# Patient Record
Sex: Female | Born: 1935 | Race: White | Hispanic: No | Marital: Married | Smoking: Never smoker
Health system: Southern US, Community
[De-identification: ages and names within clinical notes are randomized; demographics above are authoritative.]

## PROBLEM LIST (undated history)

## (undated) DIAGNOSIS — I509 Heart failure, unspecified: Secondary | ICD-10-CM

## (undated) DIAGNOSIS — I10 Essential (primary) hypertension: Secondary | ICD-10-CM

## (undated) DIAGNOSIS — L03116 Cellulitis of left lower limb: Secondary | ICD-10-CM

## (undated) DIAGNOSIS — L02416 Cutaneous abscess of left lower limb: Secondary | ICD-10-CM

## (undated) DIAGNOSIS — J449 Chronic obstructive pulmonary disease, unspecified: Secondary | ICD-10-CM

## (undated) DIAGNOSIS — E785 Hyperlipidemia, unspecified: Secondary | ICD-10-CM

## (undated) DIAGNOSIS — H409 Unspecified glaucoma: Secondary | ICD-10-CM

## (undated) HISTORY — PX: BACK SURGERY: SHX140

## (undated) HISTORY — PX: OTHER SURGICAL HISTORY: SHX169

---

## 1997-04-27 HISTORY — PX: SHOULDER ARTHROSCOPY WITH ROTATOR CUFF REPAIR: SHX5685

## 1998-04-27 HISTORY — PX: CHOLECYSTECTOMY: SHX55

## 1999-07-29 ENCOUNTER — Other Ambulatory Visit: Admission: RE | Admit: 1999-07-29 | Discharge: 1999-07-29 | Payer: Self-pay | Admitting: Internal Medicine

## 1999-11-10 ENCOUNTER — Emergency Department (HOSPITAL_COMMUNITY): Admission: EM | Admit: 1999-11-10 | Discharge: 1999-11-10 | Payer: Self-pay | Admitting: Podiatry

## 1999-11-10 ENCOUNTER — Encounter: Payer: Self-pay | Admitting: *Deleted

## 1999-11-20 ENCOUNTER — Encounter: Payer: Self-pay | Admitting: General Surgery

## 1999-11-24 ENCOUNTER — Ambulatory Visit (HOSPITAL_COMMUNITY): Admission: RE | Admit: 1999-11-24 | Discharge: 1999-11-25 | Payer: Self-pay | Admitting: General Surgery

## 1999-11-24 ENCOUNTER — Encounter: Payer: Self-pay | Admitting: General Surgery

## 2000-08-12 ENCOUNTER — Other Ambulatory Visit: Admission: RE | Admit: 2000-08-12 | Discharge: 2000-08-12 | Payer: Self-pay | Admitting: Internal Medicine

## 2001-11-25 ENCOUNTER — Other Ambulatory Visit: Admission: RE | Admit: 2001-11-25 | Discharge: 2001-11-25 | Payer: Self-pay | Admitting: Internal Medicine

## 2004-03-04 ENCOUNTER — Other Ambulatory Visit: Admission: RE | Admit: 2004-03-04 | Discharge: 2004-03-04 | Payer: Self-pay | Admitting: Internal Medicine

## 2004-03-17 ENCOUNTER — Ambulatory Visit (HOSPITAL_COMMUNITY): Admission: RE | Admit: 2004-03-17 | Discharge: 2004-03-17 | Payer: Self-pay | Admitting: Internal Medicine

## 2004-06-23 ENCOUNTER — Encounter: Admission: RE | Admit: 2004-06-23 | Discharge: 2004-06-23 | Payer: Self-pay | Admitting: *Deleted

## 2004-06-27 ENCOUNTER — Ambulatory Visit (HOSPITAL_BASED_OUTPATIENT_CLINIC_OR_DEPARTMENT_OTHER): Admission: RE | Admit: 2004-06-27 | Discharge: 2004-06-27 | Payer: Self-pay | Admitting: *Deleted

## 2004-06-27 ENCOUNTER — Ambulatory Visit (HOSPITAL_COMMUNITY): Admission: RE | Admit: 2004-06-27 | Discharge: 2004-06-27 | Payer: Self-pay | Admitting: *Deleted

## 2004-06-27 ENCOUNTER — Encounter (INDEPENDENT_AMBULATORY_CARE_PROVIDER_SITE_OTHER): Payer: Self-pay | Admitting: Specialist

## 2004-07-14 ENCOUNTER — Encounter: Admission: RE | Admit: 2004-07-14 | Discharge: 2004-10-12 | Payer: Self-pay | Admitting: Surgery

## 2005-04-27 HISTORY — PX: HERNIA REPAIR: SHX51

## 2005-05-22 ENCOUNTER — Encounter: Admission: RE | Admit: 2005-05-22 | Discharge: 2005-05-22 | Payer: Self-pay | Admitting: Internal Medicine

## 2005-06-03 ENCOUNTER — Encounter: Admission: RE | Admit: 2005-06-03 | Discharge: 2005-06-03 | Payer: Self-pay | Admitting: Internal Medicine

## 2007-05-24 ENCOUNTER — Encounter: Admission: RE | Admit: 2007-05-24 | Discharge: 2007-05-24 | Payer: Self-pay | Admitting: Internal Medicine

## 2007-08-15 ENCOUNTER — Ambulatory Visit (HOSPITAL_COMMUNITY): Admission: RE | Admit: 2007-08-15 | Discharge: 2007-08-15 | Payer: Self-pay | Admitting: Gastroenterology

## 2008-04-13 ENCOUNTER — Ambulatory Visit (HOSPITAL_COMMUNITY): Admission: RE | Admit: 2008-04-13 | Discharge: 2008-04-15 | Payer: Self-pay | Admitting: General Surgery

## 2008-12-05 ENCOUNTER — Encounter: Admission: RE | Admit: 2008-12-05 | Discharge: 2008-12-05 | Payer: Self-pay | Admitting: Internal Medicine

## 2010-09-09 NOTE — Op Note (Signed)
[name], [name]               ACCOUNT NO.:  [id number]   MEDICAL RECORD NO.:  [id number]          PATIENT TYPE:  AMB   LOCATION:  ENDO                         FACILITY:  [facility]   PHYSICIAN:  Anselmo Rod, M.D.  DATE OF BIRTH:  10/09/1930   DATE OF PROCEDURE:  08/15/2007  DATE OF DISCHARGE:  08/15/2007                               OPERATIVE REPORT   PROCEDURE PERFORMED:  Colonoscopy with snare polypectomy x 1.   ENDOSCOPIST:  Anselmo Rod, M.D.   INSTRUMENT USED:  Pentax video colonoscope.   INDICATIONS FOR PROCEDURE:  A 77 year old white female undergoing a  screening colonoscopy to rule out colonic polyps, masses, etc. The  patient is awaiting surgery for an umbilical hernia repair and therefore  preoperative colonoscopy is being done.   PREPROCEDURE PREPARATION:  Informed consent was procured from the  patient.  The patient fasted for 8 hours prior to the procedure and  prepped with a bottle of magnesium citrate and a gallon of NuLYTELY the  night prior to procedure.  Risks and benefits of the procedure including  a 10% missed rate of cancer and polyps were discussed with the patient  as well.   PREPROCEDURE PHYSICAL:  The patient had stable vital signs.  NECK:  Supple.  CHEST:  Clear to auscultation.  S1, S2,regular.  ABDOMEN:  Soft, morbidly obese, with a large reducible umbilical hernia  present.  Normal abdominal bowel sounds appreciated, no evidence of  hepatosplenomegaly.   DESCRIPTION OF THE PROCEDURE:  The patient was placed in the left  lateral decubitus position and sedated with 100 mcg of Fentanyl and 10  mg of Versed given intravenously in slow incremental doses.  Once the  patient was adequately sedated and maintained on low-flow oxygen and  continuous cardiac monitoring, the Pentax video colonoscope was advanced  from the rectum to the hepatic flexure with extreme difficulty because  of the patient's body habitus.  The patient's position was changed  from  the left lateral to the supine in the right lateral position with gentle  application of abdominal pressure to reach the hepatic flexure.  Scattered diverticulosis was noted.  A lobulated polyp was snared from  the rectum at the anal verge.  In spite of multiple maneuvers, I could  not reach the cecum.  There was some stool in the right colon as well.  The patient tolerated the procedure well without immediate  complications.   IMPRESSION:  1. Small lobulated polyp snared from the rectum at the anal verge.  2..  Scattered diverticulosis.  1. Procedure aborted at the hepatic flexure.  The right colon not      visualized.   RECOMMENDATIONS:  1. Await pathology results.  2. Avoid all nonsteroidals including aspirin for the next 2 weeks.  3. Repeat colonoscopy within the next 6 months to 1 year after the      patient has had umbilical hernia repair.  4. Outpatient follow-up as need arises in the future.  Brochures on      diverticulosis and a high-fiber diet have been given to the patient  for education.      Anselmo Rod, M.D.  Electronically Signed     JNM/MEDQ  D:  08/18/2007  T:  08/18/2007  Job:  [id number]   cc:   Massie Maroon, MD  Fax: [phone]   Lendon Colonel, MD   Cherylynn Ridges, M.D.  [address], [address]  [place]  [place] [address]

## 2010-09-09 NOTE — Op Note (Signed)
NAMEDYMPHNA, WADLEY               ACCOUNT NO.:  [id number]   MEDICAL RECORD NO.:  [id number]          PATIENT TYPE:  OIB   LOCATION:  5128                         FACILITY:  [facility]   PHYSICIAN:  Cherylynn Ridges, M.D.    DATE OF BIRTH:  10/09/1930   DATE OF PROCEDURE:  04/13/2008  DATE OF DISCHARGE:                               OPERATIVE REPORT   PREOPERATIVE DIAGNOSIS:  Ventral hernia.   POSTOPERATIVE DIAGNOSIS:  5 x 10 cm ventral hernia.   PROCEDURE:  Laparoscopic ventral hernia repair with Proceed 15 x 20 cm  mesh.   SURGEON:  Cherylynn Ridges, MD   ASSISTANT:  Juanetta Gosling, MD   ANESTHESIA:  General endotracheal.   ESTIMATED BLOOD LOSS:  Less than 50 mL.   COMPLICATIONS:  None.   CONDITION:  Stable.   SPECIMENS:  No specimens were sent.   INDICATIONS FOR OPERATION:  The patient is a 77 year old with a postop  ventral hernia who comes in for repair.   FINDINGS:  The patient had a 5 x 10 cm defect with incarcerated omentum  which was reduced at the time of surgery.   OPERATION:  The patient was taken to the operating room and placed on  the table in supine position.  After an adequate general endotracheal  anesthetic was administered, she was prepped and draped in the usual  sterile manner exposing the entire abdomen.   A right upper quadrant transverse incision about 1.5 cm long was made  into the subcutaneous tissue.  Using an OptiVu at the [facility] cannula and  trocar with attached camera light source we were able to penetrate into  the peritoneal cavity safely, then insufflate into the peritoneal cavity  with carbon dioxide gas up to a maximal intra-abdominal pressure of 15  mmHg.  Once we were clearly in the peritoneal cavity, we were running  into some omentum in that area.  We placed a second cannula in the left  lower quadrant where we were able to use a dissector grasper and also in  the left midportion of the abdomen and in order to use a grasper to  dissect out and remove the omentum from the hernia defect.   We used a Art gallery manager where we sort of cauterized into the hernia  sac and then bluntly dissected out the omentum into the peritoneal  cavity with minimal bleeding.   Once we had completely reduced the omentum from the hernia defect, we  measured the size of the defect using a spinal needle at the edge of the  hernia fascial defect.  It measured approximately 5 x 10 cm in size and  we used a 15 x 20 cm piece of Proceed mesh with the cellulose coated  side on the abdominal side and a core side towards the abdominal wall.  We marked the mesh with a marking pen A, B, C and X.  A was at the upper  portion of the wound.  We placed simple stitches of old Novafil in the  mesh at the four main spots and then between each of  those.  We placed a  second stitch for a total of eight stitches throughout the mesh.   Once we had adequate orientation, we passed the mesh into the peritoneal  cavity through the left upper quadrant of the cannula site.  We then  retrieved the sutures using a suture retriever through the marked spots  on eight areas equally spaced on the abdominal wall.  After we had all  sutures pulled up to the abdominal wall, we pulled it up and tied down  the stitch and then cut them into the subcutaneous tissue.  It was a  little bit loose along the inferior portion, however, we were able to  tack this down using a tacker.  A total of about 35-40 packs were placed  in the mesh to circumferentially tack down tensely.   Once we had adequate position of the mesh with good apposition to the  hernia defect, we removed all cannulas, aspirated all gas and irrigated  with small amount of saline.   Once all cannulas were removed, we closed the skin at the left upper  quadrant site using a running subcuticular stitch of 4-0 Vicryl.  Marcaine was injected at all sites.  A total of about just 15 sites were  used.  Most of them  were closed with Dermabond with the exception of the  left upper quadrant 11-mm trocar site.  We then placed Steri-Strips and  Tegaderm to all incisions.  Abdominal binder was placed in the patient  prior to awakening.  All counts were correct.      Cherylynn Ridges, M.D.  Electronically Signed     JOW/MEDQ  D:  04/13/2008  T:  04/13/2008  Job:  [id number]   cc:   Anselmo Rod, M.D.

## 2010-09-12 NOTE — H&P (Signed)
[place]. [facility]  Patient:    [name], [name]                        MRN: [id number] Attending:  Jimmye Norman, M.D.                         History and Physical  ADDENDUM:  Her first symptoms occurred approximately a month ago while in [place], where she lived, and when she went in to the emergency room she was found to have gallstones.  She has been asymptomatic until the day prior to when she came in to my office for evaluation.  She has had no jaundice, no constipation, no diarrhea, no abnormal stools or bowel movements, and no abnormal urine.  PAST MEDICAL HISTORY:  Significant for hypertension and diabetes.  MEDICATIONS:  She takes several medications including Glucovan b.i.d.; Avapro 1 q.d.; and Indapamide 1 q.d. also.  She is also taking Ciprofloxacin and Vicodin at the time she was evaluated in my office on the 17th.  ALLERGIES:  She is intolerant to CODEINE.  She has vomiting.  She has no other known drug allergies.  PHYSICAL EXAMINATION:  VITAL SIGNS:  Her blood pressure was 130/80, pulse was 80, respirations 16. She was afebrile with a temperature of 97.9.  GENERAL:  She was not jaundice.  HEENT:  She was normocephalic and atraumatic.  She is anicteric.  NECK:  Supple.  She has no palpable masses nor bruits.  CHEST:  Clear to auscultation.  CARDIOVASCULAR:  Regular rhythm and rate with no murmurs, no gallops, no lifts, or heaves.  ABDOMEN:  Mildly distended but had good bowel sounds.  Nontender completely throughout the abdomen with no rebound or guarding.  RECTAL:  Deferred.  PELVIC:  Deferred.  LABORATORY DATA:  Will be done preoperatively and from her visit admission [facility], her hematocrit was 40%.  Her lipase was over 2000 at 2216.  It is not known why the patient was not admitted at that time for cholecystectomy.  No other lab studies are noted at this time.  EKG was normal.  IMPRESSION:  Symptomatic  cholelithiasis, possible gallstone pancreatitis in a diabetic female with hypertension.  PLAN:  The plan is to perform a laparoscopic cholecystectomy, possible open procedure based on the patients history of pain in the epigastrium in right upper quadrant.  She will get preoperative antibiotics. DD:  11/24/99 TD:  11/24/99 Job: [id number]

## 2010-09-12 NOTE — Op Note (Signed)
[place]. [facility]  Patient:    [name]                       MRN: [id number] Proc. Date: 11/24/99 Adm. Date:  [id number] Disc. Date: [id number] Attending:  Cherylynn Ridges                           Operative Report  PREOPERATIVE DIAGNOSIS:   Symptomatic cholelithiasis and gallstone pancreatitis.  POSTOPERATIVE DIAGNOSIS:  Symptomatic cholelithiasis and gallstone pancreatitis.  PROCEDURE:  Laparoscopic cholecystectomy with intraoperative cholangiogram.  SURGEON:  Jimmye Norman, M.D.  ASSISTANT:  Zigmund Daniel, M.D.  ANESTHESIA:  General endotracheal anesthesia.  ESTIMATED BLOOD LOSS:  Less than 30 cc.  COMPLICATIONS:  None.  FINDINGS:  Cholangiogram showed some slow flow into the duodenum but it did flow with no obvious stone as a filling defect although there was a lucency in the distal duct which may have represented some inflammation of the distal duct in surgery.  Low flow into the pancreatic duct.  No obvious filling defect.  The patient had pigmentous stones on opening the gallbladder.  SPECIMEN:  Gallbladder.  INDICATIONS:  The patient is a 77 year-old female with a month history of abdominal pain now in the right upper quadrant and previously in the upper mid-back.  She now comes in for elective laparoscopic cholecystectomy.  PROCEDURE IN DETAIL:  The patient was taken to the operating room and placed on the operating room table in the supine position.  After an adequate endotracheal anesthetic was administered she was prepped and draped in the usual sterile fashion exposing the midline of the abdomen.  A supraumbilical curvilinear incision was made using a #11 blade and taken down to the midline fascia.  It was noted that the patient had a small umbilical hernia defect which was repaired along with closure of the supraumbilical site.  The Veress needle was passed through the peritoneum at the umbilical hernia defect  and subsequently confirmed to be in adequate position using the saline test.  Once this was done, carbon dioxide insufflation was instilled into the peritoneal cavity up to a maximum intra-abdominal pressure of 15 mmHg.  Once this was done the patient was placed in more steep reverse Trendelenburg position.  Two right costal margin 5 mm cannulas and a subxiphoid 10 - 11 mm cannula were passed into the peritoneal cavity under direct visualization.  The supraumbilical cannula was passed initially and confirmed to be in position with the laparoscope as attached camera light source.  With all cannulas in place, the patient was placed in more steep reverse Trendelenburg position. Her left side was tilted down.  The dissection was begun.  There were adhesions of the right lateral side of the gallbladder to the liver bed which were torn during retraction into the right upper quadrant which caused a small amount of bleeding from the liver surface in that area but this was controllable with the electrocautery.  We retracted the gallbladder into the right upper quadrant and the infundibulum was grasped with a second grasper and then the peritoneum overlying the triangle of Calot and the hepatoduodenal triangle using dissectors.  We bluntly dissected out the cystic duct and cystic artery in this area and encircled them.  We placed a proximal clip on the cystic duct with the exception of the cholecystoduchotomy for the cholangiogram.  The cystic artery was dissected out with an  endoclip proximally and distally with minimal difficulty.  Through the cholecystoduchotomy, a catheter was passed into the peritoneal cavity after passing through the anterior abdominal wall using a #14 gauge needle.  Using the catheter, the cholangiogram which was described previously was done which showed flow into the duodenum although slow and reflux into the pancreatic duct.  There was no obvious stone as a filling  defect.  Once the cholangiogram was done, we removed the catheter and the device was used to hold it in place.  We distally ligated the cystic duct, transected it, and then subsequently, after transecting the cystic artery with flips in between, dissected the gallbladder out of its bed with minimal difficulty. There was a rupture of the gallbladder during this process with spillage of clear bile but no stones.  Once the gallbladder was completely detached, we obtained adequate hemostasis using electrocautery.  We removed the gallbladder from the supraumbilical fascia with minimal difficulty.  We saw subsequently that there were pigmentous stones measuring 1 to 3 mm in size.  Once the gallbladder was removed, we irrigated the right upper lobe and the gallbladder bed with saline- up to a liter of saline was used.  There was minimal bleeding.  We let out the gas and all ports were removed from the suction catheter apparatus.  Once this was done, we removed our cannulas.  The supraumbilical fascia site was reapproximated using figure-of-eight stitch of 0 Vicryl.  This was passed on a UR-6 needle.  We closed all skin sites using running subcuticular stitch of 4-0 Vicryl.  Then 0.25% marcaine with epinephrine was injected at all incision sites.  Sterile Steri-Strips were applied to the wounds with Betadine ointment and a 2 x 2 dressing.  The needle and sponge count and instrument count were correct at the conclusion of the case. DD:  11/24/99 TD:  11/25/99 Job: [id number]

## 2010-09-12 NOTE — Discharge Summary (Signed)
[place]. [facility]  Patient:    [name]                       MRN: [id number] Adm. Date:  [id number] Disc. Date: [id number] Attending:  Cherylynn Ridges                           Discharge Summary  DISCHARGE DIAGNOSIS:  Symptomatic cholelithiasis with possible past history of gallstone pancreatitis.  DISCHARGE MEDICATIONS:  Vicodin 1-2 tablets q. 4h p.r.n. for pain.  DIET ON DISCHARGE:  ADA diet, as the was taking preoperatively.  She continue to take her preoperative medications, in addition, she is given Vicodin for pain control.  CONDITION ON DISCHARGE:  Stable.  DISCHARGE INSTRUCTIONS:  Activity level is unlimited.  She can ambulate and do other activities as tolerated.  FOLLOW-UP:  She is to follow-up to see me in 2 weeks.  Her wound should be allowed to remain covered for 2 days, and then she can remove the outer dressing and shower and pat her wounds dry.  BRIEF SUMMARY OF HOSPITAL COURSE:  The patient is a 77 year old female admitted for an elective laparoscopic cholecystectomy after having a gallstone attack in [place].  She had an intraoperative cholangiogram which showed some distal ductal slow-filling, no actual defect flow into the duodenum and normal anatomy otherwise.  She was discharged home after a laparoscopic cholecystectomy.  She is eating well, good bowel sounds, no fever and blood pressure well-controlled.  FOLLOW-UP:  She will follow-up to see me in 2 weeks time. DD:  11/25/99 TD:  11/25/99 Job: [id number]

## 2010-09-12 NOTE — Op Note (Signed)
NAMEHENNIE, [name]               ACCOUNT NO.:  [id number]   MEDICAL RECORD NO.:  [id number]          PATIENT TYPE:  AMB   LOCATION:  [facility]                         FACILITY:  [facility]   PHYSICIAN:  Pershing Cox, M.D.DATE OF BIRTH:  18-Sep-1930   DATE OF PROCEDURE:  DATE OF DISCHARGE:  06/27/2004                                 OPERATIVE REPORT   PREOPERATIVE DIAGNOSIS:  Abnormal endometrium on sonogram with normal  endometrial biopsy.   POSTOPERATIVE DIAGNOSIS:  Solitary endometrial polyp.   OPERATION/PROCEDURE:  1.  Examination under anesthesia.  2.  Fractional dilatation and curettage.  3.  Hysteroscopy.   SURGEON:  Pershing Cox, M.D.   ASSISTANT:  None.   ANESTHESIA:  General with LMA and paracervical block.   INDICATIONS:  This patient is 77 years old and was referred by Dr. Lendell Caprice  to my office after she had an abnormal sonogram performed at [facility].  This showed an endometrial thickening, 13 mm in  size. A GYN consult was recommended and she came to my office and  endometrial biopsy was performed which showed normal endometrium and  fragments of an endometrial polyp with simple hyperplasia.  No atypia.  For  this reason, the patient was brought to the operating room today for the  [facility], hysteroscopy.   OPERATIVE FINDINGS:  Examination under anesthesia shows the uterus to be  anteverted.  No adnexal masses palpated.  The endometrial canal sounds to a  depth of 7 cm.  There was a solitary endometrial polyp found on the mid  right wall of the endometrium.  The remainder of the endometrium was  atrophic.   DESCRIPTION OF PROCEDURE:  [name] was brought to the operating room  with an IV in place.  She received 1 g of Ancef in the holding area in  preparation for the surgery.  She had been counseled regarding the risk of  perforation and infection and accepted the risks of the procedure.   The patient was taken to the operating  room where supine on the OR table,  LMA was introduced and used for anesthesia throughout the procedure.  The  patient was then placed into Allen stirrups and positioned at the end of the  bed.  She was prepped with a solution of Betadine.  Bivalve speculum was  inserted into the vagina and the cervix was grasped with a single-tooth  tenaculum.  Marcaine 0.25% was injected into the cervical tissues at the 3,  4, 7, and 8 positions.  Kevorkian curet was used to obtain endocervical  curettings.  The uterus sound then passed to a depth of 7 cm.  Serial Pratt  dilators were used to dilate to size 25 and the diagnostic scope was  introduced.  Using through-and-through sorbitol irrigation, the cavity was  visualized.  A solitary polyp was seen in the midst of otherwise atrophic  endometrium and a decision was made to use a small, sharp curet to try to  curet out the pieces rather than further dilating the cervix and using the  resectoscope with its possible risks.  Small sharp curet was introduced into the cavity and using brisk curettage  of the right wall, then serial curettage of the remaining wall, fragments of  the polyp were retrieved onto a Telfa.  The diagnostic hysteroscope was  introduced. All of the polyp had been removed.  The Meigs curet was then  used to serially curet the remaining walls onto another Telfa. The patient  tolerated the procedure without difficulty and was taken to the recovery  room in good condition.   FINAL DIAGNOSIS:  Endometrial polyp.      MAJ/MEDQ  D:  06/27/2004  T:  06/27/2004  Job:  [id number]   cc:   Janae Bridgeman. Eloise Harman., M.D.  [address]  [place]  [place] [address]  Fax: [phone]

## 2010-10-09 ENCOUNTER — Other Ambulatory Visit (INDEPENDENT_AMBULATORY_CARE_PROVIDER_SITE_OTHER): Payer: Self-pay | Admitting: General Surgery

## 2010-10-31 ENCOUNTER — Other Ambulatory Visit: Payer: Self-pay

## 2010-11-14 ENCOUNTER — Ambulatory Visit
Admission: RE | Admit: 2010-11-14 | Discharge: 2010-11-14 | Disposition: A | Discharge status: Home / Self Care | Payer: Medicare Other | Source: Ambulatory Visit | Attending: General Surgery | Admitting: General Surgery

## 2010-11-14 MED ORDER — IOHEXOL 300 MG/ML  SOLN
125.0000 mL | Freq: Once | INTRAMUSCULAR | Status: AC | PRN
  Administered 2010-11-14: 125 mL via INTRAVENOUS

## 2010-12-05 ENCOUNTER — Emergency Department (HOSPITAL_COMMUNITY): Payer: Medicare Other

## 2010-12-05 ENCOUNTER — Encounter (HOSPITAL_COMMUNITY): Payer: Self-pay | Admitting: Radiology

## 2010-12-05 ENCOUNTER — Observation Stay (HOSPITAL_COMMUNITY)
Admission: EM | Admit: 2010-12-05 | Discharge: 2010-12-06 | Disposition: A | Discharge status: Home / Self Care | Payer: Medicare Other | Attending: Family Medicine | Admitting: Family Medicine

## 2010-12-05 DIAGNOSIS — N183 Chronic kidney disease, stage 3 unspecified: Secondary | ICD-10-CM | POA: Insufficient documentation

## 2010-12-05 DIAGNOSIS — D696 Thrombocytopenia, unspecified: Secondary | ICD-10-CM

## 2010-12-05 DIAGNOSIS — R0789 Other chest pain: Principal | ICD-10-CM | POA: Insufficient documentation

## 2010-12-05 DIAGNOSIS — E119 Type 2 diabetes mellitus without complications: Secondary | ICD-10-CM | POA: Insufficient documentation

## 2010-12-05 DIAGNOSIS — M25519 Pain in unspecified shoulder: Secondary | ICD-10-CM | POA: Insufficient documentation

## 2010-12-05 DIAGNOSIS — M6282 Rhabdomyolysis: Secondary | ICD-10-CM | POA: Insufficient documentation

## 2010-12-05 DIAGNOSIS — I129 Hypertensive chronic kidney disease with stage 1 through stage 4 chronic kidney disease, or unspecified chronic kidney disease: Secondary | ICD-10-CM | POA: Insufficient documentation

## 2010-12-05 DIAGNOSIS — E785 Hyperlipidemia, unspecified: Secondary | ICD-10-CM | POA: Insufficient documentation

## 2010-12-05 HISTORY — DX: Hyperlipidemia, unspecified: E78.5

## 2010-12-05 HISTORY — DX: Essential (primary) hypertension: I10

## 2010-12-05 LAB — GLUCOSE, CAPILLARY

## 2010-12-05 LAB — CK TOTAL AND CKMB (NOT AT ARMC)
Total CK: 81 U/L (ref 7–177)
Total CK: 112 U/L (ref 7–177)

## 2010-12-05 LAB — URINALYSIS, ROUTINE W REFLEX MICROSCOPIC
Bilirubin Urine: NEGATIVE
Hgb urine dipstick: NEGATIVE
Specific Gravity, Urine: 1.03 (ref 1.005–1.030)
Urobilinogen, UA: 0.2 mg/dL (ref 0.0–1.0)
pH: 5.5 (ref 5.0–8.0)

## 2010-12-05 LAB — CBC
MCH: 29.5 pg (ref 26.0–34.0)
Platelets: 46 10*3/uL — ABNORMAL LOW (ref 150–400)
RBC: 4.31 MIL/uL (ref 3.87–5.11)
WBC: 8.1 10*3/uL (ref 4.0–10.5)

## 2010-12-05 LAB — POCT I-STAT, CHEM 8
HCT: 39 % (ref 36.0–46.0)
Hemoglobin: 13.3 g/dL (ref 12.0–15.0)
Potassium: 4.6 mEq/L (ref 3.5–5.1)
Sodium: 133 mEq/L — ABNORMAL LOW (ref 135–145)

## 2010-12-05 LAB — DIFFERENTIAL
Basophils Relative: 0 % (ref 0–1)
Eosinophils Absolute: 0.2 10*3/uL (ref 0.0–0.7)
Neutrophils Relative %: 75 % (ref 43–77)

## 2010-12-05 LAB — TROPONIN I
Troponin I: <0.3 ng/mL (ref <0.30)
Troponin I: <0.3 ng/mL (ref <0.30)

## 2010-12-05 LAB — BASIC METABOLIC PANEL
Chloride: 95 mEq/L — ABNORMAL LOW (ref 96–112)
Creatinine, Ser: 1.08 mg/dL (ref 0.50–1.10)
GFR calc Af Amer: 59 mL/min — ABNORMAL LOW (ref >60)

## 2010-12-05 LAB — POCT I-STAT TROPONIN I: Troponin i, poc: 0 ng/mL (ref 0.00–0.08)

## 2010-12-05 LAB — D-DIMER, QUANTITATIVE: D-Dimer, Quant: 0.63 ug/mL-FEU — ABNORMAL HIGH (ref 0.00–0.48)

## 2010-12-05 LAB — PLATELET COUNT: Platelets: UNDETERMINED 10*3/uL (ref 150–400)

## 2010-12-05 MED ORDER — IOHEXOL 300 MG/ML  SOLN: 85.0000 mL | Freq: Once | INTRAMUSCULAR | Status: AC | PRN

## 2010-12-06 LAB — GLUCOSE, CAPILLARY
Glucose-Capillary: 160 mg/dL — ABNORMAL HIGH (ref 70–99)
Glucose-Capillary: 157 mg/dL — ABNORMAL HIGH (ref 70–99)

## 2010-12-06 LAB — TROPONIN I: Troponin I: <0.3 ng/mL (ref <0.30)

## 2010-12-06 LAB — HEMOGLOBIN A1C: Mean Plasma Glucose: 171 mg/dL — ABNORMAL HIGH (ref <117)

## 2010-12-06 LAB — CBC
HCT: 33.6 % — ABNORMAL LOW (ref 36.0–46.0)
Hemoglobin: 10.8 g/dL — ABNORMAL LOW (ref 12.0–15.0)
MCHC: 32.1 g/dL (ref 30.0–36.0)
RBC: 3.76 MIL/uL — ABNORMAL LOW (ref 3.87–5.11)

## 2010-12-06 LAB — CK TOTAL AND CKMB (NOT AT ARMC)
Relative Index: 2.7 — ABNORMAL HIGH (ref 0.0–2.5)
Total CK: 100 U/L (ref 7–177)

## 2010-12-09 LAB — SAVE SMEAR

## 2010-12-10 NOTE — Consult Note (Signed)
NAMEEYLA, TALLON NO.:  [id number]  MEDICAL RECORD NO.:  [id number]  LOCATION:  4713                         FACILITY:  [facility]  PHYSICIAN:  Exie Parody, M.D.        DATE OF BIRTH:  10/09/1930  DATE OF CONSULTATION:  12/05/2010 DATE OF DISCHARGE:                                CONSULTATION   CHIEF COMPLAINT:  Right shoulder blade pain.  REASON FOR CONSULTATION:  Thrombocytopenia.  HISTORY OF PRESENT ILLNESS:  [name] is a 77 year old Caucasian woman from [place], [place].  She claimed that she has had history of thrombocytopenia for many years.  About 15-20 years ago, she was evaluated with a bone marrow biopsy which was negative.  She was told that her thrombocytopnenia is benign.  She has been in her usual state of health until this morning at 4:00 a.m. when she has been having persistent right shoulder pain.  The pain is described as sharp and pressure and does not get worse with breathing or activity.  Workup included a chest x-ray, which I will review myself, which was negative for any acute finding except for intimated subcentimeter nodule.  She had a CT angio on the same day, was negative for PE.  However, this showed pulmonary nodule in right upper and lower lobe, subcentimeter.  She had a right shoulder imaging that was negative for anything except for mild degenerative changes in the shoulder.  Given the thrombocytopenia presentation, she was referred for my evaluation.  [name] was seen in the room by myself today.  She reported that she still has that discomfort in the right shoulder blade.  She denies any pain anywhere else especially an anterior or substernal chest pain.  She denies anorexia, weight loss, palpable lymph node swelling, drenching night sweats, severe headache, visual changes, nausea, vomiting, diaphoresis, dysphagia, odynophagia, and neck swelling.  She denies any hemoptysis, hematemesis, melena, hematochezia,  hematuria/rectal bleeding, skin rash, lower back pain, bowel and bladder incontinence. The rest of the 14-point review of system was negative.  PAST MEDICAL HISTORY: 1. Diabetes mellitus type 2. 2. Hypertension. 3. Hyperlipidemia. 4. Mild COPD.  PAST SURGICAL HISTORY:  Cholecystectomy, possible appendectomy.  She is not aware whether she had that or not.  She had a right rotator cuff repair 20 years ago and lower diskectomy.  CURRENT OUTPATIENT MEDICATIONS: 1. Victoza 0.6 mg daily subcu. 2. Micardis 80/25 one tablet p.o. daily. 3. Advil p.r.n. 4. Xalatan eyedrops. 5. Lipitor 10 mg p.o. at bedtime. 6. Glipizide 5 mg 1-2 tablets daily. 7. Metformin 1000 mg p.o. daily. 8. Actos 30 mg half a tablet p.o. daily.  ALLERGIES:  No known drug allergy.  SOCIAL HISTORY:  The patient used to work in [employer].  She denies history of alcohol or IV drug use, smoking.  She lives with her husband.  FAMILY HISTORY:  Father died at age 80 from natural cause, mother age 62 from unknown cause.  She has five brothers, all of whom are okay except for one with diabetes.  The patient denies any known family history of thrombocytopenia.  PHYSICAL EXAMINATION:  VITAL SIGNS:  Temperature was 97.8, heart rate 84, respiratory  rate 22, blood pressure 149/74, weight 110 kg.  ECOG performance of 0-1. GENERAL:  Mildly obese woman, in no acute distress. HEENT:  There is no scleral icterus.  There is no oral pharyngeal lesion. NECK:  Supple. LYMPHATICS:  Negative for cervical, supraclavicular, or axillary adenopathy. LUNGS:  Clear bilaterally without wheezing or crackles. CARDIOVASCULAR:  Regular rate and rhythm.  S1 and S2 without murmur, rub, or gallop. ABDOMEN:  Soft, mildly obese, nontender, nondistended with hepatosplenomegaly.  There was no pedal edema. NEUROLOGIC:  Nonfocal. SKIN:  Negative for any petechiae, ecchymosis. MUSCULOSKELETAL:  Right showed a blade mildly tender to palpation, however,  without any skin rash, crepitus, erythema, purulent discharge, open wound.  LABORATORY DATA:  WBC 8.1, hemoglobin 13.3, platelet count of 46,000. Differential was normal.  Creatinine 1.2.     I personally reviewedher blood smear today, which showed numerous  platelet clumps.  There were no giant platelets.  There was no  shistocytosis, spherocytosis,rouleaux formation, target cell, teardrop  cells.  There were no blasts.  ASSESSMENT AND PLAN: 1. Right shoulder blade pain mostly musculoskeletal. Management for  further work up per primary team.  2. Diabetes mellitus type 2.  She is on glipizide, metformin, Actos     per PCP. 3. Hypertension.  She is on Micardis with good pressure control. 4. Hyperlipidemia.  She is on statin. 5. Chronic thrombocytopenia, unclear duration:  I discussed with Ms.     Small that most likely this is a spurious thrombocytopenia from     platelet clumps.  This was a evaluated in the past per her report.     To confirm this, I will send for platelet counts by citrate tube.  I     discussed with her that platelet clumps in vitro has no clinical     significance. 6. A subcentimeter lung nodule:  I recommended that the patient be     followed with her PCP and consider a followup a CT scan of her     chest in 1 year to ensure stability of these subcentimeter nodules.     Exie Parody, M.D.     HTH/MEDQ  D:  12/05/2010  T:  12/06/2010  Job:  [id number]  Electronically Signed by Jethro Bolus MD on 12/10/2010 06:49:27 PM

## 2010-12-24 NOTE — Discharge Summary (Signed)
  NAMEKIMIA, FINAN NO.:  [id number]  MEDICAL RECORD NO.:  [id number]  LOCATION:  4713                         FACILITY:  [facility]  PHYSICIAN:  Mauro Kaufmann, MD         DATE OF BIRTH:  10/09/1930  DATE OF ADMISSION:  12/05/2010 DATE OF DISCHARGE:                              DISCHARGE SUMMARY   ADDENDUM  The patient walked in the hallway and checked her O2 sats which has been good 95%, so at this time the patient is not hypoxic, at time of discharge does not have any dyspnea on exertion and is stable for discharge.  This was at 95% on room air after the exertion.  The patient will be given the prescription for physical therapy as outpatient for the right shoulder pain.     Mauro Kaufmann, MD     GL/MEDQ  D:  12/06/2010  T:  12/06/2010  Job:  [id number]  cc:   Dr. Selena Batten  Electronically Signed by Sibyl Parr LAMA  on 12/24/2010 07:30:41 AM

## 2010-12-24 NOTE — Discharge Summary (Signed)
[name], [name] NO.:  [id number]  MEDICAL RECORD NO.:  [id number]  LOCATION:  4713                         FACILITY:  [facility]  PHYSICIAN:  Mauro Kaufmann, MD         DATE OF BIRTH:  10/09/1930  DATE OF ADMISSION:  12/05/2010 DATE OF DISCHARGE:  12/06/2010                              DISCHARGE SUMMARY   ADMISSION DIAGNOSES: 1. Right shoulder pain likely musculoskeletal. 2. History of right shoulder cuff repair. 3. Thrombocytopenia. 4. Diabetes mellitus. 5. Hypertension. 6. Hyperlipidemia.  DISCHARGE DIAGNOSES: 1. Atypical chest pain, resolved. 2. Right  shoulder pain likely musculoskeletal. 3. Chronic thrombocytopenia. 4. Diabetes mellitus. 5. Hypertension. 6. Chronic kidney disease.  TESTS PERFORMED DURING THE HOSPITAL STAY: 1. Chest x-ray on August 10, which showed no definite evidence of     acute cardiopulmonary disease on the AP exam. 2. Right shoulder x-ray showed mild degenerative changes in the right     shoulder, postsurgical changes in right shoulder. 3. CT angio of the chest was done yesterday which showed no evidence     of pulmonary embolism.  No thoracic aortic dissection.  There was     indeterminate 4 mm pulmonary nodule in the right upper and lower     lobes.  A follow-up recommend CT in 1 year.  CONSULTS OBTAINED DURING HOSPITAL STAY:  Hematology/Oncology consultation.  BRIEF HISTORY AND PHYSICAL:  This is a 77 year old female, who came to the ER with right shoulder pain, which awakened from sleep.  The patient also had increasing D-dimer and a CT of angiogram did not show any evidence of PE.  The patient was also found to have chronic thrombocytopenia.  She was admitted to the hospital for further evaluation to make sure that there was no cardiac cause of the pain.  BRIEF HOSPITAL COURSE: 1. Right shoulder pain.  The right shoulder pain at this time is     because of the musculoskeletal as the cardiac enzymes x3 negative  and also the patient has had shoulder surgery in the past.  She     will continue to take Tylenol at home, and if the pain does not     subside the next few days, she will call the primary care physician     and she will benefit from Orthopedics consultations as an     outpatient. 2. Chronic thrombocytopenia.  The patient was seen by Dr. Gaylyn Rong on     hematologist/oncologist in the hospital, who ordered to recheck the     platelets on the citrate tube and the platelets again showing     clumped, but within adequate number.  The patient does not need to     follow up with hematology at this point, and the patient is stable     at this time. 3. The platelet count is adequate though it is spuriously showing     thrombocytopenia and due to the platelet clumps. 4. Hypertension.  The patient will continue to use her medications. 5. Mild hypoxia, resolved.  The patient had normal O2 sats in the     hospital, which was more 92% on August 11 at 10:38 a.m.  Her O2     sats were recorded as 88% on room air.  We rechecked and the     patient's O2 sats right now is 90%.  The patient has no symptoms at     all.  The only medication she was getting was morphine, she got     yesterday, and also she was on p.r.n. hydrocodone.  I am not going     to give the patient any hydrocodone at this time.  Her CT angio has     been negative.  Cardiac enzymes had been negative, so most likely     the hypoxemia which could be secondary to the patient's right     shoulder pain, limiting her breath intake, causing shallow     breathing and also the effect of morphine.  If the patient     continues to have breathing difficulties, she probably would     benefit from a pulmonary evaluation as outpatient to check for     underlying sleep apnea.  The patient also has a history of mild     COPD.  At this time, I do not think the patient is requiring oxygen     at home.  At this time, I do not think the patient will requiring      any oxygen at home. 6. Pulmonary nodule.  The patient will need a repeat CT scan in 1 year     as per her primary care physician to check for the pulmonary     nodule.  MEDICATION ON DISCHARGE: 1. Tylenol 650 q.4 h p.r.n. as needed. 2. Actos 30 mg half tablet p.o. daily. 3. Glipizide 5 mg one to two tablets by mouth daily. 4. Lipitor 10 mg p.o. daily. 5. Metformin 1000 mg p.o. daily. 6. Micardis HCT one tablet p.o. daily. 7. Victoza 6 mg subcutaneously daily. 8. Xalatan one drop each eye at bedtime. 9. Stop taking Advil.     Mauro Kaufmann, MD     GL/MEDQ  D:  12/06/2010  T:  12/06/2010  Job:  [id number]  cc:   Dr. Selena Batten  Electronically Signed by Sibyl Parr LAMA  on 12/24/2010 07:30:33 AM

## 2011-01-29 LAB — GLUCOSE, CAPILLARY
Glucose-Capillary: 134 mg/dL — ABNORMAL HIGH (ref 70–99)
Glucose-Capillary: 135 mg/dL — ABNORMAL HIGH (ref 70–99)
Glucose-Capillary: 165 mg/dL — ABNORMAL HIGH (ref 70–99)
Glucose-Capillary: 218 mg/dL — ABNORMAL HIGH (ref 70–99)

## 2011-01-29 LAB — COMPREHENSIVE METABOLIC PANEL
ALT: 18 U/L (ref 0–35)
AST: 16 U/L (ref 0–37)
Albumin: 3.8 g/dL (ref 3.5–5.2)
Alkaline Phosphatase: 88 U/L (ref 39–117)
CO2: 33 mEq/L — ABNORMAL HIGH (ref 19–32)
Chloride: 99 mEq/L (ref 96–112)
GFR calc Af Amer: >60 mL/min (ref >60)
GFR calc non Af Amer: 51 mL/min — ABNORMAL LOW (ref >60)
Potassium: 4.1 mEq/L (ref 3.5–5.1)
Sodium: 136 mEq/L (ref 135–145)
Total Bilirubin: 0.7 mg/dL (ref 0.3–1.2)

## 2011-01-29 LAB — DIFFERENTIAL
Band Neutrophils: 0 % (ref 0–10)
Blasts: 0 %
Metamyelocytes Relative: 0 %
Monocytes Absolute: 0.5 10*3/uL (ref 0.1–1.0)
Monocytes Relative: 7 % (ref 3–12)
Myelocytes: 0 %
Promyelocytes Absolute: 0 %
nRBC: 0 /100 WBC

## 2011-01-29 LAB — CBC
MCV: 90.6 fL (ref 78.0–100.0)
RBC: 4.08 MIL/uL (ref 3.87–5.11)
WBC: 6.9 10*3/uL (ref 4.0–10.5)

## 2011-02-03 NOTE — H&P (Signed)
NAMEDONNETTE, MACMULLEN NO.:  [id number]  MEDICAL RECORD NO.:  [id number]  LOCATION:  4713                         FACILITY:  [facility]  PHYSICIAN:  Baltazar Najjar, MD     DATE OF BIRTH:  10/09/1930  DATE OF ADMISSION:  12/05/2010 DATE OF DISCHARGE:                             HISTORY & PHYSICAL   PRIMARY CARE PHYSICIAN:  Massie Maroon, MD  CODE STATUS:  Full code.  CHIEF COMPLAINT:  Right shoulder pain.  HISTORY OF PRESENT ILLNESS:  [name] is a 77 year old Caucasian woman who presented to the ER today with right shoulder pain that appeared early this morning, awakened her from sleep.  As per her, she described it as sharp, aching pain, rated it at the highest of 5/10, intermittent, radiating to her right shoulder blade.  The patient stated that she did not notice any change with movement of her shoulder.  She denies any shortness of breath or chest pain.  Denies any diaphoresis, nausea, or dizziness.  Denies any cough or shortness of breath.  In the ED, the patient was found to have mildly elevated D-dimer.  She had a CT angiogram of the chest done that did not show any evidence of PE.  We are asked to admit the patient for rule out acute coronary syndrome by the ED physician.  PAST MEDICAL HISTORY: 1. Hypertension. 2. Hyperlipidemia. 3. Diabetes. 4. COPD.  PAST SURGICAL HISTORY:  History of right rotator cuff repair about 20 years ago.  SOCIAL HISTORY:  Lives with family, with her husband.  Never smoked or drink.  No history of drug use.  ALLERGIES:  No known drug allergies.  HOME MEDICATIONS: 1. Victoza 0.6 mg daily subcutaneously. 2. Micardis 80/25 mg 1 tablet p.o. daily. 3. Advil 200 mg p.o. daily as needed. 4. Xalatan 0.005% ophthalmic each eye 1 drop nightly. 5. Lipitor 10 mg 1 tablet daily. 6. Glipizide 5 mg 1-2 tablets daily. 7. Metformin 1000 mg 1 tablet daily. 8. Actos 30 mg 0.5 tablet daily.  REVIEW OF SYSTEMS:  CHEST:  She  denies any cough or shortness of breath. CARDIOVASCULAR:  Denies any chest pain or dizziness or palpitation. MUSCULOSKELETAL:  Significant for right shoulder pain as above in the HPI.  ABDOMEN:  Denies any nausea, vomiting, or change in bowel habits. GU:  Denies any burning micturition, increasing urinary frequency, or flank pain.  CONSTITUTIONAL:  Denies any fever or chills.  PHYSICAL EXAMINATION:  VITAL SIGNS:  Blood pressure 109/82, heart rate of 81, respiratory rate 22, and O2 sat 98%. GENERAL:  She is alert and oriented x3. NECK:  Supple.  No JVD. LUNGS:  Clear to auscultation bilaterally. CARDIOVASCULAR:  S1 and S2.  Regular rhythm and rate. ABDOMEN:  Obese, soft, and nontender.  Bowel sounds positive. EXTREMITIES:  Trace edema.  Right shoulder, limited range of motion secondary to prior surgery as per the patient.  She does have minimal tenderness in her arm, better with movement of her shoulders. NEUROLOGICAL:  Nonfocal.  LAB RESULTS:  Sodium 133, potassium 4.7, glucose 159, BUN 22, and creatinine 1.08.  CBC showed WBCs of 8.1, hemoglobin 12.7, hematocrit 37.9, and platelet count 46.  D-dimer 0.63.  Troponin-I 0.00.  RADIOLOGY: 1. CT angiogram of the chest showed no evidence of PE.  Indeterminate     4-mm pulmonary nodule in the right upper and lower lobes.  No     evidence of thoracic aortic dissection.  Postsurgical changes of     the right shoulder.  No acute aggressive osseous abnormality with     special attention paid to the right shoulder and scapula. 2. Right shoulder x-ray showed mild degenerative change of the     shoulder, postsurgical change of the right shoulder. 3. Chest x-ray showed no definite evidence of acute cardiopulmonary     disease. 4. EKG showed sinus rhythm, left anterior fascicular block, early     precordial RS transition.  No significant change from previous EKG     in 2009.  ASSESSMENT/PLAN: 1. Right shoulder pain, most likely  musculoskeletal. 2. History of right shoulder rotator cuff repair. 3. Thrombocytopenia, chronic. 4. Diabetes mellitus. 5. Hypertension. 6. Hyperlipidemia.  PLAN: 1. Given description of the pain, no new EKG changes, and negative     troponin, it is very unlikely this is a cardiac origin.  Most     likely, this pain is musculoskeletal in origin. 2. We will continue with pain control. 3. CT of the chest also commented on the right shoulder/scapular area.     There are no osseous abnormalities identified as above. 4. Given her risk factor, we will cycle the cardiac enzymes to     complete the workup, however, as I mentioned above it is very     unlikely. 5. Continue her home medication for diabetes and hypertension. 6. Check hemoglobin A1c. 7. Continue her statins. 8. DVT and GI prophylaxis. 9. PT/OT consults.          ______________________________ Baltazar Najjar, MD     SA/MEDQ  D:  12/05/2010  T:  12/05/2010  Job:  [id number]  Electronically Signed by Hannah Beat MD on 02/03/2011 11:32:31 AM

## 2011-02-03 NOTE — H&P (Signed)
  [name], [name] NO.:  [id number]  MEDICAL RECORD NO.:  [id number]  LOCATION:  4713                         FACILITY:  [facility]  PHYSICIAN:  Baltazar Najjar, MD     DATE OF BIRTH:  10/09/1930  DATE OF ADMISSION:  12/05/2010 DATE OF DISCHARGE:                             HISTORY & PHYSICAL   ADDENDUM  Thrombocytopenia.  As per the patient, she did have workup done in the past including a bone marrow biopsy.  However, she was unable to give me any more details, as per her that was years ago.  I will request Hematology consult while she is in the hospital versus get her an appointment as an outpatient to follow up on her thrombocytopenia.  Chronic kidney disease stage III, stable at baseline.  Probably secondary to diabetic nephropathy or hypertensive nephrosclerosis. This, can also be followed as an outpatient.  Deep vein thrombosis prophylaxis with sequential compression devices.          ______________________________ Baltazar Najjar, MD     SA/MEDQ  D:  12/05/2010  T:  12/05/2010  Job:  [id number]  cc:   Massie Maroon, MD  Electronically Signed by Hannah Beat MD on 02/03/2011 11:31:54 AM

## 2011-05-13 ENCOUNTER — Other Ambulatory Visit: Payer: Self-pay | Admitting: Internal Medicine

## 2011-05-13 DIAGNOSIS — Z1231 Encounter for screening mammogram for malignant neoplasm of breast: Secondary | ICD-10-CM

## 2011-06-30 ENCOUNTER — Ambulatory Visit: Payer: Medicare Other

## 2011-07-09 ENCOUNTER — Ambulatory Visit
Admission: RE | Admit: 2011-07-09 | Discharge: 2011-07-09 | Disposition: A | Discharge status: Home / Self Care | Payer: Medicare Other | Source: Ambulatory Visit | Attending: Internal Medicine | Admitting: Internal Medicine

## 2011-07-09 DIAGNOSIS — Z1231 Encounter for screening mammogram for malignant neoplasm of breast: Secondary | ICD-10-CM

## 2011-07-13 ENCOUNTER — Other Ambulatory Visit: Payer: Self-pay | Admitting: Internal Medicine

## 2011-07-13 DIAGNOSIS — R928 Other abnormal and inconclusive findings on diagnostic imaging of breast: Secondary | ICD-10-CM

## 2011-07-13 DIAGNOSIS — Z124 Encounter for screening for malignant neoplasm of cervix: Secondary | ICD-10-CM | POA: Diagnosis not present

## 2011-07-13 DIAGNOSIS — N898 Other specified noninflammatory disorders of vagina: Secondary | ICD-10-CM | POA: Diagnosis not present

## 2011-07-13 DIAGNOSIS — Z01419 Encounter for gynecological examination (general) (routine) without abnormal findings: Secondary | ICD-10-CM | POA: Diagnosis not present

## 2011-07-13 DIAGNOSIS — N899 Noninflammatory disorder of vagina, unspecified: Secondary | ICD-10-CM | POA: Diagnosis not present

## 2011-07-17 ENCOUNTER — Other Ambulatory Visit: Payer: Self-pay | Admitting: Internal Medicine

## 2011-07-17 ENCOUNTER — Ambulatory Visit
Admission: RE | Admit: 2011-07-17 | Discharge: 2011-07-17 | Disposition: A | Discharge status: Home / Self Care | Payer: Medicare Other | Source: Ambulatory Visit | Attending: Internal Medicine | Admitting: Internal Medicine

## 2011-07-17 DIAGNOSIS — N6310 Unspecified lump in the right breast, unspecified quadrant: Secondary | ICD-10-CM

## 2011-07-17 DIAGNOSIS — R928 Other abnormal and inconclusive findings on diagnostic imaging of breast: Secondary | ICD-10-CM

## 2011-07-17 DIAGNOSIS — N63 Unspecified lump in unspecified breast: Secondary | ICD-10-CM | POA: Diagnosis not present

## 2011-07-28 ENCOUNTER — Ambulatory Visit
Admission: RE | Admit: 2011-07-28 | Discharge: 2011-07-28 | Disposition: A | Discharge status: Home / Self Care | Payer: Medicare Other | Source: Ambulatory Visit | Attending: Internal Medicine | Admitting: Internal Medicine

## 2011-07-28 ENCOUNTER — Other Ambulatory Visit: Payer: Self-pay | Admitting: Internal Medicine

## 2011-07-28 DIAGNOSIS — N6310 Unspecified lump in the right breast, unspecified quadrant: Secondary | ICD-10-CM

## 2011-07-28 DIAGNOSIS — D249 Benign neoplasm of unspecified breast: Secondary | ICD-10-CM | POA: Diagnosis not present

## 2011-07-28 DIAGNOSIS — N63 Unspecified lump in unspecified breast: Secondary | ICD-10-CM | POA: Diagnosis not present

## 2011-08-27 DIAGNOSIS — Z79899 Other long term (current) drug therapy: Secondary | ICD-10-CM | POA: Diagnosis not present

## 2011-08-27 DIAGNOSIS — D518 Other vitamin B12 deficiency anemias: Secondary | ICD-10-CM | POA: Diagnosis not present

## 2011-08-27 DIAGNOSIS — E119 Type 2 diabetes mellitus without complications: Secondary | ICD-10-CM | POA: Diagnosis not present

## 2011-08-27 DIAGNOSIS — E785 Hyperlipidemia, unspecified: Secondary | ICD-10-CM | POA: Diagnosis not present

## 2011-09-02 DIAGNOSIS — I10 Essential (primary) hypertension: Secondary | ICD-10-CM | POA: Diagnosis not present

## 2011-09-02 DIAGNOSIS — M542 Cervicalgia: Secondary | ICD-10-CM | POA: Diagnosis not present

## 2011-09-02 DIAGNOSIS — E669 Obesity, unspecified: Secondary | ICD-10-CM | POA: Diagnosis not present

## 2011-09-02 DIAGNOSIS — M5030 Other cervical disc degeneration, unspecified cervical region: Secondary | ICD-10-CM | POA: Diagnosis not present

## 2011-09-02 DIAGNOSIS — M47812 Spondylosis without myelopathy or radiculopathy, cervical region: Secondary | ICD-10-CM | POA: Diagnosis not present

## 2011-09-02 DIAGNOSIS — E7800 Pure hypercholesterolemia, unspecified: Secondary | ICD-10-CM | POA: Diagnosis not present

## 2011-09-02 DIAGNOSIS — E119 Type 2 diabetes mellitus without complications: Secondary | ICD-10-CM | POA: Diagnosis not present

## 2011-09-07 DIAGNOSIS — H2510 Age-related nuclear cataract, unspecified eye: Secondary | ICD-10-CM | POA: Diagnosis not present

## 2011-09-07 DIAGNOSIS — H4011X0 Primary open-angle glaucoma, stage unspecified: Secondary | ICD-10-CM | POA: Diagnosis not present

## 2011-09-07 DIAGNOSIS — H409 Unspecified glaucoma: Secondary | ICD-10-CM | POA: Diagnosis not present

## 2011-09-07 DIAGNOSIS — E119 Type 2 diabetes mellitus without complications: Secondary | ICD-10-CM | POA: Diagnosis not present

## 2011-09-18 DIAGNOSIS — L570 Actinic keratosis: Secondary | ICD-10-CM | POA: Diagnosis not present

## 2011-09-18 DIAGNOSIS — D235 Other benign neoplasm of skin of trunk: Secondary | ICD-10-CM | POA: Diagnosis not present

## 2011-12-26 DIAGNOSIS — I10 Essential (primary) hypertension: Secondary | ICD-10-CM | POA: Diagnosis not present

## 2011-12-26 DIAGNOSIS — E860 Dehydration: Secondary | ICD-10-CM | POA: Diagnosis not present

## 2011-12-26 DIAGNOSIS — E118 Type 2 diabetes mellitus with unspecified complications: Secondary | ICD-10-CM | POA: Diagnosis not present

## 2011-12-26 DIAGNOSIS — R42 Dizziness and giddiness: Secondary | ICD-10-CM | POA: Diagnosis not present

## 2011-12-26 DIAGNOSIS — D696 Thrombocytopenia, unspecified: Secondary | ICD-10-CM | POA: Diagnosis not present

## 2011-12-26 DIAGNOSIS — N390 Urinary tract infection, site not specified: Secondary | ICD-10-CM | POA: Diagnosis not present

## 2011-12-26 DIAGNOSIS — E1165 Type 2 diabetes mellitus with hyperglycemia: Secondary | ICD-10-CM | POA: Diagnosis not present

## 2011-12-26 DIAGNOSIS — IMO0001 Reserved for inherently not codable concepts without codable children: Secondary | ICD-10-CM | POA: Diagnosis not present

## 2011-12-30 DIAGNOSIS — E119 Type 2 diabetes mellitus without complications: Secondary | ICD-10-CM | POA: Diagnosis not present

## 2011-12-30 DIAGNOSIS — I10 Essential (primary) hypertension: Secondary | ICD-10-CM | POA: Diagnosis not present

## 2011-12-30 DIAGNOSIS — N390 Urinary tract infection, site not specified: Secondary | ICD-10-CM | POA: Diagnosis not present

## 2011-12-30 DIAGNOSIS — R112 Nausea with vomiting, unspecified: Secondary | ICD-10-CM | POA: Diagnosis not present

## 2012-01-01 DIAGNOSIS — D72829 Elevated white blood cell count, unspecified: Secondary | ICD-10-CM | POA: Diagnosis not present

## 2012-01-01 DIAGNOSIS — R112 Nausea with vomiting, unspecified: Secondary | ICD-10-CM | POA: Diagnosis not present

## 2012-01-01 DIAGNOSIS — E119 Type 2 diabetes mellitus without complications: Secondary | ICD-10-CM | POA: Diagnosis not present

## 2012-01-01 DIAGNOSIS — N390 Urinary tract infection, site not specified: Secondary | ICD-10-CM | POA: Diagnosis not present

## 2012-01-06 DIAGNOSIS — R112 Nausea with vomiting, unspecified: Secondary | ICD-10-CM | POA: Diagnosis not present

## 2012-01-13 DIAGNOSIS — I10 Essential (primary) hypertension: Secondary | ICD-10-CM | POA: Diagnosis not present

## 2012-01-13 DIAGNOSIS — R112 Nausea with vomiting, unspecified: Secondary | ICD-10-CM | POA: Diagnosis not present

## 2012-01-13 DIAGNOSIS — M549 Dorsalgia, unspecified: Secondary | ICD-10-CM | POA: Diagnosis not present

## 2012-01-13 DIAGNOSIS — Z23 Encounter for immunization: Secondary | ICD-10-CM | POA: Diagnosis not present

## 2012-01-13 DIAGNOSIS — R197 Diarrhea, unspecified: Secondary | ICD-10-CM | POA: Diagnosis not present

## 2012-01-18 DIAGNOSIS — M5137 Other intervertebral disc degeneration, lumbosacral region: Secondary | ICD-10-CM | POA: Diagnosis not present

## 2012-01-18 DIAGNOSIS — M47817 Spondylosis without myelopathy or radiculopathy, lumbosacral region: Secondary | ICD-10-CM | POA: Diagnosis not present

## 2012-01-18 DIAGNOSIS — G8929 Other chronic pain: Secondary | ICD-10-CM | POA: Diagnosis not present

## 2012-01-18 DIAGNOSIS — IMO0002 Reserved for concepts with insufficient information to code with codable children: Secondary | ICD-10-CM | POA: Diagnosis not present

## 2012-01-18 DIAGNOSIS — Z79899 Other long term (current) drug therapy: Secondary | ICD-10-CM | POA: Diagnosis not present

## 2012-01-19 ENCOUNTER — Other Ambulatory Visit: Payer: Self-pay | Admitting: Physician Assistant

## 2012-01-19 ENCOUNTER — Ambulatory Visit
Admission: RE | Admit: 2012-01-19 | Discharge: 2012-01-19 | Disposition: A | Discharge status: Home / Self Care | Payer: Medicare Other | Source: Ambulatory Visit | Attending: Physician Assistant | Admitting: Physician Assistant

## 2012-01-19 ENCOUNTER — Other Ambulatory Visit: Payer: Self-pay | Admitting: Pain Medicine

## 2012-01-19 DIAGNOSIS — M545 Low back pain, unspecified: Secondary | ICD-10-CM

## 2012-01-19 DIAGNOSIS — M47817 Spondylosis without myelopathy or radiculopathy, lumbosacral region: Secondary | ICD-10-CM | POA: Diagnosis not present

## 2012-01-19 DIAGNOSIS — M25559 Pain in unspecified hip: Secondary | ICD-10-CM

## 2012-01-29 ENCOUNTER — Ambulatory Visit
Admission: RE | Admit: 2012-01-29 | Discharge: 2012-01-29 | Disposition: A | Discharge status: Home / Self Care | Payer: Medicare Other | Source: Ambulatory Visit | Attending: Pain Medicine | Admitting: Pain Medicine

## 2012-01-29 DIAGNOSIS — M545 Low back pain, unspecified: Secondary | ICD-10-CM

## 2012-01-29 DIAGNOSIS — M5137 Other intervertebral disc degeneration, lumbosacral region: Secondary | ICD-10-CM | POA: Diagnosis not present

## 2012-02-01 DIAGNOSIS — M961 Postlaminectomy syndrome, not elsewhere classified: Secondary | ICD-10-CM | POA: Diagnosis not present

## 2012-02-01 DIAGNOSIS — M5137 Other intervertebral disc degeneration, lumbosacral region: Secondary | ICD-10-CM | POA: Diagnosis not present

## 2012-02-01 DIAGNOSIS — M47817 Spondylosis without myelopathy or radiculopathy, lumbosacral region: Secondary | ICD-10-CM | POA: Diagnosis not present

## 2012-02-01 DIAGNOSIS — G894 Chronic pain syndrome: Secondary | ICD-10-CM | POA: Diagnosis not present

## 2012-02-02 DIAGNOSIS — M47817 Spondylosis without myelopathy or radiculopathy, lumbosacral region: Secondary | ICD-10-CM | POA: Diagnosis not present

## 2012-02-23 DIAGNOSIS — T1490XA Injury, unspecified, initial encounter: Secondary | ICD-10-CM | POA: Diagnosis not present

## 2012-03-01 DIAGNOSIS — G894 Chronic pain syndrome: Secondary | ICD-10-CM | POA: Diagnosis not present

## 2012-03-01 DIAGNOSIS — Z79899 Other long term (current) drug therapy: Secondary | ICD-10-CM | POA: Diagnosis not present

## 2012-03-01 DIAGNOSIS — M5137 Other intervertebral disc degeneration, lumbosacral region: Secondary | ICD-10-CM | POA: Diagnosis not present

## 2012-03-01 DIAGNOSIS — M5380 Other specified dorsopathies, site unspecified: Secondary | ICD-10-CM | POA: Diagnosis not present

## 2012-03-01 DIAGNOSIS — IMO0001 Reserved for inherently not codable concepts without codable children: Secondary | ICD-10-CM | POA: Diagnosis not present

## 2012-03-08 DIAGNOSIS — M5380 Other specified dorsopathies, site unspecified: Secondary | ICD-10-CM | POA: Diagnosis not present

## 2012-03-08 DIAGNOSIS — M47817 Spondylosis without myelopathy or radiculopathy, lumbosacral region: Secondary | ICD-10-CM | POA: Diagnosis not present

## 2012-03-15 DIAGNOSIS — H2510 Age-related nuclear cataract, unspecified eye: Secondary | ICD-10-CM | POA: Diagnosis not present

## 2012-03-15 DIAGNOSIS — H4011X0 Primary open-angle glaucoma, stage unspecified: Secondary | ICD-10-CM | POA: Diagnosis not present

## 2012-03-15 DIAGNOSIS — H409 Unspecified glaucoma: Secondary | ICD-10-CM | POA: Diagnosis not present

## 2012-03-18 DIAGNOSIS — D045 Carcinoma in situ of skin of trunk: Secondary | ICD-10-CM | POA: Diagnosis not present

## 2012-03-18 DIAGNOSIS — L570 Actinic keratosis: Secondary | ICD-10-CM | POA: Diagnosis not present

## 2012-03-18 DIAGNOSIS — D0460 Carcinoma in situ of skin of unspecified upper limb, including shoulder: Secondary | ICD-10-CM | POA: Diagnosis not present

## 2012-03-18 DIAGNOSIS — D235 Other benign neoplasm of skin of trunk: Secondary | ICD-10-CM | POA: Diagnosis not present

## 2012-04-05 DIAGNOSIS — M5380 Other specified dorsopathies, site unspecified: Secondary | ICD-10-CM | POA: Diagnosis not present

## 2012-04-05 DIAGNOSIS — Z79899 Other long term (current) drug therapy: Secondary | ICD-10-CM | POA: Diagnosis not present

## 2012-04-05 DIAGNOSIS — IMO0001 Reserved for inherently not codable concepts without codable children: Secondary | ICD-10-CM | POA: Diagnosis not present

## 2012-04-05 DIAGNOSIS — M47817 Spondylosis without myelopathy or radiculopathy, lumbosacral region: Secondary | ICD-10-CM | POA: Diagnosis not present

## 2012-04-05 DIAGNOSIS — M76899 Other specified enthesopathies of unspecified lower limb, excluding foot: Secondary | ICD-10-CM | POA: Diagnosis not present

## 2012-04-13 DIAGNOSIS — M76899 Other specified enthesopathies of unspecified lower limb, excluding foot: Secondary | ICD-10-CM | POA: Diagnosis not present

## 2012-04-13 DIAGNOSIS — G894 Chronic pain syndrome: Secondary | ICD-10-CM | POA: Diagnosis not present

## 2012-04-13 DIAGNOSIS — M47817 Spondylosis without myelopathy or radiculopathy, lumbosacral region: Secondary | ICD-10-CM | POA: Diagnosis not present

## 2012-04-13 DIAGNOSIS — IMO0001 Reserved for inherently not codable concepts without codable children: Secondary | ICD-10-CM | POA: Diagnosis not present

## 2012-04-22 ENCOUNTER — Other Ambulatory Visit: Payer: Self-pay | Admitting: Pain Medicine

## 2012-04-22 DIAGNOSIS — M25552 Pain in left hip: Secondary | ICD-10-CM

## 2012-04-29 ENCOUNTER — Ambulatory Visit
Admission: RE | Admit: 2012-04-29 | Discharge: 2012-04-29 | Disposition: A | Discharge status: Home / Self Care | Payer: Medicare Other | Source: Ambulatory Visit | Attending: Pain Medicine | Admitting: Pain Medicine

## 2012-04-29 DIAGNOSIS — M25552 Pain in left hip: Secondary | ICD-10-CM

## 2012-04-29 DIAGNOSIS — M169 Osteoarthritis of hip, unspecified: Secondary | ICD-10-CM | POA: Diagnosis not present

## 2012-04-29 DIAGNOSIS — M6580 Other synovitis and tenosynovitis, unspecified site: Secondary | ICD-10-CM | POA: Diagnosis not present

## 2012-05-03 DIAGNOSIS — E119 Type 2 diabetes mellitus without complications: Secondary | ICD-10-CM | POA: Diagnosis not present

## 2012-05-03 DIAGNOSIS — I10 Essential (primary) hypertension: Secondary | ICD-10-CM | POA: Diagnosis not present

## 2012-05-04 DIAGNOSIS — G894 Chronic pain syndrome: Secondary | ICD-10-CM | POA: Diagnosis not present

## 2012-05-04 DIAGNOSIS — IMO0001 Reserved for inherently not codable concepts without codable children: Secondary | ICD-10-CM | POA: Diagnosis not present

## 2012-05-04 DIAGNOSIS — M5380 Other specified dorsopathies, site unspecified: Secondary | ICD-10-CM | POA: Diagnosis not present

## 2012-05-04 DIAGNOSIS — M47817 Spondylosis without myelopathy or radiculopathy, lumbosacral region: Secondary | ICD-10-CM | POA: Diagnosis not present

## 2012-05-10 DIAGNOSIS — E119 Type 2 diabetes mellitus without complications: Secondary | ICD-10-CM | POA: Diagnosis not present

## 2012-05-10 DIAGNOSIS — I10 Essential (primary) hypertension: Secondary | ICD-10-CM | POA: Diagnosis not present

## 2012-05-10 DIAGNOSIS — E785 Hyperlipidemia, unspecified: Secondary | ICD-10-CM | POA: Diagnosis not present

## 2012-05-25 DIAGNOSIS — M76899 Other specified enthesopathies of unspecified lower limb, excluding foot: Secondary | ICD-10-CM | POA: Diagnosis not present

## 2012-06-29 DIAGNOSIS — G894 Chronic pain syndrome: Secondary | ICD-10-CM | POA: Diagnosis not present

## 2012-06-29 DIAGNOSIS — IMO0001 Reserved for inherently not codable concepts without codable children: Secondary | ICD-10-CM | POA: Diagnosis not present

## 2012-06-29 DIAGNOSIS — M47817 Spondylosis without myelopathy or radiculopathy, lumbosacral region: Secondary | ICD-10-CM | POA: Diagnosis not present

## 2012-06-29 DIAGNOSIS — Z79899 Other long term (current) drug therapy: Secondary | ICD-10-CM | POA: Diagnosis not present

## 2012-07-04 ENCOUNTER — Telehealth (INDEPENDENT_AMBULATORY_CARE_PROVIDER_SITE_OTHER): Payer: Self-pay | Admitting: *Deleted

## 2012-07-04 NOTE — Telephone Encounter (Signed)
Patient reports that she has previously been evaluated for abdominal hernias but states that within the last 4 days she is now having increased pain at the sites.  Patient reports she is unable to sit or bend.  Appt was made for patient but instructed patient that if pain gets too severe she may want to be evaluated in the ED.

## 2012-07-19 ENCOUNTER — Encounter (INDEPENDENT_AMBULATORY_CARE_PROVIDER_SITE_OTHER): Payer: Self-pay | Admitting: General Surgery

## 2012-07-19 ENCOUNTER — Ambulatory Visit (INDEPENDENT_AMBULATORY_CARE_PROVIDER_SITE_OTHER): Payer: Medicare Other | Admitting: General Surgery

## 2012-07-19 VITALS — BP 132/80 | HR 76 | Temp 98.2°F | Resp 18 | Ht 64.0 in | Wt 238.0 lb

## 2012-07-19 DIAGNOSIS — D1779 Benign lipomatous neoplasm of other sites: Secondary | ICD-10-CM | POA: Diagnosis not present

## 2012-07-19 DIAGNOSIS — D171 Benign lipomatous neoplasm of skin and subcutaneous tissue of trunk: Secondary | ICD-10-CM | POA: Insufficient documentation

## 2012-07-19 NOTE — Progress Notes (Signed)
This very nice lady comes in complaining of right lower back pain associated with an area where she had previous lipomas. She continues to have lipomas in this area but they are not in any way infected or significantly enlarged.  Prior to coming to see me today the patient had entertained having these removed before but it advised her against it since it in an area where she has significant adipose tissue in precise excision is questionable. Also I am not quite sure how much removing the lipoma will affect her intermittent pain.  2 weeks ago when the patient made this appointment to see me she was having significant discomfort in that area which has resolved. I don't think that she requires surgery at this time. The patient was reassured. I will see her back here on a when necessary basis.  Her laparoscopic ventral hernia repair has held up and she is doing well.

## 2012-08-09 DIAGNOSIS — E785 Hyperlipidemia, unspecified: Secondary | ICD-10-CM | POA: Diagnosis not present

## 2012-08-09 DIAGNOSIS — E119 Type 2 diabetes mellitus without complications: Secondary | ICD-10-CM | POA: Diagnosis not present

## 2012-08-15 DIAGNOSIS — E119 Type 2 diabetes mellitus without complications: Secondary | ICD-10-CM | POA: Diagnosis not present

## 2012-09-07 DIAGNOSIS — IMO0001 Reserved for inherently not codable concepts without codable children: Secondary | ICD-10-CM | POA: Diagnosis not present

## 2012-09-07 DIAGNOSIS — M5137 Other intervertebral disc degeneration, lumbosacral region: Secondary | ICD-10-CM | POA: Diagnosis not present

## 2012-09-07 DIAGNOSIS — G894 Chronic pain syndrome: Secondary | ICD-10-CM | POA: Diagnosis not present

## 2012-09-07 DIAGNOSIS — M169 Osteoarthritis of hip, unspecified: Secondary | ICD-10-CM | POA: Diagnosis not present

## 2012-09-13 DIAGNOSIS — E119 Type 2 diabetes mellitus without complications: Secondary | ICD-10-CM | POA: Diagnosis not present

## 2012-09-13 DIAGNOSIS — H409 Unspecified glaucoma: Secondary | ICD-10-CM | POA: Diagnosis not present

## 2012-09-13 DIAGNOSIS — H2510 Age-related nuclear cataract, unspecified eye: Secondary | ICD-10-CM | POA: Diagnosis not present

## 2012-09-13 DIAGNOSIS — H4011X0 Primary open-angle glaucoma, stage unspecified: Secondary | ICD-10-CM | POA: Diagnosis not present

## 2012-09-26 ENCOUNTER — Emergency Department (HOSPITAL_COMMUNITY)
Admission: EM | Admit: 2012-09-26 | Discharge: 2012-09-26 | Disposition: A | Discharge status: Home / Self Care | Payer: Medicare Other | Attending: Emergency Medicine | Admitting: Emergency Medicine

## 2012-09-26 DIAGNOSIS — E119 Type 2 diabetes mellitus without complications: Secondary | ICD-10-CM | POA: Diagnosis not present

## 2012-09-26 DIAGNOSIS — Y9389 Activity, other specified: Secondary | ICD-10-CM | POA: Insufficient documentation

## 2012-09-26 DIAGNOSIS — Z79899 Other long term (current) drug therapy: Secondary | ICD-10-CM | POA: Diagnosis not present

## 2012-09-26 DIAGNOSIS — E785 Hyperlipidemia, unspecified: Secondary | ICD-10-CM | POA: Insufficient documentation

## 2012-09-26 DIAGNOSIS — Y929 Unspecified place or not applicable: Secondary | ICD-10-CM | POA: Insufficient documentation

## 2012-09-26 DIAGNOSIS — I10 Essential (primary) hypertension: Secondary | ICD-10-CM | POA: Diagnosis not present

## 2012-09-26 DIAGNOSIS — S41009A Unspecified open wound of unspecified shoulder, initial encounter: Secondary | ICD-10-CM | POA: Diagnosis not present

## 2012-09-26 DIAGNOSIS — S81809A Unspecified open wound, unspecified lower leg, initial encounter: Secondary | ICD-10-CM | POA: Diagnosis not present

## 2012-09-26 DIAGNOSIS — S71109A Unspecified open wound, unspecified thigh, initial encounter: Secondary | ICD-10-CM | POA: Insufficient documentation

## 2012-09-26 DIAGNOSIS — S71009A Unspecified open wound, unspecified hip, initial encounter: Secondary | ICD-10-CM | POA: Insufficient documentation

## 2012-09-26 DIAGNOSIS — W268XXA Contact with other sharp object(s), not elsewhere classified, initial encounter: Secondary | ICD-10-CM | POA: Insufficient documentation

## 2012-09-26 DIAGNOSIS — I83893 Varicose veins of bilateral lower extremities with other complications: Secondary | ICD-10-CM | POA: Diagnosis not present

## 2012-09-26 DIAGNOSIS — I83891 Varicose veins of right lower extremities with other complications: Secondary | ICD-10-CM

## 2012-09-26 NOTE — ED Provider Notes (Signed)
History     CSN: [id number]  Arrival date & time 09/26/12  1210   First MD Initiated Contact with Patient 09/26/12 1254      Chief Complaint  Patient presents with  . Bleeding/Bruising     Patient is a 79 y.o. female presenting with skin laceration. The history is provided by the patient.  Laceration Location:  Leg Leg laceration location: right lateral thigh. Depth:  Cutaneous Bleeding: venous   Pain details:    Quality:  Aching Worsened by:  Nothing tried pt reports she tried shaving her legs and she cut a varicose vein and began to bleed from her right thigh No other injuries No weakness reported She was unable to control bleeding and EMS was called She is not on anticoagulants  Past Medical History  Diagnosis Date  . Hypertension   . Diabetes mellitus   . Hyperlipidemia     Past Surgical History  Procedure Laterality Date  . Rupture disk  1970"s  . Cholecystectomy  2000  . Shoulder arthroscopy with rotator cuff repair  1999    rt shoulder  . Hernia repair  2007    Family History  Problem Relation Age of Onset  . Stroke Mother     History  Substance Use Topics  . Smoking status: Never Smoker   . Smokeless tobacco: Not on file  . Alcohol Use: Not on file    OB History   Grav Para Term Preterm Abortions TAB SAB Ect Mult Living                  Review of Systems  Constitutional: Negative for fever.  Skin: Positive for wound.    Allergies  Review of patient's allergies indicates no known allergies.  Home Medications   Current Outpatient Rx  Name  Route  Sig  Dispense  Refill  . atorvastatin (LIPITOR) 10 MG tablet   Oral   Take 10 mg by mouth daily.         Marland Kitchen glipiZIDE (GLUCOTROL) 5 MG tablet   Oral   Take 5 mg by mouth daily.          . hydrochlorothiazide (HYDRODIURIL) 25 MG tablet   Oral   Take 25 mg by mouth daily.         Marland Kitchen latanoprost (XALATAN) 0.005 % ophthalmic solution   Both Eyes   Place 1 drop into both eyes at  bedtime.          . Liraglutide (VICTOZA [place])   Subcutaneous   Inject 1 application into the skin daily.         . metFORMIN (GLUCOPHAGE) 1000 MG tablet   Oral   Take 1,000 mg by mouth daily.          . pioglitazone (ACTOS) 30 MG tablet   Oral   Take 15 mg by mouth daily.         Marland Kitchen telmisartan (MICARDIS) 80 MG tablet   Oral   Take 80 mg by mouth daily.         Marland Kitchen glucose blood test strip   Other   1 each by Other route as needed for other. Use as instructed         . Multiple Vitamins-Minerals (MULTIVITAMIN WITH MINERALS) tablet   Oral   Take 1 tablet by mouth daily.           BP 129/61  Pulse 72  SpO2 100%  Physical Exam CONSTITUTIONAL: Well developed/well nourished HEAD:  Normocephalic/atraumatic EYES: EOMI ENMT: Mucous membranes moist NECK: supple no meningeal signs CV: S1/S2 noted, no murmurs/rubs/gallops noted LUNGS: Lungs are clear to auscultation bilaterally, no apparent distress ABDOMEN: soft, nontender, no rebound or guarding NEURO: Pt is awake/alert, moves all extremitiesx4 EXTREMITIES: pulses normal, full ROM Right distal/lateral thigh - small varicose vein that is actively bleeding SKIN: warm, color normal PSYCH: no abnormalities of mood noted  ED Course  Procedures   LACERATION REPAIR Performed by: Joya Gaskins Consent: Verbal consent obtained. Risks and benefits: risks, benefits and alternatives were discussed Patient identity confirmed: provided demographic data Time out performed prior to procedure Prepped and Draped in normal sterile fashion Wound explored Laceration Location: right thigh Laceration Length: less than 0.5 cm No Foreign Bodies seen or palpated Anesthesia: local infiltration Local anesthetic: lidocaine 1% with epinephrine Anesthetic total: 3 ml  Amount of cleaning: standard with betadine Skin closure: simple Number of sutures or staples: 2  Technique: simple interrupted Patient tolerance: Patient  tolerated the procedure well with no immediate complications.   1. Bleeding from varicose vein, right    2 sutures required to control bleeding Pt otherwise stable and no other injuries Discussed need to keep wound covered until tomorrow and will need sutures out later this week   MDM  Nursing notes including past medical history and social history reviewed and considered in documentation         Joya Gaskins, MD 09/26/12 1455

## 2012-09-26 NOTE — ED Notes (Signed)
Per ems pt was shaving and cut a vericose vein on rt leg. Pt unable to stop bleeding.  Pt alert oriented X4.  Vitals , 180 palpated, pulse 80.  NKDA,

## 2012-09-26 NOTE — ED Notes (Signed)
Pt was shaving her leg and cut a vericose vein on outside of rt leg just above knee.  EMS applied pressure dressing and bleeding is controlled.  Pt alert oriented X4

## 2012-11-11 DIAGNOSIS — E119 Type 2 diabetes mellitus without complications: Secondary | ICD-10-CM | POA: Diagnosis not present

## 2012-11-16 DIAGNOSIS — R0609 Other forms of dyspnea: Secondary | ICD-10-CM | POA: Diagnosis not present

## 2012-11-16 DIAGNOSIS — R0989 Other specified symptoms and signs involving the circulatory and respiratory systems: Secondary | ICD-10-CM | POA: Diagnosis not present

## 2012-11-16 DIAGNOSIS — E7800 Pure hypercholesterolemia, unspecified: Secondary | ICD-10-CM | POA: Diagnosis not present

## 2012-11-16 DIAGNOSIS — E119 Type 2 diabetes mellitus without complications: Secondary | ICD-10-CM | POA: Diagnosis not present

## 2012-11-16 DIAGNOSIS — J449 Chronic obstructive pulmonary disease, unspecified: Secondary | ICD-10-CM | POA: Diagnosis not present

## 2012-11-16 DIAGNOSIS — E669 Obesity, unspecified: Secondary | ICD-10-CM | POA: Diagnosis not present

## 2012-11-24 DIAGNOSIS — R0609 Other forms of dyspnea: Secondary | ICD-10-CM | POA: Diagnosis not present

## 2012-11-29 DIAGNOSIS — N899 Noninflammatory disorder of vagina, unspecified: Secondary | ICD-10-CM | POA: Diagnosis not present

## 2012-11-29 DIAGNOSIS — B373 Candidiasis of vulva and vagina: Secondary | ICD-10-CM | POA: Diagnosis not present

## 2012-12-07 DIAGNOSIS — E785 Hyperlipidemia, unspecified: Secondary | ICD-10-CM | POA: Diagnosis not present

## 2012-12-07 DIAGNOSIS — R079 Chest pain, unspecified: Secondary | ICD-10-CM | POA: Diagnosis not present

## 2012-12-07 DIAGNOSIS — R0602 Shortness of breath: Secondary | ICD-10-CM | POA: Diagnosis not present

## 2012-12-07 DIAGNOSIS — E119 Type 2 diabetes mellitus without complications: Secondary | ICD-10-CM | POA: Diagnosis not present

## 2012-12-12 DIAGNOSIS — M5380 Other specified dorsopathies, site unspecified: Secondary | ICD-10-CM | POA: Diagnosis not present

## 2012-12-12 DIAGNOSIS — Z79899 Other long term (current) drug therapy: Secondary | ICD-10-CM | POA: Diagnosis not present

## 2012-12-12 DIAGNOSIS — G894 Chronic pain syndrome: Secondary | ICD-10-CM | POA: Diagnosis not present

## 2012-12-12 DIAGNOSIS — IMO0001 Reserved for inherently not codable concepts without codable children: Secondary | ICD-10-CM | POA: Diagnosis not present

## 2012-12-13 DIAGNOSIS — R0602 Shortness of breath: Secondary | ICD-10-CM | POA: Diagnosis not present

## 2012-12-19 DIAGNOSIS — E119 Type 2 diabetes mellitus without complications: Secondary | ICD-10-CM | POA: Diagnosis not present

## 2012-12-19 DIAGNOSIS — R0602 Shortness of breath: Secondary | ICD-10-CM | POA: Diagnosis not present

## 2012-12-19 DIAGNOSIS — R079 Chest pain, unspecified: Secondary | ICD-10-CM | POA: Diagnosis not present

## 2012-12-20 DIAGNOSIS — N899 Noninflammatory disorder of vagina, unspecified: Secondary | ICD-10-CM | POA: Diagnosis not present

## 2012-12-20 DIAGNOSIS — K644 Residual hemorrhoidal skin tags: Secondary | ICD-10-CM | POA: Diagnosis not present

## 2012-12-29 DIAGNOSIS — Z23 Encounter for immunization: Secondary | ICD-10-CM | POA: Diagnosis not present

## 2012-12-29 DIAGNOSIS — G4733 Obstructive sleep apnea (adult) (pediatric): Secondary | ICD-10-CM | POA: Diagnosis not present

## 2012-12-29 DIAGNOSIS — R0609 Other forms of dyspnea: Secondary | ICD-10-CM | POA: Diagnosis not present

## 2012-12-29 DIAGNOSIS — E119 Type 2 diabetes mellitus without complications: Secondary | ICD-10-CM | POA: Diagnosis not present

## 2012-12-30 DIAGNOSIS — D485 Neoplasm of uncertain behavior of skin: Secondary | ICD-10-CM | POA: Diagnosis not present

## 2012-12-30 DIAGNOSIS — L819 Disorder of pigmentation, unspecified: Secondary | ICD-10-CM | POA: Diagnosis not present

## 2012-12-30 DIAGNOSIS — Z85828 Personal history of other malignant neoplasm of skin: Secondary | ICD-10-CM | POA: Diagnosis not present

## 2012-12-30 DIAGNOSIS — L570 Actinic keratosis: Secondary | ICD-10-CM | POA: Diagnosis not present

## 2013-01-02 DIAGNOSIS — I10 Essential (primary) hypertension: Secondary | ICD-10-CM | POA: Diagnosis not present

## 2013-01-02 DIAGNOSIS — R079 Chest pain, unspecified: Secondary | ICD-10-CM | POA: Diagnosis not present

## 2013-01-02 DIAGNOSIS — R0602 Shortness of breath: Secondary | ICD-10-CM | POA: Diagnosis not present

## 2013-01-18 DIAGNOSIS — R079 Chest pain, unspecified: Secondary | ICD-10-CM | POA: Diagnosis not present

## 2013-01-18 DIAGNOSIS — G4733 Obstructive sleep apnea (adult) (pediatric): Secondary | ICD-10-CM | POA: Diagnosis not present

## 2013-01-26 DIAGNOSIS — E1149 Type 2 diabetes mellitus with other diabetic neurological complication: Secondary | ICD-10-CM | POA: Diagnosis not present

## 2013-02-15 DIAGNOSIS — E7800 Pure hypercholesterolemia, unspecified: Secondary | ICD-10-CM | POA: Diagnosis not present

## 2013-02-15 DIAGNOSIS — E119 Type 2 diabetes mellitus without complications: Secondary | ICD-10-CM | POA: Diagnosis not present

## 2013-03-08 DIAGNOSIS — M25569 Pain in unspecified knee: Secondary | ICD-10-CM | POA: Diagnosis not present

## 2013-03-08 DIAGNOSIS — M1710 Unilateral primary osteoarthritis, unspecified knee: Secondary | ICD-10-CM | POA: Diagnosis not present

## 2013-03-20 DIAGNOSIS — H4011X0 Primary open-angle glaucoma, stage unspecified: Secondary | ICD-10-CM | POA: Diagnosis not present

## 2013-03-20 DIAGNOSIS — H25019 Cortical age-related cataract, unspecified eye: Secondary | ICD-10-CM | POA: Diagnosis not present

## 2013-03-20 DIAGNOSIS — H2510 Age-related nuclear cataract, unspecified eye: Secondary | ICD-10-CM | POA: Diagnosis not present

## 2013-03-20 DIAGNOSIS — H409 Unspecified glaucoma: Secondary | ICD-10-CM | POA: Diagnosis not present

## 2013-04-03 DIAGNOSIS — H409 Unspecified glaucoma: Secondary | ICD-10-CM | POA: Diagnosis not present

## 2013-04-03 DIAGNOSIS — H4011X0 Primary open-angle glaucoma, stage unspecified: Secondary | ICD-10-CM | POA: Diagnosis not present

## 2013-04-25 DIAGNOSIS — H4011X0 Primary open-angle glaucoma, stage unspecified: Secondary | ICD-10-CM | POA: Diagnosis not present

## 2013-04-25 DIAGNOSIS — H409 Unspecified glaucoma: Secondary | ICD-10-CM | POA: Diagnosis not present

## 2013-05-29 DIAGNOSIS — L821 Other seborrheic keratosis: Secondary | ICD-10-CM | POA: Diagnosis not present

## 2013-05-29 DIAGNOSIS — L538 Other specified erythematous conditions: Secondary | ICD-10-CM | POA: Diagnosis not present

## 2013-05-29 DIAGNOSIS — L570 Actinic keratosis: Secondary | ICD-10-CM | POA: Diagnosis not present

## 2013-07-17 DIAGNOSIS — IMO0001 Reserved for inherently not codable concepts without codable children: Secondary | ICD-10-CM | POA: Diagnosis not present

## 2013-07-17 DIAGNOSIS — G894 Chronic pain syndrome: Secondary | ICD-10-CM | POA: Diagnosis not present

## 2013-07-17 DIAGNOSIS — M961 Postlaminectomy syndrome, not elsewhere classified: Secondary | ICD-10-CM | POA: Diagnosis not present

## 2013-07-17 DIAGNOSIS — M5137 Other intervertebral disc degeneration, lumbosacral region: Secondary | ICD-10-CM | POA: Diagnosis not present

## 2013-08-15 DIAGNOSIS — H4011X0 Primary open-angle glaucoma, stage unspecified: Secondary | ICD-10-CM | POA: Diagnosis not present

## 2013-08-15 DIAGNOSIS — E7800 Pure hypercholesterolemia, unspecified: Secondary | ICD-10-CM | POA: Diagnosis not present

## 2013-08-15 DIAGNOSIS — E119 Type 2 diabetes mellitus without complications: Secondary | ICD-10-CM | POA: Diagnosis not present

## 2013-08-15 DIAGNOSIS — Z79899 Other long term (current) drug therapy: Secondary | ICD-10-CM | POA: Diagnosis not present

## 2013-08-15 DIAGNOSIS — H2510 Age-related nuclear cataract, unspecified eye: Secondary | ICD-10-CM | POA: Diagnosis not present

## 2013-08-15 DIAGNOSIS — H409 Unspecified glaucoma: Secondary | ICD-10-CM | POA: Diagnosis not present

## 2013-08-15 DIAGNOSIS — I10 Essential (primary) hypertension: Secondary | ICD-10-CM | POA: Diagnosis not present

## 2013-08-23 DIAGNOSIS — M76899 Other specified enthesopathies of unspecified lower limb, excluding foot: Secondary | ICD-10-CM | POA: Diagnosis not present

## 2013-09-22 DIAGNOSIS — D0439 Carcinoma in situ of skin of other parts of face: Secondary | ICD-10-CM | POA: Diagnosis not present

## 2013-09-22 DIAGNOSIS — L570 Actinic keratosis: Secondary | ICD-10-CM | POA: Diagnosis not present

## 2013-09-22 DIAGNOSIS — D0430 Carcinoma in situ of skin of unspecified part of face: Secondary | ICD-10-CM | POA: Diagnosis not present

## 2013-09-22 DIAGNOSIS — L820 Inflamed seborrheic keratosis: Secondary | ICD-10-CM | POA: Diagnosis not present

## 2013-10-10 ENCOUNTER — Ambulatory Visit (INDEPENDENT_AMBULATORY_CARE_PROVIDER_SITE_OTHER): Payer: Self-pay | Admitting: *Deleted

## 2013-10-10 DIAGNOSIS — I83893 Varicose veins of bilateral lower extremities with other complications: Secondary | ICD-10-CM

## 2013-10-10 NOTE — Progress Notes (Signed)
[name] c/o of left leg swelling and pain.  Measured and fitted for thigh high 20-30 mm HG compression stockings today and will begin wearing them today.  Ankle 24 cm, calf 43.5 cm, and thigh 71 cm.  Wears large thigh high Medivan compression stocking.  Instructed [name] in care and application of compression hose. Instructed [name] to wear compression hose daily, elevate legs frequently, and take Ibuprofen 600 mg with meals for pain and discomfort.  [name] verbalized understanding of instructions.

## 2013-10-12 ENCOUNTER — Emergency Department (HOSPITAL_COMMUNITY)
Admission: EM | Admit: 2013-10-12 | Discharge: 2013-10-12 | Disposition: A | Discharge status: Home / Self Care | Payer: Medicare Other | Attending: Emergency Medicine | Admitting: Emergency Medicine

## 2013-10-12 ENCOUNTER — Emergency Department (HOSPITAL_COMMUNITY): Payer: Medicare Other

## 2013-10-12 ENCOUNTER — Encounter (HOSPITAL_COMMUNITY): Payer: Self-pay | Admitting: Emergency Medicine

## 2013-10-12 DIAGNOSIS — S0120XA Unspecified open wound of nose, initial encounter: Secondary | ICD-10-CM | POA: Diagnosis not present

## 2013-10-12 DIAGNOSIS — I10 Essential (primary) hypertension: Secondary | ICD-10-CM | POA: Diagnosis not present

## 2013-10-12 DIAGNOSIS — Y9229 Other specified public building as the place of occurrence of the external cause: Secondary | ICD-10-CM | POA: Insufficient documentation

## 2013-10-12 DIAGNOSIS — S1093XA Contusion of unspecified part of neck, initial encounter: Secondary | ICD-10-CM

## 2013-10-12 DIAGNOSIS — Z79899 Other long term (current) drug therapy: Secondary | ICD-10-CM | POA: Diagnosis not present

## 2013-10-12 DIAGNOSIS — S0083XA Contusion of other part of head, initial encounter: Secondary | ICD-10-CM | POA: Insufficient documentation

## 2013-10-12 DIAGNOSIS — Y9389 Activity, other specified: Secondary | ICD-10-CM | POA: Insufficient documentation

## 2013-10-12 DIAGNOSIS — T1490XA Injury, unspecified, initial encounter: Secondary | ICD-10-CM | POA: Diagnosis not present

## 2013-10-12 DIAGNOSIS — S4980XA Other specified injuries of shoulder and upper arm, unspecified arm, initial encounter: Secondary | ICD-10-CM | POA: Diagnosis not present

## 2013-10-12 DIAGNOSIS — E119 Type 2 diabetes mellitus without complications: Secondary | ICD-10-CM | POA: Insufficient documentation

## 2013-10-12 DIAGNOSIS — S8000XA Contusion of unspecified knee, initial encounter: Secondary | ICD-10-CM | POA: Diagnosis not present

## 2013-10-12 DIAGNOSIS — J449 Chronic obstructive pulmonary disease, unspecified: Secondary | ICD-10-CM | POA: Insufficient documentation

## 2013-10-12 DIAGNOSIS — S0003XA Contusion of scalp, initial encounter: Secondary | ICD-10-CM | POA: Insufficient documentation

## 2013-10-12 DIAGNOSIS — S0180XA Unspecified open wound of other part of head, initial encounter: Secondary | ICD-10-CM | POA: Diagnosis not present

## 2013-10-12 DIAGNOSIS — J4489 Other specified chronic obstructive pulmonary disease: Secondary | ICD-10-CM | POA: Insufficient documentation

## 2013-10-12 DIAGNOSIS — S46909A Unspecified injury of unspecified muscle, fascia and tendon at shoulder and upper arm level, unspecified arm, initial encounter: Secondary | ICD-10-CM | POA: Diagnosis not present

## 2013-10-12 DIAGNOSIS — H409 Unspecified glaucoma: Secondary | ICD-10-CM | POA: Insufficient documentation

## 2013-10-12 DIAGNOSIS — E785 Hyperlipidemia, unspecified: Secondary | ICD-10-CM | POA: Diagnosis not present

## 2013-10-12 DIAGNOSIS — W19XXXA Unspecified fall, initial encounter: Secondary | ICD-10-CM

## 2013-10-12 DIAGNOSIS — S0121XA Laceration without foreign body of nose, initial encounter: Secondary | ICD-10-CM

## 2013-10-12 DIAGNOSIS — S0993XA Unspecified injury of face, initial encounter: Secondary | ICD-10-CM | POA: Diagnosis not present

## 2013-10-12 DIAGNOSIS — W1809XA Striking against other object with subsequent fall, initial encounter: Secondary | ICD-10-CM | POA: Insufficient documentation

## 2013-10-12 DIAGNOSIS — S8002XA Contusion of left knee, initial encounter: Secondary | ICD-10-CM

## 2013-10-12 HISTORY — DX: Unspecified glaucoma: H40.9

## 2013-10-12 HISTORY — DX: Chronic obstructive pulmonary disease, unspecified: J44.9

## 2013-10-12 NOTE — Discharge Instructions (Signed)
Contusion A contusion is a deep bruise. Contusions happen when an injury causes bleeding under the skin. Signs of bruising include pain, puffiness (swelling), and discolored skin. The contusion may turn blue, purple, or yellow. HOME CARE   Put ice on the injured area.  Put ice in a plastic bag.  Place a towel between your skin and the bag.  Leave the ice on for 15-20 minutes, 03-04 times a day.  Only take medicine as told by your doctor.  Rest the injured area.  If possible, raise (elevate) the injured area to lessen puffiness. GET HELP RIGHT AWAY IF:   You have more bruising or puffiness.  You have pain that is getting worse.  Your puffiness or pain is not helped by medicine. MAKE SURE YOU:   Understand these instructions.  Will watch your condition.  Will get help right away if you are not doing well or get worse. Document Released: 09/30/2007 Document Revised: 07/06/2011 Document Reviewed: 02/16/2011 [facility] Patient Information 2015 [place], [place]. This information is not intended to replace advice given to you by your health care provider. Make sure you discuss any questions you have with your health care provider.  Facial Laceration A facial laceration is a cut on the face. These injuries can be painful and cause bleeding. Some cuts may need to be closed with stitches (sutures), skin adhesive strips, or wound glue. Cuts usually heal quickly but can leave a scar. It can take 1-2 years for the scar to go away completely. HOME CARE   Only take medicines as told by your doctor.  Follow your doctor's instructions for wound care. For Stitches:  Keep the cut clean and dry.  If you have a bandage (dressing), change it at least once a day. Change the bandage if it gets wet or dirty, or as told by your doctor.  Wash the cut with soap and water 2 times a day. Rinse the cut with water. Pat it dry with a clean towel.  Put a thin layer of medicated cream on the cut as told  by your doctor.  You may shower after the first 24 hours. Do not soak the cut in water until the stitches are removed.  Have your stitches removed as told by your doctor.  Do not wear any makeup until a few days after your stitches are removed. For Skin Adhesive Strips:  Keep the cut clean and dry.  Do not get the strips wet. You may take a bath, but be careful to keep the cut dry.  If the cut gets wet, pat it dry with a clean towel.  The strips will fall off on their own. Do not remove the strips that are still stuck to the cut. For Wound Glue:  You may shower or take baths. Do not soak or scrub the cut. Do not swim. Avoid heavy sweating until the glue falls off on its own. After a shower or bath, pat the cut dry with a clean towel.  Do not put medicine or makeup on your cut until the glue falls off.  If you have a bandage, do not put tape over the glue.  Avoid lots of sunlight or tanning lamps until the glue falls off.  The glue will fall off on its own in 5-10 days. Do not pick at the glue. After Healing: Put sunscreen on the cut for the first year to reduce your scar. GET HELP RIGHT AWAY IF:   Your cut area gets red, painful,  or puffy (swollen).  You see a yellowish-white fluid (pus) coming from the cut.  You have chills or a fever. MAKE SURE YOU:   Understand these instructions.  Will watch your condition.  Will get help right away if you are not doing well or get worse. Document Released: 09/30/2007 Document Revised: 02/01/2013 Document Reviewed: 11/24/2012 [facility] Patient Information 2015 [place], [place]. This information is not intended to replace advice given to you by your health care provider. Make sure you discuss any questions you have with your health care provider.  Fall Prevention and Home Safety Falls cause injuries and can affect all age groups. It is possible to prevent falls.  HOW TO PREVENT FALLS  Wear shoes with rubber soles that do not have an  opening for your toes.  Keep the inside and outside of your house well lit.  Use night lights throughout your home.  Remove clutter from floors.  Clean up floor spills.  Remove throw rugs or fasten them to the floor with carpet tape.  Do not place electrical cords across pathways.  Put grab bars by your tub, shower, and toilet. Do not use towel bars as grab bars.  Put handrails on both sides of the stairway. Fix loose handrails.  Do not climb on stools or stepladders, if possible.  Do not wax your floors.  Repair uneven or unsafe sidewalks, walkways, or stairs.  Keep items you use a lot within reach.  Be aware of pets.  Keep emergency numbers next to the telephone.  Put smoke detectors in your home and near bedrooms. Ask your doctor what other things you can do to prevent falls. Document Released: 02/07/2009 Document Revised: 10/13/2011 Document Reviewed: 07/14/2011 [facility] Patient Information 2015 [place], [place]. This information is not intended to replace advice given to you by your health care provider. Make sure you discuss any questions you have with your health care provider.

## 2013-10-12 NOTE — ED Provider Notes (Signed)
CSN: [id number]     Arrival date & time 10/12/13  1129 History   First MD Initiated Contact with Patient 10/12/13 1135     Chief Complaint  Patient presents with  . Fall      HPI [place] [facility], per report pt tripped over curb & hit face on brick building, pt c/o pain to L cheek & nose area, denies LOC, A&O x 4, follows commands, speaks in complete sentences, denies neck & back pain.  Does have some complain left shoulder pain.  Denies hip pain or back pain.  Past Medical History  Diagnosis Date  . Hypertension   . Diabetes mellitus   . Hyperlipidemia   . COPD (chronic obstructive pulmonary disease)   . Glaucoma    Past Surgical History  Procedure Laterality Date  . Rupture disk  1970"s  . Cholecystectomy  2000  . Shoulder arthroscopy with rotator cuff repair  1999    rt shoulder  . Hernia repair  2007   Family History  Problem Relation Age of Onset  . Stroke Mother    History  Substance Use Topics  . Smoking status: Never Smoker   . Smokeless tobacco: Not on file  . Alcohol Use: Not on file   OB History   Grav Para Term Preterm Abortions TAB SAB Ect Mult Living                 Review of Systems  All other systems reviewed and are negative  Allergies  Review of patient's allergies indicates no known allergies.  Home Medications   Prior to Admission medications   Medication Sig Start Date End Date Taking? Authorizing Provider  atorvastatin (LIPITOR) 10 MG tablet Take 10 mg by mouth daily.   Yes Historical Provider, MD  glipiZIDE (GLUCOTROL) 5 MG tablet Take 5 mg by mouth daily.    Yes Historical Provider, MD  glucose blood test strip 1 each by Other route as needed for other. Use as instructed   Yes Historical Provider, MD  hydrochlorothiazide (HYDRODIURIL) 25 MG tablet Take 25 mg by mouth daily.   Yes Historical Provider, MD  ibuprofen (ADVIL,MOTRIN) 200 MG tablet Take 200 mg by mouth every 6 (six) hours as needed for mild pain.   Yes  Historical Provider, MD  latanoprost (XALATAN) 0.005 % ophthalmic solution Place 1 drop into both eyes at bedtime.    Yes Historical Provider, MD  Liraglutide (VICTOZA ) Inject 1 application into the skin daily.   Yes Historical Provider, MD  metFORMIN (GLUCOPHAGE) 1000 MG tablet Take 1,000 mg by mouth daily.    Yes Historical Provider, MD  Multiple Vitamins-Minerals (MULTIVITAMIN WITH MINERALS) tablet Take 1 tablet by mouth daily.   Yes Historical Provider, MD  pioglitazone (ACTOS) 30 MG tablet Take 15 mg by mouth daily.   Yes Historical Provider, MD  telmisartan (MICARDIS) 80 MG tablet Take 80 mg by mouth daily.   Yes Historical Provider, MD   BP 133/50  Pulse 80  Temp(Src) 98.3 F (36.8 C) (Oral)  Resp 20  SpO2 96% Physical Exam  Nursing note and vitals reviewed. Constitutional: She is oriented to person, place, and time. She appears well-developed and well-nourished. No distress.  HENT:  Head: Normocephalic.    Eyes: Pupils are equal, round, and reactive to light.  Neck: Normal range of motion.  Cardiovascular: Normal rate and intact distal pulses.   Pulmonary/Chest: No respiratory distress.  Abdominal: Normal appearance. She exhibits no distension.  Musculoskeletal: Normal range of motion.  Neurological: She is alert and oriented to person, place, and time. No cranial nerve deficit.  Skin: Skin is warm and dry. No rash noted.  Psychiatric: She has a normal mood and affect. Her behavior is normal.    ED Course  Procedures (including critical care time) Labs Review Labs Reviewed - No data to display   Patient will undergo CT scan because of [place] head CT rules   Imaging Review Dg Nasal Bones  10/12/2013   CLINICAL DATA:  FALL FALL  EXAM: NASAL BONES - 3+ VIEW  COMPARISON:  None.  FINDINGS: There is no evidence of fracture or other bone abnormality.  IMPRESSION: Negative.   Electronically Signed   By: Margaree Mackintosh M.D.   On: 10/12/2013 13:32   Ct Head Wo  Contrast  10/12/2013   CLINICAL DATA:  Head injury post fall  EXAM: CT HEAD WITHOUT CONTRAST  TECHNIQUE: Contiguous axial images were obtained from the base of the skull through the vertex without intravenous contrast.  COMPARISON:  None.  FINDINGS: No skull fracture is noted. There is scalp swelling and subcutaneous stranding in left frontal region. Small subcutaneous hematoma measures 1.9 length by 4 mm thickness. No intracranial hemorrhage, mass effect or midline shift. Mild cerebral atrophy. No acute cortical infarction. No mass lesion is noted on this unenhanced scan. Periventricular white matter decreased attenuation probable due to chronic small vessel ischemic changes.  IMPRESSION: No acute intracranial abnormality. There is scalp swelling and small scalp hematoma in left frontal region. Mild cerebral atrophy.   Electronically Signed   By: Lahoma Crocker M.D.   On: 10/12/2013 13:36   Dg Shoulder Left  10/12/2013   CLINICAL DATA:  [place].  Injured left shoulder.  EXAM: LEFT SHOULDER - 2+ VIEW  COMPARISON:  None.  FINDINGS: There are mild AC joint and glenohumeral joint degenerative changes but no acute fracture. Calcification is noted in the subacromial region which could be in the rotator cuff or bursa. The visualized left lung is clear.  IMPRESSION: No acute bony findings.  No dislocation.  Mild degenerative changes and calcific tendinitis versus bursitis.   Electronically Signed   By: Kalman Jewels M.D.   On: 10/12/2013 13:32      MDM   Final diagnoses:  Fall  Facial contusion  Laceration of nose  Contusion of knee, left        Dot Lanes, MD 10/12/13 1401

## 2013-10-12 NOTE — ED Notes (Addendum)
Patient transported to X-ray 

## 2013-10-12 NOTE — ED Notes (Signed)
Pt in [place] via [facility], per report pt tripped over curb & hit face on brick building, pt c/o pain to L cheek & nose area, denies LOC, A&O x 4, follows commands, speaks in complete sentences, denies neck & back pain

## 2013-11-20 DIAGNOSIS — I10 Essential (primary) hypertension: Secondary | ICD-10-CM | POA: Diagnosis not present

## 2013-11-20 DIAGNOSIS — E119 Type 2 diabetes mellitus without complications: Secondary | ICD-10-CM | POA: Diagnosis not present

## 2013-11-29 DIAGNOSIS — E7800 Pure hypercholesterolemia, unspecified: Secondary | ICD-10-CM | POA: Diagnosis not present

## 2013-11-29 DIAGNOSIS — E119 Type 2 diabetes mellitus without complications: Secondary | ICD-10-CM | POA: Diagnosis not present

## 2013-11-29 DIAGNOSIS — I10 Essential (primary) hypertension: Secondary | ICD-10-CM | POA: Diagnosis not present

## 2013-11-30 DIAGNOSIS — D235 Other benign neoplasm of skin of trunk: Secondary | ICD-10-CM | POA: Diagnosis not present

## 2013-11-30 DIAGNOSIS — L570 Actinic keratosis: Secondary | ICD-10-CM | POA: Diagnosis not present

## 2013-11-30 DIAGNOSIS — D045 Carcinoma in situ of skin of trunk: Secondary | ICD-10-CM | POA: Diagnosis not present

## 2013-11-30 DIAGNOSIS — Z85828 Personal history of other malignant neoplasm of skin: Secondary | ICD-10-CM | POA: Diagnosis not present

## 2013-12-12 DIAGNOSIS — R079 Chest pain, unspecified: Secondary | ICD-10-CM | POA: Diagnosis not present

## 2013-12-12 DIAGNOSIS — E662 Morbid (severe) obesity with alveolar hypoventilation: Secondary | ICD-10-CM | POA: Diagnosis not present

## 2013-12-12 DIAGNOSIS — I10 Essential (primary) hypertension: Secondary | ICD-10-CM | POA: Diagnosis not present

## 2013-12-12 DIAGNOSIS — G4733 Obstructive sleep apnea (adult) (pediatric): Secondary | ICD-10-CM | POA: Diagnosis not present

## 2014-01-23 DIAGNOSIS — E119 Type 2 diabetes mellitus without complications: Secondary | ICD-10-CM | POA: Diagnosis not present

## 2014-01-23 DIAGNOSIS — H4011X0 Primary open-angle glaucoma, stage unspecified: Secondary | ICD-10-CM | POA: Diagnosis not present

## 2014-01-23 DIAGNOSIS — H409 Unspecified glaucoma: Secondary | ICD-10-CM | POA: Diagnosis not present

## 2014-03-02 DIAGNOSIS — E139 Other specified diabetes mellitus without complications: Secondary | ICD-10-CM | POA: Diagnosis not present

## 2014-03-02 DIAGNOSIS — I10 Essential (primary) hypertension: Secondary | ICD-10-CM | POA: Diagnosis not present

## 2014-03-04 IMAGING — CR DG LUMBAR SPINE COMPLETE 4+V
5 series · 5 of 5 positions shown · non-contrast
Comparison: CT 11/14/2010

CLINICAL DATA: Left hip pain.  Low back pain.

LUMBAR SPINE - COMPLETE 4+ VIEW

[view not recorded (1 of 5)]
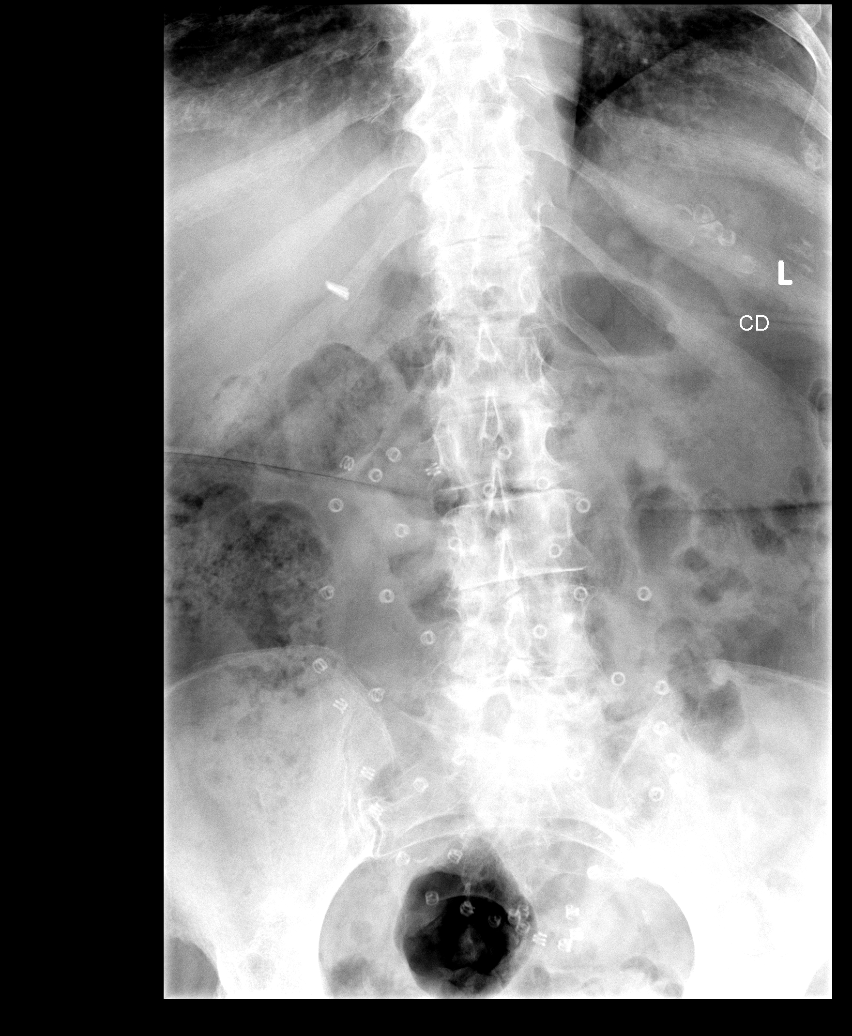

[view not recorded (2 of 5)]
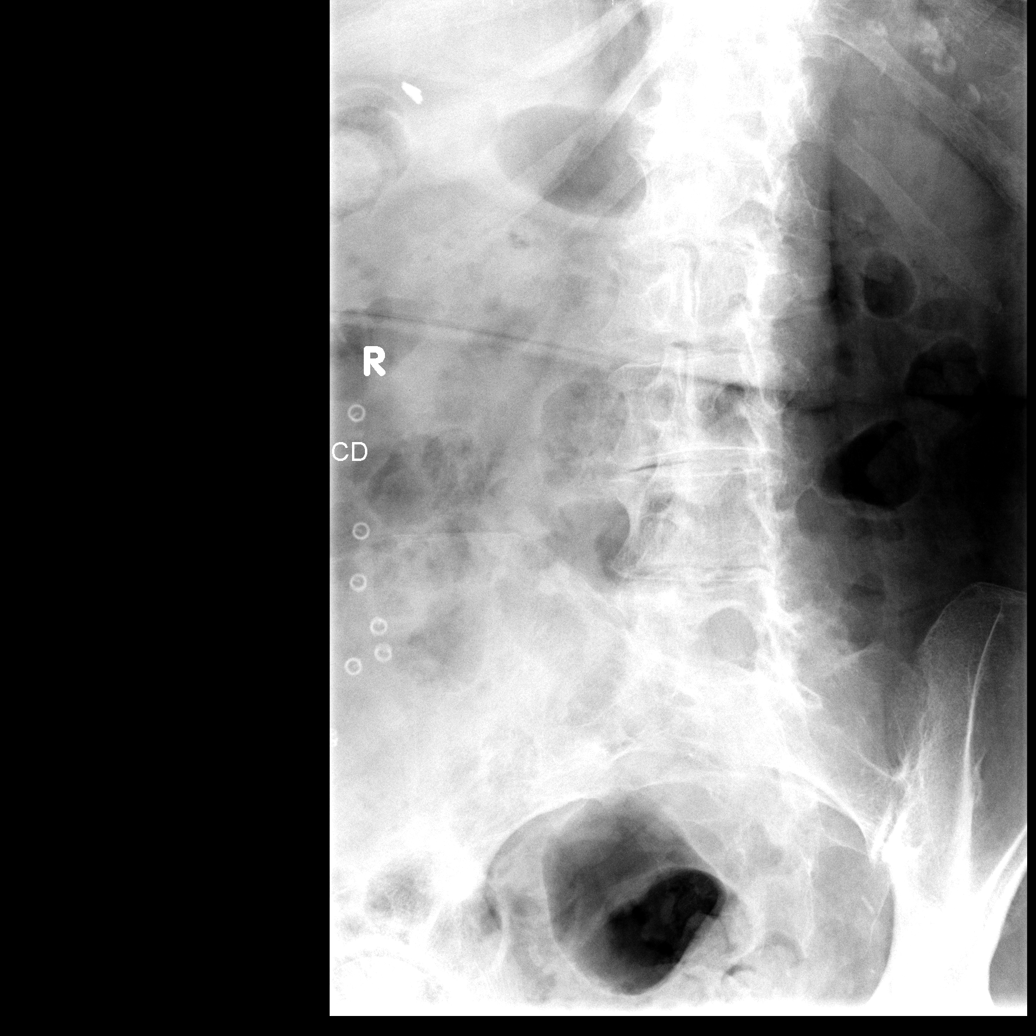

[view not recorded (3 of 5)]
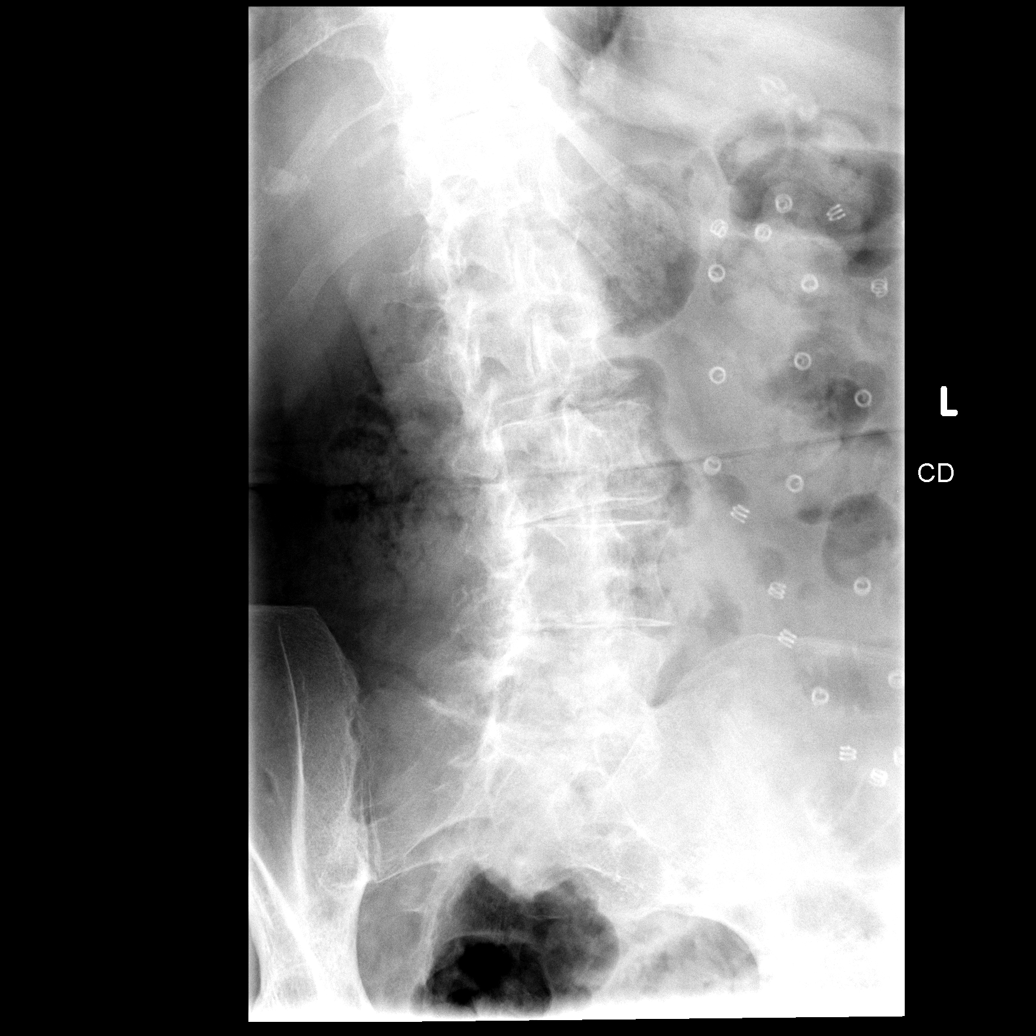

[view not recorded (4 of 5)]
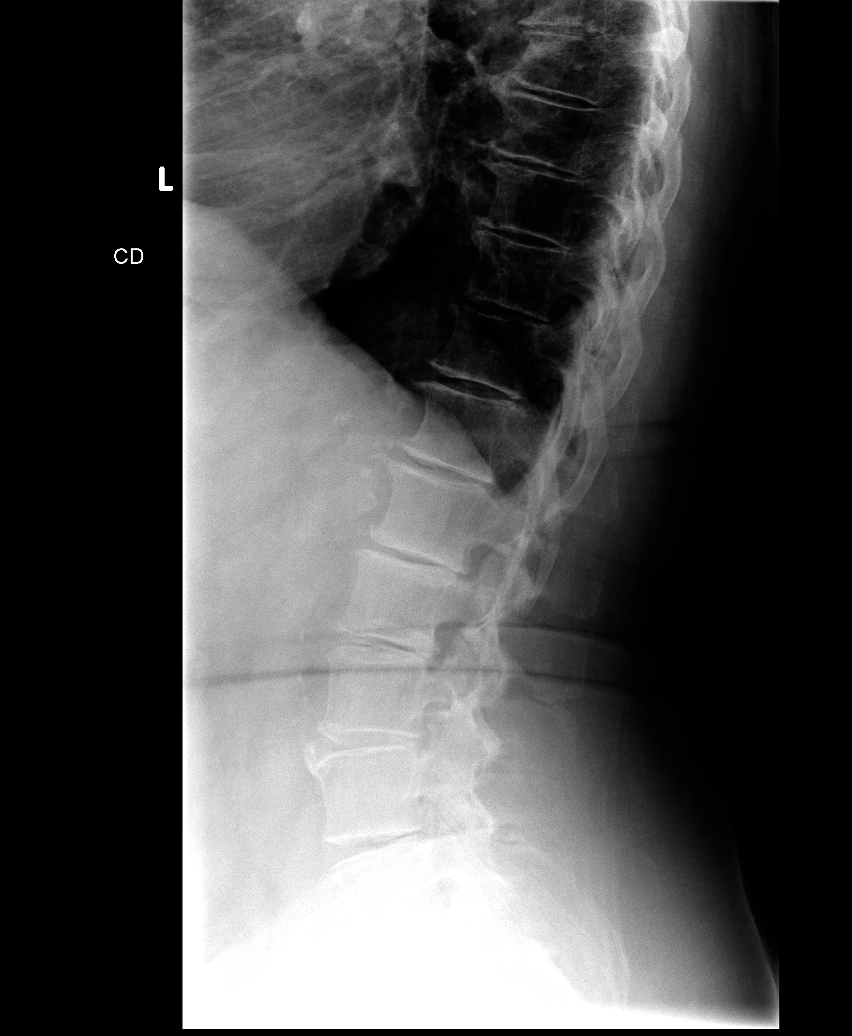

[view not recorded (5 of 5)]
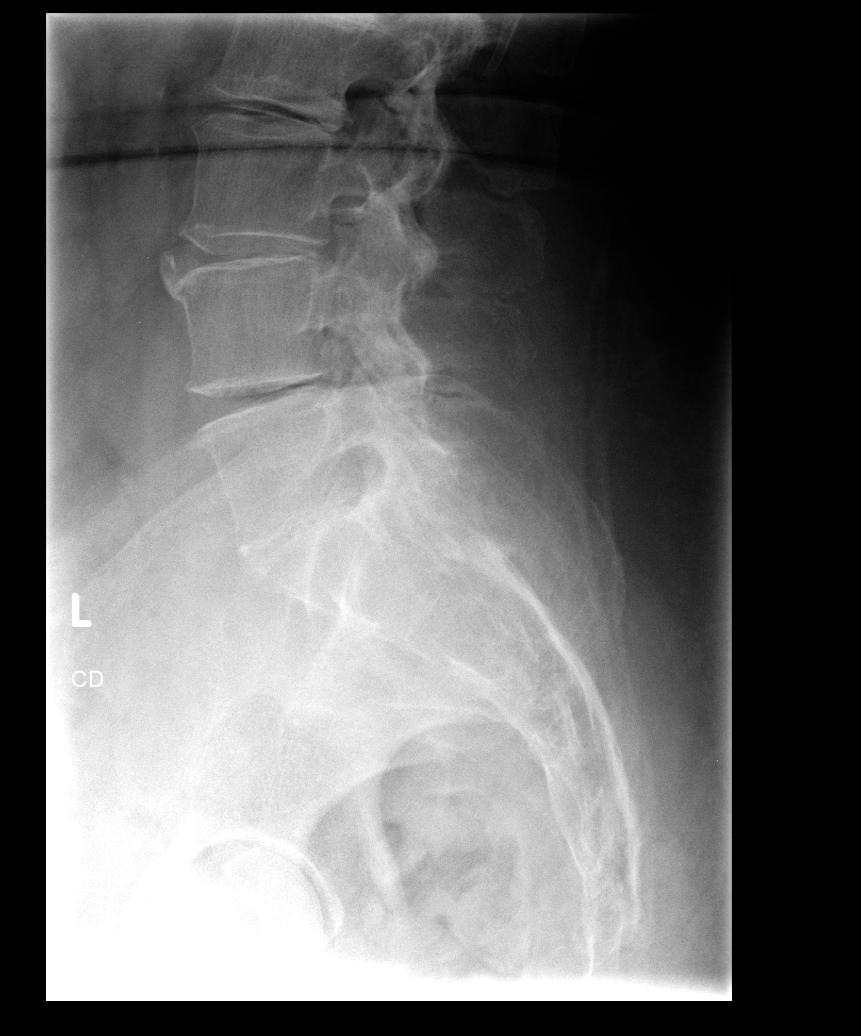

[5 of 5 positions shown; findings below may reference images not displayed]

FINDINGS: Degenerative disc disease changes and facet disease
throughout the lumbar spine and visualized lower thoracic spine.
Disc space narrowing, mild spurring and vacuum disc noted at
multiple levels.  Normal alignment.  No fracture.  SI joints are
symmetric and unremarkable.
IMPRESSION: Diffuse spondylosis as above.  No acute findings.

## 2014-03-16 DIAGNOSIS — X32XXXD Exposure to sunlight, subsequent encounter: Secondary | ICD-10-CM | POA: Diagnosis not present

## 2014-03-16 DIAGNOSIS — Z85828 Personal history of other malignant neoplasm of skin: Secondary | ICD-10-CM | POA: Diagnosis not present

## 2014-03-16 DIAGNOSIS — L570 Actinic keratosis: Secondary | ICD-10-CM | POA: Diagnosis not present

## 2014-03-16 DIAGNOSIS — Z08 Encounter for follow-up examination after completed treatment for malignant neoplasm: Secondary | ICD-10-CM | POA: Diagnosis not present

## 2014-03-28 DIAGNOSIS — E780 Pure hypercholesterolemia: Secondary | ICD-10-CM | POA: Diagnosis not present

## 2014-03-28 DIAGNOSIS — I10 Essential (primary) hypertension: Secondary | ICD-10-CM | POA: Diagnosis not present

## 2014-03-28 DIAGNOSIS — E119 Type 2 diabetes mellitus without complications: Secondary | ICD-10-CM | POA: Diagnosis not present

## 2014-04-02 DIAGNOSIS — X32XXXD Exposure to sunlight, subsequent encounter: Secondary | ICD-10-CM | POA: Diagnosis not present

## 2014-04-02 DIAGNOSIS — L570 Actinic keratosis: Secondary | ICD-10-CM | POA: Diagnosis not present

## 2014-06-18 DIAGNOSIS — M1712 Unilateral primary osteoarthritis, left knee: Secondary | ICD-10-CM | POA: Diagnosis not present

## 2014-07-30 DIAGNOSIS — Z79899 Other long term (current) drug therapy: Secondary | ICD-10-CM | POA: Diagnosis not present

## 2014-07-30 DIAGNOSIS — E785 Hyperlipidemia, unspecified: Secondary | ICD-10-CM | POA: Diagnosis not present

## 2014-07-30 DIAGNOSIS — I10 Essential (primary) hypertension: Secondary | ICD-10-CM | POA: Diagnosis not present

## 2014-07-30 DIAGNOSIS — E118 Type 2 diabetes mellitus with unspecified complications: Secondary | ICD-10-CM | POA: Diagnosis not present

## 2014-08-06 DIAGNOSIS — I10 Essential (primary) hypertension: Secondary | ICD-10-CM | POA: Diagnosis not present

## 2014-08-06 DIAGNOSIS — E119 Type 2 diabetes mellitus without complications: Secondary | ICD-10-CM | POA: Diagnosis not present

## 2014-08-06 DIAGNOSIS — E780 Pure hypercholesterolemia: Secondary | ICD-10-CM | POA: Diagnosis not present

## 2014-08-20 DIAGNOSIS — H4011X1 Primary open-angle glaucoma, mild stage: Secondary | ICD-10-CM | POA: Diagnosis not present

## 2014-08-20 DIAGNOSIS — H2513 Age-related nuclear cataract, bilateral: Secondary | ICD-10-CM | POA: Diagnosis not present

## 2014-08-22 ENCOUNTER — Ambulatory Visit
Admission: RE | Admit: 2014-08-22 | Discharge: 2014-08-22 | Disposition: A | Discharge status: Home / Self Care | Payer: Medicare Other | Source: Ambulatory Visit | Attending: Internal Medicine | Admitting: Internal Medicine

## 2014-08-22 ENCOUNTER — Other Ambulatory Visit: Payer: Self-pay | Admitting: Internal Medicine

## 2014-08-22 DIAGNOSIS — M79605 Pain in left leg: Secondary | ICD-10-CM

## 2014-08-22 DIAGNOSIS — M79662 Pain in left lower leg: Secondary | ICD-10-CM | POA: Diagnosis not present

## 2014-08-29 ENCOUNTER — Other Ambulatory Visit: Payer: Self-pay | Admitting: *Deleted

## 2014-08-29 DIAGNOSIS — I83812 Varicose veins of left lower extremities with pain: Secondary | ICD-10-CM

## 2014-09-06 DIAGNOSIS — M549 Dorsalgia, unspecified: Secondary | ICD-10-CM | POA: Diagnosis not present

## 2014-09-06 DIAGNOSIS — M546 Pain in thoracic spine: Secondary | ICD-10-CM | POA: Diagnosis not present

## 2014-09-17 DIAGNOSIS — I10 Essential (primary) hypertension: Secondary | ICD-10-CM | POA: Diagnosis not present

## 2014-09-17 DIAGNOSIS — E119 Type 2 diabetes mellitus without complications: Secondary | ICD-10-CM | POA: Diagnosis not present

## 2014-09-17 DIAGNOSIS — M47816 Spondylosis without myelopathy or radiculopathy, lumbar region: Secondary | ICD-10-CM | POA: Diagnosis not present

## 2014-09-17 DIAGNOSIS — M549 Dorsalgia, unspecified: Secondary | ICD-10-CM | POA: Diagnosis not present

## 2014-09-17 DIAGNOSIS — E780 Pure hypercholesterolemia: Secondary | ICD-10-CM | POA: Diagnosis not present

## 2014-10-01 ENCOUNTER — Encounter: Payer: Self-pay | Admitting: Vascular Surgery

## 2014-10-01 DIAGNOSIS — Z79899 Other long term (current) drug therapy: Secondary | ICD-10-CM | POA: Diagnosis not present

## 2014-10-01 DIAGNOSIS — M1990 Unspecified osteoarthritis, unspecified site: Secondary | ICD-10-CM | POA: Diagnosis not present

## 2014-10-01 DIAGNOSIS — M549 Dorsalgia, unspecified: Secondary | ICD-10-CM | POA: Diagnosis not present

## 2014-10-01 DIAGNOSIS — G894 Chronic pain syndrome: Secondary | ICD-10-CM | POA: Diagnosis not present

## 2014-10-01 DIAGNOSIS — M4697 Unspecified inflammatory spondylopathy, lumbosacral region: Secondary | ICD-10-CM | POA: Diagnosis not present

## 2014-10-01 DIAGNOSIS — M545 Low back pain: Secondary | ICD-10-CM | POA: Diagnosis not present

## 2014-10-02 ENCOUNTER — Ambulatory Visit (HOSPITAL_COMMUNITY)
Admission: RE | Admit: 2014-10-02 | Discharge: 2014-10-02 | Disposition: A | Discharge status: Home / Self Care | Payer: Medicare Other | Source: Ambulatory Visit | Attending: Vascular Surgery | Admitting: Vascular Surgery

## 2014-10-02 ENCOUNTER — Ambulatory Visit (INDEPENDENT_AMBULATORY_CARE_PROVIDER_SITE_OTHER): Payer: Medicare Other | Admitting: Vascular Surgery

## 2014-10-02 ENCOUNTER — Encounter: Payer: Self-pay | Admitting: Vascular Surgery

## 2014-10-02 VITALS — BP 132/75 | HR 79 | Resp 16 | Ht 64.0 in | Wt 225.0 lb

## 2014-10-02 DIAGNOSIS — I83892 Varicose veins of left lower extremities with other complications: Secondary | ICD-10-CM | POA: Diagnosis not present

## 2014-10-02 DIAGNOSIS — I83812 Varicose veins of left lower extremities with pain: Secondary | ICD-10-CM | POA: Insufficient documentation

## 2014-10-02 NOTE — Progress Notes (Signed)
Subjective:     Patient ID: [name], female   DOB: 10/09/1930, 81 y.o.   MRN: [id number]  HPI this 81 year old female was referred by Dr. [name] for evaluation of painful varicosities left leg. Patient has had bulging varicosities from the knee to the ankle of the left leg for several years. These have aggressively enlarged and have caused aching throbbing and burning discomfort as well as distal edema throughout the day. In April 2016 she developed cellulitis surrounding these bulging varicosities requiring anabiotic treatment by Dr. Maudie Mercury. This eventually resolved. She was prescribed long-leg elastic compression stockings at our office 20-30 millimeter gradient which she has been wearing for the past year without improvement. She has no history of DVT, thrombophlebitis, stasis ulcers, or bleeding. She has no symptoms in the contralateral right leg.  Past Medical History  Diagnosis Date  . Hypertension   . Diabetes mellitus   . Hyperlipidemia   . COPD (chronic obstructive pulmonary disease)   . Glaucoma     History  Substance Use Topics  . Smoking status: Never Smoker   . Smokeless tobacco: Not on file  . Alcohol Use: Not on file    Family History  Problem Relation Age of Onset  . Stroke Mother     No Known Allergies   Current outpatient prescriptions:  .  glipiZIDE (GLUCOTROL) 5 MG tablet, Take 5 mg by mouth daily. , Disp: , Rfl:  .  glucose blood test strip, 1 each by Other route as needed for other. Use as instructed, Disp: , Rfl:  .  hydrochlorothiazide (HYDRODIURIL) 25 MG tablet, Take 25 mg by mouth daily., Disp: , Rfl:  .  latanoprost (XALATAN) 0.005 % ophthalmic solution, Place 1 drop into both eyes at bedtime. , Disp: , Rfl:  .  Liraglutide (VICTOZA [place]), Inject 1 application into the skin daily., Disp: , Rfl:  .  metFORMIN (GLUCOPHAGE) 1000 MG tablet, Take 1,000 mg by mouth daily. , Disp: , Rfl:  .  pioglitazone (ACTOS) 30 MG tablet, Take 15 mg by mouth daily.,  Disp: , Rfl:  .  atorvastatin (LIPITOR) 10 MG tablet, Take 10 mg by mouth daily., Disp: , Rfl:  .  ibuprofen (ADVIL,MOTRIN) 200 MG tablet, Take 200 mg by mouth every 6 (six) hours as needed for mild pain., Disp: , Rfl:  .  Multiple Vitamins-Minerals (MULTIVITAMIN WITH MINERALS) tablet, Take 1 tablet by mouth daily., Disp: , Rfl:  .  telmisartan (MICARDIS) 80 MG tablet, Take 80 mg by mouth daily., Disp: , Rfl:   Filed Vitals:   10/02/14 1335  BP: 132/75  Pulse: 79  Resp: 16  Height: 5\' 4"  (1.626 m)  Weight: 225 lb (102.059 kg)    Body mass index is 38.6 kg/(m^2).         Review of Systems has COPD and requires home oxygen at night, denies chest pain but does have dyspnea on exertion. No history of smoking but received secondary smoke at work for approximately 40 years she states. Type 2 diabetes mellitus well controlled, other systems negative and a complete review of systems     Objective:   Physical Exam BP 132/75 mmHg  Pulse 79  Resp 16  Ht 5\' 4"  (1.626 m)  Wt 225 lb (102.059 kg)  BMI 38.60 kg/m2  Gen.-alert and oriented x3 in no apparent distress-obese  HEENT normal for age Lungs no rhonchi or wheezing Cardiovascular regular rhythm no murmurs carotid pulses 3+ palpable no bruits audible Abdomen soft nontender  no palpable masses Musculoskeletal free of  major deformities Skin clear -no rashes Neurologic normal Lower extremities 3+ femoral and dorsalis pedis pulses palpable bilaterally with no edema on the right 1+ edema on the left Bulging varicosities left leg from knee to medial malleolus with intradermal reticular quite superficial veins in the pretibial region with no evidence of ulceration or bleeding. Mild hyperpigmentation distally but no active ulcer noted. Right leg relatively free of varicosities with minimal distal edema.  Today I ordered a venous duplex exam of the left leg which I reviewed and interpreted. There is no DVT. There is gross reflux in the  left great saphenous vein from the knee to the saphenofemoral junction and the vein is large caliber. It supplies the bulging varicosities in the calf region.       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 Patient has chronic pain not relieved by long leg elastic compression stockings, elevation, or ibuprofen. This is affecting her daily living and she would like this treated to prevent further episodes of cellulitis and pain    Plan:     Patient needs laser ablation left great saphenous vein. She then needs to return in 3 months to see if stab phlebectomy and sclerotherapy of these intradermal reticular veins will be indicated. We'll proceed with precertification since patient has failed conservative management including long-leg elastic compression stockings for the past 12 months as well as elevation and ibuprofen.

## 2014-10-05 ENCOUNTER — Other Ambulatory Visit: Payer: Self-pay | Admitting: Physician Assistant

## 2014-10-05 DIAGNOSIS — M545 Low back pain: Secondary | ICD-10-CM

## 2014-10-15 ENCOUNTER — Other Ambulatory Visit: Payer: Medicare Other | Admitting: Vascular Surgery

## 2014-10-22 ENCOUNTER — Ambulatory Visit: Payer: Medicare Other | Admitting: Vascular Surgery

## 2014-10-22 ENCOUNTER — Encounter (HOSPITAL_COMMUNITY): Payer: Medicare Other

## 2014-10-22 ENCOUNTER — Ambulatory Visit
Admission: RE | Admit: 2014-10-22 | Discharge: 2014-10-22 | Disposition: A | Discharge status: Home / Self Care | Payer: Medicare Other | Source: Ambulatory Visit | Attending: Physician Assistant | Admitting: Physician Assistant

## 2014-10-22 DIAGNOSIS — M545 Low back pain: Secondary | ICD-10-CM

## 2014-10-22 DIAGNOSIS — M5124 Other intervertebral disc displacement, thoracic region: Secondary | ICD-10-CM | POA: Diagnosis not present

## 2014-10-22 DIAGNOSIS — M47814 Spondylosis without myelopathy or radiculopathy, thoracic region: Secondary | ICD-10-CM | POA: Diagnosis not present

## 2014-10-22 DIAGNOSIS — M4184 Other forms of scoliosis, thoracic region: Secondary | ICD-10-CM | POA: Diagnosis not present

## 2014-10-27 ENCOUNTER — Other Ambulatory Visit: Payer: Medicare Other

## 2014-10-31 DIAGNOSIS — M549 Dorsalgia, unspecified: Secondary | ICD-10-CM | POA: Diagnosis not present

## 2014-10-31 DIAGNOSIS — M545 Low back pain: Secondary | ICD-10-CM | POA: Diagnosis not present

## 2014-10-31 DIAGNOSIS — M1990 Unspecified osteoarthritis, unspecified site: Secondary | ICD-10-CM | POA: Diagnosis not present

## 2014-10-31 DIAGNOSIS — Z79899 Other long term (current) drug therapy: Secondary | ICD-10-CM | POA: Diagnosis not present

## 2014-10-31 DIAGNOSIS — G894 Chronic pain syndrome: Secondary | ICD-10-CM | POA: Diagnosis not present

## 2014-10-31 DIAGNOSIS — M4697 Unspecified inflammatory spondylopathy, lumbosacral region: Secondary | ICD-10-CM | POA: Diagnosis not present

## 2014-11-14 DIAGNOSIS — I10 Essential (primary) hypertension: Secondary | ICD-10-CM | POA: Diagnosis not present

## 2014-11-14 DIAGNOSIS — E119 Type 2 diabetes mellitus without complications: Secondary | ICD-10-CM | POA: Diagnosis not present

## 2014-11-21 DIAGNOSIS — N289 Disorder of kidney and ureter, unspecified: Secondary | ICD-10-CM | POA: Diagnosis not present

## 2014-11-21 DIAGNOSIS — Z79899 Other long term (current) drug therapy: Secondary | ICD-10-CM | POA: Diagnosis not present

## 2014-11-21 DIAGNOSIS — E1140 Type 2 diabetes mellitus with diabetic neuropathy, unspecified: Secondary | ICD-10-CM | POA: Diagnosis not present

## 2014-11-21 DIAGNOSIS — D649 Anemia, unspecified: Secondary | ICD-10-CM | POA: Diagnosis not present

## 2014-11-21 DIAGNOSIS — I10 Essential (primary) hypertension: Secondary | ICD-10-CM | POA: Diagnosis not present

## 2014-11-22 ENCOUNTER — Encounter: Payer: Self-pay | Admitting: Vascular Surgery

## 2014-11-22 ENCOUNTER — Telehealth: Payer: Self-pay | Admitting: *Deleted

## 2014-11-22 NOTE — Telephone Encounter (Signed)
Returned call to patient concerning a procedure appt which she thought was August 2.  I explained to her that the procedure will be scheduled at that time.  Patient voiced understanding of the information.

## 2014-11-27 ENCOUNTER — Ambulatory Visit: Payer: Medicare Other | Admitting: Vascular Surgery

## 2014-11-27 ENCOUNTER — Other Ambulatory Visit: Payer: Self-pay | Admitting: *Deleted

## 2014-11-27 DIAGNOSIS — I83812 Varicose veins of left lower extremities with pain: Secondary | ICD-10-CM

## 2014-12-04 DIAGNOSIS — K5730 Diverticulosis of large intestine without perforation or abscess without bleeding: Secondary | ICD-10-CM | POA: Diagnosis not present

## 2014-12-04 DIAGNOSIS — Z1211 Encounter for screening for malignant neoplasm of colon: Secondary | ICD-10-CM | POA: Diagnosis not present

## 2014-12-04 DIAGNOSIS — Z86010 Personal history of colonic polyps: Secondary | ICD-10-CM | POA: Diagnosis not present

## 2014-12-07 ENCOUNTER — Encounter: Payer: Self-pay | Admitting: Vascular Surgery

## 2014-12-10 ENCOUNTER — Ambulatory Visit (INDEPENDENT_AMBULATORY_CARE_PROVIDER_SITE_OTHER): Payer: Medicare Other | Admitting: Vascular Surgery

## 2014-12-10 ENCOUNTER — Encounter: Payer: Self-pay | Admitting: Vascular Surgery

## 2014-12-10 VITALS — BP 168/76 | HR 98 | Temp 97.8°F | Resp 16 | Ht 64.0 in | Wt 225.0 lb

## 2014-12-10 DIAGNOSIS — I83892 Varicose veins of left lower extremities with other complications: Secondary | ICD-10-CM | POA: Diagnosis not present

## 2014-12-10 DIAGNOSIS — I83899 Varicose veins of unspecified lower extremities with other complications: Secondary | ICD-10-CM | POA: Insufficient documentation

## 2014-12-10 NOTE — Progress Notes (Signed)
Filed Vitals:   12/10/14 1247 12/10/14 1248  BP: 170/77 168/76  Pulse: 98   Temp: 97.8 F (36.6 C)   Resp: 16   Height: 5\' 4"  (1.626 m)   Weight: 225 lb (102.059 kg)   SpO2: 100%

## 2014-12-10 NOTE — Progress Notes (Signed)
Laser Ablation Procedure    Date: 12/10/2014   [name] DOB:10/09/1930  Consent signed: Yes    Surgeon:  Dr. Nelda Severe. Kellie Simmering  Procedure: Laser Ablation: left Greater Saphenous Vein  BP 168/76 mmHg  Pulse 98  Temp(Src) 97.8 F (36.6 C)  Resp 16  Ht 5\' 4"  (1.626 m)  Wt 225 lb (102.059 kg)  BMI 38.60 kg/m2  SpO2 100%  Tumescent Anesthesia: 425 cc 0.9% NaCl with 50 cc Lidocaine HCL with 1% Epi and 15 cc 8.4% NaHCO3  Local Anesthesia: 4 cc Lidocaine HCL and NaHCO3 (ratio 2:1)  Pulsed Mode: 15 watts, 510ms delay, 1.0 duration  Total Energy: 2069             Total Pulses:  139              Total Time: 2:18    Patient tolerated procedure well  Notes:   Description of Procedure:  After marking the course of the secondary varicosities, the patient was placed on the operating table in the supine position, and the left leg was prepped and draped in sterile fashion.   Local anesthetic was administered and under ultrasound guidance the saphenous vein was accessed with a micro needle and guide wire; then the mirco puncture sheath was placed.  A guide wire was inserted saphenofemoral junction , followed by a 5 french sheath.  The position of the sheath and then the laser fiber below the junction was confirmed using the ultrasound.  Tumescent anesthesia was administered along the course of the saphenous vein using ultrasound guidance. The patient was placed in Trendelenburg position and protective laser glasses were placed on patient and staff, and the laser was fired at 15 watts continuous mode advancing 1-73mm/second for a total of 2069 joules.     Steri strips were applied to the stab wounds and ABD pads and thigh high compression stockings were applied.  Ace wrap bandages were applied over the phlebectomy sites and at the top of the saphenofemoral junction. Blood loss was less than 15 cc.  The patient ambulated out of the operating room having tolerated the procedure well.

## 2014-12-10 NOTE — Progress Notes (Signed)
Subjective:     Patient ID: [name], female   DOB: 10/09/1930, 81 y.o.   MRN: [id number]  HPI this 81 year old female had laser ablation left great saphenous vein from the proximal calf to near the saphenofemoral junction performed under local tumescent anesthesia for gross reflux with pain and swelling. She tolerated the procedure well. A total of 2069 J of energy was utilized. Review of Systems     Objective:   Physical Exam BP 168/76 mmHg  Pulse 98  Temp(Src) 97.8 F (36.6 C)  Resp 16  Ht 5\' 4"  (1.626 m)  Wt 225 lb (102.059 kg)  BMI 38.60 kg/m2  SpO2 100%       Assessment:     Well tolerated laser ablation left great saphenous vein performed under local tumescent anesthesia for gross reflux with pain and swelling left leg    Plan:     Return in 1 week for venous duplex exam confirmed closure left great saphenous vein She will then return in 3 months to see if stab phlebectomy or sclerotherapy will be indicated

## 2014-12-11 ENCOUNTER — Telehealth: Payer: Self-pay | Admitting: *Deleted

## 2014-12-11 NOTE — Telephone Encounter (Signed)
Pt doing well. Having no discomfort. Following all post-op instructions.

## 2014-12-12 ENCOUNTER — Encounter: Payer: Self-pay | Admitting: Vascular Surgery

## 2014-12-14 ENCOUNTER — Encounter: Payer: Self-pay | Admitting: Vascular Surgery

## 2014-12-17 ENCOUNTER — Ambulatory Visit (INDEPENDENT_AMBULATORY_CARE_PROVIDER_SITE_OTHER): Payer: Medicare Other | Admitting: Vascular Surgery

## 2014-12-17 ENCOUNTER — Ambulatory Visit (HOSPITAL_COMMUNITY)
Admission: RE | Admit: 2014-12-17 | Discharge: 2014-12-17 | Disposition: A | Discharge status: Home / Self Care | Payer: Medicare Other | Source: Ambulatory Visit | Attending: Vascular Surgery | Admitting: Vascular Surgery

## 2014-12-17 ENCOUNTER — Encounter: Payer: Self-pay | Admitting: Vascular Surgery

## 2014-12-17 VITALS — BP 140/80 | HR 87 | Temp 97.8°F | Resp 16 | Ht 64.0 in | Wt 230.0 lb

## 2014-12-17 DIAGNOSIS — I83892 Varicose veins of left lower extremities with other complications: Secondary | ICD-10-CM | POA: Diagnosis not present

## 2014-12-17 DIAGNOSIS — I83812 Varicose veins of left lower extremities with pain: Secondary | ICD-10-CM | POA: Insufficient documentation

## 2014-12-17 NOTE — Progress Notes (Signed)
Subjective:     Patient ID: [name], female   DOB: 10/09/1930, 81 y.o.   MRN: [id number]  HPI this 81 year old female returns 1 week post-laser ablation left great saphenous vein for painful varicosities in the left leg due to gross reflux. She has worn her elastic compression stocking and taken ibuprofen as instructed. She says the discomfort is improving. She has had some difficulty putting the long stockings on but is getting help from her daughter. She has had no distal edema.  Past Medical History  Diagnosis Date  . Hypertension   . Diabetes mellitus   . Hyperlipidemia   . COPD (chronic obstructive pulmonary disease)   . Glaucoma     Social History  Substance Use Topics  . Smoking status: Never Smoker   . Smokeless tobacco: Not on file  . Alcohol Use: Not on file    Family History  Problem Relation Age of Onset  . Stroke Mother     No Known Allergies   Current outpatient prescriptions:  .  atorvastatin (LIPITOR) 10 MG tablet, Take 10 mg by mouth daily., Disp: , Rfl:  .  glipiZIDE (GLUCOTROL) 5 MG tablet, Take 5 mg by mouth daily. , Disp: , Rfl:  .  glucose blood test strip, 1 each by Other route as needed for other. Use as instructed, Disp: , Rfl:  .  hydrochlorothiazide (HYDRODIURIL) 25 MG tablet, Take 25 mg by mouth daily., Disp: , Rfl:  .  ibuprofen (ADVIL,MOTRIN) 200 MG tablet, Take 200 mg by mouth every 6 (six) hours as needed for mild pain., Disp: , Rfl:  .  latanoprost (XALATAN) 0.005 % ophthalmic solution, Place 1 drop into both eyes at bedtime. , Disp: , Rfl:  .  Liraglutide (VICTOZA [place]), Inject 1 application into the skin daily., Disp: , Rfl:  .  metFORMIN (GLUCOPHAGE) 1000 MG tablet, Take 1,000 mg by mouth daily. , Disp: , Rfl:  .  Multiple Vitamins-Minerals (MULTIVITAMIN WITH MINERALS) tablet, Take 1 tablet by mouth daily., Disp: , Rfl:  .  pioglitazone (ACTOS) 30 MG tablet, Take 15 mg by mouth daily., Disp: , Rfl:  .  telmisartan (MICARDIS) 80 MG  tablet, Take 80 mg by mouth daily., Disp: , Rfl:   Filed Vitals:   12/17/14 1032 12/17/14 1033  BP: 149/82 140/80  Pulse: 87   Temp: 97.8 F (36.6 C)   Resp: 16   Height: 5\' 4"  (1.626 m)   Weight: 230 lb (104.327 kg)   SpO2: 100%     Body mass index is 39.46 kg/(m^2).           Review of Systems denies chest pain, dyspnea on exertion, PND, orthopnea, hemoptysis, claudication.     Objective:   Physical Exam BP 140/80 mmHg  Pulse 87  Temp(Src) 97.8 F (36.6 C)  Resp 16  Ht 5\' 4"  (1.626 m)  Wt 230 lb (104.327 kg)  BMI 39.46 kg/m2  SpO2 100%  Gen. well-developed well-nourished female in no apparent distress alert and oriented 3 Lungs no rhonchi or wheezing Cardiovascular regular rhythm no murmurs Left leg with mild discomfort to palpation of great saphenous vein from the knee to the proximal thigh area. Extensive reticular veins below the knee over the great saphenous system down to the medial malleolar area. No ulceration noted. 3+ dorsalis pedis pulse palpable.  Today I ordered a venous duplex exam the left leg which I reviewed and interpreted. There is no DVT. There is total closure of the left great  saphenous vein from near the saphenofemoral junction to the proximal calf     Assessment:     Successful laser ablation left great saphenous vein painful varicosities due to gross reflux    Plan:     Return in 3 months for continued follow-up to see if sclerotherapy of these painful reticular veins will be indicated

## 2014-12-17 NOTE — Progress Notes (Signed)
Filed Vitals:   12/17/14 1032 12/17/14 1033  BP: 149/82 140/80  Pulse: 87   Temp: 97.8 F (36.6 C)   Resp: 16   Height: 5\' 4"  (1.626 m)   Weight: 230 lb (104.327 kg)   SpO2: 100%

## 2015-02-19 DIAGNOSIS — D649 Anemia, unspecified: Secondary | ICD-10-CM | POA: Diagnosis not present

## 2015-02-19 DIAGNOSIS — E119 Type 2 diabetes mellitus without complications: Secondary | ICD-10-CM | POA: Diagnosis not present

## 2015-02-19 DIAGNOSIS — I10 Essential (primary) hypertension: Secondary | ICD-10-CM | POA: Diagnosis not present

## 2015-02-22 DIAGNOSIS — E7800 Pure hypercholesterolemia, unspecified: Secondary | ICD-10-CM | POA: Diagnosis not present

## 2015-02-22 DIAGNOSIS — I10 Essential (primary) hypertension: Secondary | ICD-10-CM | POA: Diagnosis not present

## 2015-02-22 DIAGNOSIS — E119 Type 2 diabetes mellitus without complications: Secondary | ICD-10-CM | POA: Diagnosis not present

## 2015-03-11 DIAGNOSIS — M216X1 Other acquired deformities of right foot: Secondary | ICD-10-CM | POA: Diagnosis not present

## 2015-03-11 DIAGNOSIS — L84 Corns and callosities: Secondary | ICD-10-CM | POA: Diagnosis not present

## 2015-03-11 DIAGNOSIS — E119 Type 2 diabetes mellitus without complications: Secondary | ICD-10-CM | POA: Diagnosis not present

## 2015-03-11 DIAGNOSIS — M722 Plantar fascial fibromatosis: Secondary | ICD-10-CM | POA: Diagnosis not present

## 2015-03-11 DIAGNOSIS — M216X2 Other acquired deformities of left foot: Secondary | ICD-10-CM | POA: Diagnosis not present

## 2015-03-11 DIAGNOSIS — L602 Onychogryphosis: Secondary | ICD-10-CM | POA: Diagnosis not present

## 2015-03-12 DIAGNOSIS — E119 Type 2 diabetes mellitus without complications: Secondary | ICD-10-CM | POA: Diagnosis not present

## 2015-03-12 DIAGNOSIS — H401111 Primary open-angle glaucoma, right eye, mild stage: Secondary | ICD-10-CM | POA: Diagnosis not present

## 2015-03-12 DIAGNOSIS — H401122 Primary open-angle glaucoma, left eye, moderate stage: Secondary | ICD-10-CM | POA: Diagnosis not present

## 2015-03-19 ENCOUNTER — Encounter: Payer: Self-pay | Admitting: Vascular Surgery

## 2015-03-19 ENCOUNTER — Ambulatory Visit: Payer: Medicare Other | Admitting: Vascular Surgery

## 2015-03-25 ENCOUNTER — Encounter: Payer: Self-pay | Admitting: *Deleted

## 2015-03-25 DIAGNOSIS — L602 Onychogryphosis: Secondary | ICD-10-CM | POA: Diagnosis not present

## 2015-03-25 DIAGNOSIS — E1351 Other specified diabetes mellitus with diabetic peripheral angiopathy without gangrene: Secondary | ICD-10-CM | POA: Diagnosis not present

## 2015-03-25 DIAGNOSIS — M722 Plantar fascial fibromatosis: Secondary | ICD-10-CM | POA: Diagnosis not present

## 2015-03-25 DIAGNOSIS — I70293 Other atherosclerosis of native arteries of extremities, bilateral legs: Secondary | ICD-10-CM | POA: Diagnosis not present

## 2015-03-26 ENCOUNTER — Encounter: Payer: Self-pay | Admitting: Vascular Surgery

## 2015-03-26 ENCOUNTER — Ambulatory Visit (INDEPENDENT_AMBULATORY_CARE_PROVIDER_SITE_OTHER): Payer: Medicare Other | Admitting: Vascular Surgery

## 2015-03-26 VITALS — BP 139/58 | HR 87 | Temp 98.0°F | Resp 16 | Ht 64.0 in | Wt 233.0 lb

## 2015-03-26 DIAGNOSIS — I83892 Varicose veins of left lower extremities with other complications: Secondary | ICD-10-CM

## 2015-03-26 NOTE — Progress Notes (Signed)
Subjective:     Patient ID: [name], female   DOB: 10/09/1930, 81 y.o.   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 She underwent laser ablation left great saphenous vein about 3 months ago. She continues to have some burning and stinging discomfort over the small varicosities in the medial calf area. She states the leg in general feels better than it did prior to the procedure and with less swelling. She has no history of DVT thrombophlebitis stasis ulcers or bleeding.  Past Medical History  Diagnosis Date  . Hypertension   . Diabetes mellitus   . Hyperlipidemia   . COPD (chronic obstructive pulmonary disease) ([place])   . Glaucoma     Social History  Substance Use Topics  . Smoking status: Never Smoker   . Smokeless tobacco: Not on file  . Alcohol Use: Not on file    Family History  Problem Relation Age of Onset  . Stroke Mother     No Known Allergies   Current outpatient prescriptions:  .  atorvastatin (LIPITOR) 10 MG tablet, Take 10 mg by mouth daily., Disp: , Rfl:  .  glipiZIDE (GLUCOTROL) 5 MG tablet, Take 5 mg by mouth daily. , Disp: , Rfl:  .  glucose blood test strip, 1 each by Other route as needed for other. Use as instructed, Disp: , Rfl:  .  hydrochlorothiazide (HYDRODIURIL) 25 MG tablet, Take 25 mg by mouth daily., Disp: , Rfl:  .  ibuprofen (ADVIL,MOTRIN) 200 MG tablet, Take 200 mg by mouth every 6 (six) hours as needed for mild pain., Disp: , Rfl:  .  latanoprost (XALATAN) 0.005 % ophthalmic solution, Place 1 drop into both eyes at bedtime. , Disp: , Rfl:  .  Liraglutide (VICTOZA Hanson), Inject 1 application into the skin daily., Disp: , Rfl:  .  metFORMIN (GLUCOPHAGE) 1000 MG tablet, Take 1,000 mg by mouth daily. , Disp: , Rfl:  .  Multiple Vitamins-Minerals (MULTIVITAMIN WITH MINERALS) tablet, Take 1 tablet by mouth daily., Disp: , Rfl:  .  pioglitazone (ACTOS) 30 MG tablet,  Take 15 mg by mouth daily., Disp: , Rfl:  .  telmisartan (MICARDIS) 80 MG tablet, Take 80 mg by mouth daily., Disp: , Rfl:   Filed Vitals:   03/26/15 0933  BP: 139/58  Pulse: 87  Temp: 98 F (36.7 C)  Resp: 16  Height: 5\' 4"  (1.626 m)  Weight: 233 lb (105.688 kg)  SpO2: 99%    Body mass index is 39.97 kg/(m^2).           Review of Systems Denies chest pain, hemoptysis, PND , orthopnea.     Objective:   Physical Exam BP 139/58 mmHg  Pulse 87  Temp(Src) 98 F (36.7 C)  Resp 16  Ht 5\' 4"  (1.626 m)  Wt 233 lb (105.688 kg)  BMI 39.97 kg/m2  SpO2 99%   general well-developed elderly female no apparent distress alert and oriented 3 Lungs no rhonchi or wheezing Cardiovascular regular rhythm no murmurs Left leg with no distal edema noted. Prominent reticular veins beginning at knee extending in the pretibial region which are quite superficial and where the patient experiences burning and stinging discomfort. No ulceration noted. No large bulging varicosities noted.       Assessment:      good result following laser ablation left great saphenous vein with residual painful varicosities     Plan:  patient needs one course of sclerotherapy for these painful residual varicosities and this will complete her treatment regimen

## 2015-03-27 ENCOUNTER — Ambulatory Visit (INDEPENDENT_AMBULATORY_CARE_PROVIDER_SITE_OTHER): Payer: Medicare Other | Admitting: *Deleted

## 2015-03-27 DIAGNOSIS — I83892 Varicose veins of left lower extremities with other complications: Secondary | ICD-10-CM

## 2015-03-27 DIAGNOSIS — I83893 Varicose veins of bilateral lower extremities with other complications: Secondary | ICD-10-CM

## 2015-03-27 NOTE — Progress Notes (Signed)
X=.3% Sotradecol administered with a 27g butterfly.  Patient received a total of 24cc.  Treated all her spider veins. Had some left over so treated the right leg too. Easy access. Tol well. Follow prn.   Photos: No.  Compression stockings applied: Yes.

## 2015-04-01 ENCOUNTER — Encounter: Payer: Self-pay | Admitting: Vascular Surgery

## 2015-04-14 DIAGNOSIS — T25121A Burn of first degree of right foot, initial encounter: Secondary | ICD-10-CM | POA: Diagnosis not present

## 2015-05-08 DIAGNOSIS — M109 Gout, unspecified: Secondary | ICD-10-CM | POA: Diagnosis not present

## 2015-05-08 DIAGNOSIS — E119 Type 2 diabetes mellitus without complications: Secondary | ICD-10-CM | POA: Diagnosis not present

## 2015-05-20 DIAGNOSIS — R0789 Other chest pain: Secondary | ICD-10-CM | POA: Diagnosis not present

## 2015-05-20 DIAGNOSIS — R1010 Upper abdominal pain, unspecified: Secondary | ICD-10-CM | POA: Diagnosis not present

## 2015-05-20 DIAGNOSIS — E662 Morbid (severe) obesity with alveolar hypoventilation: Secondary | ICD-10-CM | POA: Diagnosis not present

## 2015-05-20 DIAGNOSIS — I10 Essential (primary) hypertension: Secondary | ICD-10-CM | POA: Diagnosis not present

## 2015-05-20 DIAGNOSIS — R079 Chest pain, unspecified: Secondary | ICD-10-CM | POA: Diagnosis not present

## 2015-05-22 DIAGNOSIS — R110 Nausea: Secondary | ICD-10-CM | POA: Diagnosis not present

## 2015-05-22 DIAGNOSIS — I10 Essential (primary) hypertension: Secondary | ICD-10-CM | POA: Diagnosis not present

## 2015-05-22 DIAGNOSIS — Z79899 Other long term (current) drug therapy: Secondary | ICD-10-CM | POA: Diagnosis not present

## 2015-05-22 DIAGNOSIS — K219 Gastro-esophageal reflux disease without esophagitis: Secondary | ICD-10-CM | POA: Diagnosis not present

## 2015-05-22 DIAGNOSIS — R531 Weakness: Secondary | ICD-10-CM | POA: Diagnosis not present

## 2015-05-22 DIAGNOSIS — E1165 Type 2 diabetes mellitus with hyperglycemia: Secondary | ICD-10-CM | POA: Diagnosis not present

## 2015-05-22 DIAGNOSIS — R351 Nocturia: Secondary | ICD-10-CM | POA: Diagnosis not present

## 2015-05-22 DIAGNOSIS — M109 Gout, unspecified: Secondary | ICD-10-CM | POA: Diagnosis not present

## 2015-05-22 DIAGNOSIS — R350 Frequency of micturition: Secondary | ICD-10-CM | POA: Diagnosis not present

## 2015-05-31 DIAGNOSIS — R0789 Other chest pain: Secondary | ICD-10-CM | POA: Diagnosis not present

## 2015-06-07 DIAGNOSIS — E662 Morbid (severe) obesity with alveolar hypoventilation: Secondary | ICD-10-CM | POA: Diagnosis not present

## 2015-06-07 DIAGNOSIS — I10 Essential (primary) hypertension: Secondary | ICD-10-CM | POA: Diagnosis not present

## 2015-06-07 DIAGNOSIS — R0789 Other chest pain: Secondary | ICD-10-CM | POA: Diagnosis not present

## 2015-06-07 DIAGNOSIS — R1010 Upper abdominal pain, unspecified: Secondary | ICD-10-CM | POA: Diagnosis not present

## 2015-06-19 DIAGNOSIS — M109 Gout, unspecified: Secondary | ICD-10-CM | POA: Diagnosis not present

## 2015-06-19 DIAGNOSIS — R32 Unspecified urinary incontinence: Secondary | ICD-10-CM | POA: Diagnosis not present

## 2015-06-19 DIAGNOSIS — I10 Essential (primary) hypertension: Secondary | ICD-10-CM | POA: Diagnosis not present

## 2015-06-21 DIAGNOSIS — I10 Essential (primary) hypertension: Secondary | ICD-10-CM | POA: Diagnosis not present

## 2015-06-21 DIAGNOSIS — E119 Type 2 diabetes mellitus without complications: Secondary | ICD-10-CM | POA: Diagnosis not present

## 2015-06-21 DIAGNOSIS — E7800 Pure hypercholesterolemia, unspecified: Secondary | ICD-10-CM | POA: Diagnosis not present

## 2015-06-21 DIAGNOSIS — R32 Unspecified urinary incontinence: Secondary | ICD-10-CM | POA: Diagnosis not present

## 2015-07-08 DIAGNOSIS — L570 Actinic keratosis: Secondary | ICD-10-CM | POA: Diagnosis not present

## 2015-07-08 DIAGNOSIS — D0471 Carcinoma in situ of skin of right lower limb, including hip: Secondary | ICD-10-CM | POA: Diagnosis not present

## 2015-07-08 DIAGNOSIS — L820 Inflamed seborrheic keratosis: Secondary | ICD-10-CM | POA: Diagnosis not present

## 2015-07-08 DIAGNOSIS — C44519 Basal cell carcinoma of skin of other part of trunk: Secondary | ICD-10-CM | POA: Diagnosis not present

## 2015-07-08 DIAGNOSIS — X32XXXD Exposure to sunlight, subsequent encounter: Secondary | ICD-10-CM | POA: Diagnosis not present

## 2015-07-09 DIAGNOSIS — I10 Essential (primary) hypertension: Secondary | ICD-10-CM | POA: Diagnosis not present

## 2015-07-09 DIAGNOSIS — E119 Type 2 diabetes mellitus without complications: Secondary | ICD-10-CM | POA: Diagnosis not present

## 2015-07-09 DIAGNOSIS — E7800 Pure hypercholesterolemia, unspecified: Secondary | ICD-10-CM | POA: Diagnosis not present

## 2015-07-10 DIAGNOSIS — H401122 Primary open-angle glaucoma, left eye, moderate stage: Secondary | ICD-10-CM | POA: Diagnosis not present

## 2015-07-10 DIAGNOSIS — H401111 Primary open-angle glaucoma, right eye, mild stage: Secondary | ICD-10-CM | POA: Diagnosis not present

## 2015-07-17 DIAGNOSIS — H401131 Primary open-angle glaucoma, bilateral, mild stage: Secondary | ICD-10-CM | POA: Diagnosis not present

## 2015-08-19 DIAGNOSIS — R0609 Other forms of dyspnea: Secondary | ICD-10-CM | POA: Diagnosis not present

## 2015-08-19 DIAGNOSIS — E119 Type 2 diabetes mellitus without complications: Secondary | ICD-10-CM | POA: Diagnosis not present

## 2015-08-19 DIAGNOSIS — I10 Essential (primary) hypertension: Secondary | ICD-10-CM | POA: Diagnosis not present

## 2015-08-27 ENCOUNTER — Other Ambulatory Visit (HOSPITAL_COMMUNITY): Payer: Self-pay | Admitting: Respiratory Therapy

## 2015-08-27 DIAGNOSIS — R0600 Dyspnea, unspecified: Secondary | ICD-10-CM

## 2015-08-27 DIAGNOSIS — R0609 Other forms of dyspnea: Principal | ICD-10-CM

## 2015-09-06 DIAGNOSIS — Z08 Encounter for follow-up examination after completed treatment for malignant neoplasm: Secondary | ICD-10-CM | POA: Diagnosis not present

## 2015-09-06 DIAGNOSIS — Z85828 Personal history of other malignant neoplasm of skin: Secondary | ICD-10-CM | POA: Diagnosis not present

## 2015-09-09 ENCOUNTER — Encounter (HOSPITAL_COMMUNITY): Payer: Medicare Other

## 2015-09-16 ENCOUNTER — Ambulatory Visit (HOSPITAL_COMMUNITY)
Admission: RE | Admit: 2015-09-16 | Discharge: 2015-09-16 | Disposition: A | Discharge status: Home / Self Care | Payer: Medicare Other | Source: Ambulatory Visit | Attending: Internal Medicine | Admitting: Internal Medicine

## 2015-09-16 DIAGNOSIS — R0609 Other forms of dyspnea: Secondary | ICD-10-CM | POA: Insufficient documentation

## 2015-09-16 LAB — PULMONARY FUNCTION TEST
DL/VA % pred: 79 %
DL/VA: 3.82 ml/min/mmHg/L
DLCO unc % pred: 37 %
DLCO unc: 9.13 ml/min/mmHg
FEF 25-75 Post: 1.69 L/sec
FEF 25-75 Pre: 1.13 L/sec
FEF2575-%CHANGE-POST: 48 %
FEF2575-%PRED-PRE: 89 %
FEF2575-%Pred-Post: 133 %
FEV1-%Change-Post: 14 %
FEV1-%PRED-POST: 79 %
FEV1-%Pred-Pre: 69 %
FEV1-PRE: 1.28 L
FEV1-Post: 1.46 L
FEV1FVC-%CHANGE-POST: 5 %
FEV1FVC-%Pred-Pre: 104 %
FEV6-%CHANGE-POST: 5 %
FEV6-%PRED-POST: 74 %
FEV6-%PRED-PRE: 70 %
FEV6-POST: 1.75 L
FEV6-Pre: 1.66 L
FEV6FVC-%Change-Post: 0 %
FEV6FVC-%PRED-PRE: 106 %
FEV6FVC-%Pred-Post: 106 %
FVC-%CHANGE-POST: 7 %
FVC-%PRED-PRE: 67 %
FVC-%Pred-Post: 72 %
FVC-POST: 1.81 L
FVC-Pre: 1.67 L
POST FEV6/FVC RATIO: 100 %
PRE FEV1/FVC RATIO: 76 %
PRE FEV6/FVC RATIO: 100 %
Post FEV1/FVC ratio: 81 %
RV % pred: 71 %
RV: 1.75 L
TLC % pred: 68 %
TLC: 3.47 L

## 2015-09-16 MED ORDER — ALBUTEROL SULFATE (2.5 MG/3ML) 0.083% IN NEBU
2.5000 mg | INHALATION_SOLUTION | Freq: Once | RESPIRATORY_TRACT | Status: AC
  Administered 2015-09-16: 2.5 mg via RESPIRATORY_TRACT

## 2015-09-24 DIAGNOSIS — E119 Type 2 diabetes mellitus without complications: Secondary | ICD-10-CM | POA: Diagnosis not present

## 2015-09-24 DIAGNOSIS — R0609 Other forms of dyspnea: Secondary | ICD-10-CM | POA: Diagnosis not present

## 2015-09-24 DIAGNOSIS — E1165 Type 2 diabetes mellitus with hyperglycemia: Secondary | ICD-10-CM | POA: Diagnosis not present

## 2015-09-24 DIAGNOSIS — I10 Essential (primary) hypertension: Secondary | ICD-10-CM | POA: Diagnosis not present

## 2015-10-23 DIAGNOSIS — I10 Essential (primary) hypertension: Secondary | ICD-10-CM | POA: Diagnosis not present

## 2015-10-23 DIAGNOSIS — R0609 Other forms of dyspnea: Secondary | ICD-10-CM | POA: Diagnosis not present

## 2015-10-23 DIAGNOSIS — R0600 Dyspnea, unspecified: Secondary | ICD-10-CM | POA: Diagnosis not present

## 2015-10-23 DIAGNOSIS — E119 Type 2 diabetes mellitus without complications: Secondary | ICD-10-CM | POA: Diagnosis not present

## 2015-10-30 DIAGNOSIS — L84 Corns and callosities: Secondary | ICD-10-CM | POA: Diagnosis not present

## 2015-10-30 DIAGNOSIS — X32XXXD Exposure to sunlight, subsequent encounter: Secondary | ICD-10-CM | POA: Diagnosis not present

## 2015-10-30 DIAGNOSIS — L570 Actinic keratosis: Secondary | ICD-10-CM | POA: Diagnosis not present

## 2015-11-13 DIAGNOSIS — I10 Essential (primary) hypertension: Secondary | ICD-10-CM | POA: Diagnosis not present

## 2015-11-13 DIAGNOSIS — E662 Morbid (severe) obesity with alveolar hypoventilation: Secondary | ICD-10-CM | POA: Diagnosis not present

## 2015-11-13 DIAGNOSIS — R0602 Shortness of breath: Secondary | ICD-10-CM | POA: Diagnosis not present

## 2015-11-13 DIAGNOSIS — E119 Type 2 diabetes mellitus without complications: Secondary | ICD-10-CM | POA: Diagnosis not present

## 2015-11-26 DIAGNOSIS — I10 Essential (primary) hypertension: Secondary | ICD-10-CM | POA: Diagnosis not present

## 2015-11-26 DIAGNOSIS — R0602 Shortness of breath: Secondary | ICD-10-CM | POA: Diagnosis not present

## 2015-11-29 DIAGNOSIS — I272 Other secondary pulmonary hypertension: Secondary | ICD-10-CM | POA: Diagnosis not present

## 2015-11-29 DIAGNOSIS — I10 Essential (primary) hypertension: Secondary | ICD-10-CM | POA: Diagnosis not present

## 2015-11-29 DIAGNOSIS — E662 Morbid (severe) obesity with alveolar hypoventilation: Secondary | ICD-10-CM | POA: Diagnosis not present

## 2015-11-29 DIAGNOSIS — R0602 Shortness of breath: Secondary | ICD-10-CM | POA: Diagnosis not present

## 2015-12-03 DIAGNOSIS — R0602 Shortness of breath: Secondary | ICD-10-CM | POA: Diagnosis not present

## 2015-12-06 DIAGNOSIS — D696 Thrombocytopenia, unspecified: Secondary | ICD-10-CM | POA: Diagnosis not present

## 2015-12-07 DIAGNOSIS — I272 Pulmonary hypertension, unspecified: Secondary | ICD-10-CM | POA: Diagnosis present

## 2015-12-07 NOTE — H&P (Signed)
OFFICE VISIT NOTES COPIED TO EPIC FOR DOCUMENTATION  . History of Present Illness Laverda Page MD; 12/20/2015 5:59 PM) Patient words: FU echo resutls; Last OV 11/13/2015.  The patient is a 82 year old female who presents for a follow-up for Shortness of breath. Patient was evaluated by her PCP on 10/23/2015 and was concerned about continued dyspnea on exertion in spite of PFTs being normal and did not reveal any significant COPD. Hence is referred back to me for evaluation of the same. Patient has had a nuclear stress test to evaluate her dyspnea on 05/24/2015 had revealed no evidence of ischemia with normal LVEF. However due to persistence of symptoms of dyspnea, I performed an echocardiogram and she now presents to discuss findings of the same. No change in symptoms.  She has a history of diabetes mellitus, hyperlipidemia, morbid obesity and known history of sleep apnea, but unable to tolerate CPAP, therefore she was placed on supplemental oxygen therapy at night. She has now been on home oxygen since 12/20/2012.     Problem List/Past Medical Anderson Malta Sergeant; 12/20/2015 11:59 AM) Shortness of breath (R06.02)  Lexiscan Myoview stress test on 05/24/2015: No evidence of ischemia with normal LV systolic function. Controlled type 2 diabetes mellitus without complication, with long-term current use of insulin (E11.9)  Hyperlipidemia, mild (E78.5)  Sleep apnea, obstructive (G47.33)  Unable to tolerate CPAP. Tolerating Nocturnal O2 therapy. Nocturnal oximetry 01/24/2013: Longest continuous time with saturations less than 88% was 22 minutes. There were 253 desaturation events of less than 3 minutes, highest SPO2 was 95% and mean low was 87%. This represents significant improvement with nocturnal oxygen supplementation compared to study done 12/12/2012. Total desaturation events previously was 504 and now reduced to 253. 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  Family History Anderson Malta Sergeant; 12-20-15 11:59 AM) Mother  Deceased. at age 103 from unknown death (had a stroke at age 15) Father  Deceased. at age 21 from unknown death (no heart condition) Siblings  4 siblings (One brother had CABG at age 44)  Social History Anderson Malta Sergeant; 12/20/15 11:59 AM) Current tobacco use  Never smoker. Non Drinker/No Alcohol Use  Marital status  Married. Number of Children  5.  Past Surgical History Anderson Malta Sergeant; 20-Dec-2015 11:59 AM) Cholecystectomy 2000 Hernia Repair 2006 [facility]  Medication History Anderson Malta Sergeant; 2015/12/20 12:03 PM) Aspirin Low Dose (81MG Tablet, 1 (one) Tablet Tablet Tablet T Oral daily, Taken starting 12/07/2012) Active. Actos (30MG Tablet, 1/2 tab Oral daily) Active. MetFORMIN HCl (1000MG Tablet, 1 Oral daily) Active. GlipiZIDE (5MG Tablet, 1 Oral daily) Active. Micardis (80MG Tablet, 1 Oral daily) Active. Hydrochlorothiazide (25MG Tablet, 1 Oral daily) Active. Victoza (18MG/3ML Soln Pen-inj, .006 ml Subcutaneous daily) Active. Xalatan (0.005% Solution, 1 drop each eye Ophthalmic at bedtime) Active. Medications Reconciled (Verbally with patient.)  Diagnostic Studies History Anderson Malta Sergeant; 12/20/15 11:59 AM) Echocardiogram 11/26/2015 1. Left ventricle cavity is normal in size. Mild concentric hypertrophy of the left ventricle. Normal global wall motion. Doppler evidence of grade II (pseudonormal) diastolic dysfunction. Diastolic dysfunction findings suggests elevated LA/LV end diastolic pressure. Calculated EF 62%. 2. Left atrial cavity is mildly dilated at 4.1 cm. 3. Right atrial cavity is moderately dilated. 4. Right ventricle cavity is moderately dilated. Normal right ventricular function. 5. Mild aortic valve leaflet  calcification. No aortic valve regurgitation noted. Mild  aortic valve stenosis. Aortic valve peak pressure gradient of 21 and mean gradient of 11 mmHg, calculated aortic valve area 1.26 cm. 6. Moderate tricuspid regurgitation. Moderate pulmonary hypertension. Pulmonary artery systolic pressure is estimated at 56 mm Hg. 7. Compared to 01/02/2013, right heart findings new. Mild AS new. Labwork  Labs 11/11/2012: HbA1c 7.2%. CMP revealed BUN of 22, serum creatinine 1.1 with eGFR 50.82 mL. Lipid profile 08/09/12 Total cholesterol 102, triglycerides 103, HDL 42, LDL 39. CBC was within normal limits. Sleep Study 2008 Colonoscopy 2012 Nuclear stress test 05/24/2015 1. The resting electrocardiogram demonstrated normal sinus rhythm, normal resting conduction and no resting arrhythmias. Stress EKG is non-diagnostic for ischemia as it a pharmacologic stress using Lexiscan. Stress symptoms included dyspnea. 2. Myocardial perfusion imaging is normal. Overall left ventricular systolic function was normal without regional wall motion abnormalities. The left ventricular ejection fraction was 70%. No significant change from 12/19/12.  Other Problems Anderson Malta Sergeant; 11/29/2015 11:59 AM) Abnormal nuclear stress test (R94.39)  Lexiscan Myoview stress test 12/19/2012: 1. Resting EKG NSR, Poor R wave progression. Inferior infarct, anterior infarct old, pulmonary disease pattern. Stress EKG was non diagnostic for ischemia. No ST-T changes of ischemia noted with pharmacologic stress testing. Stress symptoms included lightheadedness. Stress terminated due to completion of protocol. 2. The perfusion study demonstrated normal isotope uptake both at rest and stress. There was no evidence of ischemia or scar. Dynamic gated images reveal normal wall motion and endocardial thickening. Left ventricular ejection fraction was estimated to be 74%. Echocardiogram findings abnormal, without diagnosis (R93.1) 01/02/2013 echo- 01/02/13 1.Left  ventricular cavity is normal in size. Normal global wall motion. Normal systolic global function. Calculated EF 55%. Visual EF is 60-65%. Doppler evidence of Grade I (impaired) diastolic dysfunction. 2.Mild calcification of the mitral annulus. Trace mitral regurgitation. Mitral valve inflow E > A ratio. 3.Tricuspid valve structurally normal. Trace tricuspid regurgitation.    Review of Systems Laverda Page, MD; 11/29/2015 6:1 PM) General Not Present- Anorexia, Fatigue and Fever. Respiratory Present- Difficulty Breathing on Exertion. Not Present- Cough, Wakes up from Sleep Wheezing or Short of Breath and Wheezing. Cardiovascular Not Present- Chest Pain, Claudications, Edema, Orthopnea, Palpitations and Paroxysmal Nocturnal Dyspnea. Gastrointestinal Not Present- Black, Tarry Stool, Change in Bowel Habits and Nausea. Neurological Not Present- Focal Neurological Symptoms and Syncope. Endocrine Not Present- Cold Intolerance, Excessive Sweating, Heat Intolerance and Thyroid Problems. Hematology Not Present- Anemia, Easy Bruising, Petechiae and Prolonged Bleeding.  Vitals Anderson Malta Sergeant; 11/29/2015 12:05 PM) 11/29/2015 11:59 AM Weight: 230.06 lb Height: 64in Body Surface Area: 2.08 m Body Mass Index: 39.49 kg/m  Pulse: 85 (Regular)  P.OX: 93% (Room air) BP: 130/60 (Sitting, Left Arm, Standard)       Physical Exam Laverda Page, MD; 11/29/2015 6:1 PM) General Mental Status-Alert. General Appearance-Cooperative, Appears stated age, Not in acute distress. Build & Nutrition-Moderately built and Morbidly obese.  Head and Neck Neck -Note: Short neck and difficult to evaluate JVD.  Thyroid Gland Characteristics - no palpable nodules, no palpable enlargement.  Chest and Lung Exam Chest and lung exam reveals -quiet, even and easy respiratory effort with no use of accessory muscles, non-tender and on auscultation, normal breath sounds, no adventitious  sounds.  Cardiovascular Inspection Jugular vein - Right - No Distention. Auscultation Heart Sounds - S1 WNL and S2 WNL. Murmurs & Other Heart Sounds: Murmur - Location - Apex and Sternal Border - Right. Timing - Early systolic. Grade - II/VI.  Abdomen Inspection Contour - Obese and Pannus present. Palpation/Percussion Normal  exam - No hepatosplenomegaly. Tenderness - Epigastrium. Auscultation Normal exam - Bowel sounds normal.  Peripheral Vascular Lower Extremity Inspection - Bilateral - Inspection Normal. Palpation - Edema - Bilateral - No edema. Femoral pulse - Bilateral - Feeble(Pulsus difficult to feel due to patient's bodily habitus.), No Bruits. Popliteal pulse - Bilateral - Feeble(Pulsus difficult to feel due to patient's bodily habitus.). Dorsalis pedis pulse - Bilateral - Normal. Posterior tibial pulse - Bilateral - Normal. Carotid arteries - Bilateral-No Carotid bruit.  Neurologic Neurologic evaluation reveals -alert and oriented x 3 with no impairment of recent or remote memory. Motor-Grossly intact without any focal deficits.  Musculoskeletal Global Assessment Left Lower Extremity - normal range of motion without pain. Right Lower Extremity - normal range of motion without pain.    Assessment & Plan Laverda Page MD; 11/29/2015 6:00 PM) Pulmonary hypertension (I27.2) Story: Echocardiogram 11/26/2015: 1. Left ventricle cavity is normal in size. Mild concentric hypertrophy of the left ventricle. Normal global wall motion. Doppler evidence of grade II (pseudonormal) diastolic dysfunction. Diastolic dysfunction findings suggests elevated LA/LV end diastolic pressure. Calculated EF 62%. 2. Left atrial cavity is mildly dilated at 4.1 cm. 3. Right atrial cavity is moderately dilated. 4. Right ventricle cavity is moderately dilated. Normal right ventricular function. 5. Mild aortic valve leaflet calcification. No aortic valve regurgitation noted. Mild aortic  valve stenosis. Aortic valve peak pressure gradient of 21 and mean gradient of 11 mmHg, calculated aortic valve area 1.26 cm. 6. Moderate tricuspid regurgitation. Moderate pulmonary hypertension. Pulmonary artery systolic pressure is estimated at 56 mm Hg. 7. Compared to 01/02/2013, right heart findings new. Mild AS new. Shortness of breath (R06.02) Story: Lexiscan Myoview stress test on 05/24/2015: No evidence of ischemia with normal LV systolic function.  Benign essential hypertension (I10) Impression: EKG 11/13/2015: Sinus rhythm with first-degree AV block at the rate of 71 bpm, left axis deviation. Poor R-wave progression, cannot exclude anterior infarct old. Pulmonary disease pattern. No significant change from EKG 05/20/2015, EKG 12/12/2013. Morbid or severe obesity with alveolar hypoventilation (E66.2)  Chronic Thrombocytopenia.  Current Plans Mechanism of underlying disease process and action of medications discussed with the patient. I discussed primary/secondary prevention and also dietary counseling was done. I was surprised by the echocardiographic findings of RV dilatation and moderate pulmonary hypertension. Due to worsening symptoms of dyspnea, further evaluation for either primary or secondary causes for pulmonary hypertension it to be evaluated. I discussed proceeding with right heart catheterization including risks and benefits. Patient is willing to proceed.  Obesity hypoventilation may still be contributing to her RV strain and ulnarly hypertension. I'll make further recommendation after the catheterization. No changes in the medications were done today. Weight loss again discussed with the patient. Overall blood pressure is well controlled, diabetes is also well controlled.  CC Dr. [name].  Addendum Note(Jagadeesh Carlynn Herald MD; 12/07/2015 7:46 AM) Labs 12/06/2015: HB 11.4/HCT 34.5. Platelets 61,000. Normal indicis.  12/03/2015: glucose 120, BUN 35, creatinine 1.23, eGFR 41,  potassium 5.1, PLT <12,000, CBC otherwise normal, PT/INR normal  Signed by Laverda Page, MD (11/29/2015 6:01 PM)

## 2015-12-09 ENCOUNTER — Encounter (HOSPITAL_COMMUNITY)
Admission: RE | Disposition: A | Discharge status: Home / Self Care | Payer: Self-pay | Source: Ambulatory Visit | Attending: Cardiology

## 2015-12-09 ENCOUNTER — Encounter (HOSPITAL_COMMUNITY): Payer: Self-pay | Admitting: *Deleted

## 2015-12-09 ENCOUNTER — Ambulatory Visit (HOSPITAL_COMMUNITY)
Admission: RE | Admit: 2015-12-09 | Discharge: 2015-12-09 | Disposition: A | Discharge status: Home / Self Care | Payer: Medicare Other | Source: Ambulatory Visit | Attending: Cardiology | Admitting: Cardiology

## 2015-12-09 DIAGNOSIS — E119 Type 2 diabetes mellitus without complications: Secondary | ICD-10-CM | POA: Diagnosis not present

## 2015-12-09 DIAGNOSIS — E662 Morbid (severe) obesity with alveolar hypoventilation: Secondary | ICD-10-CM | POA: Insufficient documentation

## 2015-12-09 DIAGNOSIS — Z823 Family history of stroke: Secondary | ICD-10-CM | POA: Diagnosis not present

## 2015-12-09 DIAGNOSIS — I10 Essential (primary) hypertension: Secondary | ICD-10-CM | POA: Insufficient documentation

## 2015-12-09 DIAGNOSIS — Z7982 Long term (current) use of aspirin: Secondary | ICD-10-CM | POA: Insufficient documentation

## 2015-12-09 DIAGNOSIS — E785 Hyperlipidemia, unspecified: Secondary | ICD-10-CM | POA: Insufficient documentation

## 2015-12-09 DIAGNOSIS — I272 Other secondary pulmonary hypertension: Secondary | ICD-10-CM | POA: Diagnosis not present

## 2015-12-09 DIAGNOSIS — I082 Rheumatic disorders of both aortic and tricuspid valves: Secondary | ICD-10-CM | POA: Insufficient documentation

## 2015-12-09 DIAGNOSIS — Z7984 Long term (current) use of oral hypoglycemic drugs: Secondary | ICD-10-CM | POA: Insufficient documentation

## 2015-12-09 DIAGNOSIS — Z8249 Family history of ischemic heart disease and other diseases of the circulatory system: Secondary | ICD-10-CM | POA: Diagnosis not present

## 2015-12-09 DIAGNOSIS — Z6839 Body mass index (BMI) 39.0-39.9, adult: Secondary | ICD-10-CM | POA: Diagnosis not present

## 2015-12-09 DIAGNOSIS — Z9981 Dependence on supplemental oxygen: Secondary | ICD-10-CM | POA: Diagnosis not present

## 2015-12-09 DIAGNOSIS — D696 Thrombocytopenia, unspecified: Secondary | ICD-10-CM | POA: Diagnosis not present

## 2015-12-09 DIAGNOSIS — R0609 Other forms of dyspnea: Secondary | ICD-10-CM | POA: Diagnosis present

## 2015-12-09 HISTORY — PX: CARDIAC CATHETERIZATION: SHX172

## 2015-12-09 LAB — POCT I-STAT 3, VENOUS BLOOD GAS (G3P V)
ACID-BASE EXCESS: 1 mmol/L (ref 0.0–2.0)
BICARBONATE: 26.8 meq/L — AB (ref 20.0–24.0)
BICARBONATE: 28.3 meq/L — AB (ref 20.0–24.0)
Bicarbonate: 26.4 mEq/L — ABNORMAL HIGH (ref 20.0–24.0)
O2 SAT: 58 %
O2 Saturation: 57 %
O2 Saturation: 64 %
PH VEN: 7.319 — AB (ref 7.250–7.300)
PH VEN: 7.324 — AB (ref 7.250–7.300)
PO2 VEN: 33 mmHg (ref 31.0–45.0)
TCO2: 28 mmol/L (ref 0–100)
TCO2: 28 mmol/L (ref 0–100)
TCO2: 30 mmol/L (ref 0–100)
pCO2, Ven: 51.3 mmHg — ABNORMAL HIGH (ref 45.0–50.0)
pCO2, Ven: 51.6 mmHg — ABNORMAL HIGH (ref 45.0–50.0)
pCO2, Ven: 55.2 mmHg — ABNORMAL HIGH (ref 45.0–50.0)
pH, Ven: 7.319 — ABNORMAL HIGH (ref 7.250–7.300)
pO2, Ven: 33 mmHg (ref 31.0–45.0)
pO2, Ven: 37 mmHg (ref 31.0–45.0)

## 2015-12-09 LAB — GLUCOSE, CAPILLARY: Glucose-Capillary: 113 mg/dL — ABNORMAL HIGH (ref 65–99)

## 2015-12-09 SURGERY — RIGHT HEART CATH
Anesthesia: LOCAL

## 2015-12-09 MED ORDER — LIDOCAINE HCL (PF) 1 % IJ SOLN
INTRAMUSCULAR | Status: AC
  Filled 2015-12-09: qty 30

## 2015-12-09 MED ORDER — MIDAZOLAM HCL 2 MG/2ML IJ SOLN
INTRAMUSCULAR | Status: DC | PRN
  Administered 2015-12-09: 1 mg via INTRAVENOUS

## 2015-12-09 MED ORDER — SODIUM CHLORIDE 0.9 % IV SOLN
INTRAVENOUS | Status: DC
  Administered 2015-12-09: 08:00:00 via INTRAVENOUS

## 2015-12-09 MED ORDER — FENTANYL CITRATE (PF) 100 MCG/2ML IJ SOLN
INTRAMUSCULAR | Status: DC | PRN
  Administered 2015-12-09: 25 ug via INTRAVENOUS

## 2015-12-09 MED ORDER — FENTANYL CITRATE (PF) 100 MCG/2ML IJ SOLN
INTRAMUSCULAR | Status: AC
  Filled 2015-12-09: qty 2

## 2015-12-09 MED ORDER — NITROGLYCERIN 1 MG/10 ML FOR IR/CATH LAB
INTRA_ARTERIAL | Status: AC
Start: 2015-12-09 — End: 2015-12-09
  Filled 2015-12-09: qty 10

## 2015-12-09 MED ORDER — SODIUM CHLORIDE 0.9% FLUSH: 3.0000 mL | Freq: Two times a day (BID) | INTRAVENOUS | Status: DC

## 2015-12-09 MED ORDER — HEPARIN (PORCINE) IN NACL 2-0.9 UNIT/ML-% IJ SOLN
INTRAMUSCULAR | Status: AC
  Filled 2015-12-09: qty 500

## 2015-12-09 MED ORDER — NITROGLYCERIN 1 MG/10 ML FOR IR/CATH LAB
INTRA_ARTERIAL | Status: DC | PRN
  Administered 2015-12-09: 600 ug via INTRAVENOUS

## 2015-12-09 MED ORDER — LIDOCAINE HCL (PF) 1 % IJ SOLN
INTRAMUSCULAR | Status: DC | PRN
  Administered 2015-12-09: 1 mL

## 2015-12-09 MED ORDER — MICONAZOLE NITRATE 2 % EX CREA: 1.0000 "application " | TOPICAL_CREAM | Freq: Two times a day (BID) | CUTANEOUS | 0 refills | Status: DC

## 2015-12-09 MED ORDER — SODIUM CHLORIDE 0.9 % IV SOLN: 250.0000 mL | INTRAVENOUS | Status: DC | PRN

## 2015-12-09 MED ORDER — SODIUM CHLORIDE 0.9% FLUSH: 3.0000 mL | INTRAVENOUS | Status: DC | PRN

## 2015-12-09 MED ORDER — MIDAZOLAM HCL 2 MG/2ML IJ SOLN
INTRAMUSCULAR | Status: AC
  Filled 2015-12-09: qty 2

## 2015-12-09 MED ORDER — NITROGLYCERIN 1 MG/10 ML FOR IR/CATH LAB
INTRA_ARTERIAL | Status: AC
  Filled 2015-12-09: qty 10

## 2015-12-09 MED ORDER — HEPARIN (PORCINE) IN NACL 2-0.9 UNIT/ML-% IJ SOLN
INTRAMUSCULAR | Status: DC | PRN
  Administered 2015-12-09: 500 mL

## 2015-12-09 SURGICAL SUPPLY — 7 items
CATH BALLN WEDGE 5F 110CM (CATHETERS) ×2 IMPLANT
PROTECTION STATION PRESSURIZED (MISCELLANEOUS) ×2
SHEATH FAST CATH BRACH 5F 5CM (SHEATH) ×2 IMPLANT
STATION PROTECTION PRESSURIZED (MISCELLANEOUS) ×1 IMPLANT
TRANSDUCER W/MONITORING KIT (MISCELLANEOUS) ×2 IMPLANT
TUBING ART PRESS 72  MALE/FEM (TUBING) ×1
TUBING ART PRESS 72 MALE/FEM (TUBING) ×1 IMPLANT

## 2015-12-09 NOTE — Interval H&P Note (Signed)
History and Physical Interval Note:  12/09/2015 7:56 AM  [name]  has presented today for surgery, with the diagnosis of sob, pulmonary hypertension  The various methods of treatment have been discussed with the patient and family. After consideration of risks, benefits and other options for treatment, the patient has consented to  Procedure(s): Right Heart Cath (N/A) as a surgical intervention .  The patient's history has been reviewed, patient examined, no change in status, stable for surgery.  I have reviewed the patient's chart and labs.  Questions were answered to the patient's satisfaction.     [name]

## 2015-12-09 NOTE — Discharge Instructions (Addendum)
You appear to have fungal infection around your chest area. Keep area dry and apply the ointment prescribed twice daily, f/u with your PCP   CALL DR Irven Shelling OFFICE IF ANY PROBLEMS,QUESTIONS, OR CONCERNS; CALL IF ANY REDNESS,DRAINAGE,FEVER,PAIN, OR SWELLING RIGHT ARM IV SITE

## 2015-12-10 MED FILL — Nitroglycerin IV Soln 100 MCG/ML in D5W: INTRA_ARTERIAL | Qty: 10 | Status: AC

## 2015-12-19 DIAGNOSIS — E662 Morbid (severe) obesity with alveolar hypoventilation: Secondary | ICD-10-CM | POA: Diagnosis not present

## 2015-12-19 DIAGNOSIS — I272 Other secondary pulmonary hypertension: Secondary | ICD-10-CM | POA: Diagnosis not present

## 2015-12-19 DIAGNOSIS — R0602 Shortness of breath: Secondary | ICD-10-CM | POA: Diagnosis not present

## 2015-12-27 ENCOUNTER — Other Ambulatory Visit: Payer: Self-pay

## 2016-01-14 DIAGNOSIS — E119 Type 2 diabetes mellitus without complications: Secondary | ICD-10-CM | POA: Diagnosis not present

## 2016-01-20 DIAGNOSIS — E662 Morbid (severe) obesity with alveolar hypoventilation: Secondary | ICD-10-CM | POA: Diagnosis not present

## 2016-01-20 DIAGNOSIS — I272 Other secondary pulmonary hypertension: Secondary | ICD-10-CM | POA: Diagnosis not present

## 2016-01-20 DIAGNOSIS — R0602 Shortness of breath: Secondary | ICD-10-CM | POA: Diagnosis not present

## 2016-01-20 DIAGNOSIS — I5032 Chronic diastolic (congestive) heart failure: Secondary | ICD-10-CM | POA: Diagnosis not present

## 2016-01-29 DIAGNOSIS — E119 Type 2 diabetes mellitus without complications: Secondary | ICD-10-CM | POA: Diagnosis not present

## 2016-01-29 DIAGNOSIS — I10 Essential (primary) hypertension: Secondary | ICD-10-CM | POA: Diagnosis not present

## 2016-01-29 DIAGNOSIS — Z780 Asymptomatic menopausal state: Secondary | ICD-10-CM | POA: Diagnosis not present

## 2016-01-29 DIAGNOSIS — Z0000 Encounter for general adult medical examination without abnormal findings: Secondary | ICD-10-CM | POA: Diagnosis not present

## 2016-01-29 DIAGNOSIS — Z23 Encounter for immunization: Secondary | ICD-10-CM | POA: Diagnosis not present

## 2016-02-19 DIAGNOSIS — L820 Inflamed seborrheic keratosis: Secondary | ICD-10-CM | POA: Diagnosis not present

## 2016-02-19 DIAGNOSIS — X32XXXD Exposure to sunlight, subsequent encounter: Secondary | ICD-10-CM | POA: Diagnosis not present

## 2016-02-19 DIAGNOSIS — L570 Actinic keratosis: Secondary | ICD-10-CM | POA: Diagnosis not present

## 2016-02-19 DIAGNOSIS — C44321 Squamous cell carcinoma of skin of nose: Secondary | ICD-10-CM | POA: Diagnosis not present

## 2016-04-13 DIAGNOSIS — Z85828 Personal history of other malignant neoplasm of skin: Secondary | ICD-10-CM | POA: Diagnosis not present

## 2016-04-13 DIAGNOSIS — L03116 Cellulitis of left lower limb: Secondary | ICD-10-CM | POA: Diagnosis not present

## 2016-04-13 DIAGNOSIS — Z08 Encounter for follow-up examination after completed treatment for malignant neoplasm: Secondary | ICD-10-CM | POA: Diagnosis not present

## 2016-04-15 DIAGNOSIS — L0390 Cellulitis, unspecified: Secondary | ICD-10-CM | POA: Diagnosis not present

## 2016-04-15 DIAGNOSIS — M549 Dorsalgia, unspecified: Secondary | ICD-10-CM | POA: Diagnosis not present

## 2016-04-29 DIAGNOSIS — I5032 Chronic diastolic (congestive) heart failure: Secondary | ICD-10-CM | POA: Diagnosis not present

## 2016-04-29 DIAGNOSIS — R0602 Shortness of breath: Secondary | ICD-10-CM | POA: Diagnosis not present

## 2016-04-29 DIAGNOSIS — E662 Morbid (severe) obesity with alveolar hypoventilation: Secondary | ICD-10-CM | POA: Diagnosis not present

## 2016-04-29 DIAGNOSIS — I10 Essential (primary) hypertension: Secondary | ICD-10-CM | POA: Diagnosis not present

## 2016-05-20 DIAGNOSIS — B372 Candidiasis of skin and nail: Secondary | ICD-10-CM | POA: Diagnosis not present

## 2016-05-20 DIAGNOSIS — N9089 Other specified noninflammatory disorders of vulva and perineum: Secondary | ICD-10-CM | POA: Diagnosis not present

## 2016-05-20 DIAGNOSIS — Z1231 Encounter for screening mammogram for malignant neoplasm of breast: Secondary | ICD-10-CM | POA: Diagnosis not present

## 2016-05-28 DIAGNOSIS — H401131 Primary open-angle glaucoma, bilateral, mild stage: Secondary | ICD-10-CM | POA: Diagnosis not present

## 2016-05-28 DIAGNOSIS — H2513 Age-related nuclear cataract, bilateral: Secondary | ICD-10-CM | POA: Diagnosis not present

## 2016-05-28 DIAGNOSIS — E119 Type 2 diabetes mellitus without complications: Secondary | ICD-10-CM | POA: Diagnosis not present

## 2016-05-29 DIAGNOSIS — E7800 Pure hypercholesterolemia, unspecified: Secondary | ICD-10-CM | POA: Diagnosis not present

## 2016-05-29 DIAGNOSIS — Z780 Asymptomatic menopausal state: Secondary | ICD-10-CM | POA: Diagnosis not present

## 2016-05-29 DIAGNOSIS — I10 Essential (primary) hypertension: Secondary | ICD-10-CM | POA: Diagnosis not present

## 2016-05-29 DIAGNOSIS — E119 Type 2 diabetes mellitus without complications: Secondary | ICD-10-CM | POA: Diagnosis not present

## 2016-06-02 DIAGNOSIS — M5441 Lumbago with sciatica, right side: Secondary | ICD-10-CM | POA: Diagnosis not present

## 2016-06-02 DIAGNOSIS — G8929 Other chronic pain: Secondary | ICD-10-CM | POA: Diagnosis not present

## 2016-06-02 DIAGNOSIS — M5442 Lumbago with sciatica, left side: Secondary | ICD-10-CM | POA: Diagnosis not present

## 2016-06-02 DIAGNOSIS — E119 Type 2 diabetes mellitus without complications: Secondary | ICD-10-CM | POA: Diagnosis not present

## 2016-06-11 ENCOUNTER — Other Ambulatory Visit: Payer: Self-pay | Admitting: Internal Medicine

## 2016-06-11 DIAGNOSIS — M545 Low back pain: Secondary | ICD-10-CM | POA: Diagnosis not present

## 2016-06-11 DIAGNOSIS — M79606 Pain in leg, unspecified: Secondary | ICD-10-CM | POA: Diagnosis not present

## 2016-06-13 ENCOUNTER — Ambulatory Visit
Admission: RE | Admit: 2016-06-13 | Discharge: 2016-06-13 | Disposition: A | Discharge status: Home / Self Care | Payer: Self-pay | Source: Ambulatory Visit | Attending: Internal Medicine | Admitting: Internal Medicine

## 2016-06-13 DIAGNOSIS — M47816 Spondylosis without myelopathy or radiculopathy, lumbar region: Secondary | ICD-10-CM | POA: Diagnosis not present

## 2016-06-13 DIAGNOSIS — M545 Low back pain: Secondary | ICD-10-CM

## 2016-06-13 DIAGNOSIS — M79606 Pain in leg, unspecified: Secondary | ICD-10-CM

## 2016-06-23 ENCOUNTER — Other Ambulatory Visit: Payer: Self-pay

## 2016-06-30 DIAGNOSIS — R292 Abnormal reflex: Secondary | ICD-10-CM | POA: Diagnosis not present

## 2016-06-30 DIAGNOSIS — Z6836 Body mass index (BMI) 36.0-36.9, adult: Secondary | ICD-10-CM | POA: Diagnosis not present

## 2016-06-30 DIAGNOSIS — I10 Essential (primary) hypertension: Secondary | ICD-10-CM | POA: Diagnosis not present

## 2016-06-30 DIAGNOSIS — M545 Low back pain: Secondary | ICD-10-CM | POA: Diagnosis not present

## 2016-06-30 DIAGNOSIS — R200 Anesthesia of skin: Secondary | ICD-10-CM | POA: Diagnosis not present

## 2016-07-10 DIAGNOSIS — E119 Type 2 diabetes mellitus without complications: Secondary | ICD-10-CM | POA: Diagnosis not present

## 2016-07-10 DIAGNOSIS — H5213 Myopia, bilateral: Secondary | ICD-10-CM | POA: Diagnosis not present

## 2016-07-10 DIAGNOSIS — H2513 Age-related nuclear cataract, bilateral: Secondary | ICD-10-CM | POA: Diagnosis not present

## 2016-07-10 DIAGNOSIS — H43813 Vitreous degeneration, bilateral: Secondary | ICD-10-CM | POA: Diagnosis not present

## 2016-07-13 ENCOUNTER — Other Ambulatory Visit: Payer: Self-pay | Admitting: Neurosurgery

## 2016-07-13 DIAGNOSIS — M545 Low back pain: Secondary | ICD-10-CM

## 2016-07-13 DIAGNOSIS — R292 Abnormal reflex: Secondary | ICD-10-CM

## 2016-07-13 DIAGNOSIS — G8929 Other chronic pain: Secondary | ICD-10-CM

## 2016-07-20 ENCOUNTER — Ambulatory Visit
Admission: RE | Admit: 2016-07-20 | Discharge: 2016-07-20 | Disposition: A | Discharge status: Home / Self Care | Payer: Medicare Other | Source: Ambulatory Visit | Attending: Neurosurgery | Admitting: Neurosurgery

## 2016-07-20 ENCOUNTER — Other Ambulatory Visit: Payer: Medicare Other

## 2016-07-20 DIAGNOSIS — M5124 Other intervertebral disc displacement, thoracic region: Secondary | ICD-10-CM | POA: Diagnosis not present

## 2016-07-20 DIAGNOSIS — G8929 Other chronic pain: Secondary | ICD-10-CM

## 2016-07-20 DIAGNOSIS — R292 Abnormal reflex: Secondary | ICD-10-CM

## 2016-07-20 DIAGNOSIS — M545 Low back pain: Secondary | ICD-10-CM

## 2016-07-20 DIAGNOSIS — M5126 Other intervertebral disc displacement, lumbar region: Secondary | ICD-10-CM | POA: Diagnosis not present

## 2016-07-20 MED ORDER — IOPAMIDOL (ISOVUE-M 300) INJECTION 61%
10.0000 mL | Freq: Once | INTRAMUSCULAR | Status: AC | PRN
  Administered 2016-07-20: 10 mL via INTRATHECAL

## 2016-07-20 MED ORDER — OXYCODONE-ACETAMINOPHEN 5-325 MG PO TABS
1.0000 | ORAL_TABLET | Freq: Once | ORAL | Status: AC
  Administered 2016-07-20: 1 via ORAL

## 2016-07-20 MED ORDER — ONDANSETRON HCL 4 MG/2ML IJ SOLN
4.0000 mg | Freq: Once | INTRAMUSCULAR | Status: AC
  Administered 2016-07-20: 4 mg via INTRAMUSCULAR

## 2016-07-20 MED ORDER — ONDANSETRON HCL 4 MG/2ML IJ SOLN: 4.0000 mg | Freq: Four times a day (QID) | INTRAMUSCULAR | Status: DC | PRN

## 2016-07-20 MED ORDER — DIAZEPAM 5 MG PO TABS
5.0000 mg | ORAL_TABLET | Freq: Once | ORAL | Status: AC
  Administered 2016-07-20: 5 mg via ORAL

## 2016-07-20 MED ORDER — MEPERIDINE HCL 100 MG/ML IJ SOLN
50.0000 mg | Freq: Once | INTRAMUSCULAR | Status: AC
  Administered 2016-07-20: 50 mg via INTRAMUSCULAR

## 2016-07-20 NOTE — Discharge Instructions (Signed)

## 2016-07-28 ENCOUNTER — Telehealth: Payer: Self-pay | Admitting: *Deleted

## 2016-07-28 NOTE — Telephone Encounter (Signed)
Pt had called in saying the treated areas (sclero) look good but the areas are painful. Wondering if that is normal. I left her a message saying that is an inflammatory process where the body is trying to dissolve the treated/closed veins. I suggested Ibuprofen, heat and wearing her stockings. Asked her to call me if she has any further concerns.

## 2016-09-02 DIAGNOSIS — M5136 Other intervertebral disc degeneration, lumbar region: Secondary | ICD-10-CM | POA: Diagnosis not present

## 2016-09-02 DIAGNOSIS — M545 Low back pain: Secondary | ICD-10-CM | POA: Diagnosis not present

## 2016-09-10 DIAGNOSIS — M5136 Other intervertebral disc degeneration, lumbar region: Secondary | ICD-10-CM | POA: Diagnosis not present

## 2016-09-10 DIAGNOSIS — M545 Low back pain: Secondary | ICD-10-CM | POA: Diagnosis not present

## 2016-10-12 DIAGNOSIS — M5136 Other intervertebral disc degeneration, lumbar region: Secondary | ICD-10-CM | POA: Diagnosis not present

## 2016-10-20 DIAGNOSIS — R0602 Shortness of breath: Secondary | ICD-10-CM | POA: Diagnosis not present

## 2016-10-20 DIAGNOSIS — R600 Localized edema: Secondary | ICD-10-CM | POA: Diagnosis not present

## 2016-10-24 ENCOUNTER — Inpatient Hospital Stay (HOSPITAL_COMMUNITY)
Admission: EM | Admit: 2016-10-24 | Discharge: 2016-10-27 | DRG: 641 | Disposition: A | Discharge status: Home / Self Care | Payer: Medicare Other | Attending: Cardiovascular Disease | Admitting: Cardiovascular Disease

## 2016-10-24 ENCOUNTER — Encounter (HOSPITAL_COMMUNITY): Payer: Self-pay | Admitting: Emergency Medicine

## 2016-10-24 ENCOUNTER — Emergency Department (HOSPITAL_COMMUNITY): Payer: Medicare Other

## 2016-10-24 DIAGNOSIS — I110 Hypertensive heart disease with heart failure: Secondary | ICD-10-CM | POA: Diagnosis present

## 2016-10-24 DIAGNOSIS — R7989 Other specified abnormal findings of blood chemistry: Secondary | ICD-10-CM | POA: Diagnosis present

## 2016-10-24 DIAGNOSIS — D638 Anemia in other chronic diseases classified elsewhere: Secondary | ICD-10-CM | POA: Diagnosis present

## 2016-10-24 DIAGNOSIS — R55 Syncope and collapse: Secondary | ICD-10-CM | POA: Diagnosis present

## 2016-10-24 DIAGNOSIS — E871 Hypo-osmolality and hyponatremia: Secondary | ICD-10-CM | POA: Diagnosis present

## 2016-10-24 DIAGNOSIS — E119 Type 2 diabetes mellitus without complications: Secondary | ICD-10-CM | POA: Diagnosis not present

## 2016-10-24 DIAGNOSIS — E860 Dehydration: Principal | ICD-10-CM | POA: Diagnosis present

## 2016-10-24 DIAGNOSIS — E785 Hyperlipidemia, unspecified: Secondary | ICD-10-CM | POA: Diagnosis present

## 2016-10-24 DIAGNOSIS — I959 Hypotension, unspecified: Secondary | ICD-10-CM | POA: Diagnosis not present

## 2016-10-24 DIAGNOSIS — I350 Nonrheumatic aortic (valve) stenosis: Secondary | ICD-10-CM | POA: Diagnosis not present

## 2016-10-24 DIAGNOSIS — I509 Heart failure, unspecified: Secondary | ICD-10-CM

## 2016-10-24 DIAGNOSIS — Z6836 Body mass index (BMI) 36.0-36.9, adult: Secondary | ICD-10-CM

## 2016-10-24 DIAGNOSIS — Z823 Family history of stroke: Secondary | ICD-10-CM

## 2016-10-24 DIAGNOSIS — I5022 Chronic systolic (congestive) heart failure: Secondary | ICD-10-CM | POA: Diagnosis present

## 2016-10-24 DIAGNOSIS — R404 Transient alteration of awareness: Secondary | ICD-10-CM | POA: Diagnosis not present

## 2016-10-24 DIAGNOSIS — I5030 Unspecified diastolic (congestive) heart failure: Secondary | ICD-10-CM | POA: Diagnosis not present

## 2016-10-24 DIAGNOSIS — I10 Essential (primary) hypertension: Secondary | ICD-10-CM | POA: Diagnosis not present

## 2016-10-24 DIAGNOSIS — E669 Obesity, unspecified: Secondary | ICD-10-CM | POA: Diagnosis present

## 2016-10-24 DIAGNOSIS — J449 Chronic obstructive pulmonary disease, unspecified: Secondary | ICD-10-CM | POA: Diagnosis present

## 2016-10-24 DIAGNOSIS — R5381 Other malaise: Secondary | ICD-10-CM | POA: Diagnosis not present

## 2016-10-24 DIAGNOSIS — H409 Unspecified glaucoma: Secondary | ICD-10-CM | POA: Diagnosis present

## 2016-10-24 DIAGNOSIS — E6609 Other obesity due to excess calories: Secondary | ICD-10-CM | POA: Diagnosis not present

## 2016-10-24 HISTORY — DX: Heart failure, unspecified: I50.9

## 2016-10-24 LAB — URINALYSIS, ROUTINE W REFLEX MICROSCOPIC
Bilirubin Urine: NEGATIVE
GLUCOSE, UA: NEGATIVE mg/dL
HGB URINE DIPSTICK: NEGATIVE
KETONES UR: NEGATIVE mg/dL
NITRITE: NEGATIVE
PH: 5 (ref 5.0–8.0)
PROTEIN: NEGATIVE mg/dL
Specific Gravity, Urine: 1.016 (ref 1.005–1.030)

## 2016-10-24 LAB — CBC WITH DIFFERENTIAL/PLATELET
BASOS ABS: 0 10*3/uL (ref 0.0–0.1)
BASOS PCT: 0 %
Eosinophils Absolute: 0.2 10*3/uL (ref 0.0–0.7)
Eosinophils Relative: 2 %
HEMATOCRIT: 35.8 % — AB (ref 36.0–46.0)
HEMOGLOBIN: 11.6 g/dL — AB (ref 12.0–15.0)
LYMPHS PCT: 10 %
Lymphs Abs: 1.3 10*3/uL (ref 0.7–4.0)
MCH: 28.5 pg (ref 26.0–34.0)
MCHC: 32.4 g/dL (ref 30.0–36.0)
MCV: 88 fL (ref 78.0–100.0)
MONO ABS: 1.1 10*3/uL — AB (ref 0.1–1.0)
Monocytes Relative: 9 %
NEUTROS ABS: 9.4 10*3/uL — AB (ref 1.7–7.7)
NEUTROS PCT: 78 %
Platelets: 135 10*3/uL — ABNORMAL LOW (ref 150–400)
RBC: 4.07 MIL/uL (ref 3.87–5.11)
RDW: 15 % (ref 11.5–15.5)
WBC: 12 10*3/uL — ABNORMAL HIGH (ref 4.0–10.5)

## 2016-10-24 LAB — COMPREHENSIVE METABOLIC PANEL
ALT: 13 U/L — ABNORMAL LOW (ref 14–54)
AST: 18 U/L (ref 15–41)
Albumin: 3.5 g/dL (ref 3.5–5.0)
Alkaline Phosphatase: 67 U/L (ref 38–126)
Anion gap: 9 (ref 5–15)
BUN: 43 mg/dL — ABNORMAL HIGH (ref 6–20)
CHLORIDE: 93 mmol/L — AB (ref 101–111)
CO2: 29 mmol/L (ref 22–32)
CREATININE: 1.46 mg/dL — AB (ref 0.44–1.00)
Calcium: 9.3 mg/dL (ref 8.9–10.3)
GFR calc non Af Amer: 32 mL/min — ABNORMAL LOW (ref >60)
GFR, EST AFRICAN AMERICAN: 37 mL/min — AB (ref >60)
Glucose, Bld: 139 mg/dL — ABNORMAL HIGH (ref 65–99)
POTASSIUM: 4.8 mmol/L (ref 3.5–5.1)
SODIUM: 131 mmol/L — AB (ref 135–145)
Total Bilirubin: 0.9 mg/dL (ref 0.3–1.2)
Total Protein: 6.2 g/dL — ABNORMAL LOW (ref 6.5–8.1)

## 2016-10-24 LAB — GLUCOSE, CAPILLARY
Glucose-Capillary: 177 mg/dL — ABNORMAL HIGH (ref 65–99)
Glucose-Capillary: 124 mg/dL — ABNORMAL HIGH (ref 65–99)

## 2016-10-24 LAB — BRAIN NATRIURETIC PEPTIDE: B NATRIURETIC PEPTIDE 5: 70.7 pg/mL (ref 0.0–100.0)

## 2016-10-24 LAB — LIPASE, BLOOD: LIPASE: 31 U/L (ref 11–51)

## 2016-10-24 MED ORDER — INSULIN ASPART 100 UNIT/ML ~~LOC~~ SOLN
0.0000 [IU] | Freq: Three times a day (TID) | SUBCUTANEOUS | Status: DC
  Administered 2016-10-24: 3 [IU] via SUBCUTANEOUS
  Administered 2016-10-25 – 2016-10-26 (×3): 2 [IU] via SUBCUTANEOUS

## 2016-10-24 MED ORDER — ADULT MULTIVITAMIN W/MINERALS CH
1.0000 | ORAL_TABLET | Freq: Every day | ORAL | Status: DC
  Administered 2016-10-25 – 2016-10-27 (×3): 1 via ORAL
  Filled 2016-10-24 (×4): qty 1

## 2016-10-24 MED ORDER — ASPIRIN EC 81 MG PO TBEC
81.0000 mg | DELAYED_RELEASE_TABLET | Freq: Every day | ORAL | Status: DC
  Administered 2016-10-25 – 2016-10-27 (×3): 81 mg via ORAL
  Filled 2016-10-24 (×3): qty 1

## 2016-10-24 MED ORDER — LIRAGLUTIDE 18 MG/3ML ~~LOC~~ SOPN: 0.6000 mg | PEN_INJECTOR | Freq: Every day | SUBCUTANEOUS | Status: DC

## 2016-10-24 MED ORDER — LATANOPROST 0.005 % OP SOLN
1.0000 [drp] | Freq: Every day | OPHTHALMIC | Status: DC
  Administered 2016-10-24 – 2016-10-26 (×3): 1 [drp] via OPHTHALMIC
  Filled 2016-10-24: qty 2.5

## 2016-10-24 MED ORDER — HEPARIN SODIUM (PORCINE) 5000 UNIT/ML IJ SOLN
5000.0000 [IU] | Freq: Three times a day (TID) | INTRAMUSCULAR | Status: DC
  Administered 2016-10-24 – 2016-10-27 (×7): 5000 [IU] via SUBCUTANEOUS
  Filled 2016-10-24 (×7): qty 1

## 2016-10-24 MED ORDER — SODIUM CHLORIDE 0.9 % IV BOLUS (SEPSIS)
500.0000 mL | Freq: Once | INTRAVENOUS | Status: AC
  Administered 2016-10-24: 500 mL via INTRAVENOUS

## 2016-10-24 NOTE — H&P (Signed)
Referring Physician:  [name] is an 83 y.o. female.                       Chief Complaint: Weakness, confusion  HPI: 83 year old female was started on lasix for leg edema. She has PMH of CHF, COPD, DM, II, Hyperlipidemia and hypertension. She feels little better with 500 ml fluid resuscitation in ER.  Past Medical History:  Diagnosis Date  . CHF (congestive heart failure) ([place])   . COPD (chronic obstructive pulmonary disease) ([place])   . Diabetes mellitus   . Glaucoma   . Hyperlipidemia   . Hypertension       Past Surgical History:  Procedure Laterality Date  . BACK SURGERY    . CARDIAC CATHETERIZATION N/A 12/09/2015   Procedure: Right Heart Cath;  Surgeon: Adrian Prows, MD;  Location: [facility];  Service: Cardiovascular;  Laterality: N/A;  . CHOLECYSTECTOMY  2000  . HERNIA REPAIR  2007  . rupture disk  1970"[name]  . SHOULDER ARTHROSCOPY WITH ROTATOR CUFF REPAIR  1999   rt shoulder    Family History  Problem Relation Age of Onset  . Stroke Mother    Social History:  reports that she has never smoked. She does not have any smokeless tobacco history on file. She reports that she does not drink alcohol or use drugs.  Allergies: No Known Allergies   (Not in a hospital admission)  Results for orders placed or performed during the hospital encounter of 10/24/16 (from the past 48 hour([name]))  Comprehensive metabolic panel     Status: Abnormal   Collection Time: 10/24/16 12:39 PM  Result Value Ref Range   Sodium 131 (L) 135 - 145 mmol/L   Potassium 4.8 3.5 - 5.1 mmol/L   Chloride 93 (L) 101 - 111 mmol/L   CO2 29 22 - 32 mmol/L   Glucose, Bld 139 (H) 65 - 99 mg/dL   BUN 43 (H) 6 - 20 mg/dL   Creatinine, Ser 1.46 (H) 0.44 - 1.00 mg/dL   Calcium 9.3 8.9 - 10.3 mg/dL   Total Protein 6.2 (L) 6.5 - 8.1 g/dL   Albumin 3.5 3.5 - 5.0 g/dL   AST 18 15 - 41 U/L   ALT 13 (L) 14 - 54 U/L   Alkaline Phosphatase 67 38 - 126 U/L   Total Bilirubin 0.9 0.3 - 1.2 mg/dL   GFR calc  non Af Amer 32 (L) >60 mL/min   GFR calc Af Amer 37 (L) >60 mL/min    Comment: (NOTE) The eGFR has been calculated using the CKD EPI equation. This calculation has not been validated in all clinical situations. eGFR'[name] persistently <60 mL/min signify possible Chronic Kidney Disease.    Anion gap 9 5 - 15  Lipase, blood     Status: None   Collection Time: 10/24/16 12:39 PM  Result Value Ref Range   Lipase 31 11 - 51 U/L  CBC with Differential     Status: Abnormal   Collection Time: 10/24/16 12:39 PM  Result Value Ref Range   WBC 12.0 (H) 4.0 - 10.5 K/uL   RBC 4.07 3.87 - 5.11 MIL/uL   Hemoglobin 11.6 (L) 12.0 - 15.0 g/dL   HCT 35.8 (L) 36.0 - 46.0 %   MCV 88.0 78.0 - 100.0 fL   MCH 28.5 26.0 - 34.0 pg   MCHC 32.4 30.0 - 36.0 g/dL   RDW 15.0 11.5 - 15.5 %  Platelets 135 (L) 150 - 400 K/uL   Neutrophils Relative % 78 %   Neutro Abs 9.4 (H) 1.7 - 7.7 K/uL   Lymphocytes Relative 10 %   Lymphs Abs 1.3 0.7 - 4.0 K/uL   Monocytes Relative 9 %   Monocytes Absolute 1.1 (H) 0.1 - 1.0 K/uL   Eosinophils Relative 2 %   Eosinophils Absolute 0.2 0.0 - 0.7 K/uL   Basophils Relative 0 %   Basophils Absolute 0.0 0.0 - 0.1 K/uL  Brain natriuretic peptide     Status: None   Collection Time: 10/24/16 12:45 PM  Result Value Ref Range   B Natriuretic Peptide 70.7 0.0 - 100.0 pg/mL  Urinalysis, Routine w reflex microscopic     Status: Abnormal   Collection Time: 10/24/16  1:50 PM  Result Value Ref Range   Color, Urine YELLOW YELLOW   APPearance HAZY (A) CLEAR   Specific Gravity, Urine 1.016 1.005 - 1.030   pH 5.0 5.0 - 8.0   Glucose, UA NEGATIVE NEGATIVE mg/dL   Hgb urine dipstick NEGATIVE NEGATIVE   Bilirubin Urine NEGATIVE NEGATIVE   Ketones, ur NEGATIVE NEGATIVE mg/dL   Protein, ur NEGATIVE NEGATIVE mg/dL   Nitrite NEGATIVE NEGATIVE   Leukocytes, UA MODERATE (A) NEGATIVE   RBC / HPF 0-5 0 - 5 RBC/hpf   WBC, UA 6-30 0 - 5 WBC/hpf   Bacteria, UA RARE (A) NONE SEEN   Squamous  Epithelial / LPF 0-5 (A) NONE SEEN   Mucous PRESENT    Hyaline Casts, UA PRESENT    Non Squamous Epithelial 0-5 (A) NONE SEEN   Dg Chest 2 View  Result Date: 10/24/2016 CLINICAL DATA:  Syncope. EXAM: CHEST  2 VIEW COMPARISON:  10/20/2016 FINDINGS: Cardiac silhouette is normal in size. No mediastinal or hilar masses. No evidence of adenopathy. Clear lungs.  No pleural effusion or pneumothorax. Skeletal structures are demineralized. No acute skeletal abnormality. IMPRESSION: No acute cardiopulmonary disease. Electronically Signed   By: Lajean Manes M.D.   On: 10/24/2016 12:29    Review Of Systems Constitutional: No fever, chills, weight loss or gain. Eyes: No vision change, wears glasses. No discharge or pain. Ears: No hearing loss, No tinnitus. Respiratory: No asthma, COPD, pneumonias. No shortness of breath. No hemoptysis. Cardiovascular: No chest pain, palpitation, leg edema. Gastrointestinal: Occasional nausea, vomiting, diarrhea, constipation. No GI bleed. No hepatitis. Genitourinary: No dysuria, hematuria, kidney stone. No incontinance. Neurological: H/O headache, no stroke, seizures.  Psychiatry: No psych facility admission for anxiety, depression, suicide. No detox. Skin: No rash. Musculoskeletal: No joint pain, fibromyalgia. No neck pain, back pain. Lymphadenopathy: No lymphadenopathy. Hematology: No anemia or easy bruising.   Blood pressure (!) 107/44, pulse 72, temperature 97.7 F (36.5 C), temperature source Oral, resp. rate 12, height 5' 4"  (1.626 m), weight 96.2 kg (212 lb), SpO2 97 %. Body mass index is 36.39 kg/m. General appearance: alert, cooperative, appears stated age and no distress Head: Normocephalic, atraumatic. Eyes: Blue eyes, pink conjunctiva, corneas clear. PERRL, EOM'[name] intact. Neck: No adenopathy, no carotid bruit, no JVD, supple, symmetrical, trachea midline and thyroid not enlarged. Resp: Clear to auscultation bilaterally. Cardio: Regular rate and  rhythm, S1, S2 normal, II/VI systolic murmur, no click, rub or gallop GI: Soft, non-tender; bowel sounds normal; no organomegaly. Extremities: No edema, cyanosis or clubbing. Skin: Warm and dry. Thin with few ecchymotic areas over extremities. Neurologic: Alert and oriented X 3, normal strength. Normal coordination.  Assessment/Plan Syncope Dehydration H/O HTN DM, II Obesity Chronic left  heart systolic failure COPD Pre-renal azotemia  Place in observation. IV fluids as tolerated  Teresa Estrella S, MD  10/24/2016, 2:35 PM

## 2016-10-24 NOTE — Progress Notes (Signed)
Report received from ED 

## 2016-10-24 NOTE — Progress Notes (Signed)
Pt has arrived to [place].

## 2016-10-24 NOTE — ED Triage Notes (Addendum)
Pt BIB EMS from home for "dehydration." Pt was sitting on back porch today "for a while" and had onset of diaphoresis, confusion, and fatigue; per EMS pt went inside under [facility] and symptoms resolved prior to their arrival. Pt took 2 Tylenol PTA for generalized pain. Pt A&Ox4; denies CP or SOB; resp e/u. NAD at this time. BP 96/47 on arrival and NSR. Pt orthostatic positive on ED assessment.    Pt recently seen by her PCP and cardiologist last week for syncopal episode; put on lasix. Pt feels that she might not be getting enough to drink in comparison to how much she is urinating.

## 2016-10-24 NOTE — ED Provider Notes (Signed)
Emergency Department Provider Note   I have reviewed the triage vital signs and the nursing notes.   HISTORY  Chief Complaint Dehydration   HPI [name] is a 83 y.o. female with PMH of CHF, COPD, DM, HLD, and HTN presents to the emergency room in for evaluation of near syncope today. She had an episode of pain 2 weeks ago but did not seek medical care that time. She called her cardiologist, Dr. Einar Gip, shortly afterwards with some worsening lower extremity edema. She was started on lasix BID for the last 2 days and was decreased to once daily today. She reports decreased lower extremity edema along with significant increase in urine output. She has been drinking fluids. He denies fevers, chills. She denies any chest pain or heart palpitations. Today she was sitting on the back porch when she suddenly felt very lightheaded as if she might pass out again but did not pass out fully. No dysuria, hesitancy, urgency. No radiation of symptoms. No modifying factors. No other medication changes.    Past Medical History:  Diagnosis Date  . CHF (congestive heart failure) ([place])   . COPD (chronic obstructive pulmonary disease) ([place])   . Diabetes mellitus   . Glaucoma   . Hyperlipidemia   . Hypertension     Patient Active Problem List   Diagnosis Date Noted  . Syncope 10/24/2016  . Pulmonary hypertension ([place]) 12/07/2015  . Varicose veins of leg with complications 89/37/3428  . Varicose veins of left lower extremity with complications 76/81/1572  . Varicose veins of lower extremities with other complications 62/06/5595  . Lipoma of back 07/19/2012    Past Surgical History:  Procedure Laterality Date  . BACK SURGERY    . CARDIAC CATHETERIZATION N/A 12/09/2015   Procedure: Right Heart Cath;  Surgeon: Adrian Prows, MD;  Location: [facility];  Service: Cardiovascular;  Laterality: N/A;  . CHOLECYSTECTOMY  2000  . HERNIA REPAIR  2007  . rupture disk  1970"s  . SHOULDER  ARTHROSCOPY WITH ROTATOR CUFF REPAIR  1999   rt shoulder      Allergies Patient has no known allergies.  Family History  Problem Relation Age of Onset  . Stroke Mother     Social History Social History  Substance Use Topics  . Smoking status: Never Smoker  . Smokeless tobacco: Never Used  . Alcohol use No    Review of Systems  Constitutional: No fever/chills. Intermittent lightheadedness and near-syncope today.  Eyes: No visual changes. ENT: No sore throat. Cardiovascular: Denies chest pain. Respiratory: Denies shortness of breath. Gastrointestinal: No abdominal pain.  No nausea, no vomiting.  No diarrhea.  No constipation. Genitourinary: Negative for dysuria. Musculoskeletal: Negative for back pain. Skin: Negative for rash. Neurological: Negative for headaches, focal weakness or numbness.  10-point ROS otherwise negative.  ____________________________________________   PHYSICAL EXAM:  VITAL SIGNS: ED Triage Vitals  Enc Vitals Group     BP 10/24/16 1120 (!) 107/50     Pulse Rate 10/24/16 1120 89     Resp 10/24/16 1120 16     Temp 10/24/16 1120 97.7 F (36.5 C)     Temp Source 10/24/16 1120 Oral     SpO2 10/24/16 1118 96 %     Weight 10/24/16 1124 212 lb (96.2 kg)     Height 10/24/16 1124 5\' 4"  (1.626 m)   Constitutional: Alert and oriented. Well appearing and in no acute distress. Eyes: Conjunctivae are normal. Head: Atraumatic. Nose: No congestion/rhinnorhea. Mouth/Throat:  Mucous membranes are moist.  Oropharynx non-erythematous. Neck: No stridor. Cardiovascular: Normal rate, regular rhythm. Good peripheral circulation. Grossly normal heart sounds.   Respiratory: Normal respiratory effort.  No retractions. Lungs CTAB. Gastrointestinal: Soft and nontender. No distention.  Musculoskeletal: No lower extremity tenderness nor edema. No gross deformities of extremities. Neurologic:  Normal speech and language. No gross focal neurologic deficits are  appreciated.  Skin:  Skin is warm, dry and intact. No rash noted.   ____________________________________________   LABS (all labs ordered are listed, but only abnormal results are displayed)  Labs Reviewed  COMPREHENSIVE METABOLIC PANEL - Abnormal; Notable for the following:       Result Value   Sodium 131 (*)    Chloride 93 (*)    Glucose, Bld 139 (*)    BUN 43 (*)    Creatinine, Ser 1.46 (*)    Total Protein 6.2 (*)    ALT 13 (*)    GFR calc non Af Amer 32 (*)    GFR calc Af Amer 37 (*)    All other components within normal limits  CBC WITH DIFFERENTIAL/PLATELET - Abnormal; Notable for the following:    WBC 12.0 (*)    Hemoglobin 11.6 (*)    HCT 35.8 (*)    Platelets 135 (*)    Neutro Abs 9.4 (*)    Monocytes Absolute 1.1 (*)    All other components within normal limits  URINALYSIS, ROUTINE W REFLEX MICROSCOPIC - Abnormal; Notable for the following:    APPearance HAZY (*)    Leukocytes, UA MODERATE (*)    Bacteria, UA RARE (*)    Squamous Epithelial / LPF 0-5 (*)    Non Squamous Epithelial 0-5 (*)    All other components within normal limits  GLUCOSE, CAPILLARY - Abnormal; Notable for the following:    Glucose-Capillary 177 (*)    All other components within normal limits  LIPASE, BLOOD  BRAIN NATRIURETIC PEPTIDE  HEMOGLOBIN F7T  BASIC METABOLIC PANEL  CBC   ____________________________________________  EKG   EKG Interpretation  Date/Time:  Saturday October 24 2016 11:26:59 EDT Ventricular Rate:  73 PR Interval:    QRS Duration: 85 QT Interval:  405 QTC Calculation: 447 R Axis:   -50 Text Interpretation:  Sinus or ectopic atrial rhythm Prolonged PR interval Left anterior fascicular block No STEMI.  Confirmed by Nanda Quinton [phone]) on 10/24/2016 1:03:24 PM       ____________________________________________  RADIOLOGY  Dg Chest 2 View  Result Date: 10/24/2016 CLINICAL DATA:  Syncope. EXAM: CHEST  2 VIEW COMPARISON:  10/20/2016 FINDINGS: Cardiac  silhouette is normal in size. No mediastinal or hilar masses. No evidence of adenopathy. Clear lungs.  No pleural effusion or pneumothorax. Skeletal structures are demineralized. No acute skeletal abnormality. IMPRESSION: No acute cardiopulmonary disease. Electronically Signed   By: Lajean Manes M.D.   On: 10/24/2016 12:29    ____________________________________________   PROCEDURES  Procedure(s) performed:   Procedures  CRITICAL CARE Performed by: Margette Fast Total critical care time: 30 minutes Critical care time was exclusive of separately billable procedures and treating other patients. Critical care was necessary to treat or prevent imminent or life-threatening deterioration. Critical care was time spent personally by me on the following activities: development of treatment plan with patient and/or surrogate as well as nursing, discussions with consultants, evaluation of patient's response to treatment, examination of patient, obtaining history from patient or surrogate, ordering and performing treatments and interventions, ordering and review of laboratory studies,  ordering and review of radiographic studies, pulse oximetry and re-evaluation of patient's condition.  Nanda Quinton, MD Emergency Medicine  ____________________________________________   INITIAL IMPRESSION / ASSESSMENT AND PLAN / ED COURSE  Pertinent labs & imaging results that were available during my care of the patient were reviewed by me and considered in my medical decision making (see chart for details).  Patient presents to the emergency department for evaluation of near syncope. She has undergone outpatient diuresis over the past 2 days with lower extremity edema. The edema has resolved. Suspect dehydration secondary to diuresis as the cause of her syncope. Orthostatic vital signs on arrival show SBP of 74 upon standing. Patient was symptomatic during this maneuver. I do not have her most recent echo available  to me but she does have CHF listed as diagnosis. We'll start with 500 mL bolus and obtain chest x-ray, labs, and reassess. Plan to discuss the case with the patient's cardiologist regarding further w/u.   Patient with slightly improved BP with gentle IVF. Labs and imaging reviewed.   Discussed patient's case with Cardiology, Dr. Doylene Canard. Patient and family (if present) updated with plan. Care transferred to Cardiology service.  I reviewed all nursing notes, vitals, pertinent old records, EKGs, labs, imaging (as available).  ____________________________________________  FINAL CLINICAL IMPRESSION(S) / ED DIAGNOSES  Final diagnoses:  Hypotension, unspecified hypotension type  Congestive heart failure, unspecified HF chronicity, unspecified heart failure type ([place])     MEDICATIONS GIVEN DURING THIS VISIT:  Medications  heparin injection 5,000 Units (not administered)  aspirin EC tablet 81 mg (81 mg Oral Not Given 10/24/16 1620)  latanoprost (XALATAN) 0.005 % ophthalmic solution 1 drop (not administered)  liraglutide SOPN 0.6 mg (0.6 mg Subcutaneous Not Given 10/24/16 1619)  multivitamin with minerals tablet 1 tablet (1 tablet Oral Not Given 10/24/16 1620)  insulin aspart (novoLOG) injection 0-15 Units (3 Units Subcutaneous Given 10/24/16 1743)  sodium chloride 0.9 % bolus 500 mL (0 mLs Intravenous Stopped 10/24/16 1351)     NEW OUTPATIENT MEDICATIONS STARTED DURING THIS VISIT:  None   Note:  This document was prepared using Dragon voice recognition software and may include unintentional dictation errors.  Nanda Quinton, MD Emergency Medicine   Briley Sulton, [facility], MD 10/24/16 [phone]

## 2016-10-25 ENCOUNTER — Observation Stay (HOSPITAL_COMMUNITY): Payer: Medicare Other

## 2016-10-25 DIAGNOSIS — Z823 Family history of stroke: Secondary | ICD-10-CM | POA: Diagnosis not present

## 2016-10-25 DIAGNOSIS — E119 Type 2 diabetes mellitus without complications: Secondary | ICD-10-CM | POA: Diagnosis present

## 2016-10-25 DIAGNOSIS — I350 Nonrheumatic aortic (valve) stenosis: Secondary | ICD-10-CM | POA: Diagnosis present

## 2016-10-25 DIAGNOSIS — E669 Obesity, unspecified: Secondary | ICD-10-CM | POA: Diagnosis present

## 2016-10-25 DIAGNOSIS — I10 Essential (primary) hypertension: Secondary | ICD-10-CM | POA: Diagnosis not present

## 2016-10-25 DIAGNOSIS — H409 Unspecified glaucoma: Secondary | ICD-10-CM | POA: Diagnosis present

## 2016-10-25 DIAGNOSIS — E6609 Other obesity due to excess calories: Secondary | ICD-10-CM | POA: Diagnosis not present

## 2016-10-25 DIAGNOSIS — I5022 Chronic systolic (congestive) heart failure: Secondary | ICD-10-CM | POA: Diagnosis present

## 2016-10-25 DIAGNOSIS — Z6836 Body mass index (BMI) 36.0-36.9, adult: Secondary | ICD-10-CM | POA: Diagnosis not present

## 2016-10-25 DIAGNOSIS — R55 Syncope and collapse: Secondary | ICD-10-CM | POA: Diagnosis present

## 2016-10-25 DIAGNOSIS — D638 Anemia in other chronic diseases classified elsewhere: Secondary | ICD-10-CM | POA: Diagnosis present

## 2016-10-25 DIAGNOSIS — I959 Hypotension, unspecified: Secondary | ICD-10-CM | POA: Diagnosis present

## 2016-10-25 DIAGNOSIS — R7989 Other specified abnormal findings of blood chemistry: Secondary | ICD-10-CM | POA: Diagnosis present

## 2016-10-25 DIAGNOSIS — E785 Hyperlipidemia, unspecified: Secondary | ICD-10-CM | POA: Diagnosis present

## 2016-10-25 DIAGNOSIS — E871 Hypo-osmolality and hyponatremia: Secondary | ICD-10-CM | POA: Diagnosis present

## 2016-10-25 DIAGNOSIS — E860 Dehydration: Secondary | ICD-10-CM | POA: Diagnosis present

## 2016-10-25 DIAGNOSIS — J449 Chronic obstructive pulmonary disease, unspecified: Secondary | ICD-10-CM | POA: Diagnosis present

## 2016-10-25 DIAGNOSIS — I110 Hypertensive heart disease with heart failure: Secondary | ICD-10-CM | POA: Diagnosis present

## 2016-10-25 LAB — GLUCOSE, CAPILLARY
GLUCOSE-CAPILLARY: 133 mg/dL — AB (ref 65–99)
GLUCOSE-CAPILLARY: 133 mg/dL — AB (ref 65–99)
Glucose-Capillary: 119 mg/dL — ABNORMAL HIGH (ref 65–99)
Glucose-Capillary: 141 mg/dL — ABNORMAL HIGH (ref 65–99)

## 2016-10-25 LAB — BASIC METABOLIC PANEL
Anion gap: 9 (ref 5–15)
BUN: 41 mg/dL — ABNORMAL HIGH (ref 6–20)
CALCIUM: 9.2 mg/dL (ref 8.9–10.3)
CO2: 29 mmol/L (ref 22–32)
CREATININE: 1.29 mg/dL — AB (ref 0.44–1.00)
Chloride: 93 mmol/L — ABNORMAL LOW (ref 101–111)
GFR, EST AFRICAN AMERICAN: 43 mL/min — AB (ref >60)
GFR, EST NON AFRICAN AMERICAN: 37 mL/min — AB (ref >60)
GLUCOSE: 142 mg/dL — AB (ref 65–99)
Potassium: 4.4 mmol/L (ref 3.5–5.1)
Sodium: 131 mmol/L — ABNORMAL LOW (ref 135–145)

## 2016-10-25 LAB — CBC
HCT: 31.2 % — ABNORMAL LOW (ref 36.0–46.0)
HEMOGLOBIN: 9.8 g/dL — AB (ref 12.0–15.0)
MCH: 27.8 pg (ref 26.0–34.0)
MCHC: 31.4 g/dL (ref 30.0–36.0)
MCV: 88.4 fL (ref 78.0–100.0)
PLATELETS: DECREASED 10*3/uL (ref 150–400)
RBC: 3.53 MIL/uL — ABNORMAL LOW (ref 3.87–5.11)
RDW: 15 % (ref 11.5–15.5)
WBC: 7.5 10*3/uL (ref 4.0–10.5)

## 2016-10-25 LAB — HEMOGLOBIN A1C
HEMOGLOBIN A1C: 6.7 % — AB (ref 4.8–5.6)
MEAN PLASMA GLUCOSE: 146 mg/dL

## 2016-10-25 MED ORDER — SODIUM CHLORIDE 0.9 % IV SOLN
INTRAVENOUS | Status: DC
  Administered 2016-10-25 – 2016-10-26 (×2): via INTRAVENOUS

## 2016-10-25 NOTE — Progress Notes (Signed)
  Echocardiogram 2D Echocardiogram has been performed.  [name] 10/25/2016, 3:54 PM

## 2016-10-25 NOTE — Progress Notes (Addendum)
Ref: [name], MD   Subjective:  Feeling better. No chest pain. Improving renal function with hydration.  Objective:  Vital Signs in the last 24 hours: Temp:  [97.7 F (36.5 C)-98.3 F (36.8 C)] 98.1 F (36.7 C) (07/01 0516) Pulse Rate:  [70-89] 74 (07/01 0516) Cardiac Rhythm: Normal sinus rhythm (07/01 0705) Resp:  [12-20] 18 (07/01 0516) BP: (95-125)/(43-56) 114/48 (07/01 0516) SpO2:  [95 %-99 %] 99 % (07/01 0516) Weight:  [93.2 kg (205 lb 6.4 oz)-96.2 kg (212 lb)] 93.2 kg (205 lb 6.4 oz) (07/01 0300)  Physical Exam: BP Readings from Last 1 Encounters:  10/25/16 (!) 114/48    Wt Readings from Last 1 Encounters:  10/25/16 93.2 kg (205 lb 6.4 oz)    Weight change:  Body mass index is 35.26 kg/m. HEENT: [place]/AT, Eyes-Blue, PERL, EOMI, Conjunctiva-Pink, Sclera-Non-icteric Neck: No JVD, No bruit, Trachea midline. Lungs:  Clear, Bilateral. Cardiac:  Regular rhythm, normal S1 and S2, no S3. II/VI systolic murmur. Abdomen:  Soft, non-tender. BS present. Extremities:  No edema present. No cyanosis. No clubbing. CNS: AxOx3, Cranial nerves grossly intact, moves all 4 extremities.  Skin: Warm and dry.   Intake/Output from previous day: 06/30 0701 - 07/01 0700 In: 960 [P.O.:460; IV Piggyback:500] Out: 751 [Urine:750; Stool:1]    Lab Results: BMET    Component Value Date/Time   NA 131 (L) 10/25/2016 0309   NA 131 (L) 10/24/2016 1239   NA 133 (L) 12/05/2010 0743   K 4.4 10/25/2016 0309   K 4.8 10/24/2016 1239   K 4.6 12/05/2010 0743   CL 93 (L) 10/25/2016 0309   CL 93 (L) 10/24/2016 1239   CL 98 12/05/2010 0743   CO2 29 10/25/2016 0309   CO2 29 10/24/2016 1239   CO2 30 12/05/2010 0730   GLUCOSE 142 (H) 10/25/2016 0309   GLUCOSE 139 (H) 10/24/2016 1239   GLUCOSE 162 (H) 12/05/2010 0743   BUN 41 (H) 10/25/2016 0309   BUN 43 (H) 10/24/2016 1239   BUN 24 (H) 12/05/2010 0743   CREATININE 1.29 (H) 10/25/2016 0309   CREATININE 1.46 (H) 10/24/2016 1239   CREATININE 1.20  (H) 12/05/2010 0743   CALCIUM 9.2 10/25/2016 0309   CALCIUM 9.3 10/24/2016 1239   CALCIUM 9.5 12/05/2010 0730   GFRNONAA 37 (L) 10/25/2016 0309   GFRNONAA 32 (L) 10/24/2016 1239   GFRNONAA 49 (L) 12/05/2010 0730   GFRAA 43 (L) 10/25/2016 0309   GFRAA 37 (L) 10/24/2016 1239   GFRAA 59 (L) 12/05/2010 0730   CBC    Component Value Date/Time   WBC 12.0 (H) 10/24/2016 1239   RBC 4.07 10/24/2016 1239   HGB 11.6 (L) 10/24/2016 1239   HCT 35.8 (L) 10/24/2016 1239   PLT 135 (L) 10/24/2016 1239   MCV 88.0 10/24/2016 1239   MCH 28.5 10/24/2016 1239   MCHC 32.4 10/24/2016 1239   RDW 15.0 10/24/2016 1239   LYMPHSABS 1.3 10/24/2016 1239   MONOABS 1.1 (H) 10/24/2016 1239   EOSABS 0.2 10/24/2016 1239   BASOSABS 0.0 10/24/2016 1239   HEPATIC Function Panel  Recent Labs  10/24/16 1239  PROT 6.2*   HEMOGLOBIN A1C No components found for: HGA1C,  MPG CARDIAC ENZYMES Lab Results  Component Value Date   CKTOTAL 100 12/06/2010   CKMB 2.7 12/06/2010   TROPONINI <0.30 12/06/2010   TROPONINI <0.30 12/05/2010   TROPONINI <0.30 12/05/2010   BNP No results for input(s): PROBNP in the last 8760 hours. TSH No results for input(s):  TSH in the last 8760 hours. CHOLESTEROL No results for input(s): CHOL in the last 8760 hours.  Scheduled Meds: . aspirin EC  81 mg Oral Daily  . heparin  5,000 Units Subcutaneous Q8H  . insulin aspart  0-15 Units Subcutaneous TID WC  . latanoprost  1 drop Both Eyes QHS  . liraglutide  0.6 mg Subcutaneous Daily  . multivitamin with minerals  1 tablet Oral Daily   Continuous Infusions: . sodium chloride     PRN Meds:.  Assessment/Plan: Syncope Dehydration H/O HTN DM, II Obesity Chronic left heart systolic failure COPD Pre-renal azotemia - improving  Continue medical treatment and IV fluids. Echocardiogram for LV function.   LOS: 0 days    [name]  MD  10/25/2016, 7:50 AM    Feeling better.

## 2016-10-26 LAB — GLUCOSE, CAPILLARY
GLUCOSE-CAPILLARY: 118 mg/dL — AB (ref 65–99)
GLUCOSE-CAPILLARY: 116 mg/dL — AB (ref 65–99)
GLUCOSE-CAPILLARY: 150 mg/dL — AB (ref 65–99)
Glucose-Capillary: 138 mg/dL — ABNORMAL HIGH (ref 65–99)

## 2016-10-26 LAB — IRON AND TIBC
Iron: 65 ug/dL (ref 28–170)
Saturation Ratios: 21 % (ref 10.4–31.8)
TIBC: 311 ug/dL (ref 250–450)
UIBC: 246 ug/dL

## 2016-10-26 LAB — ECHOCARDIOGRAM COMPLETE
HEIGHTINCHES: 64 in
WEIGHTICAEL: 3286.4 [oz_av]

## 2016-10-26 LAB — CBC
HEMATOCRIT: 36.2 % (ref 36.0–46.0)
Hemoglobin: 11.2 g/dL — ABNORMAL LOW (ref 12.0–15.0)
MCH: 27.8 pg (ref 26.0–34.0)
MCHC: 30.9 g/dL (ref 30.0–36.0)
MCV: 89.8 fL (ref 78.0–100.0)
RBC: 4.03 MIL/uL (ref 3.87–5.11)
RDW: 15 % (ref 11.5–15.5)
WBC: 8.8 10*3/uL (ref 4.0–10.5)

## 2016-10-26 LAB — LIPID PANEL
CHOL/HDL RATIO: 3.9 ratio
CHOLESTEROL: 131 mg/dL (ref 0–200)
HDL: 34 mg/dL — AB (ref >40)
LDL Cholesterol: 74 mg/dL (ref 0–99)
TRIGLYCERIDES: 117 mg/dL (ref <150)
VLDL: 23 mg/dL (ref 0–40)

## 2016-10-26 LAB — BASIC METABOLIC PANEL
ANION GAP: 9 (ref 5–15)
BUN: 31 mg/dL — ABNORMAL HIGH (ref 6–20)
CHLORIDE: 100 mmol/L — AB (ref 101–111)
CO2: 26 mmol/L (ref 22–32)
Calcium: 9.3 mg/dL (ref 8.9–10.3)
Creatinine, Ser: 1.18 mg/dL — ABNORMAL HIGH (ref 0.44–1.00)
GFR, EST AFRICAN AMERICAN: 48 mL/min — AB (ref >60)
GFR, EST NON AFRICAN AMERICAN: 41 mL/min — AB (ref >60)
Glucose, Bld: 166 mg/dL — ABNORMAL HIGH (ref 65–99)
POTASSIUM: 4.7 mmol/L (ref 3.5–5.1)
SODIUM: 135 mmol/L (ref 135–145)

## 2016-10-26 LAB — FERRITIN: FERRITIN: 72 ng/mL (ref 11–307)

## 2016-10-26 NOTE — Progress Notes (Signed)
Ref: Teresa Gravel, MD   Subjective:  Feeling better. No known history of GI bleed but Hgb down to 9.8 and back up to 11.2. Creatinine down to 1.18. Very good lipid levels except HDL low at 34 mg. Normal Iron level and ferritin Good LV systolic function with mild LVH, diastolic dysfunction and mild aortic stenosis.  Objective:  Vital Signs in the last 24 hours: Temp:  [97.6 F (36.4 C)-98 F (36.7 C)] 97.6 F (36.4 C) (07/02 0426) Pulse Rate:  [80] 80 (07/02 0426) Cardiac Rhythm: Normal sinus rhythm;Heart block (07/02 0810) Resp:  [18-19] 18 (07/02 0426) BP: (115-129)/(41-81) 129/43 (07/02 0426) SpO2:  [93 %-99 %] 99 % (07/02 0426) Weight:  [94.5 kg (208 lb 6.4 oz)] 94.5 kg (208 lb 6.4 oz) (07/02 0230)  Physical Exam: BP Readings from Last 1 Encounters:  10/26/16 (!) 129/43    Wt Readings from Last 1 Encounters:  10/26/16 94.5 kg (208 lb 6.4 oz)    Weight change: -1.633 kg (-3 lb 9.6 oz) Body mass index is 35.77 kg/m. HEENT: [place]/AT, Eyes-Blue, PERL, EOMI, Conjunctiva-Pink, Sclera-Non-icteric Neck: No JVD, No bruit, Trachea midline. Lungs:  Clear, Bilateral. Cardiac:  Regular rhythm, normal S1 and S2, no S3. III/VI systolic murmur. Abdomen:  Soft, non-tender. BS present. Extremities:  No edema present. No cyanosis. No clubbing. CNS: AxOx3, Cranial nerves grossly intact, moves all 4 extremities.  Skin: Warm and dry.   Intake/Output from previous day: 07/01 0701 - 07/02 0700 In: 1577.5 [P.O.:480; I.V.:1097.5] Out: 1250 [Urine:1250]    Lab Results: BMET    Component Value Date/Time   NA 135 10/26/2016 0912   NA 131 (L) 10/25/2016 0309   NA 131 (L) 10/24/2016 1239   K 4.7 10/26/2016 0912   K 4.4 10/25/2016 0309   K 4.8 10/24/2016 1239   CL 100 (L) 10/26/2016 0912   CL 93 (L) 10/25/2016 0309   CL 93 (L) 10/24/2016 1239   CO2 26 10/26/2016 0912   CO2 29 10/25/2016 0309   CO2 29 10/24/2016 1239   GLUCOSE 166 (H) 10/26/2016 0912   GLUCOSE 142 (H) 10/25/2016 0309    GLUCOSE 139 (H) 10/24/2016 1239   BUN 31 (H) 10/26/2016 0912   BUN 41 (H) 10/25/2016 0309   BUN 43 (H) 10/24/2016 1239   CREATININE 1.18 (H) 10/26/2016 0912   CREATININE 1.29 (H) 10/25/2016 0309   CREATININE 1.46 (H) 10/24/2016 1239   CALCIUM 9.3 10/26/2016 0912   CALCIUM 9.2 10/25/2016 0309   CALCIUM 9.3 10/24/2016 1239   GFRNONAA 41 (L) 10/26/2016 0912   GFRNONAA 37 (L) 10/25/2016 0309   GFRNONAA 32 (L) 10/24/2016 1239   GFRAA 48 (L) 10/26/2016 0912   GFRAA 43 (L) 10/25/2016 0309   GFRAA 37 (L) 10/24/2016 1239   CBC    Component Value Date/Time   WBC 8.8 10/26/2016 0912   RBC 4.03 10/26/2016 0912   HGB 11.2 (L) 10/26/2016 0912   HCT 36.2 10/26/2016 0912   PLT  10/26/2016 0912    PLATELET CLUMPING, SUGGEST RECOLLECTION OF SAMPLE IN CITRATE TUBE.   MCV 89.8 10/26/2016 0912   MCH 27.8 10/26/2016 0912   MCHC 30.9 10/26/2016 0912   RDW 15.0 10/26/2016 0912   LYMPHSABS 1.3 10/24/2016 1239   MONOABS 1.1 (H) 10/24/2016 1239   EOSABS 0.2 10/24/2016 1239   BASOSABS 0.0 10/24/2016 1239   HEPATIC Function Panel  Recent Labs  10/24/16 1239  PROT 6.2*   HEMOGLOBIN A1C No components found for: HGA1C,  MPG CARDIAC  ENZYMES Lab Results  Component Value Date   CKTOTAL 100 12/06/2010   CKMB 2.7 12/06/2010   TROPONINI <0.30 12/06/2010   TROPONINI <0.30 12/05/2010   TROPONINI <0.30 12/05/2010   BNP No results for input(s): PROBNP in the last 8760 hours. TSH No results for input(s): TSH in the last 8760 hours. CHOLESTEROL  Recent Labs  10/26/16 0912  CHOL 131    Scheduled Meds: . aspirin EC  81 mg Oral Daily  . heparin  5,000 Units Subcutaneous Q8H  . insulin aspart  0-15 Units Subcutaneous TID WC  . latanoprost  1 drop Both Eyes QHS  . multivitamin with minerals  1 tablet Oral Daily   Continuous Infusions: . sodium chloride 25 mL/hr at 10/26/16 0825   PRN Meds:.  Assessment/Plan: Syncope Dehydration - resolved H/O HTN Diabetes mellitus type  2 Obesity Chronic left heart diastolic failure Chronic obstructive lung disease Prerenal azotemia- improving Anemia of chronic disease rule out iron deficiency and GI blood loss  Decrease IV fluids increase activity. Check serum iron level-normal, and stool for occult blood-pending. Patient aware of her aortic stenosis and medical treatment for now. Patient to use cane or walker all the time. Home soon. F/U Dr. Nadyne Coombes.   LOS: 1 day    [name]  MD  10/26/2016, 12:17 PM

## 2016-10-27 LAB — GLUCOSE, CAPILLARY
GLUCOSE-CAPILLARY: 111 mg/dL — AB (ref 65–99)
Glucose-Capillary: 113 mg/dL — ABNORMAL HIGH (ref 65–99)

## 2016-10-27 MED ORDER — ISOSORBIDE MONONITRATE ER 60 MG PO TB24: 60.0000 mg | ORAL_TABLET | Freq: Every day | ORAL | 1 refills | Status: DC

## 2016-10-27 MED ORDER — ISOSORBIDE MONONITRATE ER 60 MG PO TB24
60.0000 mg | ORAL_TABLET | Freq: Every day | ORAL | Status: DC
  Administered 2016-10-27: 60 mg via ORAL
  Filled 2016-10-27: qty 1

## 2016-10-27 MED ORDER — METFORMIN HCL 1000 MG PO TABS: 500.0000 mg | ORAL_TABLET | Freq: Every day | ORAL | Status: DC

## 2016-10-27 MED ORDER — GLIPIZIDE 2.5 MG HALF TABLET
2.5000 mg | ORAL_TABLET | Freq: Every day | ORAL | Status: DC
  Administered 2016-10-27: 2.5 mg via ORAL
  Filled 2016-10-27: qty 1

## 2016-10-27 MED ORDER — PIOGLITAZONE HCL 15 MG PO TABS
15.0000 mg | ORAL_TABLET | Freq: Every day | ORAL | Status: DC
  Administered 2016-10-27: 15 mg via ORAL
  Filled 2016-10-27: qty 1

## 2016-10-27 MED ORDER — METFORMIN HCL 500 MG PO TABS: 500.0000 mg | ORAL_TABLET | Freq: Two times a day (BID) | ORAL | Status: DC

## 2016-10-27 MED ORDER — IRBESARTAN 75 MG PO TABS
75.0000 mg | ORAL_TABLET | Freq: Every day | ORAL | Status: DC
Start: 2016-10-27 — End: 2016-10-27
  Administered 2016-10-27: 75 mg via ORAL
  Filled 2016-10-27: qty 1

## 2016-10-27 MED ORDER — GLIPIZIDE 5 MG PO TABS: 5.0000 mg | ORAL_TABLET | Freq: Every day | ORAL | Status: DC

## 2016-10-27 MED ORDER — GLIPIZIDE 5 MG PO TABS: 2.5000 mg | ORAL_TABLET | Freq: Every day | ORAL | Status: DC

## 2016-10-27 NOTE — Progress Notes (Signed)
Pt has orders to be discharged. Discharge instructions given and pt has no additional questions at this time. Medication regimen reviewed and pt educated. Pt verbalized understanding and has no additional questions. Telemetry box removed. IV removed and site in good condition. Pt stable and waiting for transportation. 

## 2016-10-27 NOTE — Discharge Summary (Signed)
Physician Discharge Summary  Patient ID: [name] MRN: [id number] DOB/AGE: 01/18/1931 83 y.o.  Admit date: 10/24/2016 Discharge date: 10/27/2016  Admission Diagnoses: Syncope Dehydration History of hypertension Type 2 diabetes mellitus Obesity Chronic left heart systolic failure Chronic obstructive lung disease Prerenal as it anemia Anemia of chronic disease  Discharge Diagnoses:  Principal problem: * Syncope due to dehydration * Active Problems:   Hypertension   Type 2 diabetes mellitus   Obesity   Chronic left heart diastolic failure   Chronic obstructive lung disease   Prerenal azotemia-resolved   Anemia of chronic disease, iron deficiency ruled out   Mild aortic stenosis   Hyponatremia, resolved   Dyslipidemia  Discharged Condition: fair  Hospital Course: 83 year old female who was started on Lasix for leg edema had a past medical history of congestive heart failure chronic obstructive lung disease type 2 diabetes mellitus hyperlipidemia and hypertension she had a syncopal episode with weakness and confusion after 500 mL of fluid  resuscitation in the emergency room she felt better. On echocardiogram her left ventricular systolic function had significant improvement and she had mild left ventricular hypertrophy with mild diastolic dysfunction and mild aortic stenosis. Her renal function improved with IV hydration.  She was able to ambulate well. Her Lasix and hydrochlorothiazide were discontinued to improve cardiac out put. and most of her antihypertensive medications and diabetic medications were decreased by 50%.  She was discharged home in satisfactory condition with follow-up by primary care in 2 weeks and by Dr. Einar Gip in the 2-3 weeks.  Consults: cardiology  Significant Diagnostic Studies: labs: CBC near normal with a slightly low hemoglobin of 11.6 and platelet count of 135,000. Beam 8 showed low sodium of 131 BUN of 43 and creatinine of 1.46 post hydration BUN  was down to 31 and creatinine was 1.18. Serum iron level, TIBC and saturation ratios were normal. Serum ferritin was also normal.  EKG showed a sinus rhythm with left anterior hemiblock.  Chest x-ray was unremarkable.  Echocardiogram: Mild LVH with improved systolic function and mild diastolic dysfunction. Mild AS.  Treatments: cardiac meds: Aspirin, telmisartan and isosorbide mononitrate.  Discharge Exam: Blood pressure (!) 142/49, pulse 74, temperature 98 F (36.7 C), temperature source Oral, resp. rate 18, height 5\' 4"  (1.626 m), weight 94.8 kg (209 lb), SpO2 98 %. General appearance: alert, cooperative and appears stated age.  Head: Normocephalic, atraumatic. Eyes: Blue eyes, pink conjunctiva, corneas clear. PERRL, EOM's intact.  Neck: No adenopathy, no carotid bruit, no JVD, supple, symmetrical, trachea midline and thyroid not enlarged. Resp: Clear to auscultation bilaterally. Cardio: Regular rate and rhythm, S1, S2 normal, II/VI systolic murmur, no click, rub or gallop. GI: Soft, non-tender; bowel sounds normal; no organomegaly. Extremities: No edema, cyanosis or clubbing. Skin: Warm and dry.  Neurologic: Alert and oriented X 3, normal strength and tone. Normal coordination and slow gait.  Disposition: 01-Home or Self Care   Allergies as of 10/27/2016   No Known Allergies     Medication List    STOP taking these medications   bimatoprost 0.03 % ophthalmic solution Commonly known as:  LUMIGAN   furosemide 20 MG tablet Commonly known as:  LASIX   hydrochlorothiazide 25 MG tablet Commonly known as:  HYDRODIURIL   HYDROcodone-acetaminophen 5-325 MG tablet Commonly known as:  NORCO/VICODIN     TAKE these medications   acetaminophen 650 MG CR tablet Commonly known as:  TYLENOL Take 650 mg by mouth every 8 (eight) hours as needed for pain.  ACTOS 30 MG tablet Generic drug:  pioglitazone Take 15 mg by mouth daily.   aspirin EC 81 MG tablet Take 81 mg by mouth  daily.   glipiZIDE 5 MG tablet Commonly known as:  GLUCOTROL Take 0.5 tablets (2.5 mg total) by mouth daily. What changed:  how much to take   glucose blood test strip 1 each by Other route as needed for other. Use as instructed   isosorbide mononitrate 60 MG 24 hr tablet Commonly known as:  IMDUR Take 1 tablet (60 mg total) by mouth daily. What changed:  medication strength  how much to take   latanoprost 0.005 % ophthalmic solution Commonly known as:  XALATAN Place 1 drop into both eyes at bedtime.   metFORMIN 1000 MG tablet Commonly known as:  GLUCOPHAGE Take 0.5 tablets (500 mg total) by mouth daily. What changed:  how much to take   miconazole 2 % cream Commonly known as:  MICATIN Apply 1 application topically 2 (two) times daily.   multivitamin with minerals tablet Take 1 tablet by mouth daily.   mupirocin ointment 2 % Commonly known as:  BACTROBAN Apply 1 application topically 3 (three) times daily as needed.   telmisartan 80 MG tablet Commonly known as:  MICARDIS Take 80 mg by mouth daily.   VICTOZA [place] Inject 0.6 mg into the skin daily.      Follow-up Information    [name], MD. Schedule an appointment as soon as possible for a visit in 2 week(s).   Specialty:  Internal Medicine Contact information: [address] [place] [address] [phone]        Adrian Prows, MD. Schedule an appointment as soon as possible for a visit in 1 month(s).   Specialty:  Cardiology Contact information: [address] [place]  [address] [phone]           Signed: Birdie Riddle 10/27/2016, 9:15 AM

## 2016-10-27 NOTE — Progress Notes (Signed)
Pt is getting d/c and she will take a shower when she gets home

## 2016-11-05 DIAGNOSIS — E1165 Type 2 diabetes mellitus with hyperglycemia: Secondary | ICD-10-CM | POA: Diagnosis not present

## 2016-11-05 DIAGNOSIS — E119 Type 2 diabetes mellitus without complications: Secondary | ICD-10-CM | POA: Diagnosis not present

## 2016-11-05 DIAGNOSIS — I10 Essential (primary) hypertension: Secondary | ICD-10-CM | POA: Diagnosis not present

## 2016-11-09 DIAGNOSIS — M545 Low back pain: Secondary | ICD-10-CM | POA: Diagnosis not present

## 2016-11-09 DIAGNOSIS — M5136 Other intervertebral disc degeneration, lumbar region: Secondary | ICD-10-CM | POA: Diagnosis not present

## 2016-11-09 DIAGNOSIS — I10 Essential (primary) hypertension: Secondary | ICD-10-CM | POA: Diagnosis not present

## 2016-11-09 DIAGNOSIS — Z6836 Body mass index (BMI) 36.0-36.9, adult: Secondary | ICD-10-CM | POA: Diagnosis not present

## 2016-11-13 DIAGNOSIS — R0602 Shortness of breath: Secondary | ICD-10-CM | POA: Diagnosis not present

## 2016-11-13 DIAGNOSIS — I10 Essential (primary) hypertension: Secondary | ICD-10-CM | POA: Diagnosis not present

## 2016-11-13 DIAGNOSIS — I5032 Chronic diastolic (congestive) heart failure: Secondary | ICD-10-CM | POA: Diagnosis not present

## 2016-11-13 DIAGNOSIS — I350 Nonrheumatic aortic (valve) stenosis: Secondary | ICD-10-CM | POA: Diagnosis not present

## 2016-11-25 DIAGNOSIS — H401131 Primary open-angle glaucoma, bilateral, mild stage: Secondary | ICD-10-CM | POA: Diagnosis not present

## 2016-11-25 DIAGNOSIS — H2513 Age-related nuclear cataract, bilateral: Secondary | ICD-10-CM | POA: Diagnosis not present

## 2016-11-30 DIAGNOSIS — E119 Type 2 diabetes mellitus without complications: Secondary | ICD-10-CM | POA: Diagnosis not present

## 2016-11-30 DIAGNOSIS — I10 Essential (primary) hypertension: Secondary | ICD-10-CM | POA: Diagnosis not present

## 2016-12-08 DIAGNOSIS — R0989 Other specified symptoms and signs involving the circulatory and respiratory systems: Secondary | ICD-10-CM | POA: Diagnosis not present

## 2017-02-23 DIAGNOSIS — E119 Type 2 diabetes mellitus without complications: Secondary | ICD-10-CM | POA: Diagnosis not present

## 2017-02-23 DIAGNOSIS — I10 Essential (primary) hypertension: Secondary | ICD-10-CM | POA: Diagnosis not present

## 2017-03-01 DIAGNOSIS — X32XXXD Exposure to sunlight, subsequent encounter: Secondary | ICD-10-CM | POA: Diagnosis not present

## 2017-03-01 DIAGNOSIS — D225 Melanocytic nevi of trunk: Secondary | ICD-10-CM | POA: Diagnosis not present

## 2017-03-01 DIAGNOSIS — Z08 Encounter for follow-up examination after completed treatment for malignant neoplasm: Secondary | ICD-10-CM | POA: Diagnosis not present

## 2017-03-01 DIAGNOSIS — I10 Essential (primary) hypertension: Secondary | ICD-10-CM | POA: Diagnosis not present

## 2017-03-01 DIAGNOSIS — Z85828 Personal history of other malignant neoplasm of skin: Secondary | ICD-10-CM | POA: Diagnosis not present

## 2017-03-01 DIAGNOSIS — E119 Type 2 diabetes mellitus without complications: Secondary | ICD-10-CM | POA: Diagnosis not present

## 2017-03-01 DIAGNOSIS — I509 Heart failure, unspecified: Secondary | ICD-10-CM | POA: Diagnosis not present

## 2017-03-01 DIAGNOSIS — L820 Inflamed seborrheic keratosis: Secondary | ICD-10-CM | POA: Diagnosis not present

## 2017-03-01 DIAGNOSIS — L570 Actinic keratosis: Secondary | ICD-10-CM | POA: Diagnosis not present

## 2017-03-19 ENCOUNTER — Inpatient Hospital Stay (HOSPITAL_COMMUNITY)
Admission: EM | Admit: 2017-03-19 | Discharge: 2017-03-21 | DRG: 312 | Disposition: A | Discharge status: Home / Self Care | Payer: Medicare Other | Attending: Internal Medicine | Admitting: Internal Medicine

## 2017-03-19 ENCOUNTER — Emergency Department (HOSPITAL_COMMUNITY): Payer: Medicare Other

## 2017-03-19 ENCOUNTER — Other Ambulatory Visit: Payer: Self-pay

## 2017-03-19 ENCOUNTER — Encounter (HOSPITAL_COMMUNITY): Payer: Self-pay

## 2017-03-19 DIAGNOSIS — N183 Chronic kidney disease, stage 3 unspecified: Secondary | ICD-10-CM

## 2017-03-19 DIAGNOSIS — Z7984 Long term (current) use of oral hypoglycemic drugs: Secondary | ICD-10-CM

## 2017-03-19 DIAGNOSIS — E119 Type 2 diabetes mellitus without complications: Secondary | ICD-10-CM | POA: Diagnosis not present

## 2017-03-19 DIAGNOSIS — E1122 Type 2 diabetes mellitus with diabetic chronic kidney disease: Secondary | ICD-10-CM | POA: Diagnosis not present

## 2017-03-19 DIAGNOSIS — E785 Hyperlipidemia, unspecified: Secondary | ICD-10-CM | POA: Diagnosis present

## 2017-03-19 DIAGNOSIS — E1159 Type 2 diabetes mellitus with other circulatory complications: Secondary | ICD-10-CM

## 2017-03-19 DIAGNOSIS — R197 Diarrhea, unspecified: Secondary | ICD-10-CM

## 2017-03-19 DIAGNOSIS — I10 Essential (primary) hypertension: Secondary | ICD-10-CM | POA: Diagnosis not present

## 2017-03-19 DIAGNOSIS — I272 Pulmonary hypertension, unspecified: Secondary | ICD-10-CM | POA: Diagnosis present

## 2017-03-19 DIAGNOSIS — Z6835 Body mass index (BMI) 35.0-35.9, adult: Secondary | ICD-10-CM | POA: Diagnosis not present

## 2017-03-19 DIAGNOSIS — E860 Dehydration: Secondary | ICD-10-CM

## 2017-03-19 DIAGNOSIS — R112 Nausea with vomiting, unspecified: Secondary | ICD-10-CM | POA: Diagnosis not present

## 2017-03-19 DIAGNOSIS — Z79899 Other long term (current) drug therapy: Secondary | ICD-10-CM

## 2017-03-19 DIAGNOSIS — R05 Cough: Secondary | ICD-10-CM | POA: Diagnosis not present

## 2017-03-19 DIAGNOSIS — Z7982 Long term (current) use of aspirin: Secondary | ICD-10-CM

## 2017-03-19 DIAGNOSIS — E669 Obesity, unspecified: Secondary | ICD-10-CM | POA: Diagnosis present

## 2017-03-19 DIAGNOSIS — H409 Unspecified glaucoma: Secondary | ICD-10-CM | POA: Diagnosis present

## 2017-03-19 DIAGNOSIS — R55 Syncope and collapse: Secondary | ICD-10-CM | POA: Diagnosis not present

## 2017-03-19 DIAGNOSIS — R531 Weakness: Secondary | ICD-10-CM | POA: Diagnosis not present

## 2017-03-19 DIAGNOSIS — I130 Hypertensive heart and chronic kidney disease with heart failure and stage 1 through stage 4 chronic kidney disease, or unspecified chronic kidney disease: Secondary | ICD-10-CM | POA: Diagnosis present

## 2017-03-19 DIAGNOSIS — I5032 Chronic diastolic (congestive) heart failure: Secondary | ICD-10-CM | POA: Diagnosis present

## 2017-03-19 DIAGNOSIS — E1169 Type 2 diabetes mellitus with other specified complication: Secondary | ICD-10-CM

## 2017-03-19 DIAGNOSIS — R404 Transient alteration of awareness: Secondary | ICD-10-CM | POA: Diagnosis not present

## 2017-03-19 DIAGNOSIS — J449 Chronic obstructive pulmonary disease, unspecified: Secondary | ICD-10-CM | POA: Diagnosis present

## 2017-03-19 DIAGNOSIS — R1110 Vomiting, unspecified: Secondary | ICD-10-CM

## 2017-03-19 LAB — COMPREHENSIVE METABOLIC PANEL
ALBUMIN: 3.7 g/dL (ref 3.5–5.0)
ALK PHOS: 71 U/L (ref 38–126)
ALT: 14 U/L (ref 14–54)
AST: 20 U/L (ref 15–41)
Anion gap: 9 (ref 5–15)
BILIRUBIN TOTAL: 0.9 mg/dL (ref 0.3–1.2)
BUN: 38 mg/dL — AB (ref 6–20)
CALCIUM: 8.7 mg/dL — AB (ref 8.9–10.3)
CO2: 24 mmol/L (ref 22–32)
Chloride: 104 mmol/L (ref 101–111)
Creatinine, Ser: 1.04 mg/dL — ABNORMAL HIGH (ref 0.44–1.00)
GFR calc Af Amer: 56 mL/min — ABNORMAL LOW (ref >60)
GFR calc non Af Amer: 48 mL/min — ABNORMAL LOW (ref >60)
GLUCOSE: 153 mg/dL — AB (ref 65–99)
POTASSIUM: 4.6 mmol/L (ref 3.5–5.1)
SODIUM: 137 mmol/L (ref 135–145)
TOTAL PROTEIN: 6.8 g/dL (ref 6.5–8.1)

## 2017-03-19 LAB — CBC WITH DIFFERENTIAL/PLATELET
BAND NEUTROPHILS: 0 %
BASOS ABS: 0 10*3/uL (ref 0.0–0.1)
BLASTS: 0 %
Basophils Relative: 0 %
EOS ABS: 0.1 10*3/uL (ref 0.0–0.7)
Eosinophils Relative: 1 %
HEMATOCRIT: 37 % (ref 36.0–46.0)
HEMOGLOBIN: 11.8 g/dL — AB (ref 12.0–15.0)
Lymphocytes Relative: 10 %
Lymphs Abs: 0.7 10*3/uL (ref 0.7–4.0)
MCH: 28.9 pg (ref 26.0–34.0)
MCHC: 31.9 g/dL (ref 30.0–36.0)
MCV: 90.7 fL (ref 78.0–100.0)
METAMYELOCYTES PCT: 0 %
MYELOCYTES: 0 %
Monocytes Absolute: 0.4 10*3/uL (ref 0.1–1.0)
Monocytes Relative: 6 %
Neutro Abs: 5.6 10*3/uL (ref 1.7–7.7)
Neutrophils Relative %: 83 %
Other: 0 %
PROMYELOCYTES ABS: 0 %
Platelets: DECREASED 10*3/uL (ref 150–400)
RBC: 4.08 MIL/uL (ref 3.87–5.11)
RDW: 14.1 % (ref 11.5–15.5)
WBC: 6.8 10*3/uL (ref 4.0–10.5)
nRBC: 0 /100 WBC

## 2017-03-19 LAB — I-STAT TROPONIN, ED: Troponin i, poc: 0 ng/mL (ref 0.00–0.08)

## 2017-03-19 LAB — GLUCOSE, CAPILLARY: GLUCOSE-CAPILLARY: 106 mg/dL — AB (ref 65–99)

## 2017-03-19 LAB — CBG MONITORING, ED: Glucose-Capillary: 151 mg/dL — ABNORMAL HIGH (ref 65–99)

## 2017-03-19 LAB — TROPONIN I: Troponin I: <0.03 ng/mL (ref <0.03)

## 2017-03-19 LAB — BRAIN NATRIURETIC PEPTIDE: B Natriuretic Peptide: 33.1 pg/mL (ref 0.0–100.0)

## 2017-03-19 LAB — LIPASE, BLOOD: Lipase: 31 U/L (ref 11–51)

## 2017-03-19 LAB — I-STAT CG4 LACTIC ACID, ED: Lactic Acid, Venous: 1.1 mmol/L (ref 0.5–1.9)

## 2017-03-19 MED ORDER — ENOXAPARIN SODIUM 40 MG/0.4ML ~~LOC~~ SOLN
40.0000 mg | SUBCUTANEOUS | Status: DC
  Administered 2017-03-19 – 2017-03-20 (×2): 40 mg via SUBCUTANEOUS
  Filled 2017-03-19 (×2): qty 0.4

## 2017-03-19 MED ORDER — CHLORHEXIDINE GLUCONATE 0.12 % MT SOLN
15.0000 mL | Freq: Two times a day (BID) | OROMUCOSAL | Status: DC
  Administered 2017-03-19 – 2017-03-21 (×4): 15 mL via OROMUCOSAL
  Filled 2017-03-19 (×4): qty 15

## 2017-03-19 MED ORDER — ISOSORBIDE MONONITRATE ER 30 MG PO TB24
30.0000 mg | ORAL_TABLET | Freq: Every day | ORAL | Status: DC
  Administered 2017-03-20 – 2017-03-21 (×2): 30 mg via ORAL
  Filled 2017-03-19 (×2): qty 1

## 2017-03-19 MED ORDER — SODIUM CHLORIDE 0.9 % IV BOLUS (SEPSIS)
500.0000 mL | Freq: Once | INTRAVENOUS | Status: AC
  Administered 2017-03-19: 500 mL via INTRAVENOUS

## 2017-03-19 MED ORDER — ADULT MULTIVITAMIN W/MINERALS CH
1.0000 | ORAL_TABLET | Freq: Every day | ORAL | Status: DC
  Administered 2017-03-20 – 2017-03-21 (×2): 1 via ORAL
  Filled 2017-03-19 (×2): qty 1

## 2017-03-19 MED ORDER — INSULIN ASPART 100 UNIT/ML ~~LOC~~ SOLN
0.0000 [IU] | Freq: Three times a day (TID) | SUBCUTANEOUS | Status: DC
  Administered 2017-03-20 (×2): 1 [IU] via SUBCUTANEOUS

## 2017-03-19 MED ORDER — SODIUM CHLORIDE 0.9 % IV SOLN
INTRAVENOUS | Status: DC
  Administered 2017-03-19: 17:00:00 via INTRAVENOUS

## 2017-03-19 MED ORDER — SODIUM CHLORIDE 0.9 % IV SOLN
INTRAVENOUS | Status: DC
  Administered 2017-03-19: 21:00:00 via INTRAVENOUS

## 2017-03-19 MED ORDER — ORAL CARE MOUTH RINSE
15.0000 mL | Freq: Two times a day (BID) | OROMUCOSAL | Status: DC
  Administered 2017-03-20: 15 mL via OROMUCOSAL

## 2017-03-19 NOTE — ED Notes (Signed)
Delay in lab work, unable to obtain from IV, fluids are running currently.

## 2017-03-19 NOTE — ED Notes (Signed)
ED TO INPATIENT HANDOFF REPORT  Name/Age/Gender [name] 83 y.o. female  Code Status Code Status History    Date Active Date Inactive Code Status Order ID Comments User Context   10/24/2016 14:30 10/27/2016 16:04 Full Code [id number]  [name], MD ED   07/20/2016 10:27 07/21/2016 03:25 Full Code [id number]  [name], MD [facility]      Home/SNF/Other Home  Chief Complaint hypotension/syncope  Level of Care/Admitting Diagnosis ED Disposition    ED Disposition Condition [facility] Area: [facility] [[id number]]  Level of Care: Telemetry [5]  Admit to tele based on following criteria: Eval of Syncope  Diagnosis: Syncope [267124]  Admitting Physician: [name] [phone]  Attending Physician: [name] [[id number]]  PT Class (Do Not Modify): Observation [104]  PT Acc Code (Do Not Modify): Observation [10022]       Medical History Past Medical History:  Diagnosis Date  . CHF (congestive heart failure) ([place])   . COPD (chronic obstructive pulmonary disease) ([place])   . Diabetes mellitus   . Glaucoma   . Hyperlipidemia   . Hypertension     Allergies No Known Allergies  IV Location/Drains/Wounds Patient Lines/Drains/Airways Status   Active Line/Drains/Airways    Name:   Placement date:   Placement time:   Site:   Days:   Peripheral IV 03/19/17 Left Antecubital   03/19/17    1434    Antecubital   less than 1          Labs/Imaging Results for orders placed or performed during the hospital encounter of 03/19/17 (from the past 48 hour(s))  CBG monitoring, ED     Status: Abnormal   Collection Time: 03/19/17  1:17 PM  Result Value Ref Range   Glucose-Capillary 151 (H) 65 - 99 mg/dL  CBC with Differential/Platelet     Status: Abnormal   Collection Time: 03/19/17  2:55 PM  Result Value Ref Range   WBC 6.8 4.0 - 10.5 K/uL    Comment: WHITE COUNT CONFIRMED ON SMEAR   RBC 4.08 3.87 - 5.11 MIL/uL   Hemoglobin 11.8 (L) 12.0 - 15.0  g/dL   HCT 37.0 36.0 - 46.0 %   MCV 90.7 78.0 - 100.0 fL   MCH 28.9 26.0 - 34.0 pg   MCHC 31.9 30.0 - 36.0 g/dL   RDW 14.1 11.5 - 15.5 %   Platelets  150 - 400 K/uL    PLATELET CLUMPS NOTED ON SMEAR, COUNT APPEARS DECREASED   Neutrophils Relative % 83 %   Lymphocytes Relative 10 %   Monocytes Relative 6 %   Eosinophils Relative 1 %   Basophils Relative 0 %   Band Neutrophils 0 %   Metamyelocytes Relative 0 %   Myelocytes 0 %   Promyelocytes Absolute 0 %   Blasts 0 %   nRBC 0 0 /100 WBC   Other 0 %   Neutro Abs 5.6 1.7 - 7.7 K/uL   Lymphs Abs 0.7 0.7 - 4.0 K/uL   Monocytes Absolute 0.4 0.1 - 1.0 K/uL   Eosinophils Absolute 0.1 0.0 - 0.7 K/uL   Basophils Absolute 0.0 0.0 - 0.1 K/uL   Smear Review MORPHOLOGY UNREMARKABLE   Comprehensive metabolic panel     Status: Abnormal   Collection Time: 03/19/17  2:55 PM  Result Value Ref Range   Sodium 137 135 - 145 mmol/L   Potassium 4.6 3.5 - 5.1 mmol/L   Chloride 104 101 - 111  mmol/L   CO2 24 22 - 32 mmol/L   Glucose, Bld 153 (H) 65 - 99 mg/dL   BUN 38 (H) 6 - 20 mg/dL   Creatinine, Ser 1.04 (H) 0.44 - 1.00 mg/dL   Calcium 8.7 (L) 8.9 - 10.3 mg/dL   Total Protein 6.8 6.5 - 8.1 g/dL   Albumin 3.7 3.5 - 5.0 g/dL   AST 20 15 - 41 U/L   ALT 14 14 - 54 U/L   Alkaline Phosphatase 71 38 - 126 U/L   Total Bilirubin 0.9 0.3 - 1.2 mg/dL   GFR calc non Af Amer 48 (L) >60 mL/min   GFR calc Af Amer 56 (L) >60 mL/min    Comment: (NOTE) The eGFR has been calculated using the CKD EPI equation. This calculation has not been validated in all clinical situations. eGFR's persistently <60 mL/min signify possible Chronic Kidney Disease.    Anion gap 9 5 - 15  Lipase, blood     Status: None   Collection Time: 03/19/17  2:55 PM  Result Value Ref Range   Lipase 31 11 - 51 U/L  Brain natriuretic peptide     Status: None   Collection Time: 03/19/17  2:55 PM  Result Value Ref Range   B Natriuretic Peptide 33.1 0.0 - 100.0 pg/mL  I-stat troponin,  ED     Status: None   Collection Time: 03/19/17  3:01 PM  Result Value Ref Range   Troponin i, poc 0.00 0.00 - 0.08 ng/mL   Comment 3            Comment: Due to the release kinetics of cTnI, a negative result within the first hours of the onset of symptoms does not rule out myocardial infarction with certainty. If myocardial infarction is still suspected, repeat the test at appropriate intervals.   I-Stat CG4 Lactic Acid, ED     Status: None   Collection Time: 03/19/17  3:03 PM  Result Value Ref Range   Lactic Acid, Venous 1.10 0.5 - 1.9 mmol/L   Dg Chest 2 View  Result Date: 03/19/2017 CLINICAL DATA:  Patient with cough for multiple weeks. EXAM: CHEST  2 VIEW COMPARISON:  Chest radiograph 10/24/2016 FINDINGS: Monitoring leads overlie the patient. Stable cardiac and mediastinal contours. Aortic atherosclerosis. No consolidative pulmonary opacities. No pleural effusion or pneumothorax. Thoracic spine degenerative changes. IMPRESSION: No acute cardiopulmonary process. Electronically Signed   By: Lovey Newcomer M.D.   On: 03/19/2017 14:11    Pending Labs [employer] (From admission, onward)   Start     Ordered   Signed and Held  Troponin I (q 6hr x 3)  Now then every 6 hours,   R     Signed and Held   Signed and Held  Basic metabolic panel  Tomorrow morning,   R     Signed and Held      Vitals/Pain Today's Vitals   03/19/17 1317 03/19/17 1324 03/19/17 1556 03/19/17 1716  BP:   118/61 (!) 106/59  Pulse:   77 76  Resp:   16 14  Temp:  (!) 97.2 F (36.2 C)    TempSrc:  Oral    SpO2:   94% 95%  Weight: 209 lb (94.8 kg)     Height: 5' 4"  (1.626 m)       Isolation Precautions No active isolations  Medications Medications  0.9 %  sodium chloride infusion ( Intravenous New Bag/Given 03/19/17 1723)  sodium chloride 0.9 % bolus 500  mL (0 mLs Intravenous Stopped 03/19/17 1556)    Mobility walks with device

## 2017-03-19 NOTE — ED Triage Notes (Addendum)
Pt BIB EMS from home after a syncopal episode today. EMS reports pt experienced syncopal episode yesterday, was seen by EMS, but not transported. N/V/D x2 weeks, has been taking phenergan with minimal relief. Denies fever. Hyperglycemic with EMS, hx of diabetes. Hypotensive with EMS, 500 ml bolus given en route to this facility. A/Ox4.  Pt's family states pt experienced blurry vision before she "passed out."

## 2017-03-19 NOTE — ED Provider Notes (Signed)
[facility] DEPT Provider Note   CSN: [id number] Arrival date & time: 03/19/17  1256     History   Chief Complaint Chief Complaint  Patient presents with  . Loss of Consciousness    HPI [name] is a 83 y.o. female.   Patient is an 83 year old female with a history of CHF, COPD, diabetes, hypertension, hyperlipidemia who presents today after a syncopal event.  Patient states she has not felt well for the last 2 weeks.  She had had congestion and cough and seen her doctor and given a cough medicine but she had not been getting better and then Wednesday evening she started having vomiting and diarrhea.  Yesterday she had vomiting and diarrhea all day and then this morning has had 2-3 episodes of diarrhea without vomiting.  She states she was sitting at the kitchen table and she had been sitting there may be an hour when she suddenly started having blurred vision, sweating and not feeling well.  She describes a discomfort that went through her shoulder blades and then husband states that she lost consciousness.  She had 1-5 minutes of intermittent unconsciousness but had pulse and breathing the whole time.  She was hypotensive when EMS arrived and did receive fluid in route which she states has made her feel better.  She currently denies any shortness of breath or chest discomfort.  She is not having any abdominal pain and no lower extremity swelling.  She checked her weight several days ago and states it had not changed.  She is not currently taking any type of diuretic.  She denies any recent hospitalizations or antibiotics in the last few months.  Husband states that this is a similar event is what happened in June when she had low blood pressure and passing out.    The history is provided by the patient and the spouse.   Loss of Consciousness    This is a recurrent problem. The current episode started less than 1 hour ago. The problem occurs constantly.  The problem has been resolved. She lost consciousness for a period of 1 to 5 minutes. The problem is associated with sitting up. Associated symptoms include back pain, congestion, diaphoresis and weakness. Pertinent negatives include abdominal pain, chest pain, fever and palpitations. She has tried nothing for the symptoms. The treatment provided no relief. Her past medical history is significant for DM. Past medical history comments: copd, chf.    Past Medical History:  Diagnosis Date  . CHF (congestive heart failure) ([place])   . COPD (chronic obstructive pulmonary disease) ([place])   . Diabetes mellitus   . Glaucoma   . Hyperlipidemia   . Hypertension     Patient Active Problem List   Diagnosis Date Noted  . Syncope 10/24/2016  . Pulmonary hypertension ([place]) 12/07/2015  . Varicose veins of leg with complications 40/98/1191  . Varicose veins of left lower extremity with complications 47/82/9562  . Varicose veins of lower extremities with other complications 13/11/6576  . Lipoma of back 07/19/2012    Past Surgical History:  Procedure Laterality Date  . BACK SURGERY    . CARDIAC CATHETERIZATION N/A 12/09/2015   Procedure: Right Heart Cath;  Surgeon: Adrian Prows, MD;  Location: [facility];  Service: Cardiovascular;  Laterality: N/A;  . CHOLECYSTECTOMY  2000  . HERNIA REPAIR  2007  . rupture disk  1970"s  . SHOULDER ARTHROSCOPY WITH ROTATOR CUFF REPAIR  1999   rt shoulder  OB History    No data available       Home Medications    Prior to Admission medications   Medication Sig Start Date End Date Taking? Authorizing Provider  acetaminophen (TYLENOL) 650 MG CR tablet Take 650 mg by mouth every 8 (eight) hours as needed for pain.    [provider]  aspirin EC 81 MG tablet Take 81 mg by mouth daily.    [provider]  glipiZIDE (GLUCOTROL) 5 MG tablet Take 0.5 tablets (2.5 mg total) by mouth daily. 10/27/16   [name], MD  glucose blood test strip 1  each by Other route as needed for other. Use as instructed    [provider]  isosorbide mononitrate (IMDUR) 60 MG 24 hr tablet Take 1 tablet (60 mg total) by mouth daily. 10/27/16   [name], MD  latanoprost (XALATAN) 0.005 % ophthalmic solution Place 1 drop into both eyes at bedtime.     [provider]  Liraglutide (VICTOZA Roundup) Inject 0.6 mg into the skin daily.     [provider]  metFORMIN (GLUCOPHAGE) 1000 MG tablet Take 0.5 tablets (500 mg total) by mouth daily. 10/27/16   [name], MD  miconazole (MICATIN) 2 % cream Apply 1 application topically 2 (two) times daily. 12/09/15   Adrian Prows, MD  Multiple Vitamins-Minerals (MULTIVITAMIN WITH MINERALS) tablet Take 1 tablet by mouth daily.    [provider]  mupirocin ointment (BACTROBAN) 2 % Apply 1 application topically 3 (three) times daily as needed. 10/30/15   [provider]  pioglitazone (ACTOS) 30 MG tablet Take 15 mg by mouth daily.    [provider]  telmisartan (MICARDIS) 80 MG tablet Take 80 mg by mouth daily.    [provider]    Family History Family History  Problem Relation Age of Onset  . Stroke Mother     Social History Social History   Tobacco Use  . Smoking status: Never Smoker  . Smokeless tobacco: Never Used  Substance Use Topics  . Alcohol use: No  . Drug use: No     Allergies   Patient has no known allergies.   Review of Systems Review of Systems  Constitutional: Positive for diaphoresis. Negative for fever.  HENT: Positive for congestion.   Cardiovascular: Positive for syncope. Negative for chest pain and palpitations.  Gastrointestinal: Negative for abdominal pain.  Musculoskeletal: Positive for back pain.  Neurological: Positive for weakness.  All other systems reviewed and are negative.    Physical Exam Updated Vital Signs Temp (!) 97.2 F (36.2 C) (Oral)   Ht 5\' 4"  (1.626 m)   Wt 94.8 kg (209 lb)   SpO2 96%   BMI  35.87 kg/m    Physical Exam  Constitutional: She is oriented to person, place, and time. She appears well-developed and well-nourished. No distress.  HENT:  Head: Normocephalic and atraumatic.  Mouth/Throat: Oropharynx is clear and moist. Mucous membranes are dry.  Eyes: Conjunctivae and EOM are normal. Pupils are equal, round, and reactive to light.  Neck: Normal range of motion. Neck supple.  Cardiovascular: Normal rate, regular rhythm and intact distal pulses.  Murmur heard. Pulmonary/Chest: Effort normal and breath sounds normal. No respiratory distress. She has no wheezes. She has no rales.  Abdominal: Soft. She exhibits no distension. There is no tenderness. There is no rebound and no guarding.  Musculoskeletal: Normal range of motion. She exhibits no edema or tenderness.  Neurological: She is alert and oriented  to person, place, and time.  Skin: Skin is warm and dry. No rash noted. No erythema.  Psychiatric: She has a normal mood and affect. Her behavior is normal.  Nursing note and vitals reviewed.    ED Treatments / Results  Labs (all labs ordered are listed, but only abnormal results are displayed) Labs Reviewed  CBC WITH DIFFERENTIAL/PLATELET - Abnormal; Notable for the following components:      Result Value   Hemoglobin 11.8 (*)    All other components within normal limits  COMPREHENSIVE METABOLIC PANEL - Abnormal; Notable for the following components:   Glucose, Bld 153 (*)    BUN 38 (*)    Creatinine, Ser 1.04 (*)    Calcium 8.7 (*)    GFR calc non Af Amer 48 (*)    GFR calc Af Amer 56 (*)    All other components within normal limits  CBG MONITORING, ED - Abnormal; Notable for the following components:   Glucose-Capillary 151 (*)    All other components within normal limits  LIPASE, BLOOD  BRAIN NATRIURETIC PEPTIDE  CBG MONITORING, ED  I-STAT TROPONIN, ED  I-STAT CG4 LACTIC ACID, ED    EKG  EKG Interpretation  Date/Time:  Friday March 19 2017  13:11:37 EST Ventricular Rate:  78 PR Interval:    QRS Duration: 89 QT Interval:  403 QTC Calculation: 459 R Axis:   -48 Text Interpretation:  Sinus rhythm Left anterior fascicular block RSR' in V1 or V2, probably normal variant Left ventricular hypertrophy Anterior Q waves, possibly due to LVH Artifact Confirmed by Blanchie Dessert [phone]) on 03/19/2017 1:30:48 PM       Radiology Dg Chest 2 View  Result Date: 03/19/2017 CLINICAL DATA:  Patient with cough for multiple weeks. EXAM: CHEST  2 VIEW COMPARISON:  Chest radiograph 10/24/2016 FINDINGS: Monitoring leads overlie the patient. Stable cardiac and mediastinal contours. Aortic atherosclerosis. No consolidative pulmonary opacities. No pleural effusion or pneumothorax. Thoracic spine degenerative changes. IMPRESSION: No acute cardiopulmonary process. Electronically Signed   By: Lovey Newcomer M.D.   On: 03/19/2017 14:11    Procedures Procedures (including critical care time)  Medications Ordered in ED Medications  sodium chloride 0.9 % bolus 500 mL (not administered)     Initial Impression / Assessment and Plan / ED Course  I have reviewed the triage vital signs and the nursing notes.  Pertinent labs & imaging results that were available during my care of the patient were reviewed by me and considered in my medical decision making (see chart for details).    Patient is an 83 year old female with multiple medical problems presenting today after a syncopal event.  Patient has not felt well over the last few weeks initially starting with URI symptoms but for the last 2 days with vomiting and diarrhea.  Concern for dehydration and possible orthostatic hypotension resulting in syncope.  However patient also has a known cardiac history so concerned that syncope may be related to a cardiac cause as she did have pain across her shoulder blades prior to the event.  Also with patient's cough and respiratory symptoms over the last few weeks  concern for developing pneumonia.  Patient's oxygen saturation is 96% on room air.  She has no evidence of fluid overload at this time to suggest CHF and EKG is unchanged without findings concerning for new ST changes.  Patient denies any increased risk factors for C. difficile.  Patient's blood pressure on my exam is 90/50.  He denies any  fever at any time at this time other than having a low blood pressure does not meet any other sepsis criteria.  CBC, CMP, troponin, BNP, chest x-ray, lactate are all pending. Pt given 551mL of NS.  4:28 PM Patient's labs are reassuring.  Initial troponin is 0 and lactic acid is within normal limits.  Chest x-ray without evidence of acute findings.  Patient is still feeling generally weak but denies any dizziness at this time.  However given her syncope today, discomfort between her shoulder blades and vomiting and diarrhea feel that she would benefit from fluids overnight, cycling troponin and ensuring her vomiting and diarrhea are under control before going home.  Final Clinical Impressions(s) / ED Diagnoses   Final diagnoses:  Syncope and collapse  Dehydration  Vomiting and diarrhea    ED Discharge Orders    None      Blanchie Dessert, MD 03/19/17 1629

## 2017-03-19 NOTE — ED Notes (Signed)
Bed: WA06 Expected date:  Expected time:  Means of arrival:  Comments: 83 yo syncope, orthostatic

## 2017-03-19 NOTE — ED Notes (Signed)
rm assigned 17:01 rm 1409, call report @ 17:21, rm approved 1414 @ 17:09

## 2017-03-19 NOTE — H&P (Signed)
History and Physical    [name] OAC:[id number] DOB: 31-Oct-1930 DOA: 03/19/2017  I have briefly reviewed the patient's prior medical records in [place]  PCP: [name], MD  Patient coming from: Home  Chief Complaint: Syncope  HPI: [name] is a 83 y.o. female with medical history significant of obesity, diastolic CHF, COPD, diabetes mellitus, hypertension, hyperlipidemia, who presents to the hospital with a chief complaint of a syncopal episode.  Patient tells me that she has not been feeling herself for the past couple of weeks, but she just felt "bad", and started having a nonproductive cough that is been bothering her.  She called the PCP, who gave her a cough syrup, and after having that she started feeling abdominal discomfort and GI upset.  She tells me that yesterday she has been taking double the dose of her cough syrup, and developed nausea vomiting as well as several episodes of diarrhea.  Her appetite has been poor in the last few days as well.  She was sitting at the kitchen table when started having blurred vision sweating not feeling well, and per family she lost consciousness for a few minutes.  There are no reported seizure-like activities.  She denies any chest pain or shortness of breath prior or afterwards.  She states that she has had some upper back pain between her shoulder blades, however she tends to get that when she sits in a chair for some time.  When she was evaluated by EMS she was found to be hypotensive and she was brought to the emergency room.  She denies any fever or chills, she has no abdominal pain currently, no nausea currently and has not had any diarrhea since this morning.  ED Course: In the emergency room her initial blood pressure is in the 01U systolic, heart rate in the 70s, she is afebrile and satting well on room air.  She received IV fluids and her blood pressure has improved into the 932 systolic.  Her blood work was remarkable for  glucose of 153, creatinine 1.0, BUN 38.  Her BNP was normal at 33.  Lactic acid was 1.0.  Point-of-care troponin was negative.  TRH was asked for admission for syncope  Review of Systems: As per HPI otherwise 10 point review of systems negative.   Past Medical History:  Diagnosis Date  . CHF (congestive heart failure) ([place])   . COPD (chronic obstructive pulmonary disease) ([place])   . Diabetes mellitus   . Glaucoma   . Hyperlipidemia   . Hypertension     Past Surgical History:  Procedure Laterality Date  . BACK SURGERY    . CARDIAC CATHETERIZATION N/A 12/09/2015   Procedure: Right Heart Cath;  Surgeon: Adrian Prows, MD;  Location: [facility];  Service: Cardiovascular;  Laterality: N/A;  . CHOLECYSTECTOMY  2000  . HERNIA REPAIR  2007  . rupture disk  1970"s  . SHOULDER ARTHROSCOPY WITH ROTATOR CUFF REPAIR  1999   rt shoulder     reports that  has never smoked. she has never used smokeless tobacco. She reports that she does not drink alcohol or use drugs.  No Known Allergies  Family History  Problem Relation Age of Onset  . Stroke Mother     Prior to Admission medications   Medication Sig Start Date End Date Taking? Authorizing Provider  acetaminophen (TYLENOL) 650 MG CR tablet Take 650 mg by mouth every 8 (eight) hours as needed for pain.  Yes [provider]  aspirin EC 81 MG tablet Take 81 mg by mouth daily.   Yes [provider]  glipiZIDE (GLUCOTROL) 5 MG tablet Take 0.5 tablets (2.5 mg total) by mouth daily. 10/27/16  Yes [name], MD  isosorbide mononitrate (IMDUR) 60 MG 24 hr tablet Take 1 tablet (60 mg total) by mouth daily. Patient taking differently: Take 30 mg by mouth daily.  10/27/16  Yes [name], MD  latanoprost (XALATAN) 0.005 % ophthalmic solution Place 1 drop into both eyes at bedtime.    Yes [provider]  Liraglutide (VICTOZA [place]) Inject 0.6 mg into the skin daily.    Yes [provider]  metFORMIN  (GLUCOPHAGE) 1000 MG tablet Take 0.5 tablets (500 mg total) by mouth daily. 10/27/16  Yes [name], MD  Multiple Vitamins-Minerals (MULTIVITAMIN WITH MINERALS) tablet Take 1 tablet by mouth daily.   Yes [provider]  pioglitazone (ACTOS) 30 MG tablet Take 15 mg by mouth daily.   Yes [provider]  promethazine-phenylephrine (PROMETHAZINE VC) 6.25-5 MG/5ML SYRP Take 5 mLs by mouth every 8 (eight) hours. 03/16/17  Yes [provider]  telmisartan (MICARDIS) 80 MG tablet Take 40 mg by mouth daily.    Yes [provider]  triamcinolone ointment (KENALOG) 0.1 % Apply 1 application topically 2 (two) times daily. 03/01/17  Yes [provider]  glucose blood test strip 1 each by Other route as needed for other. Use as instructed    [provider]  LUMIGAN 0.01 % SOLN  12/28/16   [provider]  mupirocin ointment (BACTROBAN) 2 %  01/18/17   [provider]    Physical Exam: Vitals:   03/19/17 1304 03/19/17 1317 03/19/17 1324 03/19/17 1556  BP:    118/61  Pulse:    77  Resp:    16  Temp:   (!) 97.2 F (36.2 C)   TempSrc:   Oral   SpO2: 96%   94%  Weight:  94.8 kg (209 lb)    Height:  5\' 4"  (1.626 m)       Constitutional: NAD, calm, comfortable Eyes: lids and conjunctivae normal ENMT: Mucous membranes are dry Neck: normal, supple Respiratory: clear to auscultation bilaterally, no wheezing, no crackles. Normal respiratory effort. No accessory muscle use.  Cardiovascular: Regular rate and rhythm, no murmurs / rubs / gallops. No extremity edema. 2+ pedal pulses.  Abdomen: no tenderness, no masses palpated. Bowel sounds positive.  Musculoskeletal: no clubbing / cyanosis. Normal muscle tone.  Skin: no rashes Neurologic: CN 2-12 grossly intact. Strength 5/5 in all 4.  Psychiatric: Normal judgment and insight. Alert and oriented x 3. Normal mood.   Labs on Admission: I have personally reviewed following labs and  imaging studies  CBC: Recent Labs  Lab 03/19/17 1455  WBC 6.8  NEUTROABS 5.6  HGB 11.8*  HCT 37.0  MCV 90.7  PLT PLATELET CLUMPS NOTED ON SMEAR, COUNT APPEARS DECREASED   Basic Metabolic Panel: Recent Labs  Lab 03/19/17 1455  NA 137  K 4.6  CL 104  CO2 24  GLUCOSE 153*  BUN 38*  CREATININE 1.04*  CALCIUM 8.7*   GFR: Estimated Creatinine Clearance: 44.9 mL/min (A) (by C-G formula based on SCr of 1.04 mg/dL (H)). Liver Function Tests: Recent Labs  Lab 03/19/17 1455  AST 20  ALT 14  ALKPHOS 71  BILITOT 0.9  PROT 6.8  ALBUMIN 3.7   Recent Labs  Lab 03/19/17 1455  LIPASE 31  No results for input(s): AMMONIA in the last 168 hours. Coagulation Profile: No results for input(s): INR, PROTIME in the last 168 hours. Cardiac Enzymes: No results for input(s): CKTOTAL, CKMB, CKMBINDEX, TROPONINI in the last 168 hours. BNP (last 3 results) No results for input(s): PROBNP in the last 8760 hours. HbA1C: No results for input(s): HGBA1C in the last 72 hours. CBG: Recent Labs  Lab 03/19/17 1317  GLUCAP 151*   Lipid Profile: No results for input(s): CHOL, HDL, LDLCALC, TRIG, CHOLHDL, LDLDIRECT in the last 72 hours. Thyroid Function Tests: No results for input(s): TSH, T4TOTAL, FREET4, T3FREE, THYROIDAB in the last 72 hours. Anemia Panel: No results for input(s): VITAMINB12, FOLATE, FERRITIN, TIBC, IRON, RETICCTPCT in the last 72 hours. Urine analysis:    Component Value Date/Time   COLORURINE YELLOW 10/24/2016 1350   APPEARANCEUR HAZY (A) 10/24/2016 1350   LABSPEC 1.016 10/24/2016 1350   PHURINE 5.0 10/24/2016 1350   GLUCOSEU NEGATIVE 10/24/2016 1350   HGBUR NEGATIVE 10/24/2016 1350   BILIRUBINUR NEGATIVE 10/24/2016 1350   KETONESUR NEGATIVE 10/24/2016 1350   PROTEINUR NEGATIVE 10/24/2016 1350   UROBILINOGEN 0.2 12/05/2010 1740   NITRITE NEGATIVE 10/24/2016 1350   LEUKOCYTESUR MODERATE (A) 10/24/2016 1350     Radiological Exams on Admission: Dg Chest 2  View  Result Date: 03/19/2017 CLINICAL DATA:  Patient with cough for multiple weeks. EXAM: CHEST  2 VIEW COMPARISON:  Chest radiograph 10/24/2016 FINDINGS: Monitoring leads overlie the patient. Stable cardiac and mediastinal contours. Aortic atherosclerosis. No consolidative pulmonary opacities. No pleural effusion or pneumothorax. Thoracic spine degenerative changes. IMPRESSION: No acute cardiopulmonary process. Electronically Signed   By: Lovey Newcomer M.D.   On: 03/19/2017 14:11    EKG: Independently reviewed.  Sinus rhythm  Assessment/Plan Active Problems:   Pulmonary hypertension (HCC)   Syncope   Chronic diastolic CHF (congestive heart failure) (HCC)   Diabetes mellitus (HCC)   Obesity, diabetes, and hypertension syndrome (HCC)   CKD (chronic kidney disease) stage 3, GFR 30-59 ml/min (HCC)   Syncope -This has happened in the setting of probable dehydration due to nausea vomiting and diarrhea for couple of days, also potentially with a viral URI syndrome that has been going on for 1-2 weeks. -She was hypotensive on admission, her blood pressure has improved with IV fluids, continue gentle hydration overnight -Monitor on telemetry, given reported back pain we will cycle cardiac enzymes overnight -She was admitted with a syncopal episode about 4 months ago and at that time her blood pressure medications have been reduced in half and she was taken off of Lasix and hydrochlorothiazide.  She underwent a 2D echo at that time which showed an EF of 60-65%, and grade 1 diastolic dysfunction -We will recheck orthostatics in the morning  Chronic diastolic CHF -Looks intravascularly depleted, IV fluids as above  Type 2 diabetes mellitus -Hold metformin, place patient on sliding scale  Chronic kidney disease stage III -Creatinine is baseline is between 1.2 and 1.4, currently appears at baseline  Hypertension -Hold all her home medications at this point   DVT prophylaxis: Loevnox  Code  Status: Full code  Family Communication: daughter in the room Disposition Plan: admit to telemetry, homewhenready Consults called: none     Admission status: Observation   At the point of initial evaluation, it is my clinical opinion that admission for OBSERVATION is reasonable and necessary because the patient's presenting complaints in the context of their chronic conditions represent sufficient risk of deterioration or significant morbidity to constitute reasonable  grounds for close observation in the hospital setting, but that the patient may be medically stable for discharge from the hospital within 24 to 48 hours.   Marzetta Board, MD [facility] Pager [phone]  If 7PM-7AM, please contact night-coverage [email or web address] Password [email or web address]  03/19/2017, 5:00 PM

## 2017-03-20 DIAGNOSIS — I272 Pulmonary hypertension, unspecified: Secondary | ICD-10-CM | POA: Diagnosis not present

## 2017-03-20 DIAGNOSIS — R55 Syncope and collapse: Secondary | ICD-10-CM | POA: Diagnosis not present

## 2017-03-20 DIAGNOSIS — R1110 Vomiting, unspecified: Secondary | ICD-10-CM | POA: Diagnosis not present

## 2017-03-20 DIAGNOSIS — Z7984 Long term (current) use of oral hypoglycemic drugs: Secondary | ICD-10-CM | POA: Diagnosis not present

## 2017-03-20 DIAGNOSIS — E1122 Type 2 diabetes mellitus with diabetic chronic kidney disease: Secondary | ICD-10-CM | POA: Diagnosis not present

## 2017-03-20 DIAGNOSIS — N183 Chronic kidney disease, stage 3 (moderate): Secondary | ICD-10-CM | POA: Diagnosis not present

## 2017-03-20 DIAGNOSIS — Z7982 Long term (current) use of aspirin: Secondary | ICD-10-CM | POA: Diagnosis not present

## 2017-03-20 DIAGNOSIS — E860 Dehydration: Secondary | ICD-10-CM | POA: Diagnosis not present

## 2017-03-20 DIAGNOSIS — J449 Chronic obstructive pulmonary disease, unspecified: Secondary | ICD-10-CM | POA: Diagnosis present

## 2017-03-20 DIAGNOSIS — I5032 Chronic diastolic (congestive) heart failure: Secondary | ICD-10-CM | POA: Diagnosis not present

## 2017-03-20 DIAGNOSIS — E669 Obesity, unspecified: Secondary | ICD-10-CM | POA: Diagnosis not present

## 2017-03-20 DIAGNOSIS — Z79899 Other long term (current) drug therapy: Secondary | ICD-10-CM | POA: Diagnosis not present

## 2017-03-20 DIAGNOSIS — I130 Hypertensive heart and chronic kidney disease with heart failure and stage 1 through stage 4 chronic kidney disease, or unspecified chronic kidney disease: Secondary | ICD-10-CM | POA: Diagnosis present

## 2017-03-20 DIAGNOSIS — I10 Essential (primary) hypertension: Secondary | ICD-10-CM | POA: Diagnosis not present

## 2017-03-20 DIAGNOSIS — Z6835 Body mass index (BMI) 35.0-35.9, adult: Secondary | ICD-10-CM | POA: Diagnosis not present

## 2017-03-20 DIAGNOSIS — R197 Diarrhea, unspecified: Secondary | ICD-10-CM

## 2017-03-20 DIAGNOSIS — E785 Hyperlipidemia, unspecified: Secondary | ICD-10-CM | POA: Diagnosis present

## 2017-03-20 DIAGNOSIS — E119 Type 2 diabetes mellitus without complications: Secondary | ICD-10-CM | POA: Diagnosis not present

## 2017-03-20 DIAGNOSIS — H409 Unspecified glaucoma: Secondary | ICD-10-CM | POA: Diagnosis present

## 2017-03-20 LAB — TROPONIN I
Troponin I: <0.03 ng/mL (ref <0.03)
Troponin I: <0.03 ng/mL (ref <0.03)

## 2017-03-20 LAB — GLUCOSE, CAPILLARY
GLUCOSE-CAPILLARY: 100 mg/dL — AB (ref 65–99)
GLUCOSE-CAPILLARY: 136 mg/dL — AB (ref 65–99)
GLUCOSE-CAPILLARY: 129 mg/dL — AB (ref 65–99)
GLUCOSE-CAPILLARY: 168 mg/dL — AB (ref 65–99)

## 2017-03-20 LAB — BASIC METABOLIC PANEL
Anion gap: 6 (ref 5–15)
BUN: 33 mg/dL — ABNORMAL HIGH (ref 6–20)
CHLORIDE: 106 mmol/L (ref 101–111)
CO2: 27 mmol/L (ref 22–32)
CREATININE: 0.96 mg/dL (ref 0.44–1.00)
Calcium: 8.2 mg/dL — ABNORMAL LOW (ref 8.9–10.3)
GFR calc non Af Amer: 53 mL/min — ABNORMAL LOW (ref >60)
Glucose, Bld: 96 mg/dL (ref 65–99)
POTASSIUM: 3.8 mmol/L (ref 3.5–5.1)
SODIUM: 139 mmol/L (ref 135–145)

## 2017-03-20 MED ORDER — IRBESARTAN 150 MG PO TABS
75.0000 mg | ORAL_TABLET | Freq: Every day | ORAL | Status: DC
  Administered 2017-03-20 – 2017-03-21 (×2): 75 mg via ORAL
  Filled 2017-03-20 (×2): qty 1

## 2017-03-20 MED ORDER — HYDROCODONE-HOMATROPINE 5-1.5 MG/5ML PO SYRP: 5.0000 mL | ORAL_SOLUTION | Freq: Four times a day (QID) | ORAL | Status: DC | PRN

## 2017-03-20 NOTE — Progress Notes (Signed)
PROGRESS NOTE  [name] FAO:[id number] DOB: April 10, 1931 DOA: 03/19/2017 PCP: [name], MD   LOS: 0 days   Brief Narrative / Interim history: [name] is a 83 y.o. female with medical history significant of obesity, diastolic CHF, COPD, diabetes mellitus, hypertension, hyperlipidemia, who presents to the hospital with a chief complaint of a syncopal episode following dehydration in the setting of profuse diarrhea  Assessment & Plan: Active Problems:   Pulmonary hypertension (HCC)   Syncope   Chronic diastolic CHF (congestive heart failure) (HCC)   Diabetes mellitus (HCC)   Obesity, diabetes, and hypertension syndrome (HCC)   CKD (chronic kidney disease) stage 3, GFR 30-59 ml/min (HCC)   Syncope -likely due to dehydration, received IVF overnight  -telemetry unremarkable, troponin negative x 3 -negative orthostatics this morning   Diarrhea -appears to be temporally related to her cough syrup however has been feeling "bad" for 1-2 weeks -she has a history of recurrent severe diarrheal episodes which results in hypotension and syncope with similar presentation 4 months ago and last year. She cannot identify a trigger for her loose stools -initially diarrhea resolved int he ER however today started again, and was severe this morning that she could not make it to the bathroom, so high concern that she will become hypotensive again, continue to monitor -will check GI pathogen panel / C diff  Chronic diastolic CHF -hold further fluids for now  DM2 -SSI  CKD III -Cr stable, improved with fluids  HTN -continue Avapro, Imdur   DVT prophylaxis: Lovenox Code Status: Full code Family Communication: 5+ family members at bedside Disposition Plan: home when ready  Consultants:   None   Procedures:   None   Antimicrobials:  None    Subjective: - no chest pain, shortness of breath, no abdominal pain, nausea or vomiting. Complains of significant diarrhea this  morning   Objective: Vitals:   03/20/17 0609 03/20/17 0958 03/20/17 1000 03/20/17 1001  BP: (!) 151/61 130/70 (!) 153/77 (!) 166/92  Pulse: 75     Resp: 16     Temp: 97.7 F (36.5 C)     TempSrc: Oral     SpO2: 98%     Weight:      Height:        Intake/Output Summary (Last 24 hours) at 03/20/2017 1403 Last data filed at 03/20/2017 6962 Gross per 24 hour  Intake 1932.92 ml  Output -  Net 1932.92 ml   Filed Weights   03/19/17 1317 03/19/17 1803  Weight: 94.8 kg (209 lb) 94.8 kg (209 lb 0 oz)    Examination:  Constitutional: NAD Eyes: lids and conjunctivae normal Respiratory: clear to auscultation bilaterally, no wheezing, no crackles. Normal respiratory effort.  Cardiovascular: Regular rate and rhythm, no murmurs / rubs / gallops. No LE edema. Abdomen: no tenderness. Bowel sounds positive.  Skin: no rashes, lesions, ulcers. No induration Neurologic: non focal   Data Reviewed: I have independently reviewed following labs and imaging studies   CBC: Recent Labs  Lab 03/19/17 1455  WBC 6.8  NEUTROABS 5.6  HGB 11.8*  HCT 37.0  MCV 90.7  PLT PLATELET CLUMPS NOTED ON SMEAR, COUNT APPEARS DECREASED   Basic Metabolic Panel: Recent Labs  Lab 03/19/17 1455 03/20/17 0545  NA 137 139  K 4.6 3.8  CL 104 106  CO2 24 27  GLUCOSE 153* 96  BUN 38* 33*  CREATININE 1.04* 0.96  CALCIUM 8.7* 8.2*   GFR: Estimated Creatinine Clearance: 48.7 mL/min (by  C-G formula based on SCr of 0.96 mg/dL). Liver Function Tests: Recent Labs  Lab 03/19/17 1455  AST 20  ALT 14  ALKPHOS 71  BILITOT 0.9  PROT 6.8  ALBUMIN 3.7   Recent Labs  Lab 03/19/17 1455  LIPASE 31   No results for input(s): AMMONIA in the last 168 hours. Coagulation Profile: No results for input(s): INR, PROTIME in the last 168 hours. Cardiac Enzymes: Recent Labs  Lab 03/19/17 1856 03/19/17 2335 03/20/17 0545  TROPONINI <0.03 <0.03 <0.03   BNP (last 3 results) No results for input(s): PROBNP in  the last 8760 hours. HbA1C: No results for input(s): HGBA1C in the last 72 hours. CBG: Recent Labs  Lab 03/19/17 1317 03/19/17 2118 03/20/17 0735 03/20/17 1220  GLUCAP 151* 106* 100* 136*   Lipid Profile: No results for input(s): CHOL, HDL, LDLCALC, TRIG, CHOLHDL, LDLDIRECT in the last 72 hours. Thyroid Function Tests: No results for input(s): TSH, T4TOTAL, FREET4, T3FREE, THYROIDAB in the last 72 hours. Anemia Panel: No results for input(s): VITAMINB12, FOLATE, FERRITIN, TIBC, IRON, RETICCTPCT in the last 72 hours. Urine analysis:    Component Value Date/Time   COLORURINE YELLOW 10/24/2016 1350   APPEARANCEUR HAZY (A) 10/24/2016 1350   LABSPEC 1.016 10/24/2016 1350   PHURINE 5.0 10/24/2016 1350   GLUCOSEU NEGATIVE 10/24/2016 1350   HGBUR NEGATIVE 10/24/2016 1350   BILIRUBINUR NEGATIVE 10/24/2016 1350   KETONESUR NEGATIVE 10/24/2016 1350   PROTEINUR NEGATIVE 10/24/2016 1350   UROBILINOGEN 0.2 12/05/2010 1740   NITRITE NEGATIVE 10/24/2016 1350   LEUKOCYTESUR MODERATE (A) 10/24/2016 1350   Sepsis Labs: Invalid input(s): PROCALCITONIN, LACTICIDVEN  No results found for this or any previous visit (from the past 240 hour(s)).    Radiology Studies: Dg Chest 2 View  Result Date: 03/19/2017 CLINICAL DATA:  Patient with cough for multiple weeks. EXAM: CHEST  2 VIEW COMPARISON:  Chest radiograph 10/24/2016 FINDINGS: Monitoring leads overlie the patient. Stable cardiac and mediastinal contours. Aortic atherosclerosis. No consolidative pulmonary opacities. No pleural effusion or pneumothorax. Thoracic spine degenerative changes. IMPRESSION: No acute cardiopulmonary process. Electronically Signed   By: Lovey Newcomer M.D.   On: 03/19/2017 14:11     Scheduled Meds: . chlorhexidine  15 mL Mouth Rinse BID  . enoxaparin (LOVENOX) injection  40 mg Subcutaneous Q24H  . insulin aspart  0-9 Units Subcutaneous TID WC  . irbesartan  75 mg Oral Daily  . isosorbide mononitrate  30 mg Oral  Daily  . mouth rinse  15 mL Mouth Rinse q12n4p  . multivitamin with minerals  1 tablet Oral Daily   Continuous Infusions: . sodium chloride Stopped (03/19/17 1825)  . sodium chloride 75 mL/hr at 03/19/17 2118    Marzetta Board, MD, PhD [facility] Pager [phone]  If 7PM-7AM, please contact night-coverage [email or web address] Password [email or web address] 03/20/2017, 2:03 PM

## 2017-03-21 DIAGNOSIS — R55 Syncope and collapse: Principal | ICD-10-CM

## 2017-03-21 LAB — C DIFFICILE QUICK SCREEN W PCR REFLEX
C DIFFICILE (CDIFF) INTERP: NOT DETECTED
C DIFFICILE (CDIFF) TOXIN: NEGATIVE
C Diff antigen: NEGATIVE

## 2017-03-21 LAB — GLUCOSE, CAPILLARY: GLUCOSE-CAPILLARY: 120 mg/dL — AB (ref 65–99)

## 2017-03-21 MED ORDER — HYDROCODONE-HOMATROPINE 5-1.5 MG/5ML PO SYRP: 5.0000 mL | ORAL_SOLUTION | Freq: Four times a day (QID) | ORAL | 0 refills | Status: DC | PRN

## 2017-03-21 NOTE — Care Management Note (Signed)
Case Management Note  Patient Details  Name: [name] MRN: [id number] Date of Birth: 12-24-1930  Subjective/Objective:     Pulm HTN, CHF, DM                Action/Plan: Discharge Planning:  Chart reviewed. No NCM needs identified.   PCP [name] MD  Expected Discharge Date:  03/21/17               Expected Discharge Plan:  Home/Self Care  In-House Referral:  NA  Discharge planning Services  CM Consult  Post Acute Care Choice:  NA Choice offered to:  NA  DME Arranged:  N/A DME Agency:  NA  HH Arranged:  NA HH Agency:  NA  Status of Service:  Completed, signed off  If discussed at [place] of Stay Meetings, dates discussed:    Additional Comments:  Erenest Rasher, RN 03/21/2017, 11:01 AM

## 2017-03-21 NOTE — Discharge Summary (Signed)
Physician Discharge Summary  TAHLOR BERENGUER JJH:[id number] DOB: 09-Apr-1931 DOA: 03/19/2017  PCP: [name], MD  Admit date: 03/19/2017 Discharge date: 03/21/2017  Admitted From: Home Disposition: Home  Recommendations for Outpatient Follow-up:  1. Follow up with PCP in 1-2 weeks  Home Health: none Equipment/Devices: none  Discharge Condition: stable CODE STATUS: Full code Diet recommendation: Diabetic  HPI: [name] is a 83 y.o. female with medical history significant of obesity, diastolic CHF, COPD, diabetes mellitus, hypertension, hyperlipidemia, who presents to the hospital with a chief complaint of a syncopal episode.  Patient tells me that she has not been feeling herself for the past couple of weeks, but she just felt "bad", and started having a nonproductive cough that is been bothering her.  She called the PCP, who gave her a cough syrup, and after having that she started feeling abdominal discomfort and GI upset.  She tells me that yesterday she has been taking double the dose of her cough syrup, and developed nausea vomiting as well as several episodes of diarrhea.  Her appetite has been poor in the last few days as well.  She was sitting at the kitchen table when started having blurred vision sweating not feeling well, and per family she lost consciousness for a few minutes.  There are no reported seizure-like activities.  She denies any chest pain or shortness of breath prior or afterwards.  She states that she has had some upper back pain between her shoulder blades, however she tends to get that when she sits in a chair for some time.  When she was evaluated by EMS she was found to be hypotensive and she was brought to the emergency room.  She denies any fever or chills, she has no abdominal pain currently, no nausea currently and has not had any diarrhea since this morning. ED Course: In the emergency room her initial blood pressure is in the 81E systolic, heart rate in  the 70s, she is afebrile and satting well on room air.  She received IV fluids and her blood pressure has improved into the 563 systolic.  Her blood work was remarkable for glucose of 153, creatinine 1.0, BUN 38.  Her BNP was normal at 33.  Lactic acid was 1.0.  Point-of-care troponin was negative.  TRH was asked for admission for syncope    Hospital Course: Discharge Diagnoses:  Active Problems:   Pulmonary hypertension (HCC)   Syncope   Chronic diastolic CHF (congestive heart failure) (HCC)   Diabetes mellitus (HCC)   Obesity, diabetes, and hypertension syndrome (HCC)   CKD (chronic kidney disease) stage 3, GFR 30-59 ml/min (HCC)   Syncope -patient was admitted to the hospital with a syncopal episodes likely in the setting of dehydration due to GI upset/poor p.o. intake as well as diarrhea.  She was initially hypotensive and she received IV fluids.  Her blood pressure improved.  2D echo done 4 months ago without significant findings, she was monitored on telemetry without acute events, her cardiac enzymes were cycled and have remained negative.  Her blood pressure has been stable, she was not orthostatic, was feeling back to baseline and was discharged home in stable condition. Diarrhea - This is her third episode in the last 2 years where patient gets significant GI upset with diarrhea resulting in dehydration and being hospitalized for syncope.  Could not identify a specific trigger.  This time around may have been temporally related to her cough syrup as she may have  had a side effect to that.  She did have one recurrent diarrheal episode in the hospital, and plans were for C. difficile and GI pathogen panel to be sent however she had not had a repeat BM while hospitalized. Chronic diastolic CHF -tolerated fluid well in the setting of dehydration, she is euvolemic on discharge, resume home medications DM2 -resume home medications CKD III -Cr stable, improved with fluids HTN -continue  Avapro, Imdur, initially hypotensive but blood pressure has been on the high side, she was no longer orthostatic.   Discharge Instructions   Allergies as of 03/21/2017   No Known Allergies     Medication List    TAKE these medications   acetaminophen 650 MG CR tablet Commonly known as:  TYLENOL Take 650 mg by mouth every 8 (eight) hours as needed for pain.   ACTOS 30 MG tablet Generic drug:  pioglitazone Take 15 mg by mouth daily.   aspirin EC 81 MG tablet Take 81 mg by mouth daily.   glipiZIDE 5 MG tablet Commonly known as:  GLUCOTROL Take 0.5 tablets (2.5 mg total) by mouth daily.   glucose blood test strip 1 each by Other route as needed for other. Use as instructed   HYDROcodone-homatropine 5-1.5 MG/5ML syrup Commonly known as:  HYCODAN Take 5 mLs by mouth every 6 (six) hours as needed for cough.   isosorbide mononitrate 60 MG 24 hr tablet Commonly known as:  IMDUR Take 1 tablet (60 mg total) by mouth daily. What changed:  how much to take   latanoprost 0.005 % ophthalmic solution Commonly known as:  XALATAN Place 1 drop into both eyes at bedtime.   LUMIGAN 0.01 % Soln Generic drug:  bimatoprost Place 1 drop into both eyes at bedtime.   metFORMIN 1000 MG tablet Commonly known as:  GLUCOPHAGE Take 0.5 tablets (500 mg total) by mouth daily.   multivitamin with minerals tablet Take 1 tablet by mouth daily.   mupirocin ointment 2 % Commonly known as:  BACTROBAN Apply 1 application topically daily as needed (sores).   promethazine-phenylephrine 6.25-5 MG/5ML Syrp Commonly known as:  PROMETHAZINE VC Take 5 mLs by mouth every 8 (eight) hours.   telmisartan 80 MG tablet Commonly known as:  MICARDIS Take 40 mg by mouth daily.   triamcinolone ointment 0.1 % Commonly known as:  KENALOG Apply 1 application topically 2 (two) times daily.   VICTOZA [place] Inject 0.6 mg into the skin daily.      Follow-up Information    [name], MD. Schedule an  appointment as soon as possible for a visit in 2 week(s).   Specialty:  Internal Medicine Contact information: [address] [place] [address] [phone]           Consultations:  None   Procedures/Studies:  Dg Chest 2 View  Result Date: 03/19/2017 CLINICAL DATA:  Patient with cough for multiple weeks. EXAM: CHEST  2 VIEW COMPARISON:  Chest radiograph 10/24/2016 FINDINGS: Monitoring leads overlie the patient. Stable cardiac and mediastinal contours. Aortic atherosclerosis. No consolidative pulmonary opacities. No pleural effusion or pneumothorax. Thoracic spine degenerative changes. IMPRESSION: No acute cardiopulmonary process. Electronically Signed   By: Lovey Newcomer M.D.   On: 03/19/2017 14:11     Subjective: - no chest pain, shortness of breath, no abdominal pain, nausea or vomiting.   Discharge Exam: Vitals:   03/20/17 2159 03/21/17 0513  BP: (!) 138/58 (!) 143/54  Pulse:  76  Resp:  16  Temp:  98.4 F (36.9 C)  SpO2:  99%    General: Pt is alert, awake, not in acute distress Cardiovascular: RRR, S1/S2 +, no rubs, no gallops Respiratory: CTA bilaterally, no wheezing, no rhonchi Abdominal: Soft, NT, ND, bowel sounds + Extremities: no edema, no cyanosis    The results of significant diagnostics from this hospitalization (including imaging, microbiology, ancillary and laboratory) are listed below for reference.     Microbiology: Recent Results (from the past 240 hour(s))  C difficile quick scan w PCR reflex     Status: None   Collection Time: 03/20/17 10:39 AM  Result Value Ref Range Status   C Diff antigen NEGATIVE NEGATIVE Final   C Diff toxin NEGATIVE NEGATIVE Final   C Diff interpretation No C. difficile detected.  Final     Labs: BNP (last 3 results) Recent Labs    10/24/16 1245 03/19/17 1455  BNP 70.7 56.2   Basic Metabolic Panel: Recent Labs  Lab 03/19/17 1455 03/20/17 0545  NA 137 139  K 4.6 3.8  CL 104 106  CO2  24 27  GLUCOSE 153* 96  BUN 38* 33*  CREATININE 1.04* 0.96  CALCIUM 8.7* 8.2*   Liver Function Tests: Recent Labs  Lab 03/19/17 1455  AST 20  ALT 14  ALKPHOS 71  BILITOT 0.9  PROT 6.8  ALBUMIN 3.7   Recent Labs  Lab 03/19/17 1455  LIPASE 31   No results for input(s): AMMONIA in the last 168 hours. CBC: Recent Labs  Lab 03/19/17 1455  WBC 6.8  NEUTROABS 5.6  HGB 11.8*  HCT 37.0  MCV 90.7  PLT PLATELET CLUMPS NOTED ON SMEAR, COUNT APPEARS DECREASED   Cardiac Enzymes: Recent Labs  Lab 03/19/17 1856 03/19/17 2335 03/20/17 0545  TROPONINI <0.03 <0.03 <0.03   BNP: Invalid input(s): POCBNP CBG: Recent Labs  Lab 03/20/17 0735 03/20/17 1220 03/20/17 1647 03/20/17 2112 03/21/17 0739  GLUCAP 100* 136* 129* 168* 120*   D-Dimer No results for input(s): DDIMER in the last 72 hours. Hgb A1c No results for input(s): HGBA1C in the last 72 hours. Lipid Profile No results for input(s): CHOL, HDL, LDLCALC, TRIG, CHOLHDL, LDLDIRECT in the last 72 hours. Thyroid function studies No results for input(s): TSH, T4TOTAL, T3FREE, THYROIDAB in the last 72 hours.  Invalid input(s): FREET3 Anemia work up No results for input(s): VITAMINB12, FOLATE, FERRITIN, TIBC, IRON, RETICCTPCT in the last 72 hours. Urinalysis    Component Value Date/Time   COLORURINE YELLOW 10/24/2016 1350   APPEARANCEUR HAZY (A) 10/24/2016 1350   LABSPEC 1.016 10/24/2016 1350   PHURINE 5.0 10/24/2016 1350   GLUCOSEU NEGATIVE 10/24/2016 1350   HGBUR NEGATIVE 10/24/2016 1350   BILIRUBINUR NEGATIVE 10/24/2016 1350   KETONESUR NEGATIVE 10/24/2016 1350   PROTEINUR NEGATIVE 10/24/2016 1350   UROBILINOGEN 0.2 12/05/2010 1740   NITRITE NEGATIVE 10/24/2016 1350   LEUKOCYTESUR MODERATE (A) 10/24/2016 1350   Sepsis Labs Invalid input(s): PROCALCITONIN,  WBC,  LACTICIDVEN   Time coordinating discharge: 25 minutes  SIGNED:  Marzetta Board, MD  [facility] 03/21/2017, 1:29 PM Pager  [phone]  If 7PM-7AM, please contact night-coverage [email or web address] Password [email or web address]

## 2017-03-21 NOTE — Progress Notes (Signed)
All d/c instructions given w/ verbal understanding. Awaiting ride

## 2017-03-22 LAB — GASTROINTESTINAL PANEL BY PCR, STOOL (REPLACES STOOL CULTURE)
ASTROVIRUS: NOT DETECTED
Adenovirus F40/41: NOT DETECTED
CAMPYLOBACTER SPECIES: NOT DETECTED
CRYPTOSPORIDIUM: NOT DETECTED
Cyclospora cayetanensis: NOT DETECTED
ENTEROAGGREGATIVE E COLI (EAEC): NOT DETECTED
Entamoeba histolytica: NOT DETECTED
Enteropathogenic E coli (EPEC): NOT DETECTED
Enterotoxigenic E coli (ETEC): NOT DETECTED
GIARDIA LAMBLIA: NOT DETECTED
NOROVIRUS GI/GII: DETECTED — AB
Plesimonas shigelloides: NOT DETECTED
ROTAVIRUS A: NOT DETECTED
Salmonella species: NOT DETECTED
Sapovirus (I, II, IV, and V): NOT DETECTED
Shiga like toxin producing E coli (STEC): NOT DETECTED
Shigella/Enteroinvasive E coli (EIEC): NOT DETECTED
Vibrio cholerae: NOT DETECTED
Vibrio species: NOT DETECTED
YERSINIA ENTEROCOLITICA: NOT DETECTED

## 2017-04-12 DIAGNOSIS — T300 Burn of unspecified body region, unspecified degree: Secondary | ICD-10-CM | POA: Diagnosis not present

## 2017-04-12 DIAGNOSIS — L570 Actinic keratosis: Secondary | ICD-10-CM | POA: Diagnosis not present

## 2017-04-12 DIAGNOSIS — B078 Other viral warts: Secondary | ICD-10-CM | POA: Diagnosis not present

## 2017-04-12 DIAGNOSIS — L820 Inflamed seborrheic keratosis: Secondary | ICD-10-CM | POA: Diagnosis not present

## 2017-04-12 DIAGNOSIS — X32XXXD Exposure to sunlight, subsequent encounter: Secondary | ICD-10-CM | POA: Diagnosis not present

## 2017-05-17 DIAGNOSIS — I10 Essential (primary) hypertension: Secondary | ICD-10-CM | POA: Diagnosis not present

## 2017-05-17 DIAGNOSIS — I350 Nonrheumatic aortic (valve) stenosis: Secondary | ICD-10-CM | POA: Diagnosis not present

## 2017-05-17 DIAGNOSIS — R0602 Shortness of breath: Secondary | ICD-10-CM | POA: Diagnosis not present

## 2017-05-17 DIAGNOSIS — I5032 Chronic diastolic (congestive) heart failure: Secondary | ICD-10-CM | POA: Diagnosis not present

## 2017-05-24 DIAGNOSIS — M5136 Other intervertebral disc degeneration, lumbar region: Secondary | ICD-10-CM | POA: Diagnosis not present

## 2017-06-02 DIAGNOSIS — E119 Type 2 diabetes mellitus without complications: Secondary | ICD-10-CM | POA: Diagnosis not present

## 2017-06-02 DIAGNOSIS — I509 Heart failure, unspecified: Secondary | ICD-10-CM | POA: Diagnosis not present

## 2017-06-02 DIAGNOSIS — E7800 Pure hypercholesterolemia, unspecified: Secondary | ICD-10-CM | POA: Diagnosis not present

## 2017-06-02 DIAGNOSIS — I10 Essential (primary) hypertension: Secondary | ICD-10-CM | POA: Diagnosis not present

## 2017-06-09 DIAGNOSIS — M79605 Pain in left leg: Secondary | ICD-10-CM | POA: Diagnosis not present

## 2017-06-09 DIAGNOSIS — E119 Type 2 diabetes mellitus without complications: Secondary | ICD-10-CM | POA: Diagnosis not present

## 2017-06-09 DIAGNOSIS — I10 Essential (primary) hypertension: Secondary | ICD-10-CM | POA: Diagnosis not present

## 2017-06-14 ENCOUNTER — Other Ambulatory Visit: Payer: Self-pay

## 2017-06-14 ENCOUNTER — Encounter: Payer: Self-pay | Admitting: Vascular Surgery

## 2017-06-14 ENCOUNTER — Ambulatory Visit (INDEPENDENT_AMBULATORY_CARE_PROVIDER_SITE_OTHER): Payer: Medicare Other | Admitting: Vascular Surgery

## 2017-06-14 VITALS — BP 115/58 | HR 92 | Temp 97.3°F | Resp 18 | Ht 64.0 in | Wt 215.0 lb

## 2017-06-14 DIAGNOSIS — I83892 Varicose veins of left lower extremities with other complications: Secondary | ICD-10-CM

## 2017-06-14 NOTE — Progress Notes (Signed)
Subjective:     Patient ID: [name], female   DOB: [birth year], 84 y.o.   MRN: [id number]  HPI This 84 year old female is known to me having undergone laser ablation of the left great saphenous vein followed by sclerotherapy both performed in 2016. She had the sclerotherapy performed in the left pretibial region. This has been asymptomatic for the past few years but recently she developed some tenderness in front of her "shin bone". She has no history of DVT thrombophlebitis stasis ulcers or bleeding. She's had no change in distal edema.  Past Medical History:  Diagnosis Date  . CHF (congestive heart failure) ([place])   . COPD (chronic obstructive pulmonary disease) ([place])   . Diabetes mellitus   . Glaucoma   . Hyperlipidemia   . Hypertension     Social History   Tobacco Use  . Smoking status: Never Smoker  . Smokeless tobacco: Never Used  Substance Use Topics  . Alcohol use: No    Family History  Problem Relation Age of Onset  . Stroke Mother     No Known Allergies   Current Outpatient Medications:  .  acetaminophen (TYLENOL) 650 MG CR tablet, Take 650 mg by mouth every 8 (eight) hours as needed for pain., Disp: , Rfl:  .  amLODipine (NORVASC) 2.5 MG tablet, TK 1 T PO QD FOR BP, Disp: , Rfl: 6 .  aspirin EC 81 MG tablet, Take 81 mg by mouth daily., Disp: , Rfl:  .  B-D UF III MINI PEN NEEDLES 31G X 5 MM MISC, , Disp: , Rfl:  .  glucose blood test strip, 1 each by Other route as needed for other. Use as instructed, Disp: , Rfl:  .  isosorbide mononitrate (IMDUR) 60 MG 24 hr tablet, Take 1 tablet (60 mg total) by mouth daily. (Patient taking differently: Take 30 mg by mouth daily. ), Disp: 30 tablet, Rfl: 1 .  latanoprost (XALATAN) 0.005 % ophthalmic solution, Place 1 drop into both eyes at bedtime. , Disp: , Rfl:  .  Liraglutide (VICTOZA [place]), Inject 0.6 mg into the skin daily. , Disp: , Rfl:  .  LUMIGAN 0.01 % SOLN, Place 1 drop into both eyes at bedtime. , Disp: , Rfl:   .  metFORMIN (GLUCOPHAGE) 1000 MG tablet, Take 0.5 tablets (500 mg total) by mouth daily., Disp: , Rfl:  .  Multiple Vitamins-Minerals (MULTIVITAMIN WITH MINERALS) tablet, Take 1 tablet by mouth daily., Disp: , Rfl:  .  pioglitazone (ACTOS) 30 MG tablet, Take 15 mg by mouth daily., Disp: , Rfl:  .  promethazine-phenylephrine (PROMETHAZINE VC) 6.25-5 MG/5ML SYRP, Take 5 mLs by mouth every 8 (eight) hours., Disp: , Rfl: 1 .  telmisartan (MICARDIS) 80 MG tablet, Take 40 mg by mouth daily. , Disp: , Rfl:  .  triamcinolone ointment (KENALOG) 0.1 %, Apply 1 application topically 2 (two) times daily., Disp: , Rfl: 11 .  glipiZIDE (GLUCOTROL) 5 MG tablet, Take 0.5 tablets (2.5 mg total) by mouth daily. (Patient not taking: Reported on 06/14/2017), Disp: , Rfl:  .  hydrochlorothiazide (HYDRODIURIL) 25 MG tablet, , Disp: , Rfl:  .  HYDROcodone-homatropine (HYCODAN) 5-1.5 MG/5ML syrup, Take 5 mLs by mouth every 6 (six) hours as needed for cough. (Patient not taking: Reported on 06/14/2017), Disp: 180 mL, Rfl: 0 .  mupirocin ointment (BACTROBAN) 2 %, Apply 1 application topically daily as needed (sores). , Disp: , Rfl:   Vitals:   06/14/17 1420  BP: (!) 115/58  Pulse: 92  Resp: 18  Temp: (!) 97.3 F (36.3 C)  TempSrc: Oral  SpO2: 98%  Weight: 215 lb (97.5 kg)  Height: 5\' 4"  (1.626 m)    Body mass index is 36.9 kg/m.         Review of Systems Has history of congestive heart failure with mild dyspnea on exertion. Denies chest pain or claudication.    Objective:   Physical Exam BP (!) 115/58 (BP Location: Left Arm, Patient Position: Sitting, Cuff Size: Large)   Pulse 92   Temp (!) 97.3 F (36.3 C) (Oral)   Resp 18   Ht 5\' 4"  (1.626 m)   Wt 215 lb (97.5 kg)   SpO2 98%   BMI 36.90 kg/m   Gen. elderly spry female no apparent distress alert and oriented 3 Lungs no rhonchi or wheezing Left leg with no bulging varicosities noted. Minimal distal edema. Reticular veins in the malleolar  area medially. 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

## 2017-06-18 DIAGNOSIS — E119 Type 2 diabetes mellitus without complications: Secondary | ICD-10-CM | POA: Diagnosis not present

## 2017-06-18 DIAGNOSIS — H5213 Myopia, bilateral: Secondary | ICD-10-CM | POA: Diagnosis not present

## 2017-06-18 DIAGNOSIS — H43813 Vitreous degeneration, bilateral: Secondary | ICD-10-CM | POA: Diagnosis not present

## 2017-06-18 DIAGNOSIS — H2513 Age-related nuclear cataract, bilateral: Secondary | ICD-10-CM | POA: Diagnosis not present

## 2017-06-22 DIAGNOSIS — Z6837 Body mass index (BMI) 37.0-37.9, adult: Secondary | ICD-10-CM | POA: Diagnosis not present

## 2017-06-22 DIAGNOSIS — M545 Low back pain: Secondary | ICD-10-CM | POA: Diagnosis not present

## 2017-06-22 DIAGNOSIS — I10 Essential (primary) hypertension: Secondary | ICD-10-CM | POA: Diagnosis not present

## 2017-06-22 DIAGNOSIS — M5136 Other intervertebral disc degeneration, lumbar region: Secondary | ICD-10-CM | POA: Diagnosis not present

## 2017-06-28 DIAGNOSIS — I10 Essential (primary) hypertension: Secondary | ICD-10-CM | POA: Diagnosis not present

## 2017-06-28 DIAGNOSIS — Z79899 Other long term (current) drug therapy: Secondary | ICD-10-CM | POA: Diagnosis not present

## 2017-06-28 DIAGNOSIS — E119 Type 2 diabetes mellitus without complications: Secondary | ICD-10-CM | POA: Diagnosis not present

## 2017-07-01 DIAGNOSIS — H25011 Cortical age-related cataract, right eye: Secondary | ICD-10-CM | POA: Diagnosis not present

## 2017-07-01 DIAGNOSIS — H2511 Age-related nuclear cataract, right eye: Secondary | ICD-10-CM | POA: Diagnosis not present

## 2017-07-01 DIAGNOSIS — H25811 Combined forms of age-related cataract, right eye: Secondary | ICD-10-CM | POA: Diagnosis not present

## 2017-07-15 DIAGNOSIS — H25012 Cortical age-related cataract, left eye: Secondary | ICD-10-CM | POA: Diagnosis not present

## 2017-07-15 DIAGNOSIS — H2512 Age-related nuclear cataract, left eye: Secondary | ICD-10-CM | POA: Diagnosis not present

## 2017-07-15 DIAGNOSIS — H59212 Accidental puncture and laceration of left eye and adnexa during an ophthalmic procedure: Secondary | ICD-10-CM | POA: Diagnosis not present

## 2017-07-15 DIAGNOSIS — H25812 Combined forms of age-related cataract, left eye: Secondary | ICD-10-CM | POA: Diagnosis not present

## 2017-08-31 DIAGNOSIS — L570 Actinic keratosis: Secondary | ICD-10-CM | POA: Diagnosis not present

## 2017-08-31 DIAGNOSIS — X32XXXD Exposure to sunlight, subsequent encounter: Secondary | ICD-10-CM | POA: Diagnosis not present

## 2017-08-31 DIAGNOSIS — D0439 Carcinoma in situ of skin of other parts of face: Secondary | ICD-10-CM | POA: Diagnosis not present

## 2017-09-03 DIAGNOSIS — H401134 Primary open-angle glaucoma, bilateral, indeterminate stage: Secondary | ICD-10-CM | POA: Diagnosis not present

## 2017-09-28 DIAGNOSIS — Z5181 Encounter for therapeutic drug level monitoring: Secondary | ICD-10-CM | POA: Diagnosis not present

## 2017-09-28 DIAGNOSIS — Z79899 Other long term (current) drug therapy: Secondary | ICD-10-CM | POA: Diagnosis not present

## 2017-10-01 DIAGNOSIS — M549 Dorsalgia, unspecified: Secondary | ICD-10-CM | POA: Diagnosis not present

## 2017-10-01 DIAGNOSIS — E119 Type 2 diabetes mellitus without complications: Secondary | ICD-10-CM | POA: Diagnosis not present

## 2017-10-01 DIAGNOSIS — I10 Essential (primary) hypertension: Secondary | ICD-10-CM | POA: Diagnosis not present

## 2017-10-05 DIAGNOSIS — H401134 Primary open-angle glaucoma, bilateral, indeterminate stage: Secondary | ICD-10-CM | POA: Diagnosis not present

## 2017-10-06 DIAGNOSIS — D0439 Carcinoma in situ of skin of other parts of face: Secondary | ICD-10-CM | POA: Diagnosis not present

## 2017-11-02 ENCOUNTER — Emergency Department (HOSPITAL_COMMUNITY): Payer: Medicare Other

## 2017-11-02 ENCOUNTER — Inpatient Hospital Stay (HOSPITAL_COMMUNITY)
Admission: EM | Admit: 2017-11-02 | Discharge: 2017-11-04 | DRG: 872 | Disposition: A | Discharge status: Home / Self Care | Payer: Medicare Other | Attending: Internal Medicine | Admitting: Internal Medicine

## 2017-11-02 ENCOUNTER — Encounter (HOSPITAL_COMMUNITY): Payer: Self-pay | Admitting: Emergency Medicine

## 2017-11-02 DIAGNOSIS — I272 Pulmonary hypertension, unspecified: Secondary | ICD-10-CM | POA: Diagnosis present

## 2017-11-02 DIAGNOSIS — I5032 Chronic diastolic (congestive) heart failure: Secondary | ICD-10-CM | POA: Diagnosis not present

## 2017-11-02 DIAGNOSIS — Z7982 Long term (current) use of aspirin: Secondary | ICD-10-CM

## 2017-11-02 DIAGNOSIS — M545 Low back pain: Secondary | ICD-10-CM | POA: Diagnosis present

## 2017-11-02 DIAGNOSIS — E1122 Type 2 diabetes mellitus with diabetic chronic kidney disease: Secondary | ICD-10-CM | POA: Diagnosis not present

## 2017-11-02 DIAGNOSIS — Z9981 Dependence on supplemental oxygen: Secondary | ICD-10-CM

## 2017-11-02 DIAGNOSIS — Z79899 Other long term (current) drug therapy: Secondary | ICD-10-CM

## 2017-11-02 DIAGNOSIS — I130 Hypertensive heart and chronic kidney disease with heart failure and stage 1 through stage 4 chronic kidney disease, or unspecified chronic kidney disease: Secondary | ICD-10-CM | POA: Diagnosis present

## 2017-11-02 DIAGNOSIS — N183 Chronic kidney disease, stage 3 unspecified: Secondary | ICD-10-CM | POA: Diagnosis present

## 2017-11-02 DIAGNOSIS — R000 Tachycardia, unspecified: Secondary | ICD-10-CM | POA: Diagnosis not present

## 2017-11-02 DIAGNOSIS — A419 Sepsis, unspecified organism: Principal | ICD-10-CM | POA: Diagnosis present

## 2017-11-02 DIAGNOSIS — E1129 Type 2 diabetes mellitus with other diabetic kidney complication: Secondary | ICD-10-CM | POA: Diagnosis present

## 2017-11-02 DIAGNOSIS — E785 Hyperlipidemia, unspecified: Secondary | ICD-10-CM | POA: Diagnosis present

## 2017-11-02 DIAGNOSIS — J449 Chronic obstructive pulmonary disease, unspecified: Secondary | ICD-10-CM | POA: Diagnosis present

## 2017-11-02 DIAGNOSIS — J9811 Atelectasis: Secondary | ICD-10-CM | POA: Diagnosis not present

## 2017-11-02 DIAGNOSIS — R0902 Hypoxemia: Secondary | ICD-10-CM | POA: Diagnosis not present

## 2017-11-02 DIAGNOSIS — R52 Pain, unspecified: Secondary | ICD-10-CM | POA: Diagnosis not present

## 2017-11-02 DIAGNOSIS — R109 Unspecified abdominal pain: Secondary | ICD-10-CM | POA: Diagnosis not present

## 2017-11-02 DIAGNOSIS — L03115 Cellulitis of right lower limb: Secondary | ICD-10-CM | POA: Diagnosis not present

## 2017-11-02 DIAGNOSIS — H409 Unspecified glaucoma: Secondary | ICD-10-CM | POA: Diagnosis present

## 2017-11-02 DIAGNOSIS — M5489 Other dorsalgia: Secondary | ICD-10-CM | POA: Diagnosis not present

## 2017-11-02 DIAGNOSIS — Z7984 Long term (current) use of oral hypoglycemic drugs: Secondary | ICD-10-CM

## 2017-11-02 DIAGNOSIS — I10 Essential (primary) hypertension: Secondary | ICD-10-CM | POA: Diagnosis present

## 2017-11-02 DIAGNOSIS — E119 Type 2 diabetes mellitus without complications: Secondary | ICD-10-CM | POA: Diagnosis present

## 2017-11-02 DIAGNOSIS — Z9049 Acquired absence of other specified parts of digestive tract: Secondary | ICD-10-CM

## 2017-11-02 LAB — COMPREHENSIVE METABOLIC PANEL
ALK PHOS: 75 U/L (ref 38–126)
ALT: 12 U/L (ref 0–44)
ANION GAP: 10 (ref 5–15)
AST: 19 U/L (ref 15–41)
Albumin: 3.7 g/dL (ref 3.5–5.0)
BUN: 23 mg/dL (ref 8–23)
CHLORIDE: 101 mmol/L (ref 98–111)
CO2: 26 mmol/L (ref 22–32)
Calcium: 8.9 mg/dL (ref 8.9–10.3)
Creatinine, Ser: 1.15 mg/dL — ABNORMAL HIGH (ref 0.44–1.00)
GFR calc non Af Amer: 42 mL/min — ABNORMAL LOW (ref >60)
GFR, EST AFRICAN AMERICAN: 49 mL/min — AB (ref >60)
Glucose, Bld: 167 mg/dL — ABNORMAL HIGH (ref 70–99)
Potassium: 4.5 mmol/L (ref 3.5–5.1)
SODIUM: 137 mmol/L (ref 135–145)
Total Bilirubin: 0.7 mg/dL (ref 0.3–1.2)
Total Protein: 6 g/dL — ABNORMAL LOW (ref 6.5–8.1)

## 2017-11-02 LAB — I-STAT CG4 LACTIC ACID, ED: LACTIC ACID, VENOUS: 1.44 mmol/L (ref 0.5–1.9)

## 2017-11-02 MED ORDER — PIPERACILLIN-TAZOBACTAM 3.375 G IVPB 30 MIN
3.3750 g | Freq: Once | INTRAVENOUS | Status: AC
  Administered 2017-11-03: 3.375 g via INTRAVENOUS
  Filled 2017-11-02: qty 50

## 2017-11-02 MED ORDER — SODIUM CHLORIDE 0.9 % IV BOLUS
1000.0000 mL | Freq: Once | INTRAVENOUS | Status: AC
  Administered 2017-11-02: 1000 mL via INTRAVENOUS

## 2017-11-02 MED ORDER — VANCOMYCIN HCL IN DEXTROSE 1-5 GM/200ML-% IV SOLN
1000.0000 mg | Freq: Once | INTRAVENOUS | Status: AC
  Administered 2017-11-03: 1000 mg via INTRAVENOUS
  Filled 2017-11-02: qty 200

## 2017-11-02 MED ORDER — ACETAMINOPHEN 325 MG PO TABS
650.0000 mg | ORAL_TABLET | Freq: Once | ORAL | Status: AC
  Administered 2017-11-03: 650 mg via ORAL
  Filled 2017-11-02: qty 2

## 2017-11-02 NOTE — ED Provider Notes (Signed)
Medical screening examination/treatment/procedure(s) were conducted as a shared visit with non-physician practitioner(s) and myself.  I personally evaluated the patient during the encounter.  Clinical Impression:   Final diagnoses:  Sepsis, due to unspecified organism [facility])  Cellulitis of right leg    Patient is a 84 year old female, very pleasant but unfortunately presents with what appears to be sepsis, this is likely from a cellulitis of her leg.  She has symptoms including redness, swelling of her leg, fever, tachycardia and a leukocytosis of 15,000.  Abdomen is soft and nontender, lungs are clear, she does require some oxygen at night and is currently using it.  She has been complaining of back pain, imaging shows n oobvious infiltrate, UA pending,  CC provided, see separate note by APP [place] with abx, for early sepsis - no signs of shock at this point. Fluids Antibiotics.    Noemi Chapel, MD 11/05/17 [phone]

## 2017-11-02 NOTE — ED Provider Notes (Signed)
[facility] EMERGENCY DEPARTMENT Provider Note   CSN: [id number] Arrival date & time: 11/02/17  2157    History   Chief Complaint Chief Complaint  Patient presents with  . Back Pain    HPI DARALYN BERT is a 84 y.o. female.  84 y/o female with hx of HTN, DM, HLD, COPD (on oxygen at night), CHF (EF 60-65% on echo in 2018) presents to the ED for c/o flank pain. Patient reports sudden onset of sharp right flank pain ~6pm which was preceded by bilateral lower extremity pain and body aches. Pain has remained constant. It does not radiate. Patient endorses "shaking", sounding c/w rigors, as well. She took 2 tylenol around the onset of symptoms and was subsequently given 2 aspirin. She notes improvement in pain after fentanyl given by EMS. Patient also given 4mg  zofran for c/o nausea. She has had no sick contacts, chest pain, vomiting, diarrhea, abdominal pain, dysuria, hematuria. Abdominal surgical hx significant for cholecystectomy, hernia repair.     Past Medical History:  Diagnosis Date  . CHF (congestive heart failure) ([place])   . COPD (chronic obstructive pulmonary disease) ([place])   . Diabetes mellitus   . Glaucoma   . Hyperlipidemia   . Hypertension     Patient Active Problem List   Diagnosis Date Noted  . Essential hypertension 11/03/2017  . Sepsis ([place]) 11/03/2017  . Type II diabetes mellitus with renal manifestations ([place]) 11/03/2017  . Chronic diastolic CHF (congestive heart failure) ([place]) 03/19/2017  . Diabetes mellitus ([place]) 03/19/2017  . Obesity, diabetes, and hypertension syndrome ([place]) 03/19/2017  . CKD (chronic kidney disease) stage 3, GFR 30-59 ml/min (HCC) 03/19/2017  . Syncope 10/24/2016  . Pulmonary hypertension ([place]) 12/07/2015  . Varicose veins of leg with complications 28/36/6294  . Varicose veins of left lower extremity with complications 76/54/6503  . Varicose veins of lower extremities with other complications 54/65/6812  . Lipoma of  back 07/19/2012    Past Surgical History:  Procedure Laterality Date  . BACK SURGERY    . CARDIAC CATHETERIZATION N/A 12/09/2015   Procedure: Right Heart Cath;  Surgeon: Adrian Prows, MD;  Location: [facility];  Service: Cardiovascular;  Laterality: N/A;  . CHOLECYSTECTOMY  2000  . HERNIA REPAIR  2007  . rupture disk  1970"s  . SHOULDER ARTHROSCOPY WITH ROTATOR CUFF REPAIR  1999   rt shoulder     OB History   None      Home Medications    Prior to Admission medications   Medication Sig Start Date End Date Taking? Authorizing Provider  acetaminophen (TYLENOL) 650 MG CR tablet Take 650 mg by mouth every 8 (eight) hours as needed for pain.   Yes [provider]  amLODipine (NORVASC) 2.5 MG tablet TK 1 T PO QD FOR BP 06/09/17  Yes [provider]  aspirin EC 81 MG tablet Take 81 mg by mouth daily.   Yes [provider]  brinzolamide (AZOPT) 1 % ophthalmic suspension Place 1 drop into both eyes 2 (two) times daily.   Yes [provider]  isosorbide mononitrate (IMDUR) 60 MG 24 hr tablet Take 1 tablet (60 mg total) by mouth daily. 10/27/16  Yes [name], MD  Liraglutide (VICTOZA [place]) Inject 0.6 mg into the skin daily.    Yes [provider]  LUMIGAN 0.01 % SOLN Place 1 drop into both eyes at bedtime.  12/28/16  Yes [provider]  metFORMIN (GLUCOPHAGE) 1000 MG tablet Take 0.5  tablets (500 mg total) by mouth daily. 10/27/16  Yes [name], MD  mupirocin ointment (BACTROBAN) 2 % Apply 1 application topically daily as needed (sores).  01/18/17  Yes [provider]  pioglitazone (ACTOS) 30 MG tablet Take 15 mg by mouth daily.   Yes [provider]  promethazine-phenylephrine (PROMETHAZINE VC) 6.25-5 MG/5ML SYRP Take 5 mLs by mouth every 8 (eight) hours as needed for congestion.  03/16/17  Yes [provider]  telmisartan (MICARDIS) 80 MG tablet Take 40 mg by mouth daily.    Yes [provider]    triamcinolone ointment (KENALOG) 0.1 % Apply 1 application topically 2 (two) times daily as needed (rash).  03/01/17  Yes [provider]  glipiZIDE (GLUCOTROL) 5 MG tablet Take 0.5 tablets (2.5 mg total) by mouth daily. Patient not taking: Reported on 06/14/2017 10/27/16   [name], MD  HYDROcodone-homatropine [facility]) 5-1.5 MG/5ML syrup Take 5 mLs by mouth every 6 (six) hours as needed for cough. Patient not taking: Reported on 06/14/2017 03/21/17   [name], MD    Family History Family History  Problem Relation Age of Onset  . Stroke Mother     Social History Social History   Tobacco Use  . Smoking status: Never Smoker  . Smokeless tobacco: Never Used  Substance Use Topics  . Alcohol use: No  . Drug use: No     Allergies   Patient has no known allergies.   Review of Systems Review of Systems Ten systems reviewed and are negative for acute change, except as noted in the HPI.    Physical Exam Updated Vital Signs BP (!) 115/49   Pulse (!) 101   Temp (!) 101.6 F (38.7 C) (Rectal)   Resp 16   Ht 5\' 4"  (1.626 m)   Wt 102.1 kg (225 lb)   SpO2 98%   BMI 38.62 kg/m   Physical Exam  Constitutional: She is oriented to person, place, and time. She appears well-developed and well-nourished. No distress.  Obese, pleasant.   HENT:  Head: Normocephalic and atraumatic.  Eyes: Conjunctivae and EOM are normal. No scleral icterus.  Neck: Normal range of motion.  Cardiovascular: Regular rhythm and intact distal pulses. Tachycardia present.  Murmur heard. Pulmonary/Chest: Effort normal. No stridor. No respiratory distress.  Chest expansion symmetric. SpO2 98% on 2L via [place]. Lungs grossly clear. No wheezes or rhonchi.  Abdominal: Soft. She exhibits no distension and no mass. There is no tenderness. There is no guarding.  Soft, obese, largely nontender.  Musculoskeletal: Normal range of motion.  Neurological: She is alert and oriented to person, place, and  time. She exhibits normal muscle tone. Coordination normal.  GCS 15. Speech is goal oriented. Patient moving all   Skin: Skin is warm and dry. No rash noted. She is not diaphoretic. No pallor.     There is erythema to the RLE originating at the ankle and extending proximally. Does not include the knee. No associated pitting edema. Mild heat to touch.  Psychiatric: She has a normal mood and affect. Her behavior is normal.  Nursing note and vitals reviewed.    ED Treatments / Results  Labs (all labs ordered are listed, but only abnormal results are displayed) Labs Reviewed  COMPREHENSIVE METABOLIC PANEL - Abnormal; Notable for the following components:      Result Value   Glucose, Bld 167 (*)    Creatinine, Ser 1.15 (*)    Total Protein 6.0 (*)    GFR  calc non Af Amer 42 (*)    GFR calc Af Amer 49 (*)    All other components within normal limits  CBC WITH DIFFERENTIAL/PLATELET - Abnormal; Notable for the following components:   WBC 15.1 (*)    RBC 3.75 (*)    Hemoglobin 10.8 (*)    HCT 34.9 (*)    Neutro Abs 13.6 (*)    Lymphs Abs 0.5 (*)    All other components within normal limits  URINALYSIS, ROUTINE W REFLEX MICROSCOPIC - Abnormal; Notable for the following components:   Leukocytes, UA SMALL (*)    All other components within normal limits  CULTURE, BLOOD (ROUTINE X 2)  CULTURE, BLOOD (ROUTINE X 2)  URINE CULTURE  I-STAT CG4 LACTIC ACID, ED  I-STAT CG4 LACTIC ACID, ED    EKG EKG Interpretation  Date/Time:  Tuesday November 02 2017 23:01:01 EDT Ventricular Rate:  109 PR Interval:    QRS Duration: 84 QT Interval:  361 QTC Calculation: 487 R Axis:   -61 Text Interpretation:  Sinus or ectopic atrial tachycardia Left anterior fascicular block Abnormal R-wave progression, late transition Borderline prolonged QT interval since last tracing no significant change Confirmed by Noemi Chapel [phone]) on 11/02/2017 11:34:40 PM   Radiology Dg Chest 2 View  Result Date:  11/02/2017 CLINICAL DATA:  Low back pain radiating to RIGHT flank. Shortness of breath. History of CHF and COPD. EXAM: CHEST - 2 VIEW COMPARISON:  Chest radiograph March 19, 2017 FINDINGS: Cardiomediastinal silhouette is unremarkable for this low inspiratory examination with crowded vasculature markings. Calcified aortic arch. Mild interstitial prominence. Bibasilar strandy densities. The lungs are otherwise clear without pleural effusions or focal consolidations. Trachea projects midline and there is no pneumothorax. Included soft tissue planes and osseous structures are non-suspicious. Osteopenia. Suture anchors RIGHT humeral head. Moderate spondylosis. IMPRESSION: Interstitial prominence seen with pulmonary edema or atypical infection. Bibasilar atelectasis/scarring.  No focal consolidation. Aortic Atherosclerosis (ICD10-I70.0). Electronically Signed   By: Elon Alas M.D.   On: 11/02/2017 23:02    Procedures Procedures (including critical care time)  Medications Ordered in ED Medications  vancomycin (VANCOCIN) IVPB 1000 mg/200 mL premix (1,000 mg Intravenous New Bag/Given 11/03/17 0136)  piperacillin-tazobactam (ZOSYN) IVPB 3.375 g (has no administration in time range)  vancomycin (VANCOCIN) 1,250 mg in sodium chloride 0.9 % 250 mL IVPB (has no administration in time range)  sodium chloride 0.9 % bolus 1,000 mL (1,000 mLs Intravenous New Bag/Given 11/03/17 0137)  sodium chloride 0.9 % bolus 1,000 mL (0 mLs Intravenous Stopped 11/03/17 0136)  acetaminophen (TYLENOL) tablet 650 mg (650 mg Oral Given 11/03/17 0004)  piperacillin-tazobactam (ZOSYN) IVPB 3.375 g (0 g Intravenous Stopped 11/03/17 0134)  vancomycin (VANCOCIN) IVPB 1000 mg/200 mL premix (0 mg Intravenous Stopped 11/03/17 0134)    CRITICAL CARE Performed by: Antonietta Breach   Total critical care time: 35 minutes  Critical care time was exclusive of separately billable procedures and treating other patients.  Critical care was  necessary to treat or prevent imminent or life-threatening deterioration.  Critical care was time spent personally by me on the following activities: development of treatment plan with patient and/or surrogate as well as nursing, discussions with consultants, evaluation of patient's response to treatment, examination of patient, obtaining history from patient or surrogate, ordering and performing treatments and interventions, ordering and review of laboratory studies, ordering and review of radiographic studies, pulse oximetry and re-evaluation of patient's condition.   Initial Impression / Assessment and Plan / ED Course  I have  reviewed the triage vital signs and the nursing notes.  Pertinent labs & imaging results that were available during my care of the patient were reviewed by me and considered in my medical decision making (see chart for details).     84 year old female presents for lower extremity pain with myalgias, rigors.  She notes some right-sided flank pain which improved with fentanyl.  There was initial concern for lower lobe pneumonia or pyelonephritis given history, though urinalysis is reassuring and x-ray without clear evidence of focal consolidation.  The patient does have increased right lower extremity erythema.  Some early lymphangitic streaking.  She is neurovascularly intact, though there is concern for cellulitis.  Patient meets sepsis criteria and was started on vancomycin and Zosyn.  Will admit to [facility] for continued management.  Final Clinical Impressions(s) / ED Diagnoses   Final diagnoses:  Sepsis, due to unspecified organism [facility])  Cellulitis of right leg    ED Discharge Orders    None       Antonietta Breach, PA-C 11/03/17 0154    Noemi Chapel, MD 11/05/17 [phone]

## 2017-11-02 NOTE — ED Triage Notes (Signed)
Per EMS pt from home, lower back pain to R flank, also redness and pain to R calf, pt also felt nauseous earlier, pt normally on 2L oxygen to sleep, pt was 88% initially on room air. 50 mcg fentanyl and 4mg  zofran PTA. No vomiting or diarrhea.

## 2017-11-03 ENCOUNTER — Inpatient Hospital Stay (HOSPITAL_COMMUNITY): Payer: Medicare Other

## 2017-11-03 DIAGNOSIS — E1122 Type 2 diabetes mellitus with diabetic chronic kidney disease: Secondary | ICD-10-CM | POA: Diagnosis present

## 2017-11-03 DIAGNOSIS — L03115 Cellulitis of right lower limb: Secondary | ICD-10-CM

## 2017-11-03 DIAGNOSIS — N183 Chronic kidney disease, stage 3 (moderate): Secondary | ICD-10-CM

## 2017-11-03 DIAGNOSIS — I5032 Chronic diastolic (congestive) heart failure: Secondary | ICD-10-CM | POA: Diagnosis not present

## 2017-11-03 DIAGNOSIS — H409 Unspecified glaucoma: Secondary | ICD-10-CM | POA: Diagnosis present

## 2017-11-03 DIAGNOSIS — L0390 Cellulitis, unspecified: Secondary | ICD-10-CM | POA: Diagnosis not present

## 2017-11-03 DIAGNOSIS — A419 Sepsis, unspecified organism: Secondary | ICD-10-CM | POA: Diagnosis not present

## 2017-11-03 DIAGNOSIS — E1129 Type 2 diabetes mellitus with other diabetic kidney complication: Secondary | ICD-10-CM | POA: Diagnosis present

## 2017-11-03 DIAGNOSIS — I10 Essential (primary) hypertension: Secondary | ICD-10-CM | POA: Diagnosis not present

## 2017-11-03 DIAGNOSIS — Z79899 Other long term (current) drug therapy: Secondary | ICD-10-CM | POA: Diagnosis not present

## 2017-11-03 DIAGNOSIS — I272 Pulmonary hypertension, unspecified: Secondary | ICD-10-CM | POA: Diagnosis present

## 2017-11-03 DIAGNOSIS — E119 Type 2 diabetes mellitus without complications: Secondary | ICD-10-CM | POA: Diagnosis present

## 2017-11-03 DIAGNOSIS — R109 Unspecified abdominal pain: Secondary | ICD-10-CM | POA: Diagnosis present

## 2017-11-03 DIAGNOSIS — Z9981 Dependence on supplemental oxygen: Secondary | ICD-10-CM | POA: Diagnosis not present

## 2017-11-03 DIAGNOSIS — E785 Hyperlipidemia, unspecified: Secondary | ICD-10-CM | POA: Diagnosis present

## 2017-11-03 DIAGNOSIS — Z9049 Acquired absence of other specified parts of digestive tract: Secondary | ICD-10-CM | POA: Diagnosis not present

## 2017-11-03 DIAGNOSIS — Z7982 Long term (current) use of aspirin: Secondary | ICD-10-CM | POA: Diagnosis not present

## 2017-11-03 DIAGNOSIS — I130 Hypertensive heart and chronic kidney disease with heart failure and stage 1 through stage 4 chronic kidney disease, or unspecified chronic kidney disease: Secondary | ICD-10-CM | POA: Diagnosis present

## 2017-11-03 DIAGNOSIS — M545 Low back pain: Secondary | ICD-10-CM | POA: Diagnosis present

## 2017-11-03 DIAGNOSIS — Z7984 Long term (current) use of oral hypoglycemic drugs: Secondary | ICD-10-CM | POA: Diagnosis not present

## 2017-11-03 DIAGNOSIS — J449 Chronic obstructive pulmonary disease, unspecified: Secondary | ICD-10-CM | POA: Diagnosis present

## 2017-11-03 LAB — CBC WITH DIFFERENTIAL/PLATELET
ABS IMMATURE GRANULOCYTES: 0.1 10*3/uL (ref 0.0–0.1)
Basophils Absolute: 0.1 10*3/uL (ref 0.0–0.1)
Basophils Relative: 0 %
Eosinophils Absolute: 0.1 10*3/uL (ref 0.0–0.7)
Eosinophils Relative: 0 %
HEMATOCRIT: 34.9 % — AB (ref 36.0–46.0)
Hemoglobin: 10.8 g/dL — ABNORMAL LOW (ref 12.0–15.0)
IMMATURE GRANULOCYTES: 0 %
LYMPHS ABS: 0.5 10*3/uL — AB (ref 0.7–4.0)
Lymphocytes Relative: 4 %
MCH: 28.8 pg (ref 26.0–34.0)
MCHC: 30.9 g/dL (ref 30.0–36.0)
MCV: 93.1 fL (ref 78.0–100.0)
MONOS PCT: 5 %
Monocytes Absolute: 0.8 10*3/uL (ref 0.1–1.0)
NEUTROS ABS: 13.6 10*3/uL — AB (ref 1.7–7.7)
NEUTROS PCT: 91 %
PLATELETS: DECREASED 10*3/uL (ref 150–400)
RBC: 3.75 MIL/uL — ABNORMAL LOW (ref 3.87–5.11)
RDW: 13.6 % (ref 11.5–15.5)
WBC: 15.1 10*3/uL — ABNORMAL HIGH (ref 4.0–10.5)

## 2017-11-03 LAB — URINALYSIS, ROUTINE W REFLEX MICROSCOPIC
BILIRUBIN URINE: NEGATIVE
Bacteria, UA: NONE SEEN
GLUCOSE, UA: NEGATIVE mg/dL
HGB URINE DIPSTICK: NEGATIVE
Ketones, ur: NEGATIVE mg/dL
NITRITE: NEGATIVE
Protein, ur: NEGATIVE mg/dL
SPECIFIC GRAVITY, URINE: 1.018 (ref 1.005–1.030)
pH: 5 (ref 5.0–8.0)

## 2017-11-03 LAB — CBC
HCT: 31 % — ABNORMAL LOW (ref 36.0–46.0)
HEMOGLOBIN: 9.6 g/dL — AB (ref 12.0–15.0)
MCH: 29.4 pg (ref 26.0–34.0)
MCHC: 31 g/dL (ref 30.0–36.0)
MCV: 94.8 fL (ref 78.0–100.0)
Platelets: UNDETERMINED 10*3/uL (ref 150–400)
RBC: 3.27 MIL/uL — ABNORMAL LOW (ref 3.87–5.11)
RDW: 13.6 % (ref 11.5–15.5)
WBC: 17.1 10*3/uL — ABNORMAL HIGH (ref 4.0–10.5)

## 2017-11-03 LAB — SEDIMENTATION RATE: Sed Rate: 26 mm/hr — ABNORMAL HIGH (ref 0–22)

## 2017-11-03 LAB — PROCALCITONIN: Procalcitonin: <0.1 ng/mL

## 2017-11-03 LAB — GLUCOSE, CAPILLARY
GLUCOSE-CAPILLARY: 147 mg/dL — AB (ref 70–99)
Glucose-Capillary: 133 mg/dL — ABNORMAL HIGH (ref 70–99)
Glucose-Capillary: 120 mg/dL — ABNORMAL HIGH (ref 70–99)
Glucose-Capillary: 113 mg/dL — ABNORMAL HIGH (ref 70–99)
Glucose-Capillary: 138 mg/dL — ABNORMAL HIGH (ref 70–99)

## 2017-11-03 LAB — BASIC METABOLIC PANEL
Anion gap: 10 (ref 5–15)
BUN: 22 mg/dL (ref 8–23)
CO2: 25 mmol/L (ref 22–32)
CREATININE: 1.17 mg/dL — AB (ref 0.44–1.00)
Calcium: 8.2 mg/dL — ABNORMAL LOW (ref 8.9–10.3)
Chloride: 101 mmol/L (ref 98–111)
GFR calc Af Amer: 48 mL/min — ABNORMAL LOW (ref >60)
GFR, EST NON AFRICAN AMERICAN: 41 mL/min — AB (ref >60)
GLUCOSE: 163 mg/dL — AB (ref 70–99)
Potassium: 4.4 mmol/L (ref 3.5–5.1)
SODIUM: 136 mmol/L (ref 135–145)

## 2017-11-03 LAB — BRAIN NATRIURETIC PEPTIDE: B Natriuretic Peptide: 258.7 pg/mL — ABNORMAL HIGH (ref 0.0–100.0)

## 2017-11-03 LAB — LIPASE, BLOOD: LIPASE: 32 U/L (ref 11–51)

## 2017-11-03 LAB — C-REACTIVE PROTEIN

## 2017-11-03 MED ORDER — ACETAMINOPHEN 650 MG RE SUPP: 650.0000 mg | Freq: Four times a day (QID) | RECTAL | Status: DC | PRN

## 2017-11-03 MED ORDER — ENOXAPARIN SODIUM 40 MG/0.4ML ~~LOC~~ SOLN
40.0000 mg | SUBCUTANEOUS | Status: DC
  Administered 2017-11-03 – 2017-11-04 (×2): 40 mg via SUBCUTANEOUS
  Filled 2017-11-03 (×2): qty 0.4

## 2017-11-03 MED ORDER — ISOSORBIDE MONONITRATE ER 60 MG PO TB24
60.0000 mg | ORAL_TABLET | Freq: Every day | ORAL | Status: DC
  Administered 2017-11-03 – 2017-11-04 (×2): 60 mg via ORAL
  Filled 2017-11-03 (×2): qty 1

## 2017-11-03 MED ORDER — CEFTRIAXONE SODIUM 2 G IJ SOLR
2.0000 g | INTRAMUSCULAR | Status: DC
  Administered 2017-11-03 – 2017-11-04 (×2): 2 g via INTRAVENOUS
  Filled 2017-11-03 (×2): qty 20

## 2017-11-03 MED ORDER — LEVALBUTEROL HCL 1.25 MG/0.5ML IN NEBU
1.2500 mg | INHALATION_SOLUTION | Freq: Four times a day (QID) | RESPIRATORY_TRACT | Status: DC
  Filled 2017-11-03 (×2): qty 0.5

## 2017-11-03 MED ORDER — OXYCODONE-ACETAMINOPHEN 5-325 MG PO TABS
1.0000 | ORAL_TABLET | ORAL | Status: DC | PRN
  Administered 2017-11-03: 1 via ORAL
  Filled 2017-11-03: qty 1

## 2017-11-03 MED ORDER — HYDRALAZINE HCL 20 MG/ML IJ SOLN: 5.0000 mg | INTRAMUSCULAR | Status: DC | PRN

## 2017-11-03 MED ORDER — ONDANSETRON HCL 4 MG/2ML IJ SOLN: 4.0000 mg | Freq: Four times a day (QID) | INTRAMUSCULAR | Status: DC | PRN

## 2017-11-03 MED ORDER — SODIUM CHLORIDE 0.9 % IV SOLN
INTRAVENOUS | Status: DC
Start: 2017-11-03 — End: 2017-11-04
  Administered 2017-11-03: 07:00:00 via INTRAVENOUS

## 2017-11-03 MED ORDER — TRIAMCINOLONE ACETONIDE 0.1 % EX OINT
1.0000 "application " | TOPICAL_OINTMENT | Freq: Two times a day (BID) | CUTANEOUS | Status: DC | PRN
  Filled 2017-11-03: qty 15

## 2017-11-03 MED ORDER — POLYETHYLENE GLYCOL 3350 17 G PO PACK: 17.0000 g | PACK | Freq: Every day | ORAL | Status: DC | PRN

## 2017-11-03 MED ORDER — PIPERACILLIN-TAZOBACTAM 3.375 G IVPB: 3.3750 g | Freq: Three times a day (TID) | INTRAVENOUS | Status: DC

## 2017-11-03 MED ORDER — PROMETHAZINE-PHENYLEPHRINE 6.25-5 MG/5ML PO SYRP
5.0000 mL | ORAL_SOLUTION | Freq: Three times a day (TID) | ORAL | Status: DC | PRN
Start: 2017-11-03 — End: 2017-11-03

## 2017-11-03 MED ORDER — LEVALBUTEROL HCL 1.25 MG/0.5ML IN NEBU: 1.2500 mg | INHALATION_SOLUTION | Freq: Four times a day (QID) | RESPIRATORY_TRACT | Status: DC | PRN

## 2017-11-03 MED ORDER — AMLODIPINE BESYLATE 5 MG PO TABS
2.5000 mg | ORAL_TABLET | Freq: Every day | ORAL | Status: DC
  Administered 2017-11-03 – 2017-11-04 (×2): 2.5 mg via ORAL
  Filled 2017-11-03 (×2): qty 1

## 2017-11-03 MED ORDER — MUPIROCIN 2 % EX OINT: 1.0000 "application " | TOPICAL_OINTMENT | Freq: Every day | CUTANEOUS | Status: DC | PRN

## 2017-11-03 MED ORDER — ZOLPIDEM TARTRATE 5 MG PO TABS: 5.0000 mg | ORAL_TABLET | Freq: Every evening | ORAL | Status: DC | PRN

## 2017-11-03 MED ORDER — INSULIN ASPART 100 UNIT/ML ~~LOC~~ SOLN
0.0000 [IU] | Freq: Three times a day (TID) | SUBCUTANEOUS | Status: DC
  Administered 2017-11-03: 1 [IU] via SUBCUTANEOUS
  Administered 2017-11-04: 2 [IU] via SUBCUTANEOUS

## 2017-11-03 MED ORDER — ONDANSETRON HCL 4 MG PO TABS: 4.0000 mg | ORAL_TABLET | Freq: Four times a day (QID) | ORAL | Status: DC | PRN

## 2017-11-03 MED ORDER — INSULIN ASPART 100 UNIT/ML ~~LOC~~ SOLN: 0.0000 [IU] | Freq: Every day | SUBCUTANEOUS | Status: DC

## 2017-11-03 MED ORDER — LATANOPROST 0.005 % OP SOLN
1.0000 [drp] | Freq: Every day | OPHTHALMIC | Status: DC
  Administered 2017-11-03: 1 [drp] via OPHTHALMIC
  Filled 2017-11-03: qty 2.5

## 2017-11-03 MED ORDER — VANCOMYCIN HCL IN DEXTROSE 1-5 GM/200ML-% IV SOLN
1000.0000 mg | Freq: Once | INTRAVENOUS | Status: AC
  Administered 2017-11-03: 1000 mg via INTRAVENOUS
  Filled 2017-11-03: qty 200

## 2017-11-03 MED ORDER — LEVALBUTEROL HCL 1.25 MG/0.5ML IN NEBU
1.2500 mg | INHALATION_SOLUTION | Freq: Four times a day (QID) | RESPIRATORY_TRACT | Status: DC
  Administered 2017-11-03: 1.25 mg via RESPIRATORY_TRACT
  Filled 2017-11-03: qty 0.5

## 2017-11-03 MED ORDER — VANCOMYCIN HCL 10 G IV SOLR: 1250.0000 mg | INTRAVENOUS | Status: DC

## 2017-11-03 MED ORDER — DM-GUAIFENESIN ER 30-600 MG PO TB12
1.0000 | ORAL_TABLET | Freq: Two times a day (BID) | ORAL | Status: DC | PRN
Start: 2017-11-03 — End: 2017-11-04
  Filled 2017-11-03: qty 1

## 2017-11-03 MED ORDER — BRINZOLAMIDE 1 % OP SUSP
1.0000 [drp] | Freq: Two times a day (BID) | OPHTHALMIC | Status: DC
  Administered 2017-11-03 – 2017-11-04 (×3): 1 [drp] via OPHTHALMIC
  Filled 2017-11-03: qty 10

## 2017-11-03 MED ORDER — ASPIRIN EC 81 MG PO TBEC
81.0000 mg | DELAYED_RELEASE_TABLET | Freq: Every day | ORAL | Status: DC
  Administered 2017-11-03 – 2017-11-04 (×2): 81 mg via ORAL
  Filled 2017-11-03 (×2): qty 1

## 2017-11-03 MED ORDER — ACETAMINOPHEN 325 MG PO TABS: 650.0000 mg | ORAL_TABLET | Freq: Four times a day (QID) | ORAL | Status: DC | PRN

## 2017-11-03 MED ORDER — SODIUM CHLORIDE 0.9 % IV BOLUS
1000.0000 mL | Freq: Once | INTRAVENOUS | Status: AC
  Administered 2017-11-03: 1000 mL via INTRAVENOUS

## 2017-11-03 NOTE — H&P (Signed)
PROGRESS NOTE    [name]  HYQ:[id number] DOB: 1930-11-06 DOA: 11/02/2017 PCP: [name], MD   Brief Narrative:  84 year old female with past medical history of hypertension hyperlipidemia, diabetes mellitus, COPD 2 L nasal cannula at home CHF came to the hospital with complains of right lower extremity pain and burning.  No other complaints.  In the ER she was febrile as well.  Her WBC was elevated at 15.1, on physical exam she was noted to have right lower extremity erythema concerning for cellulitis therefore started on IV Rocephin.    Assessment & Plan:   Principal Problem:   Cellulitis of right leg Active Problems:   Chronic diastolic CHF (congestive heart failure) (HCC)   CKD (chronic kidney disease) stage 3, GFR 30-59 ml/min (HCC)   Essential hypertension   Sepsis (HCC)   Type II diabetes mellitus with renal manifestations (HCC)   Flank pain   Sepsis secondary to right lower extremity cellulitis, nonpurulent - Patient is currently hemodynamically stable.  Cultures have been drawn - We will continue patient on IV Rocephin, provide supportive care.  Pain control - Normal saline at 75 cc/h.  Monitor urine output - Lower extremity Dopplers done, results pending  Chronic diastolic congestive heart failure -Echocardiogram done last in July 2018 showed ejection fraction 60 to 65% with grade 1 diastolic dysfunction.  Currently appears to be stable and euvolemic.  Continue to closely monitor, resume home medications  Chronic kidney disease stage III - Creatinine appears to be stable at baseline of 1.1.  Avoid nephrotoxic drugs.  Essential hypertension -Continue home medications  Diabetes mellitus type 2 - some home meds on hold.  Accu-Chek and sliding scale   DVT prophylaxis: Lovenox Code Status: Full code Family Communication: Husband at bedside Disposition Plan: Maintain inpatient stay for another 24-48 hours until improvement noted.  Consultants:   None    Procedures:   None   Antimicrobials:   Rocephin    Subjective: No complaints. Feels ok. States RLE pain is better.   Review of Systems Otherwise negative except as per HPI, including: General: Denies fever, chills, night sweats or unintended weight loss. Resp: Denies cough, wheezing, shortness of breath. Cardiac: Denies chest pain, palpitations, orthopnea, paroxysmal nocturnal dyspnea. GI: Denies abdominal pain, nausea, vomiting, diarrhea or constipation GU: Denies dysuria, frequency, hesitancy or incontinence MS: Denies muscle aches, joint pain or swelling Neuro: Denies headache, neurologic deficits (focal weakness, numbness, tingling), abnormal gait Psych: Denies anxiety, depression, SI/HI/AVH Skin: Denies new rashes or lesions ID: Denies sick contacts, exotic exposures, travel  Objective: Vitals:   11/03/17 0244 11/03/17 0305 11/03/17 0837 11/03/17 0848  BP:  (!) 118/46 (!) 104/47   Pulse:      Resp:  20 12   Temp: 97.9 F (36.6 C) 98.6 F (37 C) 97.9 F (36.6 C)   TempSrc: Oral Oral Oral   SpO2:  98% 99% 99%  Weight:  104.9 kg (231 lb 4.2 oz)    Height:  5\' 4"  (1.626 m)      Intake/Output Summary (Last 24 hours) at 11/03/2017 0913 Last data filed at 11/03/2017 0243 Gross per 24 hour  Intake 2000 ml  Output -  Net 2000 ml   Filed Weights   11/02/17 2214 11/03/17 0305  Weight: 102.1 kg (225 lb) 104.9 kg (231 lb 4.2 oz)    Examination:  General exam: Appears calm and comfortable  Respiratory system: Clear to auscultation. Respiratory effort normal. Cardiovascular system: S1 & S2 heard, RRR. No JVD,  murmurs, rubs, gallops or clicks. No pedal edema. Gastrointestinal system: Abdomen is nondistended, soft and nontender. No organomegaly or masses felt. Normal bowel sounds heard. Central nervous system: Alert and oriented. No focal neurological deficits. Extremities: Symmetric 5 x 5 power. Skin: Right LE erythema and warmth noted without any active drainage.   Psychiatry: Judgement and insight appear normal. Mood & affect appropriate.     Data Reviewed:   CBC: Recent Labs  Lab 11/02/17 2304 11/03/17 0441  WBC 15.1* 17.1*  NEUTROABS 13.6*  --   HGB 10.8* 9.6*  HCT 34.9* 31.0*  MCV 93.1 94.8  PLT PLATELET CLUMPS NOTED ON SMEAR, COUNT APPEARS DECREASED PLATELET CLUMPS NOTED ON SMEAR, UNABLE TO ESTIMATE   Basic Metabolic Panel: Recent Labs  Lab 11/02/17 2304 11/03/17 0441  NA 137 136  K 4.5 4.4  CL 101 101  CO2 26 25  GLUCOSE 167* 163*  BUN 23 22  CREATININE 1.15* 1.17*  CALCIUM 8.9 8.2*   GFR: Estimated Creatinine Clearance: 41.5 mL/min (A) (by C-G formula based on SCr of 1.17 mg/dL (H)). Liver Function Tests: Recent Labs  Lab 11/02/17 2304  AST 19  ALT 12  ALKPHOS 75  BILITOT 0.7  PROT 6.0*  ALBUMIN 3.7   Recent Labs  Lab 11/03/17 0441  LIPASE 32   No results for input(s): AMMONIA in the last 168 hours. Coagulation Profile: No results for input(s): INR, PROTIME in the last 168 hours. Cardiac Enzymes: No results for input(s): CKTOTAL, CKMB, CKMBINDEX, TROPONINI in the last 168 hours. BNP (last 3 results) No results for input(s): PROBNP in the last 8760 hours. HbA1C: No results for input(s): HGBA1C in the last 72 hours. CBG: Recent Labs  Lab 11/03/17 0308 11/03/17 0615  GLUCAP 147* 133*   Lipid Profile: No results for input(s): CHOL, HDL, LDLCALC, TRIG, CHOLHDL, LDLDIRECT in the last 72 hours. Thyroid Function Tests: No results for input(s): TSH, T4TOTAL, FREET4, T3FREE, THYROIDAB in the last 72 hours. Anemia Panel: No results for input(s): VITAMINB12, FOLATE, FERRITIN, TIBC, IRON, RETICCTPCT in the last 72 hours. Sepsis Labs: Recent Labs  Lab 11/02/17 2306 11/03/17 0210  PROCALCITON  --  <0.10  LATICACIDVEN 1.44  --     Recent Results (from the past 240 hour(s))  Blood Culture (routine x 2)     Status: None (Preliminary result)   Collection Time: 11/02/17 11:53 PM  Result Value Ref Range  Status   Specimen Description BLOOD RIGHT HAND  Final   Special Requests   Final    BOTTLES DRAWN AEROBIC AND ANAEROBIC Blood Culture adequate volume Performed at [facility], [place] [address] [place], [place] [address]    Culture PENDING  Incomplete   Report Status PENDING  Incomplete         Radiology Studies: Dg Chest 2 View  Result Date: 11/02/2017 CLINICAL DATA:  Low back pain radiating to RIGHT flank. Shortness of breath. History of CHF and COPD. EXAM: CHEST - 2 VIEW COMPARISON:  Chest radiograph March 19, 2017 FINDINGS: Cardiomediastinal silhouette is unremarkable for this low inspiratory examination with crowded vasculature markings. Calcified aortic arch. Mild interstitial prominence. Bibasilar strandy densities. The lungs are otherwise clear without pleural effusions or focal consolidations. Trachea projects midline and there is no pneumothorax. Included soft tissue planes and osseous structures are non-suspicious. Osteopenia. Suture anchors RIGHT humeral head. Moderate spondylosis. IMPRESSION: Interstitial prominence seen with pulmonary edema or atypical infection. Bibasilar atelectasis/scarring.  No focal consolidation. Aortic Atherosclerosis (ICD10-I70.0). Electronically Signed   By:  Elon Alas M.D.   On: 11/02/2017 23:02        Scheduled Meds: . aspirin EC  81 mg Oral Daily  . brinzolamide  1 drop Both Eyes BID  . enoxaparin (LOVENOX) injection  40 mg Subcutaneous Q24H  . insulin aspart  0-5 Units Subcutaneous QHS  . insulin aspart  0-9 Units Subcutaneous TID WC  . isosorbide mononitrate  60 mg Oral Daily  . latanoprost  1 drop Both Eyes QHS  . levalbuterol  1.25 mg Nebulization Q6H   Continuous Infusions: . sodium chloride 75 mL/hr at 11/03/17 0658  . cefTRIAXone (ROCEPHIN)  IV 2 g (11/03/17 0858)     LOS: 0 days    I have spent 35 minutes face to face with the patient and on the ward discussing the patients care, assessment, plan and  disposition with other care givers. >50% of the time was devoted counseling the patient about the risks and benefits of treatment and coordinating care.     Atalia Litzinger Arsenio Loader, MD [facility] Pager [phone]   If 7PM-7AM, please contact night-coverage [email or web address] Password [facility] 11/03/2017, 9:13 AM

## 2017-11-03 NOTE — ED Notes (Signed)
MD Niu at bedside  

## 2017-11-03 NOTE — Progress Notes (Signed)
Outlined reddened area to R leg w surg marker and instructed patient and husband on s/s worsening or growth of area. [place] telemetry and O2. Patient resting in room w family at bedside.

## 2017-11-03 NOTE — Progress Notes (Signed)
Pharmacy Antibiotic Note  [name] is a 84 y.o. female admitted on 11/02/2017 with cellulitis.  Pharmacy has been consulted for vancomycin and zosyn dosing. Initial doses ordered in the ED  Plan: Cont zosyn 3.375 gm IV q8 hours Give additional vanc 1 gm for toal 2gm loading dose then 1250 mg IV q24 hours F/u renal function, cultures and clinical course  Height: 5\' 4"  (162.6 cm) Weight: 225 lb (102.1 kg) IBW/kg (Calculated) : 54.7  Temp (24hrs), Avg:101.1 F (38.4 C), Min:100.5 F (38.1 C), Max:101.6 F (38.7 C)  Recent Labs  Lab 11/02/17 2304 11/02/17 2306  WBC 15.1*  --   CREATININE 1.15*  --   LATICACIDVEN  --  1.44    Estimated Creatinine Clearance: 41.6 mL/min (A) (by C-G formula based on SCr of 1.15 mg/dL (H)).    No Known Allergies  Thank you for allowing pharmacy to be a part of this patient's care.  Excell Seltzer [place] 11/03/2017 12:13 AM

## 2017-11-03 NOTE — Progress Notes (Signed)
*  Preliminary Results* Right lower extremity venous duplex completed. Right lower extremity is negative for deep vein thrombosis. There is no evidence of right Baker's cyst.  11/03/2017 9:38 AM  Maudry Mayhew, BS, RVT, RDCS, RDMS

## 2017-11-03 NOTE — H&P (Signed)
History and Physical    [name] DXA:[id number] DOB: May 07, 1930 DOA: 11/02/2017  Referring MD/NP/PA:   PCP: [name], MD   Patient coming from:  The patient is coming from home.  At baseline, pt is independent for most of ADL.   Chief Complaint: right flank pain, chills and right lower leg pain  HPI: [name] is a 84 y.o. female with medical history significant of hypertension, hyperlipidemia, diabetes mellitus, COPD on 2 L nasal cannula oxygen at home, CHF, CKD-3, who presents with right flank pain, chills and right lower leg pain.  Pt initially complains of right flank pain and also pain in lower back, but her pain has resolved when I saw patient in the ED.  Patient denies nausea, vomiting, diarrhea.  No hematuria.  He denies dysuria or burning on urination, but stating that sometimes she has increased urinary frequency.  Patient states that she has chronic erythema in the right lower leg, but no leg pain normally. Today she states that her right lower leg is more eythematous, painful and more swollen.  He does not have subjective fever, but has chills with Rigor at home. Her body temperature is 101.6 in ED. patient denies chest pain, cough.  She has mild shortness of breath due to COPD, which has not changed.  No unilateral weakness.  ED Course: pt was found to have WBC 15.1, lactic acid 1.  4 4, stable renal function, urinalysis with small amount of leukocyte, 11-20 WBC, but no bacteria.  Temperature 101.6, tachycardia, tachypnea, oxygen saturation 95% on room air.  Chest x-ray showed some interstitial prominence.  Patient is admitted to telemetry bed as inpatient.  Review of Systems:   General: has fevers, chills, no body weight gain, has poor appetite, has fatigue HEENT: no blurry vision, hearing changes or sore throat Respiratory: no dyspnea, coughing, wheezing CV: no chest pain, no palpitations GI: no nausea, vomiting, abdominal pain, diarrhea, constipation GU: no  dysuria, burning on urination, increased urinary frequency, hematuria  Ext: no leg edema Neuro: no unilateral weakness, numbness, or tingling, no vision change or hearing loss Skin:has erythema, swelling, redness and tenderness in right lower leg MSK: had right flank pain and lower back pain Heme: No easy bruising.  Travel history: No recent long distant travel.  Allergy: No Known Allergies  Past Medical History:  Diagnosis Date  . CHF (congestive heart failure) ([place])   . COPD (chronic obstructive pulmonary disease) ([place])   . Diabetes mellitus   . Glaucoma   . Hyperlipidemia   . Hypertension     Past Surgical History:  Procedure Laterality Date  . BACK SURGERY    . CARDIAC CATHETERIZATION N/A 12/09/2015   Procedure: Right Heart Cath;  Surgeon: Adrian Prows, MD;  Location: [facility];  Service: Cardiovascular;  Laterality: N/A;  . CHOLECYSTECTOMY  2000  . HERNIA REPAIR  2007  . rupture disk  1970"s  . SHOULDER ARTHROSCOPY WITH ROTATOR CUFF REPAIR  1999   rt shoulder    Social History:  reports that she has never smoked. She has never used smokeless tobacco. She reports that she does not drink alcohol or use drugs.  Family History:  Family History  Problem Relation Age of Onset  . Stroke Mother      Prior to Admission medications   Medication Sig Start Date End Date Taking? Authorizing Provider  acetaminophen (TYLENOL) 650 MG CR tablet Take 650 mg by mouth every 8 (eight) hours as needed for pain.  [provider]  amLODipine (NORVASC) 2.5 MG tablet TK 1 T PO QD FOR BP 06/09/17   [provider]  aspirin EC 81 MG tablet Take 81 mg by mouth daily.    [provider]  B-D UF III MINI PEN NEEDLES 31G X 5 MM MISC  04/07/17   [provider]  glipiZIDE (GLUCOTROL) 5 MG tablet Take 0.5 tablets (2.5 mg total) by mouth daily. Patient not taking: Reported on 06/14/2017 10/27/16   [name], MD  glucose blood test strip 1 each by Other  route as needed for other. Use as instructed    [provider]  hydrochlorothiazide (HYDRODIURIL) 25 MG tablet  04/07/17   [provider]  HYDROcodone-homatropine (HYCODAN) 5-1.5 MG/5ML syrup Take 5 mLs by mouth every 6 (six) hours as needed for cough. Patient not taking: Reported on 06/14/2017 03/21/17   [name], MD  isosorbide mononitrate (IMDUR) 60 MG 24 hr tablet Take 1 tablet (60 mg total) by mouth daily. Patient taking differently: Take 30 mg by mouth daily.  10/27/16   [name], MD  latanoprost (XALATAN) 0.005 % ophthalmic solution Place 1 drop into both eyes at bedtime.     [provider]  Liraglutide (VICTOZA [place]) Inject 0.6 mg into the skin daily.     [provider]  LUMIGAN 0.01 % SOLN Place 1 drop into both eyes at bedtime.  12/28/16   [provider]  metFORMIN (GLUCOPHAGE) 1000 MG tablet Take 0.5 tablets (500 mg total) by mouth daily. 10/27/16   [name], MD  Multiple Vitamins-Minerals (MULTIVITAMIN WITH MINERALS) tablet Take 1 tablet by mouth daily.    [provider]  mupirocin ointment (BACTROBAN) 2 % Apply 1 application topically daily as needed (sores).  01/18/17   [provider]  pioglitazone (ACTOS) 30 MG tablet Take 15 mg by mouth daily.    [provider]  promethazine-phenylephrine (PROMETHAZINE VC) 6.25-5 MG/5ML SYRP Take 5 mLs by mouth every 8 (eight) hours. 03/16/17   [provider]  telmisartan (MICARDIS) 80 MG tablet Take 40 mg by mouth daily.     [provider]  triamcinolone ointment (KENALOG) 0.1 % Apply 1 application topically 2 (two) times daily. 03/01/17   [provider]    Physical Exam: Vitals:   11/03/17 0200 11/03/17 0215 11/03/17 0244 11/03/17 0305  BP: (!) 108/51 (!) 110/48  (!) 118/46  Pulse: 98 97    Resp: _0 Temp:   97.9 F (36.6 C) 98.6 F (37 C)  TempSrc:   Oral Oral  SpO2: 99% 100%  98%  Weight:    104.9 kg (231 lb 4.2  oz)  Height:    5' 4" (1.626 m)   General: Not in acute distress HEENT:       Eyes: PERRL, EOMI, no scleral icterus.       ENT: No discharge from the ears and nose, no pharynx injection, no tonsillar enlargement.        Neck: No JVD, no bruit, no mass felt. Heme: No neck lymph node enlargement. Cardiac: S1/S2, RRR, has 3/6 systolic murmurs, No gallops or rubs. Respiratory: No rales, wheezing, rhonchi or rubs. GI: Soft, nondistended, nontender, no rebound pain, no organomegaly, BS present. GU: No hematuria Ext: has 1+ pitting leg edema bilaterally. 2+DP/PT pulse bilaterally. Musculoskeletal: No joint deformities, No joint redness or warmth, no limitation of ROM in spin. Skin: has erythema, swelling, redness and tenderness in right lower leg  Neuro: Alert, oriented X3, cranial nerves II-XII grossly intact, moves all extremities normally.  Psych: Patient is not psychotic, no suicidal or hemocidal ideation.  Labs on Admission: I have personally reviewed following labs and imaging studies  CBC: Recent Labs  Lab 11/02/17 2304  WBC 15.1*  NEUTROABS 13.6*  HGB 10.8*  HCT 34.9*  MCV 93.1  PLT PLATELET CLUMPS NOTED ON SMEAR, COUNT APPEARS DECREASED   Basic Metabolic Panel: Recent Labs  Lab 11/02/17 2304  NA 137  K 4.5  CL 101  CO2 26  GLUCOSE 167*  BUN 23  CREATININE 1.15*  CALCIUM 8.9   GFR: Estimated Creatinine Clearance: 42.2 mL/min (A) (by C-G formula based on SCr of 1.15 mg/dL (H)). Liver Function Tests: Recent Labs  Lab 11/02/17 2304  AST 19  ALT 12  ALKPHOS 75  BILITOT 0.7  PROT 6.0*  ALBUMIN 3.7   No results for input(s): LIPASE, AMYLASE in the last 168 hours. No results for input(s): AMMONIA in the last 168 hours. Coagulation Profile: No results for input(s): INR, PROTIME in the last 168 hours. Cardiac Enzymes: No results for input(s): CKTOTAL, CKMB, CKMBINDEX, TROPONINI in the last 168 hours. BNP (last 3 results) No results for input(s): PROBNP in the  last 8760 hours. HbA1C: No results for input(s): HGBA1C in the last 72 hours. CBG: Recent Labs  Lab 11/03/17 0308  GLUCAP 147*   Lipid Profile: No results for input(s): CHOL, HDL, LDLCALC, TRIG, CHOLHDL, LDLDIRECT in the last 72 hours. Thyroid Function Tests: No results for input(s): TSH, T4TOTAL, FREET4, T3FREE, THYROIDAB in the last 72 hours. Anemia Panel: No results for input(s): VITAMINB12, FOLATE, FERRITIN, TIBC, IRON, RETICCTPCT in the last 72 hours. Urine analysis:    Component Value Date/Time   COLORURINE YELLOW 11/03/2017 0004   APPEARANCEUR CLEAR 11/03/2017 0004   LABSPEC 1.018 11/03/2017 0004   PHURINE 5.0 11/03/2017 0004   GLUCOSEU NEGATIVE 11/03/2017 0004   HGBUR NEGATIVE 11/03/2017 0004   BILIRUBINUR NEGATIVE 11/03/2017 0004   KETONESUR NEGATIVE 11/03/2017 0004   PROTEINUR NEGATIVE 11/03/2017 0004   UROBILINOGEN 0.2 12/05/2010 1740   NITRITE NEGATIVE 11/03/2017 0004   LEUKOCYTESUR SMALL (A) 11/03/2017 0004   Sepsis Labs: _0 (procalcitonin:4,lacticidven:4) ) Recent Results (from the past 240 hour(s))  Blood Culture (routine x 2)     Status: None (Preliminary result)   Collection Time: 11/02/17 11:53 PM  Result Value Ref Range Status   Specimen Description BLOOD RIGHT HAND  Final   Special Requests   Final    BOTTLES DRAWN AEROBIC AND ANAEROBIC Blood Culture adequate volume Performed at [facility], [place] [address] [place], [place] [address]    Culture PENDING  Incomplete   Report Status PENDING  Incomplete     Radiological Exams on Admission: Dg Chest 2 View  Result Date: 11/02/2017 CLINICAL DATA:  Low back pain radiating to RIGHT flank. Shortness of breath. History of CHF and COPD. EXAM: CHEST - 2 VIEW COMPARISON:  Chest radiograph March 19, 2017 FINDINGS: Cardiomediastinal silhouette is unremarkable for this low inspiratory examination with crowded vasculature markings. Calcified aortic arch. Mild interstitial prominence. Bibasilar  strandy densities. The lungs are otherwise clear without pleural effusions or focal consolidations. Trachea projects midline and there is no pneumothorax. Included soft tissue planes and osseous structures are non-suspicious. Osteopenia. Suture anchors RIGHT humeral head. Moderate spondylosis. IMPRESSION: Interstitial prominence seen with pulmonary edema or atypical infection. Bibasilar atelectasis/scarring.  No focal consolidation. Aortic Atherosclerosis (ICD10-I70.0). Electronically Signed   By: Elon Alas  M.D.   On: 11/02/2017 23:02     EKG: Independently reviewed.  Sinus rhythm, QTC 487, LAD, poor R wave progression, nonspecific T wave change.   Assessment/Plan Principal Problem:   Cellulitis of right leg Active Problems:   Chronic diastolic CHF (congestive heart failure) (HCC)   CKD (chronic kidney disease) stage 3, GFR 30-59 ml/min (HCC)   Essential hypertension   Sepsis (HCC)   Type II diabetes mellitus with renal manifestations (HCC)   Flank pain   Sepsis due to cellulitis of right leg: Patient meets criteria for sepsis with leukocytosis, fever, tachycardia and tachypnea.  Lactic acid is normal.  Currently hemodynamically stable.  - will admit to tele bed as inpt - Empiric antimicrobial treatment with Rocephin (patient received 1 dose of vancomycin and Zosyn) - PRN Zofran for nausea, Percocet for pain - Blood cultures x 2  - ESR and CRP - will get Procalcitonin and trend lactic acid levels per sepsis protocol. - IVF: NS at 75 cc/h. Will not bolus of IVF due to normal lactic acid and dCHF - LE doppler to r/o DVT  Chronic diastolic CHF (congestive heart failure) ([place]): 2D echo on 10/25/2016 showed EF 60-65% with grade 1 diastolic dysfunction. Chest x-ray showed interstitial prominence, but patient does not have respiratory distress. dCHF seems to be compensated. Pt is not taking loop diuretics.  She is taking HCTZ at home. -check BNP  CKD (chronic kidney disease) stage 3,  GFR 30-59 ml/min ([place]): Baseline creatinine 1.0-1.2.  Her creatinine is 1.15, BUN 23 stable.  -Follow-up with pain BMP  Essential hypertension: -HCTZ, amlodipine and Micardis since pt is at risk of developing hypotension due to sepsis -IV hydralazine.  Type II diabetes mellitus with renal manifestations ([place]): Last A1c 6.7 on 10/24/16, well controled. Patient is taking glipizide, Actos, Victoza and metformin at home -SSI  Flank pain and lower back pain: her pain is resolved currently.  Urinalysis showed small amount of leukocyte, 11-20 of WBC, no bacteria, does not seem to have urinary tract infection.  If patient has recurrent flank pain, may consider CT per renal stone protocol to rule out kidney stone. -Check lipase -Follow-up urine culture   DVT ppx: SQ Lovenox Code Status: Partial code (I discussed with the patient in the presence of her daughter, and explained the meaning of CODE STATUS, patient wants to be partial code, OK for CPR, but no intubation). Family Communication:   Yes, patient's daughter at bed side Disposition Plan:  Anticipate discharge back to previous home environment Consults called:  none Admission status:   Inpatient/tele      Date of Service 11/03/2017    Ivor Costa [facility] Pager [phone]  If 7PM-7AM, please contact night-coverage [email or web address] Password [facility] 11/03/2017, 4:46 AM

## 2017-11-04 LAB — MAGNESIUM: Magnesium: 1.7 mg/dL (ref 1.7–2.4)

## 2017-11-04 LAB — COMPREHENSIVE METABOLIC PANEL
ALBUMIN: 3.1 g/dL — AB (ref 3.5–5.0)
ALT: 12 U/L (ref 0–44)
ANION GAP: 9 (ref 5–15)
AST: 14 U/L — ABNORMAL LOW (ref 15–41)
Alkaline Phosphatase: 58 U/L (ref 38–126)
BUN: 23 mg/dL (ref 8–23)
CALCIUM: 8.3 mg/dL — AB (ref 8.9–10.3)
CHLORIDE: 102 mmol/L (ref 98–111)
CO2: 26 mmol/L (ref 22–32)
Creatinine, Ser: 1.37 mg/dL — ABNORMAL HIGH (ref 0.44–1.00)
GFR calc Af Amer: 40 mL/min — ABNORMAL LOW (ref >60)
GFR calc non Af Amer: 34 mL/min — ABNORMAL LOW (ref >60)
GLUCOSE: 122 mg/dL — AB (ref 70–99)
Potassium: 4.4 mmol/L (ref 3.5–5.1)
SODIUM: 137 mmol/L (ref 135–145)
TOTAL PROTEIN: 5.5 g/dL — AB (ref 6.5–8.1)
Total Bilirubin: 0.6 mg/dL (ref 0.3–1.2)

## 2017-11-04 LAB — CBC
HCT: 32 % — ABNORMAL LOW (ref 36.0–46.0)
Hemoglobin: 9.6 g/dL — ABNORMAL LOW (ref 12.0–15.0)
MCH: 29 pg (ref 26.0–34.0)
MCHC: 30 g/dL (ref 30.0–36.0)
MCV: 96.7 fL (ref 78.0–100.0)
PLATELETS: UNDETERMINED 10*3/uL (ref 150–400)
RBC: 3.31 MIL/uL — ABNORMAL LOW (ref 3.87–5.11)
RDW: 14 % (ref 11.5–15.5)
WBC: 8.6 10*3/uL (ref 4.0–10.5)

## 2017-11-04 LAB — GLUCOSE, CAPILLARY
GLUCOSE-CAPILLARY: 165 mg/dL — AB (ref 70–99)
Glucose-Capillary: 121 mg/dL — ABNORMAL HIGH (ref 70–99)

## 2017-11-04 MED ORDER — CEPHALEXIN 500 MG PO CAPS: 500.0000 mg | ORAL_CAPSULE | Freq: Four times a day (QID) | ORAL | 0 refills | Status: AC

## 2017-11-04 NOTE — Progress Notes (Signed)
[name] to be D/C'd Home per MD order. Discussed with the patient and all questions fully answered.    IV catheter discontinued intact. Site without signs and symptoms of complications. Dressing and pressure applied.  An After Visit Summary was printed and given to the patient.  Patient to be escorted via [place], and D/C home via private auto.  Cyndra Numbers  11/04/2017 12:14 PM

## 2017-11-04 NOTE — Discharge Summary (Signed)
Physician Discharge Summary  [name] LFY:[id number] DOB: 03-Aug-1930 DOA: 11/02/2017  PCP: [name], MD  Admit date: 11/02/2017 Discharge date: 11/04/2017  Admitted From: Home  Disposition:  Home   Recommendations for Outpatient Follow-up:  1. Follow up with PCP in 1-2 weeks 2. Please obtain BMP/CBC in one week your next doctors visit.  3. Keflex for 6 days.   Home Health: none  Equipment/Devices: None  Discharge Condition: Stable CODE STATUS: Full  Diet recommendation: Diabetic   Brief/Interim Summary: 84 year old female with past medical history of hypertension hyperlipidemia, diabetes mellitus, COPD 2 L nasal cannula at home CHF came to the hospital with complains of right lower extremity pain and burning.  No other complaints.  In the ER she was febrile as well.  Her WBC was elevated at 15.1, on physical exam she was noted to have right lower extremity erythema concerning for cellulitis therefore started on IV Rocephin.   Lower extremity Dopplers were negative for deep vein thrombosis.  The following day her erythema was improving and she was able to bear weight without any issues.  No acute drainage was noted.  She was tolerating antibiotics and oral medications well.  Her WBC had trended down.   At this point she is reached maximal benefit from an hospital stay and stable to be discharged with outpatient follow-up recommendations as stated above.     Discharge Diagnoses:  Principal Problem:   Cellulitis of right leg Active Problems:   Chronic diastolic CHF (congestive heart failure) (HCC)   CKD (chronic kidney disease) stage 3, GFR 30-59 ml/min (HCC)   Essential hypertension   Sepsis (HCC)   Type II diabetes mellitus with renal manifestations (HCC)   Flank pain   Sepsis secondary to right lower extremity cellulitis, nonpurulent, improved - Patient is currently hemodynamically stable.  Doing well on IV Rocephin therefore will switch over to oral Keflex upon discharge.   She can use over-the-counter pain medicines.  Advised to continue to hydrate herself.  Lower semi-Dopplers is negative for dvt.  Chronic diastolic congestive heart failure -Echocardiogram done last in July 2018 showed ejection fraction 60 to 65% with grade 1 diastolic dysfunction.  Currently appears to be stable and euvolemic.  Continue to closely monitor, resume home medications  Chronic kidney disease stage III - Creatinine appears to be stable at baseline of 1.1.  Avoid nephrotoxic drugs.  Essential hypertension -Continue home medications  Diabetes mellitus type 2 -  Resume home medications   DVT prophylaxis: Lovenox Code Status: Full code Family Communication: Daughter at bedside  Disposition Plan: Discharge today     Discharge Instructions   Allergies as of 11/04/2017   No Known Allergies     Medication List    TAKE these medications   acetaminophen 650 MG CR tablet Commonly known as:  TYLENOL Take 650 mg by mouth every 8 (eight) hours as needed for pain.   ACTOS 30 MG tablet Generic drug:  pioglitazone Take 15 mg by mouth daily.   amLODipine 2.5 MG tablet Commonly known as:  NORVASC TK 1 T PO QD FOR BP   aspirin EC 81 MG tablet Take 81 mg by mouth daily.   brinzolamide 1 % ophthalmic suspension Commonly known as:  AZOPT Place 1 drop into both eyes 2 (two) times daily.   cephALEXin 500 MG capsule Commonly known as:  KEFLEX Take 1 capsule (500 mg total) by mouth 4 (four) times daily for 6 days.   glipiZIDE 5 MG tablet Commonly known  as:  GLUCOTROL Take 0.5 tablets (2.5 mg total) by mouth daily.   HYDROcodone-homatropine 5-1.5 MG/5ML syrup Commonly known as:  HYCODAN Take 5 mLs by mouth every 6 (six) hours as needed for cough.   isosorbide mononitrate 60 MG 24 hr tablet Commonly known as:  IMDUR Take 1 tablet (60 mg total) by mouth daily.   LUMIGAN 0.01 % Soln Generic drug:  bimatoprost Place 1 drop into both eyes at bedtime.   metFORMIN  1000 MG tablet Commonly known as:  GLUCOPHAGE Take 0.5 tablets (500 mg total) by mouth daily.   mupirocin ointment 2 % Commonly known as:  BACTROBAN Apply 1 application topically daily as needed (sores).   promethazine-phenylephrine 6.25-5 MG/5ML Syrp Commonly known as:  PROMETHAZINE VC Take 5 mLs by mouth every 8 (eight) hours as needed for congestion.   telmisartan 80 MG tablet Commonly known as:  MICARDIS Take 40 mg by mouth daily.   triamcinolone ointment 0.1 % Commonly known as:  KENALOG Apply 1 application topically 2 (two) times daily as needed (rash).   VICTOZA [place] Inject 0.6 mg into the skin daily.      Follow-up Information    [name], MD. Schedule an appointment as soon as possible for a visit in 2 day(s).   Specialty:  Internal Medicine Contact information: [address] [place] [address] [phone]          No Known Allergies  You were cared for by a hospitalist during your hospital stay. If you have any questions about your discharge medications or the care you received while you were in the hospital after you are discharged, you can call the unit and asked to speak with the hospitalist on call if the hospitalist that took care of you is not available. Once you are discharged, your primary care physician will handle any further medical issues. Please note that no refills for any discharge medications will be authorized once you are discharged, as it is imperative that you return to your primary care physician (or establish a relationship with a primary care physician if you do not have one) for your aftercare needs so that they can reassess your need for medications and monitor your lab values.  Consultations:  None   Procedures/Studies: Dg Chest 2 View  Result Date: 11/02/2017 CLINICAL DATA:  Low back pain radiating to RIGHT flank. Shortness of breath. History of CHF and COPD. EXAM: CHEST - 2 VIEW COMPARISON:  Chest radiograph  March 19, 2017 FINDINGS: Cardiomediastinal silhouette is unremarkable for this low inspiratory examination with crowded vasculature markings. Calcified aortic arch. Mild interstitial prominence. Bibasilar strandy densities. The lungs are otherwise clear without pleural effusions or focal consolidations. Trachea projects midline and there is no pneumothorax. Included soft tissue planes and osseous structures are non-suspicious. Osteopenia. Suture anchors RIGHT humeral head. Moderate spondylosis. IMPRESSION: Interstitial prominence seen with pulmonary edema or atypical infection. Bibasilar atelectasis/scarring.  No focal consolidation. Aortic Atherosclerosis (ICD10-I70.0). Electronically Signed   By: Elon Alas M.D.   On: 11/02/2017 23:02      Subjective: No complaints.  She is feeling better.  Ambulating well.  General = no fevers, chills, dizziness, malaise, fatigue HEENT/EYES = negative for pain, redness, loss of vision, double vision, blurred vision, loss of hearing, sore throat, hoarseness, dysphagia Cardiovascular= negative for chest pain, palpitation, murmurs, lower extremity swelling Respiratory/lungs= negative for shortness of breath, cough, hemoptysis, wheezing, mucus production Gastrointestinal= negative for nausea, vomiting,, abdominal pain, melena, hematemesis Genitourinary= negative for Dysuria,  Hematuria, Change in Urinary Frequency MSK = Negative for arthralgia, myalgias, Back Pain, Joint swelling  Neurology= Negative for headache, seizures, numbness, tingling  Psychiatry= Negative for anxiety, depression, suicidal and homocidal ideation Allergy/Immunology= Medication/Food allergy as listed  Skin= Negative for Rash, lesions, ulcers, itching   Discharge Exam: Vitals:   11/04/17 0500 11/04/17 0602  BP:  (!) 111/53  Pulse: 77 74  Resp:    Temp: 98.1 F (36.7 C) 98.1 F (36.7 C)  SpO2:  98%   Vitals:   11/03/17 1156 11/03/17 2100 11/04/17 0500 11/04/17 0602  BP:  (!) 102/44 (!) 131/54  (!) 111/53  Pulse: 72 77 77 74  Resp: 15 16    Temp: 98.4 F (36.9 C) 99.1 F (37.3 C) 98.1 F (36.7 C) 98.1 F (36.7 C)  TempSrc: Oral Oral Oral Oral  SpO2: 100% (!) 88%  98%  Weight:      Height:        General: Pt is alert, awake, not in acute distress Cardiovascular: RRR, S1/S2 +, no rubs, no gallops Respiratory: CTA bilaterally, no wheezing, no rhonchi Abdominal: Soft, NT, ND, bowel sounds + Extremities: no edema, no cyanosis Right lower extremity erythema noted it has greatly improved, less warm today.  Slight regression seen from the marked outline.   The results of significant diagnostics from this hospitalization (including imaging, microbiology, ancillary and laboratory) are listed below for reference.     Microbiology: Recent Results (from the past 240 hour(s))  Blood Culture (routine x 2)     Status: None (Preliminary result)   Collection Time: 11/02/17 11:53 PM  Result Value Ref Range Status   Specimen Description BLOOD RIGHT HAND  Final   Special Requests   Final    BOTTLES DRAWN AEROBIC AND ANAEROBIC Blood Culture adequate volume Performed at [facility], [address] [place], [place] [address]    Culture PENDING  Incomplete   Report Status PENDING  Incomplete  Urine culture     Status: Abnormal (Preliminary result)   Collection Time: 11/03/17 12:10 AM  Result Value Ref Range Status   Specimen Description URINE, CLEAN CATCH  Final   Special Requests NONE  Final   Culture (A)  Final    >=100,000 COLONIES/mL PROTEUS MIRABILIS SUSCEPTIBILITIES TO FOLLOW Performed at [facility], [address], [place], [place] [address]    Report Status PENDING  Incomplete     Labs: BNP (last 3 results) Recent Labs    03/19/17 1455 11/03/17 0720  BNP 33.1 366.4*   Basic Metabolic Panel: Recent Labs  Lab 11/02/17 2304 11/03/17 0441 11/04/17 0459  NA 137 136 137  K 4.5 4.4 4.4  CL 101 101 102  CO2 26 25 26   GLUCOSE  167* 163* 122*  BUN 23 22 23   CREATININE 1.15* 1.17* 1.37*  CALCIUM 8.9 8.2* 8.3*  MG  --   --  1.7   Liver Function Tests: Recent Labs  Lab 11/02/17 2304 11/04/17 0459  AST 19 14*  ALT 12 12  ALKPHOS 75 58  BILITOT 0.7 0.6  PROT 6.0* 5.5*  ALBUMIN 3.7 3.1*   Recent Labs  Lab 11/03/17 0441  LIPASE 32   No results for input(s): AMMONIA in the last 168 hours. CBC: Recent Labs  Lab 11/02/17 2304 11/03/17 0441 11/04/17 0459  WBC 15.1* 17.1* 8.6  NEUTROABS 13.6*  --   --   HGB 10.8* 9.6* 9.6*  HCT 34.9* 31.0* 32.0*  MCV 93.1 94.8 96.7  PLT PLATELET CLUMPS NOTED ON SMEAR, COUNT APPEARS DECREASED PLATELET CLUMPS NOTED ON SMEAR, UNABLE TO ESTIMATE PLATELET CLUMPS NOTED ON SMEAR, UNABLE TO ESTIMATE   Cardiac Enzymes: No results for input(s): CKTOTAL, CKMB, CKMBINDEX, TROPONINI in the last 168 hours. BNP: Invalid input(s): POCBNP CBG: Recent Labs  Lab 11/03/17 0615 11/03/17 1157 11/03/17 1654 11/03/17 2054 11/04/17 0607  GLUCAP 133* 120* 113* 138* 121*   D-Dimer No results for input(s): DDIMER in the last 72 hours. Hgb A1c No results for input(s): HGBA1C in the last 72 hours. Lipid Profile No results for input(s): CHOL, HDL, LDLCALC, TRIG, CHOLHDL, LDLDIRECT in the last 72 hours. Thyroid function studies No results for input(s): TSH, T4TOTAL, T3FREE, THYROIDAB in the last 72 hours.  Invalid input(s): FREET3 Anemia work up No results for input(s): VITAMINB12, FOLATE, FERRITIN, TIBC, IRON, RETICCTPCT in the last 72 hours. Urinalysis    Component Value Date/Time   COLORURINE YELLOW 11/03/2017 0004   APPEARANCEUR CLEAR 11/03/2017 0004   LABSPEC 1.018 11/03/2017 0004   PHURINE 5.0 11/03/2017 0004   GLUCOSEU NEGATIVE 11/03/2017 0004   HGBUR NEGATIVE 11/03/2017 0004   BILIRUBINUR NEGATIVE 11/03/2017 0004   KETONESUR NEGATIVE 11/03/2017 0004   PROTEINUR NEGATIVE 11/03/2017 0004   UROBILINOGEN 0.2 12/05/2010 1740   NITRITE NEGATIVE 11/03/2017 0004    LEUKOCYTESUR SMALL (A) 11/03/2017 0004   Sepsis Labs Invalid input(s): PROCALCITONIN,  WBC,  LACTICIDVEN Microbiology Recent Results (from the past 240 hour(s))  Blood Culture (routine x 2)     Status: None (Preliminary result)   Collection Time: 11/02/17 11:53 PM  Result Value Ref Range Status   Specimen Description BLOOD RIGHT HAND  Final   Special Requests   Final    BOTTLES DRAWN AEROBIC AND ANAEROBIC Blood Culture adequate volume Performed at [facility], [place] [address] [place], [place] [address]    Culture PENDING  Incomplete   Report Status PENDING  Incomplete  Urine culture     Status: Abnormal (Preliminary result)   Collection Time: 11/03/17 12:10 AM  Result Value Ref Range Status   Specimen Description URINE, CLEAN CATCH  Final   Special Requests NONE  Final   Culture (A)  Final    >=100,000 COLONIES/mL PROTEUS MIRABILIS SUSCEPTIBILITIES TO FOLLOW Performed at [facility], [address] [place], [place] [address]    Report Status PENDING  Incomplete     Time coordinating discharge:  I have spent 35 minutes face to face with the patient and on the ward discussing the patients care, assessment, plan and disposition with other care givers. >50% of the time was devoted counseling the patient about the risks and benefits of treatment/Discharge disposition and coordinating care.   SIGNED:   Damita Lack, MD  [facility] 11/04/2017, 9:28 AM Pager   If 7PM-7AM, please contact night-coverage [email or web address] Password [email or web address]

## 2017-11-05 ENCOUNTER — Encounter (HOSPITAL_COMMUNITY): Payer: Self-pay | Admitting: *Deleted

## 2017-11-05 ENCOUNTER — Ambulatory Visit (HOSPITAL_COMMUNITY)
Admission: EM | Admit: 2017-11-05 | Discharge: 2017-11-05 | Disposition: A | Discharge status: Home / Self Care | Payer: Medicare Other | Source: Home / Self Care

## 2017-11-05 ENCOUNTER — Other Ambulatory Visit: Payer: Self-pay

## 2017-11-05 ENCOUNTER — Emergency Department (HOSPITAL_COMMUNITY)
Admission: EM | Admit: 2017-11-05 | Discharge: 2017-11-06 | Disposition: A | Discharge status: Home / Self Care | Payer: Medicare Other | Attending: Emergency Medicine | Admitting: Emergency Medicine

## 2017-11-05 ENCOUNTER — Emergency Department (HOSPITAL_COMMUNITY): Payer: Medicare Other

## 2017-11-05 DIAGNOSIS — E119 Type 2 diabetes mellitus without complications: Secondary | ICD-10-CM | POA: Insufficient documentation

## 2017-11-05 DIAGNOSIS — I10 Essential (primary) hypertension: Secondary | ICD-10-CM

## 2017-11-05 DIAGNOSIS — J441 Chronic obstructive pulmonary disease with (acute) exacerbation: Secondary | ICD-10-CM | POA: Diagnosis not present

## 2017-11-05 DIAGNOSIS — Z7984 Long term (current) use of oral hypoglycemic drugs: Secondary | ICD-10-CM | POA: Insufficient documentation

## 2017-11-05 DIAGNOSIS — Z7982 Long term (current) use of aspirin: Secondary | ICD-10-CM | POA: Insufficient documentation

## 2017-11-05 DIAGNOSIS — I5032 Chronic diastolic (congestive) heart failure: Secondary | ICD-10-CM | POA: Insufficient documentation

## 2017-11-05 DIAGNOSIS — R0602 Shortness of breath: Secondary | ICD-10-CM | POA: Diagnosis not present

## 2017-11-05 DIAGNOSIS — L03115 Cellulitis of right lower limb: Secondary | ICD-10-CM | POA: Insufficient documentation

## 2017-11-05 DIAGNOSIS — I110 Hypertensive heart disease with heart failure: Secondary | ICD-10-CM | POA: Insufficient documentation

## 2017-11-05 LAB — URINE CULTURE: Culture: >=100000 — AB

## 2017-11-05 LAB — CBC WITH DIFFERENTIAL/PLATELET
BASOS PCT: 0 %
Basophils Absolute: 0 10*3/uL (ref 0.0–0.1)
EOS ABS: 0.1 10*3/uL (ref 0.0–0.7)
Eosinophils Relative: 1 %
HCT: 33.5 % — ABNORMAL LOW (ref 36.0–46.0)
HEMOGLOBIN: 10.4 g/dL — AB (ref 12.0–15.0)
LYMPHS ABS: 0.8 10*3/uL (ref 0.7–4.0)
LYMPHS PCT: 8 %
MCH: 29 pg (ref 26.0–34.0)
MCHC: 31 g/dL (ref 30.0–36.0)
MCV: 93.3 fL (ref 78.0–100.0)
MONO ABS: 0.8 10*3/uL (ref 0.1–1.0)
Monocytes Relative: 8 %
NEUTROS PCT: 83 %
Neutro Abs: 7.8 10*3/uL — ABNORMAL HIGH (ref 1.7–7.7)
PLATELETS: UNDETERMINED 10*3/uL (ref 150–400)
RBC: 3.59 MIL/uL — ABNORMAL LOW (ref 3.87–5.11)
RDW: 13.6 % (ref 11.5–15.5)
WBC: 9.5 10*3/uL (ref 4.0–10.5)

## 2017-11-05 LAB — BASIC METABOLIC PANEL
ANION GAP: 11 (ref 5–15)
BUN: 15 mg/dL (ref 8–23)
CALCIUM: 9.3 mg/dL (ref 8.9–10.3)
CHLORIDE: 101 mmol/L (ref 98–111)
CO2: 25 mmol/L (ref 22–32)
Creatinine, Ser: 1.2 mg/dL — ABNORMAL HIGH (ref 0.44–1.00)
GFR calc non Af Amer: 40 mL/min — ABNORMAL LOW (ref >60)
GFR, EST AFRICAN AMERICAN: 46 mL/min — AB (ref >60)
Glucose, Bld: 125 mg/dL — ABNORMAL HIGH (ref 70–99)
Potassium: 4.3 mmol/L (ref 3.5–5.1)
Sodium: 137 mmol/L (ref 135–145)

## 2017-11-05 LAB — BRAIN NATRIURETIC PEPTIDE: B NATRIURETIC PEPTIDE 5: 452.4 pg/mL — AB (ref 0.0–100.0)

## 2017-11-05 LAB — I-STAT TROPONIN, ED: TROPONIN I, POC: 0 ng/mL (ref 0.00–0.08)

## 2017-11-05 LAB — I-STAT CG4 LACTIC ACID, ED: Lactic Acid, Venous: 1.25 mmol/L (ref 0.5–1.9)

## 2017-11-05 MED ORDER — IPRATROPIUM BROMIDE 0.02 % IN SOLN
0.5000 mg | Freq: Once | RESPIRATORY_TRACT | Status: AC
  Administered 2017-11-05: 0.5 mg via RESPIRATORY_TRACT
  Filled 2017-11-05: qty 2.5

## 2017-11-05 MED ORDER — HYDROCODONE-ACETAMINOPHEN 5-325 MG PO TABS
1.0000 | ORAL_TABLET | Freq: Once | ORAL | Status: AC
  Administered 2017-11-05: 1 via ORAL
  Filled 2017-11-05: qty 1

## 2017-11-05 MED ORDER — ALBUTEROL SULFATE (2.5 MG/3ML) 0.083% IN NEBU
5.0000 mg | INHALATION_SOLUTION | Freq: Once | RESPIRATORY_TRACT | Status: AC
  Administered 2017-11-05: 5 mg via RESPIRATORY_TRACT
  Filled 2017-11-05: qty 6

## 2017-11-05 MED ORDER — DEXAMETHASONE SODIUM PHOSPHATE 10 MG/ML IJ SOLN
10.0000 mg | Freq: Once | INTRAMUSCULAR | Status: AC
Start: 2017-11-05 — End: 2017-11-05
  Administered 2017-11-05: 10 mg via INTRAVENOUS
  Filled 2017-11-05: qty 1

## 2017-11-05 NOTE — ED Provider Notes (Signed)
[facility] EMERGENCY DEPARTMENT Provider Note   CSN: [id number] Arrival date & time: 11/05/17  1924     History   Chief Complaint Chief Complaint  Patient presents with  . Shortness of Breath  . body swollen    HPI [name] is a 84 y.o. female.  HPI   Patient is an 10-year female with a history of heart failure preserved ejection fraction, hyperlipidemia, hypertension, type 2 diabetes mellitus, and COPD presenting for shortness of breath and "swelling".  Patient reports she was discharged in the hospital yesterday for cellulitis, where she was given 1 day of IV antibiotics and transition to Keflex.  Patient reports she began Keflex yesterday, and took an additional dose today, and noticed that she had increased swelling in her bilateral feet and hands.  Patient also noted increased shortness of breath today, feeling that she is "wheezing".  Patient reports that her COPD is controlled with 2 L of oxygen at bedtime and while napping, but she is on no inhalers either rescue or controller.  Patient denies previous hospitalizations for COPD exacerbation.  Patient denies any development of rash or pruritus.  No oral swelling or sensation of throat closing. Patient was admitted for 2 days only. Patient had subcutaneous Lovenox as prophylaxis.   Past Medical History:  Diagnosis Date  . CHF (congestive heart failure) ([place])   . COPD (chronic obstructive pulmonary disease) ([place])   . Diabetes mellitus   . Glaucoma   . Hyperlipidemia   . Hypertension     Patient Active Problem List   Diagnosis Date Noted  . Essential hypertension 11/03/2017  . Sepsis ([place]) 11/03/2017  . Type II diabetes mellitus with renal manifestations ([place]) 11/03/2017  . Cellulitis of right leg 11/03/2017  . Flank pain 11/03/2017  . Chronic diastolic CHF (congestive heart failure) ([place]) 03/19/2017  . Diabetes mellitus ([place]) 03/19/2017  . Obesity, diabetes, and hypertension syndrome ([place])  03/19/2017  . CKD (chronic kidney disease) stage 3, GFR 30-59 ml/min (HCC) 03/19/2017  . Syncope 10/24/2016  . Pulmonary hypertension ([place]) 12/07/2015  . Varicose veins of leg with complications 16/10/3708  . Varicose veins of left lower extremity with complications 62/69/4854  . Varicose veins of lower extremities with other complications 62/70/3500  . Lipoma of back 07/19/2012    Past Surgical History:  Procedure Laterality Date  . BACK SURGERY    . CARDIAC CATHETERIZATION N/A 12/09/2015   Procedure: Right Heart Cath;  Surgeon: Adrian Prows, MD;  Location: [facility];  Service: Cardiovascular;  Laterality: N/A;  . CHOLECYSTECTOMY  2000  . HERNIA REPAIR  2007  . rupture disk  1970"s  . SHOULDER ARTHROSCOPY WITH ROTATOR CUFF REPAIR  1999   rt shoulder     OB History   None      Home Medications    Prior to Admission medications   Medication Sig Start Date End Date Taking? Authorizing Provider  acetaminophen (TYLENOL) 650 MG CR tablet Take 650 mg by mouth every 8 (eight) hours as needed for pain.    [provider]  amLODipine (NORVASC) 2.5 MG tablet TK 1 T PO QD FOR BP 06/09/17   [provider]  aspirin EC 81 MG tablet Take 81 mg by mouth daily.    [provider]  brinzolamide (AZOPT) 1 % ophthalmic suspension Place 1 drop into both eyes 2 (two) times daily.    [provider]  cephALEXin (KEFLEX) 500 MG capsule Take 1 capsule (500 mg  total) by mouth 4 (four) times daily for 6 days. 11/04/17 11/10/17  Amin, Ankit Chirag, MD  glipiZIDE (GLUCOTROL) 5 MG tablet Take 0.5 tablets (2.5 mg total) by mouth daily. Patient not taking: Reported on 06/14/2017 10/27/16   [name], MD  HYDROcodone-homatropine [facility]) 5-1.5 MG/5ML syrup Take 5 mLs by mouth every 6 (six) hours as needed for cough. Patient not taking: Reported on 06/14/2017 03/21/17   [name], MD  isosorbide mononitrate (IMDUR) 60 MG 24 hr tablet Take 1 tablet (60 mg total)  by mouth daily. 10/27/16   [name], MD  Liraglutide (VICTOZA [place]) Inject 0.6 mg into the skin daily.     [provider]  LUMIGAN 0.01 % SOLN Place 1 drop into both eyes at bedtime.  12/28/16   [provider]  metFORMIN (GLUCOPHAGE) 1000 MG tablet Take 0.5 tablets (500 mg total) by mouth daily. 10/27/16   [name], MD  mupirocin ointment (BACTROBAN) 2 % Apply 1 application topically daily as needed (sores).  01/18/17   [provider]  pioglitazone (ACTOS) 30 MG tablet Take 15 mg by mouth daily.    [provider]  promethazine-phenylephrine (PROMETHAZINE VC) 6.25-5 MG/5ML SYRP Take 5 mLs by mouth every 8 (eight) hours as needed for congestion.  03/16/17   [provider]  telmisartan (MICARDIS) 80 MG tablet Take 40 mg by mouth daily.     [provider]  triamcinolone ointment (KENALOG) 0.1 % Apply 1 application topically 2 (two) times daily as needed (rash).  03/01/17   [provider]    Family History Family History  Problem Relation Age of Onset  . Stroke Mother     Social History Social History   Tobacco Use  . Smoking status: Never Smoker  . Smokeless tobacco: Never Used  Substance Use Topics  . Alcohol use: No  . Drug use: No     Allergies   Patient has no known allergies.   Review of Systems Review of Systems  HENT: Negative for congestion and rhinorrhea.   Respiratory: Positive for shortness of breath and wheezing. Negative for chest tightness.   Cardiovascular: Positive for leg swelling. Negative for chest pain.  Skin: Positive for color change. Negative for rash and wound.  All other systems reviewed and are negative.    Physical Exam Updated Vital Signs BP (!) 168/65 (BP Location: Right Arm)   Pulse 82   Temp 98.3 F (36.8 C) (Oral)   Resp 18   Ht 5\' 4"  (1.626 m)   Wt 104.8 kg (231 lb)   SpO2 94%   BMI 39.65 kg/m   Physical Exam  Constitutional: She appears well-developed and  well-nourished. No distress.  HENT:  Head: Normocephalic and atraumatic.  Mouth/Throat: Oropharynx is clear and moist.  Eyes: Pupils are equal, round, and reactive to light. Conjunctivae and EOM are normal.  Neck: Normal range of motion. Neck supple.  Cardiovascular: Normal rate, regular rhythm, S1 normal and S2 normal.  Murmur heard. Pulses:      Radial pulses are 2+ on the right side, and 2+ on the left side.       Dorsalis pedis pulses are 2+ on the right side, and 2+ on the left side.  Blowing holosystolic murmur.  Pulmonary/Chest: Effort normal. She has wheezes. She has no rales.  Wheezes present in bilateral upper lung fields.  Abdominal: Soft. She exhibits no distension. There is no tenderness. There is no guarding.  Musculoskeletal: Normal range of motion. She  exhibits edema. She exhibits no deformity.  1+ nonpitting edema bilateral lower extremity's.  Lymphadenopathy:    She has no cervical adenopathy.  Neurological: She is alert.  Cranial nerves grossly intact. Patient moves extremities symmetrically and with good coordination.  Skin: Skin is warm and dry.  Well-circumscribed area of the erythema in right lower extremity.  Not expanding from area marked by skin pen. Posterior thorax has diffuse skin tags and seborrheic keratoses.  Psychiatric: She has a normal mood and affect. Her behavior is normal. Judgment and thought content normal.  Nursing note and vitals reviewed.    ED Treatments / Results  Labs (all labs ordered are listed, but only abnormal results are displayed) Labs Reviewed  BASIC METABOLIC PANEL - Abnormal; Notable for the following components:      Result Value   Glucose, Bld 125 (*)    Creatinine, Ser 1.20 (*)    GFR calc non Af Amer 40 (*)    GFR calc Af Amer 46 (*)    All other components within normal limits  CBC WITH DIFFERENTIAL/PLATELET - Abnormal; Notable for the following components:   RBC 3.59 (*)    Hemoglobin 10.4 (*)    HCT 33.5 (*)      Neutro Abs 7.8 (*)    All other components within normal limits  BRAIN NATRIURETIC PEPTIDE - Abnormal; Notable for the following components:   B Natriuretic Peptide 452.4 (*)    All other components within normal limits  I-STAT TROPONIN, ED  I-STAT CG4 LACTIC ACID, ED  I-STAT CG4 LACTIC ACID, ED    EKG EKG Interpretation  Date/Time:  Friday November 05 2017 19:32:32 EDT Ventricular Rate:  87 PR Interval:  238 QRS Duration: 84 QT Interval:  370 QTC Calculation: 445 R Axis:   -40 Text Interpretation:  Sinus rhythm with 1st degree A-V block Left axis deviation Abnormal ECG Confirmed by Veryl Speak [phone]) on 11/06/2017 12:26:29 AM   Radiology Dg Chest 2 View  Result Date: 11/05/2017 CLINICAL DATA:  Increasing shortness of breath. Recently started antibiotics for right lower leg cellulitis. EXAM: CHEST - 2 VIEW COMPARISON:  11/02/2017 and 03/19/2017 radiographs. FINDINGS: Stable cardiomegaly and aortic atherosclerosis. Interval improved aeration of the lungs. The pulmonary vascularity is normal. There is chronic central airway thickening without focal airspace disease, pleural effusion or pneumothorax. Mild degenerative changes are present throughout the spine. Postsurgical changes are noted at the right shoulder. Telemetry leads overlie the chest. IMPRESSION: Improved aeration of the lungs compared with recent prior study. Stable cardiomegaly, aortic atherosclerosis and chronic central airway thickening. Electronically Signed   By: Richardean Sale M.D.   On: 11/05/2017 20:48    Procedures Procedures (including critical care time)  Medications Ordered in ED Medications  HYDROcodone-acetaminophen (NORCO/VICODIN) 5-325 MG per tablet 1 tablet (has no administration in time range)  albuterol (PROVENTIL) (2.5 MG/3ML) 0.083% nebulizer solution 5 mg (has no administration in time range)  ipratropium (ATROVENT) nebulizer solution 0.5 mg (has no administration in time range)     Initial  Impression / Assessment and Plan / ED Course  I have reviewed the triage vital signs and the nursing notes.  Pertinent labs & imaging results that were available during my care of the patient were reviewed by me and considered in my medical decision making (see chart for details).  Clinical Course as of Nov 06 149  Sat Nov 06, 2017  0009 Wheezes absent. Ambulated personally by me.  Patient reports she feels she is at her  baseline, but is working to breathe clinically, and oxygen saturation 88-90%.  Patient denying shortness of breath on ambulation.   [AM]  [phone] Spoke with Dr. Si Raider of [facility] who states that goal O2 is 88-92% and if patient feels she is at baseline, she can be treated outpatient with close PCP follow up. This was verbal consultation. Patient not evaluated by hospitalist. Appreciate Dr. Marcia Brash involvement in the care of this patient.    [AM]    Clinical Course User Index [AM] Albesa Seen, PA-C   Patient nontoxic-appearing and in no acute respiratory distress on initial examination, the patient does have wheezes.  Different diagnosis includes COPD exacerbation, allergic reaction, heart failure exacerbation, PE.  Doubt allergic reaction to Keflex, as patient had gradually developed symptoms, and had no sudden onset type I hypersensitivity symptoms within 3 hours of initiating Keflex.  Doubt PE, as patient was only hospitalized for 2 days, is no history of DVT/PE, and has symptoms clearly suggestive of COPD exacerbation.  Feel it is reasonable to consider changing to doxycycline to cover both pulmonary and skin sources of infection given that patient has wheezing.  Patient is not on any controller inhaler therapy, and is having desaturations, tachypnea, and tachycardia with ambulation in the emergency department.  Patient administered 2 nebulizer treatments as well as 10 mg of Decadron IV.  Cellulitis appears to be improving.  Creatinine improving. See below for  values.  Hemoglobin is stable.  BNP is slightly elevated from yesterday, but patient does not clinically nor on chest x-ray appears to have significant pulmonary edema suggestive of acute heart failure exacerbation.  Chest x-ray showing improved aeration, but no evidence of infiltrate.Troponin is negative.  EKG unchanged from prior on admission 3 days ago.    Lab Results  Component Value Date   CREATININE 1.20 (H) 11/05/2017   CREATININE 1.37 (H) 11/04/2017   CREATININE 1.17 (H) 11/03/2017   Hospital medicine was consulted for admission, but on their discussion with myself of the patient, they do not feel that hospitalization would be beneficial to patient, and aggressive outpatient therapy as well as close follow-up with PCP would be most indicated.  Patient was encouraged to return to the emergency department immediately if she experienced any clinical decompensation with chest pain, shortness of breath, CP or presyncope.  Prior to discharge, the change of plan was discussed with attending physician, Dr. Veryl Speak. Patient and her husband are in understanding and agree with the plan of care.  This is a shared visit with Dr. Elnora Morrison. Patient was independently evaluated by this attending physician. Attending physician consulted in evaluation and management.  Final Clinical Impressions(s) / ED Diagnoses   Final diagnoses:  COPD exacerbation ([place])  Cellulitis of right lower extremity  Elevated blood pressure reading in office with diagnosis of hypertension    ED Discharge Orders        Ordered    albuterol (PROVENTIL HFA;VENTOLIN HFA) 108 (90 Base) MCG/ACT inhaler  Every 6 hours PRN     11/06/17 0111    doxycycline (VIBRAMYCIN) 100 MG capsule  2 times daily     11/06/17 0111    predniSONE (DELTASONE) 20 MG tablet  Daily with breakfast,   Status:  Discontinued     11/06/17 0111    predniSONE (DELTASONE) 10 MG tablet  Daily with breakfast     11/06/17 0128       Albesa Seen, PA-C 11/06/17 0152    Elnora Morrison, MD 11/09/17  0354  

## 2017-11-05 NOTE — ED Triage Notes (Signed)
The pt was here last pm  She was sob and had cellulitis of his rt lower leg  She has had 2 keflex  Today and she has been more sob since she took these pills.  No acute distress she reports that she is on 02 at night

## 2017-11-05 NOTE — ED Notes (Signed)
Patient transported to X-ray 

## 2017-11-05 NOTE — ED Triage Notes (Signed)
Patient states she was just discharged from the hospital yest after being treated for cellulitis and started her first dose of Keflex last pm , states she was up all night sob and diarrhea. Today took another dose and states she is now swelling more and increased sob. States she called Dr. Maudie Mercury office this am however never got a call back.

## 2017-11-05 NOTE — Consult Note (Signed)
            [facility] CM Primary Care Navigator  11/05/2017  [name] 29-Sep-1930 [id number]   Attempt to see patient at the bedside to identify possible discharge needs butshe wasalreadydischarged home yesterday.  Per chart review, patientpresented with fever, right lower extremity pain and burning. She was treated for sepsis secondary to right lower extremity cellulitis.  Primary care provider's office is listed as providing transition of care (TOC) follow-up.  Patient has discharge instruction to follow-up withprimary care provider in 2 days   For additional questions please contact:  Edwena Felty A. Javarius Tsosie, BSN, RN-BC [facility] PRIMARY CARE Navigator Cell: [phone]

## 2017-11-05 NOTE — ED Provider Notes (Addendum)
MSE was initiated and I personally evaluated the patient and placed orders (if any) at  7:38 PM on November 05, 2017.  The patient appears stable so that the remainder of the MSE may be completed by another provider.  Patient placed in Quick Look pathway, seen and evaluated   Chief Complaint: Shortness of breath  HPI:   Patient presents to ED for evaluation of shortness of breath, diarrhea, generalized body aches, lower extremity edema.  Patient was admitted for right lower extremity cellulitis causing sepsis and discharged yesterday after a 2-day hospital stay.  She was discharged home with Keflex.  She took her first dose yesterday and started having the shortness of breath, diarrhea, nausea.  She then took her second dose this morning with similar symptoms.  She believes the edema in her legs is worsening.  Reports chronic chest and back pain.  Denies any fever, injuries or falls.  Does not believe that she has taken Keflex in the past.  ROS: Shortness of breath  Physical Exam:   Gen: No distress  Neuro: Awake and Alert  Skin: Warm    Focused Exam: Erythema and pitting edema of right lower extremity.  Tender to touch. Lungs CTAB. Normal WOB.   Initiation of care has begun. The patient has been counseled on the process, plan, and necessity for staying for the completion/evaluation, and the remainder of the medical screening examination    Delia Heady, PA-C 11/05/17 1943    Carmin Muskrat, MD 11/05/17 2251

## 2017-11-05 NOTE — Progress Notes (Signed)
Accidentally visited this patient but they were so graciously appreciative of my presence as Chaplain and welcomed prayer.  We prayed together and I went to find the right patient.  Thankful for the medical professionals looking after her and her family.    11/05/17 2215  Clinical Encounter Type  Visited With Patient and family together  Visit Type Initial;Spiritual support;ED  Spiritual Encounters  Spiritual Needs Prayer

## 2017-11-05 NOTE — ED Notes (Signed)
Discussed patient status with Dr. Meda Coffee , Patient taken to ED via [facility] staff for further eval.

## 2017-11-06 DIAGNOSIS — J441 Chronic obstructive pulmonary disease with (acute) exacerbation: Secondary | ICD-10-CM | POA: Diagnosis not present

## 2017-11-06 MED ORDER — PREDNISONE 10 MG PO TABS: 30.0000 mg | ORAL_TABLET | Freq: Every day | ORAL | 0 refills | Status: AC

## 2017-11-06 MED ORDER — AEROCHAMBER PLUS FLO-VU LARGE MISC
1.0000 | Freq: Once | Status: DC
Start: 2017-11-06 — End: 2017-11-06

## 2017-11-06 MED ORDER — IPRATROPIUM-ALBUTEROL 0.5-2.5 (3) MG/3ML IN SOLN
3.0000 mL | Freq: Once | RESPIRATORY_TRACT | Status: AC
  Administered 2017-11-06: 3 mL via RESPIRATORY_TRACT
  Filled 2017-11-06: qty 3

## 2017-11-06 MED ORDER — ALBUTEROL SULFATE HFA 108 (90 BASE) MCG/ACT IN AERS: 2.0000 | INHALATION_SPRAY | Freq: Four times a day (QID) | RESPIRATORY_TRACT | 0 refills | Status: DC | PRN

## 2017-11-06 MED ORDER — ALBUTEROL SULFATE HFA 108 (90 BASE) MCG/ACT IN AERS
2.0000 | INHALATION_SPRAY | Freq: Four times a day (QID) | RESPIRATORY_TRACT | Status: DC
  Administered 2017-11-06: 2 via RESPIRATORY_TRACT
  Filled 2017-11-06: qty 6.7

## 2017-11-06 MED ORDER — PREDNISONE 20 MG PO TABS: 40.0000 mg | ORAL_TABLET | Freq: Every day | ORAL | 0 refills | Status: DC

## 2017-11-06 MED ORDER — DOXYCYCLINE HYCLATE 100 MG PO CAPS: 100.0000 mg | ORAL_CAPSULE | Freq: Two times a day (BID) | ORAL | 0 refills | Status: AC

## 2017-11-06 MED ORDER — DOXYCYCLINE HYCLATE 100 MG PO TABS
100.0000 mg | ORAL_TABLET | Freq: Once | ORAL | Status: AC
  Administered 2017-11-06: 100 mg via ORAL
  Filled 2017-11-06: qty 1

## 2017-11-06 NOTE — Discharge Instructions (Addendum)
Please see the information and instructions below regarding your visit.  Your diagnoses today include:  1. COPD exacerbation ([place])   2. Cellulitis of right lower extremity   3. Elevated blood pressure reading in office with diagnosis of hypertension    Your shortness of breath is likely secondary to a COPD exacerbation.  Exacerbations are treated with steroids, albuterol inhalers, and/or antibiotics.  We will change her antibiotic to doxycycline to cover bacteria in both the lungs and the skin.  He will take it twice a day for 1 week.  Tests performed today include: See side panel of your discharge paperwork for testing performed today. Vital signs are listed at the bottom of these instructions.   Your chest x-ray shows no evidence of pneumonia.  Medications prescribed:    Take any prescribed medications only as prescribed, and any over the counter medications only as directed on the packaging.  You will begin scheduled albuterol inhaler treatments, 2 puffs every 6 hours for the next 5 days.  You are prescribed prednisone, a steroid. This is a medication to help reduce inflammation in the lungs.  Common side effects include upset stomach/nausea. You may take this medicine with food if this occurs. Other side effects include restlessness, difficulty sleeping, and increased sweating. Call your healthcare provider if these do not resolve after finishing the medication.  This medicine may increase your blood sugar so additional careful monitoring is needed of blood sugar if you have diabetes. Call your healthcare provider for any signs/symtpoms of high blood sugar such as confusion, feeling sleepy, more thirst, more hunger, passing urine more often, flushing, fast breathing, or breath that smells like fruit.  Doxycycline is an antibiotic that fights infection in the the skin. This medication can make your skin sensitive to the sun, so please ensure that you wear sunscreen, hats, or other  coverage over your skin while taking this. This medicine CANNOT be taken by women while pregnant, breastfeeding, or trying to become pregnant.  Please speak with a healthcare provider if any of these situations apply to you.   Home care instructions:  Please follow any educational materials contained in this packet.   Follow-up instructions: Please follow-up with your primary care provider first thing Monday morning for further evaluation of your symptoms if they are not completely improved.   Return instructions:  Please return to the Emergency Department if you experience worsening symptoms.  Please return to the emergency department immediately if you develop any chest pain, increasing shortness breath, increasing swelling in your legs, rash, feel that you are going to pass out, or fevers. Please return if you have any other emergent concerns.  Additional Information:   Your vital signs today were: BP (!) 155/68    Pulse 96    Temp 98.3 F (36.8 C) (Oral)    Resp 19    Ht 5\' 4"  (1.626 m)    Wt 104.8 kg (231 lb)    SpO2 95%    BMI 39.65 kg/m  If your blood pressure (BP) was elevated on multiple readings during this visit above 130 for the top number or above 80 for the bottom number, please have this repeated by your primary care provider within one month. --------------  Thank you for allowing [place] to participate in your care today.

## 2017-11-06 NOTE — ED Notes (Signed)
Patient Alert and oriented to baseline. Stable and ambulatory to baseline. Patient verbalized understanding of the discharge instructions.  Patient belongings were taken by the patient.   

## 2017-11-08 DIAGNOSIS — L0390 Cellulitis, unspecified: Secondary | ICD-10-CM | POA: Diagnosis not present

## 2017-11-08 DIAGNOSIS — E119 Type 2 diabetes mellitus without complications: Secondary | ICD-10-CM | POA: Diagnosis not present

## 2017-11-08 DIAGNOSIS — I10 Essential (primary) hypertension: Secondary | ICD-10-CM | POA: Diagnosis not present

## 2017-11-08 DIAGNOSIS — Z09 Encounter for follow-up examination after completed treatment for conditions other than malignant neoplasm: Secondary | ICD-10-CM | POA: Diagnosis not present

## 2017-11-08 LAB — CULTURE, BLOOD (ROUTINE X 2)
CULTURE: NO GROWTH
CULTURE: NO GROWTH
SPECIAL REQUESTS: ADEQUATE

## 2017-11-15 DIAGNOSIS — I10 Essential (primary) hypertension: Secondary | ICD-10-CM | POA: Diagnosis not present

## 2017-11-15 DIAGNOSIS — E1140 Type 2 diabetes mellitus with diabetic neuropathy, unspecified: Secondary | ICD-10-CM | POA: Diagnosis not present

## 2017-11-15 DIAGNOSIS — E119 Type 2 diabetes mellitus without complications: Secondary | ICD-10-CM | POA: Diagnosis not present

## 2017-11-19 DIAGNOSIS — I350 Nonrheumatic aortic (valve) stenosis: Secondary | ICD-10-CM | POA: Diagnosis not present

## 2017-11-19 DIAGNOSIS — I10 Essential (primary) hypertension: Secondary | ICD-10-CM | POA: Diagnosis not present

## 2017-12-08 DIAGNOSIS — M25562 Pain in left knee: Secondary | ICD-10-CM | POA: Diagnosis not present

## 2017-12-08 DIAGNOSIS — M1712 Unilateral primary osteoarthritis, left knee: Secondary | ICD-10-CM | POA: Diagnosis not present

## 2017-12-31 DIAGNOSIS — L570 Actinic keratosis: Secondary | ICD-10-CM | POA: Diagnosis not present

## 2017-12-31 DIAGNOSIS — X32XXXD Exposure to sunlight, subsequent encounter: Secondary | ICD-10-CM | POA: Diagnosis not present

## 2017-12-31 DIAGNOSIS — Z08 Encounter for follow-up examination after completed treatment for malignant neoplasm: Secondary | ICD-10-CM | POA: Diagnosis not present

## 2017-12-31 DIAGNOSIS — L304 Erythema intertrigo: Secondary | ICD-10-CM | POA: Diagnosis not present

## 2017-12-31 DIAGNOSIS — E119 Type 2 diabetes mellitus without complications: Secondary | ICD-10-CM | POA: Diagnosis not present

## 2017-12-31 DIAGNOSIS — Z85828 Personal history of other malignant neoplasm of skin: Secondary | ICD-10-CM | POA: Diagnosis not present

## 2017-12-31 DIAGNOSIS — I10 Essential (primary) hypertension: Secondary | ICD-10-CM | POA: Diagnosis not present

## 2018-01-07 DIAGNOSIS — E7800 Pure hypercholesterolemia, unspecified: Secondary | ICD-10-CM | POA: Diagnosis not present

## 2018-01-07 DIAGNOSIS — I10 Essential (primary) hypertension: Secondary | ICD-10-CM | POA: Diagnosis not present

## 2018-01-07 DIAGNOSIS — E139 Other specified diabetes mellitus without complications: Secondary | ICD-10-CM | POA: Diagnosis not present

## 2018-01-07 DIAGNOSIS — Z23 Encounter for immunization: Secondary | ICD-10-CM | POA: Diagnosis not present

## 2018-02-08 DIAGNOSIS — H401131 Primary open-angle glaucoma, bilateral, mild stage: Secondary | ICD-10-CM | POA: Diagnosis not present

## 2018-05-09 DIAGNOSIS — E119 Type 2 diabetes mellitus without complications: Secondary | ICD-10-CM | POA: Diagnosis not present

## 2018-05-09 DIAGNOSIS — I10 Essential (primary) hypertension: Secondary | ICD-10-CM | POA: Diagnosis not present

## 2018-05-10 DIAGNOSIS — D696 Thrombocytopenia, unspecified: Secondary | ICD-10-CM | POA: Diagnosis not present

## 2018-05-13 DIAGNOSIS — I509 Heart failure, unspecified: Secondary | ICD-10-CM | POA: Diagnosis not present

## 2018-05-13 DIAGNOSIS — E785 Hyperlipidemia, unspecified: Secondary | ICD-10-CM | POA: Diagnosis not present

## 2018-05-13 DIAGNOSIS — D649 Anemia, unspecified: Secondary | ICD-10-CM | POA: Diagnosis not present

## 2018-05-13 DIAGNOSIS — R0609 Other forms of dyspnea: Secondary | ICD-10-CM | POA: Diagnosis not present

## 2018-05-13 DIAGNOSIS — E119 Type 2 diabetes mellitus without complications: Secondary | ICD-10-CM | POA: Diagnosis not present

## 2018-05-13 DIAGNOSIS — E1140 Type 2 diabetes mellitus with diabetic neuropathy, unspecified: Secondary | ICD-10-CM | POA: Diagnosis not present

## 2018-05-13 DIAGNOSIS — Z780 Asymptomatic menopausal state: Secondary | ICD-10-CM | POA: Diagnosis not present

## 2018-05-13 DIAGNOSIS — Z0000 Encounter for general adult medical examination without abnormal findings: Secondary | ICD-10-CM | POA: Diagnosis not present

## 2018-05-13 DIAGNOSIS — I10 Essential (primary) hypertension: Secondary | ICD-10-CM | POA: Diagnosis not present

## 2018-05-13 DIAGNOSIS — I272 Pulmonary hypertension, unspecified: Secondary | ICD-10-CM | POA: Diagnosis not present

## 2018-05-13 DIAGNOSIS — I350 Nonrheumatic aortic (valve) stenosis: Secondary | ICD-10-CM | POA: Diagnosis not present

## 2018-05-13 DIAGNOSIS — I340 Nonrheumatic mitral (valve) insufficiency: Secondary | ICD-10-CM | POA: Diagnosis not present

## 2018-05-30 ENCOUNTER — Telehealth: Payer: Self-pay

## 2018-05-30 MED ORDER — FUROSEMIDE 20 MG PO TABS: 20.0000 mg | ORAL_TABLET | Freq: Two times a day (BID) | ORAL | 0 refills | Status: DC

## 2018-05-30 NOTE — Telephone Encounter (Signed)
Pt called and her BP has been very high 194/87, she has gained weight and has swollen legs. She wanted to come in and see you.

## 2018-05-30 NOTE — Telephone Encounter (Signed)
She can come in to be seen. Call in furosemide 20 mg tablets twice daily for a week and then stop.  Find out if still on Actos, may contribute to leg swelling

## 2018-05-30 NOTE — Telephone Encounter (Signed)
Per below message, the patient is still taking actos. I told her I would send in furosemide 20 BID. She will call us and let us know if that helps, should she stop actos??    She can come in to be seen. Call in furosemide 20 mg tablets twice daily for a week and then stop.  Find out if still on Actos, may contribute to leg swelling

## 2018-06-14 DIAGNOSIS — H401131 Primary open-angle glaucoma, bilateral, mild stage: Secondary | ICD-10-CM | POA: Diagnosis not present

## 2018-08-09 ENCOUNTER — Other Ambulatory Visit: Payer: Self-pay | Admitting: Cardiology

## 2018-08-09 DIAGNOSIS — I350 Nonrheumatic aortic (valve) stenosis: Secondary | ICD-10-CM

## 2018-09-01 DIAGNOSIS — N390 Urinary tract infection, site not specified: Secondary | ICD-10-CM | POA: Diagnosis not present

## 2018-09-01 DIAGNOSIS — I10 Essential (primary) hypertension: Secondary | ICD-10-CM | POA: Diagnosis not present

## 2018-09-07 DIAGNOSIS — E119 Type 2 diabetes mellitus without complications: Secondary | ICD-10-CM | POA: Diagnosis not present

## 2018-09-07 DIAGNOSIS — D649 Anemia, unspecified: Secondary | ICD-10-CM | POA: Diagnosis not present

## 2018-09-07 DIAGNOSIS — Z23 Encounter for immunization: Secondary | ICD-10-CM | POA: Diagnosis not present

## 2018-09-07 DIAGNOSIS — I10 Essential (primary) hypertension: Secondary | ICD-10-CM | POA: Diagnosis not present

## 2018-09-13 DIAGNOSIS — H401131 Primary open-angle glaucoma, bilateral, mild stage: Secondary | ICD-10-CM | POA: Diagnosis not present

## 2018-09-15 DIAGNOSIS — C44629 Squamous cell carcinoma of skin of left upper limb, including shoulder: Secondary | ICD-10-CM | POA: Diagnosis not present

## 2018-09-15 DIAGNOSIS — X32XXXD Exposure to sunlight, subsequent encounter: Secondary | ICD-10-CM | POA: Diagnosis not present

## 2018-09-15 DIAGNOSIS — L570 Actinic keratosis: Secondary | ICD-10-CM | POA: Diagnosis not present

## 2018-09-15 DIAGNOSIS — D0439 Carcinoma in situ of skin of other parts of face: Secondary | ICD-10-CM | POA: Diagnosis not present

## 2018-09-20 DIAGNOSIS — I10 Essential (primary) hypertension: Secondary | ICD-10-CM | POA: Diagnosis not present

## 2018-09-20 DIAGNOSIS — D649 Anemia, unspecified: Secondary | ICD-10-CM | POA: Diagnosis not present

## 2018-09-20 DIAGNOSIS — I272 Pulmonary hypertension, unspecified: Secondary | ICD-10-CM | POA: Diagnosis not present

## 2018-09-20 DIAGNOSIS — I340 Nonrheumatic mitral (valve) insufficiency: Secondary | ICD-10-CM | POA: Diagnosis not present

## 2018-10-03 ENCOUNTER — Other Ambulatory Visit: Payer: Self-pay | Admitting: Cardiology

## 2018-10-05 ENCOUNTER — Other Ambulatory Visit: Payer: Medicare Other

## 2018-10-19 ENCOUNTER — Ambulatory Visit: Payer: Self-pay | Admitting: Cardiology

## 2018-10-21 DIAGNOSIS — C44529 Squamous cell carcinoma of skin of other part of trunk: Secondary | ICD-10-CM | POA: Diagnosis not present

## 2018-10-21 DIAGNOSIS — X32XXXD Exposure to sunlight, subsequent encounter: Secondary | ICD-10-CM | POA: Diagnosis not present

## 2018-10-21 DIAGNOSIS — L570 Actinic keratosis: Secondary | ICD-10-CM | POA: Diagnosis not present

## 2018-10-21 DIAGNOSIS — C44319 Basal cell carcinoma of skin of other parts of face: Secondary | ICD-10-CM | POA: Diagnosis not present

## 2018-11-02 ENCOUNTER — Other Ambulatory Visit: Payer: Self-pay

## 2018-11-02 ENCOUNTER — Ambulatory Visit (INDEPENDENT_AMBULATORY_CARE_PROVIDER_SITE_OTHER): Payer: Medicare Other

## 2018-11-02 DIAGNOSIS — I350 Nonrheumatic aortic (valve) stenosis: Secondary | ICD-10-CM

## 2018-11-11 ENCOUNTER — Encounter: Payer: Self-pay | Admitting: Cardiology

## 2018-11-11 ENCOUNTER — Other Ambulatory Visit: Payer: Self-pay

## 2018-11-11 ENCOUNTER — Ambulatory Visit (INDEPENDENT_AMBULATORY_CARE_PROVIDER_SITE_OTHER): Payer: Medicare Other | Admitting: Cardiology

## 2018-11-11 VITALS — BP 158/75 | HR 74 | Ht 64.0 in | Wt 225.0 lb

## 2018-11-11 DIAGNOSIS — I350 Nonrheumatic aortic (valve) stenosis: Secondary | ICD-10-CM

## 2018-11-11 DIAGNOSIS — I5032 Chronic diastolic (congestive) heart failure: Secondary | ICD-10-CM

## 2018-11-11 DIAGNOSIS — I10 Essential (primary) hypertension: Secondary | ICD-10-CM

## 2018-11-11 DIAGNOSIS — Z6838 Body mass index (BMI) 38.0-38.9, adult: Secondary | ICD-10-CM | POA: Diagnosis not present

## 2018-11-11 NOTE — Progress Notes (Signed)
Virtual Visit via Telephone Note: Patient unable to use video assisted device.  This visit type was conducted due to national recommendations for restrictions regarding the COVID-19 Pandemic (e.g. social distancing).  This format is felt to be most appropriate for this patient at this time.  All issues noted in this document were discussed and addressed.  No physical exam was performed.  The patient has consented to conduct a Telehealth visit and understands insurance will be billed.   I connected with@, on 11/11/18 at  by TELEPHONE and verified that I am speaking with the correct person using two identifiers.   I discussed the limitations of evaluation and management by telemedicine and the availability of in person appointments. The patient expressed understanding and agreed to proceed.   I have discussed with patient regarding the safety during COVID Pandemic and steps and precautions to be taken including social distancing, frequent hand wash and use of detergent soap, gels with the patient. I asked the patient to avoid touching mouth, nose, eyes, ears with the hands. I encouraged regular walking around the neighborhood and exercise and regular diet, as long as social distancing can be maintained.  Primary Physician/Referring:  [name], MD  Patient ID: [name], female    DOB: 01/19/1931, 85 y.o.   MRN: [id number]  Chief Complaint  Patient presents with  . Aortic Stenosis  . Results    echo  . Follow-up    54yr  HPI:    HPI: [name] is a 85y.o. female  aortic stenosis, hypertension, diabetes mellitus, morbid obesity, and sleep apnea. She is here for follow-up of aortic stenosis, hypertension, and shortness of breath. She reports no symptoms of shortness of breath, orthopnea, chest pain, light-headedness, or edema. She was recently hospitalized 2 weeks ago for COPD exacerbation and cellulitis of the right leg. She was given prednisone for wheezing and IV antibiotics for  cellulitis. She feels back to normal and denies difficulties with breathing or leg swelling since her hospital visit. She wears supplemental oxygen at night for sleep apnea as she cannot tolerate CPAP.   [place] with leg edema and slightly worsening dyspnea, she did take furosemide for a week with complete resolution of leg edema and also dyspnea.  She is back to her baseline.  Underwent echocardiogram and this is a telephone encounter/virtual visit.  Denies chest pain or palpitations.  Continues to remain active.  No change in her weight.  Denies leg edema.  Past Medical History:  Diagnosis Date  . [place]   . COPD [place]ronic obstructive pulmonary disease) ([place]   . Diabetes mellitus   . Glaucoma   . Hyperlipidemia   . Hypertension    Past Surgical History:  Procedure Laterality Date  . BACK SURGERY    . CARDIAC CATHETERIZATION N/A 12/09/2015   Procedure: Right Heart Cath;  Surgeon: JAdrian Prows MD;  [facility];  Service: Cardiovascular;  Laterality: N/A;  . CHOLECYSTECTOMY  2000  . HERNIA REPAIR  2007  . rupture disk  1970"s  . SHOULDER ARTHROSCOPY WITH ROTATOR CUFF REPAIR  1999   rt shoulder   Social History   Socioeconomic History  . Marital status: Married    Spouse name: Not on file  . Number of children: 5  . Years of education: Not on file  . Highest education level: Not on file  Occupational History  . Not on file  Social Needs  . Financial resource strain: Not on file  . Food insecurity    Worry: Not on file    Inability: Not on file  . Transportation needs    Medical: Not on file    Non-medical: Not on file  Tobacco Use  . Smoking status: Never Smoker  . Smokeless tobacco: Never Used  Substance and Sexual Activity  . Alcohol use: No  . Drug use: No  . Sexual activity: Not on file  Lifestyle  . Physical activity    Days per week: Not on file    Minutes per session: Not on file  .  Stress: Not on file  Relationships  . Social connections    Talks on phone: Not on file    Gets together: Not on file    Attends religious service: Not on file    Active member of club or organization: Not on file    Attends meetings of clubs or organizations: Not on file    Relationship status: Not on file  . Intimate partner violence    Fear of current or ex partner: Not on file    Emotionally abused: Not on file    Physically abused: Not on file    Forced sexual activity: Not on file  Other Topics Concern  . Not on file  Social History Narrative  . Not on file   ROS  Review of Systems  Constitution: Negative for chills, decreased appetite, malaise/fatigue and weight gain.  Cardiovascular: Positive for dyspnea on exertion. Negative for leg swelling and syncope.  Endocrine: Negative for cold intolerance.  Hematologic/Lymphatic: Does not bruise/bleed easily.  Musculoskeletal: Negative for joint swelling.  Gastrointestinal: Negative for abdominal pain, anorexia, change in bowel habit, hematochezia and melena.  Neurological: Negative for headaches and light-headedness.  Psychiatric/Behavioral: Negative for depression and substance abuse.  All other systems reviewed and are negative.  Objective  Blood pressure (!) 158/75, pulse 74, height 5' 4" (1.626 m), weight 225 lb (102.1 kg). Body mass index is 38.62 kg/m.  Physical exam not performed or limited due to virtual visit. Please see exam details from prior visit is as below.  Physical Exam  Constitutional: She appears well-developed. No distress.  Morbidly obese  HENT:  Head: Atraumatic.  Eyes: Conjunctivae are normal.  Neck: Neck supple. No thyromegaly present.  Short neck and difficult to evaluate JVP  Cardiovascular: Normal rate, regular rhythm, intact distal pulses and normal pulses. Exam reveals no gallop.  Murmur heard. High-pitched early systolic murmur is present with a grade of 2/6 at the upper right sternal border.  Pulses:      Carotid pulses are 2+ on the right side with bruit and 2+ on the left side with bruit.      Dorsalis pedis pulses are 2+ on the right side and 2+ on the left side.       Posterior tibial pulses are 2+ on the right side and 2+ on the left side.  Femoral and popliteal pulse difficult to feel due to patient's body habitus.  No edema.   Pulmonary/Chest: Effort normal and breath sounds normal.  Abdominal: Soft. Bowel sounds are normal.  Obese. Pannus present  Musculoskeletal: Normal range of motion.        General: No edema.  Neurological: She is alert.  Skin: Skin is warm and dry.  Psychiatric: She has a normal mood and affect.   Radiology: No results found.  Laboratory examination:   Labs 11/05/2017: H/H 10.4/33.5.  MCV   93.  Platelets clumped and could not be calculated. Glucose 125.  BUN/creatinine 15/1.2.  EGFR 40.  Sodium 137, potassium 4.3. BNP 452.  CMP Latest Ref Rng & Units 11/05/2017 11/04/2017 11/03/2017  Glucose 70 - 99 mg/dL 125(H) 122(H) 163(H)  BUN 8 - 23 mg/dL 15 23 22  Creatinine 0.44 - 1.00 mg/dL 1.20(H) 1.37(H) 1.17(H)  Sodium 135 - 145 mmol/L 137 137 136  Potassium 3.5 - 5.1 mmol/L 4.3 4.4 4.4  Chloride 98 - 111 mmol/L 101 102 101  CO2 22 - 32 mmol/L 25 26 25  Calcium 8.9 - 10.3 mg/dL 9.3 8.3(L) 8.2(L)  Total Protein 6.5 - 8.1 g/dL - 5.5(L) -  Total Bilirubin 0.3 - 1.2 mg/dL - 0.6 -  Alkaline Phos 38 - 126 U/L - 58 -  AST 15 - 41 U/L - 14(L) -  ALT 0 - 44 U/L - 12 -   CBC Latest Ref Rng & Units 11/05/2017 11/04/2017 11/03/2017  WBC 4.0 - 10.5 K/uL 9.5 8.6 17.1(H)  Hemoglobin 12.0 - 15.0 g/dL 10.4(L) 9.6(L) 9.6(L)  Hematocrit 36.0 - 46.0 % 33.5(L) 32.0(L) 31.0(L)  Platelets 150 - 400 K/uL PLATELET CLUMPS NOTED ON SMEAR, UNABLE TO ESTIMATE PLATELET CLUMPS NOTED ON SMEAR, UNABLE TO ESTIMATE PLATELET CLUMPS NOTED ON SMEAR, UNABLE TO ESTIMATE   Lipid Panel     Component Value Date/Time   CHOL 131 10/26/2016 0912   TRIG 117 10/26/2016 0912   HDL 34 (L)  10/26/2016 0912   CHOLHDL 3.9 10/26/2016 0912   VLDL 23 10/26/2016 0912   LDLCALC 74 10/26/2016 0912   HEMOGLOBIN A1C Lab Results  Component Value Date   HGBA1C 6.7 (H) 10/24/2016   MPG 146 10/24/2016   TSH No results for input(s): TSH in the last 8760 hours. Medications   Current Outpatient Medications  Medication Instructions  . acetaminophen (TYLENOL) 650 mg, Oral, Every 8 hours PRN  . albuterol (PROVENTIL HFA;VENTOLIN HFA) 108 (90 Base) MCG/ACT inhaler 2 puffs, Inhalation, Every 6 hours PRN  . amLODipine (NORVASC) 2.5 MG tablet TAKE 2.5 MG BY MOUTH DAILY  . aspirin EC 81 mg, Oral, Daily  . brinzolamide (AZOPT) 1 % ophthalmic suspension 1 drop, Both Eyes, 2 times daily  . isosorbide mononitrate (IMDUR) 60 MG 24 hr tablet TAKE 1 TABLET DAILY  . Liraglutide (VICTOZA [place]) 0.6 mg, Subcutaneous, Daily  . LUMIGAN 0.01 % SOLN 1 drop, Both Eyes, Daily at bedtime  . metFORMIN (GLUCOPHAGE) 500 mg, Oral, Daily  . pioglitazone (ACTOS) 15 mg, Oral, Daily  . telmisartan (MICARDIS) 80 mg, Oral, Daily    Cardiac Studies:   Nuclear stress test [05/24/2015]:  1. The resting electrocardiogram demonstrated normal sinus rhythm, normal resting conduction and no resting arrhythmias. Stress EKG is non-diagnostic for ischemia as it a pharmacologic stress using Lexiscan. Stress symptoms included dyspnea. 2. Myocardial perfusion imaging is normal. Overall left ventricular systolic function was normal without regional wall motion abnormalities. The left ventricular ejection fraction was 70%. No significant change from 12/19/12.  Carotid Doppler [12/08/2016]: No hemodynamically significant arterial disease in the internal carotid artery bilaterally. There is mixed plaque bilateral bulb. Antegrade right vertebral artery flow. Antegrade left vertebral artery flow.  Coronary Angiogram [12/09/2015]: Right Heart Cath;Mild to moderate pulmonary hypertension. RA 11/10/8, RV 52/5, EDP 11, PA 54/20, mean 34  mmHg. PA saturation 58%. PW 19/25, mean 17 mmHg. Post 600 g nitroglycerin: PA pressure 40/9, mean 23 mmHg. PW 11/11, mean 8 mmHg. CO 5.76, CI 2.77. QP/QS 0.84.  Echocardiogram 11/02/2018: Normal   LV systolic function with EF 61%. Left ventricle cavity is normal in size. Normal left ventricular wall thickness. Normal global wall motion. Doppler evidence of grade I (impaired) diastolic dysfunction, normal LAP.  Trileaflet aortic valve with mild aortic valve leaflet calcification. Mild aortic valve stenosis. Aortic valve mean gradient of 11 mmHg, Vmax of 2.3  m/s. Calculated aortic valve area by continuity equation is 1.3 cm. Mild (Grade I) aortic regurgitation. Mild to moderate mitral regurgitation. Moderate tricuspid regurgitation. Estimated pulmonary artery systolic pressure is 46 mmHg.  Compared to previous study on 10/25/2016, tricuspid regurgitation is increased in severity. Pulmonary hypertension is new.   Assessment     ICD-10-CM   1. Chronic diastolic CHF (congestive heart failure) (HCC)  I50.32   2. Mild aortic stenosis  I35.0   3. Essential hypertension  I10   4. Class 2 severe obesity due to excess calories with serious comorbidity and body mass index (BMI) of 38.0 to 38.9 in adult [facility])  E66.01    Z68.38     EKG 11/19/2017: Sinus rhythm 80 bpm. Left axis deviation. First degree AV block. Poor R-wave progression. Nonspecific T wave changes lead aVL.   Recommendations:   Patient with an episode of acute decompensated heart failure sometime in March 2020 and took Lasix for 1 week with complete resolution and no recurrence.  Her dyspnea is back to baseline and she is presently stable.  Blood pressure is well controlled.  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  Advised her to again weight loss and regular exercise.  Her labs have been stable, no  changes in the medications were done today.  I will see her back in 6 months.  Adrian Prows, MD, [facility] 11/11/2018, 10:36 AM [facility]. [place] Pager: [phone] Office: [phone] If no answer Cell [phone]

## 2018-11-21 DIAGNOSIS — E538 Deficiency of other specified B group vitamins: Secondary | ICD-10-CM | POA: Diagnosis not present

## 2018-11-21 DIAGNOSIS — I10 Essential (primary) hypertension: Secondary | ICD-10-CM | POA: Diagnosis not present

## 2018-11-21 DIAGNOSIS — E119 Type 2 diabetes mellitus without complications: Secondary | ICD-10-CM | POA: Diagnosis not present

## 2018-11-21 DIAGNOSIS — E1140 Type 2 diabetes mellitus with diabetic neuropathy, unspecified: Secondary | ICD-10-CM | POA: Diagnosis not present

## 2018-11-25 ENCOUNTER — Other Ambulatory Visit: Payer: Self-pay

## 2019-01-03 DIAGNOSIS — E538 Deficiency of other specified B group vitamins: Secondary | ICD-10-CM | POA: Diagnosis not present

## 2019-01-03 DIAGNOSIS — I10 Essential (primary) hypertension: Secondary | ICD-10-CM | POA: Diagnosis not present

## 2019-01-03 DIAGNOSIS — E1140 Type 2 diabetes mellitus with diabetic neuropathy, unspecified: Secondary | ICD-10-CM | POA: Diagnosis not present

## 2019-01-09 DIAGNOSIS — D649 Anemia, unspecified: Secondary | ICD-10-CM | POA: Diagnosis not present

## 2019-01-09 DIAGNOSIS — Z7189 Other specified counseling: Secondary | ICD-10-CM | POA: Diagnosis not present

## 2019-01-09 DIAGNOSIS — E119 Type 2 diabetes mellitus without complications: Secondary | ICD-10-CM | POA: Diagnosis not present

## 2019-01-09 DIAGNOSIS — Z23 Encounter for immunization: Secondary | ICD-10-CM | POA: Diagnosis not present

## 2019-01-09 DIAGNOSIS — I10 Essential (primary) hypertension: Secondary | ICD-10-CM | POA: Diagnosis not present

## 2019-02-17 ENCOUNTER — Encounter (HOSPITAL_COMMUNITY): Payer: Self-pay

## 2019-02-17 ENCOUNTER — Other Ambulatory Visit: Payer: Self-pay

## 2019-02-17 ENCOUNTER — Ambulatory Visit (HOSPITAL_COMMUNITY)
Admission: EM | Admit: 2019-02-17 | Discharge: 2019-02-17 | Disposition: A | Discharge status: Home / Self Care | Payer: Medicare Other | Attending: Emergency Medicine | Admitting: Emergency Medicine

## 2019-02-17 DIAGNOSIS — S81812A Laceration without foreign body, left lower leg, initial encounter: Secondary | ICD-10-CM | POA: Diagnosis not present

## 2019-02-17 MED ORDER — LIDOCAINE-EPINEPHRINE-TETRACAINE (LET) SOLUTION
NASAL | Status: AC
  Filled 2019-02-17: qty 3

## 2019-02-17 NOTE — ED Triage Notes (Signed)
Patient presents to Urgent Care with complaints of leg lac on the left side since this morning when some wood fell on her. Patient reports she is not on blood thinners, pt thinks she got her tetanus shot last year. Bleeding appears to be controlled upon arrival.

## 2019-02-17 NOTE — Discharge Instructions (Addendum)
Keep your wound clean and dry.  Wash it gently twice a day with soap and water.      Return here if you see signs of infection, such as increased pain, redness, pus-like drainage, warmth, fever, chills, or other concerning symptoms.

## 2019-02-17 NOTE — ED Provider Notes (Signed)
[place]    CSN: [id number] Arrival date & time: 02/17/19  1659      History   Chief Complaint Chief Complaint  Patient presents with  . Extremity Laceration    HPI [name] is a 85 y.o. female.   Accompanied by her husband, patient presents with a skin tear on her left lower leg, which occurred this morning when she dropped a piece of wood on it.  She attempted treatment at home with pressure and a bandage but states the wound has continued to ooze blood.  She takes a daily ECASA but denies other anticoagulants.  She denies head injury or LOC.  She reports her last tetanus was 1 year ago.  Medical history is significant for diabetes.  The history is provided by the patient.    Past Medical History:  Diagnosis Date  . CHF (congestive heart failure) ([place])   . COPD (chronic obstructive pulmonary disease) ([place])   . Diabetes mellitus   . Glaucoma   . Hyperlipidemia   . Hypertension     Patient Active Problem List   Diagnosis Date Noted  . Essential hypertension 11/03/2017  . Sepsis ([place]) 11/03/2017  . Type II diabetes mellitus with renal manifestations ([place]) 11/03/2017  . Cellulitis of right leg 11/03/2017  . Flank pain 11/03/2017  . Chronic diastolic CHF (congestive heart failure) ([place]) 03/19/2017  . Diabetes mellitus ([place]) 03/19/2017  . Obesity, diabetes, and hypertension syndrome ([place]) 03/19/2017  . CKD (chronic kidney disease) stage 3, GFR 30-59 ml/min 03/19/2017  . Syncope 10/24/2016  . Pulmonary hypertension ([place]) 12/07/2015  . Varicose veins of leg with complications [id number]  . Varicose veins of left lower extremity with complications [id number]  . Varicose veins of lower extremities with other complications [id number]  . Lipoma of back 07/19/2012    Past Surgical History:  Procedure Laterality Date  . BACK SURGERY    . CARDIAC CATHETERIZATION N/A 12/09/2015   Procedure: Right Heart Cath;  Surgeon: Adrian Prows, MD;  Location: [facility];  Service: Cardiovascular;  Laterality: N/A;  . CHOLECYSTECTOMY  2000  . HERNIA REPAIR  2007  . rupture disk  1970"s  . SHOULDER ARTHROSCOPY WITH ROTATOR CUFF REPAIR  1999   rt shoulder    OB History   No obstetric history on file.      Home Medications    Prior to Admission medications   Medication Sig Start Date End Date Taking? Authorizing Provider  acetaminophen (TYLENOL) 650 MG CR tablet Take 650 mg by mouth every 8 (eight) hours as needed for pain.    [provider]  albuterol (PROVENTIL HFA;VENTOLIN HFA) 108 (90 Base) MCG/ACT inhaler Inhale 2 puffs into the lungs every 6 (six) hours as needed for wheezing or shortness of breath. 11/06/17   Langston Masker B, PA-C  amLODipine (NORVASC) 2.5 MG tablet TAKE 2.5 MG BY MOUTH DAILY 06/09/17   [provider]  aspirin EC 81 MG tablet Take 81 mg by mouth daily.    [provider]  brinzolamide (AZOPT) 1 % ophthalmic suspension Place 1 drop into both eyes 2 (two) times daily.    [provider]  isosorbide mononitrate (IMDUR) 60 MG 24 hr tablet TAKE 1 TABLET DAILY 10/03/18   Adrian Prows, MD  Liraglutide (VICTOZA [place]) Inject 0.6 mg into the skin daily.     [provider]  LUMIGAN 0.01 % SOLN Place 1 drop into both eyes at bedtime.  12/28/16   [provider]  metFORMIN (GLUCOPHAGE) 1000 MG tablet Take 0.5 tablets (500 mg total) by mouth daily. 10/27/16   [name], MD  pioglitazone (ACTOS) 30 MG tablet Take 15 mg by mouth daily.    [provider]  telmisartan (MICARDIS) 80 MG tablet Take 80 mg by mouth daily.    [provider]    Family History Family History  Problem Relation Age of Onset  . Stroke Mother     Social History Social History   Tobacco Use  . Smoking status: Never Smoker  . Smokeless tobacco: Never Used  Substance Use Topics  . Alcohol use: No  . Drug use: No     Allergies   Cephalexin   Review of Systems Review of Systems   Constitutional: Negative for chills and fever.  HENT: Negative for ear pain and sore throat.   Eyes: Negative for pain and visual disturbance.  Respiratory: Negative for cough and shortness of breath.   Cardiovascular: Negative for chest pain and palpitations.  Gastrointestinal: Negative for abdominal pain and vomiting.  Genitourinary: Negative for dysuria and hematuria.  Musculoskeletal: Negative for arthralgias and back pain.  Skin: Positive for wound. Negative for color change and rash.  Neurological: Negative for seizures and syncope.  All other systems reviewed and are negative.    Physical Exam Triage Vital Signs ED Triage Vitals  Enc Vitals Group     BP      Pulse      Resp      Temp      Temp src      SpO2      Weight      Height      Head Circumference      Peak Flow      Pain Score      Pain Loc      Pain Edu?      Excl. in [place]?    No data found.  Updated Vital Signs BP (!) 181/73 (BP Location: Right Arm)   Pulse 93   Temp 98.2 F (36.8 C) (Oral)   Resp 18   SpO2 98%   Visual Acuity Right Eye Distance:   Left Eye Distance:   Bilateral Distance:    Right Eye Near:   Left Eye Near:    Bilateral Near:     Physical Exam Vitals signs and nursing note reviewed.  Constitutional:      General: She is not in acute distress.    Appearance: She is well-developed.  HENT:     Head: Normocephalic and atraumatic.  Eyes:     Conjunctiva/sclera: Conjunctivae normal.  Neck:     Musculoskeletal: Neck supple.  Cardiovascular:     Rate and Rhythm: Normal rate and regular rhythm.     Heart sounds: No murmur.  Pulmonary:     Effort: Pulmonary effort is normal. No respiratory distress.     Breath sounds: Normal breath sounds.  Abdominal:     Palpations: Abdomen is soft.     Tenderness: There is no abdominal tenderness. There is no guarding or rebound.  Musculoskeletal:       Legs:  Skin:    General: Skin is warm and dry.     Findings: No erythema.      Comments: Skin tear on left lower leg.  No active bleeding.  No purulent drainage.  No erythema or streaks.  Neurological:     General: No focal deficit present.     Mental Status: She  is alert and oriented to person, place, and time.  Psychiatric:        Mood and Affect: Mood normal.        Behavior: Behavior normal.      UC Treatments / Results  Labs (all labs ordered are listed, but only abnormal results are displayed) Labs Reviewed - No data to display  EKG   Radiology No results found.  Procedures Laceration Repair  Date/Time: 02/17/2019 6:43 PM Performed by: Sharion Balloon, NP Authorized by: Sharion Balloon, NP   Consent:    Consent obtained:  Verbal   Consent given by:  Patient Anesthesia (see MAR for exact dosages):    Anesthesia method:  Topical application Laceration details:    Location:  Leg   Leg location:  L lower leg   Length (cm):  5   Depth (mm):  1 Exploration:    Hemostasis achieved with:  LET   Wound exploration: entire depth of wound probed and visualized   Treatment:    Area cleansed with:  Shur-Clens   Irrigation solution:  Sterile saline   Irrigation method:  Syringe Skin repair:    Repair method:  Tissue adhesive Post-procedure details:    Dressing:  Non-adherent dressing   (including critical care time)  Medications Ordered in UC Medications  lidocaine-EPINEPHrine-tetracaine (LET) solution (has no administration in time range)    Initial Impression / Assessment and Plan / UC Course  I have reviewed the triage vital signs and the nursing notes.  Pertinent labs & imaging results that were available during my care of the patient were reviewed by me and considered in my medical decision making (see chart for details).     Left lower leg laceration/skin tear.  Repaired with Dermabond.  Wound care instructions and signs of infection discussed at length with patient.  Patient is up-to-date on her tetanus.  Instructed patient to return  here if she notes signs of infection.  Patient agrees to plan of care.     Final Clinical Impressions(s) / UC Diagnoses   Final diagnoses:  Laceration of left lower extremity, initial encounter     Discharge Instructions     Keep your wound clean and dry.  Wash it gently twice a day with soap and water.      Return here if you see signs of infection, such as increased pain, redness, pus-like drainage, warmth, fever, chills, or other concerning symptoms.       ED Prescriptions    None     PDMP not reviewed this encounter.   Sharion Balloon, NP 02/17/19 [phone]

## 2019-02-21 ENCOUNTER — Encounter (HOSPITAL_COMMUNITY): Payer: Self-pay

## 2019-02-21 ENCOUNTER — Ambulatory Visit (HOSPITAL_COMMUNITY)
Admission: EM | Admit: 2019-02-21 | Discharge: 2019-02-21 | Disposition: A | Discharge status: Home / Self Care | Payer: Medicare Other | Attending: Emergency Medicine | Admitting: Emergency Medicine

## 2019-02-21 DIAGNOSIS — L03116 Cellulitis of left lower limb: Secondary | ICD-10-CM

## 2019-02-21 MED ORDER — DOXYCYCLINE HYCLATE 100 MG PO CAPS: 100.0000 mg | ORAL_CAPSULE | Freq: Two times a day (BID) | ORAL | 0 refills | Status: DC

## 2019-02-21 NOTE — Discharge Instructions (Signed)
Take the antibiotic doxycycline twice a day for 10 days.    Keep your wound clean and dry.  Wash it gently twice a day with soap and water.  Apply an antibiotic cream twice a day.    Follow-up with your doctor for a wound recheck in 2 days.  If your doctor is not able to see you, return here for a wound recheck in 2 days.  Return here sooner if you have increased pain, redness, pus-like drainage, warmth, fever, chills, or other concerning symptoms.

## 2019-02-21 NOTE — ED Provider Notes (Signed)
[place]    CSN: [id number] Arrival date & time: 02/21/19  1031      History   Chief Complaint Chief Complaint  Patient presents with  . Leg Pain    HPI [name] is a 85 y.o. female.   Patient presents with increased pain and redness in her left lower leg.  She was seen here on 02/17/2019 and treated for a skin laceration/tear which was repaired with Dermabond.  She denies drainage from the wound.  She denies fever or chills.  She reports she has been following wound care instructions given.  The history is provided by the patient.    Past Medical History:  Diagnosis Date  . CHF (congestive heart failure) ([place])   . COPD (chronic obstructive pulmonary disease) ([place])   . Diabetes mellitus   . Glaucoma   . Hyperlipidemia   . Hypertension     Patient Active Problem List   Diagnosis Date Noted  . Essential hypertension 11/03/2017  . Sepsis ([place]) 11/03/2017  . Type II diabetes mellitus with renal manifestations ([place]) 11/03/2017  . Cellulitis of right leg 11/03/2017  . Flank pain 11/03/2017  . Chronic diastolic CHF (congestive heart failure) ([place]) 03/19/2017  . Diabetes mellitus ([place]) 03/19/2017  . Obesity, diabetes, and hypertension syndrome ([place]) 03/19/2017  . CKD (chronic kidney disease) stage 3, GFR 30-59 ml/min 03/19/2017  . Syncope 10/24/2016  . Pulmonary hypertension ([place]) 12/07/2015  . Varicose veins of leg with complications [id number]  . Varicose veins of left lower extremity with complications [id number]  . Varicose veins of lower extremities with other complications [id number]  . Lipoma of back 07/19/2012    Past Surgical History:  Procedure Laterality Date  . BACK SURGERY    . CARDIAC CATHETERIZATION N/A 12/09/2015   Procedure: Right Heart Cath;  Surgeon: Adrian Prows, MD;  Location: [facility];  Service: Cardiovascular;  Laterality: N/A;  . CHOLECYSTECTOMY  2000  . HERNIA REPAIR  2007  . rupture disk  1970"s  . SHOULDER  ARTHROSCOPY WITH ROTATOR CUFF REPAIR  1999   rt shoulder    OB History   No obstetric history on file.      Home Medications    Prior to Admission medications   Medication Sig Start Date End Date Taking? Authorizing Provider  acetaminophen (TYLENOL) 650 MG CR tablet Take 650 mg by mouth every 8 (eight) hours as needed for pain.    [provider]  albuterol (PROVENTIL HFA;VENTOLIN HFA) 108 (90 Base) MCG/ACT inhaler Inhale 2 puffs into the lungs every 6 (six) hours as needed for wheezing or shortness of breath. 11/06/17   Langston Masker B, PA-C  amLODipine (NORVASC) 2.5 MG tablet TAKE 2.5 MG BY MOUTH DAILY 06/09/17   [provider]  aspirin EC 81 MG tablet Take 81 mg by mouth daily.    [provider]  brinzolamide (AZOPT) 1 % ophthalmic suspension Place 1 drop into both eyes 2 (two) times daily.    [provider]  doxycycline (VIBRAMYCIN) 100 MG capsule Take 1 capsule (100 mg total) by mouth 2 (two) times daily. 02/21/19   Sharion Balloon, NP  isosorbide mononitrate (IMDUR) 60 MG 24 hr tablet TAKE 1 TABLET DAILY 10/03/18   Adrian Prows, MD  Liraglutide (VICTOZA [place]) Inject 0.6 mg into the skin daily.     [provider]  LUMIGAN 0.01 % SOLN Place 1 drop into both eyes at bedtime.  12/28/16   [provider]  metFORMIN (GLUCOPHAGE) 1000 MG tablet Take 0.5 tablets (500 mg total) by mouth daily. 10/27/16   [name], MD  pioglitazone (ACTOS) 30 MG tablet Take 15 mg by mouth daily.    [provider]  telmisartan (MICARDIS) 80 MG tablet Take 80 mg by mouth daily.    [provider]    Family History Family History  Problem Relation Age of Onset  . Stroke Mother     Social History Social History   Tobacco Use  . Smoking status: Never Smoker  . Smokeless tobacco: Never Used  Substance Use Topics  . Alcohol use: No  . Drug use: No     Allergies   Cephalexin   Review of Systems Review of Systems   Constitutional: Negative for chills and fever.  HENT: Negative for ear pain and sore throat.   Eyes: Negative for pain and visual disturbance.  Respiratory: Negative for cough and shortness of breath.   Cardiovascular: Negative for chest pain and palpitations.  Gastrointestinal: Negative for abdominal pain and vomiting.  Genitourinary: Negative for dysuria and hematuria.  Musculoskeletal: Negative for arthralgias and back pain.  Skin: Positive for color change and wound.  Neurological: Negative for seizures and syncope.  All other systems reviewed and are negative.    Physical Exam Triage Vital Signs ED Triage Vitals  Enc Vitals Group     BP 02/21/19 1113 (!) 152/63     Pulse Rate 02/21/19 1113 81     Resp 02/21/19 1113 18     Temp 02/21/19 1113 97.7 F (36.5 C)     Temp Source 02/21/19 1113 Oral     SpO2 02/21/19 1113 100 %     Weight 02/21/19 1114 285 lb (129.3 kg)     Height --      Head Circumference --      Peak Flow --      Pain Score 02/21/19 1114 6     Pain Loc --      Pain Edu? --      Excl. in [place]? --    No data found.  Updated Vital Signs BP (!) 152/63 (BP Location: Right Arm)   Pulse 81   Temp 97.7 F (36.5 C) (Oral)   Resp 18   Wt 285 lb (129.3 kg)   SpO2 100%   BMI 48.92 kg/m   Visual Acuity Right Eye Distance:   Left Eye Distance:   Bilateral Distance:    Right Eye Near:   Left Eye Near:    Bilateral Near:     Physical Exam Vitals signs and nursing note reviewed.  Constitutional:      General: She is not in acute distress.    Appearance: She is well-developed.  HENT:     Head: Normocephalic and atraumatic.  Eyes:     Conjunctiva/sclera: Conjunctivae normal.  Neck:     Musculoskeletal: Neck supple.  Cardiovascular:     Rate and Rhythm: Normal rate and regular rhythm.     Heart sounds: No murmur.  Pulmonary:     Effort: Pulmonary effort is normal. No respiratory distress.     Breath sounds: Normal breath sounds.  Abdominal:      Palpations: Abdomen is soft.     Tenderness: There is no abdominal tenderness.  Skin:    General: Skin is warm and dry.     Findings: Erythema present.     Comments: Skin tear/laceration on LLE: no drainage. Surrounding erythema extending to ankle and up to mid-shin.  Neurological:     General: No focal deficit present.     Mental Status: She is alert and oriented to person, place, and time.     Sensory: No sensory deficit.     Motor: No weakness.      UC Treatments / Results  Labs (all labs ordered are listed, but only abnormal results are displayed) Labs Reviewed - No data to display  EKG   Radiology No results found.  Procedures Procedures (including critical care time)  Medications Ordered in UC Medications - No data to display  Initial Impression / Assessment and Plan / UC Course  I have reviewed the triage vital signs and the nursing notes.  Pertinent labs & imaging results that were available during my care of the patient were reviewed by me and considered in my medical decision making (see chart for details).    Cellulitis of left lower extremity.  Instructed patient to start doxycycline today.  Instructed her to apply an antibiotic cream to the wound twice a day when she is doing her dressing changes.  Instructed her to follow-up with her PCP or return here for a wound recheck in 2 days.  Instructed her to return sooner if she has increased pain, redness, purulent drainage, fever, chills, warmth, or other concerns.  Patient agrees to plan of care.     Final Clinical Impressions(s) / UC Diagnoses   Final diagnoses:  Cellulitis of left lower extremity     Discharge Instructions     Take the antibiotic doxycycline twice a day for 10 days.    Keep your wound clean and dry.  Wash it gently twice a day with soap and water.  Apply an antibiotic cream twice a day.    Follow-up with your doctor for a wound recheck in 2 days.  If your doctor is not able to see  you, return here for a wound recheck in 2 days.  Return here sooner if you have increased pain, redness, pus-like drainage, warmth, fever, chills, or other concerning symptoms.         ED Prescriptions    Medication Sig Dispense Auth. Provider   doxycycline (VIBRAMYCIN) 100 MG capsule Take 1 capsule (100 mg total) by mouth 2 (two) times daily. 20 capsule Sharion Balloon, NP     PDMP not reviewed this encounter.   Sharion Balloon, NP 02/21/19 1153

## 2019-02-21 NOTE — ED Triage Notes (Signed)
Pt states she has left leg pain. The pain has gotten worst since last Friday.

## 2019-02-23 DIAGNOSIS — L0390 Cellulitis, unspecified: Secondary | ICD-10-CM | POA: Diagnosis not present

## 2019-02-23 DIAGNOSIS — I10 Essential (primary) hypertension: Secondary | ICD-10-CM | POA: Diagnosis not present

## 2019-02-23 DIAGNOSIS — E119 Type 2 diabetes mellitus without complications: Secondary | ICD-10-CM | POA: Diagnosis not present

## 2019-02-26 ENCOUNTER — Encounter (HOSPITAL_COMMUNITY): Payer: Self-pay | Admitting: Emergency Medicine

## 2019-02-26 ENCOUNTER — Other Ambulatory Visit: Payer: Self-pay

## 2019-02-26 ENCOUNTER — Inpatient Hospital Stay (HOSPITAL_COMMUNITY)
Admission: AD | Admit: 2019-02-26 | Discharge: 2019-02-28 | DRG: 603 | Disposition: A | Discharge status: Home / Self Care | Payer: Medicare Other | Attending: Internal Medicine | Admitting: Internal Medicine

## 2019-02-26 ENCOUNTER — Inpatient Hospital Stay (HOSPITAL_COMMUNITY)
Admission: EM | Admit: 2019-02-26 | Discharge: 2019-02-26 | Disposition: A | Discharge status: Still In Hospital | Payer: Medicare Other | Source: Home / Self Care | Attending: Internal Medicine | Admitting: Internal Medicine

## 2019-02-26 DIAGNOSIS — L0390 Cellulitis, unspecified: Secondary | ICD-10-CM | POA: Diagnosis present

## 2019-02-26 DIAGNOSIS — W208XXA Other cause of strike by thrown, projected or falling object, initial encounter: Secondary | ICD-10-CM | POA: Diagnosis present

## 2019-02-26 DIAGNOSIS — I10 Essential (primary) hypertension: Secondary | ICD-10-CM | POA: Diagnosis not present

## 2019-02-26 DIAGNOSIS — Z79899 Other long term (current) drug therapy: Secondary | ICD-10-CM | POA: Diagnosis not present

## 2019-02-26 DIAGNOSIS — Z7982 Long term (current) use of aspirin: Secondary | ICD-10-CM

## 2019-02-26 DIAGNOSIS — E1121 Type 2 diabetes mellitus with diabetic nephropathy: Secondary | ICD-10-CM | POA: Diagnosis not present

## 2019-02-26 DIAGNOSIS — Z6837 Body mass index (BMI) 37.0-37.9, adult: Secondary | ICD-10-CM | POA: Diagnosis not present

## 2019-02-26 DIAGNOSIS — E119 Type 2 diabetes mellitus without complications: Secondary | ICD-10-CM

## 2019-02-26 DIAGNOSIS — G4733 Obstructive sleep apnea (adult) (pediatric): Secondary | ICD-10-CM | POA: Diagnosis present

## 2019-02-26 DIAGNOSIS — I350 Nonrheumatic aortic (valve) stenosis: Secondary | ICD-10-CM | POA: Diagnosis present

## 2019-02-26 DIAGNOSIS — Z881 Allergy status to other antibiotic agents status: Secondary | ICD-10-CM | POA: Diagnosis not present

## 2019-02-26 DIAGNOSIS — L03116 Cellulitis of left lower limb: Secondary | ICD-10-CM | POA: Diagnosis present

## 2019-02-26 DIAGNOSIS — Z7984 Long term (current) use of oral hypoglycemic drugs: Secondary | ICD-10-CM

## 2019-02-26 DIAGNOSIS — Z823 Family history of stroke: Secondary | ICD-10-CM

## 2019-02-26 DIAGNOSIS — N183 Chronic kidney disease, stage 3 unspecified: Secondary | ICD-10-CM | POA: Diagnosis present

## 2019-02-26 DIAGNOSIS — I130 Hypertensive heart and chronic kidney disease with heart failure and stage 1 through stage 4 chronic kidney disease, or unspecified chronic kidney disease: Secondary | ICD-10-CM | POA: Diagnosis present

## 2019-02-26 DIAGNOSIS — Z885 Allergy status to narcotic agent status: Secondary | ICD-10-CM

## 2019-02-26 DIAGNOSIS — E1122 Type 2 diabetes mellitus with diabetic chronic kidney disease: Secondary | ICD-10-CM | POA: Diagnosis present

## 2019-02-26 DIAGNOSIS — H409 Unspecified glaucoma: Secondary | ICD-10-CM | POA: Diagnosis present

## 2019-02-26 DIAGNOSIS — J449 Chronic obstructive pulmonary disease, unspecified: Secondary | ICD-10-CM | POA: Diagnosis present

## 2019-02-26 DIAGNOSIS — S81812A Laceration without foreign body, left lower leg, initial encounter: Secondary | ICD-10-CM | POA: Diagnosis present

## 2019-02-26 DIAGNOSIS — I5032 Chronic diastolic (congestive) heart failure: Secondary | ICD-10-CM | POA: Diagnosis present

## 2019-02-26 DIAGNOSIS — Z888 Allergy status to other drugs, medicaments and biological substances status: Secondary | ICD-10-CM

## 2019-02-26 DIAGNOSIS — N1831 Chronic kidney disease, stage 3a: Secondary | ICD-10-CM | POA: Diagnosis not present

## 2019-02-26 DIAGNOSIS — E785 Hyperlipidemia, unspecified: Secondary | ICD-10-CM | POA: Diagnosis present

## 2019-02-26 DIAGNOSIS — Z20828 Contact with and (suspected) exposure to other viral communicable diseases: Secondary | ICD-10-CM | POA: Diagnosis present

## 2019-02-26 HISTORY — DX: Cutaneous abscess of left lower limb: L02.416

## 2019-02-26 HISTORY — DX: Cutaneous abscess of left lower limb: L03.116

## 2019-02-26 LAB — CBC WITH DIFFERENTIAL/PLATELET
Abs Immature Granulocytes: 0.02 10*3/uL (ref 0.00–0.07)
Basophils Absolute: 0.1 10*3/uL (ref 0.0–0.1)
Basophils Relative: 1 %
Eosinophils Absolute: 0.2 10*3/uL (ref 0.0–0.5)
Eosinophils Relative: 2 %
HCT: 36.2 % (ref 36.0–46.0)
Hemoglobin: 11.7 g/dL — ABNORMAL LOW (ref 12.0–15.0)
Immature Granulocytes: 0 %
Lymphocytes Relative: 14 %
Lymphs Abs: 1.5 10*3/uL (ref 0.7–4.0)
MCH: 29.8 pg (ref 26.0–34.0)
MCHC: 32.3 g/dL (ref 30.0–36.0)
MCV: 92.3 fL (ref 80.0–100.0)
Monocytes Absolute: 0.7 10*3/uL (ref 0.1–1.0)
Monocytes Relative: 7 %
Neutro Abs: 8.3 10*3/uL — ABNORMAL HIGH (ref 1.7–7.7)
Neutrophils Relative %: 76 %
Platelets: UNDETERMINED 10*3/uL (ref 150–400)
RBC: 3.92 MIL/uL (ref 3.87–5.11)
RDW: 13.6 % (ref 11.5–15.5)
WBC: 10.8 10*3/uL — ABNORMAL HIGH (ref 4.0–10.5)
nRBC: 0 % (ref 0.0–0.2)

## 2019-02-26 LAB — COMPREHENSIVE METABOLIC PANEL
ALT: 11 U/L (ref 0–44)
AST: 17 U/L (ref 15–41)
Albumin: 3.8 g/dL (ref 3.5–5.0)
Alkaline Phosphatase: 80 U/L (ref 38–126)
Anion gap: 10 (ref 5–15)
BUN: 21 mg/dL (ref 8–23)
CO2: 26 mmol/L (ref 22–32)
Calcium: 9.3 mg/dL (ref 8.9–10.3)
Chloride: 102 mmol/L (ref 98–111)
Creatinine, Ser: 1.31 mg/dL — ABNORMAL HIGH (ref 0.44–1.00)
GFR calc Af Amer: 43 mL/min — ABNORMAL LOW (ref >60)
GFR calc non Af Amer: 37 mL/min — ABNORMAL LOW (ref >60)
Glucose, Bld: 136 mg/dL — ABNORMAL HIGH (ref 70–99)
Potassium: 4.2 mmol/L (ref 3.5–5.1)
Sodium: 138 mmol/L (ref 135–145)
Total Bilirubin: 0.7 mg/dL (ref 0.3–1.2)
Total Protein: 6.2 g/dL — ABNORMAL LOW (ref 6.5–8.1)

## 2019-02-26 LAB — SEDIMENTATION RATE: Sed Rate: 38 mm/hr — ABNORMAL HIGH (ref 0–22)

## 2019-02-26 MED ORDER — ISOSORBIDE MONONITRATE ER 60 MG PO TB24
60.0000 mg | ORAL_TABLET | Freq: Every day | ORAL | Status: DC
  Administered 2019-02-27 – 2019-02-28 (×2): 60 mg via ORAL
  Filled 2019-02-26 (×2): qty 1

## 2019-02-26 MED ORDER — ENOXAPARIN SODIUM 40 MG/0.4ML ~~LOC~~ SOLN: 40.0000 mg | SUBCUTANEOUS | Status: DC

## 2019-02-26 MED ORDER — VANCOMYCIN HCL 10 G IV SOLR
1250.0000 mg | INTRAVENOUS | Status: DC
  Filled 2019-02-26: qty 1250

## 2019-02-26 MED ORDER — SODIUM CHLORIDE 0.9% FLUSH: 3.0000 mL | Freq: Two times a day (BID) | INTRAVENOUS | Status: DC

## 2019-02-26 MED ORDER — SODIUM CHLORIDE 0.9 % IV SOLN: 250.0000 mL | INTRAVENOUS | Status: DC | PRN

## 2019-02-26 MED ORDER — IRBESARTAN 300 MG PO TABS
300.0000 mg | ORAL_TABLET | Freq: Every day | ORAL | Status: DC
  Administered 2019-02-27: 300 mg via ORAL
  Filled 2019-02-26: qty 1

## 2019-02-26 MED ORDER — BRIMONIDINE TARTRATE 0.2 % OP SOLN
1.0000 [drp] | Freq: Every day | OPHTHALMIC | Status: DC
  Filled 2019-02-26: qty 5

## 2019-02-26 MED ORDER — METFORMIN HCL 500 MG PO TABS
500.0000 mg | ORAL_TABLET | Freq: Every day | ORAL | Status: DC
  Administered 2019-02-27 – 2019-02-28 (×2): 500 mg via ORAL
  Filled 2019-02-26 (×2): qty 1

## 2019-02-26 MED ORDER — LATANOPROST 0.005 % OP SOLN
1.0000 [drp] | Freq: Every day | OPHTHALMIC | Status: DC
  Administered 2019-02-26 – 2019-02-27 (×2): 1 [drp] via OPHTHALMIC
  Filled 2019-02-26: qty 2.5

## 2019-02-26 MED ORDER — TRAMADOL HCL 50 MG PO TABS
50.0000 mg | ORAL_TABLET | Freq: Four times a day (QID) | ORAL | Status: DC | PRN
  Administered 2019-02-26: 19:00:00 50 mg via ORAL
  Filled 2019-02-26: qty 1

## 2019-02-26 MED ORDER — VANCOMYCIN HCL 10 G IV SOLR
2000.0000 mg | Freq: Once | INTRAVENOUS | Status: AC
  Administered 2019-02-26: 2000 mg via INTRAVENOUS
  Filled 2019-02-26: qty 2000

## 2019-02-26 MED ORDER — ENOXAPARIN SODIUM 40 MG/0.4ML ~~LOC~~ SOLN
40.0000 mg | SUBCUTANEOUS | Status: DC
  Administered 2019-02-26 – 2019-02-27 (×2): 40 mg via SUBCUTANEOUS
  Filled 2019-02-26 (×3): qty 0.4

## 2019-02-26 MED ORDER — ONDANSETRON HCL 4 MG/2ML IJ SOLN: 4.0000 mg | Freq: Four times a day (QID) | INTRAMUSCULAR | Status: DC | PRN

## 2019-02-26 MED ORDER — OXYCODONE HCL 5 MG PO TABS
5.0000 mg | ORAL_TABLET | Freq: Four times a day (QID) | ORAL | Status: DC | PRN
  Filled 2019-02-26: qty 1

## 2019-02-26 MED ORDER — SODIUM CHLORIDE 0.9% FLUSH
3.0000 mL | Freq: Two times a day (BID) | INTRAVENOUS | Status: DC
  Administered 2019-02-26 – 2019-02-28 (×2): 3 mL via INTRAVENOUS

## 2019-02-26 MED ORDER — INSULIN ASPART 100 UNIT/ML ~~LOC~~ SOLN: 0.0000 [IU] | Freq: Three times a day (TID) | SUBCUTANEOUS | Status: DC

## 2019-02-26 MED ORDER — ACETAMINOPHEN 325 MG RE SUPP: 650.0000 mg | Freq: Four times a day (QID) | RECTAL | Status: DC | PRN

## 2019-02-26 MED ORDER — ACETAMINOPHEN 325 MG PO TABS: 650.0000 mg | ORAL_TABLET | Freq: Four times a day (QID) | ORAL | Status: DC | PRN

## 2019-02-26 MED ORDER — SODIUM CHLORIDE 0.9 % IV SOLN
3.0000 g | Freq: Three times a day (TID) | INTRAVENOUS | Status: DC
  Administered 2019-02-27: 3 g via INTRAVENOUS
  Filled 2019-02-26: qty 8
  Filled 2019-02-26: qty 3
  Filled 2019-02-26: qty 8

## 2019-02-26 MED ORDER — ALBUTEROL SULFATE (2.5 MG/3ML) 0.083% IN NEBU: 2.5000 mg | INHALATION_SOLUTION | Freq: Four times a day (QID) | RESPIRATORY_TRACT | Status: DC | PRN

## 2019-02-26 MED ORDER — INSULIN ASPART 100 UNIT/ML ~~LOC~~ SOLN: 0.0000 [IU] | Freq: Every day | SUBCUTANEOUS | Status: DC

## 2019-02-26 MED ORDER — ACETAMINOPHEN 325 MG PO TABS
650.0000 mg | ORAL_TABLET | Freq: Four times a day (QID) | ORAL | Status: DC | PRN
  Administered 2019-02-26 – 2019-02-27 (×2): 650 mg via ORAL
  Filled 2019-02-26 (×2): qty 2

## 2019-02-26 MED ORDER — SODIUM CHLORIDE 0.9% FLUSH: 3.0000 mL | INTRAVENOUS | Status: DC | PRN

## 2019-02-26 MED ORDER — PIOGLITAZONE HCL 30 MG PO TABS
30.0000 mg | ORAL_TABLET | Freq: Every day | ORAL | Status: DC
  Administered 2019-02-27 – 2019-02-28 (×2): 30 mg via ORAL
  Filled 2019-02-26 (×2): qty 1

## 2019-02-26 MED ORDER — ALBUTEROL SULFATE HFA 108 (90 BASE) MCG/ACT IN AERS: 2.0000 | INHALATION_SPRAY | Freq: Four times a day (QID) | RESPIRATORY_TRACT | Status: DC | PRN

## 2019-02-26 MED ORDER — LIRAGLUTIDE 18 MG/3ML ~~LOC~~ SOPN
0.6000 mg | PEN_INJECTOR | Freq: Every day | SUBCUTANEOUS | Status: DC
  Administered 2019-02-27: 14:00:00 0.6 mg via SUBCUTANEOUS
  Filled 2019-02-26: qty 3

## 2019-02-26 MED ORDER — ASPIRIN EC 81 MG PO TBEC
81.0000 mg | DELAYED_RELEASE_TABLET | Freq: Every day | ORAL | Status: DC
  Administered 2019-02-27 – 2019-02-28 (×2): 81 mg via ORAL
  Filled 2019-02-26 (×2): qty 1

## 2019-02-26 MED ORDER — BRINZOLAMIDE 1 % OP SUSP
1.0000 [drp] | Freq: Every day | OPHTHALMIC | Status: DC
  Filled 2019-02-26 (×2): qty 10

## 2019-02-26 MED ORDER — ACETAMINOPHEN 650 MG RE SUPP: 650.0000 mg | Freq: Four times a day (QID) | RECTAL | Status: DC | PRN

## 2019-02-26 MED ORDER — SODIUM CHLORIDE 0.9 % IV SOLN
3.0000 g | Freq: Once | INTRAVENOUS | Status: AC
  Administered 2019-02-26: 3 g via INTRAVENOUS
  Filled 2019-02-26: qty 3

## 2019-02-26 NOTE — Progress Notes (Addendum)
Pharmacy Antibiotic Note  [name] is a 85 y.o. female admitted on 02/26/2019 with cellulitis.  Pharmacy has been consulted for unasyn and vancomycin dosing.  Presenting with redness of R leg despite treatment with doxycyline and augmentin. WBC 10.8, afebrile. Scr 1.31 (CrCl 35 mL/min).   Has allergy listed to cephalosporins- has tolerated zosyn and augmentin in the past.  Plan: Vancomycin 2 g IV once then 1250 mg IV every 48 hours (estAUC 473)  Unasyn 3g IV every 8 hours Monitor renal fx, cx results, clinical pic, and vanc level as indicated  Weight: 219 lb 9.6 oz (99.6 kg)  Temp (24hrs), Avg:98.2 F (36.8 C), Min:98.2 F (36.8 C), Max:98.2 F (36.8 C)  Recent Labs  Lab 02/26/19 2024  WBC 10.8*  CREATININE 1.31*    Estimated Creatinine Clearance: 35.4 mL/min (A) (by C-G formula based on SCr of 1.31 mg/dL (H)).    Allergies  Allergen Reactions  . Cephalexin Anaphylaxis and Swelling  . Invokana [Canagliflozin]     Pt does not recall  . Lipitor [Atorvastatin Calcium] Other (See Comments)    myalgia  . Tramadol Nausea Only    Antimicrobials this admission: Vancomycin 11/1 >>  Unasyn 11/1 >>   Dose adjustments this admission: N/A  Microbiology results: 11/1 BCx: sent 11/1 Wound: sent  11/1 COVID: sent   Thank you for allowing pharmacy to be a part of this patient's care.  Antonietta Jewel, PharmD, BCCCP Clinical Pharmacist  Phone: [phone]  Please check AMION for all [place] phone numbers After 10:00 PM, call [place] [phone] 02/26/2019 9:16 PM

## 2019-02-26 NOTE — H&P (Addendum)
H&P    Patient Demographics:    [name], is a 85 y.o. female  MRN: [id number]  DOB - 1930/05/14  Admit Date - 02/26/2019  Referring MD/NP/PA:  [name]   Outpatient Primary MD for the patient is [name], MD  Patient coming from:  home  Chief complaint- cellulitis   HPI:    [name]  is a 85 y.o. female, w hypertension, hyperlipidemia, Dm2, ckd stage3, pulmonary hypertension, apparently presents with c/o redness of the right leg.  Pt was seen in ED x2, and tx with oral abx, and the redness has persisted.  Pt called pcp to state that oral abx was not working and sent for direct admission.     Review of systems:    In addition to the HPI above,  No Fever-chills, No Headache, No changes with Vision or hearing, No problems swallowing food or Liquids, No Chest pain, Cough or Shortness of Breath, No Abdominal pain, No Nausea or Vomiting, bowel movements are regular, No Blood in stool or Urine, No dysuria,  No new joints pains-aches,  No new weakness, tingling, numbness in any extremity, No recent weight gain or loss, No polyuria, polydypsia or polyphagia, No significant Mental Stressors.  All other systems reviewed and are negative.    Past History of the following :    Past Medical History:  Diagnosis Date  . CHF (congestive heart failure) ([place])   . COPD (chronic obstructive pulmonary disease) ([place])   . Diabetes mellitus   . Glaucoma   . Hyperlipidemia   . Hypertension       Past Surgical History:  Procedure Laterality Date  . BACK SURGERY    . CARDIAC CATHETERIZATION N/A 12/09/2015   Procedure: Right Heart Cath;  Surgeon: Adrian Prows, MD;  Location: [facility];  Service: Cardiovascular;  Laterality: N/A;  . CHOLECYSTECTOMY  2000  . HERNIA REPAIR  2007  . rupture disk  1970"s  . SHOULDER ARTHROSCOPY WITH ROTATOR CUFF REPAIR  1999   rt shoulder      Social  History:      Social History   Tobacco Use  . Smoking status: Never Smoker  . Smokeless tobacco: Never Used  Substance Use Topics  . Alcohol use: No       Family History :     Family History  Problem Relation Age of Onset  . Stroke Mother        Home Medications:   Prior to Admission medications   Medication Sig Start Date End Date Taking? Authorizing Provider  acetaminophen (TYLENOL) 650 MG CR tablet Take 650 mg by mouth every 8 (eight) hours as needed for pain.   Yes [provider]  albuterol (PROVENTIL HFA;VENTOLIN HFA) 108 (90 Base) MCG/ACT inhaler Inhale 2 puffs into the lungs every 6 (six) hours as needed for wheezing or shortness of breath. 11/06/17  Yes Valere Dross, Alyssa B, PA-C  amoxicillin-clavulanate (AUGMENTIN) 875-125 MG tablet Take 12 tablets by mouth every 12 (twelve) hours. 02/23/19  Yes [provider]  aspirin EC 81  MG tablet Take 81 mg by mouth daily.   Yes [provider]  Brinzolamide-Brimonidine (SIMBRINZA) 1-0.2 % SUSP Apply 1 drop to eye daily after supper.   Yes [provider]  doxycycline (VIBRAMYCIN) 100 MG capsule Take 1 capsule (100 mg total) by mouth 2 (two) times daily. 02/21/19  Yes Sharion Balloon, NP  isosorbide mononitrate (IMDUR) 60 MG 24 hr tablet TAKE 1 TABLET DAILY Patient taking differently: Take 60 mg by mouth daily.  10/03/18  Yes Adrian Prows, MD  Liraglutide (VICTOZA [place]) Inject 0.6 mg into the skin daily.    Yes [provider]  LUMIGAN 0.01 % SOLN Place 1 drop into both eyes at bedtime.  12/28/16  Yes [provider]  metFORMIN (GLUCOPHAGE) 1000 MG tablet Take 0.5 tablets (500 mg total) by mouth daily. 10/27/16  Yes [name], MD  pioglitazone (ACTOS) 30 MG tablet Take 30 mg by mouth daily.    Yes [provider]  telmisartan (MICARDIS) 80 MG tablet Take 80 mg by mouth daily.   Yes [provider]     Allergies:     Allergies  Allergen Reactions  . Cephalexin  Anaphylaxis and Swelling  . Invokana [Canagliflozin]     Pt does not recall  . Lipitor [Atorvastatin Calcium] Other (See Comments)    myalgia  . Tramadol Nausea Only     Physical Exam:   Vitals  Blood pressure (!) 164/74, pulse 83, temperature 98.2 F (36.8 C), temperature source Oral, resp. rate 20, weight 99.6 kg, SpO2 97 %.  1.  General: aoxo3  2. Psychiatric: euthymic  3. Neurologic: Nonfocal,  cn2-12 intact, reflexes 2+ symmetric, diffuse with no clonus, motor 5/5 in all 4 ext  4. HEENMT:  Anicteric, pupils 1.26m symmetric, direct, consensual, near intact Neck: no jvd  5. Respiratory : CTAB  6. Cardiovascular : rrr s1, s2,   7. Gastrointestinal:  Abd: soft, nt, nd, +bs  8. Skin:  Ext: no c/c/e,  Redness of the left distal lower ext from ankle to knee,  There is evidence of prior laceration in the mid distal lower ext from trauma secondary to wood board.    9.Musculoskeletal:  Good ROM     Data Review:    CBC No results for input(s): WBC, HGB, HCT, PLT, MCV, MCH, MCHC, RDW, LYMPHSABS, MONOABS, EOSABS, BASOSABS, BANDABS in the last 168 hours.  Invalid input(s): NEUTRABS, BANDSABD ------------------------------------------------------------------------------------------------------------------  No results found for this or any previous visit (from the past 435hour(s)).  Chemistries  No results for input(s): NA, K, CL, CO2, GLUCOSE, BUN, CREATININE, CALCIUM, MG, AST, ALT, ALKPHOS, BILITOT in the last 168 hours.  Invalid input(s): GFRCGP ------------------------------------------------------------------------------------------------------------------  ------------------------------------------------------------------------------------------------------------------ GFR: CrCl cannot be calculated (Patient's most recent lab result is older than the maximum 21 days allowed.). Liver Function Tests: No results for input(s): AST, ALT, ALKPHOS, BILITOT, PROT,  ALBUMIN in the last 168 hours. No results for input(s): LIPASE, AMYLASE in the last 168 hours. No results for input(s): AMMONIA in the last 168 hours. Coagulation Profile: No results for input(s): INR, PROTIME in the last 168 hours. Cardiac Enzymes: No results for input(s): CKTOTAL, CKMB, CKMBINDEX, TROPONINI in the last 168 hours. BNP (last 3 results) No results for input(s): PROBNP in the last 8760 hours. HbA1C: No results for input(s): HGBA1C in the last 72 hours. CBG: No results for input(s): GLUCAP in the last 168 hours. Lipid Profile: No results for input(s): CHOL, HDL, LDLCALC, TRIG, CHOLHDL, LDLDIRECT in the last 72  hours. Thyroid Function Tests: No results for input(s): TSH, T4TOTAL, FREET4, T3FREE, THYROIDAB in the last 72 hours. Anemia Panel: No results for input(s): VITAMINB12, FOLATE, FERRITIN, TIBC, IRON, RETICCTPCT in the last 72 hours.  --------------------------------------------------------------------------------------------------------------- Urine analysis:    Component Value Date/Time   COLORURINE YELLOW 11/03/2017 0004   APPEARANCEUR CLEAR 11/03/2017 0004   LABSPEC 1.018 11/03/2017 0004   PHURINE 5.0 11/03/2017 0004   GLUCOSEU NEGATIVE 11/03/2017 0004   HGBUR NEGATIVE 11/03/2017 0004   BILIRUBINUR NEGATIVE 11/03/2017 0004   KETONESUR NEGATIVE 11/03/2017 0004   PROTEINUR NEGATIVE 11/03/2017 0004   UROBILINOGEN 0.2 12/05/2010 1740   NITRITE NEGATIVE 11/03/2017 0004   LEUKOCYTESUR SMALL (A) 11/03/2017 0004      Imaging Results:    No results found.     Assessment & Plan:    Principal Problem:   Cellulitis Active Problems:   Diabetes mellitus (HCC)   CKD (chronic kidney disease) stage 3, GFR 30-59 ml/min   Essential hypertension  Cellulitis Wound culture Blood culture x2 Check ESR Check cbc Tramadol 59m po q6h prn pain vanco iv pharmacy to dose unasyn iv pharmacy to dose  Dm2 fsbs ac and qhs, ISS  CKD stage 3 Check cmp    Hypertension Cont ARB  Glaucoma Cont eye drops   DVT Prophylaxis-   Lovenox - SCDs   AM Labs Ordered, also please review Full Orders  Family Communication: Admission, patients condition and plan of care including tests being ordered have been discussed with the patient who indicate understanding and agree with the plan and Code Status.  Code Status: FULL CODE per patient, husband with patient  Admission status: Inpatient: Based on patients clinical presentation and evaluation of above clinical data, I have made determination that patient meets Inpatient criteria at this time.  Pt has failed oral abx, pt will be admitted for iv abx.  Pt has high risk of clinical deterioration due to diabetes, and other comorbidities. Pt will require > 2 nites stay.   Time spent in minutes :55  minutes.   Call [phone] reach Dr. KAlexandria Lodgeto 7am  JJani GravelM.D on 02/26/2019 at 7:06 PM

## 2019-02-26 NOTE — ED Triage Notes (Signed)
Skin tear to L lower leg 9 days ago from wood that she was moving.  States she is taking antibiotic and pain has gotten worse.

## 2019-02-26 NOTE — Plan of Care (Signed)
Pt new direct admit with left lower leg cellulitis.

## 2019-02-26 NOTE — Progress Notes (Signed)
Pt new direct admit under Dr. [name], pt alert and oriented with facemask, room air, husband at bedside, with left lower leg cellulitis with dressing, able to ambulate, waiting for new orders.

## 2019-02-27 DIAGNOSIS — N1831 Chronic kidney disease, stage 3a: Secondary | ICD-10-CM

## 2019-02-27 DIAGNOSIS — L03116 Cellulitis of left lower limb: Principal | ICD-10-CM

## 2019-02-27 DIAGNOSIS — E1121 Type 2 diabetes mellitus with diabetic nephropathy: Secondary | ICD-10-CM

## 2019-02-27 DIAGNOSIS — N183 Chronic kidney disease, stage 3 unspecified: Secondary | ICD-10-CM

## 2019-02-27 LAB — BASIC METABOLIC PANEL
Anion gap: 8 (ref 5–15)
BUN: 21 mg/dL (ref 8–23)
CO2: 26 mmol/L (ref 22–32)
Calcium: 8.6 mg/dL — ABNORMAL LOW (ref 8.9–10.3)
Chloride: 103 mmol/L (ref 98–111)
Creatinine, Ser: 1.18 mg/dL — ABNORMAL HIGH (ref 0.44–1.00)
GFR calc Af Amer: 48 mL/min — ABNORMAL LOW (ref >60)
GFR calc non Af Amer: 42 mL/min — ABNORMAL LOW (ref >60)
Glucose, Bld: 109 mg/dL — ABNORMAL HIGH (ref 70–99)
Potassium: 4.9 mmol/L (ref 3.5–5.1)
Sodium: 137 mmol/L (ref 135–145)

## 2019-02-27 LAB — GLUCOSE, CAPILLARY
Glucose-Capillary: 101 mg/dL — ABNORMAL HIGH (ref 70–99)
Glucose-Capillary: 167 mg/dL — ABNORMAL HIGH (ref 70–99)
Glucose-Capillary: 112 mg/dL — ABNORMAL HIGH (ref 70–99)
Glucose-Capillary: 103 mg/dL — ABNORMAL HIGH (ref 70–99)

## 2019-02-27 LAB — SARS CORONAVIRUS 2 (TAT 6-24 HRS): SARS Coronavirus 2: NEGATIVE

## 2019-02-27 MED ORDER — IRBESARTAN 150 MG PO TABS
150.0000 mg | ORAL_TABLET | Freq: Every day | ORAL | Status: DC
  Administered 2019-02-28: 10:00:00 150 mg via ORAL
  Filled 2019-02-27: qty 1

## 2019-02-27 MED ORDER — HYDRALAZINE HCL 20 MG/ML IJ SOLN: 5.0000 mg | Freq: Four times a day (QID) | INTRAMUSCULAR | Status: DC | PRN

## 2019-02-27 MED ORDER — COLLAGENASE 250 UNIT/GM EX OINT
TOPICAL_OINTMENT | Freq: Every day | CUTANEOUS | Status: DC
  Administered 2019-02-27 – 2019-02-28 (×2): via TOPICAL
  Filled 2019-02-27: qty 30

## 2019-02-27 NOTE — Consult Note (Signed)
[facility] Nurse wound consult note Reason for Consult: Pt bumped left leg at home approx a week ago and it appears to have developed a hematoma which has evolved into eschar. Wound type: Left anterior calf with full thickness wound Measurement: 4X5cm, 90% tightly adhered esdhar, 10% red and dry.  No odor or drainage Dressing procedure/placement/frequency: Pt could benefit from follow-up at the outpatient wound care center after discharge for possible sharp debridement after the eschar has softened;  please order if desired.  Topical treatment orders provided for bedside nurses to perform as follows: Apply Santyl to left leg Q day, then cover with moist gauze and foam dressing.  (Change foam dressing Q 3 days or PRN soiling.) Please re-consult if further assistance is needed.  Thank-you,  Julien Girt MSN, [place], [place], [place], [place]

## 2019-02-27 NOTE — Progress Notes (Signed)
Patient ID: [name], female   DOB: 01/22/1931, 85 y.o.   MRN: [id number]                                                                PROGRESS NOTE                                                                                                                                                                                                             Patient Demographics:    [name], is a 85 y.o. female, DOB - 1930/05/10, [id number]  Admit date - 02/26/2019   Admitting Physician [name], MD  Outpatient Primary MD for the patient is [name], MD  LOS - 1  Outpatient Specialists:   No chief complaint on file. CC: Cellulitis     Brief Narrative   85 y.o. female, w hypertension, hyperlipidemia, Dm2, ckd stage3, pulmonary hypertension, apparently presents with c/o redness of the right leg.  Pt was seen in ED x2, and tx with oral abx, and the redness has persisted.  Pt called pcp to state that oral abx was not working and sent for direct admission.    Subjective:    [name] today has been afebrile.  Pt states that she is tolerating abx ok.  Redness of the left distal lower ext still present.    No headache, No chest pain, No abdominal pain - No Nausea, No new weakness tingling or numbness, No Cough - SOB.    Assessment  & Plan :    Principal Problem:   Cellulitis Active Problems:   Diabetes mellitus (HCC)   CKD (chronic kidney disease) stage 3, GFR 30-59 ml/min   Essential hypertension  Cellulitis Wound culture Blood culture x2 Check cbc in am vanco iv pharmacy to dose unasyn iv pharmacy to dose  Dm2 Cont Metformin  Cont Actos Cont Victoza Fsbs ac and qhs, ISS  CKD stage 3 Check cmp in am  Hypertension Cont ARB  Glaucoma Cont eye drops  Code Status :  FULL CODE  Family Communication  :  w patient  Disposition Plan  :  home  Barriers For Discharge :   Consults  :   none  Procedures  : none   DVT Prophylaxis  :  Lovenox -  SCDs  Lab Results  Component Value Date   PLT PLATELET CLUMPS NOTED ON SMEAR, UNABLE TO ESTIMATE 02/26/2019    Antibiotics  :  Vanco, unasyn iv 02/26/2019->   Anti-infectives (From admission, onward)   Start     Dose/Rate Route Frequency Ordered Stop   02/28/19 2200  vancomycin (VANCOCIN) 1,250 mg in sodium chloride 0.9 % 250 mL IVPB     1,250 mg 166.7 mL/hr over 90 Minutes Intravenous Every 48 hours 02/26/19 2120     02/27/19 0600  Ampicillin-Sulbactam (UNASYN) 3 g in sodium chloride 0.9 % 100 mL IVPB     3 g 200 mL/hr over 30 Minutes Intravenous Every 8 hours 02/26/19 2120     02/26/19 2030  vancomycin (VANCOCIN) 2,000 mg in sodium chloride 0.9 % 500 mL IVPB     2,000 mg 250 mL/hr over 120 Minutes Intravenous  Once 02/26/19 2027 02/26/19 2333   02/26/19 2030  Ampicillin-Sulbactam (UNASYN) 3 g in sodium chloride 0.9 % 100 mL IVPB     3 g 200 mL/hr over 30 Minutes Intravenous  Once 02/26/19 2027 02/26/19 2154        Objective:   Vitals:   02/26/19 1739 02/26/19 1743 02/26/19 2122  BP:  (!) 164/74 (!) 139/56  Pulse:  83 92  Resp:  20 18  Temp:  98.2 F (36.8 C) 97.9 F (36.6 C)  TempSrc:  Oral Oral  SpO2:  97% 95%  Weight: 99.6 kg      Wt Readings from Last 3 Encounters:  02/26/19 99.6 kg  02/21/19 129.3 kg  11/11/18 102.1 kg    No intake or output data in the 24 hours ending 02/27/19 0545   Physical Exam  Awake Alert, Oriented X 3, No new F.N deficits, Normal affect [place].AT,PERRAL Supple Neck,No JVD, No cervical lymphadenopathy appriciated.  Symmetrical Chest wall movement, Good air movement bilaterally, CTAB RRR,No Gallops,Rubs or new Murmurs, No Parasternal Heave +ve B.Sounds, Abd Soft, No tenderness, No organomegaly appriciated, No rebound - guarding or rigidity. No Cyanosis, Clubbing or edema,  Redness of the left distal lower ext from ankle to knee,  There is evidence of prior laceration in the mid distal lower ext from trauma secondary to wood board.     Data Review:    CBC Recent Labs  Lab 02/26/19 2024  WBC 10.8*  HGB 11.7*  HCT 36.2  PLT PLATELET CLUMPS NOTED ON SMEAR, UNABLE TO ESTIMATE  MCV 92.3  MCH 29.8  MCHC 32.3  RDW 13.6  LYMPHSABS 1.5  MONOABS 0.7  EOSABS 0.2  BASOSABS 0.1    Chemistries  Recent Labs  Lab 02/26/19 2024  NA 138  K 4.2  CL 102  CO2 26  GLUCOSE 136*  BUN 21  CREATININE 1.31*  CALCIUM 9.3  AST 17  ALT 11  ALKPHOS 80  BILITOT 0.7   ------------------------------------------------------------------------------------------------------------------ No results for input(s): CHOL, HDL, LDLCALC, TRIG, CHOLHDL, LDLDIRECT in the last 72 hours.  Lab Results  Component Value Date   HGBA1C 6.7 (H) 10/24/2016   ------------------------------------------------------------------------------------------------------------------ No results for input(s): TSH, T4TOTAL, T3FREE, THYROIDAB in the last 72 hours.  Invalid input(s): FREET3 ------------------------------------------------------------------------------------------------------------------ No results for input(s): VITAMINB12, FOLATE, FERRITIN, TIBC, IRON, RETICCTPCT in the last 72 hours.  Coagulation profile No results for input(s): INR, PROTIME in the last 168 hours.  No results for input(s): DDIMER in the last 72 hours.  Cardiac Enzymes No results for input(s): CKMB, TROPONINI, MYOGLOBIN in the last 168 hours.  Invalid input(s): CK ------------------------------------------------------------------------------------------------------------------  Component Value Date/Time   BNP 452.4 (H) 11/05/2017 1950    Inpatient Medications  Scheduled Meds: . aspirin EC  81 mg Oral Daily  . brimonidine  1 drop Both Eyes QHS  . brinzolamide  1 drop Both Eyes QHS  . enoxaparin (LOVENOX) injection  40 mg Subcutaneous Q24H  . insulin aspart  0-5 Units Subcutaneous QHS  . insulin aspart  0-9 Units Subcutaneous TID WC  . irbesartan  300 mg Oral  Daily  . isosorbide mononitrate  60 mg Oral Daily  . latanoprost  1 drop Both Eyes QHS  . liraglutide  0.6 mg Subcutaneous Daily  . metFORMIN  500 mg Oral Daily  . pioglitazone  30 mg Oral Daily  . sodium chloride flush  3 mL Intravenous Q12H   Continuous Infusions: . sodium chloride    . ampicillin-sulbactam (UNASYN) IV    . [START ON 02/28/2019] vancomycin     PRN Meds:.sodium chloride, acetaminophen **OR** acetaminophen, albuterol, ondansetron (ZOFRAN) IV, oxyCODONE, sodium chloride flush  Micro Results Recent Results (from the past 240 hour(s))  SARS CORONAVIRUS 2 (TAT 6-24 HRS) Nasopharyngeal Nasopharyngeal Swab     Status: None   Collection Time: 02/26/19  5:41 PM   Specimen: Nasopharyngeal Swab  Result Value Ref Range Status   SARS Coronavirus 2 NEGATIVE NEGATIVE Final    Comment: (NOTE) SARS-CoV-2 target nucleic acids are NOT DETECTED. The SARS-CoV-2 RNA is generally detectable in upper and lower respiratory specimens during the acute phase of infection. Negative results do not preclude SARS-CoV-2 infection, do not rule out co-infections with other pathogens, and should not be used as the sole basis for treatment or other patient management decisions. Negative results must be combined with clinical observations, patient history, and epidemiological information. The expected result is Negative. Fact Sheet for Patients: SugarRoll.be Fact Sheet for Healthcare Providers: https://www.woods-mathews.com/ This test is not yet approved or cleared by the [place] FDA and  has been authorized for detection and/or diagnosis of SARS-CoV-2 by FDA under an Emergency Use Authorization (EUA). This EUA will remain  in effect (meaning this test can be used) for the duration of the COVID-19 declaration under Section 56 4(b)(1) of the Act, 21 U.S.C. section 360bbb-3(b)(1), unless the authorization is terminated or revoked sooner. Performed at  [facility], [place] [address], [place], [place] [address]     Radiology Reports No results found.  Time Spent in minutes  30   [name] M.D on 02/27/2019 at 5:45 AM  Between 7am to 7am - Pager - [phone]

## 2019-02-27 NOTE — Progress Notes (Signed)
PROGRESS NOTE    [name]  [id number] DOB: 06/04/1930 DOA: 02/26/2019 PCP: [name], MD  Brief Narrative:[name]  is a 85 y.o. female  aortic stenosis, Chronic diastolic CHF, hypertension, diabetes mellitus, morbid obesity, and sleep apnea  Was seen in the emergency room on 10/27 after a low pile of log of would fell on her right leg, subsequently suffering an abrasion, skin tear to her left lower leg, she was discharged home on oral antibiotics for  Despite this continued to have worsening pain in her left lower leg around her wound, subsequently sent to the ED for admission   Assessment & Plan:    left lower extremity cellulitis,  Skin tear, open wound - despite oral doxycycline this past week - continue IV vancomycin -  Wound nurse consult - keep left leg elevated   type 2 diabetes mellitus - metformin and Actos continued, CBG are stable - follow-up hemoglobin A1c   stage 3 kidney disease - creatinine is stable, continue ARB   chronic diastolic CHF - clinically appears euvolemic has not required diuretics recently, was hospitalized in March for same, recommend to continue Lasix p.r.n. and strict salt restriction   mild aortic stenosis -  Follow-up with Dr.Ganji   morbid obesity, OSA - continue CPAP q.h.s., BMI is 37, recommended weight loss, lifestyle modification  DVT prophylaxis: Lovenox Code Status:  Full code Family Communication: no family at bedside, Disposition Plan:  Home tomorrow if stable   Procedures:   Antimicrobials:    Subjective: - feels better, left leg pain and swelling is starting to improve  Objective: Vitals:   02/26/19 1739 02/26/19 1743 02/26/19 2122 02/27/19 0622  BP:  (!) 164/74 (!) 139/56 (!) 138/51  Pulse:  83 92 74  Resp:  20 18 18   Temp:  98.2 F (36.8 C) 97.9 F (36.6 C) 97.9 F (36.6 C)  TempSrc:  Oral Oral   SpO2:  97% 95% 99%  Weight: 99.6 kg      No intake or output data in the 24 hours ending  02/27/19 1107 Filed Weights   02/26/19 1739  Weight: 99.6 kg    Examination:  General exam: Appears calm and comfortable  Respiratory system: Clear to auscultation. Respiratory effort normal. Cardiovascular system: S1 & S2 heard, RRR. Gastrointestinal system: Abdomen is nondistended, soft and nontender.Normal bowel sounds heard. Central nervous system: Alert and oriented. No focal neurological deficits. Extremities:  See below, no edema Skin:  Small quarter-sized skin tear with discoloration on her left lower leg, with surrounding erythema mild warmth and tenderness Psychiatry: Judgement and insight appear normal. Mood & affect appropriate.     Data Reviewed:   CBC: Recent Labs  Lab 02/26/19 2024  WBC 10.8*  NEUTROABS 8.3*  HGB 11.7*  HCT 36.2  MCV 92.3  PLT PLATELET CLUMPS NOTED ON SMEAR, UNABLE TO ESTIMATE   Basic Metabolic Panel: Recent Labs  Lab 02/26/19 2024 02/27/19 0716  NA 138 137  K 4.2 4.9  CL 102 103  CO2 26 26  GLUCOSE 136* 109*  BUN 21 21  CREATININE 1.31* 1.18*  CALCIUM 9.3 8.6*   GFR: Estimated Creatinine Clearance: 39.3 mL/min (A) (by C-G formula based on SCr of 1.18 mg/dL (H)). Liver Function Tests: Recent Labs  Lab 02/26/19 2024  AST 17  ALT 11  ALKPHOS 80  BILITOT 0.7  PROT 6.2*  ALBUMIN 3.8   No results for input(s): LIPASE, AMYLASE in the last 168 hours. No results for input(s):  AMMONIA in the last 168 hours. Coagulation Profile: No results for input(s): INR, PROTIME in the last 168 hours. Cardiac Enzymes: No results for input(s): CKTOTAL, CKMB, CKMBINDEX, TROPONINI in the last 168 hours. BNP (last 3 results) No results for input(s): PROBNP in the last 8760 hours. HbA1C: No results for input(s): HGBA1C in the last 72 hours. CBG: Recent Labs  Lab 02/26/19 1905 02/26/19 2119 02/27/19 0757  GLUCAP 101* 167* 112*   Lipid Profile: No results for input(s): CHOL, HDL, LDLCALC, TRIG, CHOLHDL, LDLDIRECT in the last 72 hours.  Thyroid Function Tests: No results for input(s): TSH, T4TOTAL, FREET4, T3FREE, THYROIDAB in the last 72 hours. Anemia Panel: No results for input(s): VITAMINB12, FOLATE, FERRITIN, TIBC, IRON, RETICCTPCT in the last 72 hours. Urine analysis:    Component Value Date/Time   COLORURINE YELLOW 11/03/2017 0004   APPEARANCEUR CLEAR 11/03/2017 0004   LABSPEC 1.018 11/03/2017 0004   PHURINE 5.0 11/03/2017 0004   GLUCOSEU NEGATIVE 11/03/2017 0004   HGBUR NEGATIVE 11/03/2017 0004   BILIRUBINUR NEGATIVE 11/03/2017 0004   KETONESUR NEGATIVE 11/03/2017 0004   PROTEINUR NEGATIVE 11/03/2017 0004   UROBILINOGEN 0.2 12/05/2010 1740   NITRITE NEGATIVE 11/03/2017 0004   LEUKOCYTESUR SMALL (A) 11/03/2017 0004   Sepsis Labs: @LABRCNTIP (procalcitonin:4,lacticidven:4)  ) Recent Results (from the past 240 hour(s))  SARS CORONAVIRUS 2 (TAT 6-24 HRS) Nasopharyngeal Nasopharyngeal Swab     Status: None   Collection Time: 02/26/19  5:41 PM   Specimen: Nasopharyngeal Swab  Result Value Ref Range Status   SARS Coronavirus 2 NEGATIVE NEGATIVE Final    Comment: (NOTE) SARS-CoV-2 target nucleic acids are NOT DETECTED. The SARS-CoV-2 RNA is generally detectable in upper and lower respiratory specimens during the acute phase of infection. Negative results do not preclude SARS-CoV-2 infection, do not rule out co-infections with other pathogens, and should not be used as the sole basis for treatment or other patient management decisions. Negative results must be combined with clinical observations, patient history, and epidemiological information. The expected result is Negative. Fact Sheet for Patients: SugarRoll.be Fact Sheet for Healthcare Providers: https://www.woods-mathews.com/ This test is not yet approved or cleared by the [place] FDA and  has been authorized for detection and/or diagnosis of SARS-CoV-2 by FDA under an Emergency Use Authorization (EUA).  This EUA will remain  in effect (meaning this test can be used) for the duration of the COVID-19 declaration under Section 56 4(b)(1) of the Act, 21 U.S.C. section 360bbb-3(b)(1), unless the authorization is terminated or revoked sooner. Performed at [facility], [place] [address], [place], [place] [address]   Aerobic Culture (superficial specimen)     Status: None (Preliminary result)   Collection Time: 02/26/19  7:00 PM   Specimen: Wound  Result Value Ref Range Status   Specimen Description WOUND LEFT LEG  Final   Special Requests NONE  Final   Gram Stain   Final    NO WBC SEEN NO ORGANISMS SEEN Performed at [facility], [place] [address], [place], [place] [address]    Culture PENDING  Incomplete   Report Status PENDING  Incomplete  Culture, blood (routine x 2)     Status: None (Preliminary result)   Collection Time: 02/26/19  8:24 PM   Specimen: BLOOD  Result Value Ref Range Status   Specimen Description BLOOD LEFT ANTECUBITAL  Final   Special Requests   Final    BOTTLES DRAWN AEROBIC ONLY Blood Culture results may not be optimal due to an inadequate volume  of blood received in culture bottles   Culture   Final    NO GROWTH < 12 HOURS Performed at [facility], [place] [address] [place], [place] [address]    Report Status PENDING  Incomplete  Culture, blood (routine x 2)     Status: None (Preliminary result)   Collection Time: 02/26/19  8:24 PM   Specimen: BLOOD LEFT HAND  Result Value Ref Range Status   Specimen Description BLOOD LEFT HAND  Final   Special Requests   Final    BOTTLES DRAWN AEROBIC ONLY Blood Culture adequate volume   Culture   Final    NO GROWTH < 12 HOURS Performed at [facility], [place] [address] [place], [place] [address]    Report Status PENDING  Incomplete         Radiology Studies: No results found.      Scheduled Meds: . aspirin EC  81 mg Oral Daily  . brimonidine  1 drop Both Eyes QHS  . brinzolamide  1 drop  Both Eyes QHS  . enoxaparin (LOVENOX) injection  40 mg Subcutaneous Q24H  . insulin aspart  0-5 Units Subcutaneous QHS  . insulin aspart  0-9 Units Subcutaneous TID WC  . irbesartan  300 mg Oral Daily  . isosorbide mononitrate  60 mg Oral Daily  . latanoprost  1 drop Both Eyes QHS  . liraglutide  0.6 mg Subcutaneous Daily  . metFORMIN  500 mg Oral Daily  . pioglitazone  30 mg Oral Daily  . sodium chloride flush  3 mL Intravenous Q12H   Continuous Infusions: . sodium chloride    . [START ON 02/28/2019] vancomycin       LOS: 1 day    Time spent: 44min    Domenic Polite, MD [facility]  02/27/2019, 11:07 AM

## 2019-02-28 ENCOUNTER — Encounter (HOSPITAL_COMMUNITY): Payer: Self-pay | Admitting: General Practice

## 2019-02-28 DIAGNOSIS — I10 Essential (primary) hypertension: Secondary | ICD-10-CM

## 2019-02-28 LAB — COMPREHENSIVE METABOLIC PANEL
ALT: 8 U/L (ref 0–44)
AST: 12 U/L — ABNORMAL LOW (ref 15–41)
Albumin: 3.1 g/dL — ABNORMAL LOW (ref 3.5–5.0)
Alkaline Phosphatase: 62 U/L (ref 38–126)
Anion gap: 12 (ref 5–15)
BUN: 23 mg/dL (ref 8–23)
CO2: 25 mmol/L (ref 22–32)
Calcium: 9 mg/dL (ref 8.9–10.3)
Chloride: 99 mmol/L (ref 98–111)
Creatinine, Ser: 1.1 mg/dL — ABNORMAL HIGH (ref 0.44–1.00)
GFR calc Af Amer: 53 mL/min — ABNORMAL LOW (ref >60)
GFR calc non Af Amer: 45 mL/min — ABNORMAL LOW (ref >60)
Glucose, Bld: 110 mg/dL — ABNORMAL HIGH (ref 70–99)
Potassium: 4.7 mmol/L (ref 3.5–5.1)
Sodium: 136 mmol/L (ref 135–145)
Total Bilirubin: 0.5 mg/dL (ref 0.3–1.2)
Total Protein: 5.5 g/dL — ABNORMAL LOW (ref 6.5–8.1)

## 2019-02-28 LAB — CBC
HCT: 33.3 % — ABNORMAL LOW (ref 36.0–46.0)
Hemoglobin: 10.2 g/dL — ABNORMAL LOW (ref 12.0–15.0)
MCH: 29.3 pg (ref 26.0–34.0)
MCHC: 30.6 g/dL (ref 30.0–36.0)
MCV: 95.7 fL (ref 80.0–100.0)
Platelets: UNDETERMINED 10*3/uL (ref 150–400)
RBC: 3.48 MIL/uL — ABNORMAL LOW (ref 3.87–5.11)
RDW: 13.8 % (ref 11.5–15.5)
WBC: 7.5 10*3/uL (ref 4.0–10.5)
nRBC: 0 % (ref 0.0–0.2)

## 2019-02-28 LAB — GLUCOSE, CAPILLARY
Glucose-Capillary: 93 mg/dL (ref 70–99)
Glucose-Capillary: 134 mg/dL — ABNORMAL HIGH (ref 70–99)
Glucose-Capillary: 118 mg/dL — ABNORMAL HIGH (ref 70–99)
Glucose-Capillary: 83 mg/dL (ref 70–99)

## 2019-02-28 MED ORDER — DOXYCYCLINE HYCLATE 100 MG PO CAPS: 100.0000 mg | ORAL_CAPSULE | Freq: Two times a day (BID) | ORAL | 0 refills | Status: AC

## 2019-02-28 MED ORDER — VANCOMYCIN HCL 10 G IV SOLR
1500.0000 mg | INTRAVENOUS | Status: DC
  Filled 2019-02-28: qty 1500

## 2019-02-28 MED ORDER — COLLAGENASE 250 UNIT/GM EX OINT: TOPICAL_OINTMENT | Freq: Every day | CUTANEOUS | 0 refills | Status: DC

## 2019-02-28 MED ORDER — AMOXICILLIN-POT CLAVULANATE 875-125 MG PO TABS: 1.0000 | ORAL_TABLET | Freq: Two times a day (BID) | ORAL | 0 refills | Status: AC

## 2019-02-28 NOTE — Evaluation (Signed)
Physical Therapy Evaluation Patient Details Name: [name] MRN: [id number] DOB: [birth year] Today's Date: 02/28/2019   History of Present Illness  Patient is an 85 year old female admitted with cellulitis of LE. PMH to include: HTN, DM, CKD, COPD, HLD and CHF.  Clinical Impression  Patient received sleeping in bed, easily roused. Very pleasant and agrees to PT assessment. Patient performed bed mobility independently, transfers with [facility] and supervision. She ambulated 40 feet in room with SPC. Reports pain in B LEs but does not demonstrate any significant difficulty with mobility. No LOB. Patient mobilizing independently in room. Patient does not require PT follow up at this time. PT signing off.     Follow Up Recommendations No PT follow up    Equipment Recommendations  None recommended by PT    Recommendations for Other Services       Precautions / Restrictions Precautions Precautions: Fall Precaution Comments: mod fall Restrictions Weight Bearing Restrictions: No      Mobility  Bed Mobility Overal bed mobility: Independent                Transfers Overall transfer level: Modified independent Equipment used: Straight cane                Ambulation/Gait Ambulation/Gait assistance: Supervision Gait Distance (Feet): 40 Feet Assistive device: Straight cane Gait Pattern/deviations: WFL(Within Functional Limits)     General Gait Details: patient ambulating independently in room with SPC. No LOB noted.  Stairs            Wheelchair Mobility    Modified Rankin (Stroke Patients Only)       Balance Overall balance assessment: Modified Independent                                           Pertinent Vitals/Pain Pain Assessment: Faces Faces Pain Scale: Hurts even more Pain Location: B LEs Pain Descriptors / Indicators: Sore;Aching Pain Intervention(s): Limited activity within patient's tolerance;Monitored during session     Home Living Family/patient expects to be discharged to:: Private residence Living Arrangements: Spouse/significant other Available Help at Discharge: Family Type of Home: House Home Access: Level entry     Home Layout: Multi-level;Able to live on main level with bedroom/bathroom Home Equipment: Kasandra Knudsen - single point      Prior Function Level of Independence: Independent with assistive device(s)               Hand Dominance        Extremity/Trunk Assessment   Upper Extremity Assessment Upper Extremity Assessment: Overall WFL for tasks assessed    Lower Extremity Assessment Lower Extremity Assessment: Overall WFL for tasks assessed    Cervical / Trunk Assessment Cervical / Trunk Assessment: Normal  Communication   Communication: No difficulties  Cognition Arousal/Alertness: Awake/alert Behavior During Therapy: WFL for tasks assessed/performed Overall Cognitive Status: Within Functional Limits for tasks assessed                                        General Comments      Exercises     Assessment/Plan    PT Assessment Patent does not need any further PT services  PT Problem List Pain       PT Treatment Interventions      PT Goals (  Current goals can be found in the Care Plan section)  Acute Rehab PT Goals Patient Stated Goal: to go back home, improve leg pain PT Goal Formulation: With patient Time For Goal Achievement: 03/07/19 Potential to Achieve Goals: Good    Frequency     Barriers to discharge        Co-evaluation               AM-PAC PT "6 Clicks" Mobility  Outcome Measure Help needed turning from your back to your side while in a flat bed without using bedrails?: None Help needed moving from lying on your back to sitting on the side of a flat bed without using bedrails?: None Help needed moving to and from a bed to a chair (including a wheelchair)?: None Help needed standing up from a chair using your arms  (e.g., wheelchair or bedside chair)?: None Help needed to walk in hospital room?: None Help needed climbing 3-5 steps with a railing? : None 6 Click Score: 24    End of Session Equipment Utilized During Treatment: Gait belt Activity Tolerance: Patient tolerated treatment well Patient left: in bed;with nursing/sitter in room;with call bell/phone within reach Nurse Communication: Mobility status PT Visit Diagnosis: Pain;Difficulty in walking, not elsewhere classified (R26.2) Pain - Right/Left: (Bilateral) Pain - part of body: Leg    Time: 1255-1305 PT Time Calculation (min) (ACUTE ONLY): 10 min   Charges:   PT Evaluation $PT Eval Moderate Complexity: 1 Mod          Gennavieve Huq, PT, GCS 02/28/19,1:13 PM

## 2019-02-28 NOTE — Discharge Summary (Signed)
Physician Discharge Summary  [name] [id number] DOB: [birth year] DOA: 02/26/2019  PCP: [name], MD  Admit date: 02/26/2019 Discharge date: 02/28/2019  Time spent: 35 minutes  Recommendations for Outpatient Follow-up:  PCP Dr. Maudie Mercury in 1 week [facility] on 11/10 at 1:15 PM   Discharge Diagnoses:  Left leg trauma with wound Mild cellulitis Morbid obesity Chronic diastolic CHF Mild aortic stenosis   Diabetes mellitus ([place])   CKD (chronic kidney disease) stage 3, GFR 30-59 ml/min   Essential hypertension   Discharge Condition: stable  Diet recommendation: Low-sodium, diabetic, heart healthy  Filed Weights   02/26/19 1739  Weight: 99.6 kg    History of present illness:  Teresa Colden Foustis a 85 y.o.femaleaortic stenosis, Chronic diastolic CHF, hypertension, diabetes mellitus, morbid obesity, and sleep apnea  Was seen in the emergency room on 10/27 after a low pile of log of would fell on her right leg, subsequently suffering an abrasion, skin tear to her left lower leg, she was discharged home on oral antibiotics for  Despite this continued to have worsening pain in her left lower leg around her wound, subsequently sent to the ED for admission    Hospital Course:   Left lower extremity wound with mild surrounding cellulitis  -Clinically improving with IV vancomycin and ceftriaxone, no fevers or leukocytosis -She has a full-thickness wound following trauma with log of wood falling on her leg, with a tightly adherent eschar which is causing some discomfort, seen by the wound nurse in consult who recommended outpatient wound center follow-up after discharge for possible sharp debridement after the eschar has softened. -We called the wound center and arranged hospital follow-up for 11/10 -In the meantime it was recommended to apply Santyl, covered with moist gauze and foam dressing, keep this wound clean, wash with soap and water -She will also continue  oral doxycycline and Augmentin for 5 more days, with wound clinic follow-up on 11/10   type 2 diabetes mellitus - metformin and Actos continued, CBG are stable   stage 3 kidney disease - creatinine is stable, continue ARB   chronic diastolic CHF - clinically appears euvolemic has not required diuretics recently, was hospitalized in March for same, recommend to continue Lasix p.r.n. and strict salt restriction   mild aortic stenosis -  Follow-up with Dr.Ganji   morbid obesity, OSA - continue CPAP q.h.s., BMI is 37, recommended weight loss, lifestyle modification   Consultations:  Wound RN  Discharge Exam: Vitals:   02/28/19 0800 02/28/19 0949  BP: (!) 131/51 (!) 141/56  Pulse: 74   Resp: 14   Temp: 98.1 F (36.7 C)   SpO2: 95%     General: AAOx3 Cardiovascular: S1S2/RRR Respiratory: CTAB  Discharge Instructions   Discharge Instructions    Diet - low sodium heart healthy   Complete by: As directed    Increase activity slowly   Complete by: As directed      Allergies as of 02/28/2019      Reactions   Cephalexin Anaphylaxis, Swelling   Invokana [canagliflozin]    Pt does not recall   Lipitor [atorvastatin Calcium] Other (See Comments)   myalgia   Tramadol Nausea Only      Medication List    TAKE these medications   acetaminophen 650 MG CR tablet Commonly known as: TYLENOL Take 650 mg by mouth every 8 (eight) hours as needed for pain.   Actos 30 MG tablet Generic drug: pioglitazone Take 30 mg by mouth daily.  albuterol 108 (90 Base) MCG/ACT inhaler Commonly known as: VENTOLIN HFA Inhale 2 puffs into the lungs every 6 (six) hours as needed for wheezing or shortness of breath.   amoxicillin-clavulanate 875-125 MG tablet Commonly known as: AUGMENTIN Take 1 tablet by mouth every 12 (twelve) hours for 6 days. What changed: how much to take   aspirin EC 81 MG tablet Take 81 mg by mouth daily.   collagenase ointment Commonly known as:  SANTYL Apply topically daily.   doxycycline 100 MG capsule Commonly known as: VIBRAMYCIN Take 1 capsule (100 mg total) by mouth 2 (two) times daily for 5 days.   isosorbide mononitrate 60 MG 24 hr tablet Commonly known as: IMDUR TAKE 1 TABLET DAILY   Lumigan 0.01 % Soln Generic drug: bimatoprost Place 1 drop into both eyes at bedtime.   metFORMIN 1000 MG tablet Commonly known as: GLUCOPHAGE Take 0.5 tablets (500 mg total) by mouth daily.   Simbrinza 1-0.2 % Susp Generic drug: Brinzolamide-Brimonidine Apply 1 drop to eye daily after supper.   telmisartan 80 MG tablet Commonly known as: MICARDIS Take 80 mg by mouth daily.   VICTOZA Mifflin Inject 0.6 mg into the skin daily.      Allergies  Allergen Reactions  . Cephalexin Anaphylaxis and Swelling  . Invokana [Canagliflozin]     Pt does not recall  . Lipitor [Atorvastatin Calcium] Other (See Comments)    myalgia  . Tramadol Nausea Only   Follow-up Information    [name], MD. Schedule an appointment as soon as possible for a visit in 1 week(s).   Specialty: Internal Medicine Contact information: [address] [place] [address] [place]              Follow up on 03/07/2019.   Why: tuesday March 07, 2019 at 1:15pm Contact information: [address] [phone]           The results of significant diagnostics from this hospitalization (including imaging, microbiology, ancillary and laboratory) are listed below for reference.    Significant Diagnostic Studies: No results found.  Microbiology: Recent Results (from the past 240 hour(s))  SARS CORONAVIRUS 2 (TAT 6-24 HRS) Nasopharyngeal Nasopharyngeal Swab     Status: None   Collection Time: 02/26/19  5:41 PM   Specimen: Nasopharyngeal Swab  Result Value Ref Range Status   SARS Coronavirus 2 NEGATIVE NEGATIVE Final    Comment:  (NOTE) SARS-CoV-2 target nucleic acids are NOT DETECTED. The SARS-CoV-2 RNA is generally detectable in upper and lower respiratory specimens during the acute phase of infection. Negative results do not preclude SARS-CoV-2 infection, do not rule out co-infections with other pathogens, and should not be used as the sole basis for treatment or other patient management decisions. Negative results must be combined with clinical observations, patient history, and epidemiological information. The expected result is Negative. Fact Sheet for Patients: SugarRoll.be Fact Sheet for Healthcare Providers: https://www.woods-mathews.com/ This test is not yet approved or cleared by the [place] FDA and  has been authorized for detection and/or diagnosis of SARS-CoV-2 by FDA under an Emergency Use Authorization (EUA). This EUA will remain  in effect (meaning this test can be used) for the duration of the COVID-19 declaration under Section 56 4(b)(1) of the Act, 21 U.S.C. section 360bbb-3(b)(1), unless the authorization is terminated or revoked sooner. Performed at [facility], [place]  [address], [place], [place] [address]   Aerobic Culture (superficial specimen)     Status: None (Preliminary result)   Collection Time: 02/26/19  7:00 PM   Specimen: Wound  Result Value Ref Range Status   Specimen Description WOUND LEFT LEG  Final   Special Requests NONE  Final   Gram Stain NO WBC SEEN NO ORGANISMS SEEN   Final   Culture   Final    NO GROWTH 2 DAYS Performed at [facility], [place] [address], [place], [place] [address]    Report Status PENDING  Incomplete  Culture, blood (routine x 2)     Status: None (Preliminary result)   Collection Time: 02/26/19  8:24 PM   Specimen: BLOOD  Result Value Ref Range Status   Specimen Description BLOOD LEFT ANTECUBITAL  Final   Special Requests   Final    BOTTLES DRAWN AEROBIC ONLY Blood Culture results may  not be optimal due to an inadequate volume of blood received in culture bottles   Culture   Final    NO GROWTH 2 DAYS Performed at [facility], [place] [address], [place], [place] [address]    Report Status PENDING  Incomplete  Culture, blood (routine x 2)     Status: None (Preliminary result)   Collection Time: 02/26/19  8:24 PM   Specimen: BLOOD LEFT HAND  Result Value Ref Range Status   Specimen Description BLOOD LEFT HAND  Final   Special Requests   Final    BOTTLES DRAWN AEROBIC ONLY Blood Culture adequate volume   Culture   Final    NO GROWTH 2 DAYS Performed at [facility], [place] [address] [place], [place] [address]    Report Status PENDING  Incomplete     Labs: Basic Metabolic Panel: Recent Labs  Lab 02/26/19 2024 02/27/19 0716 02/28/19 0418  NA 138 137 136  K 4.2 4.9 4.7  CL 102 103 99  CO2 26 26 25   GLUCOSE 136* 109* 110*  BUN 21 21 23   CREATININE 1.31* 1.18* 1.10*  CALCIUM 9.3 8.6* 9.0   Liver Function Tests: Recent Labs  Lab 02/26/19 2024 02/28/19 0418  AST 17 12*  ALT 11 8  ALKPHOS 80 62  BILITOT 0.7 0.5  PROT 6.2* 5.5*  ALBUMIN 3.8 3.1*   No results for input(s): LIPASE, AMYLASE in the last 168 hours. No results for input(s): AMMONIA in the last 168 hours. CBC: Recent Labs  Lab 02/26/19 2024 02/28/19 0418  WBC 10.8* 7.5  NEUTROABS 8.3*  --   HGB 11.7* 10.2*  HCT 36.2 33.3*  MCV 92.3 95.7  PLT PLATELET CLUMPS NOTED ON SMEAR, UNABLE TO ESTIMATE PLATELET CLUMPS NOTED ON SMEAR, UNABLE TO ESTIMATE   Cardiac Enzymes: No results for input(s): CKTOTAL, CKMB, CKMBINDEX, TROPONINI in the last 168 hours. BNP: BNP (last 3 results) No results for input(s): BNP in the last 8760 hours.  ProBNP (last 3 results) No results for input(s): PROBNP in the last 8760 hours.  CBG: Recent Labs  Lab 02/27/19 1240 02/27/19 1636 02/27/19 2051 02/28/19 0808 02/28/19 1140  GLUCAP 103* 93 134* 118* 83       Signed:  Domenic Polite MD.   [facility] 02/28/2019, 1:09 PM

## 2019-02-28 NOTE — Consult Note (Signed)
   [facility] Inpatient Consult   02/28/2019  [name] [birth year] [id number]    Patient screened for potential [place] Management services needed for 7 day and 30 day readmissions under her Medicare/ [employer] benefits.  Review of patient's medical record reveals that patient is: 85 y.o.femaleaortic stenosis,Chronic diastolic CHF,hypertension, diabetes mellitus, morbid obesity, and sleep apnea. Treated for Left leg/lower extremity wound  (trauma) with mild surrounding cellulitis.   Primary Care Provider is [name], MD with [facility], this office is listed to provide transition of care.  Plan: No current [facility] Care Management needs assessed.   Will follow with General EMMI calls post discharge.   Please place a [facility] Care Management consult as appropriate and for questions, please contact:  Edwena Felty A. Alaney Witter, BSN, RN-BC [facility] Liaison Cell: [phone]

## 2019-02-28 NOTE — Progress Notes (Signed)
Discharged home today.Personal belongings,discharged instructions , prescriptions given to patient . Verbalized understanding of instructions

## 2019-02-28 NOTE — Progress Notes (Signed)
Pharmacy Antibiotic Note  [name] is a 85 y.o. female admitted on 02/26/2019 with cellulitis.  Pharmacy has been consulted for unasyn and vancomycin dosing.  Presenting with redness of R leg despite treatment with doxycyline and augmentin. Has allergy listed to cephalosporins- has tolerated zosyn and augmentin in the past.  SCr improved to 1.10, WBC wnl and AF  Plan: Adjust vancomycin to 1500mg  IV every 48 hours (est AUC 490) Monitor renal function, Cx and clinical progression to narrow Vancomycin levels as needed  Weight: 219 lb 9.6 oz (99.6 kg)  Temp (24hrs), Avg:98.1 F (36.7 C), Min:98 F (36.7 C), Max:98.2 F (36.8 C)  Recent Labs  Lab 02/26/19 2024 02/27/19 0716 02/28/19 0418  WBC 10.8*  --  7.5  CREATININE 1.31* 1.18* 1.10*    Estimated Creatinine Clearance: 42.1 mL/min (A) (by C-G formula based on SCr of 1.1 mg/dL (H)).    Allergies  Allergen Reactions  . Cephalexin Anaphylaxis and Swelling  . Invokana [Canagliflozin]     Pt does not recall  . Lipitor [Atorvastatin Calcium] Other (See Comments)    myalgia  . Tramadol Nausea Only    Antimicrobials this admission: Vancomycin 11/1 >>  Unasyn 11/1 >> 11/2  Dose adjustments this admission: N/A  Microbiology results: 11/1 BCx: ngtd 11/1 Wound: sent  11/1 COVID: neg  Bertis Ruddy, PharmD Clinical Pharmacist Please check AMION for all [place] numbers 02/28/2019 10:04 AM

## 2019-03-01 LAB — AEROBIC CULTURE W GRAM STAIN (SUPERFICIAL SPECIMEN)
Culture: NO GROWTH
Gram Stain: NONE SEEN

## 2019-03-03 LAB — CULTURE, BLOOD (ROUTINE X 2)
Culture: NO GROWTH
Culture: NO GROWTH
Special Requests: ADEQUATE

## 2019-03-07 ENCOUNTER — Other Ambulatory Visit: Payer: Self-pay

## 2019-03-07 ENCOUNTER — Encounter (HOSPITAL_BASED_OUTPATIENT_CLINIC_OR_DEPARTMENT_OTHER): Payer: Medicare Other | Attending: Internal Medicine | Admitting: Internal Medicine

## 2019-03-07 DIAGNOSIS — I87312 Chronic venous hypertension (idiopathic) with ulcer of left lower extremity: Secondary | ICD-10-CM | POA: Diagnosis not present

## 2019-03-07 DIAGNOSIS — L97822 Non-pressure chronic ulcer of other part of left lower leg with fat layer exposed: Secondary | ICD-10-CM | POA: Insufficient documentation

## 2019-03-07 DIAGNOSIS — E11622 Type 2 diabetes mellitus with other skin ulcer: Secondary | ICD-10-CM | POA: Insufficient documentation

## 2019-03-07 DIAGNOSIS — Z85828 Personal history of other malignant neoplasm of skin: Secondary | ICD-10-CM | POA: Diagnosis not present

## 2019-03-07 DIAGNOSIS — S81812A Laceration without foreign body, left lower leg, initial encounter: Secondary | ICD-10-CM | POA: Diagnosis not present

## 2019-03-07 DIAGNOSIS — I110 Hypertensive heart disease with heart failure: Secondary | ICD-10-CM | POA: Insufficient documentation

## 2019-03-07 DIAGNOSIS — I872 Venous insufficiency (chronic) (peripheral): Secondary | ICD-10-CM | POA: Insufficient documentation

## 2019-03-07 DIAGNOSIS — Z7984 Long term (current) use of oral hypoglycemic drugs: Secondary | ICD-10-CM | POA: Diagnosis not present

## 2019-03-07 DIAGNOSIS — I350 Nonrheumatic aortic (valve) stenosis: Secondary | ICD-10-CM | POA: Insufficient documentation

## 2019-03-07 DIAGNOSIS — W228XXA Striking against or struck by other objects, initial encounter: Secondary | ICD-10-CM | POA: Insufficient documentation

## 2019-03-07 DIAGNOSIS — I5032 Chronic diastolic (congestive) heart failure: Secondary | ICD-10-CM | POA: Diagnosis not present

## 2019-03-13 NOTE — Progress Notes (Signed)
[name], [name] ([id number]) Visit Report for 03/07/2019 Abuse/Suicide Risk Screen Details Patient Name: Date of Service: [name], [name] 03/07/2019 1:15 PM Medical Record [id number] Patient Account Number: [id number] Date of Birth/Sex: Treating RN: Jan 29, 1931 (86 y.o. Female) Levan Hurst Primary Care Vivianna Piccini: [name] Other Clinician: Referring Eshaal Duby: Treating Shakima Nisley/Extender:[place], Rosilyn Mings, Casper Harrison in Treatment: 0 Abuse/Suicide Risk Screen Items Answer ABUSE RISK SCREEN: Has anyone close to you tried to hurt or harm you recentlyo No Do you feel uncomfortable with anyone in your familyo No Has anyone forced you do things that you didnt want to doo No Electronic Signature(s) Signed: 03/13/2019 5:59:11 PM By: Levan Hurst RN, BSN Entered By: Levan Hurst on 03/07/2019 14:45:33 -------------------------------------------------------------------------------- Activities of Daily Living Details Patient Name: Date of Service: [name], [name] 03/07/2019 1:15 PM Medical Record [id number] Patient Account Number: [id number] Date of Birth/Sex: Treating RN: [birth year] (85 y.o. Female) Levan Hurst Primary Care Williamson Cavanah: [name] Other Clinician: Referring Bryston Colocho: Treating Ellyana Crigler/Extender:[place], Rosilyn Mings, Casper Harrison in Treatment: 0 Activities of Daily Living Items Answer Activities of Daily Living (Please select one for each item) Drive Automobile Not Able Take Medications Completely Able Use Telephone Completely Able Care for Appearance Completely Able Use Toilet Completely Able Bath / Shower Completely Able Dress Self Completely Able Feed Self Completely Able Walk Completely Able Get In / Out Bed Completely Able Housework Completely Able Prepare Meals Need Assistance Handle Money Completely Able Shop for Self Need Assistance Electronic Signature(s) Signed: 03/13/2019 5:59:11 PM By: Levan Hurst RN, BSN Entered By:  Levan Hurst on 03/07/2019 14:46:28 -------------------------------------------------------------------------------- Education Screening Details Patient Name: Date of Service: [name], [name] 03/07/2019 1:15 PM Medical Record [id number] Patient Account Number: [id number] Date of Birth/Sex: Treating RN: 1931/01/18 (85 y.o. Female) Levan Hurst Primary Care Hyla Coard: [name] Other Clinician: Referring Raedyn Wenke: Treating Jaselyn Nahm/Extender:[place], Rosilyn Mings, Casper Harrison in Treatment: 0 Primary Learner Assessed: Patient Learning Preferences/Education Level/Primary Language Learning Preference: Explanation, Demonstration, Printed Material Highest Education Level: High School Preferred Language: English Cognitive Barrier Language Barrier: No Translator Needed: No Memory Deficit: No Emotional Barrier: No Cultural/Religious Beliefs Affecting Medical Care: No Physical Barrier Impaired Vision: No Impaired Hearing: No Decreased Hand dexterity: No Knowledge/Comprehension Knowledge Level: High Comprehension Level: High Ability to understand written High instructions: Ability to understand verbal High instructions: Motivation Anxiety Level: Calm Cooperation: Cooperative Education Importance: Acknowledges Need Interest in Health Problems: Asks Questions Perception: Coherent Willingness to Engage in Self- High Management Activities: Readiness to Engage in Self- High Management Activities: Electronic Signature(s) Signed: 03/13/2019 5:59:11 PM By: Levan Hurst RN, BSN Entered By: Levan Hurst on 03/07/2019 14:46:49 -------------------------------------------------------------------------------- Fall Risk Assessment Details Patient Name: Date of Service: [name], [name] 03/07/2019 1:15 PM Medical Record [id number] Patient Account Number: [id number] Date of Birth/Sex: Treating RN: 1931/02/16 (85 y.o. Female) Levan Hurst Primary Care Carinne Brandenburger: [name] Other Clinician: Referring Haruto Demaria: Treating Teva Bronkema/Extender:[place], Rosilyn Mings, Casper Harrison in Treatment: 0 Fall Risk Assessment Items Have you had 2 or more falls in the last 12 monthso 0 No Have you had any fall that resulted in injury in the last 12 monthso 0 No FALLS RISK SCREEN History of falling - immediate or within 3 months 0 No Secondary diagnosis (Do you have 2 or more medical diagnoseso) 15 Yes Ambulatory aid None/bed rest/wheelchair/nurse 0 No Crutches/cane/walker 15 Yes Furniture 0 No Intravenous therapy Access/Saline/Heparin Lock 0 No Weak (short steps with or without shuffle, stooped but able to lift head 0 No while walking, may seek support from furniture) Impaired (short  steps with shuffle, may have difficulty arising from chair, 0 No head down, impaired balance) Mental Status Oriented to own ability 0 Yes Overestimates or forgets limitations 0 No Risk Level: Medium Risk Score: 30 Electronic Signature(s) Signed: 03/13/2019 5:59:11 PM By: Levan Hurst RN, BSN Entered By: Levan Hurst on 03/07/2019 14:47:03 -------------------------------------------------------------------------------- Foot Assessment Details Patient Name: Date of Service: [name], [name] 03/07/2019 1:15 PM Medical Record [id number] Patient Account Number: [id number] Date of Birth/Sex: Treating RN: 07-04-1930 (85 y.o. Female) Levan Hurst Primary Care Demarques Pilz: [name] Other Clinician: Referring [place] Sarinana: Treating Zoye Chandra/Extender:[place], Rosilyn Mings, Casper Harrison in Treatment: 0 Foot Assessment Items Site Locations + = Sensation present, - = Sensation absent, C = Callus, U = Ulcer R = Redness, W = Warmth, M = Maceration, PU = Pre-ulcerative lesion F = Fissure, S = Swelling, D = Dryness Assessment Right: Left: Other Deformity: No No Prior Foot Ulcer: No No Prior Amputation: No No Charcot Joint: No No Ambulatory Status: Ambulatory With Help Assistance  Device: Cane Gait: Steady Electronic Signature(s) Signed: 03/13/2019 5:59:11 PM By: Levan Hurst RN, BSN Entered By: Levan Hurst on 03/07/2019 14:48:54 -------------------------------------------------------------------------------- Nutrition Risk Screening Details Patient Name: Date of Service: [name], [name] 03/07/2019 1:15 PM Medical Record [id number] Patient Account Number: [id number] Date of Birth/Sex: Treating RN: [birth year] (85 y.o. Female) Levan Hurst Primary Care Genesis Novosad: [name] Other Clinician: Referring Sherhonda Gaspar: Treating Jezebel Pollet/Extender:[place], Rosilyn Mings, Casper [place] in Treatment: 0 Height (in): 64 Weight (lbs): 220 Body Mass Index (BMI): 37.8 Nutrition Risk Screening Items Score Screening NUTRITION RISK SCREEN: I have an illness or condition that made me change the kind and/or 2 Yes amount of food I eat I eat fewer than two meals per day 0 No I eat few fruits and vegetables, or milk products 0 No I have three or more drinks of beer, liquor or wine almost every day 0 No I have tooth or mouth problems that make it hard for me to eat 0 No I don't always have enough money to buy the food I need 0 No I eat alone most of the time 0 No I take three or more different prescribed or over-the-counter drugs a day 1 Yes 0 No Without wanting to, I have lost or gained 10 pounds in the last six months I am not always physically able to shop, cook and/or feed myself 0 No Nutrition Protocols Good Risk Protocol Provide education on Moderate Risk Protocol 0 nutrition High Risk Proctocol Risk Level: Moderate Risk Score: 3 Electronic Signature(s) Signed: 03/13/2019 5:59:11 PM By: Levan Hurst RN, BSN Entered By: Levan Hurst on 03/07/2019 14:47:25

## 2019-03-14 ENCOUNTER — Encounter (HOSPITAL_BASED_OUTPATIENT_CLINIC_OR_DEPARTMENT_OTHER): Payer: Medicare Other | Admitting: Internal Medicine

## 2019-03-14 ENCOUNTER — Other Ambulatory Visit: Payer: Self-pay

## 2019-03-14 DIAGNOSIS — I5032 Chronic diastolic (congestive) heart failure: Secondary | ICD-10-CM | POA: Diagnosis not present

## 2019-03-14 DIAGNOSIS — I87312 Chronic venous hypertension (idiopathic) with ulcer of left lower extremity: Secondary | ICD-10-CM | POA: Diagnosis not present

## 2019-03-14 DIAGNOSIS — S81812A Laceration without foreign body, left lower leg, initial encounter: Secondary | ICD-10-CM | POA: Diagnosis not present

## 2019-03-14 DIAGNOSIS — L97822 Non-pressure chronic ulcer of other part of left lower leg with fat layer exposed: Secondary | ICD-10-CM | POA: Diagnosis not present

## 2019-03-14 DIAGNOSIS — Z7984 Long term (current) use of oral hypoglycemic drugs: Secondary | ICD-10-CM | POA: Diagnosis not present

## 2019-03-14 DIAGNOSIS — E11622 Type 2 diabetes mellitus with other skin ulcer: Secondary | ICD-10-CM | POA: Diagnosis not present

## 2019-03-14 DIAGNOSIS — I110 Hypertensive heart disease with heart failure: Secondary | ICD-10-CM | POA: Diagnosis not present

## 2019-03-20 ENCOUNTER — Encounter (HOSPITAL_BASED_OUTPATIENT_CLINIC_OR_DEPARTMENT_OTHER): Payer: Medicare Other | Admitting: Internal Medicine

## 2019-03-21 ENCOUNTER — Other Ambulatory Visit: Payer: Self-pay

## 2019-03-21 ENCOUNTER — Encounter (HOSPITAL_BASED_OUTPATIENT_CLINIC_OR_DEPARTMENT_OTHER): Payer: Medicare Other | Admitting: Physician Assistant

## 2019-03-21 DIAGNOSIS — I5032 Chronic diastolic (congestive) heart failure: Secondary | ICD-10-CM | POA: Diagnosis not present

## 2019-03-21 DIAGNOSIS — L97822 Non-pressure chronic ulcer of other part of left lower leg with fat layer exposed: Secondary | ICD-10-CM | POA: Diagnosis not present

## 2019-03-21 DIAGNOSIS — S81812A Laceration without foreign body, left lower leg, initial encounter: Secondary | ICD-10-CM | POA: Diagnosis not present

## 2019-03-21 DIAGNOSIS — E11622 Type 2 diabetes mellitus with other skin ulcer: Secondary | ICD-10-CM | POA: Diagnosis not present

## 2019-03-21 DIAGNOSIS — I110 Hypertensive heart disease with heart failure: Secondary | ICD-10-CM | POA: Diagnosis not present

## 2019-03-21 DIAGNOSIS — Z7984 Long term (current) use of oral hypoglycemic drugs: Secondary | ICD-10-CM | POA: Diagnosis not present

## 2019-03-21 NOTE — Progress Notes (Signed)
AVIAH, BUNTAIN ([id number]) Visit Report for 03/21/2019 Arrival Information Details Patient Name: Date of Service: [name], [name] 03/21/2019 7:00 AM Medical Record [id number] Patient Account Number: [id number] Date of Birth/Sex: Treating RN: March 12, 1931 (85 y.o. Martyn Malay, Vaughan Basta Primary Care Mannie Ohlin: [name] Other Clinician: Referring Lorren Splawn: Treating Derriona Branscom/Extender:Stone III, Jacolyn Reedy, Casper Harrison in Treatment: 2 Visit Information History Since Last Visit Added or deleted any medications: No Patient Arrived: Ambulatory Any new allergies or adverse reactions: No Arrival Time: 07:13 Had a fall or experienced change in No Accompanied By: spouse activities of daily living that may affect Transfer Assistance: None risk of falls: Patient Identification Verified: Yes Signs or symptoms of abuse/neglect since last No Secondary Verification Process Yes visito Completed: Hospitalized since last visit: No Patient Requires Transmission-Based No Implantable device outside of the clinic excluding No Precautions: cellular tissue based products placed in the center Patient Has Alerts: No since last visit: Has Dressing in Place as Prescribed: Yes Has Compression in Place as Prescribed: Yes Pain Present Now: No Electronic Signature(s) Signed: 03/21/2019 1:35:50 PM By: Baruch Gouty RN, BSN Entered By: Baruch Gouty on 03/21/2019 07:15:26 -------------------------------------------------------------------------------- Compression Therapy Details Patient Name: Date of Service: [name] 03/21/2019 7:00 AM Medical Record [id number] Patient Account Number: [id number] Date of Birth/Sex: Treating RN: 14-May-1930 (85 y.o. Elam Dutch Primary Care Satya Bohall: [name] Other Clinician: Referring Karon Cotterill: Treating Melvern Ramone/Extender:Stone III, Jacolyn Reedy, Casper Harrison in Treatment: 2 Compression Therapy Performed for Wound Wound #1 Left,Anterior  Lower Leg Assessment: Performed By: Clinician Baruch Gouty, RN Compression Type: Three Hydrologist) Signed: 03/21/2019 1:35:50 PM By: Baruch Gouty RN, BSN Entered By: Baruch Gouty on 03/21/2019 07:19:58 -------------------------------------------------------------------------------- Encounter Discharge Information Details Patient Name: Date of Service: Small, Teresa 03/21/2019 7:00 AM Medical Record [id number] Patient Account Number: [id number] Date of Birth/Sex: Treating RN: 24-Aug-1930 (85 y.o. Elam Dutch Primary Care Taeya Theall: [name] Other Clinician: Referring Joziah Dollins: Treating Gleb Mcguire/Extender:Stone III, Jacolyn Reedy, Casper Harrison in Treatment: 2 Encounter Discharge Information Items Discharge Condition: Stable Ambulatory Status: Ambulatory Discharge Destination: Home Transportation: Private Auto Accompanied By: self Schedule Follow-up Appointment: Yes Clinical Summary of Care: Patient Declined Electronic Signature(s) Signed: 03/21/2019 1:35:50 PM By: Baruch Gouty RN, BSN Entered By: Baruch Gouty on 03/21/2019 07:21:16 -------------------------------------------------------------------------------- Patient/Caregiver Education Details Patient Name: Date of Service: [name] 11/24/2020andnbsp7:00 AM Medical Record Patient Account Number: [id number] Number: Treating RN: Baruch Gouty Date of Birth/Gender: 08-17-1930 (85 y.o. Other Clinician: F) Treating Worthy Keeler Primary Care Physician: [name] Physician/Extender: Referring Physician: Liborio Nixon in Treatment: 2 Education Assessment Education Provided To: Patient Education Topics Provided Venous: Methods: Explain/Verbal Responses: Reinforcements needed, State content correctly Wound/Skin Impairment: Methods: Explain/Verbal Responses: Reinforcements needed, State content correctly Electronic Signature(s) Signed: 03/21/2019  1:35:50 PM By: Baruch Gouty RN, BSN Entered By: Baruch Gouty on 03/21/2019 07:20:58 -------------------------------------------------------------------------------- Wound Assessment Details Patient Name: Date of Service: Small, Teresa 03/21/2019 7:00 AM Medical Record [id number] Patient Account Number: [id number] Date of Birth/Sex: Treating RN: [birth year] (85 y.o. Elam Dutch Primary Care Saim Almanza: [name] Other Clinician: Referring Altie Savard: Treating Witney Huie/Extender:Stone III, Jacolyn Reedy, Casper Harrison in Treatment: 2 Wound Status Wound Number: 1 Primary Venous Leg Ulcer Etiology: Wound Location: Left Lower Leg - Anterior Wound Open Wounding Event: Trauma Status: Date Acquired: 02/26/2019 Comorbid Glaucoma, Congestive Heart Failure, Weeks Of Treatment: 2 History: Hypertension, Peripheral Venous Disease, Clustered Wound: No Type II Diabetes, Osteoarthritis, Neuropathy Wound Measurements Length: (cm) 1.2 Width: (cm) 0.9 Depth: (cm) 0.1 Area: (cm)  0.848 Volume: (cm) 0.085 Wound Description Classification: Full Thickness Without Exposed Suppo Structures Wound Flat and Intact Margin: Exudate Small Amount: Exudate Serosanguineous Type: Exudate red, brown Color: Wound Bed Granulation Amount: Large (67-100%) Granulation Quality: Red, Pink Necrotic Amount: None Present (0%) or After Cleansing: No Fibrino No Exposed Structure Exposed: No er (Subcutaneous Tissue) Exposed: Yes posed: No posed: No osed: No sed: No % Reduction in Area: 90.8% % Reduction in Volume: 90.8% Epithelialization: Medium (34-66%) Tunneling: No Undermining: No rt Foul Od Slough/ Fascia Fat Lay Tendon Ex Muscle Ex Joint Exp Bone Expo Treatment Notes Wound #1 (Left, Anterior Lower Leg) 2. Periwound Care Moisturizing lotion 3. Primary Dressing Applied Calcium Alginate Ag 4. Secondary Dressing Dry Gauze 6. Support Layer Applied 3 layer compression  Water quality scientist) Signed: 03/21/2019 1:35:50 PM By: Baruch Gouty RN, BSN Entered By: Baruch Gouty on 03/21/2019 07:19:13 -------------------------------------------------------------------------------- Sheldon Details Patient Name: Date of Service: [name] 03/21/2019 7:00 AM Medical Record [id number] Patient Account Number: [id number] Date of Birth/Sex: Treating RN: 1931-04-19 (85 y.o. Elam Dutch Primary Care Saleh Ulbrich: [name] Other Clinician: Referring Batsheva Stevick: Treating Ngozi Alvidrez/Extender:Stone III, Jacolyn Reedy, Casper Harrison in Treatment: 2 Vital Signs Time Taken: 07:15 Temperature (F): 98.4 Height (in): 64 Pulse (bpm): 72 Source: Stated Respiratory Rate (breaths/min): 18 Weight (lbs): 220 Blood Pressure (mmHg): 156/58 Source: Stated Capillary Blood Glucose (mg/dl): 130 Body Mass Index (BMI): 37.8 Reference Range: 80 - 120 mg / dl Notes glucose per pt report yesterday Electronic Signature(s) Signed: 03/21/2019 1:35:50 PM By: Baruch Gouty RN, BSN Entered By: Baruch Gouty on 03/21/2019 07:17:28

## 2019-03-21 NOTE — Progress Notes (Signed)
JOYE, DORADO ([id number]) Visit Report for 03/21/2019 SuperBill Details Patient Name: Date of Service: [name], [name] 03/21/2019 Medical Record [id number] Patient Account Number: [id number] Date of Birth/Sex: Treating RN: 1931/03/09 (85 y.o. Elam Dutch Primary Care Provider: [name] Other Clinician: Referring Provider: Treating Provider/Extender:Stone III, Jacolyn Reedy, Casper Harrison in Treatment: 2 Diagnosis Coding ICD-10 Codes Code Description [id number] Laceration without foreign body, left lower leg, subsequent encounter L97.821 Non-pressure chronic ulcer of other part of left lower leg limited to breakdown of skin I87.312 Chronic venous hypertension (idiopathic) with ulcer of left lower extremity Facility Procedures CPT4 Code Description Modifier Quantity [id number] (Facility Use Only) [phone] - APPLY MULTLAY COMPRS LWR LT LEG 1 Electronic Signature(s) Signed: 03/21/2019 1:35:50 PM By: Baruch Gouty RN, BSN Signed: 03/21/2019 10:29:27 PM By: Worthy Keeler PA-C Entered By: Baruch Gouty on 03/21/2019 07:21:29

## 2019-03-28 ENCOUNTER — Encounter (HOSPITAL_BASED_OUTPATIENT_CLINIC_OR_DEPARTMENT_OTHER): Payer: Medicare Other | Attending: Internal Medicine | Admitting: Internal Medicine

## 2019-03-28 ENCOUNTER — Other Ambulatory Visit: Payer: Self-pay

## 2019-03-28 DIAGNOSIS — E1140 Type 2 diabetes mellitus with diabetic neuropathy, unspecified: Secondary | ICD-10-CM | POA: Insufficient documentation

## 2019-03-28 DIAGNOSIS — I872 Venous insufficiency (chronic) (peripheral): Secondary | ICD-10-CM | POA: Diagnosis not present

## 2019-03-28 DIAGNOSIS — I5032 Chronic diastolic (congestive) heart failure: Secondary | ICD-10-CM | POA: Insufficient documentation

## 2019-03-28 DIAGNOSIS — Z85828 Personal history of other malignant neoplasm of skin: Secondary | ICD-10-CM | POA: Diagnosis not present

## 2019-03-28 DIAGNOSIS — L97821 Non-pressure chronic ulcer of other part of left lower leg limited to breakdown of skin: Secondary | ICD-10-CM | POA: Diagnosis not present

## 2019-03-28 DIAGNOSIS — E11622 Type 2 diabetes mellitus with other skin ulcer: Secondary | ICD-10-CM | POA: Insufficient documentation

## 2019-03-28 DIAGNOSIS — H409 Unspecified glaucoma: Secondary | ICD-10-CM | POA: Diagnosis not present

## 2019-03-28 DIAGNOSIS — M1990 Unspecified osteoarthritis, unspecified site: Secondary | ICD-10-CM | POA: Diagnosis not present

## 2019-03-28 DIAGNOSIS — I110 Hypertensive heart disease with heart failure: Secondary | ICD-10-CM | POA: Insufficient documentation

## 2019-03-28 DIAGNOSIS — E1151 Type 2 diabetes mellitus with diabetic peripheral angiopathy without gangrene: Secondary | ICD-10-CM | POA: Diagnosis present

## 2019-03-28 DIAGNOSIS — I350 Nonrheumatic aortic (valve) stenosis: Secondary | ICD-10-CM | POA: Diagnosis not present

## 2019-03-28 DIAGNOSIS — S81802A Unspecified open wound, left lower leg, initial encounter: Secondary | ICD-10-CM | POA: Diagnosis not present

## 2019-04-04 NOTE — Progress Notes (Signed)
TWINKLE, OPPERMANN ([id number]) Visit Report for 03/07/2019 Allergy List Details Patient Name: Date of Service: [name], [name] 03/07/2019 1:15 PM Medical Record [id number] Patient Account Number: [id number] Date of Birth/Sex: Treating RN: 25-Nov-1930 (85 y.o. Nancy Fetter Primary Care Chrisandra Wiemers: [name] Other Clinician: Referring Arael Piccione: Treating Tessy Pawelski/Extender:Robson, Rosilyn Mings, Casper Harrison in Treatment: 0 Allergies Active Allergies cephalexin Reaction: anaphylaxis Severity: Severe Invokana Reaction: unsure Lipitor Reaction: myalgia Severity: Moderate tramadol Reaction: nausea Severity: Moderate Allergy Notes Electronic Signature(s) Signed: 03/13/2019 5:59:11 PM By: Levan Hurst RN, BSN Entered By: Levan Hurst on 03/07/2019 14:40:09 -------------------------------------------------------------------------------- Arrival Information Details Patient Name: Date of Service: [name], [name] 03/07/2019 1:15 PM Medical Record [id number] Patient Account Number: [id number] Date of Birth/Sex: Treating RN: 11-15-1930 (85 y.o. Orvan Falconer Primary Care Eian Vandervelden: [name] Other Clinician: Referring Shanna Strength: Treating Theodus Ran/Extender:Robson, Rosilyn Mings, Casper Harrison in Treatment: 0 Visit Information Patient Arrived: Kasandra Knudsen Arrival Time: 14:16 Accompanied By: husband Transfer Assistance: None Patient Identification Verified: Yes Secondary Verification Process Completed: Yes Electronic Signature(s) Signed: 04/04/2019 3:00:22 PM By: Sandre Kitty Entered By: Sandre Kitty on 03/07/2019 14:22:00 -------------------------------------------------------------------------------- Clinic Level of Care Assessment Details Patient Name: Date of Service: [name], [name] 03/07/2019 1:15 PM Medical Record [id number] Patient Account Number: [id number] Date of Birth/Sex: Treating RN: 1930/10/11 (85 y.o. Orvan Falconer Primary Care  Sincere Berlanga: [name] Other Clinician: Referring Aleynah Rocchio: Treating Masa Lubin/Extender:Robson, Rosilyn Mings, Casper Harrison in Treatment: 0 Clinic Level of Care Assessment Items TOOL 1 Quantity Score X - Use when EandM and Procedure is performed on INITIAL visit 1 0 ASSESSMENTS - Nursing Assessment / Reassessment X - General Physical Exam (combine w/ comprehensive assessment (listed just below) 1 20 when performed on new pt. evals) X - Comprehensive Assessment (HX, ROS, Risk Assessments, Wounds Hx, etc.) 1 25 ASSESSMENTS - Wound and Skin Assessment / Reassessment []  - Dermatologic / Skin Assessment (not related to wound area) 0 ASSESSMENTS - Ostomy and/or Continence Assessment and Care []  - Incontinence Assessment and Management 0 []  - Ostomy Care Assessment and Management (repouching, etc.) 0 PROCESS - Coordination of Care X - Simple Patient / Family Education for ongoing care 1 15 []  - Complex (extensive) Patient / Family Education for ongoing care 0 X - Staff obtains Programmer, systems, Records, Test Results / Process Orders 1 10 []  - Staff telephones HHA, Nursing Homes / Clarify orders / etc 0 []  - Routine Transfer to another Facility (non-emergent condition) 0 []  - Routine Hospital Admission (non-emergent condition) 0 X - New Admissions / Biomedical engineer / Ordering NPWT, Apligraf, etc. 1 15 []  - Emergency Hospital Admission (emergent condition) 0 PROCESS - Special Needs []  - Pediatric / Minor Patient Management 0 []  - Isolation Patient Management 0 []  - Hearing / Language / Visual special needs 0 []  - Assessment of Community assistance (transportation, D/C planning, etc.) 0 []  - Additional assistance / Altered mentation 0 []  - Support Surface(s) Assessment (bed, cushion, seat, etc.) 0 INTERVENTIONS - Miscellaneous []  - External ear exam 0 []  - Patient Transfer (multiple staff / Civil Service fast streamer / Similar devices) 0 []  - Simple Staple / Suture removal (25 or less) 0 []  - Complex Staple /  Suture removal (26 or more) 0 []  - Hypo/Hyperglycemic Management (do not check if billed separately) 0 X - Ankle / Brachial Index (ABI) - do not check if billed separately 1 15 Has the patient been seen at the hospital within the last three years: Yes Total Score: 100 Level Of Care: New/Established - Level 3  Electronic Signature(s) Signed: 04/04/2019 2:53:59 PM By: Carlene Coria RN Entered By: Carlene Coria on 03/07/2019 15:24:54 -------------------------------------------------------------------------------- Lower Extremity Assessment Details Patient Name: Date of Service: [name], [name] 03/07/2019 1:15 PM Medical Record [id number] Patient Account Number: [id number] Date of Birth/Sex: Treating RN: [birth year] (85 y.o. Nancy Fetter Primary Care Vegas Fritze: [name] Other Clinician: Referring Lyndall Windt: Treating Aliany Fiorenza/Extender:Robson, Rosilyn Mings, Casper Harrison in Treatment: 0 Edema Assessment Assessed: [Left: No] [Right: No] E[Left: dema] [Right: :] Calf Left: Right: Point of Measurement: 30 cm From Medial Instep 39.2 cm cm Ankle Left: Right: Point of Measurement: 9 cm From Medial Instep 23.4 cm cm Vascular Assessment Pulses: Dorsalis Pedis Palpable: [Left:Yes] Blood Pressure: Brachial: [Left:160] Ankle: [Left:Dorsalis Pedis: 170 1.06] Electronic Signature(s) Signed: 03/13/2019 5:59:11 PM By: Levan Hurst RN, BSN Entered By: Levan Hurst on 03/07/2019 14:54:36 -------------------------------------------------------------------------------- Multi Wound Chart Details Patient Name: Date of Service: [name] 03/07/2019 1:15 PM Medical Record [id number] Patient Account Number: [id number] Date of Birth/Sex: Treating RN: February 14, 1931 (85 y.o. Orvan Falconer Primary Care Skarlett Sedlacek: [name] Other Clinician: Referring Doyel Mulkern: Treating Tobyn Osgood/Extender:Robson, Rosilyn Mings, Casper Harrison in Treatment: 0 Vital Signs Height(in): 64 Pulse(bpm):  81 Weight(lbs): 220 Blood Pressure(mmHg): 160/64 Body Mass Index(BMI): 38 Temperature(F): 98.1 Respiratory 18 Rate(breaths/min): Photos: [1:No Photos] [N/A:N/A] Wound Location: [1:Left Lower Leg - Anterior] [N/A:N/A] Wounding Event: [1:Trauma] [N/A:N/A] Primary Etiology: [1:Venous Leg Ulcer] [N/A:N/A] Comorbid History: [1:Glaucoma, Congestive Heart Failure, Hypertension, Peripheral Venous Disease, Type II Diabetes, Osteoarthritis, Neuropathy] [N/A:N/A] Date Acquired: [1:02/26/2019] [N/A:N/A] Weeks of Treatment: [1:0] [N/A:N/A] Wound Status: [1:Open] [N/A:N/A] Measurements L x W x D [1:2.5x4.7x0.1] [N/A:N/A] (cm) Area (cm) : [1:9.228] [N/A:N/A] Volume (cm) : [1:0.923] [N/A:N/A] Classification: [1:Full Thickness Without Exposed Support Structures] [N/A:N/A] Exudate Amount: [1:Medium] [N/A:N/A] Exudate Type: [1:Serosanguineous] [N/A:N/A] Exudate Color: [1:red, brown] [N/A:N/A] Wound Margin: [1:Flat and Intact] [N/A:N/A] Granulation Amount: [1:Medium (34-66%)] [N/A:N/A] Granulation Quality: [1:Pink] [N/A:N/A] Necrotic Amount: [1:Medium (34-66%)] [N/A:N/A] Exposed Structures: [1:Fat Layer (Subcutaneous N/A Tissue) Exposed: Yes Fascia: No Tendon: No Muscle: No Joint: No Bone: No] Epithelialization: [1:None] [N/A:N/A] Debridement: [1:Debridement - Excisional N/A] Pre-procedure [1:15:16] [N/A:N/A] Verification/Time Out Taken: Pain Control: [1:Other] [N/A:N/A] Tissue Debrided: [1:Subcutaneous, Slough] [N/A:N/A] Level: [1:Skin/Subcutaneous Tissue] [N/A:N/A] Debridement Area (sq cm):11.75 [N/A:N/A] Instrument: [1:Curette] [N/A:N/A] Bleeding: [1:Moderate] [N/A:N/A] Hemostasis Achieved: [1:Pressure] [N/A:N/A] Procedural Pain: [1:4] [N/A:N/A] Post Procedural Pain: [1:0] [N/A:N/A] Debridement Treatment Procedure was tolerated [N/A:N/A] Response: [1:well] Post Debridement [1:2.5x4.7x0.1] [N/A:N/A] Measurements L x W x D (cm) Post Debridement [1:0.923] [N/A:N/A] Volume:  (cm) Procedures Performed: Debridement [N/A:N/A] Treatment Notes Electronic Signature(s) Signed: 03/07/2019 5:17:49 PM By: Linton Ham MD Signed: 04/04/2019 2:53:59 PM By: Carlene Coria RN Entered By: Linton Ham on 03/07/2019 15:43:50 -------------------------------------------------------------------------------- Multi-Disciplinary Care Plan Details Patient Name: Date of Service: [name], [name] 03/07/2019 1:15 PM Medical Record [id number] Patient Account Number: [id number] Date of Birth/Sex: Treating RN: 1930-05-03 (85 y.o. Orvan Falconer Primary Care Breckin Zafar: [name] Other Clinician: Referring Riven Mabile: Treating Kimesha Claxton/Extender:Robson, Rosilyn Mings, Casper Harrison in Treatment: 0 Active Inactive Wound/Skin Impairment Nursing Diagnoses: Knowledge deficit related to ulceration/compromised skin integrity Goals: Patient/caregiver will verbalize understanding of skin care regimen Date Initiated: 03/07/2019 Target Resolution Date: 04/07/2019 Goal Status: Active Ulcer/skin breakdown will have a volume reduction of 30% by week 4 Date Initiated: 03/07/2019 Target Resolution Date: 04/07/2019 Goal Status: Active Interventions: Assess patient/caregiver ability to obtain necessary supplies Assess patient/caregiver ability to perform ulcer/skin care regimen upon admission and as needed Assess ulceration(s) every visit Notes: Electronic Signature(s) Signed: 04/04/2019 2:53:59 PM By: Carlene Coria RN Entered By: Carlene Coria on  03/07/2019 15:17:39 -------------------------------------------------------------------------------- Pain Assessment Details Patient Name: Date of Service: [name], [name] 03/07/2019 1:15 PM Medical Record [id number] Patient Account Number: [id number] Date of Birth/Sex: Treating RN: [birth year] (85 y.o. Nancy Fetter Primary Care Kenshin Splawn: [name] Other Clinician: Referring Lajuanda Penick: Treating Tyse Auriemma/Extender:Robson,  Rosilyn Mings, Casper Harrison in Treatment: 0 Active Problems Location of Pain Severity and Description of Pain Patient Has Paino Yes Site Locations Pain Location: Pain in Ulcers With Dressing Change: Yes Duration of the Pain. Constant / Intermittento Intermittent Rate the pain. Current Pain Level: 3 Worst Pain Level: 10 Least Pain Level: 0 Character of Pain Describe the Pain: Throbbing Pain Management and Medication Current Pain Management: Medication: Yes Cold Application: No Rest: No Massage: No Activity: No T.E.N.S.: No Heat Application: No Leg drop or elevation: No Is the Current Pain Management Adequate: Adequate How does your wound impact your activities of daily livingo Sleep: No Bathing: No Appetite: No Relationship With Others: No Bladder Continence: No Emotions: No Bowel Continence: No Work: No Toileting: No Drive: No Dressing: No Hobbies: No Electronic Signature(s) Signed: 03/13/2019 5:59:11 PM By: Levan Hurst RN, BSN Entered By: Levan Hurst on 03/07/2019 14:55:58 -------------------------------------------------------------------------------- Patient/Caregiver Education Details Patient Name: Date of Service: [name] 11/10/2020andnbsp1:15 PM Medical Record [id number] Patient Account Number: [id number] Date of Birth/Gender: Treating RN: March 18, 1931 (85 y.o. Orvan Falconer Primary Care Physician: [name] Other Clinician: Referring Physician: Treating Physician/Extender:Robson, Rosilyn Mings, Casper Harrison in Treatment: 0 Education Assessment Education Provided To: Patient Education Topics Provided Wound/Skin Impairment: Methods: Explain/Verbal Responses: State content correctly Electronic Signature(s) Signed: 04/04/2019 2:53:59 PM By: Carlene Coria RN Entered By: Carlene Coria on 03/07/2019 15:17:50 -------------------------------------------------------------------------------- Wound Assessment Details Patient Name: Date of  Service: [name], [name] 03/07/2019 1:15 PM Medical Record [id number] Patient Account Number: [id number] Date of Birth/Sex: Treating RN: January 28, 1931 (85 y.o. Nancy Fetter Primary Care Jamie-Lee Galdamez: [name] Other Clinician: Referring Tyianna Menefee: Treating Milli Woolridge/Extender:Robson, Rosilyn Mings, Casper Harrison in Treatment: 0 Wound Status Wound Number: 1 Primary Venous Leg Ulcer Etiology: Wound Location: Left Lower Leg - Anterior Wound Open Wounding Event: Trauma Status: Date Acquired: 02/26/2019 Comorbid Glaucoma, Congestive Heart Failure, Weeks Of Treatment: 0 History: Hypertension, Peripheral Venous Disease, Clustered Wound: No Type II Diabetes, Osteoarthritis, Neuropathy Photos Wound Measurements Length: (cm) 2.5 % Reduct Width: (cm) 4.7 % Reduct Depth: (cm) 0.1 Epitheli Area: (cm) 9.228 Tunneli Volume: (cm) 0.923 Undermi Wound Description Classification: Full Thickness Without Exposed Support Foul Od Structures Slough/ Wound Flat and Intact Margin: Exudate Medium Amount: Exudate Exudate Serosanguineous Type: Exudate red, brown Color: Wound Bed Granulation Amount: Medium (34-66%) Granulation Quality: Pink Fascia Expo Necrotic Amount: Medium (34-66%) Fat Layer ( Necrotic Quality: Adherent Slough Tendon Expo Muscle Expo Joint Expos Bone Expose or After Cleansing: No Fibrino No Exposed Structure sed: No Subcutaneous Tissue) Exposed: Yes sed: No sed: No ed: No d: No ion in Area: 0% ion in Volume: 0% alization: None ng: No ning: No Electronic Signature(s) Signed: 03/09/2019 3:27:21 PM By: Mikeal Hawthorne EMT/HBOT Signed: 03/13/2019 5:59:11 PM By: Levan Hurst RN, BSN Entered By: Mikeal Hawthorne on 03/09/2019 08:56:26 -------------------------------------------------------------------------------- Vitals Details Patient Name: Date of Service: [name], [name] 03/07/2019 1:15 PM Medical Record [id number] Patient Account Number:  [id number] Date of Birth/Sex: Treating RN: Dec 15, 1930 (85 y.o. Orvan Falconer Primary Care Ashleynicole Mcclees: [name] Other Clinician: Referring Dimitra Woodstock: Treating Pasqualina Colasurdo/Extender:Robson, Rosilyn Mings, Casper Harrison in Treatment: 0 Vital Signs Time Taken: 14:22 Temperature (F): 98.1 Height (in): 64 Pulse (bpm): 81 Source: Stated Respiratory Rate (breaths/min): 18  Weight (lbs): 220 Blood Pressure (mmHg): 160/64 Source: Stated Reference Range: 80 - 120 mg / dl Body Mass Index (BMI): 37.8 Electronic Signature(s) Signed: 04/04/2019 3:00:22 PM By: Sandre Kitty Entered By: Sandre Kitty on 03/07/2019 14:24:43

## 2019-04-04 NOTE — Progress Notes (Addendum)
ZAYLYN, SELLARDS ([id number]) Visit Report for 03/14/2019 HPI Details Patient Name: Date of Service: JAE, GAMBY 03/14/2019 1:00 PM Medical Record [id number] Patient Account Number: [id number] Date of Birth/Sex: Treating RN: [birth year] (85 y.o. Orvan Falconer Primary Care Provider: [name] Other Clinician: Referring Provider: Treating Provider/Extender:Shaheem Pichon, Rosilyn Mings, Casper Harrison in Treatment: 1 History of Present Illness HPI Description: ADMISSION 03/07/2019 Mrs. Doose is an 85 year old woman who on 02/17/2019 dropped a piece of wood on her leg. She was seen in the ER later that afternoon and the flap was readhered with Dermabond. She was seen again in the urgent care on 10/27 felt to have cellulitis of her left lower extremity and was started on doxycycline. After seeing her primary doctor she was admitted to hospital from 02/26/2019 through 02/28/2019 with mild cellulitis. She was given IV vancomycin and ceftriaxone discharged on doxycycline and Augmentin for 5 more days. Patient is a type II diabetic on oral agents. Past medical history; type 2 diabetes on oral agents, squamous cell skin cancer, hypertension, glaucoma, mild aortic valve stenosis, diastolic heart failure and several DVT rule outs in  link. She had a venous ablation in 2016 ABI in our clinic was 1.06 on the left 11/17; patient is an 85 year old woman who dropped a piece on her leg traumatizing her left leg. Ultimately this became infected and she was hospitalized earlier this month. She has a superficial wound in the mid tibia area.. Required debridement last week we use silver alginate under 3 layer compression Electronic Signature(s) Signed: 03/14/2019 6:07:35 PM By: Linton Ham MD Entered By: Linton Ham on 03/14/2019 14:35:52 -------------------------------------------------------------------------------- Physical Exam Details Patient Name: Date of Service: DESHAWN, MABRAY 03/14/2019 1:00 PM Medical Record [id number] Patient Account Number: [id number] Date of Birth/Sex: Treating RN: October 04, 1930 (85 y.o. Orvan Falconer Primary Care Provider: [name] Other Clinician: Referring Provider: Treating Provider/Extender:Laci Frenkel, Rosilyn Mings, Casper Harrison in Treatment: 1 Constitutional Sitting or standing Blood Pressure is within target range for patient.. Pulse regular and within target range for patient.Marland Kitchen Respirations regular, non-labored and within target range.. Temperature is normal and within the target range for the patient.Marland Kitchen Appears in no distress. Respiratory work of breathing is normal. Cardiovascular Fetal pulses are palpable. We have good edema control. Integumentary (Hair, Skin) No erythema around the wound. Psychiatric appears at normal baseline. Notes Wound exam; the area in question is in the left anterior mid tibia. Washed off with Anasept and gauze but no mechanical debridement is required. Surface granulation looks healthy. Surrounding epithelialization also healthy. Electronic Signature(s) Signed: 03/14/2019 6:07:35 PM By: Linton Ham MD Entered By: Linton Ham on 03/14/2019 14:36:58 -------------------------------------------------------------------------------- Physician Orders Details Patient Name: Date of Service: YESMIN, BISHOFF 03/14/2019 1:00 PM Medical Record [id number] Patient Account Number: [id number] Date of Birth/Sex: Treating RN: [birth year] (85 y.o. Orvan Falconer Primary Care Provider: [name] Other Clinician: Referring Provider: Treating Provider/Extender:Mykaylah Ballman, Rosilyn Mings, Casper Harrison in Treatment: 1 Verbal / Phone Orders: No Diagnosis Coding ICD-10 Coding Code Description S81.812D Laceration without foreign body, left lower leg, subsequent encounter L97.821 Non-pressure chronic ulcer of other part of left lower leg limited to breakdown of skin I87.312 Chronic venous  hypertension (idiopathic) with ulcer of left lower extremity Follow-up Appointments Return Appointment in 2 weeks. Nurse Visit: - next visit Dressing Change Frequency Do not change entire dressing for one week. Skin Barriers/Peri-Wound Care Barrier cream Wound Cleansing May shower and wash wound with soap and water. Primary Wound Dressing Wound #1 Left,Anterior Lower Leg Calcium  Alginate with Silver Secondary Dressing Dry Gauze ABD pad Edema Control 3 Layer Compression System - Left Lower Extremity Electronic Signature(s) Signed: 03/14/2019 6:07:35 PM By: Linton Ham MD Signed: 04/04/2019 2:50:47 PM By: Carlene Coria RN Entered By: Carlene Coria on 03/14/2019 14:30:13 -------------------------------------------------------------------------------- Problem List Details Patient Name: Date of Service: YORDANOS, BROCKIE 03/14/2019 1:00 PM Medical Record [id number] Patient Account Number: [id number] Date of Birth/Sex: Treating RN: 28-Dec-1930 (85 y.o. Orvan Falconer Primary Care Provider: [name] Other Clinician: Referring Provider: Treating Provider/Extender:Rudransh Bellanca, Rosilyn Mings, Casper Harrison in Treatment: 1 Active Problems ICD-10 Evaluated Encounter Code Description Active Date Today Diagnosis S81.812D Laceration without foreign body, left lower leg, 03/07/2019 No Yes subsequent encounter L97.821 Non-pressure chronic ulcer of other part of left lower 03/07/2019 No Yes leg limited to breakdown of skin I87.312 Chronic venous hypertension (idiopathic) with ulcer of 03/07/2019 No Yes left lower extremity Inactive Problems Resolved Problems Electronic Signature(s) Signed: 03/14/2019 6:07:35 PM By: Linton Ham MD Entered By: Linton Ham on 03/14/2019 14:34:28 -------------------------------------------------------------------------------- Progress Note Details Patient Name: Date of Service: [name] 03/14/2019 1:00 PM Medical Record  [id number] Patient Account Number: [id number] Date of Birth/Sex: Treating RN: 1931-01-20 (85 y.o. Orvan Falconer Primary Care Provider: [name] Other Clinician: Referring Provider: Treating Provider/Extender:Raunak Antuna, Rosilyn Mings, Casper Harrison in Treatment: 1 Subjective History of Present Illness (HPI) ADMISSION 03/07/2019 Mrs. Draus is an 85 year old woman who on 02/17/2019 dropped a piece of wood on her leg. She was seen in the ER later that afternoon and the flap was readhered with Dermabond. She was seen again in the urgent care on 10/27 felt to have cellulitis of her left lower extremity and was started on doxycycline. After seeing her primary doctor she was admitted to hospital from 02/26/2019 through 02/28/2019 with mild cellulitis. She was given IV vancomycin and ceftriaxone discharged on doxycycline and Augmentin for 5 more days. Patient is a type II diabetic on oral agents. Past medical history; type 2 diabetes on oral agents, squamous cell skin cancer, hypertension, glaucoma, mild aortic valve stenosis, diastolic heart failure and several DVT rule outs in [place] link. She had a venous ablation in 2016 ABI in our clinic was 1.06 on the left 11/17; patient is an 85 year old woman who dropped a piece on her leg traumatizing her left leg. Ultimately this became infected and she was hospitalized earlier this month. She has a superficial wound in the mid tibia area.. Required debridement last week we use silver alginate under 3 layer compression Objective Constitutional Sitting or standing Blood Pressure is within target range for patient.. Pulse regular and within target range for patient.Marland Kitchen Respirations regular, non-labored and within target range.. Temperature is normal and within the target range for the patient.Marland Kitchen Appears in no distress. Vitals Time Taken: 1:49 PM, Height: 64 in, Source: Stated, Weight: 220 lbs, Source: Stated, BMI: 37.8, Temperature: 98.5 F, Pulse:  89 bpm, Respiratory Rate: 18 breaths/min, Blood Pressure: 132/55 mmHg. Respiratory work of breathing is normal. Cardiovascular Fetal pulses are palpable. We have good edema control. Psychiatric appears at normal baseline. General Notes: Wound exam; the area in question is in the left anterior mid tibia. Washed off with Anasept and gauze but no mechanical debridement is required. Surface granulation looks healthy. Surrounding epithelialization also healthy. Integumentary (Hair, Skin) No erythema around the wound. Wound #1 status is Open. Original cause of wound was Trauma. The wound is located on the Left,Anterior Lower Leg. The wound measures 2.4cm length x 3.7cm width x 0.1cm depth; 6.974cm^2  area and 0.697cm^3 volume. There is Fat Layer (Subcutaneous Tissue) Exposed exposed. There is no tunneling or undermining noted. There is a small amount of serosanguineous drainage noted. The wound margin is flat and intact. There is large (67-100%) red, pink granulation within the wound bed. There is a small (1-33%) amount of necrotic tissue within the wound bed including Adherent Slough. Assessment Active Problems ICD-10 Laceration without foreign body, left lower leg, subsequent encounter Non-pressure chronic ulcer of other part of left lower leg limited to breakdown of skin Chronic venous hypertension (idiopathic) with ulcer of left lower extremity Procedures Wound #1 Pre-procedure diagnosis of Wound #1 is a Venous Leg Ulcer located on the Left,Anterior Lower Leg . There was a Three Layer Compression Therapy Procedure by Carlene Coria, RN. Post procedure Diagnosis Wound #1: Same as Pre-Procedure Plan Follow-up Appointments: Return Appointment in 2 weeks. Nurse Visit: - next visit Dressing Change Frequency: Do not change entire dressing for one week. Skin Barriers/Peri-Wound Care: Barrier cream Wound Cleansing: May shower and wash wound with soap and water. Primary Wound  Dressing: Wound #1 Left,Anterior Lower Leg: Calcium Alginate with Silver Secondary Dressing: Dry Gauze ABD pad Edema Control: 3 Layer Compression System - Left Lower Extremity 1. Wound area on the left mid tibia is improved. 2. No debridement required. 3. Still under 3 layer compression Electronic Signature(s) Signed: 03/14/2019 6:07:35 PM By: Linton Ham MD Entered By: Linton Ham on 03/14/2019 14:37:34 -------------------------------------------------------------------------------- SuperBill Details Patient Name: Date of Service: AADITRI, CHAVEZ 03/14/2019 Medical Record [id number] Patient Account Number: [id number] Date of Birth/Sex: Treating RN: [birth year] (85 y.o. Orvan Falconer Primary Care Provider: [name] Other Clinician: Referring Provider: Treating Provider/Extender:Evalee Gerard, Rosilyn Mings, Casper Harrison in Treatment: 1 Diagnosis Coding ICD-10 Codes Code Description [phone] Laceration without foreign body, left lower leg, subsequent encounter L97.821 Non-pressure chronic ulcer of other part of left lower leg limited to breakdown of skin I87.312 Chronic venous hypertension (idiopathic) with ulcer of left lower extremity Facility Procedures CPT4 Code Description: [id number] (Facility Use Only) [phone] - [facility] LT LEG Modifier: Quantity: 1 Physician Procedures CPT4 Code Description: [id number] [place] - WC PHYS LEVEL 3 - EST PT ICD-10 Diagnosis Description L97.821 Non-pressure chronic ulcer of other part of left lower leg l skin S81.812D Laceration without foreign body, left lower leg, subsequent I87.312 Chronic  venous hypertension (idiopathic) with ulcer of left Modifier: imited to breakdow encounter lower extremity Quantity: 1 n of Electronic Signature(s) Signed: 03/14/2019 6:07:35 PM By: Linton Ham MD Entered By: Linton Ham on 03/14/2019 14:37:58

## 2019-04-04 NOTE — Progress Notes (Signed)
[name], [name] ([id number]) Visit Report for 03/28/2019 Arrival Information Details Patient Name: Date of Service: [name], [name] 03/28/2019 10:00 AM Medical Record [id number] Patient Account Number: [id number] Date of Birth/Sex: Treating RN: [birth year] (85 y.o. Benjamine Sprague, Briant Cedar Primary Care Madi Bonfiglio: [name] Other Clinician: Referring Winferd Wease: Treating Kiko Ripp/Extender:Robson, Rosilyn Mings, Casper Harrison in Treatment: 3 Visit Information History Since Last Visit Cane Added or deleted any medications: No Patient Arrived: 11:13 Any new allergies or adverse reactions: No Arrival Time: alone Had a fall or experienced change in No Accompanied By: None activities of daily living that may affect Transfer Assistance: risk of falls: Patient Identification Verified: Yes Signs or symptoms of abuse/neglect since last No Secondary Verification Process Completed: Yes visito Patient Requires Transmission-Based No Hospitalized since last visit: No Precautions: Implantable device outside of the clinic excluding No Patient Has Alerts: No cellular tissue based products placed in the center since last visit: Has Dressing in Place as Prescribed: Yes Has Compression in Place as Prescribed: Yes Pain Present Now: No Electronic Signature(s) Signed: 04/03/2019 5:51:44 PM By: Levan Hurst RN, BSN Entered By: Levan Hurst on 03/28/2019 11:14:10 -------------------------------------------------------------------------------- Clinic Level of Care Assessment Details Patient Name: Date of Service: [name], [name] 03/28/2019 10:00 AM Medical Record [id number] Patient Account Number: [id number] Date of Birth/Sex: Treating RN: Nov 11, 1930 (85 y.o. Orvan Falconer Primary Care Khushboo Chuck: [name] Other Clinician: Referring Dhanush Jokerst: Treating Renton Berkley/Extender:Robson, Rosilyn Mings, Casper Harrison in Treatment: 3 Clinic Level of Care Assessment Items TOOL 4 Quantity Score X - Use  when only an EandM is performed on FOLLOW-UP visit 1 0 ASSESSMENTS - Nursing Assessment / Reassessment X - Reassessment of Co-morbidities (includes updates in patient status) 1 10 X - Reassessment of Adherence to Treatment Plan 1 5 ASSESSMENTS - Wound and Skin Assessment / Reassessment X - Simple Wound Assessment / Reassessment - one wound 1 5 []  - Complex Wound Assessment / Reassessment - multiple wounds 0 []  - Dermatologic / Skin Assessment (not related to wound area) 0 ASSESSMENTS - Focused Assessment []  - Circumferential Edema Measurements - multi extremities 0 []  - Nutritional Assessment / Counseling / Intervention 0 []  - Lower Extremity Assessment (monofilament, tuning fork, pulses) 0 []  - Peripheral Arterial Disease Assessment (using hand held doppler) 0 ASSESSMENTS - Ostomy and/or Continence Assessment and Care []  - Incontinence Assessment and Management 0 []  - Ostomy Care Assessment and Management (repouching, etc.) 0 PROCESS - Coordination of Care X - Simple Patient / Family Education for ongoing care 1 15 []  - Complex (extensive) Patient / Family Education for ongoing care 0 X - Staff obtains Programmer, systems, Records, Test Results / Process Orders 1 10 []  - Staff telephones HHA, Nursing Homes / Clarify orders / etc 0 []  - Routine Transfer to another Facility (non-emergent condition) 0 []  - Routine Hospital Admission (non-emergent condition) 0 []  - New Admissions / Biomedical engineer / Ordering NPWT, Apligraf, etc. 0 []  - Emergency Hospital Admission (emergent condition) 0 X - Simple Discharge Coordination 1 10 []  - Complex (extensive) Discharge Coordination 0 PROCESS - Special Needs []  - Pediatric / Minor Patient Management 0 []  - Isolation Patient Management 0 []  - Hearing / Language / Visual special needs 0 []  - Assessment of Community assistance (transportation, D/C planning, etc.) 0 []  - Additional assistance / Altered mentation 0 []  - Support Surface(s) Assessment  (bed, cushion, seat, etc.) 0 INTERVENTIONS - Wound Cleansing / Measurement X - Simple Wound Cleansing - one wound 1 5 []  - Complex Wound Cleansing -  multiple wounds 0 X - Wound Imaging (photographs - any number of wounds) 1 5 []  - Wound Tracing (instead of photographs) 0 X - Simple Wound Measurement - one wound 1 5 []  - Complex Wound Measurement - multiple wounds 0 INTERVENTIONS - Wound Dressings []  - Small Wound Dressing one or multiple wounds 0 []  - Medium Wound Dressing one or multiple wounds 0 []  - Large Wound Dressing one or multiple wounds 0 X - Application of Medications - topical 1 5 []  - Application of Medications - injection 0 INTERVENTIONS - Miscellaneous []  - External ear exam 0 []  - Specimen Collection (cultures, biopsies, blood, body fluids, etc.) 0 []  - Specimen(s) / Culture(s) sent or taken to Lab for analysis 0 []  - Patient Transfer (multiple staff / Civil Service fast streamer / Similar devices) 0 []  - Simple Staple / Suture removal (25 or less) 0 []  - Complex Staple / Suture removal (26 or more) 0 []  - Hypo / Hyperglycemic Management (close monitor of Blood Glucose) 0 []  - Ankle / Brachial Index (ABI) - do not check if billed separately 0 X - Vital Signs 1 5 Has the patient been seen at the hospital within the last three years: Yes Total Score: 80 Level Of Care: New/Established - Level 3 Electronic Signature(s) Signed: 04/04/2019 2:56:39 PM By: Carlene Coria RN Entered By: Carlene Coria on 03/28/2019 11:44:22 -------------------------------------------------------------------------------- Encounter Discharge Information Details Patient Name: Date of Service: [name] 03/28/2019 10:00 AM Medical Record [id number] Patient Account Number: [id number] Date of Birth/Sex: Treating RN: 1930-08-06 (85 y.o. Orvan Falconer Primary Care Celie Desrochers: [name] Other Clinician: Referring Monaye Blackie: Treating Florene Brill/Extender:Robson, Rosilyn Mings, Casper Harrison in Treatment:  3 Encounter Discharge Information Items Discharge Condition: Stable Ambulatory Status: Cane Discharge Destination: Home Transportation: Private Auto Accompanied By: husband Schedule Follow-up Appointment: Yes Clinical Summary of Care: Patient Declined Electronic Signature(s) Signed: 04/04/2019 2:56:39 PM By: Carlene Coria RN Entered By: Carlene Coria on 03/28/2019 11:54:05 -------------------------------------------------------------------------------- Lower Extremity Assessment Details Patient Name: Date of Service: [name], [name] 03/28/2019 10:00 AM Medical Record [id number] Patient Account Number: [id number] Date of Birth/Sex: Treating RN: Feb 26, 1931 (85 y.o. Nancy Fetter Primary Care Cherilyn Sautter: [name] Other Clinician: Referring Jaymin Waln: Treating Yania Bogie/Extender:Robson, Rosilyn Mings, Casper Harrison in Treatment: 3 Edema Assessment Assessed: [Left: No] [Right: No] Edema: [Left: Ye] [Right: s] Calf Left: Right: Point of Measurement: 30 cm From Medial Instep 39.5 cm cm Ankle Left: Right: Point of Measurement: 9 cm From Medial Instep 23 cm cm Vascular Assessment Pulses: Dorsalis Pedis Palpable: [Left:Yes] Electronic Signature(s) Signed: 04/03/2019 5:51:44 PM By: Levan Hurst RN, BSN Entered By: Levan Hurst on 03/28/2019 11:18:56 -------------------------------------------------------------------------------- Multi Wound Chart Details Patient Name: Date of Service: [name] 03/28/2019 10:00 AM Medical Record [id number] Patient Account Number: [id number] Date of Birth/Sex: Treating RN: 12-12-1930 (85 y.o. F) Primary Care Oday Ridings: [name] Other Clinician: Referring Freman Lapage: Treating Terence Bart/Extender:Robson, Rosilyn Mings, Casper Harrison in Treatment: 3 Vital Signs Height(in): 64 Capillary Blood 160 Glucose(mg/dl): Weight(lbs): 220 Pulse(bpm): 83 Body Mass Index(BMI): 56 Blood Pressure(mmHg): 142/64 Temperature(F):  97.7 Respiratory 18 Rate(breaths/min): Photos: [1:No Photos] [N/A:N/A] Wound Location: [1:Left Lower Leg - Anterior] [N/A:N/A] Wounding Event: [1:Trauma] [N/A:N/A] Primary Etiology: [1:Venous Leg Ulcer] [N/A:N/A] Comorbid History: [1:Glaucoma, Congestive Heart Failure, Hypertension, Peripheral Venous Disease, Type II Diabetes, Osteoarthritis, Neuropathy] [N/A:N/A] Date Acquired: [1:02/26/2019] [N/A:N/A] Weeks of Treatment: [1:3] [N/A:N/A] Wound Status: [1:Open] [N/A:N/A] Measurements L x W x D 0x0x0 [N/A:N/A] (cm) Area (cm) : [1:0] [N/A:N/A] Volume (cm) : [1:0] [N/A:N/A] % Reduction in Area: [  1:100.00%] [N/A:N/A] % Reduction in Volume: 100.00% [N/A:N/A] Classification: [1:Full Thickness Without Exposed Support Structures] [N/A:N/A] Exudate Amount: [1:None Present] [N/A:N/A] Wound Margin: [1:Flat and Intact] [N/A:N/A] Granulation Amount: [1:None Present (0%)] [N/A:N/A] Necrotic Amount: [1:None Present (0%)] [N/A:N/A] Exposed Structures: [1:Fascia: No Fat Layer (Subcutaneous Tissue) Exposed: No Tendon: No Muscle: No Joint: No Bone: No Large (67-100%)] [N/A:N/A N/A] Treatment Notes Electronic Signature(s) Signed: 03/28/2019 6:45:39 PM By: Linton Ham MD Entered By: Linton Ham on 03/28/2019 11:55:02 -------------------------------------------------------------------------------- Multi-Disciplinary Care Plan Details Patient Name: Date of Service: [name], [name] 03/28/2019 10:00 AM Medical Record [id number] Patient Account Number: [id number] Date of Birth/Sex: Treating RN: 1930-05-13 (85 y.o. Orvan Falconer Primary Care Makenzie Weisner: [name] Other Clinician: Referring Caylyn Tedeschi: Treating Tiras Bianchini/Extender:Robson, Rosilyn Mings, Casper Harrison in Treatment: 3 Active Inactive Electronic Signature(s) Signed: 04/04/2019 2:56:39 PM By: Carlene Coria RN Entered By: Carlene Coria on 03/28/2019  11:43:06 -------------------------------------------------------------------------------- Pain Assessment Details Patient Name: Date of Service: [name], [name] 03/28/2019 10:00 AM Medical Record [id number] Patient Account Number: [id number] Date of Birth/Sex: Treating RN: January 04, 1931 (85 y.o. Nancy Fetter Primary Care Tonna Palazzi: [name] Other Clinician: Referring Disa Riedlinger: Treating Newell Wafer/Extender:Robson, Rosilyn Mings, Casper Harrison in Treatment: 3 Active Problems Location of Pain Severity and Description of Pain Patient Has Paino No Site Locations Pain Management and Medication Current Pain Management: Electronic Signature(s) Signed: 04/03/2019 5:51:44 PM By: Levan Hurst RN, BSN Entered By: Levan Hurst on 03/28/2019 11:14:29 -------------------------------------------------------------------------------- Patient/Caregiver Education Details Patient Name: Date of Service: [name], Teresa B. 12/1/2020andnbsp10:00 AM Medical Record [id number] Patient Account Number: [id number] Date of Birth/Gender: Treating RN: Jan 10, 1931 (85 y.o. Orvan Falconer Primary Care Physician: [name] Other Clinician: Referring Physician: Treating Physician/Extender:Robson, Rosilyn Mings, Casper Harrison in Treatment: 3 Education Assessment Education Provided To: Patient Education Topics Provided Wound/Skin Impairment: Methods: Explain/Verbal Responses: State content correctly Electronic Signature(s) Signed: 04/04/2019 2:56:39 PM By: Carlene Coria RN Entered By: Carlene Coria on 03/28/2019 11:43:21 -------------------------------------------------------------------------------- Wound Assessment Details Patient Name: Date of Service: [name], [name] 03/28/2019 10:00 AM Medical Record [id number] Patient Account Number: [id number] Date of Birth/Sex: Treating RN: Jan 01, 1931 (85 y.o. Nancy Fetter Primary Care Cassy Sprowl: [name] Other Clinician: Referring Gresham Caetano:  Treating Keshona Kartes/Extender:Robson, Rosilyn Mings, Casper Harrison in Treatment: 3 Wound Status Wound Number: 1 Primary Venous Leg Ulcer Etiology: Wound Location: Left Lower Leg - Anterior Wound Not Healed Wounding Event: Trauma Status: Date Acquired: 02/26/2019 Comorbid Glaucoma, Congestive Heart Failure, Weeks Of Treatment: 3 History: Hypertension, Peripheral Venous Disease, Clustered Wound: No Type II Diabetes, Osteoarthritis, Neuropathy Photos Wound Measurements Length: (cm) 0 % Reduct Width: (cm) 0 % Reduct Depth: (cm) 0 Epitheli Area: (cm) 0 Tunneli Volume: (cm) 0 Undermi Wound Description Classification: Full Thickness Without Exposed Support Structures Wound Flat and Intact Margin: Exudate None Present Amount: Wound Bed Granulation Amount: None Present (0%) Necrotic Amount: None Present (0%) Foul Odor After Cleansing: No Slough/Fibrino No Exposed Structure Fascia Exposed: No Fat Layer (Subcutaneous Tissue) Exposed: No Tendon Exposed: No Muscle Exposed: No Joint Exposed: No Bone Exposed: No ion in Area: 100% ion in Volume: 100% alization: Large (67-100%) ng: No ning: No Electronic Signature(s) Signed: 03/29/2019 4:25:17 PM By: Mikeal Hawthorne EMT/HBOT Signed: 04/03/2019 5:51:44 PM By: Levan Hurst RN, BSN Entered By: Mikeal Hawthorne on 03/29/2019 09:24:35 -------------------------------------------------------------------------------- Vitals Details Patient Name: Date of Service: [name]. 03/28/2019 10:00 AM Medical Record [id number] Patient Account Number: [id number] Date of Birth/Sex: Treating RN: [birth year] (85 y.o. Nancy Fetter Primary Care Cyndi Montejano: [name] Other Clinician: Referring Selby Slovacek: Treating Amyiah Gaba/Extender:Robson, Rosilyn Mings,  Casper Harrison in Treatment: 3 Vital Signs Time Taken: 11:14 Temperature (F): 97.7 Height (in): 64 Pulse (bpm): 70 Weight (lbs): 220 Respiratory Rate (breaths/min): 18 Body Mass Index  (BMI): 37.8 Blood Pressure (mmHg): 142/64 Capillary Blood Glucose (mg/dl): 160 Reference Range: 80 - 120 mg / dl Notes glucose per pt report Electronic Signature(s) Signed: 04/03/2019 5:51:44 PM By: Levan Hurst RN, BSN Entered By: Levan Hurst on 03/28/2019 11:17:03

## 2019-04-04 NOTE — Progress Notes (Signed)
[name], [name] ([id number]) Visit Report for 03/14/2019 Arrival Information Details Patient Name: Date of Service: [name], [name] 03/14/2019 1:00 PM Medical Record [id number] Patient Account Number: [id number] Date of Birth/Sex: Treating RN: 1931/02/20 (85 y.o. [name], [name] Primary Care Thierno Hun: [name] Other Clinician: Referring Vail Vuncannon: Treating Jumaane Weatherford/Extender:Robson, Rosilyn Mings, Casper Harrison in Treatment: 1 Visit Information History Since Last Visit Added or deleted any medications: No Patient Arrived: [name] Any new allergies or adverse reactions: No Arrival Time: 13:48 spouse Had a fall or experienced change in No Accompanied By: activities of daily living that may affect Transfer Assistance: None risk of falls: Patient Identification Verified: Yes Signs or symptoms of abuse/neglect since last No Secondary Verification Process Completed: Yes visito Patient Requires Transmission-Based No Hospitalized since last visit: No Precautions: Implantable device outside of the clinic excluding No Patient Has Alerts: No cellular tissue based products placed in the center since last visit: Has Dressing in Place as Prescribed: Yes Has Compression in Place as Prescribed: Yes Pain Present Now: Yes Electronic Signature(s) Signed: 03/15/2019 5:42:21 PM By: Baruch Gouty RN, BSN Entered By: Baruch Gouty on 03/14/2019 13:49:10 -------------------------------------------------------------------------------- Compression Therapy Details Patient Name: Date of Service: [name] 03/14/2019 1:00 PM Medical Record [id number] Patient Account Number: [id number] Date of Birth/Sex: Treating RN: 12-25-1930 (85 y.o. [name] Primary Care Lovelyn Sheeran: [name] Other Clinician: Referring Reed Eifert: Treating Nickolis Diel/Extender:Robson, Rosilyn Mings, Casper Harrison in Treatment: 1 Compression Therapy Performed for Wound Wound #1 Left,Anterior Lower  Leg Assessment: Performed By: Clinician Carlene Coria, RN Compression Type: Three Layer Post Procedure Diagnosis Same as Pre-procedure Electronic Signature(s) Signed: 04/04/2019 2:50:47 PM By: Carlene Coria RN Entered By: Carlene Coria on 03/14/2019 14:29:18 -------------------------------------------------------------------------------- Encounter Discharge Information Details Patient Name: Date of Service: [name], [name] 03/14/2019 1:00 PM Medical Record [id number] Patient Account Number: [id number] Date of Birth/Sex: Treating RN: 13-Dec-1930 (85 y.o. [name] Primary Care Delora Gravatt: [name] Other Clinician: Referring Orpheus Hayhurst: Treating Nidia Grogan/Extender:Robson, Rosilyn Mings, Casper Harrison in Treatment: 1 Encounter Discharge Information Items Discharge Condition: Stable Ambulatory Status: Ambulatory Discharge Destination: Home Transportation: Private Auto Accompanied By: husband Schedule Follow-up Appointment: Yes Clinical Summary of Care: Patient Declined Electronic Signature(s) Signed: 03/20/2019 2:18:43 PM By: Teresa Hurst RN, BSN Entered By: [name] on 03/14/2019 18:16:06 -------------------------------------------------------------------------------- Lower Extremity Assessment Details Patient Name: Date of Service: [name], [name] 03/14/2019 1:00 PM Medical Record [id number] Patient Account Number: [id number] Date of Birth/Sex: Treating RN: 16-Jul-1930 (85 y.o. [name] Primary Care Tyran Huser: [name] Other Clinician: Referring Charvi Gammage: Treating Lamarion Mcevers/Extender:Robson, Rosilyn Mings, Casper Harrison in Treatment: 1 Edema Assessment Assessed: [Left: No] [Right: No] Edema: [Left: Ye] [Right: s] Calf Left: Right: Point of Measurement: 30 cm From Medial Instep 39.3 cm cm Ankle Left: Right: Point of Measurement: 9 cm From Medial Instep 23.3 cm cm Vascular Assessment Pulses: Dorsalis Pedis Palpable: [Left:Yes] Electronic  Signature(s) Signed: 03/15/2019 5:42:21 PM By: Baruch Gouty RN, BSN Entered By: Baruch Gouty on 03/14/2019 13:59:39 -------------------------------------------------------------------------------- Multi Wound Chart Details Patient Name: Date of Service: [name] 03/14/2019 1:00 PM Medical Record [id number] Patient Account Number: [id number] Date of Birth/Sex: Treating RN: 09/13/1930 (85 y.o. [name] Primary Care Hampton Cost: [name] Other Clinician: Referring Tychelle Purkey: Treating Jeneane Pieczynski/Extender:Robson, Rosilyn Mings, Casper Harrison in Treatment: 1 Vital Signs Height(in): 64 Pulse(bpm): 64 Weight(lbs): 220 Blood Pressure(mmHg): 132/55 Body Mass Index(BMI): 38 Temperature(F): 98.5 Respiratory 18 Rate(breaths/min): Photos: [1:No Photos] [N/A:N/A] Wound Location: [1:Left Lower Leg - Anterior] [N/A:N/A] Wounding Event: [1:Trauma] [N/A:N/A] Primary Etiology: [1:Venous Leg Ulcer] [  N/A:N/A] Comorbid History: [1:Glaucoma, Congestive Heart Failure, Hypertension, Peripheral Venous Disease, Type II Diabetes, Osteoarthritis, Neuropathy] [N/A:N/A] Date Acquired: [1:02/26/2019] [N/A:N/A] Weeks of Treatment: [1:1] [N/A:N/A] Wound Status: [1:Open] [N/A:N/A] Measurements L x W x D 2.4x3.7x0.1 [N/A:N/A] (cm) Area (cm) : [1:6.974] [N/A:N/A] Volume (cm) : [1:0.697] [N/A:N/A] % Reduction in Area: [1:24.40%] [N/A:N/A] % Reduction in Volume: [1:24.50%] [N/A:N/A] Classification: [1:Full Thickness Without Exposed Support Structures] [N/A:N/A] Exudate Amount: [1:Small] [N/A:N/A] Exudate Type: [1:Serosanguineous] [N/A:N/A] Exudate Color: [1:red, brown] [N/A:N/A] Wound Margin: [1:Flat and Intact] [N/A:N/A] Granulation Amount: [1:Large (67-100%)] [N/A:N/A] Granulation Quality: [1:Red, Pink] [N/A:N/A] Necrotic Amount: [1:Small (1-33%)] [N/A:N/A] Exposed Structures: [1:Fat Layer (Subcutaneous N/A Tissue) Exposed: Yes Fascia: No Tendon: No Muscle: No Joint: No Bone:  No] Epithelialization: [1:Medium (34-66%) Compression Therapy] [N/A:N/A N/A] Treatment Notes Electronic Signature(s) Signed: 03/14/2019 6:07:35 PM By: Teresa Ham MD Signed: 04/04/2019 2:50:47 PM By: Carlene Coria RN Entered By: [name] on 03/14/2019 14:34:40 -------------------------------------------------------------------------------- Multi-Disciplinary Care Plan Details Patient Name: Date of Service: MILTA, ROVITO 03/14/2019 1:00 PM Medical Record [id number] Patient Account Number: [id number] Date of Birth/Sex: Treating RN: [birth year] (85 y.o. [name] Primary Care Tiffany Talarico: [name] Other Clinician: Referring Serin Thornell: Treating Ramesses Crampton/Extender:Robson, Rosilyn Mings, Casper Harrison in Treatment: 1 Active Inactive Wound/Skin Impairment Nursing Diagnoses: Knowledge deficit related to ulceration/compromised skin integrity Goals: Patient/caregiver will verbalize understanding of skin care regimen Date Initiated: 03/07/2019 Target Resolution Date: 04/07/2019 Goal Status: Active Ulcer/skin breakdown will have a volume reduction of 30% by week 4 Date Initiated: 03/07/2019 Target Resolution Date: 04/07/2019 Goal Status: Active Interventions: Assess patient/caregiver ability to obtain necessary supplies Assess patient/caregiver ability to perform ulcer/skin care regimen upon admission and as needed Assess ulceration(s) every visit Notes: Electronic Signature(s) Signed: 04/04/2019 2:50:47 PM By: Carlene Coria RN Entered By: Carlene Coria on 03/14/2019 13:54:02 -------------------------------------------------------------------------------- Pain Assessment Details Patient Name: Date of Service: ENGLAND, SCHUG 03/14/2019 1:00 PM Medical Record [id number] Patient Account Number: [id number] Date of Birth/Sex: Treating RN: 30-Jan-1931 (85 y.o. [name] Primary Care Chikita Dogan: [name] Other Clinician: Referring Xue Low: Treating  Sibley Rolison/Extender:Robson, Rosilyn Mings, Casper Harrison in Treatment: 1 Active Problems Location of Pain Severity and Description of Pain Patient Has Paino Yes Site Locations Pain Location: Pain in Ulcers With Dressing Change: Yes Duration of the Pain. Constant / Intermittento Intermittent Rate the pain. Current Pain Level: 2 Worst Pain Level: 3 Least Pain Level: 0 Character of Pain Describe the Pain: Aching Pain Management and Medication Current Pain Management: Rest: Yes Is the Current Pain Management Adequate: Adequate How does your wound impact your activities of daily livingo Sleep: No Bathing: No Appetite: No Relationship With Others: No Bladder Continence: No Emotions: No Bowel Continence: No Work: No Toileting: No Drive: No Dressing: No Hobbies: No Electronic Signature(s) Signed: 03/15/2019 5:42:21 PM By: Baruch Gouty RN, BSN Entered By: Baruch Gouty on 03/14/2019 13:50:27 -------------------------------------------------------------------------------- Patient/Caregiver Education Details Patient Name: Date of Service: Mineo, Keishla B. 11/17/2020andnbsp1:00 PM Medical Record [id number] Patient Account Number: [id number] Date of Birth/Gender: Treating RN: [birth year] (85 y.o. [name] Primary Care Physician: [name] Other Clinician: Referring Physician: Treating Physician/Extender:Robson, Rosilyn Mings, Casper Harrison in Treatment: 1 Education Assessment Education Provided To: Patient Education Topics Provided Wound/Skin Impairment: Methods: Explain/Verbal Responses: State content correctly Electronic Signature(s) Signed: 04/04/2019 2:50:47 PM By: Carlene Coria RN Entered By: Carlene Coria on 03/14/2019 13:54:14 -------------------------------------------------------------------------------- Wound Assessment Details Patient Name: Date of Service: SARRAH, MURNAN 03/14/2019 1:00 PM Medical Record [id number] Patient Account Number:  [id number] Date of Birth/Sex: Treating RN: 1930/05/10 (85 y.o.  [name] Primary Care Theodore Rahrig: [name] Other Clinician: Referring Anshu Wehner: Treating Julie Paolini/Extender:Robson, Rosilyn Mings, Casper Harrison in Treatment: 1 Wound Status Wound Number: 1 Primary Venous Leg Ulcer Etiology: Wound Location: Left Lower Leg - Anterior Wound Open Wounding Event: Trauma Status: Date Acquired: 02/26/2019 ComorbidGlaucoma, Congestive Heart Failure, Comorbid Glaucoma, Congestive Heart Failure, Weeks Of Treatment: 1 History: Hypertension, Peripheral Venous Disease, Clustered Wound: No Type II Diabetes, Osteoarthritis, Neuropathy Photos Wound Measurements Length: (cm) 2.4 % Reduct Width: (cm) 3.7 % Reduct Depth: (cm) 0.1 Epitheli Area: (cm) 6.974 Tunneli Volume: (cm) 0.697 Undermi Wound Description Full Thickness Without Exposed Support Classification: Structures Wound Flat and Intact Margin: Exudate Small Amount: Exudate Serosanguineous Type: Exudate red, brown Color: Wound Bed Granulation Amount: Large (67-100%) Granulation Quality: Red, Pink Necrotic Amount: Small (1-33%) Necrotic Quality: Adherent Slough Foul Odor After Cleansing: No Slough/Fibrino No Exposed Structure Fascia Exposed: No Fat Layer (Subcutaneous Tissue) Exposed: Yes Tendon Exposed: No Muscle Exposed: No Joint Exposed: No Bone Exposed: No ion in Area: 24.4% ion in Volume: 24.5% alization: Medium (34-66%) ng: No ning: No Electronic Signature(s) Signed: 03/16/2019 6:27:04 PM By: Baruch Gouty RN, BSN Signed: 03/17/2019 4:45:58 PM By: Mikeal Hawthorne EMT/HBOT Previous Signature: 03/15/2019 5:42:21 PM Version By: Baruch Gouty RN, BSN Entered By: Mikeal Hawthorne on 03/16/2019 11:34:52 -------------------------------------------------------------------------------- Vitals Details Patient Name: Date of Service: ANNANICOLE, ENGESSER 03/14/2019 1:00 PM Medical Record [id number] Patient  Account Number: [id number] Date of Birth/Sex: Treating RN: January 28, 1931 (85 y.o. [name] Primary Care Secundino Ellithorpe: Other Clinician: [name] Referring Jarmon Javid: Treating Garlen Reinig/Extender:Robson, Rosilyn Mings, Casper Harrison in Treatment: 1 Vital Signs Time Taken: 13:49 Temperature (F): 98.5 Height (in): 64 Pulse (bpm): 89 Source: Stated Respiratory Rate (breaths/min): 18 Weight (lbs): 220 Blood Pressure (mmHg): 132/55 Source: Stated Reference Range: 80 - 120 mg / dl Body Mass Index (BMI): 37.8 Electronic Signature(s) Signed: 03/15/2019 5:42:21 PM By: Baruch Gouty RN, BSN Entered By: Baruch Gouty on 03/14/2019 13:49:56

## 2019-04-04 NOTE — Progress Notes (Signed)
AIJA, BOBADILLA ([id number]) Visit Report for 03/07/2019 Chief Complaint Document Details Patient Name: Date of Service: [name], [name] 03/07/2019 1:15 PM Medical Record [id number] Patient Account Number: [id number] Date of Birth/Sex: Treating RN: 04/04/1931 (85 y.o. Orvan Falconer Primary Care Provider: [name] Other Clinician: Referring Provider: Treating Provider/Extender:Robson, Rosilyn Mings, Casper Harrison in Treatment: 0 Information Obtained from: Patient Chief Complaint 03/07/2019; patient was referred here for review of the wound on her left anterior lower extremity which was initially a laceration Electronic Signature(s) Signed: 03/07/2019 5:17:49 PM By: Linton Ham MD Entered By: Linton Ham on 03/07/2019 15:53:15 -------------------------------------------------------------------------------- Debridement Details Patient Name: Date of Service: Teresa, Small 03/07/2019 1:15 PM Medical Record [id number] Patient Account Number: [id number] Date of Birth/Sex: Treating RN: 1930/05/25 (85 y.o. Orvan Falconer Primary Care Provider: [name] Other Clinician: Referring Provider: Treating Provider/Extender:Robson, Rosilyn Mings, Casper Harrison in Treatment: 0 Debridement Performed for Wound #1 Left,Anterior Lower Leg Assessment: Performed By: Physician Ricard Dillon., MD Debridement Type: Debridement Severity of Tissue Pre Fat layer exposed Debridement: Level of Consciousness (Pre- Awake and Alert procedure): Pre-procedure Verification/Time Out Taken: Yes - 15:16 Start Time: 15:16 Pain Control: Other : bezocaine, 20% Total Area Debrided (L x W): 2.5 (cm) x 4.7 (cm) = 11.75 (cm) Tissue and other material Viable, Non-Viable, Slough, Subcutaneous, Skin: Dermis , Skin: Epidermis, Slough Viable, Non-Viable, Slough, Subcutaneous, Skin: Dermis , Skin: Epidermis, Slough debrided: Level: Skin/Subcutaneous Tissue Debridement Description:  Excisional Instrument: Curette Bleeding: Moderate Hemostasis Achieved: Pressure End Time: 15:20 Procedural Pain: 4 Post Procedural Pain: 0 Response to Treatment: Procedure was tolerated well Level of Consciousness Awake and Alert (Post-procedure): Post Debridement Measurements of Total Wound Length: (cm) 2.5 Width: (cm) 4.7 Depth: (cm) 0.1 Volume: (cm) 0.923 Character of Wound/Ulcer Post Improved Debridement: Severity of Tissue Post Debridement: Fat layer exposed Post Procedure Diagnosis Same as Pre-procedure Electronic Signature(s) Signed: 03/07/2019 5:17:49 PM By: Linton Ham MD Signed: 04/04/2019 2:53:59 PM By: Carlene Coria RN Entered By: Linton Ham on 03/07/2019 15:45:44 -------------------------------------------------------------------------------- HPI Details Patient Name: Date of Service: Teresa, Small 03/07/2019 1:15 PM Medical Record [id number] Patient Account Number: [id number] Date of Birth/Sex: Treating RN: 1930-11-29 (85 y.o. Orvan Falconer Primary Care Provider: [name] Other Clinician: Referring Provider: Treating Provider/Extender:Robson, Rosilyn Mings, Casper Harrison in Treatment: 0 History of Present Illness HPI Description: ADMISSION 03/07/2019 Mrs. Vassar is an 85 year old woman who on 02/17/2019 dropped a piece of wood on her leg. She was seen in the ER later that afternoon and the flap was readhered with Dermabond. She was seen again in the urgent care on 10/27 felt to have cellulitis of her left lower extremity and was started on doxycycline. After seeing her primary doctor she was admitted to hospital from 02/26/2019 through 02/28/2019 with mild cellulitis. She was given IV vancomycin and ceftriaxone discharged on doxycycline and Augmentin for 5 more days. Patient is a type II diabetic on oral agents. Past medical history; type 2 diabetes on oral agents, squamous cell skin cancer, hypertension, glaucoma, mild aortic valve  stenosis, diastolic heart failure and several DVT rule outs in [place] link. She had a venous ablation in 2016 ABI in our clinic was 1.06 on the left Electronic Signature(s) Signed: 03/07/2019 5:17:49 PM By: Linton Ham MD Entered By: Linton Ham on 03/07/2019 15:55:44 -------------------------------------------------------------------------------- Physical Exam Details Patient Name: Date of Service: Teresa, Small 03/07/2019 1:15 PM Medical Record [id number] Patient Account Number: [id number] Date of Birth/Sex: Treating RN: [birth year] (85 y.o. F) Epps, Carrie Primary  Care Provider: [name] Other Clinician: Referring Provider: Treating Provider/Extender:Robson, Rosilyn Mings, Casper Harrison in Treatment: 0 Constitutional Patient is hypertensive.. Pulse regular and within target range for patient.Marland Kitchen Respirations regular, non-labored and within target range.. Temperature is normal and within the target range for the patient.Marland Kitchen Appears in no distress. Eyes Conjunctivae clear. No discharge.no icterus. Respiratory work of breathing is normal. Bilateral breath sounds are clear and equal in all lobes with no wheezes, rales or rhonchi.. Cardiovascular Heart rhythm and rate regular, without murmur or gallop. No systolic murmur heard. Pedal pulses strong on the left. Angina chronic venous insufficiency with mild edema. Integumentary (Hair, Skin) Changes of chronic venous insufficiency in her lower extremities. Psychiatric appears at normal baseline. Notes Wound exam; hearing questions on the left anterior mid tibia area. Washed off with Anasept and gauze and then debrided with a #5 curette to a clean looking wound surface. There is no evidence of surrounding infection. The patient clearly has venous hypertension dilated veins in her feet hemosiderin deposition paradoxically worse in the right leg. No evidence of PAD Electronic Signature(s) Signed: 03/07/2019 5:17:49 PM  By: Linton Ham MD Entered By: Linton Ham on 03/07/2019 16:00:38 -------------------------------------------------------------------------------- Physician Orders Details Patient Name: Date of Service: [name], [name] 03/07/2019 1:15 PM Medical Record [id number] Patient Account Number: [id number] Date of Birth/Sex: Treating RN: 1930-12-30 (85 y.o. Orvan Falconer Primary Care Provider: [name] Other Clinician: Referring Provider: Treating Provider/Extender:Robson, Rosilyn Mings, Casper Harrison in Treatment: 0 Verbal / Phone Orders: No Diagnosis Coding Follow-up Appointments Return Appointment in 1 week. Dressing Change Frequency Do not change entire dressing for one week. Skin Barriers/Peri-Wound Care Barrier cream Wound Cleansing May shower and wash wound with soap and water. Primary Wound Dressing Wound #1 Left,Anterior Lower Leg Calcium Alginate with Silver Secondary Dressing Dry Gauze ABD pad Edema Control 3 Layer Compression System - Left Lower Extremity Electronic Signature(s) Signed: 03/07/2019 5:17:49 PM By: Linton Ham MD Signed: 04/04/2019 2:53:59 PM By: Carlene Coria RN Entered By: Carlene Coria on 03/07/2019 15:23:17 -------------------------------------------------------------------------------- Problem List Details Patient Name: Date of Service: GEORJEAN, KICHLINE 03/07/2019 1:15 PM Medical Record [id number] Patient Account Number: [id number] Date of Birth/Sex: Treating RN: 1931/03/28 (85 y.o. Orvan Falconer Primary Care Provider: [name] Other Clinician: Referring Provider: Treating Provider/Extender:Robson, Rosilyn Mings, Casper Harrison in Treatment: 0 Active Problems ICD-10 Evaluated Encounter Code Description Active Date Today Diagnosis S81.812D Laceration without foreign body, left lower leg, 03/07/2019 No Yes subsequent encounter L97.821 Non-pressure chronic ulcer of other part of left lower 03/07/2019 No Yes leg limited  to breakdown of skin I87.312 Chronic venous hypertension (idiopathic) with ulcer of 03/07/2019 No Yes left lower extremity Inactive Problems Resolved Problems Electronic Signature(s) Signed: 03/07/2019 5:17:49 PM By: Linton Ham MD Entered By: Linton Ham on 03/07/2019 15:43:35 -------------------------------------------------------------------------------- Progress Note Details Patient Name: Date of Service: RAILEY, HEDGPETH 03/07/2019 1:15 PM Medical Record [id number] Patient Account Number: [id number] Date of Birth/Sex: Treating RN: [birth year] (85 y.o. Orvan Falconer Primary Care Provider: [name] Other Clinician: Referring Provider: Treating Provider/Extender:Robson, Rosilyn Mings, Casper Harrison in Treatment: 0 Subjective Chief Complaint Information obtained from Patient 03/07/2019; patient was referred here for review of the wound on her left anterior lower extremity which was initially a laceration History of Present Illness (HPI) ADMISSION 03/07/2019 Mrs. Troxell is an 85 year old woman who on 02/17/2019 dropped a piece of wood on her leg. She was seen in the ER later that afternoon and the flap was readhered with Dermabond. She was seen again  in the urgent care on 10/27 felt to have cellulitis of her left lower extremity and was started on doxycycline. After seeing her primary doctor she was admitted to hospital from 02/26/2019 through 02/28/2019 with mild cellulitis. She was given IV vancomycin and ceftriaxone discharged on doxycycline and Augmentin for 5 more days. Patient is a type II diabetic on oral agents. Past medical history; type 2 diabetes on oral agents, squamous cell skin cancer, hypertension, glaucoma, mild aortic valve stenosis, diastolic heart failure and several DVT rule outs in [place] link. She had a venous ablation in 2016 ABI in our clinic was 1.06 on the left Patient History Information obtained from Patient. Allergies cephalexin  (Severity: Severe, Reaction: anaphylaxis), Invokana (Reaction: unsure), Lipitor (Severity: Moderate, Reaction: myalgia), tramadol (Severity: Moderate, Reaction: nausea) Family History Stroke - Mother. Social History Never smoker, Marital Status - Married, Alcohol Use - Never, Drug Use - No History, Caffeine Use - Rarely. Medical History Eyes Patient has history of Glaucoma - both eyes Cardiovascular Patient has history of Congestive Heart Failure, Hypertension, Peripheral Venous Disease Endocrine Patient has history of Type II Diabetes Musculoskeletal Patient has history of Osteoarthritis Neurologic Patient has history of Neuropathy Patient is treated with Insulin, Oral Agents. Blood sugar is not tested. Medical And Surgical History Notes Cardiovascular Varicose veins, left venous ablation in 2016 Genitourinary CKD stage 3 Review of Systems (ROS) Constitutional Symptoms (General Health) Denies complaints or symptoms of Fatigue, Fever, Chills, Marked Weight Change. Ear/Nose/Mouth/Throat Denies complaints or symptoms of Chronic sinus problems or rhinitis. Respiratory Denies complaints or symptoms of Chronic or frequent coughs, Shortness of Breath. Gastrointestinal Denies complaints or symptoms of Frequent diarrhea, Nausea, Vomiting. Integumentary (Skin) Complains or has symptoms of Wounds - wound on left lower leg. Psychiatric Denies complaints or symptoms of Claustrophobia, Suicidal. Objective Constitutional Patient is hypertensive.. Pulse regular and within target range for patient.Marland Kitchen Respirations regular, non-labored and within target range.. Temperature is normal and within the target range for the patient.Marland Kitchen Appears in no distress. Vitals Time Taken: 2:22 PM, Height: 64 in, Source: Stated, Weight: 220 lbs, Source: Stated, BMI: 37.8, Temperature: 98.1 F, Pulse: 81 bpm, Respiratory Rate: 18 breaths/min, Blood Pressure: 160/64 mmHg. Eyes Conjunctivae clear. No discharge.no  icterus. Respiratory work of breathing is normal. Bilateral breath sounds are clear and equal in all lobes with no wheezes, rales or rhonchi.. Cardiovascular Heart rhythm and rate regular, without murmur or gallop. No systolic murmur heard. Pedal pulses strong on the left. Angina chronic venous insufficiency with mild edema. Psychiatric appears at normal baseline. General Notes: Wound exam; hearing questions on the left anterior mid tibia area. Washed off with Anasept and gauze and then debrided with a #5 curette to a clean looking wound surface. There is no evidence of surrounding infection. The patient clearly has venous hypertension dilated veins in her feet hemosiderin deposition paradoxically worse in the right leg. No evidence of PAD Integumentary (Hair, Skin) Changes of chronic venous insufficiency in her lower extremities. Wound #1 status is Open. Original cause of wound was Trauma. The wound is located on the Left,Anterior Lower Leg. The wound measures 2.5cm length x 4.7cm width x 0.1cm depth; 9.228cm^2 area and 0.923cm^3 volume. There is Fat Layer (Subcutaneous Tissue) Exposed exposed. There is no tunneling or undermining noted. There is a medium amount of serosanguineous drainage noted. The wound margin is flat and intact. There is medium (34-66%) pink granulation within the wound bed. There is a medium (34-66%) amount of necrotic tissue within the wound bed including  Adherent Slough. Assessment Active Problems ICD-10 Laceration without foreign body, left lower leg, subsequent encounter Non-pressure chronic ulcer of other part of left lower leg limited to breakdown of skin Chronic venous hypertension (idiopathic) with ulcer of left lower extremity Procedures Wound #1 Pre-procedure diagnosis of Wound #1 is a Venous Leg Ulcer located on the Left,Anterior Lower Leg .Severity of Tissue Pre Debridement is: Fat layer exposed. There was a Excisional Skin/Subcutaneous Tissue  Debridement with a total area of 11.75 sq cm performed by Ricard Dillon., MD. With the following instrument(s): Curette to remove Viable and Non-Viable tissue/material. Material removed includes Subcutaneous Tissue, Slough, Skin: Dermis, and Skin: Epidermis after achieving pain control using Other (bezocaine, 20%). No specimens were taken. A time out was conducted at 15:16, prior to the start of the procedure. A Moderate amount of bleeding was controlled with Pressure. The procedure was tolerated well with a pain level of 4 throughout and a pain level of 0 following the procedure. Post Debridement Measurements: 2.5cm length x 4.7cm width x 0.1cm depth; 0.923cm^3 volume. Character of Wound/Ulcer Post Debridement is improved. Severity of Tissue Post Debridement is: Fat layer exposed. Post procedure Diagnosis Wound #1: Same as Pre-Procedure Plan Follow-up Appointments: Return Appointment in 1 week. Dressing Change Frequency: Do not change entire dressing for one week. Skin Barriers/Peri-Wound Care: Barrier cream Wound Cleansing: May shower and wash wound with soap and water. Primary Wound Dressing: Wound #1 Left,Anterior Lower Leg: Calcium Alginate with Silver Secondary Dressing: Dry Gauze ABD pad Edema Control: 3 Layer Compression System - Left Lower Extremity 1. Silver alginate/ABD under 3 layer compression 2. No evidence of infection. The patient was told to finish her antibiotics. No cultures were done 3. Absolutely no evidence of arterial insufficiency. 4. This was a traumatic wound. I am not planning to order reflux studies unless this wound regresses or stalls Electronic Signature(s) Signed: 03/07/2019 5:17:49 PM By: Linton Ham MD Entered By: Linton Ham on 03/07/2019 16:01:37 -------------------------------------------------------------------------------- HxROS Details Patient Name: Date of Service: SARRINAH, HENN 03/07/2019 1:15 PM Medical Record  [id number] Patient Account Number: [id number] Date of Birth/Sex: Treating RN: 17-Dec-1930 (85 y.o. Nancy Fetter Primary Care Provider: [name] Other Clinician: Referring Provider: Treating Provider/Extender:Robson, Rosilyn Mings, Casper Harrison in Treatment: 0 Information Obtained From Patient Constitutional Symptoms (General Health) Complaints and Symptoms: Negative for: Fatigue; Fever; Chills; Marked Weight Change Ear/Nose/Mouth/Throat Complaints and Symptoms: Negative for: Chronic sinus problems or rhinitis Respiratory Complaints and Symptoms: Negative for: Chronic or frequent coughs; Shortness of Breath Gastrointestinal Complaints and Symptoms: Negative for: Frequent diarrhea; Nausea; Vomiting Integumentary (Skin) Complaints and Symptoms: Positive for: Wounds - wound on left lower leg Psychiatric Complaints and Symptoms: Negative for: Claustrophobia; Suicidal Eyes Medical History: Positive for: Glaucoma - both eyes Hematologic/Lymphatic Cardiovascular Medical History: Positive for: Congestive Heart Failure; Hypertension; Peripheral Venous Disease Past Medical History Notes: Varicose veins, left venous ablation in 2016 Endocrine Medical History: Positive for: Type II Diabetes Time with diabetes: 20 years Treated with: Insulin, Oral agents Blood sugar tested every day: No Genitourinary Medical History: Past Medical History Notes: CKD stage 3 Immunological Musculoskeletal Medical History: Positive for: Osteoarthritis Neurologic Medical History: Positive for: Neuropathy Oncologic HBO Extended History Items Eyes: Glaucoma Immunizations Pneumococcal Vaccine: Received Pneumococcal Vaccination: Yes Implantable Devices None Family and Social History Stroke: Yes - Mother; Never smoker; Marital Status - Married; Alcohol Use: Never; Drug Use: No History; Caffeine Use: Rarely; Financial Concerns: No; Food, Clothing or Shelter Needs: No; Support System  Lacking: No; Transportation Concerns: No  Electronic Signature(s) Signed: 03/07/2019 5:17:49 PM By: Linton Ham MD Signed: 03/13/2019 5:59:11 PM By: Levan Hurst RN, BSN Entered By: Levan Hurst on 03/07/2019 14:45:12 -------------------------------------------------------------------------------- [place] Details Patient Name: Date of Service: HALLEIGH, HARRELSON 03/07/2019 Medical Record [id number] Patient Account Number: [id number] Date of Birth/Sex: Treating RN: 1930-08-14 (85 y.o. Voncille Lo, [place] Primary Care Provider: [name] Other Clinician: Referring Provider: Treating Provider/Extender:Robson, Rosilyn Mings, Casper Harrison in Treatment: 0 Diagnosis Coding ICD-10 Codes Code Description [phone] Laceration without foreign body, left lower leg, subsequent encounter L97.821 Non-pressure chronic ulcer of other part of left lower leg limited to breakdown of skin I87.312 Chronic venous hypertension (idiopathic) with ulcer of left lower extremity Facility Procedures CPT4 Code Description: [id number] VISIT-LEV 3 EST PT [id number] [id number] - DEB SUBQ TISSUE 20 SQ CM/< ICD-10 Diagnosis Description S81.812D Laceration without foreign body, left lower leg, subsequent enc L97.821 Non-pressure chronic ulcer of  other part of left lower leg limi skin Modifier: 25 1 ounter ted to breakdown Quantity: 1 of Physician Procedures CPT4 Code Description: [id number] [id number] - WC PHYS SUBQ TISS 20 SQ CM ICD-10 Diagnosis Description S81.812D Laceration without foreign body, left lower leg, subsequen L97.821 Non-pressure chronic ulcer of other part of left lower leg skin Modifier: t encounter limited to bre Quantity: 1 akdown of Electronic Signature(s) Signed: 03/07/2019 5:17:49 PM By: Linton Ham MD Signed: 04/04/2019 2:53:59 PM By: Carlene Coria RN Entered By: Carlene Coria on 03/07/2019 16:04:40

## 2019-04-04 NOTE — Progress Notes (Signed)
ALLIANAH, OLAGUE ([id number]) Visit Report for 03/28/2019 HPI Details Patient Name: Date of Service: THAIZ, TROST 03/28/2019 10:00 AM Medical Record [id number] Patient Account Number: [id number] Date of Birth/Sex: Treating RN: 04-02-1931 (85 y.o. F) Primary Care Provider: [name] Other Clinician: Referring Provider: Treating Provider/Extender:Robson, Rosilyn Mings, Casper Harrison in Treatment: 3 History of Present Illness HPI Description: ADMISSION 03/07/2019 Mrs. Hedinger is an 85 year old woman who on 02/17/2019 dropped a piece of wood on her leg. She was seen in the ER later that afternoon and the flap was readhered with Dermabond. She was seen again in the urgent care on 10/27 felt to have cellulitis of her left lower extremity and was started on doxycycline. After seeing her primary doctor she was admitted to hospital from 02/26/2019 through 02/28/2019 with mild cellulitis. She was given IV vancomycin and ceftriaxone discharged on doxycycline and Augmentin for 5 more days. Patient is a type II diabetic on oral agents. Past medical history; type 2 diabetes on oral agents, squamous cell skin cancer, hypertension, glaucoma, mild aortic valve stenosis, diastolic heart failure and several DVT rule outs in [place] link. She had a venous ablation in 2016 ABI in our clinic was 1.06 on the left 11/17; patient is an 85 year old woman who dropped a piece on her leg traumatizing her left leg. Ultimately this became infected and she was hospitalized earlier this month. She has a superficial wound in the mid tibia area.. Required debridement last week we use silver alginate under 3 layer compression 12/1. This is an 85 year old woman who traumatized her left leg when she dropped a piece of wood on it. She has chronic venous insufficiency. She also has support stockings but does not wear them. The wound is closed over today. She does not have a chronic wound history in her lower  extremities Electronic Signature(s) Signed: 03/28/2019 6:45:39 PM By: Linton Ham MD Entered By: Linton Ham on 03/28/2019 11:55:54 -------------------------------------------------------------------------------- Physical Exam Details Patient Name: Date of Service: SHERRELLE, PEMBLETON 03/28/2019 10:00 AM Medical Record [id number] Patient Account Number: [id number] Date of Birth/Sex: Treating RN: [birth year] (85 y.o. F) Primary Care Provider: [name] Other Clinician: Referring Provider: Treating Provider/Extender:Robson, Rosilyn Mings, Casper Harrison in Treatment: 3 Constitutional Sitting or standing Blood Pressure is within target range for patient.. Pulse regular and within target range for patient.Marland Kitchen Respirations regular, non-labored and within target range.. Temperature is normal and within the target range for the patient.Marland Kitchen Appears in no distress. Cardiovascular ABI left 1.06 pedal pulses are palpable. Edema in the left leg is controlled under our wrap however the right leg is quite swollen nonpitting.. Notes Wound exam; the area in question is on the left anterior mid tibia. There is no open area here. Everything is epithelialized. Electronic Signature(s) Signed: 03/28/2019 6:45:39 PM By: Linton Ham MD Entered By: Linton Ham on 03/28/2019 11:57:07 -------------------------------------------------------------------------------- Physician Orders Details Patient Name: Date of Service: AILEANA, MATEUS 03/28/2019 10:00 AM Medical Record [id number] Patient Account Number: [id number] Date of Birth/Sex: Treating RN: [birth year] (85 y.o. Orvan Falconer Primary Care Provider: [name] Other Clinician: Referring Provider: Treating Provider/Extender:Robson, Rosilyn Mings, Casper Harrison in Treatment: 3 Verbal / Phone Orders: No Diagnosis Coding ICD-10 Coding Code Description S81.812D Laceration without foreign body, left lower leg, subsequent  encounter L97.821 Non-pressure chronic ulcer of other part of left lower leg limited to breakdown of skin I87.312 Chronic venous hypertension (idiopathic) with ulcer of left lower extremity Discharge From [facility] Services Discharge from [place] Signature(s) Signed: 03/28/2019  6:45:39 PM By: Linton Ham MD Signed: 04/04/2019 2:56:39 PM By: Carlene Coria RN Entered By: Carlene Coria on 03/28/2019 11:42:57 -------------------------------------------------------------------------------- Problem List Details Patient Name: Date of Service: ASEES, COOMER 03/28/2019 10:00 AM Medical Record [id number] Patient Account Number: [id number] Date of Birth/Sex: Treating RN: 03-20-1931 (85 y.o. Orvan Falconer Primary Care Provider: [name] Other Clinician: Referring Provider: Treating Provider/Extender:Robson, Rosilyn Mings, Casper Harrison in Treatment: 3 Active Problems ICD-10 Evaluated Encounter Code Description Active Date Today Diagnosis S81.812D Laceration without foreign body, left lower leg, 03/07/2019 No Yes subsequent encounter L97.821 Non-pressure chronic ulcer of other part of left lower 03/07/2019 No Yes leg limited to breakdown of skin I87.312 Chronic venous hypertension (idiopathic) with ulcer of 03/07/2019 No Yes left lower extremity Inactive Problems Resolved Problems Electronic Signature(s) Signed: 03/28/2019 6:45:39 PM By: Linton Ham MD Entered By: Linton Ham on 03/28/2019 11:54:57 -------------------------------------------------------------------------------- Progress Note Details Patient Name: Date of Service: [name] 03/28/2019 10:00 AM Medical Record [id number] Patient Account Number: [id number] Date of Birth/Sex: Treating RN: 02/19/1931 (85 y.o. F) Primary Care Provider: [name] Other Clinician: Referring Provider: Treating Provider/Extender:Robson, Rosilyn Mings, Casper Harrison in Treatment: 3 Subjective History  of Present Illness (HPI) ADMISSION 03/07/2019 Mrs. Garron is an 85 year old woman who on 02/17/2019 dropped a piece of wood on her leg. She was seen in the ER later that afternoon and the flap was readhered with Dermabond. She was seen again in the urgent care on 10/27 felt to have cellulitis of her left lower extremity and was started on doxycycline. After seeing her primary doctor she was admitted to hospital from 02/26/2019 through 02/28/2019 with mild cellulitis. She was given IV vancomycin and ceftriaxone discharged on doxycycline and Augmentin for 5 more days. Patient is a type II diabetic on oral agents. Past medical history; type 2 diabetes on oral agents, squamous cell skin cancer, hypertension, glaucoma, mild aortic valve stenosis, diastolic heart failure and several DVT rule outs in [place]. She had a venous ablation in 2016 ABI in our clinic was 1.06 on the left 11/17; patient is an 85 year old woman who dropped a piece on her leg traumatizing her left leg. Ultimately this became infected and she was hospitalized earlier this month. She has a superficial wound in the mid tibia area.. Required debridement last week we use silver alginate under 3 layer compression 12/1. This is an 85 year old woman who traumatized her left leg when she dropped a piece of wood on it. She has chronic venous insufficiency. She also has support stockings but does not wear them. The wound is closed over today. She does not have a chronic wound history in her lower extremities Objective Constitutional Sitting or standing Blood Pressure is within target range for patient.. Pulse regular and within target range for patient.Marland Kitchen Respirations regular, non-labored and within target range.. Temperature is normal and within the target range for the patient.Marland Kitchen Appears in no distress. Vitals Time Taken: 11:14 AM, Height: 64 in, Weight: 220 lbs, BMI: 37.8, Temperature: 97.7 F, Pulse: 70 bpm, Respiratory Rate:  18 breaths/min, Blood Pressure: 142/64 mmHg, Capillary Blood Glucose: 160 mg/dl. General Notes: glucose per pt report Cardiovascular ABI left 1.06 pedal pulses are palpable. Edema in the left leg is controlled under our wrap however the right leg is quite swollen nonpitting.. General Notes: Wound exam; the area in question is on the left anterior mid tibia. There is no open area here. Everything is epithelialized. Integumentary (Hair, Skin) Wound #1 status is Open. Original cause of  wound was Trauma. The wound is located on the Left,Anterior Lower Leg. The wound measures 0cm length x 0cm width x 0cm depth; 0cm^2 area and 0cm^3 volume. There is no tunneling or undermining noted. There is a none present amount of drainage noted. The wound margin is flat and intact. There is no granulation within the wound bed. There is no necrotic tissue within the wound bed. Assessment Active Problems ICD-10 Laceration without foreign body, left lower leg, subsequent encounter Non-pressure chronic ulcer of other part of left lower leg limited to breakdown of skin Chronic venous hypertension (idiopathic) with ulcer of left lower extremity Plan Discharge From [facility] Services: Discharge from [facility] 1. The patient to be discharged from the wound care center 2. She has support stockings at home I have asked her to consider wearing these. 3. Although she has significant venous insufficiency probably lymphedema I do not think she is prepared at this time to commit to stockings. If she comes back in short order were definitely going to need to revisit this. Electronic Signature(s) Signed: 03/28/2019 6:45:39 PM By: Linton Ham MD Entered By: Linton Ham on 03/28/2019 11:58:05 -------------------------------------------------------------------------------- SuperBill Details Patient Name: Date of Service: KEYAIRRA, HIRANI 03/28/2019 Medical Record [id number] Patient Account Number:  [id number] Date of Birth/Sex: Treating RN: 10/06/1930 (85 y.o. Orvan Falconer Primary Care Provider: [name] Other Clinician: Referring Provider: Treating Provider/Extender:Robson, Rosilyn Mings, Casper Harrison in Treatment: 3 Diagnosis Coding ICD-10 Codes Code Description [phone] Laceration without foreign body, left lower leg, subsequent encounter L97.821 Non-pressure chronic ulcer of other part of left lower leg limited to breakdown of skin I87.312 Chronic venous hypertension (idiopathic) with ulcer of left lower extremity Facility Procedures CPT4 Code: [id number] Description: 99213 - WOUND CARE VISIT-LEV 3 EST PT Modifier: Quantity: 1 Physician Procedures CPT4 Code Description: [id number] - WC PHYS LEVEL 2 - EST PT ICD-10 Diagnosis Description S81.812D Laceration without foreign body, left lower leg, subsequent L97.821 Non-pressure chronic ulcer of other part of left lower leg li skin I87.312 Chronic  venous hypertension (idiopathic) with ulcer of left l Modifier: encounter mited to breakdown ower extremity Quantity: 1 of Electronic Signature(s) Signed: 03/28/2019 6:45:39 PM By: Linton Ham MD Entered By: Linton Ham on 03/28/2019 11:58:20

## 2019-05-01 ENCOUNTER — Encounter: Payer: Self-pay | Admitting: Cardiology

## 2019-05-01 ENCOUNTER — Ambulatory Visit (INDEPENDENT_AMBULATORY_CARE_PROVIDER_SITE_OTHER): Payer: Medicare Other | Admitting: Cardiology

## 2019-05-01 ENCOUNTER — Other Ambulatory Visit: Payer: Self-pay

## 2019-05-01 VITALS — BP 136/81 | HR 86 | Temp 97.6°F | Resp 16 | Ht 64.0 in | Wt 226.0 lb

## 2019-05-01 DIAGNOSIS — I5032 Chronic diastolic (congestive) heart failure: Secondary | ICD-10-CM | POA: Diagnosis not present

## 2019-05-01 DIAGNOSIS — I340 Nonrheumatic mitral (valve) insufficiency: Secondary | ICD-10-CM

## 2019-05-01 DIAGNOSIS — I350 Nonrheumatic aortic (valve) stenosis: Secondary | ICD-10-CM | POA: Diagnosis not present

## 2019-05-01 DIAGNOSIS — I10 Essential (primary) hypertension: Secondary | ICD-10-CM | POA: Diagnosis not present

## 2019-05-01 NOTE — Progress Notes (Signed)
Primary Physician/Referring:  [name], MD  Patient ID: [name], female    DOB: Jul 06, 1930, 86 y.o.   MRN: [id number]  Chief Complaint  Patient presents with  . Congestive Heart Failure    6 month Follow up   HPI:    HPI: [name]  is a 86 y.o. female  aortic stenosis, hypertension, diabetes mellitus, morbid obesity, and sleep apnea. She is here for follow-up of aortic stenosis, hypertension, and shortness of breath. She reports no symptoms of shortness of breath, orthopnea, chest pain, light-headedness, or edema. She wears supplemental oxygen at night for sleep apnea as she cannot tolerate CPAP.   She now presents for 6 month  Denies chest pain or palpitations.  Continues to remain active.  No change in her weight.  Continues to have chronic leg edema.  She has had occasional episodes of paroxysmal nocturnal dyspnea.  Past Medical History:  Diagnosis Date  . Cellulitis and abscess of left leg   . CHF (congestive heart failure) ([place])   . COPD (chronic obstructive pulmonary disease) ([place])   . Diabetes mellitus   . Glaucoma   . Hyperlipidemia   . Hypertension    Past Surgical History:  Procedure Laterality Date  . BACK SURGERY    . CARDIAC CATHETERIZATION N/A 12/09/2015   Procedure: Right Heart Cath;  Surgeon: Adrian Prows, MD;  Location: [facility];  Service: Cardiovascular;  Laterality: N/A;  . CHOLECYSTECTOMY  2000  . HERNIA REPAIR  2007  . rupture disk  1970"s  . SHOULDER ARTHROSCOPY WITH ROTATOR CUFF REPAIR  1999   rt shoulder   Social History   Socioeconomic History  . Marital status: Married    Spouse name: Not on file  . Number of children: 5  . Years of education: Not on file  . Highest education level: Not on file  Occupational History  . Not on file  Tobacco Use  . Smoking status: Never Smoker  . Smokeless tobacco: Never Used  Substance and Sexual Activity  . Alcohol use: No  . Drug use: No  . Sexual activity: Not on file  Other  Topics Concern  . Not on file  Social History Narrative  . Not on file   Social Determinants of Health   Financial Resource Strain:   . Difficulty of Paying Living Expenses: Not on file  Food Insecurity:   . Worried About Charity fundraiser in the Last Year: Not on file  . Ran Out of Food in the Last Year: Not on file  Transportation Needs:   . Lack of Transportation (Medical): Not on file  . Lack of Transportation (Non-Medical): Not on file  Physical Activity:   . Days of Exercise per Week: Not on file  . Minutes of Exercise per Session: Not on file  Stress:   . Feeling of Stress : Not on file  Social Connections:   . Frequency of Communication with Friends and Family: Not on file  . Frequency of Social Gatherings with Friends and Family: Not on file  . Attends Religious Services: Not on file  . Active Member of Clubs or Organizations: Not on file  . Attends Archivist Meetings: Not on file  . Marital Status: Not on file  Intimate Partner Violence:   . Fear of Current or Ex-Partner: Not on file  . Emotionally Abused: Not on file  . Physically Abused: Not on file  . Sexually Abused: Not on file  ROS  Review of Systems  Constitution: Negative for chills, decreased appetite, malaise/fatigue and weight gain.  Cardiovascular: Positive for dyspnea on exertion and leg swelling. Negative for syncope.  Endocrine: Negative for cold intolerance.  Hematologic/Lymphatic: Does not bruise/bleed easily.  Musculoskeletal: Negative for joint swelling.  Gastrointestinal: Negative for abdominal pain, anorexia, change in bowel habit, hematochezia and melena.  Neurological: Negative for headaches and light-headedness.  Psychiatric/Behavioral: Negative for depression and substance abuse.  All other systems reviewed and are negative.  Objective  Blood pressure 136/81, pulse 86, temperature 97.6 F (36.4 C), temperature source Temporal, resp. rate 16, height 5' 4"  (1.626 m), weight  226 lb (102.5 kg), SpO2 96 %. Body mass index is 38.79 kg/m.   Vitals with BMI 05/01/2019 02/28/2019 02/28/2019  Height 5' 4"  - -  Weight 226 lbs - -  BMI 58.85 - -  Systolic 027 741 287  Diastolic 81 56 51  Pulse 86 - 74   Physical Exam  Constitutional: She appears well-developed. No distress.  Morbidly obese  HENT:  Head: Atraumatic.  Eyes: Conjunctivae are normal.  Neck: No thyromegaly present.  Short neck and difficult to evaluate JVP  Cardiovascular: Normal rate, regular rhythm, intact distal pulses and normal pulses. Exam reveals no gallop.  Murmur heard. High-pitched harsh midsystolic murmur is present with a grade of 2/6 at the upper right sternal border.  Blowing holosystolic murmur of grade 3/6 is also present at the apex. Pulses:      Carotid pulses are 2+ on the right side with bruit and 2+ on the left side with bruit.      Dorsalis pedis pulses are 2+ on the right side and 2+ on the left side.       Posterior tibial pulses are 2+ on the right side and 2+ on the left side.  Femoral and popliteal pulse difficult to feel due to patient's body habitus.  2 plus pitting bilateral below knee let edema.   Pulmonary/Chest: Effort normal and breath sounds normal.  Abdominal: Soft. Bowel sounds are normal.  Obese. Pannus present  Musculoskeletal:        General: No edema. Normal range of motion.     Cervical back: Neck supple.  Neurological: She is alert.  Skin: Skin is warm and dry.  Psychiatric: She has a normal mood and affect.   Radiology: No results found.  Laboratory examination:   Labs 11/05/2017: H/H 10.4/33.5.  MCV 93.  Platelets clumped and could not be calculated. Glucose 125.  BUN/creatinine 15/1.2.  EGFR 40.  Sodium 137, potassium 4.3. BNP 452.  CMP Latest Ref Rng & Units 02/28/2019 02/27/2019 02/26/2019  Glucose 70 - 99 mg/dL 110(H) 109(H) 136(H)  BUN 8 - 23 mg/dL 23 21 21   Creatinine 0.44 - 1.00 mg/dL 1.10(H) 1.18(H) 1.31(H)  Sodium 135 - 145 mmol/L 136 137  138  Potassium 3.5 - 5.1 mmol/L 4.7 4.9 4.2  Chloride 98 - 111 mmol/L 99 103 102  CO2 22 - 32 mmol/L 25 26 26   Calcium 8.9 - 10.3 mg/dL 9.0 8.6(L) 9.3  Total Protein 6.5 - 8.1 g/dL 5.5(L) - 6.2(L)  Total Bilirubin 0.3 - 1.2 mg/dL 0.5 - 0.7  Alkaline Phos 38 - 126 U/L 62 - 80  AST 15 - 41 U/L 12(L) - 17  ALT 0 - 44 U/L 8 - 11   CBC Latest Ref Rng & Units 02/28/2019 02/26/2019 11/05/2017  WBC 4.0 - 10.5 K/uL 7.5 10.8(H) 9.5  Hemoglobin 12.0 - 15.0 g/dL 10.2(L)  11.7(L) 10.4(L)  Hematocrit 36.0 - 46.0 % 33.3(L) 36.2 33.5(L)  Platelets 150 - 400 K/uL PLATELET CLUMPS NOTED ON SMEAR, UNABLE TO ESTIMATE PLATELET CLUMPS NOTED ON SMEAR, UNABLE TO ESTIMATE PLATELET CLUMPS NOTED ON SMEAR, UNABLE TO ESTIMATE   Lipid Panel     Component Value Date/Time   CHOL 131 10/26/2016 0912   TRIG 117 10/26/2016 0912   HDL 34 (L) 10/26/2016 0912   CHOLHDL 3.9 10/26/2016 0912   VLDL 23 10/26/2016 0912   LDLCALC 74 10/26/2016 0912   HEMOGLOBIN A1C Lab Results  Component Value Date   HGBA1C 6.7 (H) 10/24/2016   MPG 146 10/24/2016   TSH No results for input(s): TSH in the last 8760 hours. Medications   Current Outpatient Medications  Medication Instructions  . acetaminophen (TYLENOL) 650 mg, Oral, Every 8 hours PRN  . Actos 30 mg, Oral, Daily  . albuterol (PROVENTIL HFA;VENTOLIN HFA) 108 (90 Base) MCG/ACT inhaler 2 puffs, Inhalation, Every 6 hours PRN  . aspirin EC 81 mg, Oral, Daily  . Brinzolamide-Brimonidine (SIMBRINZA) 1-0.2 % SUSP 1 drop, Ophthalmic, Daily after supper  . collagenase (SANTYL) ointment Topical, Daily  . isosorbide mononitrate (IMDUR) 60 MG 24 hr tablet TAKE 1 TABLET DAILY  . Liraglutide (VICTOZA [place]) 0.6 mg, Subcutaneous, Daily  . LUMIGAN 0.01 % SOLN 1 drop, Both Eyes, Daily at bedtime  . metFORMIN (GLUCOPHAGE) 500 mg, Oral, Daily  . telmisartan (MICARDIS) 80 mg, Oral, Daily    Cardiac Studies:   Nuclear stress test [05/24/2015]:  1. The resting electrocardiogram  demonstrated normal sinus rhythm, normal resting conduction and no resting arrhythmias. Stress EKG is non-diagnostic for ischemia as it a pharmacologic stress using Lexiscan. Stress symptoms included dyspnea. 2. Myocardial perfusion imaging is normal. Overall left ventricular systolic function was normal without regional wall motion abnormalities. The left ventricular ejection fraction was 70%. No significant change from 12/19/12.  Carotid Doppler [12/08/2016]: No hemodynamically significant arterial disease in the internal carotid artery bilaterally. There is mixed plaque bilateral bulb. Antegrade right vertebral artery flow. Antegrade left vertebral artery flow.  Coronary Angiogram [12/09/2015]: Right Heart Cath;Mild to moderate pulmonary hypertension. RA 11/10/8, RV 52/5, EDP 11, PA 54/20, mean 34 mmHg. PA saturation 58%. PW 19/25, mean 17 mmHg. Post 600 g nitroglycerin: PA pressure 40/9, mean 23 mmHg. PW 11/11, mean 8 mmHg. CO 5.76, CI 2.77. QP/QS 0.84.  Echocardiogram 11/02/2018: Normal LV systolic function with EF 61%. Left ventricle cavity is normal in size. Normal left ventricular wall thickness. Normal global wall motion. Doppler evidence of grade I (impaired) diastolic dysfunction, normal LAP.  Trileaflet aortic valve with mild aortic valve leaflet calcification. Mild aortic valve stenosis. Aortic valve mean gradient of 11 mmHg, Vmax of 2.3  m/s. Calculated aortic valve area by continuity equation is 1.3 cm. Mild (Grade I) aortic regurgitation. Mild to moderate mitral regurgitation. Moderate tricuspid regurgitation. Estimated pulmonary artery systolic pressure is 46 mmHg.  Compared to previous study on 10/25/2016, tricuspid regurgitation is increased in severity. Pulmonary hypertension is new.   Assessment     ICD-10-CM   1. Chronic diastolic CHF (congestive heart failure) (HCC)  I50.32 EKG 12-Lead  2. Essential hypertension  I10   3. Mild aortic stenosis  I35.0   4. Moderate  mitral regurgitation  I34.0     EKG 05/01/2019: Sinus rhythm with first-degree block at rate of 89 bpm, left atrial abnormality, left axis deviation, left anterior fascicular block.  Poor R wave progression, cannot exclude anteroseptal infarct old.  LVH with repolarization  abnormality, cannot exclude high lateral ischemia.  Abnormal EKG. No significant change from  EKG 11/19/2017   Recommendations:   [name]  is a 86 y.o. female  aortic stenosis, Moderate mitral regurgitation, hypertension, diabetes mellitus, morbid obesity, and sleep apnea. She is here for follow-up of aortic stenosis, hypertension, and shortness of breath. She reports no symptoms of shortness of breath, orthopnea, chest pain, light-headedness, or edema. She wears supplemental oxygen at night for sleep apnea as she cannot tolerate CPAP.  This is a six-month office visit, she has chronic diastolic heart failure and has had episodes of PND, I have discussed with her regarding dietary changes that she needs to make.  She has had complete resolution of leg edema but was past 2-3 months again has recurrence again probably related to chronic diastolic heart failure.  Advised her that if she continues to have frequent episodes of PND she needs to contact me immediately.  No change in physical exam with regard to aortic stenosis or mitral regurgitation and no active congestive heart failure, no leg ulceration, continue present medical therapy as her blood pressure is also well controlled and I'll see her back in 6 months.     Adrian Prows, MD, [facility] 05/01/2019, 2:58 PM [facility] [place] Pager: [phone] Office: [phone] If no answer Cell [phone]

## 2019-05-09 DIAGNOSIS — N390 Urinary tract infection, site not specified: Secondary | ICD-10-CM | POA: Diagnosis not present

## 2019-05-09 DIAGNOSIS — I10 Essential (primary) hypertension: Secondary | ICD-10-CM | POA: Diagnosis not present

## 2019-05-15 ENCOUNTER — Ambulatory Visit: Payer: TRICARE For Life (TFL) | Admitting: Cardiology

## 2019-05-15 DIAGNOSIS — Z0000 Encounter for general adult medical examination without abnormal findings: Secondary | ICD-10-CM | POA: Diagnosis not present

## 2019-05-15 DIAGNOSIS — I10 Essential (primary) hypertension: Secondary | ICD-10-CM | POA: Diagnosis not present

## 2019-05-15 DIAGNOSIS — E538 Deficiency of other specified B group vitamins: Secondary | ICD-10-CM | POA: Diagnosis not present

## 2019-05-15 DIAGNOSIS — Z23 Encounter for immunization: Secondary | ICD-10-CM | POA: Diagnosis not present

## 2019-05-15 DIAGNOSIS — Z780 Asymptomatic menopausal state: Secondary | ICD-10-CM | POA: Diagnosis not present

## 2019-05-15 DIAGNOSIS — E119 Type 2 diabetes mellitus without complications: Secondary | ICD-10-CM | POA: Diagnosis not present

## 2019-05-23 ENCOUNTER — Ambulatory Visit: Payer: Medicare Other

## 2019-05-24 DIAGNOSIS — Z23 Encounter for immunization: Secondary | ICD-10-CM | POA: Diagnosis not present

## 2019-06-14 DIAGNOSIS — D045 Carcinoma in situ of skin of trunk: Secondary | ICD-10-CM | POA: Diagnosis not present

## 2019-06-14 DIAGNOSIS — L820 Inflamed seborrheic keratosis: Secondary | ICD-10-CM | POA: Diagnosis not present

## 2019-07-13 DIAGNOSIS — N762 Acute vulvitis: Secondary | ICD-10-CM | POA: Diagnosis not present

## 2019-07-13 DIAGNOSIS — N949 Unspecified condition associated with female genital organs and menstrual cycle: Secondary | ICD-10-CM | POA: Diagnosis not present

## 2019-07-13 DIAGNOSIS — R32 Unspecified urinary incontinence: Secondary | ICD-10-CM | POA: Diagnosis not present

## 2019-08-07 DIAGNOSIS — E538 Deficiency of other specified B group vitamins: Secondary | ICD-10-CM | POA: Diagnosis not present

## 2019-08-07 DIAGNOSIS — E119 Type 2 diabetes mellitus without complications: Secondary | ICD-10-CM | POA: Diagnosis not present

## 2019-08-07 DIAGNOSIS — I10 Essential (primary) hypertension: Secondary | ICD-10-CM | POA: Diagnosis not present

## 2019-08-08 DIAGNOSIS — N762 Acute vulvitis: Secondary | ICD-10-CM | POA: Diagnosis not present

## 2019-08-08 DIAGNOSIS — N949 Unspecified condition associated with female genital organs and menstrual cycle: Secondary | ICD-10-CM | POA: Diagnosis not present

## 2019-08-14 DIAGNOSIS — I10 Essential (primary) hypertension: Secondary | ICD-10-CM | POA: Diagnosis not present

## 2019-08-14 DIAGNOSIS — E119 Type 2 diabetes mellitus without complications: Secondary | ICD-10-CM | POA: Diagnosis not present

## 2019-08-14 DIAGNOSIS — E1140 Type 2 diabetes mellitus with diabetic neuropathy, unspecified: Secondary | ICD-10-CM | POA: Diagnosis not present

## 2019-08-14 DIAGNOSIS — E538 Deficiency of other specified B group vitamins: Secondary | ICD-10-CM | POA: Diagnosis not present

## 2019-08-24 DIAGNOSIS — L570 Actinic keratosis: Secondary | ICD-10-CM | POA: Diagnosis not present

## 2019-08-24 DIAGNOSIS — L820 Inflamed seborrheic keratosis: Secondary | ICD-10-CM | POA: Diagnosis not present

## 2019-08-24 DIAGNOSIS — X32XXXD Exposure to sunlight, subsequent encounter: Secondary | ICD-10-CM | POA: Diagnosis not present

## 2019-08-29 ENCOUNTER — Other Ambulatory Visit (HOSPITAL_COMMUNITY): Payer: Self-pay | Admitting: Orthopedic Surgery

## 2019-08-29 DIAGNOSIS — M7989 Other specified soft tissue disorders: Secondary | ICD-10-CM

## 2019-08-29 DIAGNOSIS — M79604 Pain in right leg: Secondary | ICD-10-CM | POA: Diagnosis not present

## 2019-08-29 DIAGNOSIS — M79605 Pain in left leg: Secondary | ICD-10-CM

## 2019-08-29 DIAGNOSIS — M25562 Pain in left knee: Secondary | ICD-10-CM | POA: Diagnosis not present

## 2019-08-29 DIAGNOSIS — M79661 Pain in right lower leg: Secondary | ICD-10-CM | POA: Diagnosis not present

## 2019-08-29 DIAGNOSIS — M79662 Pain in left lower leg: Secondary | ICD-10-CM | POA: Diagnosis not present

## 2019-08-30 ENCOUNTER — Other Ambulatory Visit: Payer: Self-pay

## 2019-08-30 ENCOUNTER — Ambulatory Visit (HOSPITAL_COMMUNITY)
Admission: RE | Admit: 2019-08-30 | Discharge: 2019-08-30 | Disposition: A | Discharge status: Home / Self Care | Payer: Medicare Other | Source: Ambulatory Visit | Attending: Orthopedic Surgery | Admitting: Orthopedic Surgery

## 2019-08-30 DIAGNOSIS — M7989 Other specified soft tissue disorders: Secondary | ICD-10-CM | POA: Diagnosis not present

## 2019-08-30 DIAGNOSIS — M79605 Pain in left leg: Secondary | ICD-10-CM | POA: Diagnosis not present

## 2019-08-30 NOTE — Progress Notes (Signed)
Lower extremity venous has been completed.   Preliminary results in CV Proc.   Abram Sander 08/30/2019 9:02 AM

## 2019-09-05 DIAGNOSIS — M25562 Pain in left knee: Secondary | ICD-10-CM | POA: Diagnosis not present

## 2019-09-21 DIAGNOSIS — L570 Actinic keratosis: Secondary | ICD-10-CM | POA: Diagnosis not present

## 2019-09-21 DIAGNOSIS — C44612 Basal cell carcinoma of skin of right upper limb, including shoulder: Secondary | ICD-10-CM | POA: Diagnosis not present

## 2019-09-21 DIAGNOSIS — X32XXXD Exposure to sunlight, subsequent encounter: Secondary | ICD-10-CM | POA: Diagnosis not present

## 2019-10-12 ENCOUNTER — Encounter: Payer: Self-pay | Admitting: Cardiology

## 2019-10-12 ENCOUNTER — Ambulatory Visit: Payer: Medicare Other | Admitting: Cardiology

## 2019-10-12 ENCOUNTER — Other Ambulatory Visit: Payer: Self-pay

## 2019-10-12 VITALS — BP 137/72 | HR 77 | Resp 12 | Ht 64.0 in | Wt 220.4 lb

## 2019-10-12 DIAGNOSIS — D528 Other folate deficiency anemias: Secondary | ICD-10-CM | POA: Diagnosis not present

## 2019-10-12 DIAGNOSIS — I340 Nonrheumatic mitral (valve) insufficiency: Secondary | ICD-10-CM | POA: Diagnosis not present

## 2019-10-12 DIAGNOSIS — D649 Anemia, unspecified: Secondary | ICD-10-CM

## 2019-10-12 DIAGNOSIS — I10 Essential (primary) hypertension: Secondary | ICD-10-CM | POA: Diagnosis not present

## 2019-10-12 DIAGNOSIS — R0602 Shortness of breath: Secondary | ICD-10-CM

## 2019-10-12 DIAGNOSIS — I5032 Chronic diastolic (congestive) heart failure: Secondary | ICD-10-CM

## 2019-10-12 DIAGNOSIS — R5381 Other malaise: Secondary | ICD-10-CM | POA: Diagnosis not present

## 2019-10-12 DIAGNOSIS — R5383 Other fatigue: Secondary | ICD-10-CM | POA: Diagnosis not present

## 2019-10-12 NOTE — Progress Notes (Signed)
Primary Physician/Referring:  [name], MD  Patient ID: [name], female    DOB: December 18, 1930, 86 y.o.   MRN: [id number]  Chief Complaint  Patient presents with  . Congestive Heart Failure  . Shortness of Breath  . Follow-up    6 month (worsening SOB)   HPI:    HPI: [name]  is a 86 y.o. female  aortic stenosis, hypertension, diabetes mellitus, morbid obesity, and sleep apnea. She is here for follow-up of aortic stenosis, hypertension, and shortness of breath. She reports no symptoms of shortness of breath, orthopnea, chest pain, light-headedness, or edema. She wears supplemental oxygen at night for sleep apnea as she cannot tolerate CPAP.   She now presents for 6 month  Denies chest pain or palpitations.  Continues to remain active.  No change in her weight.  Continues to have chronic fatigue and chronic dyspnea on exertion.  She has had occasional episodes of paroxysmal nocturnal dyspnea.  Past Medical History:  Diagnosis Date  . Cellulitis and abscess of left leg   . CHF (congestive heart failure) ([place])   . COPD (chronic obstructive pulmonary disease) ([place])   . Diabetes mellitus   . Glaucoma   . Hyperlipidemia   . Hypertension    Past Surgical History:  Procedure Laterality Date  . BACK SURGERY    . CARDIAC CATHETERIZATION N/A 12/09/2015   Procedure: Right Heart Cath;  Surgeon: Adrian Prows, MD;  Location: [facility];  Service: Cardiovascular;  Laterality: N/A;  . CHOLECYSTECTOMY  2000  . HERNIA REPAIR  2007  . rupture disk  1970"s  . SHOULDER ARTHROSCOPY WITH ROTATOR CUFF REPAIR  1999   rt shoulder   Social History   Tobacco Use  . Smoking status: Never Smoker  . Smokeless tobacco: Never Used  Substance Use Topics  . Alcohol use: No   Marital Status: Married   ROS  Review of Systems  Constitutional: Positive for malaise/fatigue.  Cardiovascular: Positive for dyspnea on exertion. Negative for chest pain and leg swelling.  Gastrointestinal:  Negative for melena.   Objective  Blood pressure 137/72, pulse 77, resp. rate 12, height 5' 4"  (1.626 m), weight 220 lb 6.4 oz (100 kg), SpO2 95 %. Body mass index is 37.83 kg/m.   Vitals with BMI 10/12/2019 05/01/2019 02/28/2019  Height 5' 4"  5' 4"  -  Weight 220 lbs 6 oz 226 lbs -  BMI 45.03 88.82 -  Systolic 800 349 179  Diastolic 72 81 56  Pulse 77 86 -   Physical Exam Constitutional:      Comments: Morbidly obese  Neck:     Thyroid: No thyromegaly.     Comments: Short neck and difficult to evaluate [facility] Cardiovascular:     Rate and Rhythm: Normal rate and regular rhythm.     Pulses: Normal pulses and intact distal pulses.          Carotid pulses are 2+ on the right side with bruit and 2+ on the left side with bruit.      Dorsalis pedis pulses are 2+ on the right side and 2+ on the left side.       Posterior tibial pulses are 2+ on the right side and 2+ on the left side.     Heart sounds: Murmur heard. High-pitched harsh midsystolic murmur is present with a grade of 2/6 at the upper right sternal border.  Blowing holosystolic murmur of grade 3/6 is also present at the apex.  No  gallop.      Comments: Femoral and popliteal pulse difficult to feel due to patient's body habitus.  2 plus pitting bilateral below knee let edema.  Pulmonary:     Breath sounds: Normal breath sounds.  Abdominal:     General: Bowel sounds are normal.     Palpations: Abdomen is soft.     Comments: Obese. Pannus present    Radiology: No results found.  Laboratory examination:   Labs 11/05/2017: H/H 10.4/33.5.  MCV 93.  Platelets clumped and could not be calculated. Glucose 125.  BUN/creatinine 15/1.2.  EGFR 40.  Sodium 137, potassium 4.3. BNP 452.  CMP Latest Ref Rng & Units 02/28/2019 02/27/2019 02/26/2019  Glucose 70 - 99 mg/dL 110(H) 109(H) 136(H)  BUN 8 - 23 mg/dL 23 21 21   Creatinine 0.44 - 1.00 mg/dL 1.10(H) 1.18(H) 1.31(H)  Sodium 135 - 145 mmol/L 136 137 138  Potassium 3.5 - 5.1 mmol/L 4.7  4.9 4.2  Chloride 98 - 111 mmol/L 99 103 102  CO2 22 - 32 mmol/L 25 26 26   Calcium 8.9 - 10.3 mg/dL 9.0 8.6(L) 9.3  Total Protein 6.5 - 8.1 g/dL 5.5(L) - 6.2(L)  Total Bilirubin 0.3 - 1.2 mg/dL 0.5 - 0.7  Alkaline Phos 38 - 126 U/L 62 - 80  AST 15 - 41 U/L 12(L) - 17  ALT 0 - 44 U/L 8 - 11   CBC Latest Ref Rng & Units 02/28/2019 02/26/2019 11/05/2017  WBC 4.0 - 10.5 K/uL 7.5 10.8(H) 9.5  Hemoglobin 12.0 - 15.0 g/dL 10.2(L) 11.7(L) 10.4(L)  Hematocrit 36 - 46 % 33.3(L) 36.2 33.5(L)  Platelets 150 - 400 K/uL PLATELET CLUMPS NOTED ON SMEAR, UNABLE TO ESTIMATE PLATELET CLUMPS NOTED ON SMEAR, UNABLE TO ESTIMATE PLATELET CLUMPS NOTED ON SMEAR, UNABLE TO ESTIMATE   External Labs:  Cholesterol, total 142.000 M 05/09/2019 HDL 44.000 M 05/09/2019 LDL-C 88.000 M 05/09/2018 Triglycerides 100.000 08/07/2019  A1C 6.200 % 05/09/2018 TSH 3.040 05/09/2019  Hemoglobin 10.200 02/28/2019   Creatinine, Serum 1.170 MG/ 05/09/2019 Potassium 4.700 02/28/2019 ALT (SGPT) 11.000 IU/ 05/09/2019  Medications   Current Outpatient Medications  Medication Instructions  . acetaminophen (TYLENOL) 650 mg, Oral, Every 8 hours PRN  . Actos 30 mg, Oral, Daily  . amLODipine (NORVASC) 2.5 mg, Oral, Daily  . aspirin EC 81 mg, Oral, Daily  . Brinzolamide-Brimonidine (SIMBRINZA) 1-0.2 % SUSP 1 drop, Ophthalmic, Daily after supper  . collagenase (SANTYL) ointment Topical, Daily  . isosorbide mononitrate (IMDUR) 60 MG 24 hr tablet TAKE 1 TABLET DAILY  . Liraglutide (VICTOZA [place]) 0.6 mg, Subcutaneous, Daily  . LUMIGAN 0.01 % SOLN 1 drop, Both Eyes, Daily at bedtime  . metFORMIN (GLUCOPHAGE) 500 mg, Oral, Daily  . telmisartan (MICARDIS) 80 mg, Oral, Daily    Cardiac Studies:   Nuclear stress test [05/24/2015]:  1. The resting electrocardiogram demonstrated normal sinus rhythm, normal resting conduction and no resting arrhythmias. Stress EKG is non-diagnostic for ischemia as it a pharmacologic stress using Lexiscan. Stress  symptoms included dyspnea. 2. Myocardial perfusion imaging is normal. Overall left ventricular systolic function was normal without regional wall motion abnormalities. The left ventricular ejection fraction was 70%. No significant change from 12/19/12.  Carotid Doppler [12/08/2016]: No hemodynamically significant arterial disease in the internal carotid artery bilaterally. There is mixed plaque bilateral bulb. Antegrade right vertebral artery flow. Antegrade left vertebral artery flow.  Coronary Angiogram [12/09/2015]: Right Heart Cath;Mild to moderate pulmonary hypertension. RA 11/10/8, RV 52/5, EDP 11, PA 54/20, mean 34 mmHg. PA  saturation 58%. PW 19/25, mean 17 mmHg. Post 600 g nitroglycerin: PA pressure 40/9, mean 23 mmHg. PW 11/11, mean 8 mmHg. CO 5.76, CI 2.77. QP/QS 0.84.  Echocardiogram 11/02/2018: Normal LV systolic function with EF 61%. Left ventricle cavity is normal in size. Normal left ventricular wall thickness. Normal global wall motion. Doppler evidence of grade I (impaired) diastolic dysfunction, normal LAP.  Trileaflet aortic valve with mild aortic valve leaflet calcification. Mild aortic valve stenosis. Aortic valve mean gradient of 11 mmHg, Vmax of 2.3  m/s. Calculated aortic valve area by continuity equation is 1.3 cm. Mild (Grade I) aortic regurgitation. Mild to moderate mitral regurgitation. Moderate tricuspid regurgitation. Estimated pulmonary artery systolic pressure is 46 mmHg.  Compared to previous study on 10/25/2016, tricuspid regurgitation is increased in severity. Pulmonary hypertension is new.  EKG:   EKG 10/12/2019: Sinus rhythm with first-degree block at rate of 78 bpm, left axis deviation, left anterior fascicular block.  Incomplete right bundle branch block.  Poor R wave progression, cannot exclude anterolateral infarct old.  LVH.    EKG 05/01/2019: Sinus rhythm with first-degree block at rate of 89 bpm, left atrial abnormality, left axis deviation, left  anterior fascicular block.  Poor R wave progression, cannot exclude anteroseptal infarct old.  LVH with repolarization abnormality, cannot exclude high lateral ischemia.   Assessment     ICD-10-CM   1. Chronic diastolic CHF (congestive heart failure) (HCC)  I50.32 PCV ECHOCARDIOGRAM COMPLETE    Vitamin B12    Folate    Iron and TIBC    Ferritin  2. Shortness of breath  R06.02 EKG 12-Lead    PCV ECHOCARDIOGRAM COMPLETE  3. Moderate mitral regurgitation  I34.0 PCV ECHOCARDIOGRAM COMPLETE  4. Essential hypertension  I10   5. Anemia, unspecified type  D64.9 CBC    Vitamin B12    Folate    Iron and TIBC    Ferritin  6. Other folate deficiency anemias   D52.8 Vitamin B12  7. Malaise and fatigue  R53.81    R53.83     Recommendations:   ANDERSYN FRAGOSO  is a 86 y.o. female  aortic stenosis, Moderate mitral regurgitation, hypertension, diabetes mellitus, morbid obesity, and sleep apnea. She is here for follow-up of aortic stenosis, hypertension, and shortness of breath. She reports no symptoms of shortness of breath, orthopnea, chest pain, light-headedness, or edema. She wears supplemental oxygen at night for sleep apnea as she cannot tolerate CPAP.  This is a six-month office visit, I suspect her dyspnea may be related to exacerbation of chronic diastolic heart failure, but also could be related to new onset anemia.  I will obtain an echocardiogram to reevaluate her aortic stenosis and LV diastolic function in the interim we will go ahead and obtain anemia panel and make sure I send a copy to her PCP as well.  Do not feel she has acute decompensated heart failure.  There is no leg edema except for trace ankle edema today, lungs are clear.  I would like to see her back in 6 weeks for follow-up.  Her fatigue may also be related to obesity, generalized deconditioning, advanced age and also sleep apnea and unable to tolerate CPAP.  I will see her back in the office in 6 weeks.  I reviewed her  external labs, lipids are well controlled, renal function is stable, anemia is new.  Adrian Prows, MD, [facility] 10/14/2019, 4:47 PM [facility] [place] Pager: [phone] Office: [phone]

## 2019-10-18 ENCOUNTER — Other Ambulatory Visit: Payer: Self-pay | Admitting: Cardiology

## 2019-10-18 LAB — CBC
Hematocrit: 35.2 % (ref 34.0–46.6)
Hemoglobin: 11.5 g/dL (ref 11.1–15.9)
MCH: 30.3 pg (ref 26.6–33.0)
MCHC: 32.7 g/dL (ref 31.5–35.7)
MCV: 93 fL (ref 79–97)
RBC: 3.8 x10E6/uL (ref 3.77–5.28)
RDW: 12.5 % (ref 11.7–15.4)
WBC: 7.1 10*3/uL (ref 3.4–10.8)

## 2019-10-18 LAB — IRON AND TIBC
Iron Saturation: 18 % (ref 15–55)
Iron: 49 ug/dL (ref 27–139)
Total Iron Binding Capacity: 271 ug/dL (ref 250–450)
UIBC: 222 ug/dL (ref 118–369)

## 2019-10-18 LAB — VITAMIN B12: Vitamin B-12: 364 pg/mL (ref 232–1245)

## 2019-10-18 LAB — FOLATE: Folate: 10.4 ng/mL (ref >3.0)

## 2019-10-18 LAB — FERRITIN: Ferritin: 45 ng/mL (ref 15–150)

## 2019-10-19 ENCOUNTER — Ambulatory Visit: Payer: Medicare Other

## 2019-10-19 ENCOUNTER — Other Ambulatory Visit: Payer: Self-pay

## 2019-10-19 DIAGNOSIS — I5032 Chronic diastolic (congestive) heart failure: Secondary | ICD-10-CM

## 2019-10-19 DIAGNOSIS — R0602 Shortness of breath: Secondary | ICD-10-CM

## 2019-10-19 DIAGNOSIS — I340 Nonrheumatic mitral (valve) insufficiency: Secondary | ICD-10-CM

## 2019-10-20 NOTE — Progress Notes (Signed)
Normal labs, CBC, anemia panel faxed to pcp. D/W her husband

## 2019-10-24 NOTE — Progress Notes (Signed)
Spoke with patient regarding results patient understanding

## 2019-10-27 ENCOUNTER — Ambulatory Visit: Payer: TRICARE For Life (TFL) | Admitting: Cardiology

## 2019-11-06 DIAGNOSIS — E119 Type 2 diabetes mellitus without complications: Secondary | ICD-10-CM | POA: Diagnosis not present

## 2019-11-06 DIAGNOSIS — I10 Essential (primary) hypertension: Secondary | ICD-10-CM | POA: Diagnosis not present

## 2019-11-06 DIAGNOSIS — E1165 Type 2 diabetes mellitus with hyperglycemia: Secondary | ICD-10-CM | POA: Diagnosis not present

## 2019-11-14 DIAGNOSIS — I272 Pulmonary hypertension, unspecified: Secondary | ICD-10-CM | POA: Diagnosis not present

## 2019-11-14 DIAGNOSIS — E1140 Type 2 diabetes mellitus with diabetic neuropathy, unspecified: Secondary | ICD-10-CM | POA: Diagnosis not present

## 2019-11-14 DIAGNOSIS — I350 Nonrheumatic aortic (valve) stenosis: Secondary | ICD-10-CM | POA: Diagnosis not present

## 2019-11-14 DIAGNOSIS — M1990 Unspecified osteoarthritis, unspecified site: Secondary | ICD-10-CM | POA: Diagnosis not present

## 2019-11-14 DIAGNOSIS — E1122 Type 2 diabetes mellitus with diabetic chronic kidney disease: Secondary | ICD-10-CM | POA: Diagnosis not present

## 2019-11-14 DIAGNOSIS — I5030 Unspecified diastolic (congestive) heart failure: Secondary | ICD-10-CM | POA: Diagnosis not present

## 2019-11-23 ENCOUNTER — Encounter: Payer: Self-pay | Admitting: Cardiology

## 2019-11-23 ENCOUNTER — Other Ambulatory Visit: Payer: Self-pay

## 2019-11-23 ENCOUNTER — Ambulatory Visit: Payer: Medicare Other | Admitting: Cardiology

## 2019-11-23 VITALS — BP 144/72 | HR 94 | Resp 17 | Ht 64.0 in | Wt 225.0 lb

## 2019-11-23 DIAGNOSIS — I10 Essential (primary) hypertension: Secondary | ICD-10-CM | POA: Diagnosis not present

## 2019-11-23 DIAGNOSIS — I5032 Chronic diastolic (congestive) heart failure: Secondary | ICD-10-CM | POA: Diagnosis not present

## 2019-11-23 DIAGNOSIS — R5382 Chronic fatigue, unspecified: Secondary | ICD-10-CM

## 2019-11-23 DIAGNOSIS — R0602 Shortness of breath: Secondary | ICD-10-CM | POA: Diagnosis not present

## 2019-11-23 DIAGNOSIS — D513 Other dietary vitamin B12 deficiency anemia: Secondary | ICD-10-CM | POA: Diagnosis not present

## 2019-11-23 DIAGNOSIS — R5381 Other malaise: Secondary | ICD-10-CM

## 2019-11-23 MED ORDER — AMLODIPINE BESYLATE 5 MG PO TABS: 2.5000 mg | ORAL_TABLET | Freq: Every day | ORAL | 3 refills | Status: DC

## 2019-11-23 MED ORDER — VITAMIN B-12 1000 MCG PO TABS: 1000.0000 ug | ORAL_TABLET | Freq: Every day | ORAL | 3 refills | Status: DC

## 2019-11-23 NOTE — Progress Notes (Signed)
Primary Physician/Referring:  [name], MD  Patient ID: [name], female    DOB: [birth year], 86 y.o.   MRN: [id number]  Chief Complaint  Patient presents with  . Shortness of Breath  . Anemia  . Fatigue  . Congestive Heart Failure  . Follow-up    6 week   HPI:    HPI: [name]  is a 86 y.o. female  aortic stenosis, hypertension, diabetes mellitus, morbid obesity, and sleep apnea. She is here for follow-up of aortic stenosis, hypertension, and shortness of breath. She reports no symptoms of shortness of breath, orthopnea, chest pain, light-headedness, or edema. She wears supplemental oxygen at night for sleep apnea as she cannot tolerate CPAP.   She now presents for 6 week visit, on her last office visit due to fatigue, I obtained labs including iron studies for anemia.  She has been using nocturnal oxygen on a daily basis.  She does feel refreshed using this.  Except for chronic dyspnea and fatigue no other specific symptoms today.  Past Medical History:  Diagnosis Date  . Cellulitis and abscess of left leg   . CHF (congestive heart failure) ([place])   . COPD (chronic obstructive pulmonary disease) ([place])   . Diabetes mellitus   . Glaucoma   . Hyperlipidemia   . Hypertension    Past Surgical History:  Procedure Laterality Date  . BACK SURGERY    . CARDIAC CATHETERIZATION N/A 12/09/2015   Procedure: Right Heart Cath;  Surgeon: Adrian Prows, MD;  Location: [place] [facility];  Service: Cardiovascular;  Laterality: N/A;  . CHOLECYSTECTOMY  2000  . HERNIA REPAIR  2007  . rupture disk  1970"s  . SHOULDER ARTHROSCOPY WITH ROTATOR CUFF REPAIR  1999   rt shoulder   Social History   Tobacco Use  . Smoking status: Never Smoker  . Smokeless tobacco: Never Used  Substance Use Topics  . Alcohol use: No   Marital Status: Married   ROS  Review of Systems  Constitutional: Positive for malaise/fatigue.  Cardiovascular: Positive for dyspnea on exertion. Negative for chest  pain and leg swelling.  Gastrointestinal: Negative for melena.   Objective  Blood pressure (!) 144/72, pulse 94, resp. rate 17, height 5\' 4"  (1.626 m), weight (!) 225 lb (102.1 kg), SpO2 99 %. Body mass index is 38.62 kg/m.   Vitals with BMI 11/23/2019 10/12/2019 05/01/2019  Height 5\' 4"  5\' 4"  5\' 4"   Weight 225 lbs 220 lbs 6 oz 226 lbs  BMI 38.6 89.37 34.28  Systolic 768 115 726  Diastolic 72 72 81  Pulse 94 77 86   Physical Exam Constitutional:      Comments: Morbidly obese  Neck:     Thyroid: No thyromegaly.     Comments: Short neck and difficult to evaluate [facility]:     Rate and Rhythm: Normal rate and regular rhythm.     Pulses: Normal pulses and intact distal pulses.          Carotid pulses are 2+ on the right side with bruit and 2+ on the left side with bruit.      Dorsalis pedis pulses are 2+ on the right side and 2+ on the left side.       Posterior tibial pulses are 2+ on the right side and 2+ on the left side.     Heart sounds: Murmur heard. High-pitched harsh midsystolic murmur is present with a grade of 2/6 at the upper right sternal border.  Blowing holosystolic murmur of grade 3/6 is also present at the apex.  No gallop.      Comments: Femoral and popliteal pulse difficult to feel due to patient's body habitus.  2 plus pitting bilateral below knee let edema.  Pulmonary:     Breath sounds: Normal breath sounds.  Abdominal:     General: Bowel sounds are normal.     Palpations: Abdomen is soft.     Comments: Obese. Pannus present    Radiology: No results found.  Laboratory examination:   Labs 10/17/2019:  Ref Range & Units 10/17/2019  Vitamin B-12 232 - 1,245 pg/mL 364         Ref Range & Units 1 mo ago  Folate >3.0 ng/mL 10.4     Ref Range & Units 10/17/2019 3 yr ago  Total Iron Binding Capacity 250 - 450 ug/dL 271  311   UIBC 118 - 369 ug/dL 222  246 R   Iron 27 - 139 ug/dL 49  65 R   Iron Saturation 15 - 55 % 18      Ref Range & Units  10/17/2019 3 yr ago  Ferritin 15.0 - 150.0 ng/mL 45  72 R          CMP Latest Ref Rng & Units 02/28/2019 02/27/2019 02/26/2019  Glucose 70 - 99 mg/dL 110(H) 109(H) 136(H)  BUN 8 - 23 mg/dL 23 21 21   Creatinine 0.44 - 1.00 mg/dL 1.10(H) 1.18(H) 1.31(H)  Sodium 135 - 145 mmol/L 136 137 138  Potassium 3.5 - 5.1 mmol/L 4.7 4.9 4.2  Chloride 98 - 111 mmol/L 99 103 102  CO2 22 - 32 mmol/L 25 26 26   Calcium 8.9 - 10.3 mg/dL 9.0 8.6(L) 9.3  Total Protein 6.5 - 8.1 g/dL 5.5(L) - 6.2(L)  Total Bilirubin 0.3 - 1.2 mg/dL 0.5 - 0.7  Alkaline Phos 38 - 126 U/L 62 - 80  AST 15 - 41 U/L 12(L) - 17  ALT 0 - 44 U/L 8 - 11   CBC Latest Ref Rng & Units 10/17/2019 02/28/2019 02/26/2019  WBC 3.4 - 10.8 x10E3/uL 7.1 7.5 10.8(H)  Hemoglobin 11.1 - 15.9 g/dL 11.5 10.2(L) 11.7(L)  Hematocrit 34.0 - 46.6 % 35.2 33.3(L) 36.2  Platelets x10E3/uL CANCELED PLATELET CLUMPS NOTED ON SMEAR, UNABLE TO ESTIMATE PLATELET CLUMPS NOTED ON SMEAR, UNABLE TO ESTIMATE   External Labs:  Cholesterol, total 142.000 M 05/09/2019 HDL 44.000 M 05/09/2019 LDL-C 88.000 M 05/09/2018 Triglycerides 100.000 08/07/2019  A1C 6.200 % 05/09/2018 TSH 3.040 05/09/2019  Hemoglobin 10.200 02/28/2019   Creatinine, Serum 1.170 MG/ 05/09/2019 Potassium 4.700 02/28/2019 ALT (SGPT) 11.000 IU/ 05/09/2019  Medications   Current Outpatient Medications  Medication Instructions  . acetaminophen (TYLENOL) 650 mg, Oral, Every 8 hours PRN  . amLODipine (NORVASC) 2.5 mg, Oral, Daily  . aspirin EC 81 mg, Oral, Daily  . Brinzolamide-Brimonidine (SIMBRINZA) 1-0.2 % SUSP 1 drop, Ophthalmic, Daily after supper  . collagenase (SANTYL) ointment Topical, Daily  . isosorbide mononitrate (IMDUR) 60 MG 24 hr tablet TAKE 1 TABLET DAILY  . Liraglutide (VICTOZA [place]) 0.6 mg, Subcutaneous, Daily  . metFORMIN (GLUCOPHAGE) 500 mg, Oral, Daily  . telmisartan (MICARDIS) 80 mg, Oral, Daily  . vitamin B-12 (CYANOCOBALAMIN) 1,000 mcg, Oral, Daily    Cardiac Studies:    Nuclear stress test [05/30/2015]:  1. The resting electrocardiogram demonstrated normal sinus rhythm, normal resting conduction and no resting arrhythmias. Stress EKG is non-diagnostic for ischemia as it a pharmacologic stress using Lexiscan. Stress symptoms included  dyspnea. 2. Myocardial perfusion imaging is normal. Overall left ventricular systolic function was normal without regional wall motion abnormalities. The left ventricular ejection fraction was 70%. No significant change from 12/19/12.  Carotid Doppler [12/08/2016]: No hemodynamically significant arterial disease in the internal carotid artery bilaterally. There is mixed plaque bilateral bulb. Antegrade right vertebral artery flow. Antegrade left vertebral artery flow.  Coronary Angiogram [12/09/2015]: Right Heart Cath;Mild to moderate pulmonary hypertension. RA 11/10/8, RV 52/5, EDP 11, PA 54/20, mean 34 mmHg. PA saturation 58%. PW 19/25, mean 17 mmHg. Post 600 g nitroglycerin: PA pressure 40/9, mean 23 mmHg. PW 11/11, mean 8 mmHg. CO 5.76, CI 2.77. QP/QS 0.84.  Echocardiogram 10/19/2019:  Normal LV systolic function with visual EF 55-60%. Left ventricle cavity is normal in size. Normal global wall motion. Doppler evidence of grade II diastolic dysfunction, elevated LAP.  Left atrial cavity is mildly dilated.  Mild (Grade I) aortic regurgitation.  Aortic sclerosis without stenosis.  Mild (Grade I) mitral regurgitation.  Mild tricuspid regurgitation. Mild pulmonary hypertension. RVSP measures 44 mmHg.  Compared to prior study 08/28/2704: diastolic dysfunction is now grade II with increased LAP, MR and TR have improved.   EKG:   EKG 10/12/2019: Sinus rhythm with first-degree block at rate of 78 bpm, left axis deviation, left anterior fascicular block.  Incomplete right bundle branch block.  Poor R wave progression, cannot exclude anterolateral infarct old.  LVH.    EKG 05/01/2019: Sinus rhythm with first-degree block at rate of  89 bpm, left atrial abnormality, left axis deviation, left anterior fascicular block.  Poor R wave progression, cannot exclude anteroseptal infarct old.  LVH with repolarization abnormality, cannot exclude high lateral ischemia.   Assessment     ICD-10-CM   1. Chronic diastolic CHF (congestive heart failure) (HCC)  I50.32 Pulmonary rehab therapeutic exercise  2. Shortness of breath  R06.02 Pulmonary rehab therapeutic exercise  3. Essential hypertension  I10 amLODipine (NORVASC) 5 MG tablet  4. Other dietary vitamin B12 deficiency anemia  D51.3 vitamin B-12 (CYANOCOBALAMIN) 1000 MCG tablet  5. Chronic fatigue and malaise  R53.82 vitamin B-12 (CYANOCOBALAMIN) 1000 MCG tablet   R53.81     Recommendations:   [name]  is a 86 y.o. female  aortic stenosis, Moderate mitral regurgitation, hypertension, diabetes mellitus, morbid obesity, and sleep apnea. She is here for follow-up of aortic stenosis, hypertension, and shortness of breath. She reports no symptoms of shortness of breath, orthopnea, chest pain, light-headedness, or edema. She wears supplemental oxygen at night for sleep apnea as she cannot tolerate CPAP.  I reviewed the results of the echocardiogram, suspect deconditioning to be the etiology for her marked dyspnea along with grade 2 diastolic dysfunction and chronic diastolic heart failure.  I will refer him for pulmonary rehab.  Reviewed her labs, labs are within normal limits.  B12 levels in the lower limit of normal, advised her to start B12 supplement 1000 mcg daily.  Blood pressure is elevated today, I will add amlodipine 5 mg daily, discussed leg edema as complication.  She does have underlying leg edema but this is remained stable.  I will see her back in 6 months for follow-up.   Adrian Prows, MD, [facility] 11/23/2019, 9:25 PM Office: [phone]

## 2019-12-01 ENCOUNTER — Telehealth: Payer: Self-pay

## 2019-12-01 NOTE — Telephone Encounter (Signed)
Spoke with patient husband. Patient was confused about what dose of Amlodipine to take. I explained that in last office note he increase it to 5mg  daily. Husband said that she was sent Amlodipine 2.5mg . I instructed husband that she could take two of the 2.5mg  daily. He verbalized understanding.

## 2019-12-04 DIAGNOSIS — H401131 Primary open-angle glaucoma, bilateral, mild stage: Secondary | ICD-10-CM | POA: Diagnosis not present

## 2019-12-06 ENCOUNTER — Encounter (HOSPITAL_COMMUNITY): Payer: Self-pay | Admitting: Emergency Medicine

## 2019-12-06 ENCOUNTER — Ambulatory Visit (HOSPITAL_COMMUNITY)
Admission: EM | Admit: 2019-12-06 | Discharge: 2019-12-06 | Disposition: A | Discharge status: Home / Self Care | Payer: Medicare Other

## 2019-12-06 ENCOUNTER — Emergency Department (HOSPITAL_COMMUNITY): Payer: Medicare Other

## 2019-12-06 ENCOUNTER — Inpatient Hospital Stay (HOSPITAL_COMMUNITY)
Admission: EM | Admit: 2019-12-06 | Discharge: 2019-12-09 | DRG: 193 | Disposition: A | Discharge status: Home / Self Care | Payer: Medicare Other | Attending: Internal Medicine | Admitting: Internal Medicine

## 2019-12-06 ENCOUNTER — Other Ambulatory Visit: Payer: Self-pay

## 2019-12-06 ENCOUNTER — Encounter (HOSPITAL_COMMUNITY): Payer: Self-pay

## 2019-12-06 DIAGNOSIS — I130 Hypertensive heart and chronic kidney disease with heart failure and stage 1 through stage 4 chronic kidney disease, or unspecified chronic kidney disease: Secondary | ICD-10-CM | POA: Diagnosis present

## 2019-12-06 DIAGNOSIS — Z7984 Long term (current) use of oral hypoglycemic drugs: Secondary | ICD-10-CM

## 2019-12-06 DIAGNOSIS — N183 Chronic kidney disease, stage 3 unspecified: Secondary | ICD-10-CM | POA: Diagnosis present

## 2019-12-06 DIAGNOSIS — I10 Essential (primary) hypertension: Secondary | ICD-10-CM | POA: Diagnosis present

## 2019-12-06 DIAGNOSIS — J9811 Atelectasis: Secondary | ICD-10-CM | POA: Diagnosis not present

## 2019-12-06 DIAGNOSIS — J9621 Acute and chronic respiratory failure with hypoxia: Secondary | ICD-10-CM | POA: Diagnosis present

## 2019-12-06 DIAGNOSIS — R0602 Shortness of breath: Secondary | ICD-10-CM | POA: Diagnosis not present

## 2019-12-06 DIAGNOSIS — Z7982 Long term (current) use of aspirin: Secondary | ICD-10-CM

## 2019-12-06 DIAGNOSIS — J441 Chronic obstructive pulmonary disease with (acute) exacerbation: Secondary | ICD-10-CM | POA: Diagnosis not present

## 2019-12-06 DIAGNOSIS — I272 Pulmonary hypertension, unspecified: Secondary | ICD-10-CM | POA: Diagnosis present

## 2019-12-06 DIAGNOSIS — Z20822 Contact with and (suspected) exposure to covid-19: Secondary | ICD-10-CM | POA: Diagnosis not present

## 2019-12-06 DIAGNOSIS — H409 Unspecified glaucoma: Secondary | ICD-10-CM | POA: Diagnosis present

## 2019-12-06 DIAGNOSIS — I5032 Chronic diastolic (congestive) heart failure: Secondary | ICD-10-CM | POA: Diagnosis not present

## 2019-12-06 DIAGNOSIS — J9601 Acute respiratory failure with hypoxia: Secondary | ICD-10-CM

## 2019-12-06 DIAGNOSIS — R918 Other nonspecific abnormal finding of lung field: Secondary | ICD-10-CM | POA: Diagnosis not present

## 2019-12-06 DIAGNOSIS — J180 Bronchopneumonia, unspecified organism: Secondary | ICD-10-CM | POA: Diagnosis not present

## 2019-12-06 DIAGNOSIS — Z6838 Body mass index (BMI) 38.0-38.9, adult: Secondary | ICD-10-CM

## 2019-12-06 DIAGNOSIS — E669 Obesity, unspecified: Secondary | ICD-10-CM | POA: Diagnosis present

## 2019-12-06 DIAGNOSIS — E119 Type 2 diabetes mellitus without complications: Secondary | ICD-10-CM

## 2019-12-06 DIAGNOSIS — R0902 Hypoxemia: Secondary | ICD-10-CM

## 2019-12-06 DIAGNOSIS — E785 Hyperlipidemia, unspecified: Secondary | ICD-10-CM | POA: Diagnosis present

## 2019-12-06 DIAGNOSIS — I2510 Atherosclerotic heart disease of native coronary artery without angina pectoris: Secondary | ICD-10-CM | POA: Diagnosis not present

## 2019-12-06 DIAGNOSIS — N1831 Chronic kidney disease, stage 3a: Secondary | ICD-10-CM | POA: Diagnosis present

## 2019-12-06 DIAGNOSIS — J449 Chronic obstructive pulmonary disease, unspecified: Secondary | ICD-10-CM

## 2019-12-06 DIAGNOSIS — Z823 Family history of stroke: Secondary | ICD-10-CM

## 2019-12-06 DIAGNOSIS — J189 Pneumonia, unspecified organism: Secondary | ICD-10-CM

## 2019-12-06 DIAGNOSIS — I700 Atherosclerosis of aorta: Secondary | ICD-10-CM | POA: Diagnosis not present

## 2019-12-06 DIAGNOSIS — E871 Hypo-osmolality and hyponatremia: Secondary | ICD-10-CM | POA: Diagnosis present

## 2019-12-06 DIAGNOSIS — Z881 Allergy status to other antibiotic agents status: Secondary | ICD-10-CM

## 2019-12-06 DIAGNOSIS — Z79899 Other long term (current) drug therapy: Secondary | ICD-10-CM

## 2019-12-06 DIAGNOSIS — E1122 Type 2 diabetes mellitus with diabetic chronic kidney disease: Secondary | ICD-10-CM | POA: Diagnosis present

## 2019-12-06 LAB — CBC
HCT: 35.6 % — ABNORMAL LOW (ref 36.0–46.0)
Hemoglobin: 10.8 g/dL — ABNORMAL LOW (ref 12.0–15.0)
MCH: 29.1 pg (ref 26.0–34.0)
MCHC: 30.3 g/dL (ref 30.0–36.0)
MCV: 96 fL (ref 80.0–100.0)
Platelets: ADEQUATE 10*3/uL (ref 150–400)
RBC: 3.71 MIL/uL — ABNORMAL LOW (ref 3.87–5.11)
RDW: 13.3 % (ref 11.5–15.5)
WBC: 6.1 10*3/uL (ref 4.0–10.5)
nRBC: 0 % (ref 0.0–0.2)

## 2019-12-06 LAB — BASIC METABOLIC PANEL
Anion gap: 10 (ref 5–15)
BUN: 19 mg/dL (ref 8–23)
CO2: 28 mmol/L (ref 22–32)
Calcium: 9.1 mg/dL (ref 8.9–10.3)
Chloride: 97 mmol/L — ABNORMAL LOW (ref 98–111)
Creatinine, Ser: 1.02 mg/dL — ABNORMAL HIGH (ref 0.44–1.00)
GFR calc Af Amer: 57 mL/min — ABNORMAL LOW (ref >60)
GFR calc non Af Amer: 49 mL/min — ABNORMAL LOW (ref >60)
Glucose, Bld: 170 mg/dL — ABNORMAL HIGH (ref 70–99)
Potassium: 4.5 mmol/L (ref 3.5–5.1)
Sodium: 135 mmol/L (ref 135–145)

## 2019-12-06 LAB — BRAIN NATRIURETIC PEPTIDE: B Natriuretic Peptide: 126.5 pg/mL — ABNORMAL HIGH (ref 0.0–100.0)

## 2019-12-06 LAB — CBG MONITORING, ED: Glucose-Capillary: 140 mg/dL — ABNORMAL HIGH (ref 70–99)

## 2019-12-06 LAB — TROPONIN I (HIGH SENSITIVITY): Troponin I (High Sensitivity): 7 ng/L (ref <18)

## 2019-12-06 LAB — SARS CORONAVIRUS 2 BY RT PCR (HOSPITAL ORDER, PERFORMED IN ~~LOC~~ HOSPITAL LAB): SARS Coronavirus 2: NEGATIVE

## 2019-12-06 MED ORDER — IOHEXOL 350 MG/ML SOLN
80.0000 mL | Freq: Once | INTRAVENOUS | Status: AC | PRN
  Administered 2019-12-06: 80 mL via INTRAVENOUS

## 2019-12-06 MED ORDER — IPRATROPIUM-ALBUTEROL 0.5-2.5 (3) MG/3ML IN SOLN
3.0000 mL | Freq: Once | RESPIRATORY_TRACT | Status: AC
  Administered 2019-12-06: 3 mL via RESPIRATORY_TRACT
  Filled 2019-12-06: qty 3

## 2019-12-06 MED ORDER — ACETAMINOPHEN 325 MG PO TABS
650.0000 mg | ORAL_TABLET | Freq: Once | ORAL | Status: AC
  Administered 2019-12-06: 650 mg via ORAL
  Filled 2019-12-06: qty 2

## 2019-12-06 NOTE — ED Notes (Signed)
Patient is being discharged from the Urgent Care and sent to the Emergency Department via personal vehicle with daughter . Per provider Jaynee Eagles, patient is in need of higher level of care due to shortness of breath & unstable vitals. Patient is aware and verbalizes understanding of plan of care.   Vitals:   12/06/19 1052  BP: (!) 150/76  Pulse: 90  Resp: 18  Temp: 99.2 F (37.3 C)  SpO2: (!) 83%

## 2019-12-06 NOTE — Discharge Instructions (Signed)
Please report to the emergency room now for management and work up of acute respiratory failure given her shortness of breath, pulse oximetry of 83-85% in the setting of COPD and chronic heart failure.

## 2019-12-06 NOTE — ED Provider Notes (Signed)
[facility] EMERGENCY DEPARTMENT Provider Note   CSN: [id number] Arrival date & time: 12/06/19  1127     History Chief Complaint  Patient presents with   Shortness of Breath    [name] is a 86 y.o. female.  Patient with history of heart failure, COPD who presents to the ED with shortness of breath.  Sent from urgent care for hypoxia.  States that she wears oxygen at night for her heart failure.  Denies smoking history.  States that she does not use any inhalers.  She has had progressive shortness of breath over the last several days.  Making it harder to walk without severe shortness of breath.  She has had a Covid vaccination.  She denies any chest pain.  She feels like she has had sputum.  She has had leg swelling as well.  The history is provided by the patient.  Shortness of Breath Severity:  Moderate Onset quality:  Gradual Timing:  Constant Progression:  Worsening Chronicity:  New Relieved by:  Oxygen Worsened by:  Activity Associated symptoms: cough and sputum production   Associated symptoms: no abdominal pain, no chest pain, no ear pain, no fever, no rash, no sore throat and no vomiting   Risk factors: no hx of PE/DVT        Past Medical History:  Diagnosis Date   Cellulitis and abscess of left leg    CHF (congestive heart failure) (HCC)    COPD (chronic obstructive pulmonary disease) ([place])    Diabetes mellitus    Glaucoma    Hyperlipidemia    Hypertension     Patient Active Problem List   Diagnosis Date Noted   Cellulitis 02/26/2019   Essential hypertension 11/03/2017   Sepsis ([place]) 11/03/2017   Type II diabetes mellitus with renal manifestations ([place]) 11/03/2017   Cellulitis of right leg 11/03/2017   Flank pain 11/03/2017   Chronic diastolic CHF (congestive heart failure) ([place]) 03/19/2017   Diabetes mellitus ([place]) 03/19/2017   Obesity, diabetes, and hypertension syndrome ([place]) 03/19/2017   CKD (chronic  kidney disease) stage 3, GFR 30-59 ml/min 03/19/2017   Syncope 10/24/2016   Pulmonary hypertension ([place]) 12/07/2015   Varicose veins of leg with complications 29/56/2130   Varicose veins of left lower extremity with complications 86/57/8469   Varicose veins of lower extremities with other complications 62/95/2841   Lipoma of back 07/19/2012    Past Surgical History:  Procedure Laterality Date   BACK SURGERY     CARDIAC CATHETERIZATION N/A 12/09/2015   Procedure: Right Heart Cath;  Surgeon: Adrian Prows, MD;  Location: [facility];  Service: Cardiovascular;  Laterality: N/A;   CHOLECYSTECTOMY  2000   HERNIA REPAIR  2007   rupture disk  1970"s   SHOULDER ARTHROSCOPY WITH ROTATOR CUFF REPAIR  1999   rt shoulder     OB History   No obstetric history on file.     Family History  Problem Relation Age of Onset   Stroke Mother     Social History   Tobacco Use   Smoking status: Never Smoker   Smokeless tobacco: Never Used  Vaping Use   Vaping Use: Never used  Substance Use Topics   Alcohol use: No   Drug use: No    Home Medications Prior to Admission medications   Medication Sig Start Date End Date Taking? Authorizing Provider  acetaminophen (TYLENOL) 650 MG CR tablet Take 650 mg by mouth every 8 (eight) hours as needed for  pain.    [provider]  amLODipine (NORVASC) 5 MG tablet Take 0.5 tablets (2.5 mg total) by mouth daily. 11/23/19   Adrian Prows, MD  aspirin EC 81 MG tablet Take 81 mg by mouth daily.    [provider]  Brinzolamide-Brimonidine (SIMBRINZA) 1-0.2 % SUSP Apply 1 drop to eye daily after supper.    [provider]  collagenase (SANTYL) ointment Apply topically daily. 02/28/19   Domenic Polite, MD  isosorbide mononitrate (IMDUR) 60 MG 24 hr tablet TAKE 1 TABLET DAILY 10/18/19   Adrian Prows, MD  Liraglutide (VICTOZA [place]) Inject 0.6 mg into the skin daily.     [provider]  metFORMIN (GLUCOPHAGE) 1000  MG tablet Take 0.5 tablets (500 mg total) by mouth daily. 10/27/16   [name], MD  telmisartan (MICARDIS) 80 MG tablet Take 80 mg by mouth daily.    [provider]  vitamin B-12 (CYANOCOBALAMIN) 1000 MCG tablet Take 1 tablet (1,000 mcg total) by mouth daily. 11/23/19   Adrian Prows, MD    Allergies    Cephalexin, Invokana [canagliflozin], Lipitor [atorvastatin calcium], and Tramadol  Review of Systems   Review of Systems  Constitutional: Negative for chills and fever.  HENT: Negative for ear pain and sore throat.   Eyes: Negative for pain and visual disturbance.  Respiratory: Positive for cough, sputum production and shortness of breath.   Cardiovascular: Positive for leg swelling. Negative for chest pain and palpitations.  Gastrointestinal: Negative for abdominal pain and vomiting.  Genitourinary: Negative for dysuria and hematuria.  Musculoskeletal: Negative for arthralgias and back pain.  Skin: Negative for color change and rash.  Neurological: Negative for seizures and syncope.  All other systems reviewed and are negative.   Physical Exam Updated Vital Signs  ED Triage Vitals  Enc Vitals Group     BP 12/06/19 1140 (!) 123/53     Pulse Rate 12/06/19 1131 97     Resp 12/06/19 1138 (!) 22     Temp 12/06/19 1138 99.6 F (37.6 C)     Temp Source 12/06/19 1138 Oral     SpO2 12/06/19 1131 91 %     Weight 12/06/19 1154 225 lb (102.1 kg)     Height 12/06/19 1154 5\' 4"  (1.626 m)     Head Circumference --      Peak Flow --      Pain Score 12/06/19 1153 7     Pain Loc --      Pain Edu? --      Excl. in [place]? --     Physical Exam Vitals and nursing note reviewed.  Constitutional:      General: She is not in acute distress.    Appearance: She is well-developed. She is not ill-appearing.  HENT:     Head: Normocephalic and atraumatic.  Eyes:     Conjunctiva/sclera: Conjunctivae normal.     Pupils: Pupils are equal, round, and reactive to light.  Cardiovascular:      Rate and Rhythm: Normal rate and regular rhythm.     Heart sounds: No murmur heard.   Pulmonary:     Effort: Pulmonary effort is normal. Tachypnea present. No respiratory distress.     Breath sounds: Decreased breath sounds, wheezing and rhonchi present.  Abdominal:     Palpations: Abdomen is soft.     Tenderness: There is no abdominal tenderness.  Musculoskeletal:     Cervical back: Normal range of motion and neck supple.  Right lower leg: Edema (1+ pitting) present.     Left lower leg: Edema (1+ pitting) present.  Skin:    General: Skin is warm and dry.  Neurological:     Mental Status: She is alert.     ED Results / Procedures / Treatments   Labs (all labs ordered are listed, but only abnormal results are displayed) Labs Reviewed  BASIC METABOLIC PANEL - Abnormal; Notable for the following components:      Result Value   Chloride 97 (*)    Glucose, Bld 170 (*)    Creatinine, Ser 1.02 (*)    GFR calc non Af Amer 49 (*)    GFR calc Af Amer 57 (*)    All other components within normal limits  CBC - Abnormal; Notable for the following components:   RBC 3.71 (*)    Hemoglobin 10.8 (*)    HCT 35.6 (*)    All other components within normal limits  BRAIN NATRIURETIC PEPTIDE - Abnormal; Notable for the following components:   B Natriuretic Peptide 126.5 (*)    All other components within normal limits  CBG MONITORING, ED - Abnormal; Notable for the following components:   Glucose-Capillary 140 (*)    All other components within normal limits  SARS CORONAVIRUS 2 BY RT PCR (HOSPITAL ORDER, [facility])  TROPONIN I (HIGH SENSITIVITY)    EKG EKG Interpretation  Date/Time:  Wednesday December 06 2019 11:43:58 EDT Ventricular Rate:  83 PR Interval:  262 QRS Duration: 74 QT Interval:  376 QTC Calculation: 441 R Axis:   -51 Text Interpretation: Sinus rhythm with 1st degree A-V block Left axis deviation Minimal voltage criteria for LVH, may be  normal variant ( R in aVL ) Abnormal ECG Confirmed by Lennice Sites [phone]) on 12/06/2019 8:54:21 PM   Radiology DG Chest Portable 1 View  Result Date: 12/06/2019 CLINICAL DATA:  Shortness of breath. EXAM: PORTABLE CHEST 1 VIEW COMPARISON:  Prior chest radiograph 11/05/2017 and earlier FINDINGS: Mild cardiomegaly, unchanged. Aortic atherosclerosis. Minimal linear atelectasis and/or scarring within the lateral left lung base. No appreciable airspace consolidation or pulmonary edema. No evidence of pleural effusion or pneumothorax. No acute bony abnormality identified. IMPRESSION: Minimal linear atelectasis and/or scarring within the lateral left lung base. No appreciable airspace consolidation or pulmonary edema. Mild cardiomegaly, unchanged. Aortic Atherosclerosis (ICD10-I70.0). Electronically Signed   By: Kellie Simmering DO   On: 12/06/2019 12:44    Procedures .Critical Care Performed by: Lennice Sites, DO Authorized by: Lennice Sites, DO   Critical care provider statement:    Critical care time (minutes):  40   Critical care was necessary to treat or prevent imminent or life-threatening deterioration of the following conditions:  Respiratory failure   Critical care was time spent personally by me on the following activities:  Blood draw for specimens, development of treatment plan with patient or surrogate, evaluation of patient's response to treatment, examination of patient, discussions with primary provider, obtaining history from patient or surrogate, ordering and performing treatments and interventions, ordering and review of laboratory studies, ordering and review of radiographic studies, pulse oximetry, re-evaluation of patient's condition and review of old charts   I assumed direction of critical care for this patient from another provider in my specialty: no     (including critical care time)  Medications Ordered in ED Medications  acetaminophen (TYLENOL) tablet 650 mg (650 mg Oral  Given 12/06/19 1208)  ipratropium-albuterol (DUONEB) 0.5-2.5 (3) MG/3ML nebulizer solution  3 mL (3 mLs Nebulization Given 12/06/19 2139)    ED Course  I have reviewed the triage vital signs and the nursing notes.  Pertinent labs & imaging results that were available during my care of the patient were reviewed by me and considered in my medical decision making (see chart for details).    MDM Rules/Calculators/A&P                          SHALLON YAKLIN is an 86 year old female with history of COPD, CHF who presents to the ED with shortness of breath.  Patient with normal vitals.  Room air oxygenation in the low 90s but previously at urgent care was in the 80s.  On 2 L of oxygen now.  Has had shortness of breath over the last several days.  Was started on a Z-Pak yesterday on a telemetry visit.  Covid test is already negative.  She is vaccinated against Covid.  She does use oxygen at nighttime for what she says is from heart failure.  She does not use an inhaler but does state that she has COPD.  She has had gradual shortness of breath with sputum production.  Difficulty walking around without severe shortness of breath.  Denies any chest pain.  She appears to have some leg swelling and pitting edema.  She appears have diminished breath sounds, coarse and rhonchorous.  Not much wheezing.  Suspect possibly some heart failure.  Does appear upon chart review that she has more diastolic heart failure and is treated with blood pressure control and not on diuretics.  EKG shows sinus rhythm.  No ischemic changes.  No significant anemia, electrolyte abdomen, kidney injury.  Chest x-ray overall is unremarkable no obvious edema or infectious process.  Clinically I suspect some heart failure versus possibly COPD.  Will give duo nebs and add troponin and BNP to her lab work.  Anticipate likely admission given new hypoxia.  Patient felt better after DuoNeb's.  Overall BNP is only mildly elevated.  Troponin normal.   Overall still suspect likely a component of reactive airway disease.  Less likely heart failure.  Will admit to medicine however will get a CTA of her chest to rule out PE.  Signed out to Dr. Jonelle Sidle who will admit for new hypoxia.  This chart was dictated using voice recognition software.  Despite best efforts to proofread,  errors can occur which can change the documentation meaning.    Final Clinical Impression(s) / ED Diagnoses Final diagnoses:  Acute respiratory failure with hypoxia (HCC)  SOB (shortness of breath)    Rx / DC Orders ED Discharge Orders    None       Lennice Sites, DO 12/06/19 2336

## 2019-12-06 NOTE — ED Notes (Signed)
Changed patient oxygen tank

## 2019-12-06 NOTE — ED Provider Notes (Signed)
[facility]   MRN: [id number] DOB: July 12, 1930  Subjective:   [name] is a 86 y.o. female presenting for acute on chronic shortness of breath.  Patient states that she was sent here by her PCPs office to have a COVID-19 test.  They had prescribed her azithromycin yesterday from a televisit.  There is no active plan to see her right now, the Covid test was pending.  Has a history of chronic diastolic CHF, COPD.  She is supposed to use 3 L of oxygen at bedtime only.  No current facility-administered medications for this encounter.  Current Outpatient Medications:  .  acetaminophen (TYLENOL) 650 MG CR tablet, Take 650 mg by mouth every 8 (eight) hours as needed for pain., Disp: , Rfl:  .  amLODipine (NORVASC) 5 MG tablet, Take 0.5 tablets (2.5 mg total) by mouth daily., Disp: 90 tablet, Rfl: 3 .  aspirin EC 81 MG tablet, Take 81 mg by mouth daily., Disp: , Rfl:  .  Brinzolamide-Brimonidine (SIMBRINZA) 1-0.2 % SUSP, Apply 1 drop to eye daily after supper., Disp: , Rfl:  .  collagenase (SANTYL) ointment, Apply topically daily., Disp: 15 g, Rfl: 0 .  isosorbide mononitrate (IMDUR) 60 MG 24 hr tablet, TAKE 1 TABLET DAILY, Disp: 90 tablet, Rfl: 3 .  Liraglutide (VICTOZA [place]), Inject 0.6 mg into the skin daily. , Disp: , Rfl:  .  metFORMIN (GLUCOPHAGE) 1000 MG tablet, Take 0.5 tablets (500 mg total) by mouth daily., Disp: , Rfl:  .  telmisartan (MICARDIS) 80 MG tablet, Take 80 mg by mouth daily., Disp: , Rfl:  .  vitamin B-12 (CYANOCOBALAMIN) 1000 MCG tablet, Take 1 tablet (1,000 mcg total) by mouth daily., Disp: 90 tablet, Rfl: 3   Allergies  Allergen Reactions  . Cephalexin Anaphylaxis and Swelling  . Invokana [Canagliflozin]     Pt does not recall  . Lipitor [Atorvastatin [place]] Other (See Comments)    myalgia  . Tramadol Nausea Only    Past Medical History:  Diagnosis Date  . Cellulitis and abscess of left leg   . CHF (congestive heart failure) ([place])   . COPD  (chronic obstructive pulmonary disease) ([place])   . Diabetes mellitus   . Glaucoma   . Hyperlipidemia   . Hypertension      Past Surgical History:  Procedure Laterality Date  . BACK SURGERY    . CARDIAC CATHETERIZATION N/A 12/09/2015   Procedure: Right Heart Cath;  Surgeon: Adrian Prows, MD;  Location: [facility];  Service: Cardiovascular;  Laterality: N/A;  . CHOLECYSTECTOMY  2000  . HERNIA REPAIR  2007  . rupture disk  1970"s  . SHOULDER ARTHROSCOPY WITH ROTATOR CUFF REPAIR  1999   rt shoulder    Family History  Problem Relation Age of Onset  . Stroke Mother     Social History   Tobacco Use  . Smoking status: Never Smoker  . Smokeless tobacco: Never Used  Vaping Use  . Vaping Use: Never used  Substance Use Topics  . Alcohol use: No  . Drug use: No    ROS   Objective:   Vitals: BP (!) 150/76 (BP Location: Left Arm)   Pulse 90   Temp 99.2 F (37.3 C) (Oral)   Resp 18   SpO2 (!) 83%   Pulse oximetry fluctuating between 83 and 85% at rest on room air.  Physical Exam Constitutional:      General: She is not in acute distress.    Appearance:  Normal appearance. She is well-developed. She is obese. She is ill-appearing. She is not toxic-appearing or diaphoretic.  HENT:     Head: Normocephalic and atraumatic.     Nose: Nose normal.     Mouth/Throat:     Mouth: Mucous membranes are moist.  Eyes:     Extraocular Movements: Extraocular movements intact.     Pupils: Pupils are equal, round, and reactive to light.  Cardiovascular:     Rate and Rhythm: Normal rate and regular rhythm.     Pulses: Normal pulses.     Heart sounds: Normal heart sounds. No murmur heard.  No friction rub. No gallop.   Pulmonary:     Effort: Pulmonary effort is normal. No accessory muscle usage, prolonged expiration or respiratory distress.     Breath sounds: No stridor. Decreased breath sounds (Throughout) present. No wheezing, rhonchi or rales.  Skin:    General: Skin is warm  and dry.     Findings: No rash.  Neurological:     Mental Status: She is alert and oriented to person, place, and time.     Comments: Wheelchair-bound.  Psychiatric:        Mood and Affect: Mood normal.        Behavior: Behavior normal.        Thought Content: Thought content normal.        Judgment: Judgment normal.     Assessment and Plan :   PDMP not reviewed this encounter.  1. Shortness of breath   2. Hypoxemia   3. Acute respiratory failure with hypoxia (HCC)   4. Chronic obstructive pulmonary disease, unspecified COPD type ([place])   5. Chronic diastolic heart failure ([place])     Patient is in need of a higher level of care than we can provide in the urgent care setting.  She is severely hypoxic ranging from 83 to 85% on room air at rest.  She has a history of respiratory failure, COPD, chronic diastolic heart failure.  COVID-19 testing is pending.  Given her significant risk factors, redirected patient to the emergency room.  She is with her daughter, they contract for safety and will transport her there by personal vehicle.   Jaynee Eagles, PA-C 12/06/19 1113

## 2019-12-06 NOTE — ED Notes (Signed)
Patient transported to CT 

## 2019-12-06 NOTE — ED Triage Notes (Signed)
Pt presents with shortness of breath; pt has COPD and CHF and PCP recommended her getting a covid test.

## 2019-12-06 NOTE — ED Triage Notes (Signed)
Patient seen at [facility] per her Doctor who sent patient to the ED for evaluation for shortness of breath. History of COPD. States wears oxygen at night 3L [place]. During triage pulse oximetry 91% room air placed on 2L [place]. States has chronic lower back pain and has not had her tylenol yet.

## 2019-12-07 DIAGNOSIS — N183 Chronic kidney disease, stage 3 unspecified: Secondary | ICD-10-CM

## 2019-12-07 DIAGNOSIS — J189 Pneumonia, unspecified organism: Secondary | ICD-10-CM | POA: Diagnosis present

## 2019-12-07 DIAGNOSIS — J9621 Acute and chronic respiratory failure with hypoxia: Secondary | ICD-10-CM | POA: Diagnosis present

## 2019-12-07 DIAGNOSIS — J441 Chronic obstructive pulmonary disease with (acute) exacerbation: Secondary | ICD-10-CM | POA: Diagnosis present

## 2019-12-07 DIAGNOSIS — R0602 Shortness of breath: Secondary | ICD-10-CM | POA: Diagnosis not present

## 2019-12-07 DIAGNOSIS — Z6838 Body mass index (BMI) 38.0-38.9, adult: Secondary | ICD-10-CM | POA: Diagnosis not present

## 2019-12-07 DIAGNOSIS — E785 Hyperlipidemia, unspecified: Secondary | ICD-10-CM | POA: Diagnosis present

## 2019-12-07 DIAGNOSIS — E871 Hypo-osmolality and hyponatremia: Secondary | ICD-10-CM | POA: Diagnosis present

## 2019-12-07 DIAGNOSIS — Z7984 Long term (current) use of oral hypoglycemic drugs: Secondary | ICD-10-CM | POA: Diagnosis not present

## 2019-12-07 DIAGNOSIS — H409 Unspecified glaucoma: Secondary | ICD-10-CM | POA: Diagnosis present

## 2019-12-07 DIAGNOSIS — J180 Bronchopneumonia, unspecified organism: Secondary | ICD-10-CM | POA: Diagnosis present

## 2019-12-07 DIAGNOSIS — N1831 Chronic kidney disease, stage 3a: Secondary | ICD-10-CM | POA: Diagnosis present

## 2019-12-07 DIAGNOSIS — I10 Essential (primary) hypertension: Secondary | ICD-10-CM | POA: Diagnosis not present

## 2019-12-07 DIAGNOSIS — Z823 Family history of stroke: Secondary | ICD-10-CM | POA: Diagnosis not present

## 2019-12-07 DIAGNOSIS — Z7982 Long term (current) use of aspirin: Secondary | ICD-10-CM | POA: Diagnosis not present

## 2019-12-07 DIAGNOSIS — Z20822 Contact with and (suspected) exposure to covid-19: Secondary | ICD-10-CM | POA: Diagnosis present

## 2019-12-07 DIAGNOSIS — Z881 Allergy status to other antibiotic agents status: Secondary | ICD-10-CM | POA: Diagnosis not present

## 2019-12-07 DIAGNOSIS — I272 Pulmonary hypertension, unspecified: Secondary | ICD-10-CM | POA: Diagnosis present

## 2019-12-07 DIAGNOSIS — E1121 Type 2 diabetes mellitus with diabetic nephropathy: Secondary | ICD-10-CM | POA: Diagnosis not present

## 2019-12-07 DIAGNOSIS — I5032 Chronic diastolic (congestive) heart failure: Secondary | ICD-10-CM

## 2019-12-07 DIAGNOSIS — Z79899 Other long term (current) drug therapy: Secondary | ICD-10-CM | POA: Diagnosis not present

## 2019-12-07 DIAGNOSIS — I130 Hypertensive heart and chronic kidney disease with heart failure and stage 1 through stage 4 chronic kidney disease, or unspecified chronic kidney disease: Secondary | ICD-10-CM | POA: Diagnosis present

## 2019-12-07 DIAGNOSIS — E1122 Type 2 diabetes mellitus with diabetic chronic kidney disease: Secondary | ICD-10-CM | POA: Diagnosis present

## 2019-12-07 LAB — HEMOGLOBIN A1C
Hgb A1c MFr Bld: 6.7 % — ABNORMAL HIGH (ref 4.8–5.6)
Mean Plasma Glucose: 145.59 mg/dL

## 2019-12-07 LAB — CBG MONITORING, ED: Glucose-Capillary: 131 mg/dL — ABNORMAL HIGH (ref 70–99)

## 2019-12-07 LAB — GLUCOSE, CAPILLARY
Glucose-Capillary: 192 mg/dL — ABNORMAL HIGH (ref 70–99)
Glucose-Capillary: 119 mg/dL — ABNORMAL HIGH (ref 70–99)
Glucose-Capillary: 108 mg/dL — ABNORMAL HIGH (ref 70–99)
Glucose-Capillary: 146 mg/dL — ABNORMAL HIGH (ref 70–99)

## 2019-12-07 LAB — HIV ANTIBODY (ROUTINE TESTING W REFLEX): HIV Screen 4th Generation wRfx: NONREACTIVE

## 2019-12-07 LAB — MRSA PCR SCREENING: MRSA by PCR: NEGATIVE

## 2019-12-07 MED ORDER — IRBESARTAN 150 MG PO TABS
300.0000 mg | ORAL_TABLET | Freq: Every day | ORAL | Status: DC
  Administered 2019-12-07: 300 mg via ORAL
  Filled 2019-12-07: qty 2

## 2019-12-07 MED ORDER — IPRATROPIUM-ALBUTEROL 0.5-2.5 (3) MG/3ML IN SOLN
3.0000 mL | RESPIRATORY_TRACT | Status: DC | PRN
  Administered 2019-12-07: 3 mL via RESPIRATORY_TRACT

## 2019-12-07 MED ORDER — NETARSUDIL-LATANOPROST 0.02-0.005 % OP SOLN
1.0000 [drp] | Freq: Every day | OPHTHALMIC | Status: DC
  Administered 2019-12-07 – 2019-12-08 (×2): 1 [drp] via OPHTHALMIC

## 2019-12-07 MED ORDER — ENOXAPARIN SODIUM 40 MG/0.4ML ~~LOC~~ SOLN
40.0000 mg | Freq: Every day | SUBCUTANEOUS | Status: DC
  Administered 2019-12-07: 40 mg via SUBCUTANEOUS
  Filled 2019-12-07: qty 0.4

## 2019-12-07 MED ORDER — LIRAGLUTIDE 18 MG/3ML ~~LOC~~ SOPN
0.6000 mg | PEN_INJECTOR | Freq: Every day | SUBCUTANEOUS | Status: DC
  Administered 2019-12-08 – 2019-12-09 (×2): 0.6 mg via SUBCUTANEOUS
  Filled 2019-12-07 (×2): qty 3

## 2019-12-07 MED ORDER — GUAIFENESIN-DM 100-10 MG/5ML PO SYRP
5.0000 mL | ORAL_SOLUTION | ORAL | Status: DC | PRN
  Administered 2019-12-07 – 2019-12-08 (×4): 5 mL via ORAL
  Filled 2019-12-07 (×5): qty 5

## 2019-12-07 MED ORDER — AMLODIPINE BESYLATE 2.5 MG PO TABS
2.5000 mg | ORAL_TABLET | Freq: Every day | ORAL | Status: DC
  Administered 2019-12-07 – 2019-12-09 (×3): 2.5 mg via ORAL
  Filled 2019-12-07 (×3): qty 1

## 2019-12-07 MED ORDER — METFORMIN HCL 500 MG PO TABS
500.0000 mg | ORAL_TABLET | Freq: Every day | ORAL | Status: DC
  Administered 2019-12-07: 500 mg via ORAL
  Filled 2019-12-07: qty 1

## 2019-12-07 MED ORDER — ACETAMINOPHEN 325 MG PO TABS
650.0000 mg | ORAL_TABLET | Freq: Three times a day (TID) | ORAL | Status: DC | PRN
  Administered 2019-12-07 – 2019-12-09 (×2): 650 mg via ORAL
  Filled 2019-12-07 (×2): qty 2

## 2019-12-07 MED ORDER — LEVOFLOXACIN IN D5W 750 MG/150ML IV SOLN
750.0000 mg | INTRAVENOUS | Status: DC
  Administered 2019-12-07: 750 mg via INTRAVENOUS
  Filled 2019-12-07: qty 150

## 2019-12-07 MED ORDER — LEVOFLOXACIN IN D5W 750 MG/150ML IV SOLN
750.0000 mg | INTRAVENOUS | Status: DC
  Administered 2019-12-08: 750 mg via INTRAVENOUS
  Filled 2019-12-07: qty 150

## 2019-12-07 MED ORDER — INSULIN ASPART 100 UNIT/ML ~~LOC~~ SOLN: 0.0000 [IU] | Freq: Every day | SUBCUTANEOUS | Status: DC

## 2019-12-07 MED ORDER — ISOSORBIDE MONONITRATE ER 60 MG PO TB24
60.0000 mg | ORAL_TABLET | Freq: Every day | ORAL | Status: DC
  Administered 2019-12-07 – 2019-12-09 (×3): 60 mg via ORAL
  Filled 2019-12-07 (×3): qty 1

## 2019-12-07 MED ORDER — SODIUM CHLORIDE 0.9 % IV SOLN: INTRAVENOUS | Status: DC

## 2019-12-07 MED ORDER — ASPIRIN EC 81 MG PO TBEC
81.0000 mg | DELAYED_RELEASE_TABLET | Freq: Every day | ORAL | Status: DC
  Administered 2019-12-07 – 2019-12-09 (×3): 81 mg via ORAL
  Filled 2019-12-07 (×3): qty 1

## 2019-12-07 MED ORDER — INSULIN ASPART 100 UNIT/ML ~~LOC~~ SOLN
0.0000 [IU] | Freq: Three times a day (TID) | SUBCUTANEOUS | Status: DC
  Administered 2019-12-07 – 2019-12-08 (×2): 4 [IU] via SUBCUTANEOUS
  Administered 2019-12-08 – 2019-12-09 (×2): 3 [IU] via SUBCUTANEOUS

## 2019-12-07 NOTE — Progress Notes (Addendum)
Paged [facility] about questionable 2nd blood cultures to be drawn that was not drawn in ED. Patient has already received 1 set of cultures and IV antibiotics. Dr. Ilene Qua Opyd- ok to cancel this order. Order cancelled.   0033- Patient awakened suddenly c/o being hot. Ice packs and ice water supplied to patient as well as a fan. Patient appears to have a lot of anxiety at this time. With distraction, patient was ok. This nurse noted that cough seems to be same as earlier and that her voice is becoming more hoarse. Patient requests for  Cough drops. New order noted.  2712- Patient 02 sat dropped to 70s. Patient noted to be mouth breathing. Patient awakened and told to take some deep breaths through her nose. 02 sat came back up to 96% on 3L. Patient continues to have congested cough. Continues to mouth breathe while sleeping.

## 2019-12-07 NOTE — ED Notes (Signed)
MS 

## 2019-12-07 NOTE — H&P (Signed)
History and Physical   [name] ZCH:[id number] DOB: [birth year] DOA: 12/06/2019  Referring MD/NP/PA: Dr. Ronnald Nian  PCP: [name], DO   Outpatient Specialists: Dr. Christen Butter  Patient coming from: Home  Chief Complaint: Shortness of breath  HPI: [name] is a 86 y.o. female with medical history significant of chronic diastolic dysfunction, COPD, diabetes, hypertension, hyperlipidemia, glaucoma, status post previous cardiac cath who is on home oxygen at 3 L presenting with progressive shortness of breath and weakness for the last few days.  Patient was seen yesterday in the urgent care center with same problem.  She is having shortness of breath initially more with exertion but even at rest.  Reported low-grade temperature and continued dry cough.  Denied any sputum at this point.  No hemoptysis.  Patient was seen in the ER still requiring about 4 L of oxygen now.  Initial evaluation with chest x-ray showed no evidence of pulmonary edema.  She is having some mild wheezing.  Subsequent CT chest showed developing pneumonia.  Patient being admitted to the hospital for further evaluation and treatment.  She denied any sick contact.  COVID-19's screen so far negative..  ED Course: Temperature 99.6 blood pressure 165/63 pulse 97 respirate 22 oxygen sat 83% on 20 L 92% now on 4 L.  White count 6.1 hemoglobin 10.8 and platelets are clumped.  Chemistry showed BUN 19 creatinine 1.03 and glucose 170.  Chest x-ray showed minimal linear atelectasis or scarring within the left lateral lung base.  Subsequent CT angiogram of the chest showed no PE but developing infiltrates in the lower lung.  Review of Systems: As per HPI otherwise 10 point review of systems negative.    Past Medical History:  Diagnosis Date  . Cellulitis and abscess of left leg   . CHF (congestive heart failure) ([place])   . COPD (chronic obstructive pulmonary disease) ([place])   . Diabetes mellitus   . Glaucoma   .  Hyperlipidemia   . Hypertension     Past Surgical History:  Procedure Laterality Date  . BACK SURGERY    . CARDIAC CATHETERIZATION N/A 12/09/2015   Procedure: Right Heart Cath;  Surgeon: Adrian Prows, MD;  Location: [facility];  Service: Cardiovascular;  Laterality: N/A;  . CHOLECYSTECTOMY  2000  . HERNIA REPAIR  2007  . rupture disk  1970"s  . SHOULDER ARTHROSCOPY WITH ROTATOR CUFF REPAIR  1999   rt shoulder     reports that she has never smoked. She has never used smokeless tobacco. She reports that she does not drink alcohol and does not use drugs.  Allergies  Allergen Reactions  . Cephalexin Anaphylaxis and Swelling  . Invokana [Canagliflozin]     Pt does not recall  . Lipitor [Atorvastatin Calcium] Other (See Comments)    myalgia  . Tramadol Nausea Only    Family History  Problem Relation Age of Onset  . Stroke Mother      Prior to Admission medications   Medication Sig Start Date End Date Taking? Authorizing Provider  acetaminophen (TYLENOL) 650 MG CR tablet Take 650 mg by mouth every 8 (eight) hours as needed for pain.   Yes [provider]  amLODipine (NORVASC) 5 MG tablet Take 0.5 tablets (2.5 mg total) by mouth daily. 11/23/19  Yes Adrian Prows, MD  aspirin EC 81 MG tablet Take 81 mg by mouth daily.   Yes [provider]  Brinzolamide-Brimonidine (SIMBRINZA) 1-0.2 % SUSP Apply 1 drop to eye  in the morning and at bedtime.    Yes [provider]  isosorbide mononitrate (IMDUR) 60 MG 24 hr tablet TAKE 1 TABLET DAILY 10/18/19  Yes Adrian Prows, MD  liraglutide (VICTOZA) 18 MG/3ML SOPN Inject 0.6 mg into the skin daily.    Yes [provider]  metFORMIN (GLUCOPHAGE) 1000 MG tablet Take 0.5 tablets (500 mg total) by mouth daily. 10/27/16  Yes [name], MD  ROCKLATAN 0.02-0.005 % SOLN Place 1 drop into both eyes at bedtime. 11/24/19  Yes [provider]  telmisartan (MICARDIS) 80 MG tablet Take 80 mg by mouth daily.   Yes  [provider]  collagenase (SANTYL) ointment Apply topically daily. Patient not taking: Reported on 12/07/2019 02/28/19   Domenic Polite, MD    Physical Exam: Vitals:   12/06/19 2319 12/06/19 2320 12/07/19 0002 12/07/19 0101  BP:   (!) 165/63   Pulse: 90 86 97 90  Resp:   20 15  Temp:      TempSrc:      SpO2: 98% 99% 99% 92%  Weight:      Height:          Constitutional: Morbidly obese, anxious, no distress Vitals:   12/06/19 2319 12/06/19 2320 12/07/19 0002 12/07/19 0101  BP:   (!) 165/63   Pulse: 90 86 97 90  Resp:   20 15  Temp:      TempSrc:      SpO2: 98% 99% 99% 92%  Weight:      Height:       Eyes: PERRL, lids and conjunctivae normal ENMT: Mucous membranes are moist. Posterior pharynx clear of any exudate or lesions.Normal dentition.  Neck: normal, supple, no masses, no thyromegaly Respiratory: Decreased air entry with coarse breath sound, mild expiratory wheezing, normal respiratory effort. No accessory muscle use.  Cardiovascular: Regular rate and rhythm, no murmurs / rubs / gallops.  1+ extremity edema. 2+ pedal pulses. No carotid bruits.  Abdomen: no tenderness, no masses palpated. No hepatosplenomegaly. Bowel sounds positive.  Musculoskeletal: no clubbing / cyanosis. No joint deformity upper and lower extremities. Good ROM, no contractures. Normal muscle tone.  Skin: no rashes, lesions, ulcers. No induration Neurologic: CN 2-12 grossly intact. Sensation intact, DTR normal. Strength 5/5 in all 4.  Psychiatric: Normal judgment and insight. Alert and oriented x 3. Normal mood.     Labs on Admission: I have personally reviewed following labs and imaging studies  CBC: Recent Labs  Lab 12/06/19 1206  WBC 6.1  HGB 10.8*  HCT 35.6*  MCV 96.0  PLT PLATELETS APPEAR ADEQUATE   Basic Metabolic Panel: Recent Labs  Lab 12/06/19 1206  NA 135  K 4.5  CL 97*  CO2 28  GLUCOSE 170*  BUN 19  CREATININE 1.02*  CALCIUM 9.1   GFR: Estimated  Creatinine Clearance: 45.2 mL/min (A) (by C-G formula based on SCr of 1.02 mg/dL (H)). Liver Function Tests: No results for input(s): AST, ALT, ALKPHOS, BILITOT, PROT, ALBUMIN in the last 168 hours. No results for input(s): LIPASE, AMYLASE in the last 168 hours. No results for input(s): AMMONIA in the last 168 hours. Coagulation Profile: No results for input(s): INR, PROTIME in the last 168 hours. Cardiac Enzymes: No results for input(s): CKTOTAL, CKMB, CKMBINDEX, TROPONINI in the last 168 hours. BNP (last 3 results) No results for input(s): PROBNP in the last 8760 hours. HbA1C: No results for input(s): HGBA1C in the last 72 hours. CBG: Recent Labs  Lab 12/06/19 1622  GLUCAP  140*   Lipid Profile: No results for input(s): CHOL, HDL, LDLCALC, TRIG, CHOLHDL, LDLDIRECT in the last 72 hours. Thyroid Function Tests: No results for input(s): TSH, T4TOTAL, FREET4, T3FREE, THYROIDAB in the last 72 hours. Anemia Panel: No results for input(s): VITAMINB12, FOLATE, FERRITIN, TIBC, IRON, RETICCTPCT in the last 72 hours. Urine analysis:    Component Value Date/Time   COLORURINE YELLOW 11/03/2017 0004   APPEARANCEUR CLEAR 11/03/2017 0004   LABSPEC 1.018 11/03/2017 0004   PHURINE 5.0 11/03/2017 0004   GLUCOSEU NEGATIVE 11/03/2017 0004   HGBUR NEGATIVE 11/03/2017 0004   BILIRUBINUR NEGATIVE 11/03/2017 0004   KETONESUR NEGATIVE 11/03/2017 0004   PROTEINUR NEGATIVE 11/03/2017 0004   UROBILINOGEN 0.2 12/05/2010 1740   NITRITE NEGATIVE 11/03/2017 0004   LEUKOCYTESUR SMALL (A) 11/03/2017 0004   Sepsis Labs: @LABRCNTIP (procalcitonin:4,lacticidven:4) ) Recent Results (from the past 240 hour(s))  SARS Coronavirus 2 by RT PCR (hospital order, performed in [place] hospital lab) Nasopharyngeal Nasopharyngeal Swab     Status: None   Collection Time: 12/06/19 12:01 PM   Specimen: Nasopharyngeal Swab  Result Value Ref Range Status   SARS Coronavirus 2 NEGATIVE NEGATIVE Final    Comment:  (NOTE) SARS-CoV-2 target nucleic acids are NOT DETECTED.  The SARS-CoV-2 RNA is generally detectable in upper and lower respiratory specimens during the acute phase of infection. The lowest concentration of SARS-CoV-2 viral copies this assay can detect is 250 copies / mL. A negative result does not preclude SARS-CoV-2 infection and should not be used as the sole basis for treatment or other patient management decisions.  A negative result may occur with improper specimen collection / handling, submission of specimen other than nasopharyngeal swab, presence of viral mutation(s) within the areas targeted by this assay, and inadequate number of viral copies (<250 copies / mL). A negative result must be combined with clinical observations, patient history, and epidemiological information.  Fact Sheet for Patients:   StrictlyIdeas.no  Fact Sheet for Healthcare Providers: BankingDealers.co.za  This test is not yet approved or  cleared by the [place] FDA and has been authorized for detection and/or diagnosis of SARS-CoV-2 by FDA under an Emergency Use Authorization (EUA).  This EUA will remain in effect (meaning this test can be used) for the duration of the COVID-19 declaration under Section 564(b)(1) of the Act, 21 U.S.C. section 360bbb-3(b)(1), unless the authorization is terminated or revoked sooner.  Performed at [facility], [place] [address], [place], [place] [address]      Radiological Exams on Admission: CT ANGIO CHEST PE W OR WO CONTRAST  Result Date: 12/06/2019 CLINICAL DATA:  86 year old female with chest pain, shortness of breath. On home oxygen. EXAM: CT ANGIOGRAPHY CHEST WITH CONTRAST TECHNIQUE: Multidetector CT imaging of the chest was performed using the standard protocol during bolus administration of intravenous contrast. Multiplanar CT image reconstructions and MIPs were obtained to evaluate the vascular  anatomy. CONTRAST:  71mL OMNIPAQUE IOHEXOL 350 MG/ML SOLN COMPARISON:  Portable chest earlier today.  Chest CT 12/05/2010. FINDINGS: Cardiovascular: Adequate contrast bolus timing in the pulmonary arterial tree. Respiratory motion, especially in the lower lobes. Subsequently, the subsegmental lower lobe branches are not well evaluated. But elsewhere no pulmonary artery filling defect is identified. No cardiomegaly or pericardial effusion. Negative visible aorta aside from atherosclerosis. Evidence of calcified coronary artery atherosclerosis (series 6, image 166). Mediastinum/Nodes: Mild, reactive appearing left hilar lymph nodes which are increased compared to 2012. No mediastinal lymphadenopathy. Lungs/Pleura: Lower lung volumes compared to 2012. Major airways  appear patent. Mild bilateral perihilar atelectasis, with additional nodular and ground-glass peribronchial opacity in the superior segment of the left lower lobe (series 7, image 36). No other acute pulmonary opacity. A small right lung nodule on series 7, image 42 is stable since 2012 and benign. No pleural effusion. Upper Abdomen: Negative visible liver, pancreas, adrenal glands, left kidney and bowel in the upper abdomen. Possible small gastric hiatal hernia. There is also a small hypodense circumscribed round area in the spleen measuring 17 mm on series 5, image 91. This was not included in 2012 but has a benign appearance. Musculoskeletal: No acute osseous abnormality identified. Postoperative changes to the proximal right humerus again noted. Chronic but progressed thoracic spine degeneration since 2012. Review of the MIP images confirms the above findings. IMPRESSION: 1. No pulmonary embolus identified, although the subsegmental lower lobe branches are not well evaluated due to respiratory motion. 2. Lower lung volumes with mild atelectasis, but additional nodular and ground-glass peribronchial opacity in the superior segment of the left lower lobe  suspicious for developing bronchopneumonia. Mild reactive appearing left hilar lymph nodes. No pleural effusion. 3. Small benign appearing hypodense area in the spleen, such as splenic hemangioma. Calcified coronary artery and Aortic Atherosclerosis (ICD10-I70.0). Electronically Signed   By: Genevie Ann M.D.   On: 12/06/2019 23:57   DG Chest Portable 1 View  Result Date: 12/06/2019 CLINICAL DATA:  Shortness of breath. EXAM: PORTABLE CHEST 1 VIEW COMPARISON:  Prior chest radiograph 11/05/2017 and earlier FINDINGS: Mild cardiomegaly, unchanged. Aortic atherosclerosis. Minimal linear atelectasis and/or scarring within the lateral left lung base. No appreciable airspace consolidation or pulmonary edema. No evidence of pleural effusion or pneumothorax. No acute bony abnormality identified. IMPRESSION: Minimal linear atelectasis and/or scarring within the lateral left lung base. No appreciable airspace consolidation or pulmonary edema. Mild cardiomegaly, unchanged. Aortic Atherosclerosis (ICD10-I70.0). Electronically Signed   By: Kellie Simmering DO   On: 12/06/2019 12:44    EKG: Independently reviewed.  Sinus rhythm with a first-degree AV block.  Possible LVH by voltage criteria.  Assessment/Plan Principal Problem:   CAP (community acquired pneumonia) Active Problems:   Pulmonary hypertension (HCC)   Chronic diastolic CHF (congestive heart failure) (HCC)   Diabetes mellitus (HCC)   Obesity, diabetes, and hypertension syndrome (HCC)   CKD (chronic kidney disease) stage 3, GFR 30-59 ml/min   Essential hypertension     #1 community-acquired pneumonia: Most likely cause is early pneumonia.  Patient will be admitted.  Initiate IV Levaquin due to patient's cephalosporin allergies.  Cultures obtained.  Monitor cough and if sputum production will initiate sputum culture.  Continue oxygen.  #2 chronic diastolic heart failure: Patient has chronic pulmonary hypertension with lower extremity edema.  No evidence of  pulmonary congestion at this point.  #3 diabetes: Continue with sliding scale insulin. Non-insulin-dependent  #4 morbid obesity: Continue dietary restriction.  #5 chronic kidney disease stage III: BUN/creatinine at baseline.  #6 chronic respiratory failure: Continue oxygen.  #7 pulmonary hypertension: Stable.  Continue oxygen.  Follow-up with cardiology at DC     DVT prophylaxis: Lovenox Code Status: Full code Family Communication: Husband at bedside Disposition Plan: Home Consults called: None Admission status: Inpatient  Severity of Illness: The appropriate patient status for this patient is INPATIENT. Inpatient status is judged to be reasonable and necessary in order to provide the required intensity of service to ensure the patient's safety. The patient's presenting symptoms, physical exam findings, and initial radiographic and laboratory data in the context of their  chronic comorbidities is felt to place them at high risk for further clinical deterioration. Furthermore, it is not anticipated that the patient will be medically stable for discharge from the hospital within 2 midnights of admission. The following factors support the patient status of inpatient.   " The patient's presenting symptoms include shortness of breath and cough. " The worrisome physical exam findings include coarse breath sounds with mild wheezing. " The initial radiographic and laboratory data are worrisome because of CT angiogram showing developing pneumonia. " The chronic co-morbidities include hypertension and diabetes.   * I certify that at the point of admission it is my clinical judgment that the patient will require inpatient hospital care spanning beyond 2 midnights from the point of admission due to high intensity of service, high risk for further deterioration and high frequency of surveillance required.Barbette Merino MD [facility] Pager [phone]  If 7PM-7AM, please contact  night-coverage [email or web address] Password [email or web address]  12/07/2019, 1:18 AM

## 2019-12-07 NOTE — ED Notes (Signed)
Admitting at bedside 

## 2019-12-07 NOTE — Plan of Care (Signed)
  Problem: Education: Goal: Knowledge of General Education information will improve Description: Including pain rating scale, medication(s)/side effects and non-pharmacologic comfort measures Outcome: Progressing   Problem: Health Behavior/Discharge Planning: Goal: Ability to manage health-related needs will improve Outcome: Progressing   Problem: Clinical Measurements: Goal: Cardiovascular complication will be avoided Outcome: Progressing   Problem: Nutrition: Goal: Adequate nutrition will be maintained Outcome: Progressing   Problem: Coping: Goal: Level of anxiety will decrease Outcome: Progressing   Problem: Pain Managment: Goal: General experience of comfort will improve Outcome: Progressing   

## 2019-12-07 NOTE — Progress Notes (Signed)
PROGRESS NOTE  [name]  DOB: 10-03-1930  PCP: [name], DO VWU:[id number]  DOA: 12/06/2019  LOS: 0 days   Chief Complaint  Patient presents with  . Shortness of Breath   Brief narrative: [name] is a 86 y.o. female with PMH of DM2, HTN, HLD, diastolic CHF, COPD on 3 L at home, impaired mobility who lives at home with her husband. Patient presented to the ED on 12/06/2019 with complaint of progressively worsening shortness of breath and weakness for last few days associated with low-grade temperature and productive cough.  In the ED, patient had temperature of 99.6, blood pressure 165/63, oxygen saturation 83% on 3 L and improved to 92% on 4 L. CT angio of chest showed nodular and ground-glass peribronchial opacity in the superior segment of the left lower lobe suspicious for developing bronchopneumonia. COVID-19 PCR negative. Patient was admitted to hospitalist service for further evaluation management.  Subjective: Patient was seen and examined this morning. Pleasant elderly Caucasian female.  Propped up in bed.  On 4 L oxygen.  Husband at bedside. Patient is congested liquid watery eyes, wet cough.  Currently receiving IV fluid. She feels slowly better than at presentation.  Assessment/Plan: Community-acquired pneumonia Acute exacerbation of COPD Acute on chronic respiratory failure with hypoxia  -presented with worsening shortness of breath, productive cough, low-grade fever -CT chest with developing bronchopneumonia and left lower lobe.  -Currently on IV Levaquin. (QTC 441 ms.) -Add Mucinex, incentive spirometry, flutter valve. -Speech therapy evaluation. -On 3 L oxygen at home.  Currently requiring 4 L at rest.  Wean down as tolerated.  Cardiovascular issues:  Chronic diastolic CHF, essential hypertension, hyperlipidemia -Home meds include amlodipine 2.5 mg daily, Imdur 60 mg daily, telmisartan 80 mg daily and aspirin 81 mg daily. -Continue  all. -Continue to monitor blood pressure.  Stop IV fluid.  Diabetes mellitus type 2 - A1c 6.7 on 8/12 -Home meds include Victoza 0.6 mg daily, Metformin 500 mg daily -continue Victoza.  Keep Metformin on hold.  Obesity - Body mass index is 38.62 kg/m. Patient has been advised to make an attempt to improve diet and exercise patterns to aid in weight loss.  Chronic kidney disease stage III: BUN/creatinine at baseline. Recent Labs    02/26/19 2024 02/27/19 0716 02/28/19 0418 12/06/19 1206  CREATININE 1.31* 1.18* 1.10* 1.02*   Mobility: Uses walker at home.  PT eval Code Status:   Code Status: Full Code  Nutritional status: Body mass index is 38.62 kg/m.     Diet Order            Diet Heart Room service appropriate? Yes; Fluid consistency: Thin  Diet effective now                 DVT prophylaxis: enoxaparin (LOVENOX) injection 40 mg Start: 12/07/19 1000   Antimicrobials:  IV Levaquin Fluid: Stop IV fluid  Consultants: None Family Communication:  Husband at bedside  Status is: Inpatient  Remains inpatient appropriate because:Ongoing diagnostic testing needed not appropriate for outpatient work up and IV treatments appropriate due to intensity of illness or inability to take PO   Dispo: The patient is from: Home              Anticipated d/c is to: Home              Anticipated d/c date is: 2 days              Patient currently is not medically stable  to d/c.       Infusions:  . [START ON 12/08/2019] levofloxacin (LEVAQUIN) IV      Scheduled Meds: . amLODipine  2.5 mg Oral Daily  . aspirin EC  81 mg Oral Daily  . enoxaparin (LOVENOX) injection  40 mg Subcutaneous Daily  . insulin aspart  0-20 Units Subcutaneous TID WC  . insulin aspart  0-5 Units Subcutaneous QHS  . irbesartan  300 mg Oral Daily  . isosorbide mononitrate  60 mg Oral Daily  . liraglutide  0.6 mg Subcutaneous Daily  . Netarsudil-Latanoprost  1 drop Both Eyes QHS     Antimicrobials: Anti-infectives (From admission, onward)   Start     Dose/Rate Route Frequency Ordered Stop   12/08/19 2200  levofloxacin (LEVAQUIN) IVPB 750 mg     Discontinue    Note to Pharmacy: Levaquin 750 mg IV q48h for CrCl < 50 mL/min   750 mg 100 mL/hr over 90 Minutes Intravenous Every 48 hours 12/07/19 0616 12/14/19 2159   12/07/19 0145  levofloxacin (LEVAQUIN) IVPB 750 mg  Status:  Discontinued        750 mg 100 mL/hr over 90 Minutes Intravenous Every 48 hours 12/07/19 0140 12/07/19 0636      PRN meds: acetaminophen   Objective: Vitals:   12/07/19 0817 12/07/19 1040  BP: (!) 151/55 (!) 143/60  Pulse: 81 95  Resp: 19 15  Temp: 98.6 F (37 C) 98.6 F (37 C)  SpO2: 97% 98%    Intake/Output Summary (Last 24 hours) at 12/07/2019 1330 Last data filed at 12/07/2019 0554 Gross per 24 hour  Intake 213.33 ml  Output 650 ml  Net -436.67 ml   Filed Weights   12/06/19 1154  Weight: 102.1 kg   Weight change:  Body mass index is 38.62 kg/m.   Physical Exam: General exam: Appears calm and comfortable.  Not in physical distress Skin: No rashes, lesions or ulcers. HEENT: Atraumatic, normocephalic, supple neck, no obvious bleeding.  Congested look Lungs: Shallow respiratory effort.  Diminished air entry both.  No crackles or wheezing CVS: Regular rate and rhythm, no murmur GI/Abd soft, nontender, nondistended, bowel sound present CNS: Alert, awake, oriented x3 Psychiatry: Mood appropriate Extremities: No pedal edema, no calf tenderness  Data Review: I have personally reviewed the laboratory data and studies available.  Recent Labs  Lab 12/06/19 1206  WBC 6.1  HGB 10.8*  HCT 35.6*  MCV 96.0  PLT PLATELETS APPEAR ADEQUATE   Recent Labs  Lab 12/06/19 1206  NA 135  K 4.5  CL 97*  CO2 28  GLUCOSE 170*  BUN 19  CREATININE 1.02*  CALCIUM 9.1   Lab Results  Component Value Date   HGBA1C 6.7 (H) 12/07/2019       Component Value Date/Time   CHOL  131 10/26/2016 0912   TRIG 117 10/26/2016 0912   HDL 34 (L) 10/26/2016 0912   CHOLHDL 3.9 10/26/2016 0912   VLDL 23 10/26/2016 0912   LDLCALC 74 10/26/2016 0912    Signed, Terrilee Croak, MD [facility] Pager: [phone] (Secure Chat preferred). 12/07/2019

## 2019-12-07 NOTE — ED Notes (Addendum)
Patient placed on purewick for accurate I&o 

## 2019-12-07 NOTE — ED Notes (Signed)
MS Breakfast Ordered 

## 2019-12-07 NOTE — Progress Notes (Signed)
Pharmacy Antibiotic Note  [name] is a 86 y.o. female admitted on 12/06/2019 with pneumonia.  Pharmacy has been consulted for Levaquin dosing. WBC WNL. CrCl ~45.   Plan: Levaquin 750 mg IV q48h Trend WBC, temp, renal function  F/U infectious work-up  Height: 5\' 4"  (162.6 cm) Weight: 102.1 kg (225 lb) IBW/kg (Calculated) : 54.7  Temp (24hrs), Avg:99 F (37.2 C), Min:98.4 F (36.9 C), Max:99.6 F (37.6 C)  Recent Labs  Lab 12/06/19 1206  WBC 6.1  CREATININE 1.02*    Estimated Creatinine Clearance: 45.2 mL/min (A) (by C-G formula based on SCr of 1.02 mg/dL (H)).    Allergies  Allergen Reactions  . Cephalexin Anaphylaxis and Swelling  . Invokana [Canagliflozin]     Pt does not recall  . Lipitor [Atorvastatin Calcium] Other (See Comments)    myalgia  . Tramadol Nausea Only   Narda Bonds, PharmD, BCPS Clinical Pharmacist Phone: [phone]

## 2019-12-08 LAB — GLUCOSE, CAPILLARY
Glucose-Capillary: 129 mg/dL — ABNORMAL HIGH (ref 70–99)
Glucose-Capillary: 149 mg/dL — ABNORMAL HIGH (ref 70–99)
Glucose-Capillary: 168 mg/dL — ABNORMAL HIGH (ref 70–99)
Glucose-Capillary: 147 mg/dL — ABNORMAL HIGH (ref 70–99)

## 2019-12-08 LAB — EXPECTORATED SPUTUM ASSESSMENT W GRAM STAIN, RFLX TO RESP C

## 2019-12-08 LAB — BASIC METABOLIC PANEL
Anion gap: 8 (ref 5–15)
BUN: 19 mg/dL (ref 8–23)
CO2: 25 mmol/L (ref 22–32)
Calcium: 8.7 mg/dL — ABNORMAL LOW (ref 8.9–10.3)
Chloride: 97 mmol/L — ABNORMAL LOW (ref 98–111)
Creatinine, Ser: 1.19 mg/dL — ABNORMAL HIGH (ref 0.44–1.00)
GFR calc Af Amer: 48 mL/min — ABNORMAL LOW (ref >60)
GFR calc non Af Amer: 41 mL/min — ABNORMAL LOW (ref >60)
Glucose, Bld: 116 mg/dL — ABNORMAL HIGH (ref 70–99)
Potassium: 5.4 mmol/L — ABNORMAL HIGH (ref 3.5–5.1)
Sodium: 130 mmol/L — ABNORMAL LOW (ref 135–145)

## 2019-12-08 MED ORDER — MENTHOL 3 MG MT LOZG
1.0000 | LOZENGE | OROMUCOSAL | Status: DC | PRN
  Administered 2019-12-08: 3 mg via ORAL
  Filled 2019-12-08: qty 9

## 2019-12-08 MED ORDER — HEPARIN SODIUM (PORCINE) 5000 UNIT/ML IJ SOLN
5000.0000 [IU] | Freq: Three times a day (TID) | INTRAMUSCULAR | Status: DC
  Administered 2019-12-08 – 2019-12-09 (×3): 5000 [IU] via SUBCUTANEOUS
  Filled 2019-12-08 (×3): qty 1

## 2019-12-08 NOTE — Plan of Care (Signed)
  Problem: Clinical Measurements: Goal: Ability to maintain clinical measurements within normal limits will improve Outcome: Progressing   Problem: Education: Goal: Knowledge of General Education information will improve Description: Including pain rating scale, medication(s)/side effects and non-pharmacologic comfort measures Outcome: Progressing   Problem: Clinical Measurements: Goal: Will remain free from infection Outcome: Progressing   Problem: Clinical Measurements: Goal: Diagnostic test results will improve Outcome: Progressing   Problem: Clinical Measurements: Goal: Respiratory complications will improve Outcome: Progressing   Problem: Safety: Goal: Ability to remain free from injury will improve Outcome: Progressing

## 2019-12-08 NOTE — Progress Notes (Addendum)
PROGRESS NOTE  [name]  DOB: 10/24/1930  PCP: [name], DO GTX:[id number]  DOA: 12/06/2019  LOS: 1 day   Chief Complaint  Patient presents with   Shortness of Breath   Brief narrative: [name] is a 86 y.o. female with PMH of DM2, HTN, HLD, diastolic CHF, COPD on 3 L at home, impaired mobility who lives at home with her husband. Patient presented to the ED on 12/06/2019 with complaint of progressively worsening shortness of breath and weakness for last few days associated with low-grade temperature and productive cough.  In the ED, patient had temperature of 99.6, blood pressure 165/63, oxygen saturation 83% on 3 L and improved to 92% on 4 L. CT angio of chest showed nodular and ground-glass peribronchial opacity in the superior segment of the left lower lobe suspicious for developing bronchopneumonia. COVID-19 PCR negative. Patient was admitted to hospitalist service for further evaluation management.  Subjective: Patient was seen and examined this morning. Propped up in bed.  Not in distress.  On 3 L Oxymizer cannula.   Patient daughter was at bedside.   Eyes red, reportedly due to glaucoma. Last night, patient had shortness of breath which the nurse described as anxiety attack.  She was able to talk her out of it. Labs from this morning with sodium down to 130, potassium up to 5.4, creatinine up to 1.19.  Assessment/Plan: Community-acquired pneumonia Acute exacerbation of COPD Acute on chronic respiratory failure with hypoxia  -presented with worsening shortness of breath, productive cough, low-grade fever -CT chest with developing bronchopneumonia and left lower lobe.  -Currently on IV Levaquin. (QTC 441 ms.) -Clinically improving. -Also continue Mucinex, incentive spirometry, flutter valve. -Speech therapy evaluation ordered to evaluate for aspiration tendency. -On 3 L oxygen at home.  Currently requiring 4 L at rest.  Wean down as  tolerated.  Cardiovascular issues:  Chronic diastolic CHF, essential hypertension, hyperlipidemia -Home meds include amlodipine 2.5 mg daily, Imdur 60 mg daily, telmisartan 80 mg daily and aspirin 81 mg daily. -With rising creatinine and potassium, I held ARB today.  Continue others. -Continue to monitor blood pressure.    Acute hyponatremia -Sodium level down to 130 today.  Continue to monitor. Recent Labs  Lab 12/06/19 1206 12/08/19 0022  NA 135 130*   Acute hyperkalemia -Potassium level elevated to 5.4 today.  Not on potassium supplement.  ARB held today.  Continue to monitor potassium.  Diabetes mellitus type 2 - A1c 6.7 on 8/12 -Home meds include Victoza 0.6 mg daily, Metformin 500 mg daily -continue Victoza.  Keep Metformin on hold.  Obesity - Body mass index is 38.62 kg/m. Patient has been advised to make an attempt to improve diet and exercise patterns to aid in weight loss.  Chronic kidney disease stage 3a: BUN/creatinine at baseline. Recent Labs    02/26/19 2024 02/27/19 0716 02/28/19 0418 12/06/19 1206 12/08/19 0022  CREATININE 1.31* 1.18* 1.10* 1.02* 1.19*   Mobility: Uses walker at home.  Pending PT eval. Code Status:   Code Status: Full Code  Nutritional status: Body mass index is 38.62 kg/m.     Diet Order            Diet Heart Room service appropriate? Yes; Fluid consistency: Thin  Diet effective now                 DVT prophylaxis: heparin injection 5,000 Units Start: 12/08/19 1400   Antimicrobials:  IV Levaquin Fluid: Stop IV fluid  Consultants: None Family  Communication:  Husband at bedside  Status is: Inpatient  Remains inpatient appropriate because:Ongoing diagnostic testing needed not appropriate for outpatient work up and IV treatments appropriate due to intensity of illness or inability to take PO   Dispo: The patient is from: Home              Anticipated d/c is to: Home              Anticipated d/c date is: Likely  tomorrow              Patient currently is not medically stable to d/c.  Infusions:   levofloxacin (LEVAQUIN) IV      Scheduled Meds:  amLODipine  2.5 mg Oral Daily   aspirin EC  81 mg Oral Daily   heparin injection (subcutaneous)  5,000 Units Subcutaneous Q8H   insulin aspart  0-20 Units Subcutaneous TID WC   insulin aspart  0-5 Units Subcutaneous QHS   isosorbide mononitrate  60 mg Oral Daily   liraglutide  0.6 mg Subcutaneous Daily   Netarsudil-Latanoprost  1 drop Both Eyes QHS    Antimicrobials: Anti-infectives (From admission, onward)   Start     Dose/Rate Route Frequency Ordered Stop   12/08/19 2200  levofloxacin (LEVAQUIN) IVPB 750 mg     Discontinue    Note to Pharmacy: Levaquin 750 mg IV q48h for CrCl < 50 mL/min   750 mg 100 mL/hr over 90 Minutes Intravenous Every 48 hours 12/07/19 0616 12/14/19 2159   12/07/19 0145  levofloxacin (LEVAQUIN) IVPB 750 mg  Status:  Discontinued        750 mg 100 mL/hr over 90 Minutes Intravenous Every 48 hours 12/07/19 0140 12/07/19 0636      PRN meds: acetaminophen, guaiFENesin-dextromethorphan, ipratropium-albuterol, menthol-cetylpyridinium   Objective: Vitals:   12/08/19 0956 12/08/19 1120  BP: (!) 145/62 (!) 129/54  Pulse:  74  Resp:  20  Temp:  97.9 F (36.6 C)  SpO2:  96%    Intake/Output Summary (Last 24 hours) at 12/08/2019 1355 Last data filed at 12/08/2019 0345 Gross per 24 hour  Intake 281.67 ml  Output 1425 ml  Net -1143.33 ml   Filed Weights   12/06/19 1154  Weight: 102.1 kg   Weight change:  Body mass index is 38.62 kg/m.   Physical Exam: General exam: Appears calm and comfortable.  Not in physical distress Skin: No rashes, lesions or ulcers. HEENT: Atraumatic, normocephalic, supple neck, no obvious bleeding.  Congested look Lungs: Clear to auscultation bilaterally  CVS: Regular rate and rhythm, no murmur GI/Abd soft, nontender, nondistended, bowel sound present CNS: Alert, awake, oriented  x3 Psychiatry: Mood appropriate Extremities: No pedal edema, no calf tenderness  Data Review: I have personally reviewed the laboratory data and studies available.  Recent Labs  Lab 12/06/19 1206  WBC 6.1  HGB 10.8*  HCT 35.6*  MCV 96.0  PLT PLATELETS APPEAR ADEQUATE   Recent Labs  Lab 12/06/19 1206 12/08/19 0022  NA 135 130*  K 4.5 5.4*  CL 97* 97*  CO2 28 25  GLUCOSE 170* 116*  BUN 19 19  CREATININE 1.02* 1.19*  CALCIUM 9.1 8.7*   Lab Results  Component Value Date   HGBA1C 6.7 (H) 12/07/2019       Component Value Date/Time   CHOL 131 10/26/2016 0912   TRIG 117 10/26/2016 0912   HDL 34 (L) 10/26/2016 0912   CHOLHDL 3.9 10/26/2016 0912   VLDL 23 10/26/2016 0912   LDLCALC 74  10/26/2016 0912    Signed, Terrilee Croak, MD [facility] Pager: [phone] (Secure Chat preferred). 12/08/2019

## 2019-12-08 NOTE — Evaluation (Signed)
Clinical/Bedside Swallow Evaluation Patient Details  Name: SARAH BAEZ MRN: [id number] Date of Birth: 1930-09-04  Today's Date: 12/08/2019 Time: SLP Start Time (ACUTE ONLY): 1600 SLP Stop Time (ACUTE ONLY): 1612 SLP Time Calculation (min) (ACUTE ONLY): 12 min  Past Medical History:  Past Medical History:  Diagnosis Date  . Cellulitis and abscess of left leg   . CHF (congestive heart failure) ([place])   . COPD (chronic obstructive pulmonary disease) ([place])   . Diabetes mellitus   . Glaucoma   . Hyperlipidemia   . Hypertension    Past Surgical History:  Past Surgical History:  Procedure Laterality Date  . BACK SURGERY    . CARDIAC CATHETERIZATION N/A 12/09/2015   Procedure: Right Heart Cath;  Surgeon: Adrian Prows, MD;  Location: [facility];  Service: Cardiovascular;  Laterality: N/A;  . CHOLECYSTECTOMY  2000  . HERNIA REPAIR  2007  . rupture disk  1970"s  . SHOULDER ARTHROSCOPY WITH ROTATOR CUFF REPAIR  1999   rt shoulder   HPI:  85 y.o. female with PMH of DM2, HTN, HLD, diastolic CHF, COPD on 3 L at home, impaired mobility who lives at home with her husband. Patient presented to the ED on 12/06/2019 with complaint of progressively worsening shortness of breath and weakness for last few days associated with low-grade temperature and productive cough. Dx CAP, acute exacerbation COPD, SLP ordered due to concerns for potential aspiration.    Assessment / Plan / Recommendation Clinical Impression  Pt presents with functional oropharyngeal swallow with active mastication, brisk swallow response, no overt s/s of aspiration, and normal swallow/respiratory synchrony.  Pt passed three oz water test, as well as consumed mixed solid/liquid consistencies, with no concerns for dysphagia. Pt and her daughter report no hx of problems.  Recommend continued regular solids, thin liquids.  No SLP f/u is needed.  SLP Visit Diagnosis: Dysphagia, unspecified (R13.10)    Aspiration Risk  No  limitations    Diet Recommendation   regular solids, thin liquids  Medication Administration: Whole meds with liquid    Other  Recommendations Oral Care Recommendations: Oral care BID   Follow up Recommendations None      Frequency and Duration            Prognosis        Swallow Study   General HPI: 86 y.o. female with PMH of DM2, HTN, HLD, diastolic CHF, COPD on 3 L at home, impaired mobility who lives at home with her husband. Patient presented to the ED on 12/06/2019 with complaint of progressively worsening shortness of breath and weakness for last few days associated with low-grade temperature and productive cough. Dx CAP, acute exacerbation COPD, SLP ordered due to concerns for potential aspiration.  Type of Study: Bedside Swallow Evaluation Previous Swallow Assessment: no Diet Prior to this Study: Regular;Thin liquids Temperature Spikes Noted: No Respiratory Status: Room air History of Recent Intubation: No Behavior/Cognition: Alert;Cooperative Oral Cavity Assessment: Within Functional Limits Oral Care Completed by SLP: No Oral Cavity - Dentition: Adequate natural dentition Vision: Functional for self-feeding Self-Feeding Abilities: Able to feed self Patient Positioning: Upright in bed Baseline Vocal Quality: Normal Volitional Cough: Strong Volitional Swallow: Able to elicit    Oral/Motor/Sensory Function Overall Oral Motor/Sensory Function: Within functional limits   Ice Chips Ice chips: Within functional limits   Thin Liquid Thin Liquid: Within functional limits    Nectar Thick Nectar Thick Liquid: Not tested   Honey Thick Honey Thick Liquid: Not tested  Puree Puree: Within functional limits   Solid     Solid: Within functional limits      Juan Quam Laurice 12/08/2019,4:55 PM   Estill Bamberg L. Tivis Ringer, [place] Office number [phone] Pager [phone]

## 2019-12-08 NOTE — Progress Notes (Signed)
Complained of cough, robitussin given. Started with incentive spirometry obtained  500 ml level with good technique. Encouraged to do it at freq. Interval . Daughter at bedside to help.

## 2019-12-08 NOTE — Evaluation (Signed)
Physical Therapy Evaluation Patient Details Name: [name] MRN: [id number] DOB: Jun 02, 1930 Today's Date: 12/08/2019   History of Present Illness  Pt adm with acute on chronic respiratory failure with hypoxi due to PNA and copd exacerbation. PMH - DM, HTN, diastolic chf, copd, obesity.   Clinical Impression  Pt admitted with above diagnosis and presents to PT with functional limitations due to deficits listed below (See PT problem list). Pt needs skilled PT to maximize independence and safety to allow discharge to home with family. Pt very motivated to return to independence.      Follow Up Recommendations Home health PT;Supervision - Intermittent    Equipment Recommendations  None recommended by PT    Recommendations for Other Services       Precautions / Restrictions Precautions Precautions: None      Mobility  Bed Mobility Overal bed mobility: Needs Assistance Bed Mobility: Supine to Sit     Supine to sit: Supervision;HOB elevated     General bed mobility comments: Incr time and effort but no physical assist  Transfers Overall transfer level: Needs assistance Equipment used: 4-wheeled walker Transfers: Sit to/from Stand Sit to Stand: Min guard         General transfer comment: Assist for safety/lines  Ambulation/Gait Ambulation/Gait assistance: Min guard Gait Distance (Feet): 175 Feet Assistive device: 4-wheeled walker Gait Pattern/deviations: Step-through pattern;Decreased stride length Gait velocity: decr Gait velocity interpretation: 1.31 - 2.62 ft/sec, indicative of limited community ambulator General Gait Details: Assist for Armed forces logistics/support/administrative officer    Modified Rankin (Stroke Patients Only)       Balance Overall balance assessment: Needs assistance Sitting-balance support: No upper extremity supported;Feet supported Sitting balance-Leahy Scale: Normal     Standing balance support: No upper  extremity supported Standing balance-Leahy Scale: Fair                               Pertinent Vitals/Pain Pain Assessment: No/denies pain    Home Living Family/patient expects to be discharged to:: Private residence Living Arrangements: Spouse/significant other Available Help at Discharge: Family Type of Home: House Home Access: Level entry     Home Layout: Multi-level;Able to live on main level with bedroom/bathroom Home Equipment: Walker - 4 wheels;Cane - single point Additional Comments: Uses O2 at night and during naps    Prior Function Level of Independence: Independent with assistive device(s)         Comments: Uses cane or rollator. Denies falls in last 6 months     Hand Dominance        Extremity/Trunk Assessment   Upper Extremity Assessment Upper Extremity Assessment: Generalized weakness    Lower Extremity Assessment Lower Extremity Assessment: Generalized weakness       Communication   Communication: HOH  Cognition Arousal/Alertness: Awake/alert Behavior During Therapy: WFL for tasks assessed/performed Overall Cognitive Status: Within Functional Limits for tasks assessed                                        General Comments General comments (skin integrity, edema, etc.): Pt on 3L O2 on arrival with SpO2 98%. Removed O2 and SpO2 to 89%. Replaced O2 with SpO2 96% after amb    Exercises     Assessment/Plan    PT  Assessment Patient needs continued PT services  PT Problem List Decreased strength;Decreased activity tolerance;Decreased balance;Decreased mobility;Obesity       PT Treatment Interventions DME instruction;Gait training;Functional mobility training;Therapeutic activities;Therapeutic exercise;Balance training;Patient/family education    PT Goals (Current goals can be found in the Care Plan section)  Acute Rehab PT Goals Patient Stated Goal: return home PT Goal Formulation: With patient Time For Goal  Achievement: 12/15/19 Potential to Achieve Goals: Good    Frequency Min 3X/week   Barriers to discharge        Co-evaluation               AM-PAC PT "6 Clicks" Mobility  Outcome Measure Help needed turning from your back to your side while in a flat bed without using bedrails?: None Help needed moving from lying on your back to sitting on the side of a flat bed without using bedrails?: A Little Help needed moving to and from a bed to a chair (including a wheelchair)?: A Little Help needed standing up from a chair using your arms (e.g., wheelchair or bedside chair)?: A Little Help needed to walk in hospital room?: A Little Help needed climbing 3-5 steps with a railing? : A Little 6 Click Score: 19    End of Session Equipment Utilized During Treatment: Gait belt;Oxygen Activity Tolerance: Patient tolerated treatment well Patient left: in chair;with call bell/phone within reach;with family/visitor present Nurse Communication: Mobility status PT Visit Diagnosis: Unsteadiness on feet (R26.81);Muscle weakness (generalized) (M62.81)    Time: 0370-9643 PT Time Calculation (min) (ACUTE ONLY): 33 min   Charges:   PT Evaluation $PT Eval Moderate Complexity: 1 Mod PT Treatments $Gait Training: 8-22 mins        [place] Pager [phone] Office [place] 12/08/2019, 5:26 PM

## 2019-12-09 LAB — GLUCOSE, CAPILLARY
Glucose-Capillary: 110 mg/dL — ABNORMAL HIGH (ref 70–99)
Glucose-Capillary: 147 mg/dL — ABNORMAL HIGH (ref 70–99)

## 2019-12-09 LAB — CBC WITH DIFFERENTIAL/PLATELET
Abs Immature Granulocytes: 0.03 10*3/uL (ref 0.00–0.07)
Basophils Absolute: 0 10*3/uL (ref 0.0–0.1)
Basophils Relative: 0 %
Eosinophils Absolute: 0.1 10*3/uL (ref 0.0–0.5)
Eosinophils Relative: 2 %
HCT: 35 % — ABNORMAL LOW (ref 36.0–46.0)
Hemoglobin: 10.9 g/dL — ABNORMAL LOW (ref 12.0–15.0)
Immature Granulocytes: 0 %
Lymphocytes Relative: 15 %
Lymphs Abs: 1.1 10*3/uL (ref 0.7–4.0)
MCH: 29.4 pg (ref 26.0–34.0)
MCHC: 31.1 g/dL (ref 30.0–36.0)
MCV: 94.3 fL (ref 80.0–100.0)
Monocytes Absolute: 0.8 10*3/uL (ref 0.1–1.0)
Monocytes Relative: 12 %
Neutro Abs: 5.1 10*3/uL (ref 1.7–7.7)
Neutrophils Relative %: 71 %
Platelets: UNDETERMINED 10*3/uL (ref 150–400)
RBC: 3.71 MIL/uL — ABNORMAL LOW (ref 3.87–5.11)
RDW: 13.1 % (ref 11.5–15.5)
WBC: 7.2 10*3/uL (ref 4.0–10.5)
nRBC: 0 % (ref 0.0–0.2)

## 2019-12-09 LAB — BASIC METABOLIC PANEL
Anion gap: 8 (ref 5–15)
BUN: 22 mg/dL (ref 8–23)
CO2: 27 mmol/L (ref 22–32)
Calcium: 8.5 mg/dL — ABNORMAL LOW (ref 8.9–10.3)
Chloride: 96 mmol/L — ABNORMAL LOW (ref 98–111)
Creatinine, Ser: 1.12 mg/dL — ABNORMAL HIGH (ref 0.44–1.00)
GFR calc Af Amer: 51 mL/min — ABNORMAL LOW (ref >60)
GFR calc non Af Amer: 44 mL/min — ABNORMAL LOW (ref >60)
Glucose, Bld: 138 mg/dL — ABNORMAL HIGH (ref 70–99)
Potassium: 5.2 mmol/L — ABNORMAL HIGH (ref 3.5–5.1)
Sodium: 131 mmol/L — ABNORMAL LOW (ref 135–145)

## 2019-12-09 MED ORDER — IPRATROPIUM-ALBUTEROL 0.5-2.5 (3) MG/3ML IN SOLN: 3.0000 mL | RESPIRATORY_TRACT | 0 refills | Status: DC | PRN

## 2019-12-09 MED ORDER — GUAIFENESIN-DM 100-10 MG/5ML PO SYRP: 5.0000 mL | ORAL_SOLUTION | ORAL | 0 refills | Status: DC | PRN

## 2019-12-09 MED ORDER — SACCHAROMYCES BOULARDII 250 MG PO CAPS: 250.0000 mg | ORAL_CAPSULE | Freq: Two times a day (BID) | ORAL | 0 refills | Status: AC

## 2019-12-09 MED ORDER — LEVOFLOXACIN 250 MG PO TABS
250.0000 mg | ORAL_TABLET | Freq: Every day | ORAL | 0 refills | Status: AC
Start: 2019-12-09 — End: 2019-12-14

## 2019-12-09 NOTE — Progress Notes (Signed)
Removed PIV access x 1 and received discharge instructions. Patient understood it well. Home PT was set up by case manager and delivered neb machine. Patient took her all belongings. HS [employer]

## 2019-12-09 NOTE — Progress Notes (Signed)
Physical Therapy Treatment Patient Details Name: [name] MRN: [id number] DOB: January 23, 1931 Today's Date: 12/09/2019    History of Present Illness Pt adm with acute on chronic respiratory failure with hypoxi due to PNA and copd exacerbation. PMH - DM, HTN, diastolic chf, copd, obesity.     PT Comments    Pt noted to have some increased work of breathing and desaturation to high 80s when ambulating on RA, however she reports this is her baseline. Pt is able to converse during ambulation and walk household distances at this time. Pt reports feeling close to her baseline. Pt will continue to benefit from acute PT services to further improve activity tolerance. PT continues to recommend HHPT services at this time.   Follow Up Recommendations  Home health PT;Supervision - Intermittent     Equipment Recommendations  None recommended by PT    Recommendations for Other Services       Precautions / Restrictions Precautions Precautions: None Restrictions Weight Bearing Restrictions: No    Mobility  Bed Mobility               General bed mobility comments: pt received sitting in arm chair and left in same position  Transfers Overall transfer level: Needs assistance Equipment used: 4-wheeled walker Transfers: Sit to/from Stand Sit to Stand: Min guard            Ambulation/Gait Ambulation/Gait assistance: Supervision Gait Distance (Feet): 130 Feet Assistive device: 4-wheeled walker Gait Pattern/deviations: Step-through pattern;Trunk flexed Gait velocity: decr Gait velocity interpretation: 1.31 - 2.62 ft/sec, indicative of limited community ambulator General Gait Details: pt with slowed step through gait with slight increase in trunk flexion, otherwise no significant balance or gait deviations   Stairs             Wheelchair Mobility    Modified Rankin (Stroke Patients Only)       Balance Overall balance assessment: Needs assistance Sitting-balance  support: No upper extremity supported;Feet supported Sitting balance-Leahy Scale: Good     Standing balance support: No upper extremity supported Standing balance-Leahy Scale: Fair                              Cognition Arousal/Alertness: Awake/alert Behavior During Therapy: WFL for tasks assessed/performed Overall Cognitive Status: Within Functional Limits for tasks assessed                                        Exercises      General Comments General comments (skin integrity, edema, etc.): pt on RA, monitor notes desat to 82% with ambulation while gripping Rollator however PT feels this may be inaccurate reading. Pt does demonstrate some increased work of breathing but is still able to converse with PT. Upon completing walk without grip of walker pt initially saturating from 86-88%, increasing back to mid 90s shortly after sitting      Pertinent Vitals/Pain Pain Assessment: No/denies pain    Home Living                      Prior Function            PT Goals (current goals can now be found in the care plan section) Acute Rehab PT Goals Patient Stated Goal: return home Progress towards PT goals: Progressing toward goals    Frequency  Min 3X/week      PT Plan Current plan remains appropriate    Co-evaluation              AM-PAC PT "6 Clicks" Mobility   Outcome Measure  Help needed turning from your back to your side while in a flat bed without using bedrails?: None Help needed moving from lying on your back to sitting on the side of a flat bed without using bedrails?: A Little Help needed moving to and from a bed to a chair (including a wheelchair)?: A Little Help needed standing up from a chair using your arms (e.g., wheelchair or bedside chair)?: A Little Help needed to walk in hospital room?: A Little Help needed climbing 3-5 steps with a railing? : A Little 6 Click Score: 19    End of Session Equipment  Utilized During Treatment: Oxygen Activity Tolerance: Patient tolerated treatment well Patient left: in chair;with call bell/phone within reach Nurse Communication: Mobility status PT Visit Diagnosis: Unsteadiness on feet (R26.81);Muscle weakness (generalized) (M62.81)     Time: 4709-2957 PT Time Calculation (min) (ACUTE ONLY): 15 min  Charges:  $Gait Training: 8-22 mins                     Zenaida Niece, PT, DPT Acute Rehabilitation Pager: [phone]    Zenaida Niece 12/09/2019, 10:05 AM

## 2019-12-09 NOTE — Discharge Summary (Signed)
Physician Discharge Summary  [name] UUV:[id number] DOB: 1930/08/14 DOA: 12/06/2019  PCP: [name], DO  Admit date: 12/06/2019 Discharge date: 12/09/2019  Admitted From: Home Discharge disposition: Home with PT, DME   Code Status: Full Code  Diet Recommendation: Cardiac/diabetic diet  Discharge Diagnosis:   Principal Problem:   CAP (community acquired pneumonia) Active Problems:   Pulmonary hypertension ([place])   Chronic diastolic CHF (congestive heart failure) (HCC)   Diabetes mellitus ([place])   Obesity, diabetes, and hypertension syndrome (HCC)   CKD (chronic kidney disease) stage 3, GFR 30-59 ml/min   Essential hypertension   History of Present Illness / Brief narrative:  [name] is a 86 y.o. female with PMH of DM2, HTN, HLD, diastolic CHF, COPD on 3 L at home, impaired mobility who lives at home with her husband. Patient presented to the ED on 12/06/2019 with complaint of progressively worsening shortness of breath and weakness for last few days associated with low-grade temperature and productive cough.  In the ED, patient had temperature of 99.6, blood pressure 165/63, oxygen saturation 83% on 3 L and improved to 92% on 4 L. CT angio of chest showed nodular and ground-glass peribronchial opacity in the superior segment of the left lower lobe suspicious for developing bronchopneumonia. COVID-19 PCR negative. Patient was admitted to hospitalist service for further evaluation management.  Hospital Course:  Community-acquired pneumonia Acute exacerbation of COPD Acute on chronic respiratory failure with hypoxia  -presented with worsening shortness of breath, productive cough, low-grade fever -CT chest with developing bronchopneumonia and left lower lobe.  -Currently on IV Levaquin. (QTC 441 ms.) -Clinically improving.  Okay to discharge home today on 5 more days of oral Levaquin 250 mg daily (renally adjusted). -Also continue Mucinex, incentive spirometry  at home. -Patient states he has a nebulizer machine at home but it is not working.  I will order for nebulizer at discharge. -Continue 2 L oxygen by nasal cannula at baseline. -Evaluated by speech therapy.  Minimal risk of aspiration.  Cardiovascular issues:  Chronic diastolic CHF, essential hypertension, hyperlipidemia -Home meds include amlodipine 2.5 mg daily, Imdur 60 mg daily, telmisartan 80 mg daily and aspirin 81 mg daily. -Continue amlodipine and Imdur at home.  I would keep ARB on hold because of high potassium level till patient sees a primary care provider.  Acute hyponatremia -Sodium level down to 131 today. Slowly improving. Recent Labs  Lab 12/06/19 1206 12/08/19 0022 12/09/19 0052  NA 135 130* 131*   Acute hyperkalemia -Potassium level elevated to 5.4 yesterday. Down to 5.2 today.  -Not on potassium supplement.  ARB  has been kept on hold.  Continue to monitor potassium.  Diabetes mellitus type 2 - A1c 6.7 on 8/12 -Home meds include Victoza 0.6 mg daily, Metformin 500 mg daily -Resume the same post discharge.  Obesity - Body mass index is 38.62 kg/m. Patient has been advised to make an attempt to improve diet and exercise patterns to aid in weight loss.  Chronic kidney disease stage 3a: BUN/creatinine at baseline. Recent Labs    02/26/19 2024 02/27/19 0716 02/28/19 0418 12/06/19 1206 12/08/19 0022 12/09/19 0052  CREATININE 1.31* 1.18* 1.10* 1.02* 1.19* 1.12*    Mobility: Uses walker at home.   PT eval obtained.  Home health PT recommended Code Status:  Code Status: Full Code   Wound care:    Subjective:  Seen and examined this morning.  Pleasant elderly Caucasian female.  Sitting up in chair.  Not in distress.  Feels better.  Discharge Exam:   Vitals:   12/08/19 2326 12/09/19 0416 12/09/19 0742 12/09/19 1100  BP: (!) 138/59 133/63 (!) 141/64 139/73  Pulse: 80 80 94 (!) 102  Resp: (!) 21 19 19  (!) 22  Temp: 98.4 F (36.9 C) 98.4 F (36.9  C) 98.7 F (37.1 C) 99.2 F (37.3 C)  TempSrc: Oral Oral Oral Oral  SpO2: 98% 99% 96% 95%  Weight:      Height:        Body mass index is 38.62 kg/m.  General exam: Appears calm and comfortable.  Not in physical distress Skin: No rashes, lesions or ulcers. HEENT: Atraumatic, normocephalic, supple neck, no obvious bleeding Lungs: Clear to auscultation bilaterally CVS: Regular rate and rhythm, no murmur GI/Abd soft, nontender, nondistended, bowel sound present CNS: Alert, awake, oriented x3 Psychiatry: Mood appropriate Extremities: No pedal edema, no calf tenderness  Follow ups:   Discharge Instructions    Diet - low sodium heart healthy   Complete by: As directed    Diet Carb Modified   Complete by: As directed    For home use only DME Nebulizer machine   Complete by: As directed    Patient Small a nebulizer to treat with the following condition: COPD (chronic obstructive pulmonary disease) ([place])   Length of Need: Lifetime   Increase activity slowly   Complete by: As directed       Follow-up Information    [name], DO Follow up.   Specialty: Family Medicine Contact information: [address] [place] [address] [phone]               Recommendations for Outpatient Follow-Up:   1. Follow-up with PCP as an outpatient  Discharge Instructions:  Follow with Primary MD [name], DO in 7 days   Get CBC/BMP checked in next visit within 1 week by PCP or SNF MD ( we routinely change or add medications that can affect your baseline labs and fluid status, therefore we recommend that you get the mentioned basic workup next visit with your PCP, your PCP may decide not to get them or add new tests based on their clinical decision)  On your next visit with your PCP, please Get Medicines reviewed and adjusted.  Please request your PCP  to go over all Hospital Tests and Procedure/Radiological results at the follow up, please get all  Hospital records sent to your Prim MD by signing hospital release before you go home.  Activity: As tolerated with Full fall precautions use walker/cane & assistance as needed  For Heart failure patients - Check your Weight same time everyday, if you gain over 2 pounds, or you develop in leg swelling, experience more shortness of breath or chest pain, call your Primary MD immediately. Follow Cardiac Low Salt Diet and 1.5 lit/day fluid restriction.  If you have smoked or chewed Tobacco in the last 2 yrs please stop smoking, stop any regular Alcohol  and or any Recreational drug use.  If you experience worsening of your admission symptoms, develop shortness of breath, life threatening emergency, suicidal or homicidal thoughts you must seek medical attention immediately by calling 911 or calling your MD immediately  if symptoms less severe.  You Must read complete instructions/literature along with all the possible adverse reactions/side effects for all the Medicines you take and that have been prescribed to you. Take any new Medicines after you have completely understood and accpet all the possible adverse reactions/side effects.  Do not drive, operate heavy machinery, perform activities at heights, swimming or participation in water activities or provide baby sitting services if your were admitted for syncope or siezures until you have seen by Primary MD or a Neurologist and advised to do so again.  Do not drive when taking Pain medications.  Do not take more than prescribed Pain, Sleep and Anxiety Medications  Wear Seat belts while driving.   Please note You were cared for by a hospitalist during your hospital stay. If you have any questions about your discharge medications or the care you received while you were in the hospital after you are discharged, you can call the unit and asked to speak with the hospitalist on call if the hospitalist that took care of you is not available. Once you are  discharged, your primary care physician will handle any further medical issues. Please note that NO REFILLS for any discharge medications will be authorized once you are discharged, as it is imperative that you return to your primary care physician (or establish a relationship with a primary care physician if you do not have one) for your aftercare Small so that they can reassess your need for medications and monitor your lab values.    Allergies as of 12/09/2019      Reactions   Cephalexin Anaphylaxis, Swelling   Invokana [canagliflozin]    Pt does not recall   Lipitor [atorvastatin Calcium] Other (See Comments)   myalgia   Tramadol Nausea Only      Medication List    STOP taking these medications   telmisartan 80 MG tablet Commonly known as: MICARDIS     TAKE these medications   acetaminophen 650 MG CR tablet Commonly known as: TYLENOL Take 650 mg by mouth every 8 (eight) hours as needed for pain.   amLODipine 5 MG tablet Commonly known as: NORVASC Take 0.5 tablets (2.5 mg total) by mouth daily.   aspirin EC 81 MG tablet Take 81 mg by mouth daily.   collagenase ointment Commonly known as: SANTYL Apply topically daily.   guaiFENesin-dextromethorphan 100-10 MG/5ML syrup Commonly known as: ROBITUSSIN DM Take 5 mLs by mouth every 4 (four) hours as needed for cough.   ipratropium-albuterol 0.5-2.5 (3) MG/3ML Soln Commonly known as: DUONEB Take 3 mLs by nebulization every 4 (four) hours as needed.   isosorbide mononitrate 60 MG 24 hr tablet Commonly known as: IMDUR TAKE 1 TABLET DAILY   levofloxacin 250 MG tablet Commonly known as: Levaquin Take 1 tablet (250 mg total) by mouth daily for 5 days.   metFORMIN 1000 MG tablet Commonly known as: GLUCOPHAGE Take 0.5 tablets (500 mg total) by mouth daily.   Rocklatan 0.02-0.005 % Soln Generic drug: Netarsudil-Latanoprost Place 1 drop into both eyes at bedtime.   saccharomyces boulardii 250 MG capsule Commonly known  as: FLORASTOR Take 1 capsule (250 mg total) by mouth 2 (two) times daily for 7 days.   Simbrinza 1-0.2 % Susp Generic drug: Brinzolamide-Brimonidine Apply 1 drop to eye in the morning and at bedtime.   Victoza 18 MG/3ML Sopn Generic drug: liraglutide Inject 0.6 mg into the skin daily.            Durable Medical Equipment  (From admission, onward)         Start     Ordered   12/09/19 0000  For home use only DME Nebulizer machine       Question Answer Comment  Patient Small a nebulizer to treat with the  following condition COPD (chronic obstructive pulmonary disease) ([place])   Length of Need Lifetime      12/09/19 1120          Time coordinating discharge: 35 minutes  The results of significant diagnostics from this hospitalization (including imaging, microbiology, ancillary and laboratory) are listed below for reference.    Procedures and Diagnostic Studies:   CT ANGIO CHEST PE W OR WO CONTRAST  Result Date: 12/06/2019 CLINICAL DATA:  86 year old female with chest pain, shortness of breath. On home oxygen. EXAM: CT ANGIOGRAPHY CHEST WITH CONTRAST TECHNIQUE: Multidetector CT imaging of the chest was performed using the standard protocol during bolus administration of intravenous contrast. Multiplanar CT image reconstructions and MIPs were obtained to evaluate the vascular anatomy. CONTRAST:  55mL OMNIPAQUE IOHEXOL 350 MG/ML SOLN COMPARISON:  Portable chest earlier today.  Chest CT 12/05/2010. FINDINGS: Cardiovascular: Adequate contrast bolus timing in the pulmonary arterial tree. Respiratory motion, especially in the lower lobes. Subsequently, the subsegmental lower lobe branches are not well evaluated. But elsewhere no pulmonary artery filling defect is identified. No cardiomegaly or pericardial effusion. Negative visible aorta aside from atherosclerosis. Evidence of calcified coronary artery atherosclerosis (series 6, image 166). Mediastinum/Nodes: Mild, reactive appearing left  hilar lymph nodes which are increased compared to 2012. No mediastinal lymphadenopathy. Lungs/Pleura: Lower lung volumes compared to 2012. Major airways appear patent. Mild bilateral perihilar atelectasis, with additional nodular and ground-glass peribronchial opacity in the superior segment of the left lower lobe (series 7, image 36). No other acute pulmonary opacity. A small right lung nodule on series 7, image 42 is stable since 2012 and benign. No pleural effusion. Upper Abdomen: Negative visible liver, pancreas, adrenal glands, left kidney and bowel in the upper abdomen. Possible small gastric hiatal hernia. There is also a small hypodense circumscribed round area in the spleen measuring 17 mm on series 5, image 91. This was not included in 2012 but has a benign appearance. Musculoskeletal: No acute osseous abnormality identified. Postoperative changes to the proximal right humerus again noted. Chronic but progressed thoracic spine degeneration since 2012. Review of the MIP images confirms the above findings. IMPRESSION: 1. No pulmonary embolus identified, although the subsegmental lower lobe branches are not well evaluated due to respiratory motion. 2. Lower lung volumes with mild atelectasis, but additional nodular and ground-glass peribronchial opacity in the superior segment of the left lower lobe suspicious for developing bronchopneumonia. Mild reactive appearing left hilar lymph nodes. No pleural effusion. 3. Small benign appearing hypodense area in the spleen, such as splenic hemangioma. Calcified coronary artery and Aortic Atherosclerosis (ICD10-I70.0). Electronically Signed   By: Genevie Ann M.D.   On: 12/06/2019 23:57   DG Chest Portable 1 View  Result Date: 12/06/2019 CLINICAL DATA:  Shortness of breath. EXAM: PORTABLE CHEST 1 VIEW COMPARISON:  Prior chest radiograph 11/05/2017 and earlier FINDINGS: Mild cardiomegaly, unchanged. Aortic atherosclerosis. Minimal linear atelectasis and/or scarring  within the lateral left lung base. No appreciable airspace consolidation or pulmonary edema. No evidence of pleural effusion or pneumothorax. No acute bony abnormality identified. IMPRESSION: Minimal linear atelectasis and/or scarring within the lateral left lung base. No appreciable airspace consolidation or pulmonary edema. Mild cardiomegaly, unchanged. Aortic Atherosclerosis (ICD10-I70.0). Electronically Signed   By: Kellie Simmering DO   On: 12/06/2019 12:44     Labs:   Basic Metabolic Panel: Recent Labs  Lab 12/06/19 1206 12/06/19 1206 12/08/19 0022 12/09/19 0052  NA 135  --  130* 131*  K 4.5   < > 5.4*  5.2*  CL 97*  --  97* 96*  CO2 28  --  25 27  GLUCOSE 170*  --  116* 138*  BUN 19  --  19 22  CREATININE 1.02*  --  1.19* 1.12*  CALCIUM 9.1  --  8.7* 8.5*   < > = values in this interval not displayed.   GFR Estimated Creatinine Clearance: 41.2 mL/min (A) (by C-G formula based on SCr of 1.12 mg/dL (H)). Liver Function Tests: No results for input(s): AST, ALT, ALKPHOS, BILITOT, PROT, ALBUMIN in the last 168 hours. No results for input(s): LIPASE, AMYLASE in the last 168 hours. No results for input(s): AMMONIA in the last 168 hours. Coagulation profile No results for input(s): INR, PROTIME in the last 168 hours.  CBC: Recent Labs  Lab 12/06/19 1206 12/09/19 0442  WBC 6.1 7.2  NEUTROABS  --  5.1  HGB 10.8* 10.9*  HCT 35.6* 35.0*  MCV 96.0 94.3  PLT PLATELETS APPEAR ADEQUATE PLATELET CLUMPS NOTED ON SMEAR, UNABLE TO ESTIMATE   Cardiac Enzymes: No results for input(s): CKTOTAL, CKMB, CKMBINDEX, TROPONINI in the last 168 hours. BNP: Invalid input(s): POCBNP CBG: Recent Labs  Lab 12/08/19 1116 12/08/19 1643 12/08/19 2127 12/09/19 0637 12/09/19 1104  GLUCAP 149* 168* 147* 110* 147*   D-Dimer No results for input(s): DDIMER in the last 72 hours. Hgb A1c Recent Labs    12/07/19 0316  HGBA1C 6.7*   Lipid Profile No results for input(s): CHOL, HDL, LDLCALC,  TRIG, CHOLHDL, LDLDIRECT in the last 72 hours. Thyroid function studies No results for input(s): TSH, T4TOTAL, T3FREE, THYROIDAB in the last 72 hours.  Invalid input(s): FREET3 Anemia work up No results for input(s): VITAMINB12, FOLATE, FERRITIN, TIBC, IRON, RETICCTPCT in the last 72 hours. Microbiology Recent Results (from the past 240 hour(s))  SARS Coronavirus 2 by RT PCR (hospital order, performed in [facility] hospital lab) Nasopharyngeal Nasopharyngeal Swab     Status: None   Collection Time: 12/06/19 12:01 PM   Specimen: Nasopharyngeal Swab  Result Value Ref Range Status   SARS Coronavirus 2 NEGATIVE NEGATIVE Final    Comment: (NOTE) SARS-CoV-2 target nucleic acids are NOT DETECTED.  The SARS-CoV-2 RNA is generally detectable in upper and lower respiratory specimens during the acute phase of infection. The lowest concentration of SARS-CoV-2 viral copies this assay can detect is 250 copies / mL. A negative result does not preclude SARS-CoV-2 infection and should not be used as the sole basis for treatment or other patient management decisions.  A negative result may occur with improper specimen collection / handling, submission of specimen other than nasopharyngeal swab, presence of viral mutation(s) within the areas targeted by this assay, and inadequate number of viral copies (<250 copies / mL). A negative result must be combined with clinical observations, patient history, and epidemiological information.  Fact Sheet for Patients:   StrictlyIdeas.no  Fact Sheet for Healthcare Providers: BankingDealers.co.za  This test is not yet approved or  cleared by the [place] FDA and has been authorized for detection and/or diagnosis of SARS-CoV-2 by FDA under an Emergency Use Authorization (EUA).  This EUA will remain in effect (meaning this test can be used) for the duration of the COVID-19 declaration under Section 564(b)(1)  of the Act, 21 U.S.C. section 360bbb-3(b)(1), unless the authorization is terminated or revoked sooner.  Performed at [facility], [place] [address], [place], [place] [address]   MRSA PCR Screening     Status: None   Collection  Time: 12/07/19  6:15 AM   Specimen: Nasal Mucosa; Nasopharyngeal  Result Value Ref Range Status   MRSA by PCR NEGATIVE NEGATIVE Final    Comment:        The GeneXpert MRSA Assay (FDA approved for NASAL specimens only), is one component of a comprehensive MRSA colonization surveillance program. It is not intended to diagnose MRSA infection nor to guide or monitor treatment for MRSA infections. Performed at [facility], [place] [address] [place], [place] [address]   Culture, blood (routine x 2) Call MD if unable to obtain prior to antibiotics being given     Status: None (Preliminary result)   Collection Time: 12/07/19  6:36 AM   Specimen: BLOOD RIGHT HAND  Result Value Ref Range Status   Specimen Description BLOOD RIGHT HAND  Final   Special Requests   Final    BOTTLES DRAWN AEROBIC AND ANAEROBIC Blood Culture adequate volume   Culture   Final    NO GROWTH 1 DAY Performed at [facility], [place] [address] [place], [place] [address]    Report Status PENDING  Incomplete  Culture, sputum-assessment     Status: None   Collection Time: 12/07/19  9:42 PM   Specimen: Expectorated Sputum  Result Value Ref Range Status   Specimen Description EXPECTORATED SPUTUM  Final   Special Requests NONE  Final   Sputum evaluation   Final    THIS SPECIMEN IS ACCEPTABLE FOR SPUTUM CULTURE Performed at [facility], [place] [address] [place], [place] [address]    Report Status 12/08/2019 FINAL  Final  Culture, respiratory     Status: None (Preliminary result)   Collection Time: 12/07/19  9:42 PM  Result Value Ref Range Status   Specimen Description EXPECTORATED SPUTUM  Final   Special Requests NONE Reflexed from [id number]  Final   Gram Stain   Final      ABUNDANT WBC PRESENT, PREDOMINANTLY PMN ABUNDANT GRAM POSITIVE COCCI RARE GRAM POSITIVE RODS    Culture   Final    CULTURE REINCUBATED FOR BETTER GROWTH Performed at [facility], [place] [address], [place], [place] [address]    Report Status PENDING  Incomplete     Signed: Marlowe Aschoff [place]  [facility] 12/09/2019, 11:24 AM

## 2019-12-09 NOTE — TOC Transition Note (Addendum)
Transition of Care [facility]) - CM/SW Discharge Note   Patient Details  Name: [name] MRN: [id number] Date of Birth: 08/09/1930  [facility] CM/SW Contact:  Carles Collet, RN Phone Number: 12/09/2019, 12:29 PM   Clinical Narrative:    From home w spouse. Spoke w patient, she would like [place] PT services.  [facility] accepting referral  Nebulizer to be delivered to the room.      Final next level of care: [place] Barriers to Discharge: No Barriers Identified   Patient Goals and CMS Choice Patient states their goals for this hospitalization and ongoing recovery are:: to go home CMS Medicare.gov Compare Post Acute Care list provided to:: Patient Choice offered to / list presented to : Patient  Discharge Placement                       Discharge Plan and Services                          HH Arranged: PT [facility] Agency: [place] ([facility]) Date [place]: 12/09/19 Time [place]: 1228 Representative spoke with at [place]: [place] ([place]) Interventions     Readmission Risk Interventions No flowsheet data found.

## 2019-12-10 DIAGNOSIS — J189 Pneumonia, unspecified organism: Secondary | ICD-10-CM | POA: Diagnosis not present

## 2019-12-10 DIAGNOSIS — E785 Hyperlipidemia, unspecified: Secondary | ICD-10-CM | POA: Diagnosis not present

## 2019-12-10 DIAGNOSIS — I272 Pulmonary hypertension, unspecified: Secondary | ICD-10-CM | POA: Diagnosis not present

## 2019-12-10 DIAGNOSIS — Z9181 History of falling: Secondary | ICD-10-CM | POA: Diagnosis not present

## 2019-12-10 DIAGNOSIS — Z9049 Acquired absence of other specified parts of digestive tract: Secondary | ICD-10-CM | POA: Diagnosis not present

## 2019-12-10 DIAGNOSIS — E1122 Type 2 diabetes mellitus with diabetic chronic kidney disease: Secondary | ICD-10-CM | POA: Diagnosis not present

## 2019-12-10 DIAGNOSIS — E875 Hyperkalemia: Secondary | ICD-10-CM | POA: Diagnosis not present

## 2019-12-10 DIAGNOSIS — I130 Hypertensive heart and chronic kidney disease with heart failure and stage 1 through stage 4 chronic kidney disease, or unspecified chronic kidney disease: Secondary | ICD-10-CM | POA: Diagnosis not present

## 2019-12-10 DIAGNOSIS — H409 Unspecified glaucoma: Secondary | ICD-10-CM | POA: Diagnosis not present

## 2019-12-10 DIAGNOSIS — Z6838 Body mass index (BMI) 38.0-38.9, adult: Secondary | ICD-10-CM | POA: Diagnosis not present

## 2019-12-10 DIAGNOSIS — Z9981 Dependence on supplemental oxygen: Secondary | ICD-10-CM | POA: Diagnosis not present

## 2019-12-10 DIAGNOSIS — Z7984 Long term (current) use of oral hypoglycemic drugs: Secondary | ICD-10-CM | POA: Diagnosis not present

## 2019-12-10 DIAGNOSIS — I10 Essential (primary) hypertension: Secondary | ICD-10-CM | POA: Diagnosis not present

## 2019-12-10 DIAGNOSIS — Z7982 Long term (current) use of aspirin: Secondary | ICD-10-CM | POA: Diagnosis not present

## 2019-12-10 DIAGNOSIS — J9621 Acute and chronic respiratory failure with hypoxia: Secondary | ICD-10-CM | POA: Diagnosis not present

## 2019-12-10 DIAGNOSIS — N1831 Chronic kidney disease, stage 3a: Secondary | ICD-10-CM | POA: Diagnosis not present

## 2019-12-10 DIAGNOSIS — J441 Chronic obstructive pulmonary disease with (acute) exacerbation: Secondary | ICD-10-CM | POA: Diagnosis not present

## 2019-12-10 DIAGNOSIS — I5032 Chronic diastolic (congestive) heart failure: Secondary | ICD-10-CM | POA: Diagnosis not present

## 2019-12-10 DIAGNOSIS — J440 Chronic obstructive pulmonary disease with acute lower respiratory infection: Secondary | ICD-10-CM | POA: Diagnosis not present

## 2019-12-10 DIAGNOSIS — Z7901 Long term (current) use of anticoagulants: Secondary | ICD-10-CM | POA: Diagnosis not present

## 2019-12-10 LAB — CULTURE, RESPIRATORY W GRAM STAIN: Culture: NORMAL

## 2019-12-12 ENCOUNTER — Other Ambulatory Visit: Payer: Self-pay

## 2019-12-12 DIAGNOSIS — I10 Essential (primary) hypertension: Secondary | ICD-10-CM

## 2019-12-12 LAB — CULTURE, BLOOD (ROUTINE X 2)
Culture: NO GROWTH
Special Requests: ADEQUATE

## 2019-12-12 MED ORDER — AMLODIPINE BESYLATE 5 MG PO TABS: 5.0000 mg | ORAL_TABLET | Freq: Every day | ORAL | 1 refills | Status: DC

## 2019-12-12 NOTE — Telephone Encounter (Signed)
Sent in a new script for Amlodipine 5mg  with 90 day supply. No further questions from patient at this time.

## 2019-12-14 DIAGNOSIS — E1122 Type 2 diabetes mellitus with diabetic chronic kidney disease: Secondary | ICD-10-CM | POA: Diagnosis not present

## 2019-12-14 DIAGNOSIS — G4733 Obstructive sleep apnea (adult) (pediatric): Secondary | ICD-10-CM | POA: Diagnosis not present

## 2019-12-14 DIAGNOSIS — I509 Heart failure, unspecified: Secondary | ICD-10-CM | POA: Diagnosis not present

## 2019-12-14 DIAGNOSIS — F419 Anxiety disorder, unspecified: Secondary | ICD-10-CM | POA: Diagnosis not present

## 2019-12-14 DIAGNOSIS — Z09 Encounter for follow-up examination after completed treatment for conditions other than malignant neoplasm: Secondary | ICD-10-CM | POA: Diagnosis not present

## 2019-12-14 DIAGNOSIS — I10 Essential (primary) hypertension: Secondary | ICD-10-CM | POA: Diagnosis not present

## 2019-12-14 DIAGNOSIS — J189 Pneumonia, unspecified organism: Secondary | ICD-10-CM | POA: Diagnosis not present

## 2019-12-18 DIAGNOSIS — J440 Chronic obstructive pulmonary disease with acute lower respiratory infection: Secondary | ICD-10-CM | POA: Diagnosis not present

## 2019-12-18 DIAGNOSIS — J189 Pneumonia, unspecified organism: Secondary | ICD-10-CM | POA: Diagnosis not present

## 2019-12-18 DIAGNOSIS — J9621 Acute and chronic respiratory failure with hypoxia: Secondary | ICD-10-CM | POA: Diagnosis not present

## 2019-12-18 DIAGNOSIS — J441 Chronic obstructive pulmonary disease with (acute) exacerbation: Secondary | ICD-10-CM | POA: Diagnosis not present

## 2019-12-19 DIAGNOSIS — J440 Chronic obstructive pulmonary disease with acute lower respiratory infection: Secondary | ICD-10-CM | POA: Diagnosis not present

## 2019-12-19 DIAGNOSIS — J9621 Acute and chronic respiratory failure with hypoxia: Secondary | ICD-10-CM | POA: Diagnosis not present

## 2019-12-19 DIAGNOSIS — J441 Chronic obstructive pulmonary disease with (acute) exacerbation: Secondary | ICD-10-CM | POA: Diagnosis not present

## 2019-12-19 DIAGNOSIS — I130 Hypertensive heart and chronic kidney disease with heart failure and stage 1 through stage 4 chronic kidney disease, or unspecified chronic kidney disease: Secondary | ICD-10-CM | POA: Diagnosis not present

## 2019-12-19 DIAGNOSIS — J189 Pneumonia, unspecified organism: Secondary | ICD-10-CM | POA: Diagnosis not present

## 2019-12-19 DIAGNOSIS — I5032 Chronic diastolic (congestive) heart failure: Secondary | ICD-10-CM | POA: Diagnosis not present

## 2019-12-21 DIAGNOSIS — J441 Chronic obstructive pulmonary disease with (acute) exacerbation: Secondary | ICD-10-CM | POA: Diagnosis not present

## 2019-12-21 DIAGNOSIS — J440 Chronic obstructive pulmonary disease with acute lower respiratory infection: Secondary | ICD-10-CM | POA: Diagnosis not present

## 2019-12-21 DIAGNOSIS — J189 Pneumonia, unspecified organism: Secondary | ICD-10-CM | POA: Diagnosis not present

## 2019-12-21 DIAGNOSIS — I5032 Chronic diastolic (congestive) heart failure: Secondary | ICD-10-CM | POA: Diagnosis not present

## 2019-12-21 DIAGNOSIS — I130 Hypertensive heart and chronic kidney disease with heart failure and stage 1 through stage 4 chronic kidney disease, or unspecified chronic kidney disease: Secondary | ICD-10-CM | POA: Diagnosis not present

## 2019-12-21 DIAGNOSIS — J9621 Acute and chronic respiratory failure with hypoxia: Secondary | ICD-10-CM | POA: Diagnosis not present

## 2019-12-25 DIAGNOSIS — I5032 Chronic diastolic (congestive) heart failure: Secondary | ICD-10-CM | POA: Diagnosis not present

## 2019-12-25 DIAGNOSIS — J189 Pneumonia, unspecified organism: Secondary | ICD-10-CM | POA: Diagnosis not present

## 2019-12-25 DIAGNOSIS — J440 Chronic obstructive pulmonary disease with acute lower respiratory infection: Secondary | ICD-10-CM | POA: Diagnosis not present

## 2019-12-25 DIAGNOSIS — J9621 Acute and chronic respiratory failure with hypoxia: Secondary | ICD-10-CM | POA: Diagnosis not present

## 2019-12-25 DIAGNOSIS — I130 Hypertensive heart and chronic kidney disease with heart failure and stage 1 through stage 4 chronic kidney disease, or unspecified chronic kidney disease: Secondary | ICD-10-CM | POA: Diagnosis not present

## 2019-12-25 DIAGNOSIS — J441 Chronic obstructive pulmonary disease with (acute) exacerbation: Secondary | ICD-10-CM | POA: Diagnosis not present

## 2020-01-05 DIAGNOSIS — J440 Chronic obstructive pulmonary disease with acute lower respiratory infection: Secondary | ICD-10-CM | POA: Diagnosis not present

## 2020-01-05 DIAGNOSIS — J189 Pneumonia, unspecified organism: Secondary | ICD-10-CM | POA: Diagnosis not present

## 2020-01-05 DIAGNOSIS — I130 Hypertensive heart and chronic kidney disease with heart failure and stage 1 through stage 4 chronic kidney disease, or unspecified chronic kidney disease: Secondary | ICD-10-CM | POA: Diagnosis not present

## 2020-01-05 DIAGNOSIS — J9621 Acute and chronic respiratory failure with hypoxia: Secondary | ICD-10-CM | POA: Diagnosis not present

## 2020-01-05 DIAGNOSIS — J441 Chronic obstructive pulmonary disease with (acute) exacerbation: Secondary | ICD-10-CM | POA: Diagnosis not present

## 2020-01-05 DIAGNOSIS — I5032 Chronic diastolic (congestive) heart failure: Secondary | ICD-10-CM | POA: Diagnosis not present

## 2020-01-09 DIAGNOSIS — Z9981 Dependence on supplemental oxygen: Secondary | ICD-10-CM | POA: Diagnosis not present

## 2020-01-09 DIAGNOSIS — E1122 Type 2 diabetes mellitus with diabetic chronic kidney disease: Secondary | ICD-10-CM | POA: Diagnosis not present

## 2020-01-09 DIAGNOSIS — H409 Unspecified glaucoma: Secondary | ICD-10-CM | POA: Diagnosis not present

## 2020-01-09 DIAGNOSIS — Z6838 Body mass index (BMI) 38.0-38.9, adult: Secondary | ICD-10-CM | POA: Diagnosis not present

## 2020-01-09 DIAGNOSIS — N1831 Chronic kidney disease, stage 3a: Secondary | ICD-10-CM | POA: Diagnosis not present

## 2020-01-09 DIAGNOSIS — Z7982 Long term (current) use of aspirin: Secondary | ICD-10-CM | POA: Diagnosis not present

## 2020-01-09 DIAGNOSIS — J440 Chronic obstructive pulmonary disease with acute lower respiratory infection: Secondary | ICD-10-CM | POA: Diagnosis not present

## 2020-01-09 DIAGNOSIS — I5032 Chronic diastolic (congestive) heart failure: Secondary | ICD-10-CM | POA: Diagnosis not present

## 2020-01-09 DIAGNOSIS — J189 Pneumonia, unspecified organism: Secondary | ICD-10-CM | POA: Diagnosis not present

## 2020-01-09 DIAGNOSIS — Z9049 Acquired absence of other specified parts of digestive tract: Secondary | ICD-10-CM | POA: Diagnosis not present

## 2020-01-09 DIAGNOSIS — J9621 Acute and chronic respiratory failure with hypoxia: Secondary | ICD-10-CM | POA: Diagnosis not present

## 2020-01-09 DIAGNOSIS — I130 Hypertensive heart and chronic kidney disease with heart failure and stage 1 through stage 4 chronic kidney disease, or unspecified chronic kidney disease: Secondary | ICD-10-CM | POA: Diagnosis not present

## 2020-01-09 DIAGNOSIS — J441 Chronic obstructive pulmonary disease with (acute) exacerbation: Secondary | ICD-10-CM | POA: Diagnosis not present

## 2020-01-09 DIAGNOSIS — Z7984 Long term (current) use of oral hypoglycemic drugs: Secondary | ICD-10-CM | POA: Diagnosis not present

## 2020-01-09 DIAGNOSIS — Z7901 Long term (current) use of anticoagulants: Secondary | ICD-10-CM | POA: Diagnosis not present

## 2020-01-09 DIAGNOSIS — E785 Hyperlipidemia, unspecified: Secondary | ICD-10-CM | POA: Diagnosis not present

## 2020-01-09 DIAGNOSIS — I272 Pulmonary hypertension, unspecified: Secondary | ICD-10-CM | POA: Diagnosis not present

## 2020-01-09 DIAGNOSIS — E875 Hyperkalemia: Secondary | ICD-10-CM | POA: Diagnosis not present

## 2020-01-09 DIAGNOSIS — Z9181 History of falling: Secondary | ICD-10-CM | POA: Diagnosis not present

## 2020-01-17 DIAGNOSIS — J441 Chronic obstructive pulmonary disease with (acute) exacerbation: Secondary | ICD-10-CM | POA: Diagnosis not present

## 2020-01-17 DIAGNOSIS — J189 Pneumonia, unspecified organism: Secondary | ICD-10-CM | POA: Diagnosis not present

## 2020-01-17 DIAGNOSIS — I5032 Chronic diastolic (congestive) heart failure: Secondary | ICD-10-CM | POA: Diagnosis not present

## 2020-01-17 DIAGNOSIS — J9621 Acute and chronic respiratory failure with hypoxia: Secondary | ICD-10-CM | POA: Diagnosis not present

## 2020-01-17 DIAGNOSIS — J440 Chronic obstructive pulmonary disease with acute lower respiratory infection: Secondary | ICD-10-CM | POA: Diagnosis not present

## 2020-01-17 DIAGNOSIS — I130 Hypertensive heart and chronic kidney disease with heart failure and stage 1 through stage 4 chronic kidney disease, or unspecified chronic kidney disease: Secondary | ICD-10-CM | POA: Diagnosis not present

## 2020-01-18 ENCOUNTER — Ambulatory Visit: Payer: Medicare Other | Attending: Internal Medicine

## 2020-01-18 DIAGNOSIS — G4761 Periodic limb movement disorder: Secondary | ICD-10-CM | POA: Insufficient documentation

## 2020-01-18 DIAGNOSIS — J984 Other disorders of lung: Secondary | ICD-10-CM | POA: Insufficient documentation

## 2020-01-18 DIAGNOSIS — I509 Heart failure, unspecified: Secondary | ICD-10-CM | POA: Insufficient documentation

## 2020-01-18 DIAGNOSIS — G4736 Sleep related hypoventilation in conditions classified elsewhere: Secondary | ICD-10-CM | POA: Insufficient documentation

## 2020-01-18 DIAGNOSIS — G4733 Obstructive sleep apnea (adult) (pediatric): Secondary | ICD-10-CM | POA: Insufficient documentation

## 2020-02-07 DIAGNOSIS — I350 Nonrheumatic aortic (valve) stenosis: Secondary | ICD-10-CM | POA: Diagnosis not present

## 2020-02-07 DIAGNOSIS — E1140 Type 2 diabetes mellitus with diabetic neuropathy, unspecified: Secondary | ICD-10-CM | POA: Diagnosis not present

## 2020-02-07 DIAGNOSIS — I5030 Unspecified diastolic (congestive) heart failure: Secondary | ICD-10-CM | POA: Diagnosis not present

## 2020-03-01 DIAGNOSIS — H401131 Primary open-angle glaucoma, bilateral, mild stage: Secondary | ICD-10-CM | POA: Diagnosis not present

## 2020-03-01 DIAGNOSIS — H0100A Unspecified blepharitis right eye, upper and lower eyelids: Secondary | ICD-10-CM | POA: Diagnosis not present

## 2020-03-01 DIAGNOSIS — H0100B Unspecified blepharitis left eye, upper and lower eyelids: Secondary | ICD-10-CM | POA: Diagnosis not present

## 2020-03-01 DIAGNOSIS — E103291 Type 1 diabetes mellitus with mild nonproliferative diabetic retinopathy without macular edema, right eye: Secondary | ICD-10-CM | POA: Diagnosis not present

## 2020-03-07 DIAGNOSIS — I350 Nonrheumatic aortic (valve) stenosis: Secondary | ICD-10-CM | POA: Diagnosis not present

## 2020-03-07 DIAGNOSIS — E1140 Type 2 diabetes mellitus with diabetic neuropathy, unspecified: Secondary | ICD-10-CM | POA: Diagnosis not present

## 2020-03-07 DIAGNOSIS — J449 Chronic obstructive pulmonary disease, unspecified: Secondary | ICD-10-CM | POA: Diagnosis not present

## 2020-03-07 DIAGNOSIS — E785 Hyperlipidemia, unspecified: Secondary | ICD-10-CM | POA: Diagnosis not present

## 2020-03-07 DIAGNOSIS — E1122 Type 2 diabetes mellitus with diabetic chronic kidney disease: Secondary | ICD-10-CM | POA: Diagnosis not present

## 2020-03-07 DIAGNOSIS — I340 Nonrheumatic mitral (valve) insufficiency: Secondary | ICD-10-CM | POA: Diagnosis not present

## 2020-03-07 DIAGNOSIS — I10 Essential (primary) hypertension: Secondary | ICD-10-CM | POA: Diagnosis not present

## 2020-03-07 DIAGNOSIS — I509 Heart failure, unspecified: Secondary | ICD-10-CM | POA: Diagnosis not present

## 2020-03-07 DIAGNOSIS — I272 Pulmonary hypertension, unspecified: Secondary | ICD-10-CM | POA: Diagnosis not present

## 2020-03-07 DIAGNOSIS — G4733 Obstructive sleep apnea (adult) (pediatric): Secondary | ICD-10-CM | POA: Diagnosis not present

## 2020-03-29 DIAGNOSIS — X32XXXD Exposure to sunlight, subsequent encounter: Secondary | ICD-10-CM | POA: Diagnosis not present

## 2020-03-29 DIAGNOSIS — B0089 Other herpesviral infection: Secondary | ICD-10-CM | POA: Diagnosis not present

## 2020-03-29 DIAGNOSIS — L570 Actinic keratosis: Secondary | ICD-10-CM | POA: Diagnosis not present

## 2020-03-29 DIAGNOSIS — D225 Melanocytic nevi of trunk: Secondary | ICD-10-CM | POA: Diagnosis not present

## 2020-03-29 DIAGNOSIS — D0439 Carcinoma in situ of skin of other parts of face: Secondary | ICD-10-CM | POA: Diagnosis not present

## 2020-04-15 DIAGNOSIS — M7502 Adhesive capsulitis of left shoulder: Secondary | ICD-10-CM | POA: Diagnosis not present

## 2020-05-15 ENCOUNTER — Ambulatory Visit: Payer: TRICARE For Life (TFL) | Admitting: Cardiology

## 2020-05-22 ENCOUNTER — Other Ambulatory Visit: Payer: Self-pay

## 2020-05-22 ENCOUNTER — Ambulatory Visit: Payer: Medicare Other | Admitting: Cardiology

## 2020-05-22 ENCOUNTER — Encounter: Payer: Self-pay | Admitting: Cardiology

## 2020-05-22 VITALS — BP 147/74 | HR 96 | Temp 98.0°F | Resp 16 | Ht 64.0 in | Wt 206.0 lb

## 2020-05-22 DIAGNOSIS — I340 Nonrheumatic mitral (valve) insufficiency: Secondary | ICD-10-CM | POA: Diagnosis not present

## 2020-05-22 DIAGNOSIS — I10 Essential (primary) hypertension: Secondary | ICD-10-CM | POA: Diagnosis not present

## 2020-05-22 DIAGNOSIS — I5032 Chronic diastolic (congestive) heart failure: Secondary | ICD-10-CM | POA: Diagnosis not present

## 2020-05-22 DIAGNOSIS — G4734 Idiopathic sleep related nonobstructive alveolar hypoventilation: Secondary | ICD-10-CM | POA: Diagnosis not present

## 2020-05-22 MED ORDER — AMLODIPINE BESYLATE 10 MG PO TABS: 10.0000 mg | ORAL_TABLET | Freq: Every day | ORAL | 3 refills | Status: DC

## 2020-05-22 MED ORDER — HYDROCHLOROTHIAZIDE 25 MG PO TABS: 25.0000 mg | ORAL_TABLET | Freq: Every day | ORAL | 2 refills | Status: DC

## 2020-05-22 NOTE — Progress Notes (Signed)
Primary Physician/Referring:  [name], DO  Patient ID: [name], female    DOB: 01-30-1931, 87 y.o.   MRN: [id number]  Chief Complaint  Patient presents with  . Chronic diastolic CHF   . Follow-up  . Hypertension  . Shortness of Breath   HPI:    HPI: [name]  is a 87 y.o. female  aortic stenosis, hypertension, diabetes mellitus, morbid obesity, and sleep apnea. She is here for follow-up of aortic stenosis, hypertension, and shortness of breath. She reports no symptoms of shortness of breath, orthopnea, chest pain, light-headedness, or edema. She wears supplemental oxygen at night for sleep apnea as she cannot tolerate CPAP with marked improvement in daytime somnolence and also fatigue.   This is a 9-month office visit for hypertension and chronic diastolic heart failure, states that she continues to have "spells" in which she is woken up from sleep feeling extremely short of breath.  She also has chronic dyspnea on exertion.  Denies associated chest pain.  No leg edema.  No dizziness or syncope.  Past Medical History:  Diagnosis Date  . Cellulitis and abscess of left leg   . CHF (congestive heart failure) ([place])   . COPD (chronic obstructive pulmonary disease) ([place])   . Diabetes mellitus   . Glaucoma   . Hyperlipidemia   . Hypertension    Past Surgical History:  Procedure Laterality Date  . BACK SURGERY    . CARDIAC CATHETERIZATION N/A 12/09/2015   Procedure: Right Heart Cath;  Surgeon: Adrian Prows, MD;  Location: [facility];  Service: Cardiovascular;  Laterality: N/A;  . CHOLECYSTECTOMY  2000  . HERNIA REPAIR  2007  . rupture disk  1970"s  . SHOULDER ARTHROSCOPY WITH ROTATOR CUFF REPAIR  1999   rt shoulder   Social History   Tobacco Use  . Smoking status: Never Smoker  . Smokeless tobacco: Never Used  Substance Use Topics  . Alcohol use: No   Marital Status: Married   ROS  Review of Systems  Constitutional: Positive for malaise/fatigue.   Cardiovascular: Positive for dyspnea on exertion and orthopnea. Negative for chest pain and leg swelling.  Gastrointestinal: Negative for melena.   Objective  Blood pressure (!) 147/74, pulse 96, temperature 98 F (36.7 C), temperature source Temporal, resp. rate 16, height 5\' 4"  (1.626 m), weight 206 lb (93.4 kg), SpO2 94 %. Body mass index is 35.36 kg/m.   Vitals with BMI 05/22/2020 12/09/2019 12/09/2019  Height 5\' 4"  - -  Weight 206 lbs - -  BMI 99991111 - -  Systolic [id number]  Diastolic 74 73 64  Pulse 96 102 94   Physical Exam Constitutional:      Comments: Morbidly obese  Neck:     Thyroid: No thyromegaly.     Comments: Short neck and difficult to evaluate JVP Cardiovascular:     Rate and Rhythm: Normal rate and regular rhythm.     Pulses: Normal pulses and intact distal pulses.          Carotid pulses are 2+ on the right side with bruit and 2+ on the left side with bruit.      Dorsalis pedis pulses are 2+ on the right side and 2+ on the left side.       Posterior tibial pulses are 2+ on the right side and 2+ on the left side.     Heart sounds: Murmur heard.  High-pitched harsh midsystolic murmur is present with a grade  of 2/6 at the upper right sternal border.  Blowing holosystolic murmur of grade 3/6 is also present at the apex. No gallop.      Comments: Femoral and popliteal pulse difficult to feel due to patient's body habitus.  No pedal edema. No JVD.  Pulmonary:     Breath sounds: Normal breath sounds.  Abdominal:     General: Bowel sounds are normal.     Palpations: Abdomen is soft.     Comments: Obese. Pannus present    Radiology: No results found.  Laboratory examination:   Labs 10/17/2019:  Ref Range & Units 10/17/2019  Vitamin B-12 232 - 1,245 pg/mL 364         Ref Range & Units 1 mo ago  Folate >3.0 ng/mL 10.4     Ref Range & Units 10/17/2019 3 yr ago  Total Iron Binding Capacity 250 - 450 ug/dL 271  311   UIBC 118 - 369 ug/dL 222  246 R    Iron 27 - 139 ug/dL 49  65 R   Iron Saturation 15 - 55 % 18      Ref Range & Units 10/17/2019 3 yr ago  Ferritin 15.0 - 150.0 ng/mL 45  72 R          CMP Latest Ref Rng & Units 12/09/2019 12/08/2019 12/06/2019  Glucose 70 - 99 mg/dL 138(H) 116(H) 170(H)  BUN 8 - 23 mg/dL 22 19 19   Creatinine 0.44 - 1.00 mg/dL 1.12(H) 1.19(H) 1.02(H)  Sodium 135 - 145 mmol/L 131(L) 130(L) 135  Potassium 3.5 - 5.1 mmol/L 5.2(H) 5.4(H) 4.5  Chloride 98 - 111 mmol/L 96(L) 97(L) 97(L)  CO2 22 - 32 mmol/L 27 25 28   Calcium 8.9 - 10.3 mg/dL 8.5(L) 8.7(L) 9.1  Total Protein 6.5 - 8.1 g/dL - - -  Total Bilirubin 0.3 - 1.2 mg/dL - - -  Alkaline Phos 38 - 126 U/L - - -  AST 15 - 41 U/L - - -  ALT 0 - 44 U/L - - -   CBC Latest Ref Rng & Units 12/09/2019 12/06/2019 10/17/2019  WBC 4.0 - 10.5 K/uL 7.2 6.1 7.1  Hemoglobin 12.0 - 15.0 g/dL 10.9(L) 10.8(L) 11.5  Hematocrit 36.0 - 46.0 % 35.0(L) 35.6(L) 35.2  Platelets 150 - 400 K/uL PLATELET CLUMPS NOTED ON SMEAR, UNABLE TO ESTIMATE PLATELETS APPEAR ADEQUATE CANCELED   External Labs:  Cholesterol, total 133.000 m 02/07/2020 HDL 37.000 mg 02/07/2020 LDL 76.000 mg 02/07/2020 Triglycerides 109.000 m 02/07/2020  A1c 6.700 % 12/07/2019   Cholesterol, total 142.000 M 05/09/2019 HDL 44.000 M 05/09/2019 LDL-C 88.000 M 05/09/2018 Triglycerides 100.000 08/07/2019  A1C 6.200 % 05/09/2018 TSH 3.040 05/09/2019  Hemoglobin 10.200 02/28/2019   Creatinine, Serum 1.170 MG/ 05/09/2019 Potassium 4.700 02/28/2019 ALT (SGPT) 11.000 IU/ 05/09/2019  Medications   Current Outpatient Medications on File Prior to Visit  Medication Sig Dispense Refill  . acetaminophen (TYLENOL) 650 MG CR tablet Take 650 mg by mouth every 8 (eight) hours as needed for pain.    Marland Kitchen aspirin EC 81 MG tablet Take 81 mg by mouth daily.    . Brinzolamide-Brimonidine (SIMBRINZA) 1-0.2 % SUSP Apply 1 drop to eye in the morning and at bedtime.     Marland Kitchen guaiFENesin-dextromethorphan (ROBITUSSIN DM) 100-10 MG/5ML  syrup Take 5 mLs by mouth every 4 (four) hours as needed for cough. 118 mL 0  . ipratropium-albuterol (DUONEB) 0.5-2.5 (3) MG/3ML SOLN Take 3 mLs by nebulization every 4 (four) hours as needed. 360 mL  0  . isosorbide mononitrate (IMDUR) 60 MG 24 hr tablet TAKE 1 TABLET DAILY 90 tablet 3  . liraglutide (VICTOZA) 18 MG/3ML SOPN Inject 0.6 mg into the skin daily.     . metFORMIN (GLUCOPHAGE) 1000 MG tablet Take 0.5 tablets (500 mg total) by mouth daily.    Marland Kitchen ROCKLATAN 0.02-0.005 % SOLN Place 1 drop into both eyes at bedtime.     No current facility-administered medications on file prior to visit.     Cardiac Studies:   Nuclear stress test [05/24/2015]:  1. The resting electrocardiogram demonstrated normal sinus rhythm, normal resting conduction and no resting arrhythmias. Stress EKG is non-diagnostic for ischemia as it a pharmacologic stress using Lexiscan. Stress symptoms included dyspnea. 2. Myocardial perfusion imaging is normal. Overall left ventricular systolic function was normal without regional wall motion abnormalities. The left ventricular ejection fraction was 70%. No significant change from 12/19/12.  Carotid Doppler [12/08/2016]: No hemodynamically significant arterial disease in the internal carotid artery bilaterally. There is mixed plaque bilateral bulb. Antegrade right vertebral artery flow. Antegrade left vertebral artery flow.  Right Heart Cath [12/09/2015]: Mild to moderate pulmonary hypertension. RA 11/10/8, RV 52/5, EDP 11, PA 54/20, mean 34 mmHg. PA saturation 58%. PW 19/25, mean 17 mmHg. Post 600 g nitroglycerin: PA pressure 40/9, mean 23 mmHg. PW 11/11, mean 8 mmHg. CO 5.76, CI 2.77. QP/QS 0.84.  Echocardiogram 10/19/2019:  Normal LV systolic function with visual EF 55-60%. Left ventricle cavity is normal in size. Normal global wall motion. Doppler evidence of grade II diastolic dysfunction, elevated LAP.  Left atrial cavity is mildly dilated.  Mild (Grade I) aortic  regurgitation.  Aortic sclerosis without stenosis.  Mild (Grade I) mitral regurgitation.  Mild tricuspid regurgitation. Mild pulmonary hypertension. RVSP measures 44 mmHg.  Compared to prior study 6/0/1093: diastolic dysfunction is now grade II with increased LAP, MR and TR have improved.   EKG:    EKG 05/22/2020: Sinus rhythm with first-degree block at rate of 82 bpm, left radial enlargement, left axis deviation, left anterior fascicular block.  LVH.  Poor R wave progression, cannot exclude anterolateral infarct old.  No evidence of ischemia, normal QT interval.   No significant change from EKG 10/12/2019.  Assessment     ICD-10-CM   1. Chronic diastolic CHF (congestive heart failure) (HCC)  I50.32 EKG 12-Lead    hydrochlorothiazide (HYDRODIURIL) 25 MG tablet  2. Essential hypertension  I10 amLODipine (NORVASC) 10 MG tablet  3. Moderate mitral regurgitation  I34.0   4. Nocturnal hypoxemia  G47.34     Recommendations:   RAVONDA BRECHEEN  is a 87 y.o. female  aortic stenosis, Moderate mitral regurgitation, hypertension, diabetes mellitus, morbid obesity, and sleep apnea. She is here for follow-up of aortic stenosis, hypertension, and shortness of breath. She reports symptoms of shortness of breath, orthopnea. No chest pain.  She wears supplemental oxygen at night for sleep apnea as she cannot tolerate CPAP.   Her symptoms of recurrent heart failure in the form of orthopnea and PND could be related to underlying coronary artery disease in view of her risk factors but could also be recurrence of diastolic heart failure acute decompensation.  Advised her to watch her diet and to record any change in her diet that occurs during the episodes of PND and orthopnea, she has been having about 2 episodes every month.  In the interim I will increase amlodipine to 10 mg as the blood pressure is still slightly elevated and add hydrochlorothiazide 25 mg  daily as well.  She has an appointment to see Dr. [name] with labs to be performed in a week to 10 days.  This will be the perfect time to see whether she is tolerating amlodipine and also addition of hydrochlorothiazide.  I would like to see her back in 6 weeks for follow-up.  Given her advanced age, negative nuclear stress test in 2017, would prefer to not repeat stress test and or consider cardiac catheterization unless I am unable to control her symptoms with medication changes.  She feels well wearing oxygen at night with increased energy during the daytime.  We will continue the same.  She has lost about 15 pounds in weight by dieting over the past 6 months and I have congratulated her and encouraged her to continue to do so.   Adrian Prows, MD, [facility] 05/22/2020, 4:50 PM Office: [phone]

## 2020-05-24 ENCOUNTER — Telehealth: Payer: Self-pay

## 2020-05-24 NOTE — Telephone Encounter (Signed)
Pt called because you doubled her bp medication. She said she doesn't feel normal, she feels like her head feels funny. She said her bp is 116/61 and her hr is 73. She said she is not sure if she feels funny because she has to adjust to the medication or not. Please advise.

## 2020-05-26 DIAGNOSIS — R55 Syncope and collapse: Secondary | ICD-10-CM | POA: Diagnosis not present

## 2020-05-26 DIAGNOSIS — R4020 Unspecified coma: Secondary | ICD-10-CM | POA: Diagnosis not present

## 2020-05-26 NOTE — Telephone Encounter (Signed)
She can reduce the Amlodipine back to 5 mg (can cut 10 mg in half) and continue Hydrochlorothiazide.

## 2020-05-27 NOTE — Telephone Encounter (Signed)
Also cut HCTZ to 1/2 tablet daily

## 2020-05-27 NOTE — Telephone Encounter (Signed)
Called and spoke with pt regarding her medication. Pt voiced understanding and will be switching her amlodipine back to 5mg .

## 2020-05-28 NOTE — Telephone Encounter (Signed)
Called and informed pt to cut HCTZ in 1/2. Pt voiced understanding.

## 2020-05-30 DIAGNOSIS — E1122 Type 2 diabetes mellitus with diabetic chronic kidney disease: Secondary | ICD-10-CM | POA: Diagnosis not present

## 2020-05-30 DIAGNOSIS — I10 Essential (primary) hypertension: Secondary | ICD-10-CM | POA: Diagnosis not present

## 2020-05-30 DIAGNOSIS — E785 Hyperlipidemia, unspecified: Secondary | ICD-10-CM | POA: Diagnosis not present

## 2020-05-31 DIAGNOSIS — R42 Dizziness and giddiness: Secondary | ICD-10-CM | POA: Diagnosis not present

## 2020-05-31 DIAGNOSIS — R4189 Other symptoms and signs involving cognitive functions and awareness: Secondary | ICD-10-CM | POA: Diagnosis not present

## 2020-06-03 ENCOUNTER — Other Ambulatory Visit: Payer: Self-pay | Admitting: Family Medicine

## 2020-06-03 DIAGNOSIS — R42 Dizziness and giddiness: Secondary | ICD-10-CM

## 2020-06-03 DIAGNOSIS — R4189 Other symptoms and signs involving cognitive functions and awareness: Secondary | ICD-10-CM

## 2020-06-17 ENCOUNTER — Telehealth: Payer: Self-pay

## 2020-06-17 NOTE — Telephone Encounter (Signed)
Patient called and asked if she can go back to Amlodipine 10mg , because her legs and feet are starting to swell again. Please advise.

## 2020-06-17 NOTE — Telephone Encounter (Signed)
It is HCTZ that she cut in half because of dizziness. She should go back to full HCTZ (Hydrochlorthiazide). If low BP, to stop Amlodipine

## 2020-06-19 NOTE — Telephone Encounter (Signed)
Patient is aware 

## 2020-06-23 ENCOUNTER — Other Ambulatory Visit: Payer: Medicare Other

## 2020-07-04 ENCOUNTER — Other Ambulatory Visit: Payer: Self-pay

## 2020-07-04 ENCOUNTER — Ambulatory Visit: Payer: TRICARE For Life (TFL) | Admitting: Cardiology

## 2020-07-04 ENCOUNTER — Encounter: Payer: Self-pay | Admitting: Cardiology

## 2020-07-04 ENCOUNTER — Ambulatory Visit: Payer: Medicare Other | Admitting: Cardiology

## 2020-07-04 VITALS — BP 140/75 | HR 69 | Temp 97.8°F | Resp 16 | Ht 64.0 in | Wt 205.2 lb

## 2020-07-04 DIAGNOSIS — I5032 Chronic diastolic (congestive) heart failure: Secondary | ICD-10-CM | POA: Diagnosis not present

## 2020-07-04 DIAGNOSIS — G4734 Idiopathic sleep related nonobstructive alveolar hypoventilation: Secondary | ICD-10-CM

## 2020-07-04 DIAGNOSIS — R0602 Shortness of breath: Secondary | ICD-10-CM | POA: Diagnosis not present

## 2020-07-04 DIAGNOSIS — I10 Essential (primary) hypertension: Secondary | ICD-10-CM | POA: Diagnosis not present

## 2020-07-04 NOTE — Progress Notes (Signed)
Primary Physician/Referring:  Teresa Morning, DO  Patient ID: [name], female    DOB: 1930-07-28, 87 y.o.   MRN: [id number]  Chief Complaint  Patient presents with  . Chronic diastolic CHF   . Follow-up  . Hypertension   HPI:    HPI: [name]  is a 87 y.o. female  aortic stenosis, hypertension, diabetes mellitus, morbid obesity, and sleep apnea. She wears supplemental oxygen at night for sleep apnea as she cannot tolerate CPAP with marked improvement in daytime somnolence and also fatigue.   This is a 14-month office visit for hypertension and chronic diastolic heart failure, states that since making blood pressure medication change and adding diuretic, her blood pressure has been very well controlled, no leg edema, dyspnea has improved and no PND or orthopnea.  Accompanied by her daughter today.  She is trying to lose weight and has lost about 10 pounds in weight since last office visit 3 months ago.  Past Medical History:  Diagnosis Date  . Cellulitis and abscess of left leg   . CHF (congestive heart failure) ([place])   . COPD (chronic obstructive pulmonary disease) ([place])   . Diabetes mellitus   . Glaucoma   . Hyperlipidemia   . Hypertension    Past Surgical History:  Procedure Laterality Date  . BACK SURGERY    . CARDIAC CATHETERIZATION N/A 12/09/2015   Procedure: Right Heart Cath;  Surgeon: Adrian Prows, MD;  Location: [place];  Service: Cardiovascular;  Laterality: N/A;  . CHOLECYSTECTOMY  2000  . HERNIA REPAIR  2007  . rupture disk  1970"s  . SHOULDER ARTHROSCOPY WITH ROTATOR CUFF REPAIR  1999   rt shoulder   Social History   Tobacco Use  . Smoking status: Never Smoker  . Smokeless tobacco: Never Used  Substance Use Topics  . Alcohol use: No   Marital Status: Married   ROS  Review of Systems  Constitutional: Positive for malaise/fatigue.  Cardiovascular: Positive for dyspnea on exertion. Negative for chest pain, leg swelling and orthopnea.   Gastrointestinal: Negative for melena.   Objective  Blood pressure 140/75, pulse 69, temperature 97.8 F (36.6 C), temperature source Temporal, resp. rate 16, height 5\' 4"  (1.626 m), weight 205 lb 3.2 oz (93.1 kg), SpO2 95 %. Body mass index is 35.22 kg/m.   Vitals with BMI 07/04/2020 05/22/2020 12/09/2019  Height 5\' 4"  5\' 4"  -  Weight 205 lbs 3 oz 206 lbs -  BMI 83.15 17.61 -  Systolic 607 371 062  Diastolic 75 74 73  Pulse 69 96 102   Physical Exam Constitutional:      Comments: Morbidly obese  Neck:     Thyroid: No thyromegaly.     Comments: Short neck and difficult to evaluate JVP Cardiovascular:     Rate and Rhythm: Normal rate and regular rhythm.     Pulses: Normal pulses and intact distal pulses.          Carotid pulses are 2+ on the right side with bruit and 2+ on the left side with bruit.      Dorsalis pedis pulses are 2+ on the right side and 2+ on the left side.       Posterior tibial pulses are 2+ on the right side and 2+ on the left side.     Heart sounds: Murmur heard.  High-pitched harsh midsystolic murmur is present with a grade of 2/6 at the upper right sternal border.  Blowing holosystolic murmur  of grade 3/6 is also present at the apex. No gallop.      Comments: Femoral and popliteal pulse difficult to feel due to patient's body habitus.  No pedal edema. No JVD.  Pulmonary:     Breath sounds: Normal breath sounds.  Abdominal:     General: Bowel sounds are normal.     Palpations: Abdomen is soft.     Comments: Obese. Pannus present    Radiology: No results found.  Laboratory examination:   CMP Latest Ref Rng & Units 12/09/2019 12/08/2019 12/06/2019  Glucose 70 - 99 mg/dL 138(H) 116(H) 170(H)  BUN 8 - 23 mg/dL 22 19 19   Creatinine 0.44 - 1.00 mg/dL 1.12(H) 1.19(H) 1.02(H)  Sodium 135 - 145 mmol/L 131(L) 130(L) 135  Potassium 3.5 - 5.1 mmol/L 5.2(H) 5.4(H) 4.5  Chloride 98 - 111 mmol/L 96(L) 97(L) 97(L)  CO2 22 - 32 mmol/L 27 25 28   Calcium 8.9 - 10.3  mg/dL 8.5(L) 8.7(L) 9.1  Total Protein 6.5 - 8.1 g/dL - - -  Total Bilirubin 0.3 - 1.2 mg/dL - - -  Alkaline Phos 38 - 126 U/L - - -  AST 15 - 41 U/L - - -  ALT 0 - 44 U/L - - -   CBC Latest Ref Rng & Units 12/09/2019 12/06/2019 10/17/2019  WBC 4.0 - 10.5 K/uL 7.2 6.1 7.1  Hemoglobin 12.0 - 15.0 g/dL 10.9(L) 10.8(L) 11.5  Hematocrit 36.0 - 46.0 % 35.0(L) 35.6(L) 35.2  Platelets 150 - 400 K/uL PLATELET CLUMPS NOTED ON SMEAR, UNABLE TO ESTIMATE PLATELETS APPEAR ADEQUATE CANCELED   External Labs:  Cholesterol, total 127.000 m 05/30/2020 HDL 36.000 mg 05/30/2020 LDL 69.000 mg 05/30/2020 Triglycerides 119.000 m 05/30/2020  Cholesterol, total 133.000 m 02/07/2020 HDL 37.000 mg 02/07/2020 LDL 76.000 mg 02/07/2020 Triglycerides 109.000 m 02/07/2020  A1c 6.700 % 12/07/2019   Cholesterol, total 142.000 M 05/09/2019 HDL 44.000 M 05/09/2019 LDL-C 88.000 M 05/09/2018 Triglycerides 100.000 08/07/2019  A1C 6.200 % 05/09/2018 TSH 3.040 05/09/2019  Hemoglobin 10.200 02/28/2019   Creatinine, Serum 1.170 MG/ 05/09/2019 Potassium 4.700 02/28/2019 ALT (SGPT) 11.000 IU/ 05/09/2019  Medications and allergies   Allergies  Allergen Reactions  . Cephalexin Anaphylaxis and Swelling  . Micardis [Telmisartan] Other (See Comments)    Hyperkalemia during hospitalization with pulmonary edema  . Invokana [Canagliflozin]     Pt does not recall  . Lipitor [Atorvastatin Calcium] Other (See Comments)    myalgia  . Tramadol Nausea Only     Current Outpatient Medications on File Prior to Visit  Medication Sig Dispense Refill  . acetaminophen (TYLENOL) 650 MG CR tablet Take 650 mg by mouth every 8 (eight) hours as needed for pain.    Marland Kitchen amLODipine (NORVASC) 10 MG tablet Take 1 tablet (10 mg total) by mouth daily. 90 tablet 3  . aspirin EC 81 MG tablet Take 81 mg by mouth daily.    . Brinzolamide-Brimonidine (SIMBRINZA) 1-0.2 % SUSP Apply 1 drop to eye in the Small and at bedtime.     Marland Kitchen guaiFENesin-dextromethorphan  (ROBITUSSIN DM) 100-10 MG/5ML syrup Take 5 mLs by mouth every 4 (four) hours as needed for cough. 118 mL 0  . hydrochlorothiazide (HYDRODIURIL) 25 MG tablet Take 1 tablet (25 mg total) by mouth daily. 30 tablet 2  . ipratropium-albuterol (DUONEB) 0.5-2.5 (3) MG/3ML SOLN Take 3 mLs by nebulization every 4 (four) hours as needed. 360 mL 0  . isosorbide mononitrate (IMDUR) 60 MG 24 hr tablet TAKE 1 TABLET DAILY 90 tablet 3  .  liraglutide (VICTOZA) 18 MG/3ML SOPN Inject 0.6 mg into the skin daily.     . metFORMIN (GLUCOPHAGE) 1000 MG tablet Take 0.5 tablets (500 mg total) by mouth daily.    Marland Kitchen ROCKLATAN 0.02-0.005 % SOLN Place 1 drop into both eyes at bedtime.     No current facility-administered medications on file prior to visit.    Cardiac Studies:   Nuclear stress test [05/24/2015]:  1. The resting electrocardiogram demonstrated normal sinus rhythm, normal resting conduction and no resting arrhythmias. Stress EKG is non-diagnostic for ischemia as it a pharmacologic stress using Lexiscan. Stress symptoms included dyspnea. 2. Myocardial perfusion imaging is normal. Overall left ventricular systolic function was normal without regional wall motion abnormalities. The left ventricular ejection fraction was 70%. No significant change from 12/19/12.  Carotid Doppler [12/08/2016]: No hemodynamically significant arterial disease in the internal carotid artery bilaterally. There is mixed plaque bilateral bulb. Antegrade right vertebral artery flow. Antegrade left vertebral artery flow.  Right Heart Cath [12/09/2015]: Mild to moderate pulmonary hypertension. RA 11/10/8, RV 52/5, EDP 11, PA 54/20, mean 34 mmHg. PA saturation 58%. PW 19/25, mean 17 mmHg. Post 600 g nitroglycerin: PA pressure 40/9, mean 23 mmHg. PW 11/11, mean 8 mmHg. CO 5.76, CI 2.77. QP/QS 0.84.  Echocardiogram 10/19/2019:  Normal LV systolic function with visual EF 55-60%. Left ventricle cavity is normal in size. Normal global wall  motion. Doppler evidence of grade II diastolic dysfunction, elevated LAP.  Left atrial cavity is mildly dilated.  Mild (Grade I) aortic regurgitation.  Aortic sclerosis without stenosis.  Mild (Grade I) mitral regurgitation.  Mild tricuspid regurgitation. Mild pulmonary hypertension. RVSP measures 44 mmHg.  Compared to prior study 10/04/6787: diastolic dysfunction is now grade II with increased LAP, MR and TR have improved.   EKG:    EKG 05/22/2020: Sinus rhythm with first-degree block at rate of 82 bpm, left radial enlargement, left axis deviation, left anterior fascicular block.  LVH.  Poor R wave progression, cannot exclude anterolateral infarct old.  No evidence of ischemia, normal QT interval.   No significant change from EKG 10/12/2019.  Assessment     ICD-10-CM   1. Chronic diastolic CHF (congestive heart failure) (HCC)  I50.32   2. Shortness of breath  R06.02   3. Essential hypertension  I10   4. Nocturnal hypoxemia  G47.34   No orders of the defined types were placed in this encounter. There are no discontinued medications. No orders of the defined types were placed in this encounter.     Recommendations:   TOYA PALACIOS  is a 87 y.o. aortic stenosis, hypertension, diabetes mellitus, morbid obesity, and sleep apnea. She wears supplemental oxygen at night for sleep apnea as she cannot tolerate CPAP with marked improvement in daytime somnolence and also fatigue.   This is a 69-month office visit for hypertension and chronic diastolic heart failure, states that since making blood pressure medication change and adding diuretic, her blood pressure has been very well controlled.  Has been complete resolution of leg edema, and overall patient is presently feeling well.  After insistence, she has now lost weight and plans to continue to lose more weight.  Today she has lost about 10 pounds since last office visit 3 months ago.  Overall she has lost about 25 pounds over the past 6 months.   I have congratulated her.  Her daughter is present at the bedside and all questions answered.  I will see her back in 6 months.   With  regard to ARB, patient previously was on telmisartan 80 mg, was discontinued in August 2021 hospitalization with acute pulmonary edema due to hyperkalemia and hyponatremia.  We could consider starting her on a small dose of telmisartan at 20 mg daily in view of diabetic state, will leave this to her PCP.   Adrian Prows, MD, [facility] 07/06/2020, 3:40 PM Office: [phone]

## 2020-07-15 DIAGNOSIS — L570 Actinic keratosis: Secondary | ICD-10-CM | POA: Diagnosis not present

## 2020-07-15 DIAGNOSIS — Z85828 Personal history of other malignant neoplasm of skin: Secondary | ICD-10-CM | POA: Diagnosis not present

## 2020-07-15 DIAGNOSIS — L820 Inflamed seborrheic keratosis: Secondary | ICD-10-CM | POA: Diagnosis not present

## 2020-07-15 DIAGNOSIS — Z08 Encounter for follow-up examination after completed treatment for malignant neoplasm: Secondary | ICD-10-CM | POA: Diagnosis not present

## 2020-07-15 DIAGNOSIS — X32XXXD Exposure to sunlight, subsequent encounter: Secondary | ICD-10-CM | POA: Diagnosis not present

## 2020-08-06 ENCOUNTER — Other Ambulatory Visit: Payer: Self-pay | Admitting: Cardiology

## 2020-08-06 DIAGNOSIS — I5032 Chronic diastolic (congestive) heart failure: Secondary | ICD-10-CM

## 2020-08-28 DIAGNOSIS — H401131 Primary open-angle glaucoma, bilateral, mild stage: Secondary | ICD-10-CM | POA: Diagnosis not present

## 2020-09-19 ENCOUNTER — Other Ambulatory Visit: Payer: Self-pay | Admitting: Cardiology

## 2020-10-17 DIAGNOSIS — C44311 Basal cell carcinoma of skin of nose: Secondary | ICD-10-CM | POA: Diagnosis not present

## 2020-10-17 DIAGNOSIS — L820 Inflamed seborrheic keratosis: Secondary | ICD-10-CM | POA: Diagnosis not present

## 2020-10-17 DIAGNOSIS — C44329 Squamous cell carcinoma of skin of other parts of face: Secondary | ICD-10-CM | POA: Diagnosis not present

## 2020-10-17 DIAGNOSIS — L570 Actinic keratosis: Secondary | ICD-10-CM | POA: Diagnosis not present

## 2020-10-17 DIAGNOSIS — X32XXXD Exposure to sunlight, subsequent encounter: Secondary | ICD-10-CM | POA: Diagnosis not present

## 2020-10-30 ENCOUNTER — Other Ambulatory Visit: Payer: Self-pay | Admitting: Cardiology

## 2020-10-30 DIAGNOSIS — I5032 Chronic diastolic (congestive) heart failure: Secondary | ICD-10-CM

## 2020-11-28 ENCOUNTER — Ambulatory Visit (HOSPITAL_COMMUNITY)
Admission: EM | Admit: 2020-11-28 | Discharge: 2020-11-28 | Disposition: A | Discharge status: Home / Self Care | Payer: Medicare Other | Attending: Emergency Medicine | Admitting: Emergency Medicine

## 2020-11-28 ENCOUNTER — Other Ambulatory Visit: Payer: Self-pay

## 2020-11-28 ENCOUNTER — Ambulatory Visit (INDEPENDENT_AMBULATORY_CARE_PROVIDER_SITE_OTHER): Payer: Medicare Other

## 2020-11-28 ENCOUNTER — Encounter (HOSPITAL_COMMUNITY): Payer: Self-pay

## 2020-11-28 DIAGNOSIS — J441 Chronic obstructive pulmonary disease with (acute) exacerbation: Secondary | ICD-10-CM | POA: Diagnosis not present

## 2020-11-28 DIAGNOSIS — R0602 Shortness of breath: Secondary | ICD-10-CM

## 2020-11-28 DIAGNOSIS — J449 Chronic obstructive pulmonary disease, unspecified: Secondary | ICD-10-CM | POA: Diagnosis not present

## 2020-11-28 MED ORDER — PREDNISONE 20 MG PO TABS
40.0000 mg | ORAL_TABLET | Freq: Every day | ORAL | 0 refills | Status: DC
Start: 2020-11-28 — End: 2022-05-07

## 2020-11-28 NOTE — Discharge Instructions (Addendum)
Your chest x-ray today did not show signs of infection or fluid buildup however it did show some streaking that could be consistent with COPD, therefore we will move forward treating the as a COPD flare up  Take 2 pills every morning with food for the next 5 days, monitor blood sugars closely while taking medication, blood sugars are returned to normal once medication course is complete  Continue to use DuoNeb treatments as needed every 4-6 hours for shortness of breath, continue to use oxygen as prescribed, may place oxygen on during periods of shortness of breath as well  Shortness of breath persist or worsen, you begin to cough up sputum your legs began to swell, you begin to have fevers or chills or other signs of infection please follow-up with your primary care doctor soon as possible, if unable to get appointment with primary care doctor you may return to urgent care for evaluation

## 2020-11-28 NOTE — ED Triage Notes (Signed)
Pt presents with c/o SOB.   States she has had her breathing treatment and has not felt better.   Pt daughter states she has COPD and congestive heart failure.

## 2020-11-28 NOTE — ED Provider Notes (Signed)
[facility]    CSN: [id number] Arrival date & time: 11/28/20  0801      History   Chief Complaint Chief Complaint  Patient presents with   Shortness of Breath    HPI [name] is a 87 y.o. female.   Patient presents with shortness of breath at rest beginning last night and worsening throughout the night.  used two DuoNeb treatments this morning approximately around 530 with no improvement seen.  Patient was in the garden yesterday afternoon pulling up corn, actually had no symptoms afterwards.  Denies chest pain, chest tightness, wheezing, fevers, chills, cough, tachycardia, URI symptoms, heartburn or indigestion, abdominal pain, nausea, vomiting, diarrhea.  History of diabetes type 2, controlled, hypertension, hyperlipidemia, heart failure, COPD.  Taking all medications as prescribed, has taken medications for this morning.  Chronically on 2 L of O2 at night.  Past Medical History:  Diagnosis Date   Cellulitis and abscess of left leg    CHF (congestive heart failure) (HCC)    COPD (chronic obstructive pulmonary disease) ([place])    Diabetes mellitus    Glaucoma    Hyperlipidemia    Hypertension     Patient Active Problem List   Diagnosis Date Noted   CAP (community acquired pneumonia) 12/07/2019   Cellulitis 02/26/2019   Essential hypertension 11/03/2017   Sepsis ([place]) 11/03/2017   Type II diabetes mellitus with renal manifestations ([place]) 11/03/2017   Cellulitis of right leg 11/03/2017   Flank pain 11/03/2017   Chronic diastolic CHF (congestive heart failure) ([place]) 03/19/2017   Diabetes mellitus ([place]) 03/19/2017   Obesity, diabetes, and hypertension syndrome ([place]) 03/19/2017   CKD (chronic kidney disease) stage 3, GFR 30-59 ml/min (HCC) 03/19/2017   Syncope 10/24/2016   Pulmonary hypertension ([place]) 12/07/2015   Varicose veins of leg with complications [id number]   Varicose veins of left lower extremity with complications [id number]   Varicose veins of  lower extremities with other complications [id number]   Lipoma of back 07/19/2012    Past Surgical History:  Procedure Laterality Date   BACK SURGERY     CARDIAC CATHETERIZATION N/A 12/09/2015   Procedure: Right Heart Cath;  Surgeon: Adrian Prows, MD;  Location: [place] [facility];  Service: Cardiovascular;  Laterality: N/A;   CHOLECYSTECTOMY  2000   HERNIA REPAIR  2007   rupture disk  1970"s   SHOULDER ARTHROSCOPY WITH ROTATOR CUFF REPAIR  1999   rt shoulder    OB History   No obstetric history on file.      Home Medications    Prior to Admission medications   Medication Sig Start Date End Date Taking? Authorizing Provider  acetaminophen (TYLENOL) 650 MG CR tablet Take 650 mg by mouth every 8 (eight) hours as needed for pain.    [provider]  amLODipine (NORVASC) 10 MG tablet Take 1 tablet (10 mg total) by mouth daily. 05/22/20   Adrian Prows, MD  aspirin EC 81 MG tablet Take 81 mg by mouth daily.    [provider]  Brinzolamide-Brimonidine (SIMBRINZA) 1-0.2 % SUSP Apply 1 drop to eye in the morning and at bedtime.     [provider]  guaiFENesin-dextromethorphan (ROBITUSSIN DM) 100-10 MG/5ML syrup Take 5 mLs by mouth every 4 (four) hours as needed for cough. 12/09/19   Dahal, Marlowe Aschoff, MD  hydrochlorothiazide (HYDRODIURIL) 25 MG tablet TAKE 1 TABLET(25 MG) BY MOUTH DAILY 10/30/20   Adrian Prows, MD  ipratropium-albuterol (DUONEB) 0.5-2.5 (3) MG/3ML SOLN Take 3 mLs by  nebulization every 4 (four) hours as needed. 12/09/19 01/08/20  Terrilee Croak, MD  isosorbide mononitrate (IMDUR) 60 MG 24 hr tablet TAKE 1 TABLET DAILY 09/19/20   Adrian Prows, MD  liraglutide (VICTOZA) 18 MG/3ML SOPN Inject 0.6 mg into the skin daily.     [provider]  metFORMIN (GLUCOPHAGE) 1000 MG tablet Take 0.5 tablets (500 mg total) by mouth daily. 10/27/16   [name], MD  ROCKLATAN 0.02-0.005 % SOLN Place 1 drop into both eyes at bedtime. 11/24/19   [provider]     Family History Family History  Problem Relation Age of Onset   Stroke Mother     Social History Social History   Tobacco Use   Smoking status: Never   Smokeless tobacco: Never  Vaping Use   Vaping Use: Never used  Substance Use Topics   Alcohol use: No   Drug use: No     Allergies   Cephalexin, Micardis [telmisartan], Invokana [canagliflozin], Lipitor [atorvastatin calcium], and Tramadol   Review of Systems Review of Systems  Constitutional: Negative.   HENT: Negative.    Respiratory:  Positive for shortness of breath. Negative for apnea, cough, choking, chest tightness, wheezing and stridor.   Cardiovascular: Negative.   Gastrointestinal: Negative.   Musculoskeletal: Negative.   Skin: Negative.   Neurological: Negative.     Physical Exam Triage Vital Signs ED Triage Vitals  Enc Vitals Group     BP 11/28/20 0823 (!) 116/54     Pulse Rate 11/28/20 0820 81     Resp 11/28/20 0820 16     Temp 11/28/20 0820 97.8 F (36.6 C)     Temp Source 11/28/20 0820 Oral     SpO2 11/28/20 0820 95 %     Weight --      Height --      Head Circumference --      Peak Flow --      Pain Score 11/28/20 0819 0     Pain Loc --      Pain Edu? --      Excl. in [place]? --    No data found.  Updated Vital Signs BP (!) 116/54   Pulse 81   Temp 97.8 F (36.6 C) (Oral)   Resp 16   SpO2 95%   Visual Acuity Right Eye Distance:   Left Eye Distance:   Bilateral Distance:    Right Eye Near:   Left Eye Near:    Bilateral Near:     Physical Exam Constitutional:      Appearance: She is well-developed. She is obese.  HENT:     Head: Normocephalic.  Eyes:     Pupils: Pupils are equal, round, and reactive to light.  Cardiovascular:     Rate and Rhythm: Normal rate and regular rhythm.     Pulses: Normal pulses.  Pulmonary:     Effort: Pulmonary effort is normal.     Breath sounds: Examination of the right-upper field reveals wheezing. Examination of the left-upper field  reveals wheezing. Examination of the left-middle field reveals wheezing. Wheezing present.  Abdominal:     General: Bowel sounds are normal.     Palpations: Abdomen is soft.  Musculoskeletal:     Cervical back: Normal range of motion.  Skin:    General: Skin is warm and dry.  Neurological:     General: No focal deficit present.     Mental Status: She is alert and oriented to person, place, and time.  Psychiatric:        Mood and Affect: Mood normal.        Behavior: Behavior normal.     UC Treatments / Results  Labs (all labs ordered are listed, but only abnormal results are displayed) Labs Reviewed - No data to display  EKG   Radiology No results found.  Procedures Procedures (including critical care time)  Medications Ordered in UC Medications - No data to display  Initial Impression / Assessment and Plan / UC Course  I have reviewed the triage vital signs and the nursing notes.  Pertinent labs & imaging results that were available during my care of the patient were reviewed by me and considered in my medical decision making (see chart for details).  COPD exacerbation 1.  EKG-normal sinus 2.  Chest x-ray showing atelectasis, change from 2019, no fluid or pneumonia noted 3.  Prednisone 40 mg daily for 5 days discussed effects on diabetes, patient checks blood sugars at home daily 4.  Advised to continue use of DuoNeb treatments as needed advised use of supplemental oxygen as needed during periods of shortness of breath 5.  Has PCP appointment later this month, will follow-up at that time, advised to attempt to get sooner appointment if his symptoms are persistent or worsening, verbalized understanding  Final Clinical Impressions(s) / UC Diagnoses   Final diagnoses:  None   Discharge Instructions   None    ED Prescriptions   None    PDMP not reviewed this encounter.   Hans Eden, [place] 11/28/20 [phone]

## 2020-12-12 DIAGNOSIS — Z08 Encounter for follow-up examination after completed treatment for malignant neoplasm: Secondary | ICD-10-CM | POA: Diagnosis not present

## 2020-12-12 DIAGNOSIS — L570 Actinic keratosis: Secondary | ICD-10-CM | POA: Diagnosis not present

## 2020-12-12 DIAGNOSIS — X32XXXD Exposure to sunlight, subsequent encounter: Secondary | ICD-10-CM | POA: Diagnosis not present

## 2020-12-12 DIAGNOSIS — Z85828 Personal history of other malignant neoplasm of skin: Secondary | ICD-10-CM | POA: Diagnosis not present

## 2020-12-18 ENCOUNTER — Ambulatory Visit (INDEPENDENT_AMBULATORY_CARE_PROVIDER_SITE_OTHER): Payer: Medicare Other | Admitting: Podiatry

## 2020-12-18 ENCOUNTER — Other Ambulatory Visit: Payer: Self-pay

## 2020-12-18 DIAGNOSIS — Z79899 Other long term (current) drug therapy: Secondary | ICD-10-CM | POA: Insufficient documentation

## 2020-12-18 DIAGNOSIS — I340 Nonrheumatic mitral (valve) insufficiency: Secondary | ICD-10-CM | POA: Insufficient documentation

## 2020-12-18 DIAGNOSIS — G4733 Obstructive sleep apnea (adult) (pediatric): Secondary | ICD-10-CM | POA: Insufficient documentation

## 2020-12-18 DIAGNOSIS — M79675 Pain in left toe(s): Secondary | ICD-10-CM | POA: Diagnosis not present

## 2020-12-18 DIAGNOSIS — J449 Chronic obstructive pulmonary disease, unspecified: Secondary | ICD-10-CM | POA: Insufficient documentation

## 2020-12-18 DIAGNOSIS — E1140 Type 2 diabetes mellitus with diabetic neuropathy, unspecified: Secondary | ICD-10-CM | POA: Insufficient documentation

## 2020-12-18 DIAGNOSIS — E1121 Type 2 diabetes mellitus with diabetic nephropathy: Secondary | ICD-10-CM | POA: Insufficient documentation

## 2020-12-18 DIAGNOSIS — M79674 Pain in right toe(s): Secondary | ICD-10-CM

## 2020-12-18 DIAGNOSIS — M1990 Unspecified osteoarthritis, unspecified site: Secondary | ICD-10-CM | POA: Insufficient documentation

## 2020-12-18 DIAGNOSIS — B351 Tinea unguium: Secondary | ICD-10-CM | POA: Diagnosis not present

## 2020-12-18 DIAGNOSIS — L600 Ingrowing nail: Secondary | ICD-10-CM | POA: Diagnosis not present

## 2020-12-18 DIAGNOSIS — F419 Anxiety disorder, unspecified: Secondary | ICD-10-CM | POA: Insufficient documentation

## 2020-12-18 DIAGNOSIS — E785 Hyperlipidemia, unspecified: Secondary | ICD-10-CM | POA: Insufficient documentation

## 2020-12-18 DIAGNOSIS — I359 Nonrheumatic aortic valve disorder, unspecified: Secondary | ICD-10-CM | POA: Insufficient documentation

## 2020-12-18 DIAGNOSIS — E538 Deficiency of other specified B group vitamins: Secondary | ICD-10-CM | POA: Insufficient documentation

## 2020-12-18 DIAGNOSIS — I509 Heart failure, unspecified: Secondary | ICD-10-CM | POA: Insufficient documentation

## 2020-12-18 NOTE — Progress Notes (Signed)
   Subjective: 87 y.o. female presenting today as a new patient with her husband for evaluation of painful dystrophic nails to the bilateral great toes.  Patient states that they are very painful and sensitive.  She states that her left great toenail is sticking straight up.  She cannot wear shoes.  She actually cuts out the toe area of the shoes so they do not rub against her toenails they are that sensitive.  Past Medical History:  Diagnosis Date   Cellulitis and abscess of left leg    CHF (congestive heart failure) (HCC)    COPD (chronic obstructive pulmonary disease) (HCC)    Diabetes mellitus    Glaucoma    Hyperlipidemia    Hypertension     Objective:  General: Well developed, nourished, in no acute distress, alert and oriented x3   Dermatology:  Hyperkeratotic, discolored, thickened, onychodystrophy of the bilateral great toenails.  Associated tenderness to palpation.  The toenails are loosely adhered and very sensitive.  Skin is warm, dry and supple bilateral lower extremities. Negative for open lesions or macerations.  Vascular: Dorsalis Pedis artery and Posterior Tibial artery pedal pulses palpable. No lower extremity edema noted.   Neruologic: Grossly intact via light touch bilateral.  Musculoskeletal: Muscular strength within normal limits in all groups bilateral. Normal range of motion noted to all pedal and ankle joints.   Assessment:  #1 Dystrophic nail of the bilateral great toes #2 Pain in toe    Plan of Care:  1. Patient evaluated.  2. Discussed treatment alternatives and plan of care. Explained nail avulsion procedure and post procedure course to patient. 3. Patient opted for total temporary nail avulsion of the bilateral great toenails.  4. Prior to procedure, local anesthesia infiltration utilized using 3 ml of a 50:50 mixture of 2% plain lidocaine and 0.5% plain marcaine in a normal hallux block fashion and a betadine prep performed.  5. Light dressing  applied. 6.  Silvadene cream provided for the patient to apply daily.  Post care instructions provided.   7.  Return to clinic 2 weeks.   Edrick Kins, DPM [facility]  Dr. Edrick Kins, DPM    [address], [place] [address]                Office [phone]  Fax [phone]

## 2020-12-23 DIAGNOSIS — I10 Essential (primary) hypertension: Secondary | ICD-10-CM | POA: Diagnosis not present

## 2020-12-23 DIAGNOSIS — E119 Type 2 diabetes mellitus without complications: Secondary | ICD-10-CM | POA: Diagnosis not present

## 2020-12-23 DIAGNOSIS — E785 Hyperlipidemia, unspecified: Secondary | ICD-10-CM | POA: Diagnosis not present

## 2020-12-31 ENCOUNTER — Telehealth: Payer: Self-pay | Admitting: Podiatry

## 2020-12-31 NOTE — Telephone Encounter (Signed)
Patient called and stated her toenails are doing great on her big toes and she wanted to know if she should follow up.

## 2020-12-31 NOTE — Telephone Encounter (Signed)
No she can cancel her appt if she is doing well. - Dr. Amalia Hailey

## 2021-01-06 ENCOUNTER — Encounter: Payer: Self-pay | Admitting: Cardiology

## 2021-01-06 ENCOUNTER — Other Ambulatory Visit: Payer: Self-pay

## 2021-01-06 ENCOUNTER — Ambulatory Visit: Payer: Medicare Other | Admitting: Cardiology

## 2021-01-06 VITALS — BP 134/69 | HR 99 | Temp 98.4°F | Resp 17 | Ht 64.0 in | Wt 200.6 lb

## 2021-01-06 DIAGNOSIS — I10 Essential (primary) hypertension: Secondary | ICD-10-CM | POA: Diagnosis not present

## 2021-01-06 DIAGNOSIS — I5032 Chronic diastolic (congestive) heart failure: Secondary | ICD-10-CM | POA: Diagnosis not present

## 2021-01-06 DIAGNOSIS — D649 Anemia, unspecified: Secondary | ICD-10-CM

## 2021-01-06 DIAGNOSIS — G4734 Idiopathic sleep related nonobstructive alveolar hypoventilation: Secondary | ICD-10-CM | POA: Diagnosis not present

## 2021-01-06 NOTE — Progress Notes (Signed)
Primary Physician/Referring:  [name], DO  Patient ID: [name], female    DOB: 1930/11/04, 88 y.o.   MRN: [id number]  Chief Complaint  Patient presents with   Follow-up    6 months   Hypertension   Shortness of Breath   chronic diastolic heart failure   HPI:    HPI: [name]  is a 87 y.o. female  aortic stenosis, hypertension, diabetes mellitus, morbid obesity, and sleep apnea. She wears supplemental oxygen at night for sleep apnea as she cannot tolerate CPAP with marked improvement in daytime somnolence and also fatigue.   This is a 23-monthoffice visit for hypertension and chronic diastolic heart failure, states that since making blood pressure medication change and adding diuretic, her blood pressure has been very well controlled, no leg edema, dyspnea has improved and no PND or orthopnea.  Accompanied by her daughter today.  She is trying to lose weight and has lost about 10 pounds in weight since last office visit 3 months ago.  Past Medical History:  Diagnosis Date   Cellulitis and abscess of left leg    CHF (congestive heart failure) (HCC)    COPD (chronic obstructive pulmonary disease) ([place]    Diabetes mellitus    Glaucoma    Hyperlipidemia    Hypertension    Past Surgical History:  Procedure Laterality Date   BACK SURGERY     CARDIAC CATHETERIZATION N/A 12/09/2015   Procedure: Right Heart Cath;  Surgeon: JAdrian Prows MD;  [facility];  Service: Cardiovascular;  Laterality: N/A;   CHOLECYSTECTOMY  2000   HERNIA REPAIR  2007   rupture disk  1970"s   SHOULDER ARTHROSCOPY WITH ROTATOR CUFF REPAIR  1999   rt shoulder   Social History   Tobacco Use   Smoking status: Never   Smokeless tobacco: Never  Substance Use Topics   Alcohol use: No   Marital Status: Married   ROS  Review of Systems  Constitutional: Positive for malaise/fatigue.  Cardiovascular:  Positive for dyspnea on exertion. Negative for chest pain, leg swelling and  orthopnea.  Gastrointestinal:  Negative for melena.  Objective  Blood pressure 134/69, pulse 99, temperature 98.4 F (36.9 C), temperature source Temporal, resp. rate 17, height '5\' 4"'$  (1.626 m), weight 200 lb 9.6 oz (91 kg), SpO2 96 %. Body mass index is 34.43 kg/m.   Vitals with BMI 01/06/2021 01/06/2021 11/28/2020  Height - '5\' 4"'$  -  Weight - 200 lbs 10 oz -  BMI - 399991111-  Systolic [id number] Diastolic 69 66 54  Pulse 99 91 81   Physical Exam Constitutional:      Comments: Morbidly obese  Neck:     Thyroid: No thyromegaly.     Comments: Short neck and difficult to evaluate JVP Cardiovascular:     Rate and Rhythm: Normal rate and regular rhythm.     Pulses: Normal pulses and intact distal pulses.          Carotid pulses are 2+ on the right side with bruit and 2+ on the left side with bruit.      Dorsalis pedis pulses are 2+ on the right side and 2+ on the left side.       Posterior tibial pulses are 2+ on the right side and 2+ on the left side.     Heart sounds: Murmur heard.  High-pitched harsh midsystolic murmur is present with a grade of 2/6 at the upper  right sternal border.  Blowing holosystolic murmur of grade 3/6 is also present at the apex.    No gallop.     Comments: Femoral and popliteal pulse difficult to feel due to patient's body habitus.  No pedal edema. No JVD.  Pulmonary:     Breath sounds: Normal breath sounds.  Abdominal:     General: Bowel sounds are normal.     Palpations: Abdomen is soft.     Comments: Obese. Pannus present   Radiology: No results found.  Laboratory examination:   CMP Latest Ref Rng & Units 12/09/2019 12/08/2019 12/06/2019  Glucose 70 - 99 mg/dL 138(H) 116(H) 170(H)  BUN 8 - 23 mg/dL '22 19 19  '$ Creatinine 0.44 - 1.00 mg/dL 1.12(H) 1.19(H) 1.02(H)  Sodium 135 - 145 mmol/L 131(L) 130(L) 135  Potassium 3.5 - 5.1 mmol/L 5.2(H) 5.4(H) 4.5  Chloride 98 - 111 mmol/L 96(L) 97(L) 97(L)  CO2 22 - 32 mmol/L '27 25 28  '$ Calcium 8.9 - 10.3 mg/dL  8.5(L) 8.7(L) 9.1  Total Protein 6.5 - 8.1 g/dL - - -  Total Bilirubin 0.3 - 1.2 mg/dL - - -  Alkaline Phos 38 - 126 U/L - - -  AST 15 - 41 U/L - - -  ALT 0 - 44 U/L - - -   CBC Latest Ref Rng & Units 12/09/2019 12/06/2019 10/17/2019  WBC 4.0 - 10.5 K/uL 7.2 6.1 7.1  Hemoglobin 12.0 - 15.0 g/dL 10.9(L) 10.8(L) 11.5  Hematocrit 36.0 - 46.0 % 35.0(L) 35.6(L) 35.2  Platelets 150 - 400 K/uL PLATELET CLUMPS NOTED ON SMEAR, UNABLE TO ESTIMATE PLATELETS APPEAR ADEQUATE CANCELED   External Labs:  Cholesterol, total 138.000 m 12/23/2020 HDL 37.000 mg 12/23/2020 LDL 69.000 mg 05/30/2020 Triglycerides 139.000 m 12/23/2020  A1C 6.700 % 12/07/2019 TSH 3.040 05/09/2019  Hemoglobin 10.900 g/d 12/09/2019 Platelets PAG X10E 10/17/2019  Creatinine, Serum 1.120 mg/ 12/09/2019 Potassium 5.200 mm 12/09/2019 ALT (SGPT) 11.000 IU/ 05/09/2019  Medications and allergies   Allergies  Allergen Reactions   Cephalexin Anaphylaxis and Swelling   Micardis [Telmisartan] Other (See Comments)    Hyperkalemia during hospitalization with pulmonary edema   Atorvastatin     Other reaction(s): myalgia   Buspirone     Other reaction(s): "couldn't breath"   Doxycycline Hyclate     Other reaction(s): nausea   Invokana [Canagliflozin]     Pt does not recall   Lipitor [Atorvastatin Calcium] Other (See Comments)    myalgia   Tramadol Nausea Only     Current Outpatient Medications on File Prior to Visit  Medication Sig Dispense Refill   acetaminophen (TYLENOL) 650 MG CR tablet Take 650 mg by mouth every 8 (eight) hours as needed for pain.     amLODipine (NORVASC) 10 MG tablet Take 1 tablet (10 mg total) by mouth daily. 90 tablet 3   aspirin EC 81 MG tablet Take 81 mg by mouth daily.     B-D UF III MINI PEN NEEDLES 31G X 5 MM MISC SMARTSIG:1 Each SUB-Q Daily     Brinzolamide-Brimonidine (SIMBRINZA) 1-0.2 % SUSP Apply 1 drop to eye in the morning and at bedtime.      COVID-19 mRNA vaccine, Moderna, 100 MCG/0.5ML injection       guaiFENesin-dextromethorphan (ROBITUSSIN DM) 100-10 MG/5ML syrup Take 5 mLs by mouth every 4 (four) hours as needed for cough. 118 mL 0   hydrochlorothiazide (HYDRODIURIL) 25 MG tablet TAKE 1 TABLET(25 MG) BY MOUTH DAILY 90 tablet 1   HYDROcodone bit-homatropine (HYCODAN) 5-1.5  MG/5ML syrup      ipratropium-albuterol (DUONEB) 0.5-2.5 (3) MG/3ML SOLN Take 3 mLs by nebulization every 4 (four) hours as needed. 360 mL 0   isosorbide mononitrate (IMDUR) 60 MG 24 hr tablet TAKE 1 TABLET DAILY 90 tablet 3   liraglutide (VICTOZA) 18 MG/3ML SOPN Inject 0.6 mg into the skin daily.      metFORMIN (GLUCOPHAGE) 1000 MG tablet Take 0.5 tablets (500 mg total) by mouth daily.     predniSONE (DELTASONE) 20 MG tablet Take 2 tablets (40 mg total) by mouth daily. 10 tablet 0   ROCKLATAN 0.02-0.005 % SOLN Place 1 drop into both eyes at bedtime.     telmisartan (MICARDIS) 80 MG tablet      triamcinolone ointment (KENALOG) 0.1 %      No current facility-administered medications on file prior to visit.    Cardiac Studies:   Nuclear stress test  [2015/06/22]:  1. The resting electrocardiogram demonstrated normal sinus rhythm, normal resting conduction and no resting arrhythmias. Stress EKG is non-diagnostic for ischemia as it a pharmacologic stress using Lexiscan. Stress symptoms included dyspnea. 2. Myocardial perfusion imaging is normal. Overall left ventricular systolic function was normal without regional wall motion abnormalities. The left ventricular ejection fraction was 70%. No significant change from 12/19/12.  Carotid Doppler  [12/08/2016]: No hemodynamically significant arterial disease in the internal carotid artery bilaterally. There is mixed plaque bilateral bulb. Antegrade right vertebral artery flow. Antegrade left vertebral artery flow.  Right Heart Cath [12/09/2015]: Mild to moderate pulmonary hypertension. RA 11/10/8, RV 52/5, EDP 11, PA 54/20, mean 34 mmHg. PA saturation 58%. PW 19/25, mean 17  mmHg. Post 600 g nitroglycerin: PA pressure 40/9, mean 23 mmHg. PW 11/11, mean 8 mmHg. CO 5.76, CI 2.77. QP/QS 0.84.  Echocardiogram 10/19/2019:  Normal LV systolic function with visual EF 55-60%. Left ventricle cavity is normal in size. Normal global wall motion. Doppler evidence of grade II diastolic dysfunction, elevated LAP.  Left atrial cavity is mildly dilated.  Mild (Grade I) aortic regurgitation.  Aortic sclerosis without stenosis.  Mild (Grade I) mitral regurgitation.  Mild tricuspid regurgitation. Mild pulmonary hypertension. RVSP measures 44 mmHg.  Compared to prior study 123456: diastolic dysfunction is now grade II with increased LAP, MR and TR have improved.   EKG:   EKG sinus rhythm with first-degree block at the rate of 91 bpm, left regular enlargement, left axis deviation, left anterior fascicular block.  LVH by voltage in aVL.  Poor R wave progression, cannot exclude anteroseptal infarct old.  Single nonconducted PAC.  Occasional PACs conducted.  No significant change from 05/22/2020.   Assessment     ICD-10-CM   1. Essential hypertension  I10 EKG 12-Lead    2. Chronic diastolic CHF (congestive heart failure) (HCC)  I50.32     3. Nocturnal hypoxemia  G47.34     4. Anemia, unspecified type  D64.9     No orders of the defined types were placed in this encounter. There are no discontinued medications. Orders Placed This Encounter  Procedures   EKG 12-Lead   Recommendations:   [name]  is a 87 y.o. aortic stenosis, hypertension, diabetes mellitus, morbid obesity, and sleep apnea. She wears supplemental oxygen at night for sleep apnea as she cannot tolerate CPAP with marked improvement in daytime somnolence and also fatigue.   This is a 91-monthoffice visit for hypertension and chronic diastolic heart failure, she is presently doing well and has not had any worsening dyspnea except  for chronic dyspnea.  She has not had any further leg edema as well.  There  is no clinical evidence of heart failure today.  She has lost about 25 pounds in weight and has maintained weight loss but has had difficulty with further weight loss.  I encouraged her to continue to try.  She maintains sinus rhythm with first-degree block with occasional PACs.  No changes in the medications were done today.  I will see her back in 6 months or sooner if problems.  Encouraged her to continue to use oxygen as her symptoms of heart failure and also fatigue has improved significantly since being on nocturnal home oxygen.    She continues to be mildly anemic and this is chronic.  No obvious GI bleed.   Adrian Prows, MD, [facility] 01/06/2021, 12:05 PM Office: [phone]

## 2021-01-17 DIAGNOSIS — Z0000 Encounter for general adult medical examination without abnormal findings: Secondary | ICD-10-CM | POA: Diagnosis not present

## 2021-01-17 DIAGNOSIS — E1140 Type 2 diabetes mellitus with diabetic neuropathy, unspecified: Secondary | ICD-10-CM | POA: Diagnosis not present

## 2021-01-17 DIAGNOSIS — I5030 Unspecified diastolic (congestive) heart failure: Secondary | ICD-10-CM | POA: Diagnosis not present

## 2021-01-17 DIAGNOSIS — Z9981 Dependence on supplemental oxygen: Secondary | ICD-10-CM | POA: Diagnosis not present

## 2021-01-17 DIAGNOSIS — F419 Anxiety disorder, unspecified: Secondary | ICD-10-CM | POA: Diagnosis not present

## 2021-01-17 DIAGNOSIS — J449 Chronic obstructive pulmonary disease, unspecified: Secondary | ICD-10-CM | POA: Diagnosis not present

## 2021-01-17 DIAGNOSIS — N1831 Chronic kidney disease, stage 3a: Secondary | ICD-10-CM | POA: Diagnosis not present

## 2021-01-17 DIAGNOSIS — E1122 Type 2 diabetes mellitus with diabetic chronic kidney disease: Secondary | ICD-10-CM | POA: Diagnosis not present

## 2021-01-17 DIAGNOSIS — G4733 Obstructive sleep apnea (adult) (pediatric): Secondary | ICD-10-CM | POA: Diagnosis not present

## 2021-01-17 DIAGNOSIS — Z23 Encounter for immunization: Secondary | ICD-10-CM | POA: Diagnosis not present

## 2021-04-01 DIAGNOSIS — M25562 Pain in left knee: Secondary | ICD-10-CM | POA: Diagnosis not present

## 2021-04-29 DIAGNOSIS — L570 Actinic keratosis: Secondary | ICD-10-CM | POA: Diagnosis not present

## 2021-04-29 DIAGNOSIS — X32XXXD Exposure to sunlight, subsequent encounter: Secondary | ICD-10-CM | POA: Diagnosis not present

## 2021-04-29 DIAGNOSIS — C44311 Basal cell carcinoma of skin of nose: Secondary | ICD-10-CM | POA: Diagnosis not present

## 2021-05-06 ENCOUNTER — Other Ambulatory Visit: Payer: Self-pay | Admitting: Cardiology

## 2021-05-06 DIAGNOSIS — I10 Essential (primary) hypertension: Secondary | ICD-10-CM

## 2021-05-09 ENCOUNTER — Other Ambulatory Visit: Payer: Self-pay | Admitting: Cardiology

## 2021-05-09 DIAGNOSIS — I5032 Chronic diastolic (congestive) heart failure: Secondary | ICD-10-CM

## 2021-06-16 ENCOUNTER — Encounter (HOSPITAL_COMMUNITY): Payer: Self-pay

## 2021-06-16 ENCOUNTER — Emergency Department (HOSPITAL_COMMUNITY)
Admission: EM | Admit: 2021-06-16 | Discharge: 2021-06-17 | Disposition: A | Discharge status: Home / Self Care | Payer: Medicare Other | Attending: Emergency Medicine | Admitting: Emergency Medicine

## 2021-06-16 ENCOUNTER — Emergency Department (HOSPITAL_COMMUNITY): Payer: Medicare Other

## 2021-06-16 ENCOUNTER — Other Ambulatory Visit: Payer: Self-pay

## 2021-06-16 DIAGNOSIS — J449 Chronic obstructive pulmonary disease, unspecified: Secondary | ICD-10-CM | POA: Diagnosis not present

## 2021-06-16 DIAGNOSIS — R079 Chest pain, unspecified: Secondary | ICD-10-CM | POA: Diagnosis not present

## 2021-06-16 DIAGNOSIS — R0602 Shortness of breath: Secondary | ICD-10-CM | POA: Insufficient documentation

## 2021-06-16 DIAGNOSIS — I110 Hypertensive heart disease with heart failure: Secondary | ICD-10-CM | POA: Diagnosis not present

## 2021-06-16 DIAGNOSIS — E119 Type 2 diabetes mellitus without complications: Secondary | ICD-10-CM | POA: Diagnosis not present

## 2021-06-16 DIAGNOSIS — I509 Heart failure, unspecified: Secondary | ICD-10-CM | POA: Diagnosis not present

## 2021-06-16 DIAGNOSIS — I10 Essential (primary) hypertension: Secondary | ICD-10-CM | POA: Diagnosis not present

## 2021-06-16 DIAGNOSIS — R0789 Other chest pain: Secondary | ICD-10-CM | POA: Diagnosis not present

## 2021-06-16 LAB — BASIC METABOLIC PANEL
Anion gap: 9 (ref 5–15)
BUN: 36 mg/dL — ABNORMAL HIGH (ref 8–23)
CO2: 25 mmol/L (ref 22–32)
Calcium: 8.9 mg/dL (ref 8.9–10.3)
Chloride: 101 mmol/L (ref 98–111)
Creatinine, Ser: 1.32 mg/dL — ABNORMAL HIGH (ref 0.44–1.00)
GFR, Estimated: 39 mL/min — ABNORMAL LOW (ref >60)
Glucose, Bld: 246 mg/dL — ABNORMAL HIGH (ref 70–99)
Potassium: 4.6 mmol/L (ref 3.5–5.1)
Sodium: 135 mmol/L (ref 135–145)

## 2021-06-16 LAB — CBC
HCT: 37.8 % (ref 36.0–46.0)
Hemoglobin: 11.7 g/dL — ABNORMAL LOW (ref 12.0–15.0)
MCH: 29.2 pg (ref 26.0–34.0)
MCHC: 31 g/dL (ref 30.0–36.0)
MCV: 94.3 fL (ref 80.0–100.0)
Platelets: UNDETERMINED 10*3/uL (ref 150–400)
RBC: 4.01 MIL/uL (ref 3.87–5.11)
RDW: 13.4 % (ref 11.5–15.5)
WBC: 11.8 10*3/uL — ABNORMAL HIGH (ref 4.0–10.5)
nRBC: 0 % (ref 0.0–0.2)

## 2021-06-16 LAB — TROPONIN I (HIGH SENSITIVITY): Troponin I (High Sensitivity): 9 ng/L (ref <18)

## 2021-06-16 NOTE — ED Provider Triage Note (Signed)
Emergency Medicine Provider Triage Evaluation Note  HALIMA FOGAL , a 88 y.o. female  was evaluated in triage.  Pt complains of sternal chest pain and chest tightness starting at 6:45pm this evening. Hx of CHF and COPD. Was feeling fine up until today.  Review of Systems  Positive: Chest pain, SOB Negative: Fever, chills, cough, abd pain, N/V  Physical Exam  BP 135/70    Pulse 100    Temp 98.2 F (36.8 C)    Resp (!) 24    SpO2 95%  Gen:   Awake, no distress   Resp:  Normal effort  MSK:   Moves extremities without difficulty  Other:    Medical Decision Making  Medically screening exam initiated at 8:29 PM.  Appropriate orders placed.  ZAYLIA RIOLO was informed that the remainder of the evaluation will be completed by another provider, this initial triage assessment does not replace that evaluation, and the importance of remaining in the ED until their evaluation is complete.     Kateri Plummer, Hershal Coria 06/16/21 2029

## 2021-06-16 NOTE — ED Triage Notes (Signed)
Arrives [facility] with sternal cp and chest tightness beginning at [place]. Hx chf, and copd.

## 2021-06-17 LAB — TROPONIN I (HIGH SENSITIVITY): Troponin I (High Sensitivity): 10 ng/L (ref <18)

## 2021-06-17 NOTE — ED Provider Notes (Signed)
[facility] Emergency Department Provider Note MRN:  [id number]  Arrival date & time: 06/17/21     Chief Complaint   Chest Pain   History of Present Illness   [name] is a 88 y.o. year-old female with a history of CHF, COPD, diabetes presenting to the ED with chief complaint of chest pain.  Gradual onset chest pain yesterday afternoon, mild, became more severe later in the day and when she tried to lay down and go to bed.  Associated with some shortness of breath.  No dizziness or diaphoresis, no nausea vomiting.  No abdominal pain no recent fever or cough.  Feels a lot better at this time after taking some Tylenol.  Review of Systems  A thorough review of systems was obtained and all systems are negative except as noted in the HPI and PMH.   Patient's Health History    Past Medical History:  Diagnosis Date   Cellulitis and abscess of left leg    CHF (congestive heart failure) (HCC)    COPD (chronic obstructive pulmonary disease) ([place])    Diabetes mellitus    Glaucoma    Hyperlipidemia    Hypertension     Past Surgical History:  Procedure Laterality Date   BACK SURGERY     CARDIAC CATHETERIZATION N/A 12/09/2015   Procedure: Right Heart Cath;  Surgeon: Adrian Prows, MD;  Location: [place] [facility];  Service: Cardiovascular;  Laterality: N/A;   CHOLECYSTECTOMY  2000   HERNIA REPAIR  2007   rupture disk  1970"s   SHOULDER ARTHROSCOPY WITH ROTATOR CUFF REPAIR  1999   rt shoulder    Family History  Problem Relation Age of Onset   Stroke Mother     Social History   Socioeconomic History   Marital status: Married    Spouse name: Not on file   Number of children: 5   Years of education: Not on file   Highest education level: Not on file  Occupational History   Not on file  Tobacco Use   Smoking status: Never   Smokeless tobacco: Never  Vaping Use   Vaping Use: Never used  Substance and Sexual Activity   Alcohol use: No   Drug use:  No   Sexual activity: Not on file  Other Topics Concern   Not on file  Social History Narrative   Not on file   Social Determinants of Health   Financial Resource Strain: Not on file  Food Insecurity: Not on file  Transportation Needs: Not on file  Physical Activity: Not on file  Stress: Not on file  Social Connections: Not on file  Intimate Partner Violence: Not on file     Physical Exam   Vitals:   06/16/21 2346 06/17/21 0238  BP: (!) 134/59 132/63  Pulse: 81 73  Resp: 18 19  Temp: 98.2 F (36.8 C)   SpO2: 93% 96%    CONSTITUTIONAL: Well-appearing, NAD NEURO/PSYCH:  Alert and oriented x 3, no focal deficits EYES:  eyes equal and reactive ENT/NECK:  no LAD, no JVD CARDIO: Regular rate, well-perfused, normal S1 and S2 PULM:  CTAB no wheezing or rhonchi GI/GU:  non-distended, non-tender MSK/SPINE:  No gross deformities, no edema SKIN:  no rash, atraumatic   *Additional and/or pertinent findings included in MDM below  Diagnostic and Interventional Summary    EKG Interpretation  Date/Time:  Monday June 16 2021 20:30:12 EST Ventricular Rate:  99 PR Interval:  258 QRS Duration: 84  QT Interval:  340 QTC Calculation: 436 R Axis:   -53 Text Interpretation: Sinus rhythm with 1st degree A-V block Left axis deviation Minimal voltage criteria for LVH, may be normal variant ( R in aVL ) Abnormal ECG When compared with ECG of 28-Nov-2020 08:32, PREVIOUS ECG IS PRESENT Confirmed by Gerlene Fee [id number]) on 06/17/2021 3:32:22 AM       Labs Reviewed  BASIC METABOLIC PANEL - Abnormal; Notable for the following components:      Result Value   Glucose, Bld 246 (*)    BUN 36 (*)    Creatinine, Ser 1.32 (*)    GFR, Estimated 39 (*)    All other components within normal limits  CBC - Abnormal; Notable for the following components:   WBC 11.8 (*)    Hemoglobin 11.7 (*)    All other components within normal limits  TROPONIN I (HIGH SENSITIVITY)  TROPONIN I (HIGH  SENSITIVITY)    DG Chest 2 View  Final Result      Medications - No data to display   Procedures  /  Critical Care Procedures  ED Course and Medical Decision Making  Initial Impression and Ddx Chest pain, shortness of breath worse when laying flat.  Question GERD, orthopnea related to underlying CHF, ACS also considered.  Past medical/surgical history that increases complexity of ED encounter: CHF  Interpretation of Diagnostics I personally reviewed the EKG and Chest Xray and my interpretation is as follows: Sinus rhythm, no ischemic findings, chest x-ray without obvious edema or lobar pneumonia    Labs are reassuring with no significant blood count or electrolyte disturbance, troponin is negative x2.  Patient Reassessment and Ultimate Disposition/Management Given patient's reassuring work-up, well-appearing nature at this time, pain largely resolved after Tylenol there seems to be no indication for further testing or admission.  Appropriate for discharge with return precautions, close follow-up.  Patient management required discussion with the following services or consulting groups:  None  Complexity of Problems Addressed Acute illness or injury that poses threat of life of bodily function  Additional Data Reviewed and Analyzed Further history obtained from: Further history from spouse/family member  Additional Factors Impacting ED Encounter Risk Consideration of hospitalization  Barth Kirks. [name], [place] [email or web address]  Final Clinical Impressions(s) / ED Diagnoses     ICD-10-CM   1. Chest pain, unspecified type  R07.9       ED Discharge Orders     None        Discharge Instructions Discussed with and Provided to Patient:    Discharge Instructions      You were evaluated in the Emergency Department and after careful evaluation, we did not find any emergent condition requiring admission or further  testing in the hospital.  Your exam/testing today is overall reassuring.  No signs of heart attack or any other emergencies.  Recommend close follow-up with your primary care doctor and/or cardiologist.  Please return to the Emergency Department if you experience any worsening of your condition.   Thank you for allowing [place] to be a part of your care.      Maudie Flakes, MD 06/17/21 [phone]

## 2021-06-17 NOTE — Discharge Instructions (Signed)
You were evaluated in the Emergency Department and after careful evaluation, we did not find any emergent condition requiring admission or further testing in the hospital.  Your exam/testing today is overall reassuring.  No signs of heart attack or any other emergencies.  Recommend close follow-up with your primary care doctor and/or cardiologist.  Please return to the Emergency Department if you experience any worsening of your condition.   Thank you for allowing [place] to be a part of your care.

## 2021-06-18 ENCOUNTER — Other Ambulatory Visit: Payer: Self-pay

## 2021-06-18 ENCOUNTER — Encounter: Payer: Self-pay | Admitting: Cardiology

## 2021-06-18 DIAGNOSIS — I5032 Chronic diastolic (congestive) heart failure: Secondary | ICD-10-CM

## 2021-06-18 MED ORDER — HYDROCHLOROTHIAZIDE 25 MG PO TABS: 25.0000 mg | ORAL_TABLET | Freq: Every day | ORAL | 1 refills | Status: DC

## 2021-07-07 ENCOUNTER — Ambulatory Visit: Payer: Medicare Other | Admitting: Cardiology

## 2021-07-07 ENCOUNTER — Other Ambulatory Visit: Payer: Self-pay

## 2021-07-07 ENCOUNTER — Encounter: Payer: Self-pay | Admitting: Cardiology

## 2021-07-07 VITALS — BP 114/69 | Temp 97.7°F | Resp 16 | Ht 64.0 in | Wt 206.0 lb

## 2021-07-07 DIAGNOSIS — G4734 Idiopathic sleep related nonobstructive alveolar hypoventilation: Secondary | ICD-10-CM | POA: Diagnosis not present

## 2021-07-07 DIAGNOSIS — I10 Essential (primary) hypertension: Secondary | ICD-10-CM | POA: Diagnosis not present

## 2021-07-07 DIAGNOSIS — I5032 Chronic diastolic (congestive) heart failure: Secondary | ICD-10-CM

## 2021-07-07 NOTE — Progress Notes (Signed)
Primary Physician/Referring:  Teresa Morning, DO  Patient ID: [name], female    DOB: June 17, 1930, 88 y.o.   MRN: [id number]  Chief Complaint  Patient presents with   Congestive Heart Failure   Hypertension   Follow-up    6 month   HPI:    HPI: [name]  is a 88 y.o. female  aortic stenosis, hypertension, diabetes mellitus, morbid obesity, and sleep apnea. She wears supplemental oxygen at night for sleep apnea as she cannot tolerate CPAP with marked improvement in daytime somnolence and also fatigue.   She was seen in the emergency room for chest pain and dyspnea on 06/16/2021, ruled out for myocardial infarction and discharged home.  She has not had any further episodes of chest pain, she is feeling the best she has in quite a while, dyspnea has remained stable.  No leg edema, no PND or orthopnea.   Past Medical History:  Diagnosis Date   Cellulitis and abscess of left leg    CHF (congestive heart failure) (HCC)    COPD (chronic obstructive pulmonary disease) ([place])    Diabetes mellitus    Glaucoma    Hyperlipidemia    Hypertension    Past Surgical History:  Procedure Laterality Date   BACK SURGERY     CARDIAC CATHETERIZATION N/A 12/09/2015   Procedure: Right Heart Cath;  Surgeon: Adrian Prows, MD;  Location: [place] [facility];  Service: Cardiovascular;  Laterality: N/A;   CHOLECYSTECTOMY  2000   HERNIA REPAIR  2007   rupture disk  1970"s   SHOULDER ARTHROSCOPY WITH ROTATOR CUFF REPAIR  1999   rt shoulder   Social History   Tobacco Use   Smoking status: Never   Smokeless tobacco: Never  Substance Use Topics   Alcohol use: No   Marital Status: Married   ROS  Review of Systems  Cardiovascular:  Positive for dyspnea on exertion (stable and improved). Negative for chest pain, leg swelling and orthopnea.  Gastrointestinal:  Negative for melena.  Objective  Blood pressure 114/69, temperature 97.7 F (36.5 C), resp. rate 16, height _0  (1.626 m), weight 206  lb (93.4 kg). Body mass index is 35.36 kg/m.   Vitals with BMI 07/07/2021 06/17/2021 06/17/2021  Height _1  - -  Weight 206 lbs - -  BMI 33.61 - -  Systolic 224 497 530  Diastolic 69 70 63  Pulse - 76 73   Physical Exam Constitutional:      Comments: Morbidly obese  Neck:     Thyroid: No thyromegaly.     Vascular: No JVD.     Comments: Short neck and difficult to evaluate JVP Cardiovascular:     Rate and Rhythm: Normal rate and regular rhythm.     Pulses: Normal pulses and intact distal pulses.          Carotid pulses are 2+ on the right side with bruit and 2+ on the left side with bruit.      Dorsalis pedis pulses are 2+ on the right side and 2+ on the left side.       Posterior tibial pulses are 2+ on the right side and 2+ on the left side.     Heart sounds: Murmur heard.  High-pitched harsh midsystolic murmur is present with a grade of 2/6 at the upper right sternal border.  Blowing holosystolic murmur of grade 3/6 is also present at the apex.    No gallop.  Pulmonary:  Breath sounds: Normal breath sounds.  Abdominal:     General: Bowel sounds are normal.     Palpations: Abdomen is soft.     Comments: Obese. Pannus present  Musculoskeletal:     Right lower leg: No edema.     Left lower leg: No edema.   Radiology: No results found.  Laboratory examination:   CMP Latest Ref Rng & Units 06/16/2021 12/09/2019 12/08/2019  Glucose 70 - 99 mg/dL 246(H) 138(H) 116(H)  BUN 8 - 23 mg/dL 36(H) 22 19  Creatinine 0.44 - 1.00 mg/dL 1.32(H) 1.12(H) 1.19(H)  Sodium 135 - 145 mmol/L 135 131(L) 130(L)  Potassium 3.5 - 5.1 mmol/L 4.6 5.2(H) 5.4(H)  Chloride 98 - 111 mmol/L 101 96(L) 97(L)  CO2 22 - 32 mmol/L _0 Calcium 8.9 - 10.3 mg/dL 8.9 8.5(L) 8.7(L)  Total Protein 6.5 - 8.1 g/dL - - -  Total Bilirubin 0.3 - 1.2 mg/dL - - -  Alkaline Phos 38 - 126 U/L - - -  AST 15 - 41 U/L - - -  ALT 0 - 44 U/L - - -   CBC Latest Ref Rng & Units 06/16/2021 12/09/2019 12/06/2019  WBC  4.0 - 10.5 K/uL 11.8(H) 7.2 6.1  Hemoglobin 12.0 - 15.0 g/dL 11.7(L) 10.9(L) 10.8(L)  Hematocrit 36.0 - 46.0 % 37.8 35.0(L) 35.6(L)  Platelets 150 - 400 K/uL PLATELET CLUMPS NOTED ON SMEAR, UNABLE TO ESTIMATE PLATELET CLUMPS NOTED ON SMEAR, UNABLE TO ESTIMATE PLATELETS APPEAR ADEQUATE   External Labs: Labs 12/23/2020:  A1c 7.7%.  TSH normal at 4.44.  Hb 11.5/HCT 36.0, platelets clumped.  BUN 26, creatinine 1.27, EGFR 44 mL, potassium 4.9.  Total cholesterol 138, triglycerides 139, HDL 37, LDL 73.  Medications and allergies   Allergies  Allergen Reactions   Cephalexin Anaphylaxis, Swelling and Other (See Comments)   Micardis [Telmisartan] Other (See Comments)    Hyperkalemia during hospitalization with pulmonary edema   Atorvastatin Other (See Comments)    Other reaction(s): myalgia   Buspirone Other (See Comments)    Other reaction(s): "couldn't breath"   Canagliflozin Other (See Comments)    Pt does not recall   Doxycycline Hyclate Other (See Comments)    Other reaction(s): nausea   Lipitor [Atorvastatin Calcium] Other (See Comments)    myalgia   Tramadol Nausea Only   Tramadol Hcl Other (See Comments)     Current Outpatient Medications:    acetaminophen (TYLENOL) 650 MG CR tablet, Take 650 mg by mouth every 8 (eight) hours as needed for pain., Disp: , Rfl:    amLODipine (NORVASC) 10 MG tablet, TAKE 1 TABLET(10 MG) BY MOUTH DAILY, Disp: 90 tablet, Rfl: 3   Brinzolamide-Brimonidine (SIMBRINZA) 1-0.2 % SUSP, Apply 1 drop to eye in the Small and at bedtime. , Disp: , Rfl:    COVID-19 mRNA vaccine, Moderna, 100 MCG/0.5ML injection, , Disp: , Rfl:    guaiFENesin-dextromethorphan (ROBITUSSIN DM) 100-10 MG/5ML syrup, Take 5 mLs by mouth every 4 (four) hours as needed for cough., Disp: 118 mL, Rfl: 0   hydrochlorothiazide (HYDRODIURIL) 25 MG tablet, Take 1 tablet (25 mg total) by mouth daily., Disp: 90 tablet, Rfl: 1   ipratropium-albuterol (DUONEB) 0.5-2.5 (3) MG/3ML SOLN,  Take 3 mLs by nebulization every 4 (four) hours as needed., Disp: 360 mL, Rfl: 0   isosorbide mononitrate (IMDUR) 60 MG 24 hr tablet, TAKE 1 TABLET DAILY, Disp: 90 tablet, Rfl: 3   liraglutide (VICTOZA) 18 MG/3ML SOPN, Inject 0.6 mg into the skin daily. ,  Disp: , Rfl:    metFORMIN (GLUCOPHAGE) 1000 MG tablet, Take 0.5 tablets (500 mg total) by mouth daily., Disp: , Rfl:    predniSONE (DELTASONE) 20 MG tablet, Take 2 tablets (40 mg total) by mouth daily., Disp: 10 tablet, Rfl: 0   ROCKLATAN 0.02-0.005 % SOLN, Place 1 drop into both eyes at bedtime., Disp: , Rfl:    telmisartan (MICARDIS) 80 MG tablet, , Disp: , Rfl:    triamcinolone ointment (KENALOG) 0.1 %, , Disp: , Rfl:     Cardiac Studies:   Nuclear stress test  [06/08/2015]:  1. The resting electrocardiogram demonstrated normal sinus rhythm, normal resting conduction and no resting arrhythmias. Stress EKG is non-diagnostic for ischemia as it a pharmacologic stress using Lexiscan. Stress symptoms included dyspnea. 2. Myocardial perfusion imaging is normal. Overall left ventricular systolic function was normal without regional wall motion abnormalities. The left ventricular ejection fraction was 70%. No significant change from 12/19/12.  Carotid Doppler  [12/08/2016]: No hemodynamically significant arterial disease in the internal carotid artery bilaterally. There is mixed plaque bilateral bulb. Antegrade right vertebral artery flow. Antegrade left vertebral artery flow.  Right Heart Cath [12/09/2015]: Mild to moderate pulmonary hypertension. RA 11/10/8, RV 52/5, EDP 11, PA 54/20, mean 34 mmHg. PA saturation 58%. PW 19/25, mean 17 mmHg. Post 600 g nitroglycerin: PA pressure 40/9, mean 23 mmHg. PW 11/11, mean 8 mmHg. CO 5.76, CI 2.77. QP/QS 0.84.  Echocardiogram 10/19/2019:  Normal LV systolic function with visual EF 55-60%. Left ventricle cavity is normal in size. Normal global wall motion. Doppler evidence of grade II diastolic dysfunction,  elevated LAP.  Left atrial cavity is mildly dilated.  Mild (Grade I) aortic regurgitation.  Aortic sclerosis without stenosis.  Mild (Grade I) mitral regurgitation.  Mild tricuspid regurgitation. Mild pulmonary hypertension. RVSP measures 44 mmHg.  Compared to prior study 0/12/6281: diastolic dysfunction is now grade II with increased LAP, MR and TR have improved.   EKG:   EKG 07/07/2021: Sinus rhythm with first-degree block at rate of 83 bpm, left atrial enlargement, left axis deviation, left anterior fascicular block.  Poor R wave progression, cannot exclude anterolateral infarct old.  LVH.  No significant change from prior EKG.   Assessment     ICD-10-CM   1. Chronic diastolic CHF (congestive heart failure) (HCC)  I50.32 EKG 12-Lead    2. Essential hypertension  I10     3. Nocturnal hypoxemia  G47.34     No orders of the defined types were placed in this encounter.  Medications Discontinued During This Encounter  Medication Reason   B-D UF III MINI PEN NEEDLES 31G X 5 MM MISC    aspirin EC 81 MG tablet    HYDROcodone bit-homatropine (HYCODAN) 5-1.5 MG/5ML syrup    Orders Placed This Encounter  Procedures   EKG 12-Lead   Recommendations:   MIRELY PANGLE  is a 88 y.o. aortic stenosis, hypertension, diabetes mellitus, morbid obesity, and sleep apnea. She wears supplemental oxygen at night for sleep apnea as she cannot tolerate CPAP with marked improvement in daytime somnolence and also fatigue.   This is a 32-monthoffice visit for hypertension and chronic diastolic heart failure, she is presently doing well and has not had any worsening dyspnea except for chronic dyspnea.  She has not had any further leg edema as well.  There is no clinical evidence of heart failure today.  I also reviewed her hospital records from recent emergency room visit, cardiac troponins were negative, no significant  heart failure on x-ray as well.  Overall she is doing well, blood pressure is well  controlled, CBC stable with mild anemia that is chronic.  No bleeding diathesis.  I will see her back in a year.  I have encouraged her to continue to lose weight.   Adrian Prows, MD, [facility] 07/07/2021, 2:19 PM Office: [phone]

## 2021-07-17 DIAGNOSIS — E1122 Type 2 diabetes mellitus with diabetic chronic kidney disease: Secondary | ICD-10-CM | POA: Diagnosis not present

## 2021-07-17 DIAGNOSIS — I5030 Unspecified diastolic (congestive) heart failure: Secondary | ICD-10-CM | POA: Diagnosis not present

## 2021-07-17 DIAGNOSIS — E7800 Pure hypercholesterolemia, unspecified: Secondary | ICD-10-CM | POA: Diagnosis not present

## 2021-07-17 DIAGNOSIS — N1831 Chronic kidney disease, stage 3a: Secondary | ICD-10-CM | POA: Diagnosis not present

## 2021-07-23 DIAGNOSIS — R32 Unspecified urinary incontinence: Secondary | ICD-10-CM | POA: Diagnosis not present

## 2021-07-23 DIAGNOSIS — I272 Pulmonary hypertension, unspecified: Secondary | ICD-10-CM | POA: Diagnosis not present

## 2021-07-23 DIAGNOSIS — E1140 Type 2 diabetes mellitus with diabetic neuropathy, unspecified: Secondary | ICD-10-CM | POA: Diagnosis not present

## 2021-07-23 DIAGNOSIS — E1122 Type 2 diabetes mellitus with diabetic chronic kidney disease: Secondary | ICD-10-CM | POA: Diagnosis not present

## 2021-07-23 DIAGNOSIS — E7800 Pure hypercholesterolemia, unspecified: Secondary | ICD-10-CM | POA: Diagnosis not present

## 2021-07-23 DIAGNOSIS — I5030 Unspecified diastolic (congestive) heart failure: Secondary | ICD-10-CM | POA: Diagnosis not present

## 2021-07-23 DIAGNOSIS — G4733 Obstructive sleep apnea (adult) (pediatric): Secondary | ICD-10-CM | POA: Diagnosis not present

## 2021-07-23 DIAGNOSIS — Z9981 Dependence on supplemental oxygen: Secondary | ICD-10-CM | POA: Diagnosis not present

## 2021-07-23 DIAGNOSIS — J449 Chronic obstructive pulmonary disease, unspecified: Secondary | ICD-10-CM | POA: Diagnosis not present

## 2021-07-23 DIAGNOSIS — N1831 Chronic kidney disease, stage 3a: Secondary | ICD-10-CM | POA: Diagnosis not present

## 2021-08-20 DIAGNOSIS — E113292 Type 2 diabetes mellitus with mild nonproliferative diabetic retinopathy without macular edema, left eye: Secondary | ICD-10-CM | POA: Diagnosis not present

## 2021-08-20 DIAGNOSIS — H5213 Myopia, bilateral: Secondary | ICD-10-CM | POA: Diagnosis not present

## 2021-08-20 DIAGNOSIS — H26491 Other secondary cataract, right eye: Secondary | ICD-10-CM | POA: Diagnosis not present

## 2021-08-20 DIAGNOSIS — H401131 Primary open-angle glaucoma, bilateral, mild stage: Secondary | ICD-10-CM | POA: Diagnosis not present

## 2021-09-15 ENCOUNTER — Other Ambulatory Visit: Payer: Self-pay | Admitting: Cardiology

## 2021-10-21 ENCOUNTER — Other Ambulatory Visit: Payer: Self-pay

## 2021-10-21 DIAGNOSIS — I5032 Chronic diastolic (congestive) heart failure: Secondary | ICD-10-CM

## 2021-10-21 DIAGNOSIS — I10 Essential (primary) hypertension: Secondary | ICD-10-CM

## 2021-10-21 MED ORDER — AMLODIPINE BESYLATE 10 MG PO TABS: ORAL_TABLET | ORAL | 3 refills | Status: DC

## 2021-10-21 MED ORDER — HYDROCHLOROTHIAZIDE 25 MG PO TABS: 25.0000 mg | ORAL_TABLET | Freq: Every day | ORAL | 1 refills | Status: DC

## 2021-11-13 DIAGNOSIS — E1122 Type 2 diabetes mellitus with diabetic chronic kidney disease: Secondary | ICD-10-CM | POA: Diagnosis not present

## 2021-11-13 DIAGNOSIS — N1831 Chronic kidney disease, stage 3a: Secondary | ICD-10-CM | POA: Diagnosis not present

## 2021-11-13 DIAGNOSIS — I5030 Unspecified diastolic (congestive) heart failure: Secondary | ICD-10-CM | POA: Diagnosis not present

## 2021-11-20 DIAGNOSIS — E785 Hyperlipidemia, unspecified: Secondary | ICD-10-CM | POA: Diagnosis not present

## 2021-11-20 DIAGNOSIS — E1140 Type 2 diabetes mellitus with diabetic neuropathy, unspecified: Secondary | ICD-10-CM | POA: Diagnosis not present

## 2021-11-20 DIAGNOSIS — I129 Hypertensive chronic kidney disease with stage 1 through stage 4 chronic kidney disease, or unspecified chronic kidney disease: Secondary | ICD-10-CM | POA: Diagnosis not present

## 2021-11-20 DIAGNOSIS — I5030 Unspecified diastolic (congestive) heart failure: Secondary | ICD-10-CM | POA: Diagnosis not present

## 2021-11-20 DIAGNOSIS — E538 Deficiency of other specified B group vitamins: Secondary | ICD-10-CM | POA: Diagnosis not present

## 2021-11-20 DIAGNOSIS — Z9981 Dependence on supplemental oxygen: Secondary | ICD-10-CM | POA: Diagnosis not present

## 2021-11-20 DIAGNOSIS — E1122 Type 2 diabetes mellitus with diabetic chronic kidney disease: Secondary | ICD-10-CM | POA: Diagnosis not present

## 2021-11-20 DIAGNOSIS — I272 Pulmonary hypertension, unspecified: Secondary | ICD-10-CM | POA: Diagnosis not present

## 2021-11-20 DIAGNOSIS — I350 Nonrheumatic aortic (valve) stenosis: Secondary | ICD-10-CM | POA: Diagnosis not present

## 2021-11-20 DIAGNOSIS — N1831 Chronic kidney disease, stage 3a: Secondary | ICD-10-CM | POA: Diagnosis not present

## 2021-11-20 DIAGNOSIS — J449 Chronic obstructive pulmonary disease, unspecified: Secondary | ICD-10-CM | POA: Diagnosis not present

## 2021-12-02 DIAGNOSIS — L820 Inflamed seborrheic keratosis: Secondary | ICD-10-CM | POA: Diagnosis not present

## 2021-12-02 DIAGNOSIS — D045 Carcinoma in situ of skin of trunk: Secondary | ICD-10-CM | POA: Diagnosis not present

## 2021-12-22 DIAGNOSIS — H401121 Primary open-angle glaucoma, left eye, mild stage: Secondary | ICD-10-CM | POA: Diagnosis not present

## 2022-01-23 DIAGNOSIS — Z23 Encounter for immunization: Secondary | ICD-10-CM | POA: Diagnosis not present

## 2022-02-10 DIAGNOSIS — M25562 Pain in left knee: Secondary | ICD-10-CM | POA: Diagnosis not present

## 2022-02-13 DIAGNOSIS — L820 Inflamed seborrheic keratosis: Secondary | ICD-10-CM | POA: Diagnosis not present

## 2022-02-13 DIAGNOSIS — B078 Other viral warts: Secondary | ICD-10-CM | POA: Diagnosis not present

## 2022-02-13 DIAGNOSIS — L570 Actinic keratosis: Secondary | ICD-10-CM | POA: Diagnosis not present

## 2022-02-13 DIAGNOSIS — X32XXXD Exposure to sunlight, subsequent encounter: Secondary | ICD-10-CM | POA: Diagnosis not present

## 2022-02-13 DIAGNOSIS — D045 Carcinoma in situ of skin of trunk: Secondary | ICD-10-CM | POA: Diagnosis not present

## 2022-05-03 ENCOUNTER — Inpatient Hospital Stay (HOSPITAL_COMMUNITY): Payer: Medicare Other

## 2022-05-03 ENCOUNTER — Emergency Department (HOSPITAL_COMMUNITY): Payer: Medicare Other

## 2022-05-03 ENCOUNTER — Inpatient Hospital Stay (HOSPITAL_COMMUNITY)
Admission: EM | Admit: 2022-05-03 | Discharge: 2022-05-07 | DRG: 178 | Disposition: A | Discharge status: Home Health | Payer: Medicare Other | Attending: Internal Medicine | Admitting: Internal Medicine

## 2022-05-03 ENCOUNTER — Other Ambulatory Visit: Payer: Self-pay

## 2022-05-03 ENCOUNTER — Encounter (HOSPITAL_COMMUNITY): Payer: Self-pay

## 2022-05-03 DIAGNOSIS — R7989 Other specified abnormal findings of blood chemistry: Secondary | ICD-10-CM | POA: Diagnosis not present

## 2022-05-03 DIAGNOSIS — I130 Hypertensive heart and chronic kidney disease with heart failure and stage 1 through stage 4 chronic kidney disease, or unspecified chronic kidney disease: Secondary | ICD-10-CM | POA: Diagnosis present

## 2022-05-03 DIAGNOSIS — J441 Chronic obstructive pulmonary disease with (acute) exacerbation: Secondary | ICD-10-CM | POA: Diagnosis not present

## 2022-05-03 DIAGNOSIS — D696 Thrombocytopenia, unspecified: Secondary | ICD-10-CM | POA: Diagnosis present

## 2022-05-03 DIAGNOSIS — E8809 Other disorders of plasma-protein metabolism, not elsewhere classified: Secondary | ICD-10-CM | POA: Diagnosis not present

## 2022-05-03 DIAGNOSIS — Z7984 Long term (current) use of oral hypoglycemic drugs: Secondary | ICD-10-CM | POA: Diagnosis not present

## 2022-05-03 DIAGNOSIS — J9611 Chronic respiratory failure with hypoxia: Secondary | ICD-10-CM | POA: Diagnosis present

## 2022-05-03 DIAGNOSIS — E669 Obesity, unspecified: Secondary | ICD-10-CM | POA: Diagnosis present

## 2022-05-03 DIAGNOSIS — U071 COVID-19: Secondary | ICD-10-CM | POA: Diagnosis not present

## 2022-05-03 DIAGNOSIS — R7401 Elevation of levels of liver transaminase levels: Secondary | ICD-10-CM | POA: Diagnosis present

## 2022-05-03 DIAGNOSIS — E785 Hyperlipidemia, unspecified: Secondary | ICD-10-CM | POA: Diagnosis present

## 2022-05-03 DIAGNOSIS — N1832 Chronic kidney disease, stage 3b: Secondary | ICD-10-CM | POA: Diagnosis not present

## 2022-05-03 DIAGNOSIS — E1122 Type 2 diabetes mellitus with diabetic chronic kidney disease: Secondary | ICD-10-CM | POA: Diagnosis not present

## 2022-05-03 DIAGNOSIS — Z881 Allergy status to other antibiotic agents status: Secondary | ICD-10-CM

## 2022-05-03 DIAGNOSIS — I82442 Acute embolism and thrombosis of left tibial vein: Secondary | ICD-10-CM | POA: Diagnosis present

## 2022-05-03 DIAGNOSIS — N183 Chronic kidney disease, stage 3 unspecified: Secondary | ICD-10-CM | POA: Diagnosis not present

## 2022-05-03 DIAGNOSIS — Z9049 Acquired absence of other specified parts of digestive tract: Secondary | ICD-10-CM

## 2022-05-03 DIAGNOSIS — H409 Unspecified glaucoma: Secondary | ICD-10-CM | POA: Diagnosis present

## 2022-05-03 DIAGNOSIS — I82403 Acute embolism and thrombosis of unspecified deep veins of lower extremity, bilateral: Secondary | ICD-10-CM | POA: Diagnosis present

## 2022-05-03 DIAGNOSIS — J449 Chronic obstructive pulmonary disease, unspecified: Secondary | ICD-10-CM | POA: Diagnosis present

## 2022-05-03 DIAGNOSIS — D631 Anemia in chronic kidney disease: Secondary | ICD-10-CM | POA: Diagnosis not present

## 2022-05-03 DIAGNOSIS — H9190 Unspecified hearing loss, unspecified ear: Secondary | ICD-10-CM | POA: Diagnosis present

## 2022-05-03 DIAGNOSIS — I82431 Acute embolism and thrombosis of right popliteal vein: Secondary | ICD-10-CM | POA: Diagnosis not present

## 2022-05-03 DIAGNOSIS — I5032 Chronic diastolic (congestive) heart failure: Secondary | ICD-10-CM | POA: Diagnosis present

## 2022-05-03 DIAGNOSIS — I10 Essential (primary) hypertension: Secondary | ICD-10-CM | POA: Diagnosis not present

## 2022-05-03 DIAGNOSIS — Z888 Allergy status to other drugs, medicaments and biological substances status: Secondary | ICD-10-CM

## 2022-05-03 DIAGNOSIS — N179 Acute kidney failure, unspecified: Secondary | ICD-10-CM | POA: Diagnosis not present

## 2022-05-03 DIAGNOSIS — Z7952 Long term (current) use of systemic steroids: Secondary | ICD-10-CM

## 2022-05-03 DIAGNOSIS — E871 Hypo-osmolality and hyponatremia: Secondary | ICD-10-CM | POA: Diagnosis present

## 2022-05-03 DIAGNOSIS — Z713 Dietary counseling and surveillance: Secondary | ICD-10-CM

## 2022-05-03 DIAGNOSIS — Z79899 Other long term (current) drug therapy: Secondary | ICD-10-CM

## 2022-05-03 DIAGNOSIS — Z7985 Long-term (current) use of injectable non-insulin antidiabetic drugs: Secondary | ICD-10-CM | POA: Diagnosis not present

## 2022-05-03 DIAGNOSIS — R059 Cough, unspecified: Secondary | ICD-10-CM | POA: Diagnosis not present

## 2022-05-03 DIAGNOSIS — I700 Atherosclerosis of aorta: Secondary | ICD-10-CM | POA: Diagnosis not present

## 2022-05-03 DIAGNOSIS — Z6835 Body mass index (BMI) 35.0-35.9, adult: Secondary | ICD-10-CM

## 2022-05-03 DIAGNOSIS — Z9981 Dependence on supplemental oxygen: Secondary | ICD-10-CM | POA: Diagnosis not present

## 2022-05-03 DIAGNOSIS — N1831 Chronic kidney disease, stage 3a: Secondary | ICD-10-CM | POA: Diagnosis not present

## 2022-05-03 DIAGNOSIS — E1165 Type 2 diabetes mellitus with hyperglycemia: Secondary | ICD-10-CM | POA: Diagnosis present

## 2022-05-03 DIAGNOSIS — E119 Type 2 diabetes mellitus without complications: Secondary | ICD-10-CM

## 2022-05-03 DIAGNOSIS — T380X5A Adverse effect of glucocorticoids and synthetic analogues, initial encounter: Secondary | ICD-10-CM | POA: Diagnosis present

## 2022-05-03 DIAGNOSIS — N281 Cyst of kidney, acquired: Secondary | ICD-10-CM | POA: Diagnosis not present

## 2022-05-03 DIAGNOSIS — R0602 Shortness of breath: Secondary | ICD-10-CM | POA: Diagnosis not present

## 2022-05-03 LAB — RENAL FUNCTION PANEL
Albumin: 1.9 g/dL — ABNORMAL LOW (ref 3.5–5.0)
Anion gap: 7 (ref 5–15)
BUN: 68 mg/dL — ABNORMAL HIGH (ref 8–23)
CO2: 17 mmol/L — ABNORMAL LOW (ref 22–32)
Calcium: 6.1 mg/dL — CL (ref 8.9–10.3)
Chloride: 107 mmol/L (ref 98–111)
Creatinine, Ser: 1.23 mg/dL — ABNORMAL HIGH (ref 0.44–1.00)
GFR, Estimated: 42 mL/min — ABNORMAL LOW (ref >60)
Glucose, Bld: 235 mg/dL — ABNORMAL HIGH (ref 70–99)
Phosphorus: 2.7 mg/dL (ref 2.5–4.6)
Potassium: 3.7 mmol/L (ref 3.5–5.1)
Sodium: 131 mmol/L — ABNORMAL LOW (ref 135–145)

## 2022-05-03 LAB — COMPREHENSIVE METABOLIC PANEL
ALT: 60 U/L — ABNORMAL HIGH (ref 0–44)
AST: 43 U/L — ABNORMAL HIGH (ref 15–41)
Albumin: 2.9 g/dL — ABNORMAL LOW (ref 3.5–5.0)
Alkaline Phosphatase: 127 U/L — ABNORMAL HIGH (ref 38–126)
Anion gap: 11 (ref 5–15)
BUN: 97 mg/dL — ABNORMAL HIGH (ref 8–23)
CO2: 25 mmol/L (ref 22–32)
Calcium: 8.8 mg/dL — ABNORMAL LOW (ref 8.9–10.3)
Chloride: 89 mmol/L — ABNORMAL LOW (ref 98–111)
Creatinine, Ser: 2.36 mg/dL — ABNORMAL HIGH (ref 0.44–1.00)
GFR, Estimated: 19 mL/min — ABNORMAL LOW (ref >60)
Glucose, Bld: 253 mg/dL — ABNORMAL HIGH (ref 70–99)
Potassium: 4.8 mmol/L (ref 3.5–5.1)
Sodium: 125 mmol/L — ABNORMAL LOW (ref 135–145)
Total Bilirubin: 0.8 mg/dL (ref 0.3–1.2)
Total Protein: 6.5 g/dL (ref 6.5–8.1)

## 2022-05-03 LAB — CBC WITH DIFFERENTIAL/PLATELET
Abs Immature Granulocytes: 0.06 10*3/uL (ref 0.00–0.07)
Basophils Absolute: 0 10*3/uL (ref 0.0–0.1)
Basophils Relative: 0 %
Eosinophils Absolute: 0.1 10*3/uL (ref 0.0–0.5)
Eosinophils Relative: 0 %
HCT: 32.4 % — ABNORMAL LOW (ref 36.0–46.0)
Hemoglobin: 10.7 g/dL — ABNORMAL LOW (ref 12.0–15.0)
Immature Granulocytes: 1 %
Lymphocytes Relative: 10 %
Lymphs Abs: 1.2 10*3/uL (ref 0.7–4.0)
MCH: 29.1 pg (ref 26.0–34.0)
MCHC: 33 g/dL (ref 30.0–36.0)
MCV: 88 fL (ref 80.0–100.0)
Monocytes Absolute: 0.9 10*3/uL (ref 0.1–1.0)
Monocytes Relative: 7 %
Neutro Abs: 9.6 10*3/uL — ABNORMAL HIGH (ref 1.7–7.7)
Neutrophils Relative %: 82 %
Platelets: DECREASED 10*3/uL (ref 150–400)
RBC: 3.68 MIL/uL — ABNORMAL LOW (ref 3.87–5.11)
RDW: 12.8 % (ref 11.5–15.5)
WBC: 11.8 10*3/uL — ABNORMAL HIGH (ref 4.0–10.5)
nRBC: 0.3 % — ABNORMAL HIGH (ref 0.0–0.2)

## 2022-05-03 LAB — TROPONIN I (HIGH SENSITIVITY)
Troponin I (High Sensitivity): 18 ng/L — ABNORMAL HIGH (ref <18)
Troponin I (High Sensitivity): 15 ng/L (ref <18)

## 2022-05-03 LAB — RESP PANEL BY RT-PCR (RSV, FLU A&B, COVID)  RVPGX2
Influenza A by PCR: NEGATIVE
Influenza B by PCR: NEGATIVE
Resp Syncytial Virus by PCR: NEGATIVE
SARS Coronavirus 2 by RT PCR: POSITIVE — AB

## 2022-05-03 LAB — BLOOD GAS, VENOUS
Acid-Base Excess: 1 mmol/L (ref 0.0–2.0)
Bicarbonate: 27.9 mmol/L (ref 20.0–28.0)
O2 Saturation: 70.9 %
Patient temperature: 36.1
pCO2, Ven: 51 mmHg (ref 44–60)
pH, Ven: 7.34 (ref 7.25–7.43)
pO2, Ven: 39 mmHg (ref 32–45)

## 2022-05-03 LAB — BRAIN NATRIURETIC PEPTIDE: B Natriuretic Peptide: 127.4 pg/mL — ABNORMAL HIGH (ref 0.0–100.0)

## 2022-05-03 LAB — GLUCOSE, CAPILLARY: Glucose-Capillary: 381 mg/dL — ABNORMAL HIGH (ref 70–99)

## 2022-05-03 LAB — CBG MONITORING, ED: Glucose-Capillary: 305 mg/dL — ABNORMAL HIGH (ref 70–99)

## 2022-05-03 MED ORDER — VITAMIN C 500 MG PO TABS
500.0000 mg | ORAL_TABLET | Freq: Every day | ORAL | Status: DC
  Administered 2022-05-03 – 2022-05-07 (×5): 500 mg via ORAL
  Filled 2022-05-03 (×5): qty 1

## 2022-05-03 MED ORDER — INSULIN ASPART 100 UNIT/ML IJ SOLN
0.0000 [IU] | Freq: Every day | INTRAMUSCULAR | Status: DC
  Administered 2022-05-03: 5 [IU] via SUBCUTANEOUS
  Administered 2022-05-04: 2 [IU] via SUBCUTANEOUS
  Administered 2022-05-05 – 2022-05-06 (×2): 5 [IU] via SUBCUTANEOUS
  Filled 2022-05-03: qty 0.05

## 2022-05-03 MED ORDER — METHYLPREDNISOLONE SODIUM SUCC 125 MG IJ SOLR
0.5000 mg/kg | Freq: Two times a day (BID) | INTRAMUSCULAR | Status: AC
  Administered 2022-05-04 – 2022-05-06 (×5): 46.25 mg via INTRAVENOUS
  Filled 2022-05-03 (×5): qty 2

## 2022-05-03 MED ORDER — IPRATROPIUM-ALBUTEROL 20-100 MCG/ACT IN AERS
1.0000 | INHALATION_SPRAY | Freq: Four times a day (QID) | RESPIRATORY_TRACT | Status: DC
  Administered 2022-05-04: 1 via RESPIRATORY_TRACT
  Filled 2022-05-03 (×2): qty 4

## 2022-05-03 MED ORDER — METHYLPREDNISOLONE SODIUM SUCC 125 MG IJ SOLR
0.5000 mg/kg | Freq: Two times a day (BID) | INTRAMUSCULAR | Status: DC
  Administered 2022-05-03: 46.25 mg via INTRAVENOUS
  Filled 2022-05-03: qty 2

## 2022-05-03 MED ORDER — ACETAMINOPHEN 325 MG PO TABS: 650.0000 mg | ORAL_TABLET | Freq: Four times a day (QID) | ORAL | Status: DC | PRN

## 2022-05-03 MED ORDER — PREDNISONE 50 MG PO TABS
50.0000 mg | ORAL_TABLET | Freq: Every day | ORAL | Status: DC
  Administered 2022-05-07: 50 mg via ORAL
  Filled 2022-05-03: qty 1

## 2022-05-03 MED ORDER — SODIUM CHLORIDE 0.9 % IV BOLUS
500.0000 mL | Freq: Once | INTRAVENOUS | Status: AC
  Administered 2022-05-03: 500 mL via INTRAVENOUS

## 2022-05-03 MED ORDER — DEXAMETHASONE SODIUM PHOSPHATE 10 MG/ML IJ SOLN
10.0000 mg | Freq: Once | INTRAMUSCULAR | Status: AC
  Administered 2022-05-03: 10 mg via INTRAVENOUS
  Filled 2022-05-03: qty 1

## 2022-05-03 MED ORDER — IPRATROPIUM-ALBUTEROL 0.5-2.5 (3) MG/3ML IN SOLN
3.0000 mL | Freq: Once | RESPIRATORY_TRACT | Status: AC
  Administered 2022-05-03: 3 mL via RESPIRATORY_TRACT
  Filled 2022-05-03: qty 3

## 2022-05-03 MED ORDER — HYDROCOD POLI-CHLORPHE POLI ER 10-8 MG/5ML PO SUER: 5.0000 mL | Freq: Two times a day (BID) | ORAL | Status: DC | PRN

## 2022-05-03 MED ORDER — PREDNISONE 50 MG PO TABS: 50.0000 mg | ORAL_TABLET | Freq: Every day | ORAL | Status: DC

## 2022-05-03 MED ORDER — METHYLPREDNISOLONE SODIUM SUCC 125 MG IJ SOLR: 0.5000 mg/kg | Freq: Two times a day (BID) | INTRAMUSCULAR | Status: DC

## 2022-05-03 MED ORDER — GUAIFENESIN-DM 100-10 MG/5ML PO SYRP
10.0000 mL | ORAL_SOLUTION | ORAL | Status: DC | PRN
  Administered 2022-05-03 – 2022-05-05 (×5): 10 mL via ORAL
  Filled 2022-05-03 (×5): qty 10

## 2022-05-03 MED ORDER — SODIUM CHLORIDE 0.9 % IV SOLN: INTRAVENOUS | Status: DC

## 2022-05-03 MED ORDER — ZINC SULFATE 220 (50 ZN) MG PO CAPS
220.0000 mg | ORAL_CAPSULE | Freq: Every day | ORAL | Status: DC
  Administered 2022-05-03 – 2022-05-07 (×5): 220 mg via ORAL
  Filled 2022-05-03 (×5): qty 1

## 2022-05-03 MED ORDER — ACETAMINOPHEN 650 MG RE SUPP: 650.0000 mg | Freq: Four times a day (QID) | RECTAL | Status: DC | PRN

## 2022-05-03 MED ORDER — INSULIN ASPART 100 UNIT/ML IJ SOLN
0.0000 [IU] | Freq: Three times a day (TID) | INTRAMUSCULAR | Status: DC
  Administered 2022-05-03: 11 [IU] via SUBCUTANEOUS
  Administered 2022-05-04: 15 [IU] via SUBCUTANEOUS
  Administered 2022-05-04 (×2): 11 [IU] via SUBCUTANEOUS
  Administered 2022-05-05 (×2): 8 [IU] via SUBCUTANEOUS
  Administered 2022-05-05 – 2022-05-06 (×4): 15 [IU] via SUBCUTANEOUS
  Administered 2022-05-07: 11 [IU] via SUBCUTANEOUS
  Administered 2022-05-07: 15 [IU] via SUBCUTANEOUS
  Filled 2022-05-03: qty 0.15

## 2022-05-03 NOTE — ED Provider Notes (Signed)
[facility]-EMERGENCY DEPT Provider Note   CSN: [id number] Arrival date & time: 05/03/22  0857     History  Chief Complaint  Patient presents with   Shortness of Breath    [name] is a 89 y.o. female.  With past medical history of congestive heart failure, COPD, diabetes, hyperlipidemia, hypertension, CKD stage III who presents to the emergency department with shortness of breath.   States last night she began to feel short of breath and have chest pain.  States that she was diagnosed with COVID about 1 week ago.  She was having symptoms on Tuesday of cough and viral symptoms but this had started to improve.  She describes having the central chest pressure, "like something is just sitting there and will move."  She states it is nonradiating.  She denies having palpitations or nausea or lightheadedness.  She does endorse ongoing cough that is productive.  She sleeps reclined but denies having worsening PND orthopnea.  Denies having worsening lower extremity edema.  She denies having fevers at home.  Uses 4 L nasal cannula at baseline for her COPD which has not increased.   Shortness of Breath Associated symptoms: chest pain and cough        Home Medications Prior to Admission medications   Medication Sig Start Date End Date Taking? Authorizing Provider  acetaminophen (TYLENOL) 650 MG CR tablet Take 650 mg by mouth every 8 (eight) hours as needed for pain.    [provider]  amLODipine (NORVASC) 10 MG tablet TAKE 1 TABLET(10 MG) BY MOUTH DAILY 10/21/21   Yates Decamp, MD  Brinzolamide-Brimonidine [facility]) 1-0.2 % SUSP Apply 1 drop to eye in the morning and at bedtime.     [provider]  COVID-19 mRNA vaccine, Moderna, 100 MCG/0.5ML injection  09/15/20   [provider]  guaiFENesin-dextromethorphan (ROBITUSSIN DM) 100-10 MG/5ML syrup Take 5 mLs by mouth every 4 (four) hours as needed for cough. 12/09/19   Lorin Glass, MD   hydrochlorothiazide (HYDRODIURIL) 25 MG tablet Take 1 tablet (25 mg total) by mouth daily. 10/21/21   Yates Decamp, MD  ipratropium-albuterol (DUONEB) 0.5-2.5 (3) MG/3ML SOLN Take 3 mLs by nebulization every 4 (four) hours as needed. 12/09/19 07/07/21  Lorin Glass, MD  isosorbide mononitrate (IMDUR) 60 MG 24 hr tablet TAKE 1 TABLET DAILY 09/15/21   Yates Decamp, MD  liraglutide (VICTOZA) 18 MG/3ML SOPN Inject 0.6 mg into the skin daily.     [provider]  metFORMIN (GLUCOPHAGE) 1000 MG tablet Take 0.5 tablets (500 mg total) by mouth daily. 10/27/16   Orpah Cobb, MD  predniSONE (DELTASONE) 20 MG tablet Take 2 tablets (40 mg total) by mouth daily. 11/28/20   White, Adrienne R, NP  ROCKLATAN 0.02-0.005 % SOLN Place 1 drop into both eyes at bedtime. 11/24/19   [provider]  telmisartan (MICARDIS) 80 MG tablet  07/29/20   [provider]  triamcinolone ointment (KENALOG) 0.1 %  08/16/20   [provider]      Allergies    Cephalexin, Micardis [telmisartan], Atorvastatin, Buspirone, Canagliflozin, Doxycycline hyclate, Lipitor [atorvastatin calcium], Tramadol, and Tramadol hcl    Review of Systems   Review of Systems  Constitutional:  Positive for fatigue.  Respiratory:  Positive for cough and shortness of breath.   Cardiovascular:  Positive for chest pain. Negative for palpitations and leg swelling.  All other systems reviewed and are negative.   Physical Exam Updated Vital Signs BP (!) 103/55  Pulse 85   Temp (!) 97.5 F (36.4 C) (Oral)   Resp 16   Ht 5\' 4"  (1.626 m)   Wt 93 kg   SpO2 94%   BMI 35.19 kg/m  Physical Exam Vitals and nursing note reviewed.  Constitutional:      General: She is not in acute distress.    Appearance: Normal appearance. She is obese. She is ill-appearing. She is not toxic-appearing.  HENT:     Head: Normocephalic.     Mouth/Throat:     Pharynx: Oropharynx is clear.  Eyes:     General: No scleral icterus.     Extraocular Movements: Extraocular movements intact.     Pupils: Pupils are equal, round, and reactive to light.  Neck:     Vascular: No JVD.  Cardiovascular:     Rate and Rhythm: Normal rate and regular rhythm.     Pulses: Normal pulses.     Heart sounds: No murmur heard. Pulmonary:     Effort: Tachypnea present. No accessory muscle usage or respiratory distress.     Breath sounds: Examination of the right-lower field reveals rales. Examination of the left-lower field reveals rales. Decreased breath sounds and rales present. No wheezing.  Chest:     Chest wall: No tenderness.  Abdominal:     General: Bowel sounds are normal.     Palpations: Abdomen is soft.  Musculoskeletal:     Cervical back: Normal range of motion.     Right lower leg: No edema.     Left lower leg: No edema.  Skin:    General: Skin is warm and dry.     Capillary Refill: Capillary refill takes less than 2 seconds.  Neurological:     General: No focal deficit present.     Mental Status: She is alert and oriented to person, place, and time.  Psychiatric:        Mood and Affect: Mood normal.        Behavior: Behavior normal.     ED Results / Procedures / Treatments   Labs (all labs ordered are listed, but only abnormal results are displayed) Labs Reviewed  RESP PANEL BY RT-PCR (RSV, FLU A&B, COVID)  RVPGX2 - Abnormal; Notable for the following components:      Result Value   SARS Coronavirus 2 by RT PCR POSITIVE (*)    All other components within normal limits  COMPREHENSIVE METABOLIC PANEL - Abnormal; Notable for the following components:   Sodium 125 (*)    Chloride 89 (*)    Glucose, Bld 253 (*)    BUN 97 (*)    Creatinine, Ser 2.36 (*)    Calcium 8.8 (*)    Albumin 2.9 (*)    AST 43 (*)    ALT 60 (*)    Alkaline Phosphatase 127 (*)    GFR, Estimated 19 (*)    All other components within normal limits  BRAIN NATRIURETIC PEPTIDE - Abnormal; Notable for the following components:   B Natriuretic  Peptide 127.4 (*)    All other components within normal limits  CBC WITH DIFFERENTIAL/PLATELET - Abnormal; Notable for the following components:   WBC 11.8 (*)    RBC 3.68 (*)    Hemoglobin 10.7 (*)    HCT 32.4 (*)    nRBC 0.3 (*)    Neutro Abs 9.6 (*)    All other components within normal limits  TROPONIN I (HIGH SENSITIVITY) - Abnormal; Notable for the following components:  Troponin I (High Sensitivity) 18 (*)    All other components within normal limits  BLOOD GAS, VENOUS  CBC WITH DIFFERENTIAL/PLATELET  TROPONIN I (HIGH SENSITIVITY)    EKG EKG Interpretation  Date/Time:  Sunday May 03 2022 09:13:34 EST Ventricular Rate:  87 PR Interval:  253 QRS Duration: 87 QT Interval:  363 QTC Calculation: 437 R Axis:   -47 Text Interpretation: Sinus rhythm Atrial premature complex Prolonged PR interval Left anterior fascicular block Left ventricular hypertrophy Anterior Q waves, possibly due to LVH ST elevation, consider inferior injury No significant change since last tracing Confirmed by Alvira Monday ([id number]) on 05/03/2022 10:38:21 AM  Radiology DG Chest Port 1 View  Result Date: 05/03/2022 CLINICAL DATA:  Shortness of breath with productive cough for 1 week. Recently diagnosed with COVID. EXAM: PORTABLE CHEST 1 VIEW COMPARISON:  Radiographs 06/16/2021 and 12/08/2020.  CT 12/06/2019. FINDINGS: 0953 hours. The heart size and mediastinal contours are stable with aortic atherosclerosis. There is stable mild atelectasis or scarring at the left lung base. No new airspace disease, edema, significant pleural effusion or pneumothorax. The bones appear unchanged with mild thoracic spondylosis and postsurgical changes at the right shoulder. No acute osseous findings are demonstrated. Telemetry leads overlie the chest. IMPRESSION: No evidence of acute cardiopulmonary process. Stable mild left basilar atelectasis or scarring. Electronically Signed   By: Carey Bullocks M.D.   On: 05/03/2022 10:37     Procedures Procedures    Medications Ordered in ED Medications  dexamethasone (DECADRON) injection 10 mg (has no administration in time range)  ipratropium-albuterol (DUONEB) 0.5-2.5 (3) MG/3ML nebulizer solution 3 mL (3 mLs Nebulization Given 05/03/22 1017)  sodium chloride 0.9 % bolus 500 mL (500 mLs Intravenous New Bag/Given 05/03/22 1105)    ED Course/ Medical Decision Making/ A&P Clinical Course as of 05/03/22 1230  Sun May 03, 2022  1156 New AKI, hyponatremia. Elevated LFTs. She does have bilateral crackles but BNP only 127. Suspicious she may be volume down with decreased PO intake. Will given her 500 IVF and reassess. Initial trop 18, delta pending. Will need admission.  [LA]  1204 Consulted with Dr. Ronaldo Miyamoto, hospitalist who agrees to admit patient.  [LA]    Clinical Course User Index [LA] Cristopher Peru, PA-C                           Medical Decision Making Amount and/or Complexity of Data Reviewed Labs: ordered. Radiology: ordered.  Risk Prescription drug management. Decision regarding hospitalization.  Initial Impression and Ddx 89 year old female who presents to the emergency department with chest pain and shortness of breath. Recently diagnosed with COVID-19 on Tuesday. She is on her home 4LNC. She appears objectively tachypneic. Crackles in the bases. Will obtain ACS work up along with BNP, venous gas Patient PMH that increases complexity of ED encounter:  COPD, CHF, diabetes, HTN  Interpretation of Diagnostics I independent reviewed and interpreted the labs as followed: troponin 18, delta pending; BNP 120s, COVID+, CBC with only mild leukocytosis 11.8, venous gas without retention Na 125 - new. AKI with creatinine of 2.36. transaminitis which is new   - I independently visualized the following imaging with scope of interpretation limited to determining acute life threatening conditions related to emergency care: CXR, which revealed stable findings   Patient  Reassessment and Ultimate Disposition/Management 89 year old female who presents in the setting of COVID-19 with worsening shortness of breath and chest pain this morning.  EKG without acute  ischemia or infarction. Initial troponin is 18. Pending delta.  CXR without pulmonary edema, PTX, pneumonia, effusion. She does have crackles in the bilateral bases which may be chronic from COPD or related to her CHF. She has stable lower extremities and is not hypoxic. Do not feel she is overtly fluid overloaded. BNP is only 127.  Symptoms inconsistent with tamponade, dissection. Considered but doubt PE at this time. Labs with new hyponatremia, AKI and transaminitis. She has had decreased PO intake x1 week which may be reflected dehydration. I gave her IVF and can reassess on admission. The transaminitis would lend more to an overload picture, which again, do not feel she is that overloaded at this time. Will need trending.   Mixed picture. Acute COVID-19 this week with likely now COPD exacerbation. She was given duoneb and steroids here. New AKI with hyponatremia likely dehydration related and receiving IVF. She does have an initial trop of 18 and pending delta for ACS work up.  I discussed this case with my attending physician who cosigned this note including patient's presenting symptoms, physical exam, and planned diagnostics and interventions. Attending physician stated agreement with plan or made changes to plan which were implemented.   Attending physician assessed patient at bedside.  Patient is agreeable to admission at this time.  Consulted and spoke with Dr. Ronaldo Miyamoto who agrees to admit the patient for ongoing work up.  Patient management required discussion with the following services or consulting groups:  Hospitalist Service  Complexity of Problems Addressed Acute complicated illness or Injury  Additional Data Reviewed and Analyzed Further history obtained from: Further history from  spouse/family member, Past medical history and medications listed in the EMR, Recent PCP notes, Care Everywhere, and Prior labs/imaging results  Patient Encounter Risk Assessment SDOH impact on management and Consideration of hospitalization  Final Clinical Impression(s) / ED Diagnoses Final diagnoses:  Acute kidney injury (HCC)  COPD exacerbation [facility])    Rx / DC Orders ED Discharge Orders     None         Cristopher Peru, PA-C 05/03/22 1230    Alvira Monday, MD 05/04/22 1436

## 2022-05-03 NOTE — ED Notes (Signed)
Called 6E to check on bed, floor doesn't take covid + patients. Floor has let AC know

## 2022-05-03 NOTE — ED Triage Notes (Signed)
BIBA from home with sob x 1 week with productive cough Pt diagnosed with covid 1 week ago 4lpm [place] baseline Hx copd and CHF.

## 2022-05-03 NOTE — H&P (Signed)
History and Physical    Patient: [name] DOB: Sep 01, 1930 DOA: 05/03/2022 DOS: the patient was seen and examined on 05/03/2022 PCP: [name], DO  Patient coming from: Home  Chief Complaint:  Chief Complaint  Patient presents with   Shortness of Breath   HPI: [name] is a 89 y.o. female with medical history significant of CHF, COPD, DM2, CKD 3b, glaucoma, HTN, HLD.  Presenting with cough and shortness of breath. Her symptoms started about a week ago. She noticed that she was having cough. She tried OTC meds, but they didn't help. 5 days ago she tested at home for COVID and was positive. She reports that her symptoms continued to progress. She has central chest pressure and congestion. She had productive cough. She didn't have any palpitations, fevers, orthopnea. She states her dyspnea really worsened this morning and that is why she came to the ED for assistance. She denies any other aggravating or alleviating factors.   Review of Systems: As mentioned in the history of present illness. All other systems reviewed and are negative. Past Medical History:  Diagnosis Date   Cellulitis and abscess of left leg    CHF (congestive heart failure) (HCC)    COPD (chronic obstructive pulmonary disease) ([place] )    Diabetes mellitus    Glaucoma    Hyperlipidemia    Hypertension    Past Surgical History:  Procedure Laterality Date   BACK SURGERY     CARDIAC CATHETERIZATION N/A 12/09/2015   Procedure: Right Heart Cath;  Surgeon: Adrian Prows, MD;  Location: [place];  Service: Cardiovascular;  Laterality: N/A;   CHOLECYSTECTOMY  2000   HERNIA REPAIR  2007   rupture disk  1970"s   SHOULDER ARTHROSCOPY WITH ROTATOR CUFF REPAIR  1999   rt shoulder   Social History:  reports that she has never smoked. She has never used smokeless tobacco. She reports that she does not drink alcohol and does not use drugs.  Allergies  Allergen Reactions   Cephalexin Anaphylaxis,  Swelling and Other (See Comments)   Micardis [Telmisartan] Other (See Comments)    Hyperkalemia during hospitalization with pulmonary edema   Atorvastatin Other (See Comments)    Other reaction(s): myalgia   Buspirone Other (See Comments)    Other reaction(s): "couldn't breath"   Canagliflozin Other (See Comments)    Pt does not recall   Doxycycline Hyclate Other (See Comments)    Other reaction(s): nausea   Lipitor [Atorvastatin Calcium] Other (See Comments)    myalgia   Tramadol Nausea Only   Tramadol Hcl Other (See Comments)    Family History  Problem Relation Age of Onset   Stroke Mother     Prior to Admission medications   Medication Sig Start Date End Date Taking? Authorizing Provider  acetaminophen (TYLENOL) 650 MG CR tablet Take 650 mg by mouth every 8 (eight) hours as needed for pain.    [provider]  amLODipine (NORVASC) 10 MG tablet TAKE 1 TABLET(10 MG) BY MOUTH DAILY 10/21/21   Adrian Prows, MD  Brinzolamide-Brimonidine [facility]) 1-0.2 % SUSP Apply 1 drop to eye in the morning and at bedtime.     [provider]  COVID-19 mRNA vaccine, Moderna, 100 MCG/0.5ML injection  09/15/20   [provider]  guaiFENesin-dextromethorphan (ROBITUSSIN DM) 100-10 MG/5ML syrup Take 5 mLs by mouth every 4 (four) hours as needed for cough. 12/09/19   Terrilee Croak, MD  hydrochlorothiazide (HYDRODIURIL) 25 MG tablet Take 1 tablet (  25 mg total) by mouth daily. 10/21/21   Adrian Prows, MD  ipratropium-albuterol (DUONEB) 0.5-2.5 (3) MG/3ML SOLN Take 3 mLs by nebulization every 4 (four) hours as needed. 12/09/19 07/07/21  Terrilee Croak, MD  isosorbide mononitrate (IMDUR) 60 MG 24 hr tablet TAKE 1 TABLET DAILY 09/15/21   Adrian Prows, MD  liraglutide (VICTOZA) 18 MG/3ML SOPN Inject 0.6 mg into the skin daily.     [provider]  metFORMIN (GLUCOPHAGE) 1000 MG tablet Take 0.5 tablets (500 mg total) by mouth daily. 10/27/16   [name], MD  predniSONE (DELTASONE) 20  MG tablet Take 2 tablets (40 mg total) by mouth daily. 11/28/20   White, Adrienne R, NP  ROCKLATAN 0.02-0.005 % SOLN Place 1 drop into both eyes at bedtime. 11/24/19   [provider]  telmisartan (MICARDIS) 80 MG tablet  07/29/20   [provider]  triamcinolone ointment (KENALOG) 0.1 %  08/16/20   [provider]    Physical Exam: Vitals:   05/03/22 1100 05/03/22 1115 05/03/22 1145 05/03/22 1200  BP: (!) 171/97 (!) 104/54 (!) 109/56 (!) 103/55  Pulse: 79 80 83 85  Resp: '14 14 15 16  '$ Temp:      TempSrc:      SpO2: 95% 95% 92% 94%  Weight:      Height:       General: 89 y.o. female resting in bed in NAD Eyes: PERRL, normal sclera ENMT: Nares patent w/o discharge, orophaynx clear, dentition normal, ears w/o discharge/lesions/ulcers Neck: Supple, trachea midline Cardiovascular: RRR, +S1, S2, no m/g/r, equal pulses throughout Respiratory: soft expiratory wheeze, decreased at bases, no r/r, normal WOB GI: BS+, NDNT, no masses noted, no organomegaly noted MSK: No e/c/c Neuro: A&O x 3, no focal deficits Psyc: Appropriate interaction and affect, calm/cooperative  Data Reviewed:  Results for orders placed or performed during the hospital encounter of 05/03/22 (from the past 24 hour(s))  Comprehensive metabolic panel     Status: Abnormal   Collection Time: 05/03/22 10:00 AM  Result Value Ref Range   Sodium 125 (L) 135 - 145 mmol/L   Potassium 4.8 3.5 - 5.1 mmol/L   Chloride 89 (L) 98 - 111 mmol/L   CO2 25 22 - 32 mmol/L   Glucose, Bld 253 (H) 70 - 99 mg/dL   BUN 97 (H) 8 - 23 mg/dL   Creatinine, Ser 2.36 (H) 0.44 - 1.00 mg/dL   Calcium 8.8 (L) 8.9 - 10.3 mg/dL   Total Protein 6.5 6.5 - 8.1 g/dL   Albumin 2.9 (L) 3.5 - 5.0 g/dL   AST 43 (H) 15 - 41 U/L   ALT 60 (H) 0 - 44 U/L   Alkaline Phosphatase 127 (H) 38 - 126 U/L   Total Bilirubin 0.8 0.3 - 1.2 mg/dL   GFR, Estimated 19 (L) >60 mL/min   Anion gap 11 5 - 15  Troponin I (High Sensitivity)     Status:  Abnormal   Collection Time: 05/03/22 10:00 AM  Result Value Ref Range   Troponin I (High Sensitivity) 18 (H) <18 ng/L  Brain natriuretic peptide     Status: Abnormal   Collection Time: 05/03/22 10:00 AM  Result Value Ref Range   B Natriuretic Peptide 127.4 (H) 0.0 - 100.0 pg/mL  Resp panel by RT-PCR (RSV, Flu A&B, Covid) Anterior Nasal Swab     Status: Abnormal   Collection Time: 05/03/22 10:09 AM   Specimen: Anterior Nasal Swab  Result Value Ref Range   SARS  Coronavirus 2 by RT PCR POSITIVE (A) NEGATIVE   Influenza A by PCR NEGATIVE NEGATIVE   Influenza B by PCR NEGATIVE NEGATIVE   Resp Syncytial Virus by PCR NEGATIVE NEGATIVE  Blood gas, venous     Status: None   Collection Time: 05/03/22 10:15 AM  Result Value Ref Range   pH, Ven 7.34 7.25 - 7.43   pCO2, Ven 51 44 - 60 mmHg   pO2, Ven 39 32 - 45 mmHg   Bicarbonate 27.9 20.0 - 28.0 mmol/L   Acid-Base Excess 1.0 0.0 - 2.0 mmol/L   O2 Saturation 70.9 %   Patient temperature 36.1   CBC with Differential/Platelet     Status: Abnormal   Collection Time: 05/03/22 10:44 AM  Result Value Ref Range   WBC 11.8 (H) 4.0 - 10.5 K/uL   RBC 3.68 (L) 3.87 - 5.11 MIL/uL   Hemoglobin 10.7 (L) 12.0 - 15.0 g/dL   HCT 32.4 (L) 36.0 - 46.0 %   MCV 88.0 80.0 - 100.0 fL   MCH 29.1 26.0 - 34.0 pg   MCHC 33.0 30.0 - 36.0 g/dL   RDW 12.8 11.5 - 15.5 %   Platelets  150 - 400 K/uL    PLATELET CLUMPS NOTED ON SMEAR, COUNT APPEARS DECREASED   nRBC 0.3 (H) 0.0 - 0.2 %   Neutrophils Relative % 82 %   Neutro Abs 9.6 (H) 1.7 - 7.7 K/uL   Lymphocytes Relative 10 %   Lymphs Abs 1.2 0.7 - 4.0 K/uL   Monocytes Relative 7 %   Monocytes Absolute 0.9 0.1 - 1.0 K/uL   Eosinophils Relative 0 %   Eosinophils Absolute 0.1 0.0 - 0.5 K/uL   Basophils Relative 0 %   Basophils Absolute 0.0 0.0 - 0.1 K/uL   Immature Granulocytes 1 %   Abs Immature Granulocytes 0.06 0.00 - 0.07 K/uL   CXR: No evidence of acute cardiopulmonary process. Stable mild left basilar  atelectasis or scarring  EKG: sinus, no st elevation  Assessment and Plan: AKI on CKD 3b     - admit to inpt, tele     - check renal US; UA     - watch nephrotoxins     - fluids  Hyponatremia     - check UA, UNa+, Uosm     - fluids     - renal function panel q6h; limit Na+ rise to 8-10pts/day  COVID 19 COPD Chronic hypoxic respiratory failure on 4L     - symptoms started about a week ago     - continue isolation for next 3 days     - supportive care w/ inhalers, steroids, guaifenesin     - trend inflammatory markers  DM2     - A1c, glucose checks, DM diet, SSI  HTN Chronic diastolic HF     - hold ARB/diuretics d/t AK  Elevated LFTs     - check RUQ [place] Ab     - check hepatitis panel  Advance Care Planning:   Code Status: FULL  Consults: None  Family Communication: w/ husband at bedside  Severity of Illness: The appropriate patient status for this patient is INPATIENT. Inpatient status is judged to be reasonable and necessary in order to provide the required intensity of service to ensure the patient's safety. The patient's presenting symptoms, physical exam findings, and initial radiographic and laboratory data in the context of their chronic comorbidities is felt to place them at high risk for further clinical deterioration. Furthermore,  it is not anticipated that the patient will be medically stable for discharge from the hospital within 2 midnights of admission.   * I certify that at the point of admission it is my clinical judgment that the patient will require inpatient hospital care spanning beyond 2 midnights from the point of admission due to high intensity of service, high risk for further deterioration and high frequency of surveillance required.*  Author: [name], DO 05/03/2022 12:10 PM  For on call review [email or web address].

## 2022-05-04 ENCOUNTER — Inpatient Hospital Stay (HOSPITAL_COMMUNITY): Payer: Medicare Other

## 2022-05-04 DIAGNOSIS — R7989 Other specified abnormal findings of blood chemistry: Secondary | ICD-10-CM | POA: Diagnosis not present

## 2022-05-04 DIAGNOSIS — I5032 Chronic diastolic (congestive) heart failure: Secondary | ICD-10-CM | POA: Diagnosis not present

## 2022-05-04 DIAGNOSIS — J441 Chronic obstructive pulmonary disease with (acute) exacerbation: Secondary | ICD-10-CM | POA: Diagnosis not present

## 2022-05-04 DIAGNOSIS — I10 Essential (primary) hypertension: Secondary | ICD-10-CM

## 2022-05-04 DIAGNOSIS — J9611 Chronic respiratory failure with hypoxia: Secondary | ICD-10-CM

## 2022-05-04 DIAGNOSIS — U071 COVID-19: Secondary | ICD-10-CM

## 2022-05-04 DIAGNOSIS — N183 Chronic kidney disease, stage 3 unspecified: Secondary | ICD-10-CM

## 2022-05-04 DIAGNOSIS — E871 Hypo-osmolality and hyponatremia: Secondary | ICD-10-CM

## 2022-05-04 DIAGNOSIS — E1122 Type 2 diabetes mellitus with diabetic chronic kidney disease: Secondary | ICD-10-CM

## 2022-05-04 DIAGNOSIS — N179 Acute kidney failure, unspecified: Secondary | ICD-10-CM | POA: Diagnosis not present

## 2022-05-04 DIAGNOSIS — N1831 Chronic kidney disease, stage 3a: Secondary | ICD-10-CM

## 2022-05-04 LAB — RESPIRATORY PANEL BY PCR

## 2022-05-04 LAB — CBC WITH DIFFERENTIAL/PLATELET
Abs Immature Granulocytes: 0.07 10*3/uL (ref 0.00–0.07)
Basophils Absolute: 0 10*3/uL (ref 0.0–0.1)
Basophils Relative: 0 %
Eosinophils Absolute: 0 10*3/uL (ref 0.0–0.5)
Eosinophils Relative: 0 %
HCT: 34.6 % — ABNORMAL LOW (ref 36.0–46.0)
Hemoglobin: 11.5 g/dL — ABNORMAL LOW (ref 12.0–15.0)
Immature Granulocytes: 1 %
Lymphocytes Relative: 7 %
Lymphs Abs: 0.4 10*3/uL — ABNORMAL LOW (ref 0.7–4.0)
MCH: 29.1 pg (ref 26.0–34.0)
MCHC: 33.2 g/dL (ref 30.0–36.0)
MCV: 87.6 fL (ref 80.0–100.0)
Monocytes Absolute: 0.1 10*3/uL (ref 0.1–1.0)
Monocytes Relative: 1 %
Neutro Abs: 5 10*3/uL (ref 1.7–7.7)
Neutrophils Relative %: 91 %
Platelets: 138 10*3/uL — ABNORMAL LOW (ref 150–400)
RBC: 3.95 MIL/uL (ref 3.87–5.11)
RDW: 12.6 % (ref 11.5–15.5)
WBC: 5.5 10*3/uL (ref 4.0–10.5)
nRBC: 0 % (ref 0.0–0.2)

## 2022-05-04 LAB — COMPREHENSIVE METABOLIC PANEL
ALT: 48 U/L — ABNORMAL HIGH (ref 0–44)
AST: 26 U/L (ref 15–41)
Albumin: 3.1 g/dL — ABNORMAL LOW (ref 3.5–5.0)
Alkaline Phosphatase: 121 U/L (ref 38–126)
Anion gap: 9 (ref 5–15)
BUN: 74 mg/dL — ABNORMAL HIGH (ref 8–23)
CO2: 25 mmol/L (ref 22–32)
Calcium: 8.9 mg/dL (ref 8.9–10.3)
Chloride: 93 mmol/L — ABNORMAL LOW (ref 98–111)
Creatinine, Ser: 1.36 mg/dL — ABNORMAL HIGH (ref 0.44–1.00)
GFR, Estimated: 37 mL/min — ABNORMAL LOW (ref >60)
Glucose, Bld: 415 mg/dL — ABNORMAL HIGH (ref 70–99)
Potassium: 4.8 mmol/L (ref 3.5–5.1)
Sodium: 127 mmol/L — ABNORMAL LOW (ref 135–145)
Total Bilirubin: 0.8 mg/dL (ref 0.3–1.2)
Total Protein: 6.7 g/dL (ref 6.5–8.1)

## 2022-05-04 LAB — RENAL FUNCTION PANEL
Albumin: 2.6 g/dL — ABNORMAL LOW (ref 3.5–5.0)
Albumin: 2.8 g/dL — ABNORMAL LOW (ref 3.5–5.0)
Anion gap: 9 (ref 5–15)
Anion gap: 9 (ref 5–15)
BUN: 76 mg/dL — ABNORMAL HIGH (ref 8–23)
BUN: 86 mg/dL — ABNORMAL HIGH (ref 8–23)
CO2: 24 mmol/L (ref 22–32)
CO2: 26 mmol/L (ref 22–32)
Calcium: 8.8 mg/dL — ABNORMAL LOW (ref 8.9–10.3)
Calcium: 9.3 mg/dL (ref 8.9–10.3)
Chloride: 93 mmol/L — ABNORMAL LOW (ref 98–111)
Chloride: 93 mmol/L — ABNORMAL LOW (ref 98–111)
Creatinine, Ser: 1.47 mg/dL — ABNORMAL HIGH (ref 0.44–1.00)
Creatinine, Ser: 1.75 mg/dL — ABNORMAL HIGH (ref 0.44–1.00)
GFR, Estimated: 28 mL/min — ABNORMAL LOW (ref >60)
GFR, Estimated: 34 mL/min — ABNORMAL LOW (ref >60)
Glucose, Bld: 377 mg/dL — ABNORMAL HIGH (ref 70–99)
Glucose, Bld: 388 mg/dL — ABNORMAL HIGH (ref 70–99)
Phosphorus: 3.6 mg/dL (ref 2.5–4.6)
Phosphorus: 4.2 mg/dL (ref 2.5–4.6)
Potassium: 5.2 mmol/L — ABNORMAL HIGH (ref 3.5–5.1)
Potassium: 5.6 mmol/L — ABNORMAL HIGH (ref 3.5–5.1)
Sodium: 126 mmol/L — ABNORMAL LOW (ref 135–145)
Sodium: 128 mmol/L — ABNORMAL LOW (ref 135–145)

## 2022-05-04 LAB — GLUCOSE, RANDOM: Glucose, Bld: 405 mg/dL — ABNORMAL HIGH (ref 70–99)

## 2022-05-04 LAB — PROCALCITONIN: Procalcitonin: 0.49 ng/mL

## 2022-05-04 LAB — GLUCOSE, CAPILLARY
Glucose-Capillary: 342 mg/dL — ABNORMAL HIGH (ref 70–99)
Glucose-Capillary: 443 mg/dL — ABNORMAL HIGH (ref 70–99)
Glucose-Capillary: 317 mg/dL — ABNORMAL HIGH (ref 70–99)
Glucose-Capillary: 206 mg/dL — ABNORMAL HIGH (ref 70–99)

## 2022-05-04 LAB — PHOSPHORUS: Phosphorus: 4 mg/dL (ref 2.5–4.6)

## 2022-05-04 LAB — HEMOGLOBIN A1C
Hgb A1c MFr Bld: 10.3 % — ABNORMAL HIGH (ref 4.8–5.6)
Mean Plasma Glucose: 249 mg/dL

## 2022-05-04 LAB — C-REACTIVE PROTEIN: CRP: 7.5 mg/dL — ABNORMAL HIGH (ref <1.0)

## 2022-05-04 LAB — D-DIMER, QUANTITATIVE: D-Dimer, Quant: 3.49 ug/mL-FEU — ABNORMAL HIGH (ref 0.00–0.50)

## 2022-05-04 LAB — MAGNESIUM: Magnesium: 1.9 mg/dL (ref 1.7–2.4)

## 2022-05-04 MED ORDER — INSULIN GLARGINE-YFGN 100 UNIT/ML ~~LOC~~ SOLN
15.0000 [IU] | Freq: Every day | SUBCUTANEOUS | Status: DC
  Filled 2022-05-04: qty 0.15

## 2022-05-04 MED ORDER — ENOXAPARIN SODIUM 100 MG/ML IJ SOSY
90.0000 mg | PREFILLED_SYRINGE | Freq: Two times a day (BID) | INTRAMUSCULAR | Status: AC
  Administered 2022-05-04 – 2022-05-05 (×2): 90 mg via SUBCUTANEOUS
  Filled 2022-05-04 (×2): qty 1

## 2022-05-04 MED ORDER — HYDROCOD POLI-CHLORPHE POLI ER 10-8 MG/5ML PO SUER: 5.0000 mL | Freq: Two times a day (BID) | ORAL | Status: DC | PRN

## 2022-05-04 MED ORDER — INSULIN GLARGINE-YFGN 100 UNIT/ML ~~LOC~~ SOLN
10.0000 [IU] | Freq: Every day | SUBCUTANEOUS | Status: DC
  Administered 2022-05-04: 10 [IU] via SUBCUTANEOUS
  Filled 2022-05-04: qty 0.1

## 2022-05-04 MED ORDER — ZINC SULFATE 220 (50 ZN) MG PO CAPS: 220.0000 mg | ORAL_CAPSULE | Freq: Every day | ORAL | Status: DC

## 2022-05-04 MED ORDER — SODIUM CHLORIDE 0.9 % IV SOLN
100.0000 mg | INTRAVENOUS | Status: AC
  Administered 2022-05-04 (×2): 100 mg via INTRAVENOUS
  Filled 2022-05-04 (×2): qty 20

## 2022-05-04 MED ORDER — IPRATROPIUM-ALBUTEROL 0.5-2.5 (3) MG/3ML IN SOLN: 3.0000 mL | Freq: Four times a day (QID) | RESPIRATORY_TRACT | Status: DC

## 2022-05-04 MED ORDER — FLUTICASONE PROPIONATE 50 MCG/ACT NA SUSP
2.0000 | Freq: Every day | NASAL | Status: DC
  Administered 2022-05-04 – 2022-05-07 (×4): 2 via NASAL
  Filled 2022-05-04: qty 16

## 2022-05-04 MED ORDER — BUDESONIDE 0.5 MG/2ML IN SUSP
0.5000 mg | Freq: Two times a day (BID) | RESPIRATORY_TRACT | Status: DC
  Administered 2022-05-04 – 2022-05-07 (×6): 0.5 mg via RESPIRATORY_TRACT
  Filled 2022-05-04 (×7): qty 2

## 2022-05-04 MED ORDER — PANTOPRAZOLE SODIUM 40 MG PO TBEC
40.0000 mg | DELAYED_RELEASE_TABLET | Freq: Every day | ORAL | Status: DC
  Administered 2022-05-04 – 2022-05-07 (×4): 40 mg via ORAL
  Filled 2022-05-04 (×4): qty 1

## 2022-05-04 MED ORDER — SODIUM CHLORIDE 0.9 % IV SOLN: 100.0000 mg | Freq: Every day | INTRAVENOUS | Status: DC

## 2022-05-04 MED ORDER — SODIUM CHLORIDE 0.9 % IV SOLN
100.0000 mg | Freq: Every day | INTRAVENOUS | Status: AC
  Administered 2022-05-05 – 2022-05-06 (×2): 100 mg via INTRAVENOUS
  Filled 2022-05-04 (×2): qty 20

## 2022-05-04 MED ORDER — INSULIN GLARGINE-YFGN 100 UNIT/ML ~~LOC~~ SOLN
5.0000 [IU] | Freq: Once | SUBCUTANEOUS | Status: AC
  Administered 2022-05-04: 5 [IU] via SUBCUTANEOUS
  Filled 2022-05-04: qty 0.05

## 2022-05-04 MED ORDER — NETARSUDIL-LATANOPROST 0.02-0.005 % OP SOLN: 1.0000 [drp] | Freq: Every day | OPHTHALMIC | Status: DC

## 2022-05-04 MED ORDER — SODIUM CHLORIDE 0.9 % IV SOLN
500.0000 mg | INTRAVENOUS | Status: DC
  Administered 2022-05-04 – 2022-05-06 (×3): 500 mg via INTRAVENOUS
  Filled 2022-05-04 (×4): qty 5

## 2022-05-04 MED ORDER — LORATADINE 10 MG PO TABS
10.0000 mg | ORAL_TABLET | Freq: Every day | ORAL | Status: DC
  Administered 2022-05-04 – 2022-05-07 (×4): 10 mg via ORAL
  Filled 2022-05-04 (×4): qty 1

## 2022-05-04 MED ORDER — IPRATROPIUM-ALBUTEROL 20-100 MCG/ACT IN AERS
1.0000 | INHALATION_SPRAY | Freq: Four times a day (QID) | RESPIRATORY_TRACT | Status: DC
  Administered 2022-05-04 – 2022-05-05 (×4): 1 via RESPIRATORY_TRACT
  Filled 2022-05-04: qty 4

## 2022-05-04 MED ORDER — VITAMIN C 500 MG PO TABS: 500.0000 mg | ORAL_TABLET | Freq: Every day | ORAL | Status: DC

## 2022-05-04 MED ORDER — APIXABAN 2.5 MG PO TABS: 2.5000 mg | ORAL_TABLET | Freq: Two times a day (BID) | ORAL | Status: DC

## 2022-05-04 MED ORDER — BRINZOLAMIDE 1 % OP SUSP
1.0000 [drp] | Freq: Three times a day (TID) | OPHTHALMIC | Status: DC
  Administered 2022-05-04 – 2022-05-07 (×9): 1 [drp] via OPHTHALMIC
  Filled 2022-05-04: qty 10

## 2022-05-04 MED ORDER — SODIUM CHLORIDE 0.9 % IV SOLN
200.0000 mg | Freq: Once | INTRAVENOUS | Status: DC
  Filled 2022-05-04: qty 40

## 2022-05-04 MED ORDER — GUAIFENESIN-DM 100-10 MG/5ML PO SYRP: 10.0000 mL | ORAL_SOLUTION | ORAL | Status: DC | PRN

## 2022-05-04 MED ORDER — APIXABAN 5 MG PO TABS: 5.0000 mg | ORAL_TABLET | Freq: Two times a day (BID) | ORAL | Status: DC

## 2022-05-04 MED ORDER — APIXABAN 5 MG PO TABS
10.0000 mg | ORAL_TABLET | Freq: Two times a day (BID) | ORAL | Status: DC
  Administered 2022-05-05 – 2022-05-07 (×4): 10 mg via ORAL
  Filled 2022-05-04 (×4): qty 2

## 2022-05-04 MED ORDER — BRIMONIDINE TARTRATE 0.2 % OP SOLN
1.0000 [drp] | Freq: Three times a day (TID) | OPHTHALMIC | Status: DC
  Administered 2022-05-04 – 2022-05-07 (×9): 1 [drp] via OPHTHALMIC
  Filled 2022-05-04: qty 5

## 2022-05-04 NOTE — Progress Notes (Addendum)
ANTICOAGULATION CONSULT NOTE -   Pharmacy Consult for apixaban Indication: DVT   Allergies  Allergen Reactions   Cephalexin Anaphylaxis, Swelling and Other (See Comments)   Micardis [Telmisartan] Other (See Comments)    Hyperkalemia during hospitalization with pulmonary edema   Atorvastatin Other (See Comments)    Other reaction(s): myalgia   Buspirone Other (See Comments)    Other reaction(s): "couldn't breath"   Canagliflozin Other (See Comments)    Pt does not recall   Doxycycline Hyclate Other (See Comments)    Other reaction(s): nausea   Lipitor [Atorvastatin Calcium] Other (See Comments)    myalgia   Tramadol Nausea Only   Tramadol Hcl Other (See Comments)    Patient Measurements: Height: '5\' 4"'$  (162.6 cm) Weight: 93 kg (205 lb 0.4 oz) IBW/kg (Calculated) : 54.7 Heparin Dosing Weight:   Vital Signs: Temp: 98 F (36.7 C) (01/08 1340) Temp Source: Oral (01/08 1340) BP: 127/58 (01/08 1340) Pulse Rate: 73 (01/08 1340)  Labs: Recent Labs    05/03/22 1000 05/03/22 1044 05/03/22 1219 05/03/22 1702 05/03/22 2359 05/04/22 0453 05/04/22 1013  HGB  --  10.7*  --   --   --  11.5*  --   HCT  --  32.4*  --   --   --  34.6*  --   PLT  --  PLATELET CLUMPS NOTED ON SMEAR, COUNT APPEARS DECREASED  --   --   --  138*  --   CREATININE 2.36*  --   --    < > 1.75* 1.47* 1.36*  TROPONINIHS 18*  --  15  --   --   --   --    < > = values in this interval not displayed.    Estimated Creatinine Clearance: 31 mL/min (A) (by C-G formula based on SCr of 1.36 mg/dL (H)).   Medical History: Past Medical History:  Diagnosis Date   Cellulitis and abscess of left leg    CHF (congestive heart failure) (HCC)    COPD (chronic obstructive pulmonary disease) (HCC)    Diabetes mellitus    Glaucoma    Hyperlipidemia    Hypertension     Medications:  Scheduled:   apixaban  2.5 mg Oral BID   vitamin C  500 mg Oral Daily   budesonide (PULMICORT) nebulizer solution  0.5 mg  Nebulization BID   fluticasone  2 spray Each Nare Daily   insulin aspart  0-15 Units Subcutaneous TID WC   insulin aspart  0-5 Units Subcutaneous QHS   [START ON 05/05/2022] insulin glargine-yfgn  15 Units Subcutaneous Daily   insulin glargine-yfgn  5 Units Subcutaneous Once   Ipratropium-Albuterol  1 puff Inhalation Q6H   loratadine  10 mg Oral Daily   methylPREDNISolone (SOLU-MEDROL) injection  0.5 mg/kg Intravenous Q12H   Followed by   Derrill Memo ON 05/07/2022] predniSONE  50 mg Oral Daily   pantoprazole  40 mg Oral Daily   zinc sulfate  220 mg Oral Daily    Assessment: Pharmacy is consulted to dose apixaban in 89 yo female diagnosed with COVID-19. Lower extremity dopplers are showing DVT. No anticoagulation on med rec listed.   Per discussion with MD, would like 24 hours of enoxaparin prior to switching to treatment dose apixaban.   Today, 05/04/22 SCr 1.36 mg/dl, CrCl 31 ml/min  Hgb  11.5, plt 138    Goal of Therapy:  Monitor platelets by anticoagulation protocol: Yes   Plan:  Enoxaparin 90 mg Sq q12h x 2  doses, then apixaban 10 mg PO BID x 7 days then apixaban 5 mg PO BID  Monitor Scr, CBC Monitor for signs and symptoms of bleeding   Royetta Asal, PharmD, BCPS 05/04/2022 2:58 PM

## 2022-05-04 NOTE — Progress Notes (Addendum)
PROGRESS NOTE    [name]  WUJ:[id number] DOB: Apr 04, 1931 DOA: 05/03/2022 PCP: [name], DO    Chief Complaint  Patient presents with   Shortness of Breath    Brief Narrative:  Patient is a pleasant 89 year old female with history of chronic respiratory failure secondary to COPD on chronic home O2 4 L nasal cannula, type 2 diabetes, CKD stage IIIb, glaucoma, hypertension, hyperlipidemia presented with a 1 week history of cough shortness of breath.  Patient noted to have tested herself 5 days prior to admission for COVID which was positive.  Due to worsening symptoms, central chest pressure and congestion patient presented to the ED.  Patient admitted for COVID-19 infection as well as an acute COPD exacerbation.   Assessment & Plan:   Principal Problem:   COVID-19 virus infection Active Problems:   COPD exacerbation (HCC)   AKI (acute kidney injury) ([place])   Chronic diastolic CHF (congestive heart failure) (HCC)   Diabetes mellitus (HCC)   CKD (chronic kidney disease) stage 3, GFR 30-59 ml/min (HCC)   Essential hypertension   Chronic obstructive pulmonary disease, unspecified (HCC)   Hyponatremia   Elevated LFTs   Chronic hypoxic respiratory failure ([place])  #1 COVID-19 infection/acute COPD exacerbation/chronic respiratory failure -Patient presented with a 1 week history of cough, shortness of breath noted to be COVID-positive 5 days prior to admission on home test.  Due to worsening symptoms, wheezing, central chest pressure, congestion, productive cough patient presented to the ED. -COVID-19 PCR positive.  Influenza A and B PCR negative.  Respiratory syncytial virus PCR negative. -Patient noted to have wheezing on presentation. -Patient with some clinical improvement. -D-dimer noted at 3.49, CRP of 7.5. -Follow inflammatory markers of CRP, D-dimer daily. -Check a procalcitonin. -Check lower extremity Dopplers due to elevated D-dimer. -Patient is 89 year old female,  chronic respiratory failure on 4 L nasal cannula high risk for decompensation and as such we will place on IV remdesivir x 3 days, prophylactic dose Eliquis, azithromycin, Pulmicort, Flonase, Claritin, PPI. -Place on Combivent.  -Continue zinc, vitamin C, IV Solu-Medrol. -Supportive care.  2.  Diabetes mellitus type 2, poorly controlled -Hemoglobin A1c 10.3. -CBG 342 this morning. -Elevated CBG in part secondary to steroids. -Hold home regimen metformin, Victoza. -Place on Semglee 15 units daily, SSI. -Consult diabetic coordinator for diabetes education.  3.  Hyponatremia -Likely secondary to hypovolemic hyponatremia in the setting of HCTZ. -HCTZ on hold likely will not resume on admission. -IV fluids.  4.  AKI on CKD stage IIIb -Likely secondary to prerenal azotemia in the setting of HCTZ. -Check a UA with cultures and sensitivities check a urine sodium check a urine creatinine. -Abdominal ultrasound negative for hydronephrosis. -Creatinine currently at 1.36 today from 1.75. -Urine output 2.350 L over the past 24 hours. -IV fluids.  5.  Hypertension -Blood pressure currently stable. -Continue to hold antihypertensive medications at this time.  6.  Chronic diastolic CHF -Stable.  7.  Transaminitis -LFTs trending down. -Check an acute hepatitis panel. -Abdominal ultrasound with no acute abnormality, status postcholecystectomy, mild intrahepatic and extrahepatic biliary ductal dilatation similar to prior study, left renal cyst, splenic cyst which has increased in size compared to prior exam. -Outpatient follow-up.  8.  Bilateral lower extremity DVT -Patient noted to have elevated D-dimer.  Lower extremity Dopplers obtained positive for bilateral DVT per preliminary report. -Full dose lovenox x 24 hours and then transition to treatment dose Eliquis    DVT prophylaxis: Full dose Lovenox Code Status: Full Family Communication:  Updated patient.  No family at  bedside. Disposition: Likely home when clinically improved.  Status is: Inpatient Remains inpatient appropriate because: Severity of illness   Consultants:  None  Procedures:  Lower extremity Dopplers pending Chest x-ray 05/03/2022  Antimicrobials:  IV azithromycin 05/04/2022>>>> IV remdesivir 05/04/2022>>>>   Subjective: Patient sitting up on the side of the bed.  Stated she accidentally wet herself.  Denies any chest pain.  States shortness of breath is improving.  States cough is improving.  Asking when she can go home.  Objective: Vitals:   05/04/22 0719 05/04/22 0929 05/04/22 1340 05/04/22 1452  BP:   (!) 127/58   Pulse:   73   Resp: 16  20   Temp:   98 F (36.7 C)   TempSrc:   Oral   SpO2:  95% 97% 93%  Weight:      Height:        Intake/Output Summary (Last 24 hours) at 05/04/2022 1459 Last data filed at 05/04/2022 0629 Gross per 24 hour  Intake 1867.03 ml  Output 2350 ml  Net -482.97 ml   Filed Weights   05/03/22 0918  Weight: 93 kg    Examination:  General exam: Appears calm and comfortable  Respiratory system: Minimal expiratory wheezing.  Fair air movement.  Speaking in full sentences.  On 4 L nasal cannula.  Cardiovascular system: S1 & S2 heard, RRR. No JVD, murmurs, rubs, gallops or clicks. No pedal edema. Gastrointestinal system: Abdomen is nondistended, soft and nontender. No organomegaly or masses felt. Normal bowel sounds heard. Central nervous system: Alert and oriented. No focal neurological deficits. Extremities: Symmetric 5 x 5 power. Skin: No rashes, lesions or ulcers Psychiatry: Judgement and insight appear normal. Mood & affect appropriate.     Data Reviewed: I have personally reviewed following labs and imaging studies  CBC: Recent Labs  Lab 05/03/22 1044 05/04/22 0453  WBC 11.8* 5.5  NEUTROABS 9.6* 5.0  HGB 10.7* 11.5*  HCT 32.4* 34.6*  MCV 88.0 87.6  PLT PLATELET CLUMPS NOTED ON SMEAR, COUNT APPEARS DECREASED 138*    Basic  Metabolic Panel: Recent Labs  Lab 05/03/22 1000 05/03/22 1702 05/03/22 2359 05/04/22 0453 05/04/22 1013 05/04/22 1231  NA 125* 131* 126* 128* 127*  --   K 4.8 3.7 5.2* 5.6* 4.8  --   CL 89* 107 93* 93* 93*  --   CO2 25 17* '24 26 25  '$ --   GLUCOSE 253* 235* 377* 388* 415* 405*  BUN 97* 68* 86* 76* 74*  --   CREATININE 2.36* 1.23* 1.75* 1.47* 1.36*  --   CALCIUM 8.8* 6.1* 8.8* 9.3 8.9  --   MG  --   --   --   --  1.9  --   PHOS  --  2.7 3.6 4.2 4.0  --     GFR: Estimated Creatinine Clearance: 31 mL/min (A) (by C-G formula based on SCr of 1.36 mg/dL (H)).  Liver Function Tests: Recent Labs  Lab 05/03/22 1000 05/03/22 1702 05/03/22 2359 05/04/22 0453 05/04/22 1013  AST 43*  --   --   --  26  ALT 60*  --   --   --  48*  ALKPHOS 127*  --   --   --  121  BILITOT 0.8  --   --   --  0.8  PROT 6.5  --   --   --  6.7  ALBUMIN 2.9* 1.9* 2.6* 2.8* 3.1*  CBG: Recent Labs  Lab 05/03/22 1649 05/03/22 2050 05/04/22 0745 05/04/22 1151  GLUCAP 305* 381* 342* 443*     Recent Results (from the past 240 hour(s))  Resp panel by RT-PCR (RSV, Flu A&B, Covid) Anterior Nasal Swab     Status: Abnormal   Collection Time: 05/03/22 10:09 AM   Specimen: Anterior Nasal Swab  Result Value Ref Range Status   SARS Coronavirus 2 by RT PCR POSITIVE (A) NEGATIVE Final    Comment: (NOTE) SARS-CoV-2 target nucleic acids are DETECTED.  The SARS-CoV-2 RNA is generally detectable in upper respiratory specimens during the acute phase of infection. Positive results are indicative of the presence of the identified virus, but do not rule out bacterial infection or co-infection with other pathogens not detected by the test. Clinical correlation with patient history and other diagnostic information is necessary to determine patient infection status. The expected result is Negative.  Fact Sheet for Patients: EntrepreneurPulse.com.au  Fact Sheet for Healthcare  Providers: IncredibleEmployment.be  This test is not yet approved or cleared by the [place] FDA and  has been authorized for detection and/or diagnosis of SARS-CoV-2 by FDA under an Emergency Use Authorization (EUA).  This EUA will remain in effect (meaning this test can be used) for the duration of  the COVID-19 declaration under Section 564(b)(1) of the A ct, 21 U.S.C. section 360bbb-3(b)(1), unless the authorization is terminated or revoked sooner.     Influenza A by PCR NEGATIVE NEGATIVE Final   Influenza B by PCR NEGATIVE NEGATIVE Final    Comment: (NOTE) The Xpert Xpress SARS-CoV-2/FLU/RSV plus assay is intended as an aid in the diagnosis of influenza from Nasopharyngeal swab specimens and should not be used as a sole basis for treatment. Nasal washings and aspirates are unacceptable for Xpert Xpress SARS-CoV-2/FLU/RSV testing.  Fact Sheet for Patients: EntrepreneurPulse.com.au  Fact Sheet for Healthcare Providers: IncredibleEmployment.be  This test is not yet approved or cleared by the [place] FDA and has been authorized for detection and/or diagnosis of SARS-CoV-2 by FDA under an Emergency Use Authorization (EUA). This EUA will remain in effect (meaning this test can be used) for the duration of the COVID-19 declaration under Section 564(b)(1) of the Act, 21 U.S.C. section 360bbb-3(b)(1), unless the authorization is terminated or revoked.     Resp Syncytial Virus by PCR NEGATIVE NEGATIVE Final    Comment: (NOTE) Fact Sheet for Patients: EntrepreneurPulse.com.au  Fact Sheet for Healthcare Providers: IncredibleEmployment.be  This test is not yet approved or cleared by the [place] FDA and has been authorized for detection and/or diagnosis of SARS-CoV-2 by FDA under an Emergency Use Authorization (EUA). This EUA will remain in effect (meaning this test can be  used) for the duration of the COVID-19 declaration under Section 564(b)(1) of the Act, 21 U.S.C. section 360bbb-3(b)(1), unless the authorization is terminated or revoked.  Performed at [facility], [place] [address] [place], [place] [address]          Radiology Studies: US Abdomen Complete  Result Date: 05/03/2022 CLINICAL DATA:  Acute kidney injury, elevated LFTs. EXAM: ABDOMEN ULTRASOUND COMPLETE COMPARISON:  CT abdomen and pelvis 11/14/2010. CT angiogram chest 12/06/2019. FINDINGS: Gallbladder: Gallbladder is surgically absent. Common bile duct: Diameter: 10 mm, similar to the prior study. There is mild intrahepatic biliary ductal dilatation. Liver: No focal lesion identified. Within normal limits in parenchymal echogenicity. Portal vein is patent on color Doppler imaging with normal direction of blood flow towards the liver. IVC: No abnormality visualized.  Pancreas: Limited evaluation secondary to overlying bowel gas and body habitus. Spleen: Normal in size. There is a 3.4 x 2.8 x 2.4 cm cyst in the spleen which has increased in size compared to prior exam. Right Kidney: Length: 9.3 cm. Echogenicity within normal limits. No mass or hydronephrosis visualized. Left Kidney: Length: 9.7 cm. Echogenicity within normal limits. No hydronephrosis. There are 2 cysts identified in the left kidney. One measures 2.0 x 1.2 x 2.2 cm. The other measures 2.1 x 2.3 x 2.6 cm. Abdominal aorta: No aneurysm visualized. Visualized portion measures up to 2.4 cm. Distal abdominal aorta and bifurcation not visualized secondary to overlying bowel gas. Other findings: None. IMPRESSION: 1. No acute abnormality. 2. Status post cholecystectomy. 3. Mild intrahepatic and extrahepatic biliary ductal dilatation, similar to the prior study. 4. Left renal cysts. 5. Splenic cyst which has increased in size compared to prior exam. Electronically Signed   By: Ronney Asters M.D.   On: 05/03/2022 15:27   DG Chest  Port 1 View  Result Date: 05/03/2022 CLINICAL DATA:  Shortness of breath with productive cough for 1 week. Recently diagnosed with COVID. EXAM: PORTABLE CHEST 1 VIEW COMPARISON:  Radiographs 06/16/2021 and 12/08/2020.  CT 12/06/2019. FINDINGS: 0953 hours. The heart size and mediastinal contours are stable with aortic atherosclerosis. There is stable mild atelectasis or scarring at the left lung base. No new airspace disease, edema, significant pleural effusion or pneumothorax. The bones appear unchanged with mild thoracic spondylosis and postsurgical changes at the right shoulder. No acute osseous findings are demonstrated. Telemetry leads overlie the chest. IMPRESSION: No evidence of acute cardiopulmonary process. Stable mild left basilar atelectasis or scarring. Electronically Signed   By: Richardean Sale M.D.   On: 05/03/2022 10:37        Scheduled Meds:  apixaban  2.5 mg Oral BID   vitamin C  500 mg Oral Daily   budesonide (PULMICORT) nebulizer solution  0.5 mg Nebulization BID   fluticasone  2 spray Each Nare Daily   insulin aspart  0-15 Units Subcutaneous TID WC   insulin aspart  0-5 Units Subcutaneous QHS   [START ON 05/05/2022] insulin glargine-yfgn  15 Units Subcutaneous Daily   insulin glargine-yfgn  5 Units Subcutaneous Once   Ipratropium-Albuterol  1 puff Inhalation Q6H   loratadine  10 mg Oral Daily   methylPREDNISolone (SOLU-MEDROL) injection  0.5 mg/kg Intravenous Q12H   Followed by   Derrill Memo ON 05/07/2022] predniSONE  50 mg Oral Daily   pantoprazole  40 mg Oral Daily   zinc sulfate  220 mg Oral Daily   Continuous Infusions:  sodium chloride 100 mL/hr at 05/04/22 1411   azithromycin Stopped (05/04/22 1410)   [START ON 05/05/2022] remdesivir 100 mg in sodium chloride 0.9 % 100 mL IVPB       LOS: 1 day    Time spent: 40 minutes    Irine Seal, MD [facility]   To contact the attending provider between 7A-7P or the covering provider during after hours 7P-7A,  please log into the web site [email or web address] and access using universal [place] password for that web site. If you do not have the password, please call the hospital operator.  05/04/2022, 2:59 PM

## 2022-05-04 NOTE — Progress Notes (Signed)
Bilateral lower extremity venous duplex has been completed. Preliminary results can be found in CV Proc through chart review.  Results were given to Dr. Grandville Silos.  05/04/22 3:42 PM [name] RVT

## 2022-05-04 NOTE — Discharge Instructions (Addendum)
Information on my medicine - ELIQUIS (apixaban)     Why was Eliquis prescribed for you? Eliquis was prescribed to treat blood clots that may have been found in the veins of your legs (deep vein thrombosis) or in your lungs (pulmonary embolism) and to reduce the risk of them occurring again.  What do You need to know about Eliquis ? The starting dose is 10 mg (two 5 mg tablets) taken TWICE daily for the FIRST SEVEN (7) DAYS, then on (enter date)  05/12/22  the dose is reduced to ONE 5 mg tablet taken TWICE daily.  Eliquis may be taken with or without food.   Try to take the dose about the same time in the morning and in the evening. If you have difficulty swallowing the tablet whole please discuss with your pharmacist how to take the medication safely.  Take Eliquis exactly as prescribed and DO NOT stop taking Eliquis without talking to the doctor who prescribed the medication.  Stopping may increase your risk of developing a new blood clot.  Refill your prescription before you run out.  After discharge, you should have regular check-up appointments with your healthcare provider that is prescribing your Eliquis.    What do you do if you miss a dose? If a dose of ELIQUIS is not taken at the scheduled time, take it as soon as possible on the same day and twice-daily administration should be resumed. The dose should not be doubled to make up for a missed dose.  Important Safety Information A possible side effect of Eliquis is bleeding. You should call your healthcare provider right away if you experience any of the following: Bleeding from an injury or your nose that does not stop. Unusual colored urine (red or dark brown) or unusual colored stools (red or black). Unusual bruising for unknown reasons. A serious fall or if you hit your head (even if there is no bleeding).  Some medicines may interact with Eliquis and might increase your risk of bleeding or clotting while on Eliquis.  To help avoid this, consult your healthcare provider or pharmacist prior to using any new prescription or non-prescription medications, including herbals, vitamins, non-steroidal anti-inflammatory drugs (NSAIDs) and supplements.  This website has more information on Eliquis (apixaban): http://www.eliquis.com/eliquis/home

## 2022-05-04 NOTE — Progress Notes (Signed)
Mobility Specialist - Progress Note  Pre-mobility: 96% SpO2 During mobility: 93% SpO2 Post-mobility: 93% SPO2   05/04/22 1222  Oxygen Therapy  O2 Device Nasal Cannula  O2 Flow Rate (L/min) 4 L/min  Patient Activity (if Appropriate) Ambulating  Mobility  Activity Ambulated with assistance in room  Level of Assistance Standby assist, set-up cues, supervision of patient - no hands on  Assistive Device Front wheel walker  Distance Ambulated (ft) 30 ft  Range of Motion/Exercises Active  Activity Response Tolerated well  Mobility Referral Yes  $Mobility charge 1 Mobility   Pt was found in bed and agreeable to ambulate. Had x1 seated rest break at ~ 15 ft and at EOS returned to recliner chair with necessities in reach. Chair alarm on.   Ferd Hibbs Mobility Specialist

## 2022-05-04 NOTE — Inpatient Diabetes Management (Signed)
Inpatient Diabetes Program Recommendations  AACE/ADA: New Consensus Statement on Inpatient Glycemic Control (2015)  Target Ranges:  Prepandial:   less than 140 mg/dL      Peak postprandial:   less than 180 mg/dL (1-2 hours)      Critically ill patients:  140 - 180 mg/dL   Lab Results  Component Value Date   GLUCAP 443 (H) 05/04/2022   HGBA1C 10.3 (H) 05/03/2022    Review of Glycemic Control  Diabetes history: DM2 Outpatient Diabetes medications: Victoza 0.6 QD, metformin 500 mg QD Current orders for Inpatient glycemic control: Semglee 15 QD, Novolog 0-15 TID with meals and 0-5 HS On Solumedrol 46.25 mg Q12H  HgbA1C - 10.3%  Inpatient Diabetes Program Recommendations:    Noted Diabetes Coordinator Consult.  Will speak with pt about her diabetes when appropriate.  Hyperglycemia likely d/t steroids. May need meal coverage insulin as appetite improves.  Follow glucose trends and make recs on insulin titration.  Continue to follow.  Thank you. Lorenda Peck, RD, LDN, [place] Inpatient Diabetes Coordinator [phone]

## 2022-05-05 ENCOUNTER — Other Ambulatory Visit (HOSPITAL_COMMUNITY): Payer: Self-pay

## 2022-05-05 DIAGNOSIS — I5032 Chronic diastolic (congestive) heart failure: Secondary | ICD-10-CM | POA: Diagnosis not present

## 2022-05-05 DIAGNOSIS — N179 Acute kidney failure, unspecified: Secondary | ICD-10-CM | POA: Diagnosis not present

## 2022-05-05 DIAGNOSIS — R7989 Other specified abnormal findings of blood chemistry: Secondary | ICD-10-CM | POA: Diagnosis not present

## 2022-05-05 DIAGNOSIS — J441 Chronic obstructive pulmonary disease with (acute) exacerbation: Secondary | ICD-10-CM | POA: Diagnosis not present

## 2022-05-05 LAB — COMPREHENSIVE METABOLIC PANEL
ALT: 37 U/L (ref 0–44)
AST: 17 U/L (ref 15–41)
Albumin: 2.5 g/dL — ABNORMAL LOW (ref 3.5–5.0)
Alkaline Phosphatase: 94 U/L (ref 38–126)
Anion gap: 11 (ref 5–15)
BUN: 63 mg/dL — ABNORMAL HIGH (ref 8–23)
CO2: 23 mmol/L (ref 22–32)
Calcium: 8.9 mg/dL (ref 8.9–10.3)
Chloride: 102 mmol/L (ref 98–111)
Creatinine, Ser: 1.1 mg/dL — ABNORMAL HIGH (ref 0.44–1.00)
GFR, Estimated: 48 mL/min — ABNORMAL LOW (ref >60)
Glucose, Bld: 238 mg/dL — ABNORMAL HIGH (ref 70–99)
Potassium: 5.1 mmol/L (ref 3.5–5.1)
Sodium: 136 mmol/L (ref 135–145)
Total Bilirubin: 0.4 mg/dL (ref 0.3–1.2)
Total Protein: 5.6 g/dL — ABNORMAL LOW (ref 6.5–8.1)

## 2022-05-05 LAB — CBC WITH DIFFERENTIAL/PLATELET
Abs Immature Granulocytes: 0.24 10*3/uL — ABNORMAL HIGH (ref 0.00–0.07)
Basophils Absolute: 0 10*3/uL (ref 0.0–0.1)
Basophils Relative: 0 %
Eosinophils Absolute: 0 10*3/uL (ref 0.0–0.5)
Eosinophils Relative: 0 %
HCT: 34.5 % — ABNORMAL LOW (ref 36.0–46.0)
Hemoglobin: 11.3 g/dL — ABNORMAL LOW (ref 12.0–15.0)
Immature Granulocytes: 3 %
Lymphocytes Relative: 7 %
Lymphs Abs: 0.7 10*3/uL (ref 0.7–4.0)
MCH: 29 pg (ref 26.0–34.0)
MCHC: 32.8 g/dL (ref 30.0–36.0)
MCV: 88.5 fL (ref 80.0–100.0)
Monocytes Absolute: 0.3 10*3/uL (ref 0.1–1.0)
Monocytes Relative: 3 %
Neutro Abs: 8.3 10*3/uL — ABNORMAL HIGH (ref 1.7–7.7)
Neutrophils Relative %: 87 %
Platelets: DECREASED 10*3/uL (ref 150–400)
RBC: 3.9 MIL/uL (ref 3.87–5.11)
RDW: 12.9 % (ref 11.5–15.5)
WBC: 9.5 10*3/uL (ref 4.0–10.5)
nRBC: 0 % (ref 0.0–0.2)

## 2022-05-05 LAB — MAGNESIUM: Magnesium: 1.5 mg/dL — ABNORMAL LOW (ref 1.7–2.4)

## 2022-05-05 LAB — D-DIMER, QUANTITATIVE: D-Dimer, Quant: 3.32 ug/mL-FEU — ABNORMAL HIGH (ref 0.00–0.50)

## 2022-05-05 LAB — GLUCOSE, CAPILLARY
Glucose-Capillary: 274 mg/dL — ABNORMAL HIGH (ref 70–99)
Glucose-Capillary: 359 mg/dL — ABNORMAL HIGH (ref 70–99)
Glucose-Capillary: 300 mg/dL — ABNORMAL HIGH (ref 70–99)
Glucose-Capillary: 356 mg/dL — ABNORMAL HIGH (ref 70–99)

## 2022-05-05 LAB — PHOSPHORUS: Phosphorus: 2.7 mg/dL (ref 2.5–4.6)

## 2022-05-05 LAB — C-REACTIVE PROTEIN: CRP: 4.7 mg/dL — ABNORMAL HIGH (ref <1.0)

## 2022-05-05 LAB — PROCALCITONIN: Procalcitonin: 0.24 ng/mL

## 2022-05-05 MED ORDER — IPRATROPIUM-ALBUTEROL 20-100 MCG/ACT IN AERS
1.0000 | INHALATION_SPRAY | Freq: Two times a day (BID) | RESPIRATORY_TRACT | Status: DC
  Administered 2022-05-05 – 2022-05-07 (×3): 1 via RESPIRATORY_TRACT

## 2022-05-05 MED ORDER — INSULIN GLARGINE-YFGN 100 UNIT/ML ~~LOC~~ SOLN
18.0000 [IU] | Freq: Every day | SUBCUTANEOUS | Status: DC
  Administered 2022-05-05 – 2022-05-06 (×2): 18 [IU] via SUBCUTANEOUS
  Filled 2022-05-05 (×2): qty 0.18

## 2022-05-05 MED ORDER — MAGNESIUM SULFATE 2 GM/50ML IV SOLN
2.0000 g | Freq: Once | INTRAVENOUS | Status: AC
  Administered 2022-05-05: 2 g via INTRAVENOUS
  Filled 2022-05-05: qty 50

## 2022-05-05 MED ORDER — INSULIN ASPART 100 UNIT/ML IJ SOLN
3.0000 [IU] | Freq: Three times a day (TID) | INTRAMUSCULAR | Status: DC
  Administered 2022-05-05 – 2022-05-07 (×6): 3 [IU] via SUBCUTANEOUS

## 2022-05-05 NOTE — Progress Notes (Signed)
PT given Flutter- has strong,dry, NPC at this time.

## 2022-05-05 NOTE — Progress Notes (Signed)
PROGRESS NOTE    [name]  IRS:[id number] DOB: 04-26-1931 DOA: 05/03/2022 PCP: [name], DO    Chief Complaint  Patient presents with   Shortness of Breath    Brief Narrative:  Patient is a pleasant 89 year old female with history of chronic respiratory failure secondary to COPD on chronic home O2 4 L nasal cannula, type 2 diabetes, CKD stage IIIb, glaucoma, hypertension, hyperlipidemia presented with a 1 week history of cough shortness of breath.  Patient noted to have tested herself 5 days prior to admission for COVID which was positive.  Due to worsening symptoms, central chest pressure and congestion patient presented to the ED.  Patient admitted for COVID-19 infection as well as an acute COPD exacerbation.   Assessment & Plan:   Principal Problem:   COVID-19 virus infection Active Problems:   COPD exacerbation (HCC)   AKI (acute kidney injury) ([place])   Chronic diastolic CHF (congestive heart failure) (HCC)   Diabetes mellitus (HCC)   CKD (chronic kidney disease) stage 3, GFR 30-59 ml/min (HCC)   Essential hypertension   Chronic obstructive pulmonary disease, unspecified (HCC)   Hyponatremia   Elevated LFTs   Chronic hypoxic respiratory failure ([place])  #1 COVID-19 infection/acute COPD exacerbation/chronic respiratory failure -Patient presented with a 1 week history of cough, shortness of breath noted to be COVID-positive 5 days prior to admission on home test.  Due to worsening symptoms, wheezing, central chest pressure, congestion, productive cough patient presented to the ED. -COVID-19 PCR positive.  Influenza A and B PCR negative.  Respiratory syncytial virus PCR negative. -Patient noted to have wheezing on presentation. -Patient improving clinically. -D-dimer noted at 3.49, CRP of 7.5. -Follow inflammatory markers of CRP, D-dimer daily. -Lower extremity Dopplers positive for bilateral DVT. -Patient is 89 year old female, chronic respiratory failure on 4 L  nasal cannula high risk for decompensation and as such we will complete 3-day course of IV remdesivir.  -Continue azithromycin, Pulmicort, Flonase, Claritin, PPI, Combivent, zinc, vitamin C, IV Solu-Medrol.   -Patient started on full dose Eliquis today.   -Supportive care.    2.  Diabetes mellitus type 2, poorly controlled -Hemoglobin A1c 10.3. -CBG 274 this morning. -Elevated CBG in part secondary to steroids. -Hold home regimen metformin, Victoza. -Increase Semglee to 18 units daily.   -Place on NovoLog meal coverage 3 units 3 times daily with meals.  SSI.   -Diabetes coordinator following.   3.  Hyponatremia -Likely secondary to hypovolemic hyponatremia in the setting of HCTZ. -HCTZ on hold likely will not resume on admission. -Improved with hydration. -Saline lock IV fluids.  4.  AKI on CKD stage IIIb -Likely secondary to prerenal azotemia in the setting of HCTZ. -Check a UA with cultures and sensitivities check a urine sodium check a urine creatinine. -Abdominal ultrasound negative for hydronephrosis. -Creatinine currently at 1.10 from 1.36  from 1.75. -Urine output 1.4 L over the past 24 hours. -Saline lock IV fluids.   5.  Hypertension -Stable.   -Continue to hold antihypertensive medications.    6.  Chronic diastolic CHF -Stable.  7.  Transaminitis -LFTs trending down. -Abdominal ultrasound with no acute abnormality, status postcholecystectomy, mild intrahepatic and extrahepatic biliary ductal dilatation similar to prior study, left renal cyst, splenic cyst which has increased in size compared to prior exam. -Outpatient follow-up.  8.  Bilateral lower extremity DVT -Patient noted to have elevated D-dimer.  Lower extremity Dopplers obtained positive for bilateral DVT per preliminary report. -Patient received full dose Lovenox 05/04/2022, will  be transitioned to Eliquis today.     DVT prophylaxis: Full dose Lovenox>>> Eliquis Code Status: Full Family Communication:  Updated patient.  No family at bedside. Disposition: Likely home when clinically improved.  Status is: Inpatient Remains inpatient appropriate because: Severity of illness   Consultants:  None  Procedures:  Lower extremity Dopplers 05/04/2022 Chest x-ray 05/03/2022  Antimicrobials:  IV azithromycin 05/04/2022>>>> IV remdesivir 05/04/2022>>>>   Subjective: Sitting up in chair.  States she is ready to get back in bed.  Feels shortness of breath is improving.  Denies any chest pain.  Denies any abdominal pain.  No bleeding.  Tolerating current diet.    Objective: Vitals:   05/05/22 0433 05/05/22 0848 05/05/22 0931 05/05/22 1307  BP: 129/61   139/66  Pulse: 65   74  Resp: '18  20 16  '$ Temp: 97.8 F (36.6 C)   98.1 F (36.7 C)  TempSrc: Oral   Oral  SpO2: 99% 96%  100%  Weight:      Height:        Intake/Output Summary (Last 24 hours) at 05/05/2022 1317 Last data filed at 05/05/2022 1050 Gross per 24 hour  Intake 1879.95 ml  Output 1401 ml  Net 478.95 ml    Filed Weights   05/03/22 0918  Weight: 93 kg    Examination:  General exam: NAD. Respiratory system: Minimal expiratory wheezing.  Fair air movement.  No crackles.  No rhonchi.  Speaking in full sentences.   Cardiovascular system: Regular rate and rhythm no murmurs rubs or gallops.  No JVD.  No lower extremity edema.   Gastrointestinal system: Abdomen is soft, nontender, nondistended, positive bowel sounds.  No rebound.  No guarding.  Central nervous system: Alert and oriented. No focal neurological deficits. Extremities: Symmetric 5 x 5 power. Skin: No rashes, lesions or ulcers Psychiatry: Judgement and insight appear normal. Mood & affect appropriate.     Data Reviewed: I have personally reviewed following labs and imaging studies  CBC: Recent Labs  Lab 05/03/22 1044 05/04/22 0453 05/05/22 0426  WBC 11.8* 5.5 9.5  NEUTROABS 9.6* 5.0 8.3*  HGB 10.7* 11.5* 11.3*  HCT 32.4* 34.6* 34.5*  MCV 88.0 87.6 88.5   PLT PLATELET CLUMPS NOTED ON SMEAR, COUNT APPEARS DECREASED 138* PLATELET CLUMPS NOTED ON SMEAR, COUNT APPEARS DECREASED     Basic Metabolic Panel: Recent Labs  Lab 05/03/22 1702 05/03/22 2359 05/04/22 0453 05/04/22 1013 05/04/22 1231 05/05/22 0426  NA 131* 126* 128* 127*  --  136  K 3.7 5.2* 5.6* 4.8  --  5.1  CL 107 93* 93* 93*  --  102  CO2 17* '24 26 25  '$ --  23  GLUCOSE 235* 377* 388* 415* 405* 238*  BUN 68* 86* 76* 74*  --  63*  CREATININE 1.23* 1.75* 1.47* 1.36*  --  1.10*  CALCIUM 6.1* 8.8* 9.3 8.9  --  8.9  MG  --   --   --  1.9  --  1.5*  PHOS 2.7 3.6 4.2 4.0  --  2.7     GFR: Estimated Creatinine Clearance: 38.3 mL/min (A) (by C-G formula based on SCr of 1.1 mg/dL (H)).  Liver Function Tests: Recent Labs  Lab 05/03/22 1000 05/03/22 1702 05/03/22 2359 05/04/22 0453 05/04/22 1013 05/05/22 0426  AST 43*  --   --   --  26 17  ALT 60*  --   --   --  48* 37  ALKPHOS 127*  --   --   --  121 94  BILITOT 0.8  --   --   --  0.8 0.4  PROT 6.5  --   --   --  6.7 5.6*  ALBUMIN 2.9* 1.9* 2.6* 2.8* 3.1* 2.5*     CBG: Recent Labs  Lab 05/04/22 1151 05/04/22 1658 05/04/22 2102 05/05/22 0734 05/05/22 1148  GLUCAP 443* 317* 206* 274* 359*      Recent Results (from the past 240 hour(s))  Resp panel by RT-PCR (RSV, Flu A&B, Covid) Anterior Nasal Swab     Status: Abnormal   Collection Time: 05/03/22 10:09 AM   Specimen: Anterior Nasal Swab  Result Value Ref Range Status   SARS Coronavirus 2 by RT PCR POSITIVE (A) NEGATIVE Final    Comment: (NOTE) SARS-CoV-2 target nucleic acids are DETECTED.  The SARS-CoV-2 RNA is generally detectable in upper respiratory specimens during the acute phase of infection. Positive results are indicative of the presence of the identified virus, but do not rule out bacterial infection or co-infection with other pathogens not detected by the test. Clinical correlation with patient history and other diagnostic information is  necessary to determine patient infection status. The expected result is Negative.  Fact Sheet for Patients: EntrepreneurPulse.com.au  Fact Sheet for Healthcare Providers: IncredibleEmployment.be  This test is not yet approved or cleared by the [place] FDA and  has been authorized for detection and/or diagnosis of SARS-CoV-2 by FDA under an Emergency Use Authorization (EUA).  This EUA will remain in effect (meaning this test can be used) for the duration of  the COVID-19 declaration under Section 564(b)(1) of the A ct, 21 U.S.C. section 360bbb-3(b)(1), unless the authorization is terminated or revoked sooner.     Influenza A by PCR NEGATIVE NEGATIVE Final   Influenza B by PCR NEGATIVE NEGATIVE Final    Comment: (NOTE) The Xpert Xpress SARS-CoV-2/FLU/RSV plus assay is intended as an aid in the diagnosis of influenza from Nasopharyngeal swab specimens and should not be used as a sole basis for treatment. Nasal washings and aspirates are unacceptable for Xpert Xpress SARS-CoV-2/FLU/RSV testing.  Fact Sheet for Patients: EntrepreneurPulse.com.au  Fact Sheet for Healthcare Providers: IncredibleEmployment.be  This test is not yet approved or cleared by the [place] FDA and has been authorized for detection and/or diagnosis of SARS-CoV-2 by FDA under an Emergency Use Authorization (EUA). This EUA will remain in effect (meaning this test can be used) for the duration of the COVID-19 declaration under Section 564(b)(1) of the Act, 21 U.S.C. section 360bbb-3(b)(1), unless the authorization is terminated or revoked.     Resp Syncytial Virus by PCR NEGATIVE NEGATIVE Final    Comment: (NOTE) Fact Sheet for Patients: EntrepreneurPulse.com.au  Fact Sheet for Healthcare Providers: IncredibleEmployment.be  This test is not yet approved or cleared by the [place] FDA  and has been authorized for detection and/or diagnosis of SARS-CoV-2 by FDA under an Emergency Use Authorization (EUA). This EUA will remain in effect (meaning this test can be used) for the duration of the COVID-19 declaration under Section 564(b)(1) of the Act, 21 U.S.C. section 360bbb-3(b)(1), unless the authorization is terminated or revoked.  Performed at [facility], [place] [address] [place],  [address]   Respiratory (~20 pathogens) panel by PCR     Status: None   Collection Time: 05/04/22  5:41 PM   Specimen: Nasopharyngeal Swab; Respiratory  Result Value Ref Range Status   Adenovirus NOT DETECTED NOT DETECTED Final   Coronavirus 229E NOT DETECTED NOT DETECTED Final  Comment: (NOTE) The Coronavirus on the Respiratory Panel, DOES NOT test for the novel  Coronavirus (2019 nCoV)    Coronavirus HKU1 NOT DETECTED NOT DETECTED Final   Coronavirus NL63 NOT DETECTED NOT DETECTED Final   Coronavirus OC43 NOT DETECTED NOT DETECTED Final   Metapneumovirus NOT DETECTED NOT DETECTED Final   Rhinovirus / Enterovirus NOT DETECTED NOT DETECTED Final   Influenza A NOT DETECTED NOT DETECTED Final   Influenza B NOT DETECTED NOT DETECTED Final   Parainfluenza Virus 1 NOT DETECTED NOT DETECTED Final   Parainfluenza Virus 2 NOT DETECTED NOT DETECTED Final   Parainfluenza Virus 3 NOT DETECTED NOT DETECTED Final   Parainfluenza Virus 4 NOT DETECTED NOT DETECTED Final   Respiratory Syncytial Virus NOT DETECTED NOT DETECTED Final   Bordetella pertussis NOT DETECTED NOT DETECTED Final   Bordetella Parapertussis NOT DETECTED NOT DETECTED Final   Chlamydophila pneumoniae NOT DETECTED NOT DETECTED Final   Mycoplasma pneumoniae NOT DETECTED NOT DETECTED Final    Comment: Performed at [facility], [place] [address] [place], [place] [address]         Radiology Studies: VAS [place] LOWER EXTREMITY VENOUS (DVT)  Result Date: 05/04/2022  Lower Venous DVT Study Patient  Name:  [name]  Date of Exam:   05/04/2022 Medical Rec #: [id number]        Accession #:    [id number] Date of Birth: [birth year]        Patient Gender: F Patient Age:   24 years Exam Location:  [facility] Procedure:      VAS [place] LOWER EXTREMITY VENOUS (DVT) Referring Phys: Quillian Quince Zahra Peffley --------------------------------------------------------------------------------  Indications: Elevated Ddimer.  Risk Factors: COVID 19 positive. Comparison Study: No prior studies. Performing Technologist: Oliver Hum RVT  Examination Guidelines: A complete evaluation includes B-mode imaging, spectral Doppler, color Doppler, and power Doppler as needed of all accessible portions of each vessel. Bilateral testing is considered an integral part of a complete examination. Limited examinations for reoccurring indications may be performed as noted. The reflux portion of the exam is performed with the patient in reverse Trendelenburg.  +---------+---------------+---------+-----------+----------+--------------+ RIGHT    CompressibilityPhasicitySpontaneityPropertiesThrombus Aging +---------+---------------+---------+-----------+----------+--------------+ CFV      Full           Yes      Yes                                 +---------+---------------+---------+-----------+----------+--------------+ SFJ      Full                                                        +---------+---------------+---------+-----------+----------+--------------+ FV Prox  Full                                                        +---------+---------------+---------+-----------+----------+--------------+ FV Mid   Full                                                        +---------+---------------+---------+-----------+----------+--------------+  FV DistalFull                                                        +---------+---------------+---------+-----------+----------+--------------+ PFV      Full                                                         +---------+---------------+---------+-----------+----------+--------------+ POP      Partial        Yes      Yes                  Acute          +---------+---------------+---------+-----------+----------+--------------+ PTV      Partial                                      Acute          +---------+---------------+---------+-----------+----------+--------------+ PERO     Partial                                      Acute          +---------+---------------+---------+-----------+----------+--------------+ Gastroc  Full                                                        +---------+---------------+---------+-----------+----------+--------------+   +---------+---------------+---------+-----------+----------+--------------+ LEFT     CompressibilityPhasicitySpontaneityPropertiesThrombus Aging +---------+---------------+---------+-----------+----------+--------------+ CFV      Full           Yes      Yes                                 +---------+---------------+---------+-----------+----------+--------------+ SFJ      Full                                                        +---------+---------------+---------+-----------+----------+--------------+ FV Prox  Full                                                        +---------+---------------+---------+-----------+----------+--------------+ FV Mid   Full                                                        +---------+---------------+---------+-----------+----------+--------------+ FV DistalFull                                                        +---------+---------------+---------+-----------+----------+--------------+  PFV      Full                                                        +---------+---------------+---------+-----------+----------+--------------+ POP      Full           Yes      Yes                                  +---------+---------------+---------+-----------+----------+--------------+ PTV      None                                         Acute          +---------+---------------+---------+-----------+----------+--------------+ PERO     None                                         Acute          +---------+---------------+---------+-----------+----------+--------------+     Summary: RIGHT: - Findings consistent with acute deep vein thrombosis involving the right popliteal vein, right posterior tibial veins, and right peroneal veins. - No cystic structure found in the popliteal fossa.  LEFT: - Findings consistent with acute deep vein thrombosis involving the left posterior tibial veins, and left peroneal veins. - No cystic structure found in the popliteal fossa.  *See table(s) above for measurements and observations. Electronically signed by Monica Martinez MD on 05/04/2022 at 3:48:50 PM.    Final    US Abdomen Complete  Result Date: 05/03/2022 CLINICAL DATA:  Acute kidney injury, elevated LFTs. EXAM: ABDOMEN ULTRASOUND COMPLETE COMPARISON:  CT abdomen and pelvis 11/14/2010. CT angiogram chest 12/06/2019. FINDINGS: Gallbladder: Gallbladder is surgically absent. Common bile duct: Diameter: 10 mm, similar to the prior study. There is mild intrahepatic biliary ductal dilatation. Liver: No focal lesion identified. Within normal limits in parenchymal echogenicity. Portal vein is patent on color Doppler imaging with normal direction of blood flow towards the liver. IVC: No abnormality visualized. Pancreas: Limited evaluation secondary to overlying bowel gas and body habitus. Spleen: Normal in size. There is a 3.4 x 2.8 x 2.4 cm cyst in the spleen which has increased in size compared to prior exam. Right Kidney: Length: 9.3 cm. Echogenicity within normal limits. No mass or hydronephrosis visualized. Left Kidney: Length: 9.7 cm. Echogenicity within normal limits. No hydronephrosis. There are 2 cysts  identified in the left kidney. One measures 2.0 x 1.2 x 2.2 cm. The other measures 2.1 x 2.3 x 2.6 cm. Abdominal aorta: No aneurysm visualized. Visualized portion measures up to 2.4 cm. Distal abdominal aorta and bifurcation not visualized secondary to overlying bowel gas. Other findings: None. IMPRESSION: 1. No acute abnormality. 2. Status post cholecystectomy. 3. Mild intrahepatic and extrahepatic biliary ductal dilatation, similar to the prior study. 4. Left renal cysts. 5. Splenic cyst which has increased in size compared to prior exam. Electronically Signed   By: Ronney Asters M.D.   On: 05/03/2022 15:27        Scheduled Meds:  apixaban  10 mg Oral BID   Followed  by   Derrill Memo ON 05/12/2022] apixaban  5 mg Oral BID   vitamin C  500 mg Oral Daily   brinzolamide  1 drop Both Eyes TID   And   brimonidine  1 drop Both Eyes TID   budesonide (PULMICORT) nebulizer solution  0.5 mg Nebulization BID   fluticasone  2 spray Each Nare Daily   insulin aspart  0-15 Units Subcutaneous TID WC   insulin aspart  0-5 Units Subcutaneous QHS   insulin glargine-yfgn  18 Units Subcutaneous Daily   Ipratropium-Albuterol  1 puff Inhalation BID   loratadine  10 mg Oral Daily   methylPREDNISolone (SOLU-MEDROL) injection  0.5 mg/kg Intravenous Q12H   Followed by   Derrill Memo ON 05/07/2022] predniSONE  50 mg Oral Daily   Netarsudil-Latanoprost  1 drop Both Eyes QHS   pantoprazole  40 mg Oral Daily   zinc sulfate  220 mg Oral Daily   Continuous Infusions:  sodium chloride 100 mL/hr at 05/05/22 0929   azithromycin 500 mg (05/05/22 1211)   remdesivir 100 mg in sodium chloride 0.9 % 100 mL IVPB 100 mg (05/05/22 0930)     LOS: 2 days    Time spent: 40 minutes    Irine Seal, MD [facility]   To contact the attending provider between 7A-7P or the covering provider during after hours 7P-7A, please log into the web site [email or web address] and access using universal [place] password for that web site.  If you do not have the password, please call the hospital operator.  05/05/2022, 1:17 PM

## 2022-05-05 NOTE — TOC Benefit Eligibility Note (Signed)
Patient Teacher, English as a foreign language completed.    The patient is currently admitted and upon discharge could be taking Eliquis 5 mg.  The current 30 day co-pay is $43.00.   The patient is insured through [place] ([place] is the only [employer] that takes [employer])   Lyndel Safe, [place] Patient [place] Patient Advocate Team Direct Number: [phone]  Fax: [phone]

## 2022-05-05 NOTE — Progress Notes (Signed)
Mobility Specialist - Progress Note   05/05/22 1314  Mobility  Activity Transferred from chair to bed  Level of Assistance Standby assist, set-up cues, supervision of patient - no hands on  Assistive Device None  Range of Motion/Exercises Active  Activity Response Tolerated well  Mobility Referral Yes  $Mobility charge 1 Mobility   Pt was found on recliner chair wanting to go back to bed. At EOS returned to bed with all necessities in reach.  Ferd Hibbs Mobility Specialist

## 2022-05-06 DIAGNOSIS — J441 Chronic obstructive pulmonary disease with (acute) exacerbation: Secondary | ICD-10-CM | POA: Diagnosis not present

## 2022-05-06 DIAGNOSIS — U071 COVID-19: Secondary | ICD-10-CM | POA: Diagnosis not present

## 2022-05-06 DIAGNOSIS — N179 Acute kidney failure, unspecified: Secondary | ICD-10-CM | POA: Diagnosis not present

## 2022-05-06 DIAGNOSIS — I5032 Chronic diastolic (congestive) heart failure: Secondary | ICD-10-CM | POA: Diagnosis not present

## 2022-05-06 LAB — COMPREHENSIVE METABOLIC PANEL
ALT: 30 U/L (ref 0–44)
AST: 14 U/L — ABNORMAL LOW (ref 15–41)
Albumin: 2.6 g/dL — ABNORMAL LOW (ref 3.5–5.0)
Alkaline Phosphatase: 99 U/L (ref 38–126)
Anion gap: 6 (ref 5–15)
BUN: 62 mg/dL — ABNORMAL HIGH (ref 8–23)
CO2: 24 mmol/L (ref 22–32)
Calcium: 8.2 mg/dL — ABNORMAL LOW (ref 8.9–10.3)
Chloride: 100 mmol/L (ref 98–111)
Creatinine, Ser: 1.06 mg/dL — ABNORMAL HIGH (ref 0.44–1.00)
GFR, Estimated: 50 mL/min — ABNORMAL LOW (ref >60)
Glucose, Bld: 343 mg/dL — ABNORMAL HIGH (ref 70–99)
Potassium: 4.9 mmol/L (ref 3.5–5.1)
Sodium: 130 mmol/L — ABNORMAL LOW (ref 135–145)
Total Bilirubin: 0.4 mg/dL (ref 0.3–1.2)
Total Protein: 5.7 g/dL — ABNORMAL LOW (ref 6.5–8.1)

## 2022-05-06 LAB — GLUCOSE, CAPILLARY
Glucose-Capillary: 386 mg/dL — ABNORMAL HIGH (ref 70–99)
Glucose-Capillary: 406 mg/dL — ABNORMAL HIGH (ref 70–99)
Glucose-Capillary: 450 mg/dL — ABNORMAL HIGH (ref 70–99)
Glucose-Capillary: 440 mg/dL — ABNORMAL HIGH (ref 70–99)

## 2022-05-06 LAB — C-REACTIVE PROTEIN: CRP: 2.9 mg/dL — ABNORMAL HIGH (ref <1.0)

## 2022-05-06 LAB — CBC WITH DIFFERENTIAL/PLATELET
Abs Immature Granulocytes: 0.52 10*3/uL — ABNORMAL HIGH (ref 0.00–0.07)
Basophils Absolute: 0.1 10*3/uL (ref 0.0–0.1)
Basophils Relative: 1 %
Eosinophils Absolute: 0.5 10*3/uL (ref 0.0–0.5)
Eosinophils Relative: 4 %
HCT: 36.7 % (ref 36.0–46.0)
Hemoglobin: 11.7 g/dL — ABNORMAL LOW (ref 12.0–15.0)
Immature Granulocytes: 5 %
Lymphocytes Relative: 6 %
Lymphs Abs: 0.7 10*3/uL (ref 0.7–4.0)
MCH: 29 pg (ref 26.0–34.0)
MCHC: 31.9 g/dL (ref 30.0–36.0)
MCV: 91.1 fL (ref 80.0–100.0)
Monocytes Absolute: 0.4 10*3/uL (ref 0.1–1.0)
Monocytes Relative: 4 %
Neutro Abs: 8.4 10*3/uL — ABNORMAL HIGH (ref 1.7–7.7)
Neutrophils Relative %: 80 %
Platelets: 88 10*3/uL — ABNORMAL LOW (ref 150–400)
RBC: 4.03 MIL/uL (ref 3.87–5.11)
RDW: 13.1 % (ref 11.5–15.5)
WBC: 10.4 10*3/uL (ref 4.0–10.5)
nRBC: 0 % (ref 0.0–0.2)

## 2022-05-06 LAB — PHOSPHORUS: Phosphorus: 2.8 mg/dL (ref 2.5–4.6)

## 2022-05-06 LAB — MAGNESIUM: Magnesium: 2.1 mg/dL (ref 1.7–2.4)

## 2022-05-06 LAB — D-DIMER, QUANTITATIVE: D-Dimer, Quant: 3.29 ug/mL-FEU — ABNORMAL HIGH (ref 0.00–0.50)

## 2022-05-06 LAB — PROCALCITONIN: Procalcitonin: <0.1 ng/mL

## 2022-05-06 MED ORDER — INSULIN GLARGINE-YFGN 100 UNIT/ML ~~LOC~~ SOLN
22.0000 [IU] | Freq: Every day | SUBCUTANEOUS | Status: DC
  Administered 2022-05-07: 22 [IU] via SUBCUTANEOUS
  Filled 2022-05-06: qty 0.22

## 2022-05-06 NOTE — Inpatient Diabetes Management (Signed)
Inpatient Diabetes Program Recommendations  AACE/ADA: New Consensus Statement on Inpatient Glycemic Control (2015)  Target Ranges:  Prepandial:   less than 140 mg/dL      Peak postprandial:   less than 180 mg/dL (1-2 hours)      Critically ill patients:  140 - 180 mg/dL   Lab Results  Component Value Date   GLUCAP 356 (H) 05/05/2022   HGBA1C 10.3 (H) 05/03/2022    Review of Glycemic Control  Diabetes history: DM2 Outpatient Diabetes medications: Victoza 0.6 mg QD, metformin 500 mg QD Current orders for Inpatient glycemic control: Novolog 0-15 TID with meals and 0-5 HS + 3 units TID, Semglee 18 units QD  Continues with Solumedrol 46.5 mg BID HgbA1C - 10.3%  Inpatient Diabetes Program Recommendations:    Consider increasing Semglee to 22 units QD  Consider increasing Novolog to 6 units TID with meals  Continue titration with both basal and bolus insulin as needed in the setting of high dose steroids.  Continue to follow.  Thank you. Lorenda Peck, RD, LDN, [place] Inpatient Diabetes Coordinator [phone]

## 2022-05-06 NOTE — Progress Notes (Addendum)
PROGRESS NOTE    [name]  FYB:[id number] DOB: Oct 08, 1930 DOA: 05/03/2022 PCP: [name], DO   Brief Narrative:  The patient is a pleasant 89 year old Caucasian female with a past medical history significant for but not limited to chronic respiratory failure secondary to COPD on chronic home oxygen of 4 L, diabetes mellitus type 2, chronic kidney disease stage IIIb, glaucoma, hypertension, hyperlipidemia as well as other comorbidities who presented with a 1 week history of cough, as well as shortness of breath.  Patient was noted to have tested herself 5 days prior to admission for COVID which was positive.  Due to her worsening symptoms as well as central chest pressure and congestion patient presented to the ED.  She is admitted to the hospital for COVID-19 infection as well as an acute exacerbation of COPD.  She has completed her remdesivir course and is improving slowly.  Respiratory status is fairly stable   Assessment and Plan: No notes have been filed under this hospital service. Service: Hospitalist  COVID-19 infection Acute COPD exacerbation with Chronic respiratory failure -Patient presented with a 1 week history of cough, shortness of breath noted to be COVID-positive 5 days prior to admission on home test.  Due to worsening symptoms, wheezing, central chest pressure, congestion, productive cough patient presented to the ED. -COVID-19 PCR positive.  Influenza A and B PCR negative.  Respiratory syncytial virus PCR negative. -Patient noted to have wheezing on presentation. -Patient improving clinically. -Inflammatory Marker Trend: Recent Labs    05/04/22 0453 05/05/22 0426 05/06/22 0419  DDIMER 3.49* 3.32* 3.29*  CRP 7.5* 4.7* 2.9*    Lab Results  Component Value Date   SARSCOV2NAA POSITIVE (A) 05/03/2022   SARSCOV2NAA NEGATIVE 12/06/2019   [place] NEGATIVE 02/26/2019    SpO2: 99 % O2 Flow Rate (L/min): 3 L/min -Follow inflammatory markers daily  -Lower  extremity Dopplers positive for bilateral DVT. -Patient is 89 year old female, chronic respiratory failure on 4 L nasal cannula high risk for decompensation and as such we will complete 3-day course of IV remdesivir.   -Continue Azithromycin, Budesonide 0.5 mg Neb BID, Fluticasone 2 spray each Nare Daily, Claritin, PPI, -C/w Combivent 1 puff IH BID -C/w Zinc, vitamin C,  -IV Solu-Medrol now complete and will change to Prednisone 50 mg po Daily .   -Patient started on full dose Eliquis yesterday  -Airborne and Contact Precautions.   -C/w Supportive care.   -Will get PT/OT to evaluate and treat   Diabetes Mellitus Type 2, poorly controlled -Hemoglobin A1c 10.3. -Elevated CBG in part secondary to steroids. -Hold home regimen metformin, Victoza. -Increased Semglee to 22 units daily.   -Placed on NovoLog meal coverage 3 units 3 times daily with meals and will increase to 6 units.   -C/w Moderate Novolog SSI AC/HS -CBGs ranging from 206-406 -Diabetes coordinator following.    Hyponatremia -Likely secondary to hypovolemic hyponatremia in the setting of HCTZ. -HCTZ on hold likely will not resume on discharge. Recent Labs  Lab 05/03/22 1000 05/03/22 1702 05/03/22 2359 05/04/22 0453 05/04/22 1013 05/05/22 0426 05/06/22 0419  NA 125* 131* 126* 128* 127* 136 130*  -Improved with hydration but dropped again to 130 -Saline lock IV fluids. -Continue to Monitor and Trend and repeat CMP in the AM    AKI on CKD stage IIIa -Likely secondary to prerenal azotemia in the setting of HCTZ. -Check a UA with cultures and sensitivities check a urine sodium check a urine creatinine this has not been done yet for  unclear reasons -Abdominal ultrasound negative for hydronephrosis. -BUN/Cr Trend: Recent Labs  Lab 05/03/22 1000 05/03/22 1702 05/03/22 2359 05/04/22 0453 05/04/22 1013 05/05/22 0426 05/06/22 0419  BUN 97* 68* 86* 76* 74* 63* 62*  CREATININE 2.36* 1.23* 1.75* 1.47* 1.36* 1.10* 1.06*   -Avoid Nephrotoxic Medications, Contrast Dyes, Hypotension and Dehydration to Ensure Adequate Renal Perfusion and will need to Renally Adjust Meds -Continue to Monitor and Trend Renal Function carefully and repeat CMP in the AM   Hypertension -Stable.   -Continue to hold antihypertensive medications.  -Continue to Monitor BP per Protocol -Last BP reading was 134/63   Chronic Diastolic CHF -Stable. -Strict I's and O's and daily weights; Patient is +2.071 Liters  -Appears euvolemic -Will need an amatory home O2 screen prior to discharge as well as repeat chest x-ray in the a.m. -Continue to monitor for signs and symptoms of volume overload  Thrombocytopenia -Patient's Platelet Count Trend: Recent Labs  Lab 05/03/22 1044 05/04/22 0453 05/05/22 0426 05/06/22 0419  PLT PLATELET CLUMPS NOTED ON SMEAR, COUNT APPEARS DECREASED 138* PLATELET CLUMPS NOTED ON SMEAR, COUNT APPEARS DECREASED 88*  -Continue to Monitor for S/Sx of Bleeding; No overt bleeding noted -Repeat CBC in the AM    Abnormal LFTs/Transaminitis -LFTs trending down and LFT Trend: Recent Labs  Lab 05/03/22 1000 05/04/22 1013 05/05/22 0426 05/06/22 0419  AST 43* 26 17 14*  ALT 60* 48* 37 30  -Abdominal ultrasound with no acute abnormality, status postcholecystectomy, mild intrahepatic and extrahepatic biliary ductal dilatation similar to prior study, left renal cyst, splenic cyst which has increased in size compared to prior exam. -Outpatient follow-up.  Normocytic Anemia -Hgb/Hct Trend: Recent Labs  Lab 05/03/22 1044 05/04/22 0453 05/05/22 0426 05/06/22 0419  HGB 10.7* 11.5* 11.3* 11.7*  HCT 32.4* 34.6* 34.5* 36.7  MCV 88.0 87.6 88.5 91.1  -Continue to Monitor for S/Sx of Bleeding; No overt bleeding noted -Repeat CBC in the AM    Bilateral lower extremity DVT -Patient noted to have elevated D-dimer.  Lower extremity Dopplers obtained positive for bilateral DVT per preliminary report. -Patient received  full dose Lovenox 05/04/2022 and was transitioned to apixaban  Glaucoma -C/w Brinzolamide and Brimonidine 1 drop in Both Eyes TID as well as Netarsudil-Latanaprost 0.02-0.005% 1 drop in Both Eyes qHS  Hypoalbuminemia -Patient's Albumin Level went from 2.9 -> 1.9 -> 2.8 -> 3.1 -> 2.5 -> 2.6 -Continue to Monitor and Trend -Repeat CMP in the AM   Obesity -Complicates overall prognosis and care -Estimated body mass index is 35.19 kg/m as calculated from the following:   Height as of this encounter: '5\' 4"'$  (1.626 m).   Weight as of this encounter: 93 kg.  -Weight Loss and Dietary Counseling given  DVT prophylaxis: SCDs Start: 05/03/22 1348 apixaban (ELIQUIS) tablet 10 mg  apixaban (ELIQUIS) tablet 5 mg    Code Status: Full Code Family Communication: No family present at bedside   Disposition Plan:  Level of care: Telemetry Status is: Inpatient Remains inpatient appropriate because: Needs further clinical improvement and evaluation by PT and OT as well as an amatory home O2 screen prior to discharge   Consultants:  None  Procedures:  As delineated as above  Antimicrobials:  Anti-infectives (From admission, onward)    Start     Dose/Rate Route Frequency Ordered Stop   05/05/22 1000  remdesivir 100 mg in sodium chloride 0.9 % 100 mL IVPB  Status:  Discontinued       See Hyperspace for full Linked Orders Report.  100 mg 200 mL/hr over 30 Minutes Intravenous Daily 05/04/22 0929 05/04/22 0942   05/05/22 1000  remdesivir 100 mg in sodium chloride 0.9 % 100 mL IVPB       See Hyperspace for full Linked Orders Report.   100 mg 200 mL/hr over 30 Minutes Intravenous Daily 05/04/22 0943 05/07/22 0959   05/04/22 1200  remdesivir 200 mg in sodium chloride 0.9% 250 mL IVPB  Status:  Discontinued       See Hyperspace for full Linked Orders Report.   200 mg 580 mL/hr over 30 Minutes Intravenous Once 05/04/22 0929 05/04/22 0942   05/04/22 1200  azithromycin (ZITHROMAX) 500 mg in sodium  chloride 0.9 % 250 mL IVPB        500 mg 250 mL/hr over 60 Minutes Intravenous Every 24 hours 05/04/22 0931     05/04/22 1200  remdesivir 100 mg in sodium chloride 0.9 % 100 mL IVPB       See Hyperspace for full Linked Orders Report.   100 mg 200 mL/hr over 30 Minutes Intravenous Every 1 hr x 2 05/04/22 0943 05/04/22 1410        Subjective: Seen and examined at bedside and she is hard of hearing.  She denies any complaints and feels okay.  Denies any pain anywhere.  Does not feel short of breath.  No nausea or vomiting.  No other concerns or complaints at this time.  Objective: Vitals:   05/05/22 1938 05/05/22 2108 05/06/22 0257 05/06/22 0524  BP:  (!) 151/62 137/66 (!) 145/74  Pulse:  62 70 72  Resp:  '18 19 18  '$ Temp:  97.8 F (36.6 C) 98 F (36.7 C) 98 F (36.7 C)  TempSrc:   Oral Axillary  SpO2: 94% 97% 99% 100%  Weight:      Height:        Intake/Output Summary (Last 24 hours) at 05/06/2022 9798 Last data filed at 05/06/2022 9211 Gross per 24 hour  Intake 2090 ml  Output 876 ml  Net 1214 ml   Filed Weights   05/03/22 0918  Weight: 93 kg   Examination: Physical Exam:  Constitutional: WN/WD obese Caucasian female currently no acute distress Respiratory: Diminished to auscultation bilaterally with coarse breath sounds, no wheezing, rales, rhonchi or crackles. Normal respiratory effort and patient is not tachypenic. No accessory muscle use.  Unlabored breathing Cardiovascular: RRR, no murmurs / rubs / gallops. S1 and S2 auscultated.  Trace extremity edema Abdomen: Soft, non-tender, distended secondary to body habitus. Bowel sounds positive.  GU: Deferred. Musculoskeletal: No clubbing / cyanosis of digits/nails. Normal strength and muscle tone.  Skin: No rashes, lesions, ulcers on limited skin evaluation. No induration; Warm and dry.  Neurologic: CN 2-12 grossly intact with no focal deficits but is hard of hearing. Romberg sign and cerebellar reflexes not assessed.   Psychiatric: Normal judgment and insight. Alert and oriented x 2. Normal mood and appropriate affect.   Data Reviewed: I have personally reviewed following labs and imaging studies  CBC: Recent Labs  Lab 05/03/22 1044 05/04/22 0453 05/05/22 0426 05/06/22 0419  WBC 11.8* 5.5 9.5 10.4  NEUTROABS 9.6* 5.0 8.3* 8.4*  HGB 10.7* 11.5* 11.3* 11.7*  HCT 32.4* 34.6* 34.5* 36.7  MCV 88.0 87.6 88.5 91.1  PLT PLATELET CLUMPS NOTED ON SMEAR, COUNT APPEARS DECREASED 138* PLATELET CLUMPS NOTED ON SMEAR, COUNT APPEARS DECREASED 88*   Basic Metabolic Panel: Recent Labs  Lab 05/03/22 2359 05/04/22 0453 05/04/22 1013 05/04/22 1231 05/05/22 0426 05/06/22  0419  NA 126* 128* 127*  --  136 130*  K 5.2* 5.6* 4.8  --  5.1 4.9  CL 93* 93* 93*  --  102 100  CO2 '24 26 25  '$ --  23 24  GLUCOSE 377* 388* 415* 405* 238* 343*  BUN 86* 76* 74*  --  63* 62*  CREATININE 1.75* 1.47* 1.36*  --  1.10* 1.06*  CALCIUM 8.8* 9.3 8.9  --  8.9 8.2*  MG  --   --  1.9  --  1.5* 2.1  PHOS 3.6 4.2 4.0  --  2.7 2.8   GFR: Estimated Creatinine Clearance: 39.8 mL/min (A) (by C-G formula based on SCr of 1.06 mg/dL (H)). Liver Function Tests: Recent Labs  Lab 05/03/22 1000 05/03/22 1702 05/03/22 2359 05/04/22 0453 05/04/22 1013 05/05/22 0426 05/06/22 0419  AST 43*  --   --   --  26 17 14*  ALT 60*  --   --   --  48* 37 30  ALKPHOS 127*  --   --   --  121 94 99  BILITOT 0.8  --   --   --  0.8 0.4 0.4  PROT 6.5  --   --   --  6.7 5.6* 5.7*  ALBUMIN 2.9*   < > 2.6* 2.8* 3.1* 2.5* 2.6*   < > = values in this interval not displayed.   No results for input(s): "LIPASE", "AMYLASE" in the last 168 hours. No results for input(s): "AMMONIA" in the last 168 hours. Coagulation Profile: No results for input(s): "INR", "PROTIME" in the last 168 hours. Cardiac Enzymes: No results for input(s): "CKTOTAL", "CKMB", "CKMBINDEX", "TROPONINI" in the last 168 hours. BNP (last 3 results) No results for input(s): "PROBNP" in  the last 8760 hours. HbA1C: Recent Labs    05/03/22 1044  HGBA1C 10.3*   CBG: Recent Labs  Lab 05/04/22 2102 05/05/22 0734 05/05/22 1148 05/05/22 1608 05/05/22 2106  GLUCAP 206* 274* 359* 300* 356*   Lipid Profile: No results for input(s): "CHOL", "HDL", "LDLCALC", "TRIG", "CHOLHDL", "LDLDIRECT" in the last 72 hours. Thyroid Function Tests: No results for input(s): "TSH", "T4TOTAL", "FREET4", "T3FREE", "THYROIDAB" in the last 72 hours. Anemia Panel: No results for input(s): "VITAMINB12", "FOLATE", "FERRITIN", "TIBC", "IRON", "RETICCTPCT" in the last 72 hours. Sepsis Labs: Recent Labs  Lab 05/04/22 1013 05/05/22 0426 05/06/22 0419  PROCALCITON 0.49 0.24 <0.10    Recent Results (from the past 240 hour(s))  Resp panel by RT-PCR (RSV, Flu A&B, Covid) Anterior Nasal Swab     Status: Abnormal   Collection Time: 05/03/22 10:09 AM   Specimen: Anterior Nasal Swab  Result Value Ref Range Status   SARS Coronavirus 2 by RT PCR POSITIVE (A) NEGATIVE Final    Comment: (NOTE) SARS-CoV-2 target nucleic acids are DETECTED.  The SARS-CoV-2 RNA is generally detectable in upper respiratory specimens during the acute phase of infection. Positive results are indicative of the presence of the identified virus, but do not rule out bacterial infection or co-infection with other pathogens not detected by the test. Clinical correlation with patient history and other diagnostic information is necessary to determine patient infection status. The expected result is Negative.  Fact Sheet for Patients: EntrepreneurPulse.com.au  Fact Sheet for Healthcare Providers: IncredibleEmployment.be  This test is not yet approved or cleared by the [place] FDA and  has been authorized for detection and/or diagnosis of SARS-CoV-2 by FDA under an Emergency Use Authorization (EUA).  This EUA will remain in  effect (meaning this test can be used) for the duration of   the COVID-19 declaration under Section 564(b)(1) of the A ct, 21 U.S.C. section 360bbb-3(b)(1), unless the authorization is terminated or revoked sooner.     Influenza A by PCR NEGATIVE NEGATIVE Final   Influenza B by PCR NEGATIVE NEGATIVE Final    Comment: (NOTE) The Xpert Xpress SARS-CoV-2/FLU/RSV plus assay is intended as an aid in the diagnosis of influenza from Nasopharyngeal swab specimens and should not be used as a sole basis for treatment. Nasal washings and aspirates are unacceptable for Xpert Xpress SARS-CoV-2/FLU/RSV testing.  Fact Sheet for Patients: EntrepreneurPulse.com.au  Fact Sheet for Healthcare Providers: IncredibleEmployment.be  This test is not yet approved or cleared by the [place] FDA and has been authorized for detection and/or diagnosis of SARS-CoV-2 by FDA under an Emergency Use Authorization (EUA). This EUA will remain in effect (meaning this test can be used) for the duration of the COVID-19 declaration under Section 564(b)(1) of the Act, 21 U.S.C. section 360bbb-3(b)(1), unless the authorization is terminated or revoked.     Resp Syncytial Virus by PCR NEGATIVE NEGATIVE Final    Comment: (NOTE) Fact Sheet for Patients: EntrepreneurPulse.com.au  Fact Sheet for Healthcare Providers: IncredibleEmployment.be  This test is not yet approved or cleared by the [place] FDA and has been authorized for detection and/or diagnosis of SARS-CoV-2 by FDA under an Emergency Use Authorization (EUA). This EUA will remain in effect (meaning this test can be used) for the duration of the COVID-19 declaration under Section 564(b)(1) of the Act, 21 U.S.C. section 360bbb-3(b)(1), unless the authorization is terminated or revoked.  Performed at [facility], [place] [address], [place], [place] [address]   Respiratory (~20 pathogens) panel by PCR     Status: None    Collection Time: 05/04/22  5:41 PM   Specimen: Nasopharyngeal Swab; Respiratory  Result Value Ref Range Status   Adenovirus NOT DETECTED NOT DETECTED Final   Coronavirus 229E NOT DETECTED NOT DETECTED Final    Comment: (NOTE) The Coronavirus on the Respiratory Panel, DOES NOT test for the novel  Coronavirus (2019 nCoV)    Coronavirus HKU1 NOT DETECTED NOT DETECTED Final   Coronavirus NL63 NOT DETECTED NOT DETECTED Final   Coronavirus OC43 NOT DETECTED NOT DETECTED Final   Metapneumovirus NOT DETECTED NOT DETECTED Final   Rhinovirus / Enterovirus NOT DETECTED NOT DETECTED Final   Influenza A NOT DETECTED NOT DETECTED Final   Influenza B NOT DETECTED NOT DETECTED Final   Parainfluenza Virus 1 NOT DETECTED NOT DETECTED Final   Parainfluenza Virus 2 NOT DETECTED NOT DETECTED Final   Parainfluenza Virus 3 NOT DETECTED NOT DETECTED Final   Parainfluenza Virus 4 NOT DETECTED NOT DETECTED Final   Respiratory Syncytial Virus NOT DETECTED NOT DETECTED Final   Bordetella pertussis NOT DETECTED NOT DETECTED Final   Bordetella Parapertussis NOT DETECTED NOT DETECTED Final   Chlamydophila pneumoniae NOT DETECTED NOT DETECTED Final   Mycoplasma pneumoniae NOT DETECTED NOT DETECTED Final    Comment: Performed at [facility], [place]. [address], [place], [place] [address]    Radiology Studies: VAS [place] LOWER EXTREMITY VENOUS (DVT)  Result Date: 05/04/2022  Lower Venous DVT Study Patient Name:  [name]  Date of Exam:   05/04/2022 Medical Rec #: [id number]        Accession #:    [id number] Date of Birth: 07-24-1930        Patient Gender: F Patient Age:  89 years Exam Location:  [facility] Procedure:      VAS [place] LOWER EXTREMITY VENOUS (DVT) Referring Phys: Quillian Quince THOMPSON --------------------------------------------------------------------------------  Indications: Elevated Ddimer.  Risk Factors: COVID 19 positive. Comparison Study: No prior studies. Performing Technologist: Oliver Hum  RVT  Examination Guidelines: A complete evaluation includes B-mode imaging, spectral Doppler, color Doppler, and power Doppler as needed of all accessible portions of each vessel. Bilateral testing is considered an integral part of a complete examination. Limited examinations for reoccurring indications may be performed as noted. The reflux portion of the exam is performed with the patient in reverse Trendelenburg.  +---------+---------------+---------+-----------+----------+--------------+ RIGHT    CompressibilityPhasicitySpontaneityPropertiesThrombus Aging +---------+---------------+---------+-----------+----------+--------------+ CFV      Full           Yes      Yes                                 +---------+---------------+---------+-----------+----------+--------------+ SFJ      Full                                                        +---------+---------------+---------+-----------+----------+--------------+ FV Prox  Full                                                        +---------+---------------+---------+-----------+----------+--------------+ FV Mid   Full                                                        +---------+---------------+---------+-----------+----------+--------------+ FV DistalFull                                                        +---------+---------------+---------+-----------+----------+--------------+ PFV      Full                                                        +---------+---------------+---------+-----------+----------+--------------+ POP      Partial        Yes      Yes                  Acute          +---------+---------------+---------+-----------+----------+--------------+ PTV      Partial                                      Acute          +---------+---------------+---------+-----------+----------+--------------+ PERO     Partial  Acute           +---------+---------------+---------+-----------+----------+--------------+ Gastroc  Full                                                        +---------+---------------+---------+-----------+----------+--------------+   +---------+---------------+---------+-----------+----------+--------------+ LEFT     CompressibilityPhasicitySpontaneityPropertiesThrombus Aging +---------+---------------+---------+-----------+----------+--------------+ CFV      Full           Yes      Yes                                 +---------+---------------+---------+-----------+----------+--------------+ SFJ      Full                                                        +---------+---------------+---------+-----------+----------+--------------+ FV Prox  Full                                                        +---------+---------------+---------+-----------+----------+--------------+ FV Mid   Full                                                        +---------+---------------+---------+-----------+----------+--------------+ FV DistalFull                                                        +---------+---------------+---------+-----------+----------+--------------+ PFV      Full                                                        +---------+---------------+---------+-----------+----------+--------------+ POP      Full           Yes      Yes                                 +---------+---------------+---------+-----------+----------+--------------+ PTV      None                                         Acute          +---------+---------------+---------+-----------+----------+--------------+ PERO     None  Acute          +---------+---------------+---------+-----------+----------+--------------+     Summary: RIGHT: - Findings consistent with acute deep vein thrombosis involving the right popliteal vein, right  posterior tibial veins, and right peroneal veins. - No cystic structure found in the popliteal fossa.  LEFT: - Findings consistent with acute deep vein thrombosis involving the left posterior tibial veins, and left peroneal veins. - No cystic structure found in the popliteal fossa.  *See table(s) above for measurements and observations. Electronically signed by Monica Martinez MD on 05/04/2022 at 3:48:50 PM.    Final     Scheduled Meds:  apixaban  10 mg Oral BID   Followed by   Derrill Memo ON 05/12/2022] apixaban  5 mg Oral BID   vitamin C  500 mg Oral Daily   brinzolamide  1 drop Both Eyes TID   And   brimonidine  1 drop Both Eyes TID   budesonide (PULMICORT) nebulizer solution  0.5 mg Nebulization BID   fluticasone  2 spray Each Nare Daily   insulin aspart  0-15 Units Subcutaneous TID WC   insulin aspart  0-5 Units Subcutaneous QHS   insulin aspart  3 Units Subcutaneous TID WC   insulin glargine-yfgn  18 Units Subcutaneous Daily   Ipratropium-Albuterol  1 puff Inhalation BID   loratadine  10 mg Oral Daily   methylPREDNISolone (SOLU-MEDROL) injection  0.5 mg/kg Intravenous Q12H   Followed by   Derrill Memo ON 05/07/2022] predniSONE  50 mg Oral Daily   Netarsudil-Latanoprost  1 drop Both Eyes QHS   pantoprazole  40 mg Oral Daily   zinc sulfate  220 mg Oral Daily   Continuous Infusions:  azithromycin 500 mg (05/05/22 1211)   remdesivir 100 mg in sodium chloride 0.9 % 100 mL IVPB Stopped (05/05/22 1030)    LOS: 3 days   Raiford Noble, DO [facility] Available via Epic secure chat 7am-7pm After these hours, please refer to coverage provider listed on [email or web address] 05/06/2022, 8:42 AM

## 2022-05-06 NOTE — Evaluation (Signed)
Occupational Therapy Evaluation Patient Details Name: [name] MRN: [id number] DOB: 24-Jul-1930 Today's Date: 05/06/2022   History of Present Illness Patient is a pleasant 89 year old female with history of chronic respiratory failure secondary to COPD on chronic home O2 4 L nasal cannula, type 2 diabetes, CKD stage IIIb, glaucoma, hypertension, hyperlipidemia presented with a 1 week history of cough shortness of breath. Patient admitted for COVID-19 infection as well as an acute COPD exacerbation.   Clinical Impression   [name] is an 89 year old woman who presents on 3 L [name] at 100% oxygen saturation. On evaluation she demonstrated ability to transfer, ambulate holding onto IV pole and perform ADLs. She toileted herself. Her oxygen was removed and she performed functional tasks on RA with o2 sat maintaining between 93-94%. Patient reports she feels fine and near her baseline. From an OT standpoint patient has no needs.       Recommendations for follow up therapy are one component of a multi-disciplinary discharge planning process, led by the attending physician.  Recommendations may be updated based on patient status, additional functional criteria and insurance authorization.   Follow Up Recommendations  No OT follow up     Assistance Recommended at Discharge PRN  Patient can return home with the following Assistance with cooking/housework;Help with stairs or ramp for entrance    Functional Status Assessment  Patient has not had a recent decline in their functional status  Equipment Recommendations  None recommended by OT    Recommendations for Other Services       Precautions / Restrictions Precautions Precautions: None Restrictions Weight Bearing Restrictions: No      Mobility Bed Mobility Overal bed mobility: Needs Assistance Bed Mobility: Supine to Sit     Supine to sit: Supervision          Transfers   Equipment used: None                General transfer comment: Ambulated in room holding onto IV pole.      Balance Overall balance assessment: Mild deficits observed, not formally tested                                         ADL either performed or assessed with clinical judgement   ADL Overall ADL's : Independent                                             Vision Patient Visual Report: No change from baseline       Perception     Praxis      Pertinent Vitals/Pain Pain Assessment Pain Assessment: No/denies pain     Hand Dominance Right   Extremity/Trunk Assessment Upper Extremity Assessment Upper Extremity Assessment: Overall WFL for tasks assessed   Lower Extremity Assessment Lower Extremity Assessment: Defer to PT evaluation   Cervical / Trunk Assessment Cervical / Trunk Assessment: Normal   Communication Communication Communication: HOH   Cognition Arousal/Alertness: Awake/alert Behavior During Therapy: WFL for tasks assessed/performed Overall Cognitive Status: Within Functional Limits for tasks assessed  General Comments       Exercises     Shoulder Instructions      Home Living Family/patient expects to be discharged to:: Private residence Living Arrangements: Spouse/significant other;Children Available Help at Discharge: Family;Available 24 hours/day Type of Home: House Home Access: Ramped entrance     Home Layout: One level     Bathroom Shower/Tub: Tub/shower unit         Home Equipment: Cane - single point;Rollator (4 wheels)   Additional Comments: daughter helps her as needed. wears oxygen at night      Prior Functioning/Environment Prior Level of Function : Independent/Modified Independent             Mobility Comments: no device ADLs Comments: independent, still cooks.        OT Problem List: Cardiopulmonary status limiting activity;Decreased activity  tolerance      OT Treatment/Interventions:      OT Goals(Current goals can be found in the care plan section) Acute Rehab OT Goals OT Goal Formulation: All assessment and education complete, DC therapy  OT Frequency:      Co-evaluation              AM-PAC OT "6 Clicks" Daily Activity     Outcome Measure Help from another person eating meals?: None Help from another person taking care of personal grooming?: None Help from another person toileting, which includes using toliet, bedpan, or urinal?: None Help from another person bathing (including washing, rinsing, drying)?: None Help from another person to put on and taking off regular upper body clothing?: None Help from another person to put on and taking off regular lower body clothing?: None 6 Click Score: 24   End of Session Equipment Utilized During Treatment: Oxygen Nurse Communication: Mobility status  Activity Tolerance: Patient tolerated treatment well Patient left: in chair;with chair alarm set  OT Visit Diagnosis: Muscle weakness (generalized) (M62.81)                Time: 8882-8003 OT Time Calculation (min): 36 min Charges:  OT General Charges $OT Visit: 1 Visit OT Evaluation $OT Eval Low Complexity: 1 Low OT Treatments $Self Care/Home Management : 8-22 mins  Gustavo Lah, OTR/L [place]  Office [phone]   Lenward Chancellor 05/06/2022, 4:28 PM

## 2022-05-06 NOTE — Progress Notes (Signed)
.   Transition of Care [facility]) Screening Note   Patient Details  Name: [name] Date of Birth: [birth year]   Transition of Care [facility]) CM/SW Contact:    Illene Regulus, LCSW Phone Number: 05/06/2022, 12:36 PM    Transition of Care Department [facility]) has reviewed patient and no TOC needs have been identified at this time. We will continue to monitor patient advancement through interdisciplinary progression rounds. If new patient transition needs arise, please place a TOC consult.

## 2022-05-07 ENCOUNTER — Inpatient Hospital Stay (HOSPITAL_COMMUNITY): Payer: Medicare Other

## 2022-05-07 LAB — CBC WITH DIFFERENTIAL/PLATELET
Abs Immature Granulocytes: 0.79 10*3/uL — ABNORMAL HIGH (ref 0.00–0.07)
Basophils Absolute: 0.1 10*3/uL (ref 0.0–0.1)
Basophils Relative: 0 %
Eosinophils Absolute: 0 10*3/uL (ref 0.0–0.5)
Eosinophils Relative: 0 %
HCT: 36.9 % (ref 36.0–46.0)
Hemoglobin: 11.8 g/dL — ABNORMAL LOW (ref 12.0–15.0)
Immature Granulocytes: 7 %
Lymphocytes Relative: 5 %
Lymphs Abs: 0.5 10*3/uL — ABNORMAL LOW (ref 0.7–4.0)
MCH: 29.1 pg (ref 26.0–34.0)
MCHC: 32 g/dL (ref 30.0–36.0)
MCV: 91.1 fL (ref 80.0–100.0)
Monocytes Absolute: 0.4 10*3/uL (ref 0.1–1.0)
Monocytes Relative: 4 %
Neutro Abs: 9.4 10*3/uL — ABNORMAL HIGH (ref 1.7–7.7)
Neutrophils Relative %: 84 %
Platelets: DECREASED 10*3/uL (ref 150–400)
RBC: 4.05 MIL/uL (ref 3.87–5.11)
RDW: 13.1 % (ref 11.5–15.5)
WBC: 11.2 10*3/uL — ABNORMAL HIGH (ref 4.0–10.5)
nRBC: 0.2 % (ref 0.0–0.2)

## 2022-05-07 LAB — IRON AND TIBC
Iron: 54 ug/dL (ref 28–170)
Saturation Ratios: 25 % (ref 10.4–31.8)
TIBC: 214 ug/dL — ABNORMAL LOW (ref 250–450)
UIBC: 160 ug/dL

## 2022-05-07 LAB — RETICULOCYTES
Immature Retic Fract: 10.7 % (ref 2.3–15.9)
RBC.: 4.12 MIL/uL (ref 3.87–5.11)
Retic Count, Absolute: 91.1 10*3/uL (ref 19.0–186.0)
Retic Ct Pct: 2.2 % (ref 0.4–3.1)

## 2022-05-07 LAB — COMPREHENSIVE METABOLIC PANEL
ALT: 26 U/L (ref 0–44)
AST: 13 U/L — ABNORMAL LOW (ref 15–41)
Albumin: 2.6 g/dL — ABNORMAL LOW (ref 3.5–5.0)
Alkaline Phosphatase: 100 U/L (ref 38–126)
Anion gap: 8 (ref 5–15)
BUN: 67 mg/dL — ABNORMAL HIGH (ref 8–23)
CO2: 25 mmol/L (ref 22–32)
Calcium: 8.2 mg/dL — ABNORMAL LOW (ref 8.9–10.3)
Chloride: 99 mmol/L (ref 98–111)
Creatinine, Ser: 1.19 mg/dL — ABNORMAL HIGH (ref 0.44–1.00)
GFR, Estimated: 44 mL/min — ABNORMAL LOW (ref >60)
Glucose, Bld: 363 mg/dL — ABNORMAL HIGH (ref 70–99)
Potassium: 4.8 mmol/L (ref 3.5–5.1)
Sodium: 132 mmol/L — ABNORMAL LOW (ref 135–145)
Total Bilirubin: 0.4 mg/dL (ref 0.3–1.2)
Total Protein: 5.4 g/dL — ABNORMAL LOW (ref 6.5–8.1)

## 2022-05-07 LAB — GLUCOSE, CAPILLARY
Glucose-Capillary: 323 mg/dL — ABNORMAL HIGH (ref 70–99)
Glucose-Capillary: 403 mg/dL — ABNORMAL HIGH (ref 70–99)

## 2022-05-07 LAB — MAGNESIUM: Magnesium: 2.4 mg/dL (ref 1.7–2.4)

## 2022-05-07 LAB — FOLATE: Folate: 13.3 ng/mL (ref >5.9)

## 2022-05-07 LAB — FIBRINOGEN: Fibrinogen: 475 mg/dL (ref 210–475)

## 2022-05-07 LAB — PHOSPHORUS: Phosphorus: 2.3 mg/dL — ABNORMAL LOW (ref 2.5–4.6)

## 2022-05-07 LAB — VITAMIN B12: Vitamin B-12: 454 pg/mL (ref 180–914)

## 2022-05-07 LAB — FERRITIN: Ferritin: 92 ng/mL (ref 11–307)

## 2022-05-07 LAB — LACTATE DEHYDROGENASE: LDH: 179 U/L (ref 98–192)

## 2022-05-07 LAB — PROCALCITONIN: Procalcitonin: <0.1 ng/mL

## 2022-05-07 LAB — C-REACTIVE PROTEIN: CRP: 1.8 mg/dL — ABNORMAL HIGH (ref <1.0)

## 2022-05-07 LAB — D-DIMER, QUANTITATIVE: D-Dimer, Quant: 3.29 ug/mL-FEU — ABNORMAL HIGH (ref 0.00–0.50)

## 2022-05-07 LAB — SEDIMENTATION RATE: Sed Rate: 31 mm/hr — ABNORMAL HIGH (ref 0–22)

## 2022-05-07 MED ORDER — PANTOPRAZOLE SODIUM 40 MG PO TBEC: 40.0000 mg | DELAYED_RELEASE_TABLET | Freq: Every day | ORAL | 0 refills | Status: DC

## 2022-05-07 MED ORDER — ZINC SULFATE 220 (50 ZN) MG PO CAPS: 220.0000 mg | ORAL_CAPSULE | Freq: Every day | ORAL | 0 refills | Status: DC

## 2022-05-07 MED ORDER — ACETAMINOPHEN 325 MG PO TABS: 650.0000 mg | ORAL_TABLET | Freq: Four times a day (QID) | ORAL | 0 refills | Status: DC | PRN

## 2022-05-07 MED ORDER — PREDNISONE 10 MG (21) PO TBPK: ORAL_TABLET | ORAL | 0 refills | Status: DC

## 2022-05-07 MED ORDER — AZITHROMYCIN 500 MG PO TABS: 500.0000 mg | ORAL_TABLET | Freq: Every day | ORAL | 0 refills | Status: AC

## 2022-05-07 MED ORDER — LORATADINE 10 MG PO TABS: 10.0000 mg | ORAL_TABLET | Freq: Every day | ORAL | 0 refills | Status: DC

## 2022-05-07 MED ORDER — APIXABAN (ELIQUIS) VTE STARTER PACK (10MG AND 5MG): ORAL_TABLET | ORAL | 0 refills | Status: DC

## 2022-05-07 MED ORDER — FLUTICASONE PROPIONATE 50 MCG/ACT NA SUSP: 2.0000 | Freq: Every day | NASAL | 0 refills | Status: DC

## 2022-05-07 MED ORDER — ASCORBIC ACID 500 MG PO TABS: 500.0000 mg | ORAL_TABLET | Freq: Every day | ORAL | 0 refills | Status: DC

## 2022-05-07 NOTE — Progress Notes (Signed)
Mobility Specialist - Progress Note   05/07/22 1447  Mobility  Activity Ambulated with assistance to bathroom  Level of Assistance Contact guard assist, steadying assist  Assistive Device Other (Comment) (HHA)  Distance Ambulated (ft) 10 ft  Range of Motion/Exercises Active  Activity Response Tolerated well  Mobility Referral Yes  $Mobility charge 1 Mobility   Pt was found in bed and per daughter requested assistance to the bathroom. Afterwards was left on recliner chair with chair alarm on and RN in room.  Ferd Hibbs Mobility Specialist

## 2022-05-07 NOTE — TOC Transition Note (Signed)
Transition of Care [facility]) - CM/SW Discharge Note   Patient Details  Name: [name] MRN: [id number] Date of Birth: Jul 15, 1930  Transition of Care [facility]) CM/SW Contact:  Vassie Moselle, LCSW Phone Number: 05/07/2022, 3:18 PM   Clinical Narrative:    Margreta Journey w/ TOC contacted pt's daughter, Domingo Mend who lives with pt at home. She is agreeable to having home health services arranged and has no preference for agency use. HHPT/OT has been arranged with [facility]. HH orders are in. No further TOC needs identified at this time.    Final next level of care: Home w Home Health Services Barriers to Discharge: No Barriers Identified   Patient Goals and CMS Choice CMS Medicare.gov Compare Post Acute Care list provided to:: Patient Represenative (must comment) Choice offered to / list presented to : Adult Children  Discharge Placement                         Discharge Plan and Services Additional resources added to the After Visit Summary for                  DME Arranged: N/A DME Agency: NA       HH Arranged: PT, OT HH Agency: [place] [facility] Agency Contacted: 05/07/22 Time [place]: 1518 Representative spoke with at [place]: [place] Determinants of Health ([place]) Interventions [place]: No Food Insecurity (05/03/2022)  Housing: Low Risk  (05/03/2022)  Transportation Needs: No Transportation Needs (05/03/2022)  Utilities: Not At Risk (05/03/2022)  Tobacco Use: Low Risk  (05/03/2022)     Readmission Risk Interventions    05/07/2022    3:17 PM  Readmission Risk Prevention Plan  Transportation Screening Complete  PCP or Specialist Appt within 3-5 Days Complete  HRI or [place] Complete  Social Work Consult for [place] Planning/Counseling Complete  Palliative Care Screening Not Applicable  Medication Review [employer]) Complete

## 2022-05-07 NOTE — Evaluation (Signed)
Physical Therapy Evaluation Patient Details Name: [name] MRN: [id number] DOB: 11/21/1930 Today's Date: 05/07/2022  History of Present Illness  Patient is a pleasant 89 year old female with history of chronic respiratory failure secondary to COPD on chronic home O2 4 L nasal cannula, type 2 diabetes, CKD stage IIIb, glaucoma, hypertension, hyperlipidemia presented with a 1 week history of cough shortness of breath. Patient admitted for COVID-19 infection as well as an acute COPD exacerbation.  Clinical Impression  Pt admitted with above diagnosis.  Pt currently with functional limitations due to the deficits listed below (see PT Problem List). Pt will benefit from skilled PT to increase their independence and safety with mobility to allow discharge to the venue listed below.   Pt agreeable to mobilize and enjoyed ambulating outside of her hospital room.  Pt reports receiving great care but feels ready to d/c home.  SPO2 100% at rest on room air and 97% upon returning to room on room air (typically only wears oxygen at night at baseline).  Pt denies symptoms with mobility.           Recommendations for follow up therapy are one component of a multi-disciplinary discharge planning process, led by the attending physician.  Recommendations may be updated based on patient status, additional functional criteria and insurance authorization.  Follow Up Recommendations Home health PT      Assistance Recommended at Discharge PRN  Patient can return home with the following       Equipment Recommendations None recommended by PT  Recommendations for Other Services       Functional Status Assessment Patient has had a recent decline in their functional status and demonstrates the ability to make significant improvements in function in a reasonable and predictable amount of time.     Precautions / Restrictions Precautions Precautions: None      Mobility  Bed Mobility Overal bed mobility:  Needs Assistance Bed Mobility: Supine to Sit, Sit to Supine     Supine to sit: Supervision Sit to supine: Supervision   General bed mobility comments: SPO2 100% on room air at rest, increased time and effort to mobilize    Transfers Overall transfer level: Needs assistance Equipment used: None Transfers: Sit to/from Stand Sit to Stand: Supervision                Ambulation/Gait Ambulation/Gait assistance: Min guard, Supervision Gait Distance (Feet): 160 Feet Assistive device: Rolling walker (2 wheels) Gait Pattern/deviations: Step-through pattern, Decreased stride length       General Gait Details: provided RW per pt request, has rollator at home if needed, pt tolerated good distance and denies symptoms, able to ambulate on room air with SPO2 97% upon returning to room  Stairs            Wheelchair Mobility    Modified Rankin (Stroke Patients Only)       Balance Overall balance assessment: Mild deficits observed, not formally tested                                           Pertinent Vitals/Pain Pain Assessment Pain Assessment: No/denies pain    Home Living Family/patient expects to be discharged to:: Private residence Living Arrangements: Spouse/significant other;Children Available Help at Discharge: Family;Available 24 hours/day Type of Home: House Home Access: Ramped entrance       Home Layout: One level Home Equipment:  Cane - single point;Rollator (4 wheels) Additional Comments: daughter helps her as needed. wears oxygen at night    Prior Function Prior Level of Function : Independent/Modified Independent             Mobility Comments: no device ADLs Comments: independent, still cooks.     Hand Dominance   Dominant Hand: Right    Extremity/Trunk Assessment        Lower Extremity Assessment Lower Extremity Assessment: Overall WFL for tasks assessed       Communication   Communication: HOH  Cognition  Arousal/Alertness: Awake/alert Behavior During Therapy: WFL for tasks assessed/performed Overall Cognitive Status: Within Functional Limits for tasks assessed                                 General Comments: very HOH        General Comments      Exercises     Assessment/Plan    PT Assessment Patient needs continued PT services  PT Problem List Decreased strength;Decreased activity tolerance;Decreased balance;Decreased mobility;Decreased knowledge of use of DME       PT Treatment Interventions Gait training;DME instruction;Therapeutic exercise;Balance training;Functional mobility training;Therapeutic activities;Patient/family education;Stair training    PT Goals (Current goals can be found in the Care Plan section)  Acute Rehab PT Goals PT Goal Formulation: With patient Time For Goal Achievement: 05/21/22 Potential to Achieve Goals: Good    Frequency Min 3X/week     Co-evaluation               AM-PAC PT "6 Clicks" Mobility  Outcome Measure Help needed turning from your back to your side while in a flat bed without using bedrails?: None Help needed moving from lying on your back to sitting on the side of a flat bed without using bedrails?: None Help needed moving to and from a bed to a chair (including a wheelchair)?: A Little Help needed standing up from a chair using your arms (e.g., wheelchair or bedside chair)?: A Little Help needed to walk in hospital room?: A Little Help needed climbing 3-5 steps with a railing? : A Little 6 Click Score: 20    End of Session Equipment Utilized During Treatment: Gait belt Activity Tolerance: Patient tolerated treatment well Patient left: in bed;with call bell/phone within reach Nurse Communication: Mobility status PT Visit Diagnosis: Difficulty in walking, not elsewhere classified (R26.2)    Time: 5784-6962 PT Time Calculation (min) (ACUTE ONLY): 26 min   Charges:   PT Evaluation $PT Eval Low  Complexity: 1 Low PT Treatments $Gait Training: 8-22 mins      Jannette Spanner PT, DPT Physical Therapist Acute Rehabilitation Services Preferred contact method: Secure Chat Weekend Pager Only: [phone] Office: [phone]   [name] 05/07/2022, 11:31 AM

## 2022-05-07 NOTE — Discharge Summary (Signed)
Physician Discharge Summary   Patient: [name] MRN: [id number] DOB: 29-Apr-1930  Admit date:     05/03/2022  Discharge date: 05/07/2022  Discharge Physician: Raiford Noble, DO   PCP: [name], DO   Recommendations at discharge:   Follow-up with PCP within 1 to 2 weeks and repeat CBC, CMP, mag, Phos within 1 week Continue with home health services Repeat chest x-ray in 3 to 6 weeks  Discharge Diagnoses: Principal Problem:   COVID-19 virus infection Active Problems:   COPD exacerbation ([place])   AKI (acute kidney injury) ([place])   Chronic diastolic CHF (congestive heart failure) (HCC)   Diabetes mellitus ([place])   CKD (chronic kidney disease) stage 3, GFR 30-59 ml/min (HCC)   Essential hypertension   Chronic obstructive pulmonary disease, unspecified (HCC)   Hyponatremia   Elevated LFTs   Chronic hypoxic respiratory failure ([place])  Resolved Problems:   * No resolved hospital problems. [facility] Course: The patient is a pleasant 89 year old Caucasian female with a past medical history significant for but not limited to chronic respiratory failure secondary to COPD on chronic home oxygen of 4 L, diabetes mellitus type 2, chronic kidney disease stage IIIb, glaucoma, hypertension, hyperlipidemia as well as other comorbidities who presented with a 1 week history of cough, as well as shortness of breath. Patient was noted to have tested herself 5 days prior to admission for COVID which was positive. Due to her worsening symptoms as well as central chest pressure and congestion patient presented to the ED. She is admitted to the hospital for COVID-19 infection as well as an acute exacerbation of COPD. She has completed her remdesivir course and is improving slowly. Respiratory status is fairly stable   Assessment and Plan:  COVID-19 infection Acute COPD exacerbation with Chronic respiratory failure -Patient presented with a 1 week history of cough, shortness of breath noted to be  COVID-positive 5 days prior to admission on home test.  Due to worsening symptoms, wheezing, central chest pressure, congestion, productive cough patient presented to the ED. -COVID-19 PCR positive.  Influenza A and B PCR negative.  Respiratory syncytial virus PCR negative. -Patient noted to have wheezing on presentation. -Patient improving clinically. -Inflammatory Marker Trend: Recent Labs (last 2 labs)       Recent Labs    05/04/22 0453 05/05/22 0426 05/06/22 0419  DDIMER 3.49* 3.32* 3.29*  CRP 7.5* 4.7* 2.9*     -CRP at the time of discharge is 1.8 and procalcitonin level was less than 0.10 x 2   Recent Labs       Lab Results  Component Value Date    SARSCOV2NAA POSITIVE (A) 05/03/2022    [place] NEGATIVE 12/06/2019    [place] NEGATIVE 02/26/2019      SpO2: 99 % O2 Flow Rate (L/min): 3 L/min -Follow inflammatory markers daily  -Lower extremity Dopplers positive for bilateral DVT. -Patient is 89 year old female, chronic respiratory failure on 4 L nasal cannula high risk for decompensation and as such we will complete 3-day course of IV remdesivir.   -Continue Azithromycin, Budesonide 0.5 mg Neb BID, Fluticasone 2 spray each Nare Daily, Claritin, PPI, -C/w Combivent 1 puff IH BID -C/w Zinc, vitamin C,  -IV Solu-Medrol now complete and will change to Prednisone 50 mg po Daily and Sterapred for discharge-Patient started on full dose Eliquis yesterday  -Airborne and Contact Precautions.   -C/w Supportive care.   -Will get PT/OT to evaluate and treat and recommending home health   Diabetes Mellitus  Type 2, poorly controlled -Hemoglobin A1c 10.3. -Elevated CBG in part secondary to steroids. -Hold home regimen metformin, Victoza. -Increased Semglee to 22 units daily.   -Placed on NovoLog meal coverage 3 units 3 times daily with meals and will increase to 6 units.   -C/w Moderate Novolog SSI AC/HS -CBGs ranging from 323-450 -Diabetes coordinator following.    Leukocytosis -In the setting of Steroid Demargination -WBC Trend: Recent Labs  Lab 05/03/22 1044 05/04/22 0453 05/05/22 0426 05/06/22 0419 05/07/22 0444  WBC 11.8* 5.5 9.5 10.4 11.2*  -Repeat CBC within 1 week   Hyponatremia -Likely secondary to hypovolemic hyponatremia in the setting of HCTZ. -HCTZ on hold likely will not resume on discharge. Recent Labs  Lab 05/03/22 1702 05/03/22 2359 05/04/22 0453 05/04/22 1013 05/05/22 0426 05/06/22 0419 05/07/22 0444  NA 131* 126* 128* 127* 136 130* 132*  -Improved with hydration but dropped again to 130 proved back to 132 -Saline lock IV fluids. -Continue to Monitor and Trend and repeat CMP in the AM    AKI on CKD stage IIIa -Likely secondary to prerenal azotemia in the setting of HCTZ. -Check a UA with cultures and sensitivities check a urine sodium check a urine creatinine this has not been done yet for unclear reasons -Abdominal ultrasound negative for hydronephrosis. -BUN/Cr Trend: Recent Labs  Lab 05/03/22 1702 05/03/22 2359 05/04/22 0453 05/04/22 1013 05/05/22 0426 05/06/22 0419 05/07/22 0444  BUN 68* 86* 76* 74* 63* 62* 67*  CREATININE 1.23* 1.75* 1.47* 1.36* 1.10* 1.06* 1.19*  -Avoid Nephrotoxic Medications, Contrast Dyes, Hypotension and Dehydration to Ensure Adequate Renal Perfusion and will need to Renally Adjust Meds -Continue to Monitor and Trend Renal Function carefully and repeat CMP in 1 week   Hypertension -Stable.   -Continue to hold antihypertensive medications.  -Continue to Monitor BP per Protocol -Last BP reading was 134/63   Chronic Diastolic CHF -Stable. -Strict I's and O's and daily weights; Patient is +2.071 Liters  -Appears euvolemic -Patient is stable on her home oxygen and can be discharged -Continue to monitor for signs and symptoms of volume overload   Thrombocytopenia -Patient's Platelet Count Trend: Recent Labs  Lab 05/03/22 1044 05/04/22 0453 05/05/22 0426 05/06/22 0419  05/07/22 0444  PLT PLATELET CLUMPS NOTED ON SMEAR, COUNT APPEARS DECREASED 138* PLATELET CLUMPS NOTED ON SMEAR, COUNT APPEARS DECREASED 88* PLATELET CLUMPS NOTED ON SMEAR, COUNT APPEARS DECREASED  -Continue to Monitor for S/Sx of Bleeding; No overt bleeding noted -Repeat CBC in the AM    Abnormal LFTs/Transaminitis -LFTs trending down and LFT Trend: Recent Labs  Lab 05/03/22 1000 05/04/22 1013 05/05/22 0426 05/06/22 0419 05/07/22 0444  AST 43* 26 17 14* 13*  ALT 60* 48* 37 30 26  Abdominal ultrasound with no acute abnormality, status postcholecystectomy, mild intrahepatic and extrahepatic biliary ductal dilatation similar to prior study, left renal cyst, splenic cyst which has increased in size compared to prior exam. -Outpatient follow-up.   Normocytic Anemia -Hgb/Hct Trend: Recent Labs  Lab 05/03/22 1044 05/04/22 0453 05/05/22 0426 05/06/22 0419 05/07/22 0444  HGB 10.7* 11.5* 11.3* 11.7* 11.8*  HCT 32.4* 34.6* 34.5* 36.7 36.9  MCV 88.0 87.6 88.5 91.1 91.1  -Anemia panel was checked and showed an iron level of 54, UIBC of 160, TIBC of 214, saturation ratios of 25%, ferritin of 92, folate level 13.3 and vitamin B12 level 454 -Continue to Monitor for S/Sx of Bleeding; No overt bleeding noted -Repeat CBC in the AM    Bilateral Lower Extremity DVT -Patient noted  to have elevated D-dimer.  Lower extremity Dopplers obtained positive for bilateral DVT per preliminary report. -Patient received full dose Lovenox 05/04/2022 and was transitioned to Apixaban and will continue outpatient    Glaucoma -C/w Brinzolamide and Brimonidine 1 drop in Both Eyes TID as well as Netarsudil-Latanaprost 0.02-0.005% 1 drop in Both Eyes qHS   Hypoalbuminemia -Patient's Albumin Level went from 2.9 -> 1.9 -> 2.8 -> 3.1 -> 2.5 -> 2.6 -> 2.6 -Continue to Monitor and Trend -Repeat CMP in the AM    Obesity -Complicates overall prognosis and care -Estimated body mass index is 35.19 kg/m as calculated  from the following:   Height as of this encounter: '5\' 4"'$  (1.626 m).   Weight as of this encounter: 93 kg.  -Weight Loss and Dietary Counseling given  Consultants: None Procedures performed: As delineated as above  Disposition: Home Diet recommendation:  Discharge Diet Orders (From admission, onward)     Start     Ordered   05/07/22 0000  Diet - low sodium heart healthy        05/07/22 1326   05/07/22 0000  Diet Carb Modified        05/07/22 1326           Cardiac and Carb modified diet DISCHARGE MEDICATION: Allergies as of 05/07/2022       Reactions   Cephalexin Anaphylaxis, Swelling, Other (See Comments)   Micardis [telmisartan] Other (See Comments)   Hyperkalemia during hospitalization with pulmonary edema   Atorvastatin Other (See Comments)   Other reaction(s): myalgia   Buspirone Other (See Comments)   Other reaction(s): "couldn't breath"   Canagliflozin Other (See Comments)   Pt does not recall   Doxycycline Hyclate Other (See Comments)   Other reaction(s): nausea   Lipitor [atorvastatin Calcium] Other (See Comments)   myalgia   Tramadol Nausea Only   Tramadol Hcl Other (See Comments)        Medication List     STOP taking these medications    hydrochlorothiazide 25 MG tablet Commonly known as: HYDRODIURIL   predniSONE 20 MG tablet Commonly known as: DELTASONE Replaced by: predniSONE 10 MG (21) Tbpk tablet       TAKE these medications    acetaminophen 325 MG tablet Commonly known as: TYLENOL Take 2 tablets (650 mg total) by mouth every 6 (six) hours as needed for mild pain (or Fever >/= 101).   amLODipine 10 MG tablet Commonly known as: NORVASC TAKE 1 TABLET(10 MG) BY MOUTH DAILY What changed:  how much to take how to take this when to take this additional instructions   Apixaban Starter Pack ('10mg'$  and '5mg'$ ) Commonly known as: ELIQUIS STARTER PACK Take as directed on package: start with two-'5mg'$  tablets twice daily for 7 days. On day 8,  switch to one-'5mg'$  tablet twice daily.   ascorbic acid 500 MG tablet Commonly known as: VITAMIN C Take 1 tablet (500 mg total) by mouth daily. Start taking on: May 08, 2022   azithromycin 500 MG tablet Commonly known as: ZITHROMAX Take 1 tablet (500 mg total) by mouth daily for 3 days.   fluticasone 50 MCG/ACT nasal spray Commonly known as: FLONASE Place 2 sprays into both nostrils daily. Start taking on: May 08, 2022   guaiFENesin-dextromethorphan 100-10 MG/5ML syrup Commonly known as: ROBITUSSIN DM Take 5 mLs by mouth every 4 (four) hours as needed for cough.   ipratropium-albuterol 0.5-2.5 (3) MG/3ML Soln Commonly known as: DUONEB Take 3 mLs by nebulization every 4 (four)  hours as needed.   isosorbide mononitrate 60 MG 24 hr tablet Commonly known as: IMDUR TAKE 1 TABLET DAILY   liraglutide 18 MG/3ML Sopn Commonly known as: VICTOZA Inject 0.6 mg into the skin daily.   loratadine 10 MG tablet Commonly known as: CLARITIN Take 1 tablet (10 mg total) by mouth daily. Start taking on: May 08, 2022   metFORMIN 1000 MG tablet Commonly known as: GLUCOPHAGE Take 0.5 tablets (500 mg total) by mouth daily.   pantoprazole 40 MG tablet Commonly known as: PROTONIX Take 1 tablet (40 mg total) by mouth daily. Start taking on: May 08, 2022   predniSONE 10 MG (21) Tbpk tablet Commonly known as: STERAPRED UNI-PAK 21 TAB Take 6 tablets day 1, 5 tablets on day 2, 4 tablets on day 3, 3 tablets on day 4, 2 tabs on day 5, 1 tablet on day 6 and then stop on day 7 Replaces: predniSONE 20 MG tablet   Rocklatan 0.02-0.005 % Soln Generic drug: Netarsudil-Latanoprost Place 1 drop into both eyes at bedtime.   Simbrinza 1-0.2 % Susp Generic drug: Brinzolamide-Brimonidine Apply 2 drops to eye daily.   zinc sulfate 220 (50 Zn) MG capsule Take 1 capsule (220 mg total) by mouth daily. Start taking on: May 08, 2022       Discharge Exam: Danley Danker Weights   05/03/22 8119   Weight: 93 kg   Vitals:   05/07/22 1100 05/07/22 1331  BP:  (!) 155/62  Pulse:  69  Resp:  (!) 22  Temp:  97.8 F (36.6 C)  SpO2: 100% 100%   Examination: Physical Exam:  Constitutional: WN/WD obese Caucasian female currently no acute distress Respiratory: Diminished to auscultation bilaterally, no wheezing, rales, rhonchi or crackles. Normal respiratory effort and patient is not tachypenic. No accessory muscle use.  Unlabored breathing Cardiovascular: RRR, no murmurs / rubs / gallops. S1 and S2 auscultated. No extremity edema. Abdomen: Soft, non-tender, distended secondary to body habitus bowel sounds positive.  GU: Deferred. Musculoskeletal: No clubbing / cyanosis of digits/nails. No joint deformity upper and lower extremities.  Skin: No rashes, lesions, ulcers on limited skin evaluation. No induration; Warm and dry.  Neurologic: CN 2-12 grossly intact with no focal deficits. Romberg sign and cerebellar reflexes not assessed.  Psychiatric: Normal judgment and insight. Alert and oriented x 3. Normal mood and appropriate affect.   Condition at discharge: stable  The results of significant diagnostics from this hospitalization (including imaging, microbiology, ancillary and laboratory) are listed below for reference.   Imaging Studies: DG CHEST PORT 1 VIEW  Result Date: 05/07/2022 CLINICAL DATA:  Shortness of breath.  COVID infection. EXAM: PORTABLE CHEST 1 VIEW COMPARISON:  05/03/2022 FINDINGS: Right greater than left hilar prominence shown on prior CT exam to be primarily vascular in nature. Atherosclerotic calcification of the aortic arch. Heart size within normal limits for AP projection. Subsegmental atelectasis or scarring at the left lung base. The lungs appear otherwise clear. IMPRESSION: 1. Subsegmental atelectasis or scarring at the left lung base. 2. Right greater than left hilar prominence shown on prior CT exam to be primarily vascular in nature. 3.  Aortic  Atherosclerosis (ICD10-I70.0). Electronically Signed   By: Van Clines M.D.   On: 05/07/2022 07:35   VAS [place] LOWER EXTREMITY VENOUS (DVT)  Result Date: 05/04/2022  Lower Venous DVT Study Patient Name:  KIAJA SHORTY  Date of Exam:   05/04/2022 Medical Rec #: [id number]        Accession #:  [id number] Date of Birth: 1930/06/23        Patient Gender: F Patient Age:   89 years Exam Location:  [facility] Procedure:      VAS [place] LOWER EXTREMITY VENOUS (DVT) Referring Phys: Quillian Quince THOMPSON --------------------------------------------------------------------------------  Indications: Elevated Ddimer.  Risk Factors: COVID 19 positive. Comparison Study: No prior studies. Performing Technologist: Oliver Hum RVT  Examination Guidelines: A complete evaluation includes B-mode imaging, spectral Doppler, color Doppler, and power Doppler as needed of all accessible portions of each vessel. Bilateral testing is considered an integral part of a complete examination. Limited examinations for reoccurring indications may be performed as noted. The reflux portion of the exam is performed with the patient in reverse Trendelenburg.  +---------+---------------+---------+-----------+----------+--------------+ RIGHT    CompressibilityPhasicitySpontaneityPropertiesThrombus Aging +---------+---------------+---------+-----------+----------+--------------+ CFV      Full           Yes      Yes                                 +---------+---------------+---------+-----------+----------+--------------+ SFJ      Full                                                        +---------+---------------+---------+-----------+----------+--------------+ FV Prox  Full                                                        +---------+---------------+---------+-----------+----------+--------------+ FV Mid   Full                                                         +---------+---------------+---------+-----------+----------+--------------+ FV DistalFull                                                        +---------+---------------+---------+-----------+----------+--------------+ PFV      Full                                                        +---------+---------------+---------+-----------+----------+--------------+ POP      Partial        Yes      Yes                  Acute          +---------+---------------+---------+-----------+----------+--------------+ PTV      Partial                                      Acute          +---------+---------------+---------+-----------+----------+--------------+  PERO     Partial                                      Acute          +---------+---------------+---------+-----------+----------+--------------+ Gastroc  Full                                                        +---------+---------------+---------+-----------+----------+--------------+   +---------+---------------+---------+-----------+----------+--------------+ LEFT     CompressibilityPhasicitySpontaneityPropertiesThrombus Aging +---------+---------------+---------+-----------+----------+--------------+ CFV      Full           Yes      Yes                                 +---------+---------------+---------+-----------+----------+--------------+ SFJ      Full                                                        +---------+---------------+---------+-----------+----------+--------------+ FV Prox  Full                                                        +---------+---------------+---------+-----------+----------+--------------+ FV Mid   Full                                                        +---------+---------------+---------+-----------+----------+--------------+ FV DistalFull                                                         +---------+---------------+---------+-----------+----------+--------------+ PFV      Full                                                        +---------+---------------+---------+-----------+----------+--------------+ POP      Full           Yes      Yes                                 +---------+---------------+---------+-----------+----------+--------------+ PTV      None                                         Acute          +---------+---------------+---------+-----------+----------+--------------+  PERO     None                                         Acute          +---------+---------------+---------+-----------+----------+--------------+     Summary: RIGHT: - Findings consistent with acute deep vein thrombosis involving the right popliteal vein, right posterior tibial veins, and right peroneal veins. - No cystic structure found in the popliteal fossa.  LEFT: - Findings consistent with acute deep vein thrombosis involving the left posterior tibial veins, and left peroneal veins. - No cystic structure found in the popliteal fossa.  *See table(s) above for measurements and observations. Electronically signed by Monica Martinez MD on 05/04/2022 at 3:48:50 PM.    Final    US Abdomen Complete  Result Date: 05/03/2022 CLINICAL DATA:  Acute kidney injury, elevated LFTs. EXAM: ABDOMEN ULTRASOUND COMPLETE COMPARISON:  CT abdomen and pelvis 11/14/2010. CT angiogram chest 12/06/2019. FINDINGS: Gallbladder: Gallbladder is surgically absent. Common bile duct: Diameter: 10 mm, similar to the prior study. There is mild intrahepatic biliary ductal dilatation. Liver: No focal lesion identified. Within normal limits in parenchymal echogenicity. Portal vein is patent on color Doppler imaging with normal direction of blood flow towards the liver. IVC: No abnormality visualized. Pancreas: Limited evaluation secondary to overlying bowel gas and body habitus. Spleen: Normal in size. There is a 3.4 x  2.8 x 2.4 cm cyst in the spleen which has increased in size compared to prior exam. Right Kidney: Length: 9.3 cm. Echogenicity within normal limits. No mass or hydronephrosis visualized. Left Kidney: Length: 9.7 cm. Echogenicity within normal limits. No hydronephrosis. There are 2 cysts identified in the left kidney. One measures 2.0 x 1.2 x 2.2 cm. The other measures 2.1 x 2.3 x 2.6 cm. Abdominal aorta: No aneurysm visualized. Visualized portion measures up to 2.4 cm. Distal abdominal aorta and bifurcation not visualized secondary to overlying bowel gas. Other findings: None. IMPRESSION: 1. No acute abnormality. 2. Status post cholecystectomy. 3. Mild intrahepatic and extrahepatic biliary ductal dilatation, similar to the prior study. 4. Left renal cysts. 5. Splenic cyst which has increased in size compared to prior exam. Electronically Signed   By: Ronney Asters M.D.   On: 05/03/2022 15:27   DG Chest Port 1 View  Result Date: 05/03/2022 CLINICAL DATA:  Shortness of breath with productive cough for 1 week. Recently diagnosed with COVID. EXAM: PORTABLE CHEST 1 VIEW COMPARISON:  Radiographs 06/16/2021 and 12/08/2020.  CT 12/06/2019. FINDINGS: 0953 hours. The heart size and mediastinal contours are stable with aortic atherosclerosis. There is stable mild atelectasis or scarring at the left lung base. No new airspace disease, edema, significant pleural effusion or pneumothorax. The bones appear unchanged with mild thoracic spondylosis and postsurgical changes at the right shoulder. No acute osseous findings are demonstrated. Telemetry leads overlie the chest. IMPRESSION: No evidence of acute cardiopulmonary process. Stable mild left basilar atelectasis or scarring. Electronically Signed   By: Richardean Sale M.D.   On: 05/03/2022 10:37    Microbiology: Results for orders placed or performed during the hospital encounter of 05/03/22  Resp panel by RT-PCR (RSV, Flu A&B, Covid) Anterior Nasal Swab     Status:  Abnormal   Collection Time: 05/03/22 10:09 AM   Specimen: Anterior Nasal Swab  Result Value Ref Range Status   SARS Coronavirus 2 by RT PCR POSITIVE (A) NEGATIVE Final  Comment: (NOTE) SARS-CoV-2 target nucleic acids are DETECTED.  The SARS-CoV-2 RNA is generally detectable in upper respiratory specimens during the acute phase of infection. Positive results are indicative of the presence of the identified virus, but do not rule out bacterial infection or co-infection with other pathogens not detected by the test. Clinical correlation with patient history and other diagnostic information is necessary to determine patient infection status. The expected result is Negative.  Fact Sheet for Patients: EntrepreneurPulse.com.au  Fact Sheet for Healthcare Providers: IncredibleEmployment.be  This test is not yet approved or cleared by the [place] FDA and  has been authorized for detection and/or diagnosis of SARS-CoV-2 by FDA under an Emergency Use Authorization (EUA).  This EUA will remain in effect (meaning this test can be used) for the duration of  the COVID-19 declaration under Section 564(b)(1) of the A ct, 21 U.S.C. section 360bbb-3(b)(1), unless the authorization is terminated or revoked sooner.     Influenza A by PCR NEGATIVE NEGATIVE Final   Influenza B by PCR NEGATIVE NEGATIVE Final    Comment: (NOTE) The Xpert Xpress SARS-CoV-2/FLU/RSV plus assay is intended as an aid in the diagnosis of influenza from Nasopharyngeal swab specimens and should not be used as a sole basis for treatment. Nasal washings and aspirates are unacceptable for Xpert Xpress SARS-CoV-2/FLU/RSV testing.  Fact Sheet for Patients: EntrepreneurPulse.com.au  Fact Sheet for Healthcare Providers: IncredibleEmployment.be  This test is not yet approved or cleared by the [place] FDA and has been authorized for detection  and/or diagnosis of SARS-CoV-2 by FDA under an Emergency Use Authorization (EUA). This EUA will remain in effect (meaning this test can be used) for the duration of the COVID-19 declaration under Section 564(b)(1) of the Act, 21 U.S.C. section 360bbb-3(b)(1), unless the authorization is terminated or revoked.     Resp Syncytial Virus by PCR NEGATIVE NEGATIVE Final    Comment: (NOTE) Fact Sheet for Patients: EntrepreneurPulse.com.au  Fact Sheet for Healthcare Providers: IncredibleEmployment.be  This test is not yet approved or cleared by the [place] FDA and has been authorized for detection and/or diagnosis of SARS-CoV-2 by FDA under an Emergency Use Authorization (EUA). This EUA will remain in effect (meaning this test can be used) for the duration of the COVID-19 declaration under Section 564(b)(1) of the Act, 21 U.S.C. section 360bbb-3(b)(1), unless the authorization is terminated or revoked.  Performed at [facility], [place] [address] [place], [place] [address]   Respiratory (~20 pathogens) panel by PCR     Status: None   Collection Time: 05/04/22  5:41 PM   Specimen: Nasopharyngeal Swab; Respiratory  Result Value Ref Range Status   Adenovirus NOT DETECTED NOT DETECTED Final   Coronavirus 229E NOT DETECTED NOT DETECTED Final    Comment: (NOTE) The Coronavirus on the Respiratory Panel, DOES NOT test for the novel  Coronavirus (2019 nCoV)    Coronavirus HKU1 NOT DETECTED NOT DETECTED Final   Coronavirus NL63 NOT DETECTED NOT DETECTED Final   Coronavirus OC43 NOT DETECTED NOT DETECTED Final   Metapneumovirus NOT DETECTED NOT DETECTED Final   Rhinovirus / Enterovirus NOT DETECTED NOT DETECTED Final   Influenza A NOT DETECTED NOT DETECTED Final   Influenza B NOT DETECTED NOT DETECTED Final   Parainfluenza Virus 1 NOT DETECTED NOT DETECTED Final   Parainfluenza Virus 2 NOT DETECTED NOT DETECTED Final    Parainfluenza Virus 3 NOT DETECTED NOT DETECTED Final   Parainfluenza Virus 4 NOT DETECTED NOT DETECTED Final   Respiratory  Syncytial Virus NOT DETECTED NOT DETECTED Final   Bordetella pertussis NOT DETECTED NOT DETECTED Final   Bordetella Parapertussis NOT DETECTED NOT DETECTED Final   Chlamydophila pneumoniae NOT DETECTED NOT DETECTED Final   Mycoplasma pneumoniae NOT DETECTED NOT DETECTED Final    Comment: Performed at [facility], [place] [address], [place], [place] [address]   Labs: CBC: Recent Labs  Lab 05/03/22 1044 05/04/22 0453 05/05/22 0426 05/06/22 0419 05/07/22 0444  WBC 11.8* 5.5 9.5 10.4 11.2*  NEUTROABS 9.6* 5.0 8.3* 8.4* 9.4*  HGB 10.7* 11.5* 11.3* 11.7* 11.8*  HCT 32.4* 34.6* 34.5* 36.7 36.9  MCV 88.0 87.6 88.5 91.1 91.1  PLT PLATELET CLUMPS NOTED ON SMEAR, COUNT APPEARS DECREASED 138* PLATELET CLUMPS NOTED ON SMEAR, COUNT APPEARS DECREASED 88* PLATELET CLUMPS NOTED ON SMEAR, COUNT APPEARS DECREASED   Basic Metabolic Panel: Recent Labs  Lab 05/04/22 0453 05/04/22 1013 05/04/22 1231 05/05/22 0426 05/06/22 0419 05/07/22 0444  NA 128* 127*  --  136 130* 132*  K 5.6* 4.8  --  5.1 4.9 4.8  CL 93* 93*  --  102 100 99  CO2 26 25  --  '23 24 25  '$ GLUCOSE 388* 415* 405* 238* 343* 363*  BUN 76* 74*  --  63* 62* 67*  CREATININE 1.47* 1.36*  --  1.10* 1.06* 1.19*  CALCIUM 9.3 8.9  --  8.9 8.2* 8.2*  MG  --  1.9  --  1.5* 2.1 2.4  PHOS 4.2 4.0  --  2.7 2.8 2.3*   Liver Function Tests: Recent Labs  Lab 05/03/22 1000 05/03/22 1702 05/04/22 0453 05/04/22 1013 05/05/22 0426 05/06/22 0419 05/07/22 0444  AST 43*  --   --  26 17 14* 13*  ALT 60*  --   --  48* 37 30 26  ALKPHOS 127*  --   --  121 94 99 100  BILITOT 0.8  --   --  0.8 0.4 0.4 0.4  PROT 6.5  --   --  6.7 5.6* 5.7* 5.4*  ALBUMIN 2.9*   < > 2.8* 3.1* 2.5* 2.6* 2.6*   < > = values in this interval not displayed.   CBG: Recent Labs  Lab 05/06/22 1145 05/06/22 1733 05/06/22 2123  05/07/22 0752 05/07/22 1202  GLUCAP 406* 450* 440* 323* 403*   Discharge time spent: greater than 30 minutes.  Signed: Raiford Noble, DO [facility] 05/07/2022

## 2022-05-11 DIAGNOSIS — Z8719 Personal history of other diseases of the digestive system: Secondary | ICD-10-CM | POA: Diagnosis not present

## 2022-05-11 DIAGNOSIS — H409 Unspecified glaucoma: Secondary | ICD-10-CM | POA: Diagnosis not present

## 2022-05-11 DIAGNOSIS — E1122 Type 2 diabetes mellitus with diabetic chronic kidney disease: Secondary | ICD-10-CM | POA: Diagnosis not present

## 2022-05-11 DIAGNOSIS — J449 Chronic obstructive pulmonary disease, unspecified: Secondary | ICD-10-CM | POA: Diagnosis not present

## 2022-05-11 DIAGNOSIS — K589 Irritable bowel syndrome without diarrhea: Secondary | ICD-10-CM | POA: Diagnosis not present

## 2022-05-11 DIAGNOSIS — Z7985 Long-term (current) use of injectable non-insulin antidiabetic drugs: Secondary | ICD-10-CM | POA: Diagnosis not present

## 2022-05-11 DIAGNOSIS — I130 Hypertensive heart and chronic kidney disease with heart failure and stage 1 through stage 4 chronic kidney disease, or unspecified chronic kidney disease: Secondary | ICD-10-CM | POA: Diagnosis not present

## 2022-05-11 DIAGNOSIS — I509 Heart failure, unspecified: Secondary | ICD-10-CM | POA: Diagnosis not present

## 2022-05-11 DIAGNOSIS — Z9049 Acquired absence of other specified parts of digestive tract: Secondary | ICD-10-CM | POA: Diagnosis not present

## 2022-05-11 DIAGNOSIS — N179 Acute kidney failure, unspecified: Secondary | ICD-10-CM | POA: Diagnosis not present

## 2022-05-11 DIAGNOSIS — U071 COVID-19: Secondary | ICD-10-CM | POA: Diagnosis not present

## 2022-05-11 DIAGNOSIS — Z9981 Dependence on supplemental oxygen: Secondary | ICD-10-CM | POA: Diagnosis not present

## 2022-05-11 DIAGNOSIS — N1832 Chronic kidney disease, stage 3b: Secondary | ICD-10-CM | POA: Diagnosis not present

## 2022-05-11 DIAGNOSIS — Z9181 History of falling: Secondary | ICD-10-CM | POA: Diagnosis not present

## 2022-05-11 DIAGNOSIS — E871 Hypo-osmolality and hyponatremia: Secondary | ICD-10-CM | POA: Diagnosis not present

## 2022-05-11 DIAGNOSIS — Z7984 Long term (current) use of oral hypoglycemic drugs: Secondary | ICD-10-CM | POA: Diagnosis not present

## 2022-05-11 DIAGNOSIS — E785 Hyperlipidemia, unspecified: Secondary | ICD-10-CM | POA: Diagnosis not present

## 2022-05-11 DIAGNOSIS — Z7901 Long term (current) use of anticoagulants: Secondary | ICD-10-CM | POA: Diagnosis not present

## 2022-05-11 DIAGNOSIS — J9611 Chronic respiratory failure with hypoxia: Secondary | ICD-10-CM | POA: Diagnosis not present

## 2022-05-13 ENCOUNTER — Other Ambulatory Visit: Payer: Self-pay

## 2022-05-13 ENCOUNTER — Emergency Department (HOSPITAL_COMMUNITY): Payer: Medicare Other

## 2022-05-13 ENCOUNTER — Encounter (HOSPITAL_COMMUNITY): Payer: Self-pay

## 2022-05-13 ENCOUNTER — Inpatient Hospital Stay (HOSPITAL_COMMUNITY)
Admission: EM | Admit: 2022-05-13 | Discharge: 2022-05-22 | DRG: 637 | Disposition: A | Discharge status: Skilled Nursing Facility | Payer: Medicare Other | Source: Ambulatory Visit | Attending: Internal Medicine | Admitting: Internal Medicine

## 2022-05-13 DIAGNOSIS — Z9981 Dependence on supplemental oxygen: Secondary | ICD-10-CM | POA: Diagnosis not present

## 2022-05-13 DIAGNOSIS — Z7401 Bed confinement status: Secondary | ICD-10-CM | POA: Diagnosis not present

## 2022-05-13 DIAGNOSIS — E1122 Type 2 diabetes mellitus with diabetic chronic kidney disease: Secondary | ICD-10-CM | POA: Diagnosis not present

## 2022-05-13 DIAGNOSIS — J9611 Chronic respiratory failure with hypoxia: Secondary | ICD-10-CM | POA: Diagnosis present

## 2022-05-13 DIAGNOSIS — D631 Anemia in chronic kidney disease: Secondary | ICD-10-CM | POA: Diagnosis present

## 2022-05-13 DIAGNOSIS — Z9049 Acquired absence of other specified parts of digestive tract: Secondary | ICD-10-CM

## 2022-05-13 DIAGNOSIS — Z1619 Resistance to other specified beta lactam antibiotics: Secondary | ICD-10-CM | POA: Diagnosis present

## 2022-05-13 DIAGNOSIS — L8990 Pressure ulcer of unspecified site, unspecified stage: Secondary | ICD-10-CM | POA: Insufficient documentation

## 2022-05-13 DIAGNOSIS — Z86718 Personal history of other venous thrombosis and embolism: Secondary | ICD-10-CM

## 2022-05-13 DIAGNOSIS — Z1611 Resistance to penicillins: Secondary | ICD-10-CM | POA: Diagnosis present

## 2022-05-13 DIAGNOSIS — J209 Acute bronchitis, unspecified: Secondary | ICD-10-CM | POA: Diagnosis not present

## 2022-05-13 DIAGNOSIS — Z8616 Personal history of COVID-19: Secondary | ICD-10-CM | POA: Diagnosis not present

## 2022-05-13 DIAGNOSIS — E119 Type 2 diabetes mellitus without complications: Secondary | ICD-10-CM

## 2022-05-13 DIAGNOSIS — J449 Chronic obstructive pulmonary disease, unspecified: Secondary | ICD-10-CM | POA: Diagnosis not present

## 2022-05-13 DIAGNOSIS — Z7984 Long term (current) use of oral hypoglycemic drugs: Secondary | ICD-10-CM

## 2022-05-13 DIAGNOSIS — R7989 Other specified abnormal findings of blood chemistry: Secondary | ICD-10-CM | POA: Diagnosis not present

## 2022-05-13 DIAGNOSIS — N1831 Chronic kidney disease, stage 3a: Secondary | ICD-10-CM | POA: Diagnosis present

## 2022-05-13 DIAGNOSIS — I82402 Acute embolism and thrombosis of unspecified deep veins of left lower extremity: Secondary | ICD-10-CM | POA: Diagnosis not present

## 2022-05-13 DIAGNOSIS — I5032 Chronic diastolic (congestive) heart failure: Secondary | ICD-10-CM | POA: Diagnosis not present

## 2022-05-13 DIAGNOSIS — E875 Hyperkalemia: Secondary | ICD-10-CM | POA: Diagnosis present

## 2022-05-13 DIAGNOSIS — J440 Chronic obstructive pulmonary disease with acute lower respiratory infection: Secondary | ICD-10-CM | POA: Diagnosis not present

## 2022-05-13 DIAGNOSIS — R41841 Cognitive communication deficit: Secondary | ICD-10-CM | POA: Diagnosis not present

## 2022-05-13 DIAGNOSIS — R81 Glycosuria: Secondary | ICD-10-CM | POA: Diagnosis present

## 2022-05-13 DIAGNOSIS — E1100 Type 2 diabetes mellitus with hyperosmolarity without nonketotic hyperglycemic-hyperosmolar coma (NKHHC): Secondary | ICD-10-CM | POA: Diagnosis not present

## 2022-05-13 DIAGNOSIS — G4733 Obstructive sleep apnea (adult) (pediatric): Secondary | ICD-10-CM | POA: Diagnosis present

## 2022-05-13 DIAGNOSIS — J441 Chronic obstructive pulmonary disease with (acute) exacerbation: Secondary | ICD-10-CM | POA: Diagnosis not present

## 2022-05-13 DIAGNOSIS — L89152 Pressure ulcer of sacral region, stage 2: Secondary | ICD-10-CM | POA: Diagnosis present

## 2022-05-13 DIAGNOSIS — E860 Dehydration: Secondary | ICD-10-CM | POA: Diagnosis present

## 2022-05-13 DIAGNOSIS — H409 Unspecified glaucoma: Secondary | ICD-10-CM | POA: Diagnosis present

## 2022-05-13 DIAGNOSIS — D696 Thrombocytopenia, unspecified: Secondary | ICD-10-CM | POA: Diagnosis present

## 2022-05-13 DIAGNOSIS — K625 Hemorrhage of anus and rectum: Secondary | ICD-10-CM | POA: Diagnosis not present

## 2022-05-13 DIAGNOSIS — I350 Nonrheumatic aortic (valve) stenosis: Secondary | ICD-10-CM | POA: Diagnosis not present

## 2022-05-13 DIAGNOSIS — N179 Acute kidney failure, unspecified: Secondary | ICD-10-CM | POA: Diagnosis present

## 2022-05-13 DIAGNOSIS — E785 Hyperlipidemia, unspecified: Secondary | ICD-10-CM | POA: Diagnosis present

## 2022-05-13 DIAGNOSIS — I129 Hypertensive chronic kidney disease with stage 1 through stage 4 chronic kidney disease, or unspecified chronic kidney disease: Secondary | ICD-10-CM | POA: Diagnosis not present

## 2022-05-13 DIAGNOSIS — E1165 Type 2 diabetes mellitus with hyperglycemia: Secondary | ICD-10-CM | POA: Diagnosis not present

## 2022-05-13 DIAGNOSIS — J690 Pneumonitis due to inhalation of food and vomit: Secondary | ICD-10-CM | POA: Diagnosis not present

## 2022-05-13 DIAGNOSIS — Z79899 Other long term (current) drug therapy: Secondary | ICD-10-CM

## 2022-05-13 DIAGNOSIS — R1313 Dysphagia, pharyngeal phase: Secondary | ICD-10-CM | POA: Diagnosis not present

## 2022-05-13 DIAGNOSIS — R2689 Other abnormalities of gait and mobility: Secondary | ICD-10-CM | POA: Diagnosis not present

## 2022-05-13 DIAGNOSIS — I130 Hypertensive heart and chronic kidney disease with heart failure and stage 1 through stage 4 chronic kidney disease, or unspecified chronic kidney disease: Secondary | ICD-10-CM | POA: Diagnosis not present

## 2022-05-13 DIAGNOSIS — M6281 Muscle weakness (generalized): Secondary | ICD-10-CM | POA: Diagnosis not present

## 2022-05-13 DIAGNOSIS — J9811 Atelectasis: Secondary | ICD-10-CM | POA: Diagnosis not present

## 2022-05-13 DIAGNOSIS — I509 Heart failure, unspecified: Secondary | ICD-10-CM | POA: Diagnosis not present

## 2022-05-13 DIAGNOSIS — B9620 Unspecified Escherichia coli [E. coli] as the cause of diseases classified elsewhere: Secondary | ICD-10-CM | POA: Diagnosis present

## 2022-05-13 DIAGNOSIS — Z91199 Patient's noncompliance with other medical treatment and regimen due to unspecified reason: Secondary | ICD-10-CM

## 2022-05-13 DIAGNOSIS — N390 Urinary tract infection, site not specified: Secondary | ICD-10-CM | POA: Diagnosis present

## 2022-05-13 DIAGNOSIS — R0602 Shortness of breath: Secondary | ICD-10-CM | POA: Diagnosis not present

## 2022-05-13 DIAGNOSIS — R739 Hyperglycemia, unspecified: Secondary | ICD-10-CM | POA: Diagnosis not present

## 2022-05-13 DIAGNOSIS — Z881 Allergy status to other antibiotic agents status: Secondary | ICD-10-CM

## 2022-05-13 DIAGNOSIS — R531 Weakness: Secondary | ICD-10-CM | POA: Diagnosis not present

## 2022-05-13 DIAGNOSIS — Z888 Allergy status to other drugs, medicaments and biological substances status: Secondary | ICD-10-CM

## 2022-05-13 DIAGNOSIS — U071 COVID-19: Secondary | ICD-10-CM | POA: Diagnosis not present

## 2022-05-13 DIAGNOSIS — T380X5A Adverse effect of glucocorticoids and synthetic analogues, initial encounter: Secondary | ICD-10-CM | POA: Diagnosis present

## 2022-05-13 DIAGNOSIS — Z7901 Long term (current) use of anticoagulants: Secondary | ICD-10-CM

## 2022-05-13 DIAGNOSIS — I10 Essential (primary) hypertension: Secondary | ICD-10-CM | POA: Diagnosis present

## 2022-05-13 DIAGNOSIS — K649 Unspecified hemorrhoids: Secondary | ICD-10-CM | POA: Diagnosis present

## 2022-05-13 DIAGNOSIS — R4182 Altered mental status, unspecified: Secondary | ICD-10-CM | POA: Diagnosis not present

## 2022-05-13 DIAGNOSIS — J439 Emphysema, unspecified: Secondary | ICD-10-CM | POA: Diagnosis not present

## 2022-05-13 DIAGNOSIS — Z741 Need for assistance with personal care: Secondary | ICD-10-CM | POA: Diagnosis not present

## 2022-05-13 DIAGNOSIS — I82401 Acute embolism and thrombosis of unspecified deep veins of right lower extremity: Secondary | ICD-10-CM | POA: Diagnosis not present

## 2022-05-13 DIAGNOSIS — R059 Cough, unspecified: Secondary | ICD-10-CM | POA: Diagnosis not present

## 2022-05-13 DIAGNOSIS — Z823 Family history of stroke: Secondary | ICD-10-CM

## 2022-05-13 DIAGNOSIS — M6259 Muscle wasting and atrophy, not elsewhere classified, multiple sites: Secondary | ICD-10-CM | POA: Diagnosis not present

## 2022-05-13 DIAGNOSIS — R451 Restlessness and agitation: Secondary | ICD-10-CM | POA: Diagnosis present

## 2022-05-13 DIAGNOSIS — I82409 Acute embolism and thrombosis of unspecified deep veins of unspecified lower extremity: Secondary | ICD-10-CM | POA: Diagnosis not present

## 2022-05-13 DIAGNOSIS — Z86711 Personal history of pulmonary embolism: Secondary | ICD-10-CM

## 2022-05-13 DIAGNOSIS — M8580 Other specified disorders of bone density and structure, unspecified site: Secondary | ICD-10-CM | POA: Diagnosis present

## 2022-05-13 LAB — CBG MONITORING, ED
Glucose-Capillary: >600 mg/dL (ref 70–99)
Glucose-Capillary: >600 mg/dL (ref 70–99)
Glucose-Capillary: 545 mg/dL (ref 70–99)
Glucose-Capillary: 370 mg/dL — ABNORMAL HIGH (ref 70–99)

## 2022-05-13 LAB — CBC WITH DIFFERENTIAL/PLATELET
Abs Immature Granulocytes: 0.15 10*3/uL — ABNORMAL HIGH (ref 0.00–0.07)
Basophils Absolute: 0 10*3/uL (ref 0.0–0.1)
Basophils Relative: 0 %
Eosinophils Absolute: 0 10*3/uL (ref 0.0–0.5)
Eosinophils Relative: 0 %
HCT: 38.9 % (ref 36.0–46.0)
Hemoglobin: 12.8 g/dL (ref 12.0–15.0)
Immature Granulocytes: 1 %
Lymphocytes Relative: 4 %
Lymphs Abs: 0.8 10*3/uL (ref 0.7–4.0)
MCH: 29.2 pg (ref 26.0–34.0)
MCHC: 32.9 g/dL (ref 30.0–36.0)
MCV: 88.8 fL (ref 80.0–100.0)
Monocytes Absolute: 1.2 10*3/uL — ABNORMAL HIGH (ref 0.1–1.0)
Monocytes Relative: 6 %
Neutro Abs: 17.1 10*3/uL — ABNORMAL HIGH (ref 1.7–7.7)
Neutrophils Relative %: 89 %
Platelets: UNDETERMINED 10*3/uL (ref 150–400)
RBC: 4.38 MIL/uL (ref 3.87–5.11)
RDW: 13 % (ref 11.5–15.5)
WBC: 19.2 10*3/uL — ABNORMAL HIGH (ref 4.0–10.5)
nRBC: 0 % (ref 0.0–0.2)

## 2022-05-13 LAB — COMPREHENSIVE METABOLIC PANEL
ALT: 24 U/L (ref 0–44)
AST: 21 U/L (ref 15–41)
Albumin: 3.7 g/dL (ref 3.5–5.0)
Alkaline Phosphatase: 150 U/L — ABNORMAL HIGH (ref 38–126)
Anion gap: 12 (ref 5–15)
BUN: 67 mg/dL — ABNORMAL HIGH (ref 8–23)
CO2: 23 mmol/L (ref 22–32)
Calcium: 9.8 mg/dL (ref 8.9–10.3)
Chloride: 87 mmol/L — ABNORMAL LOW (ref 98–111)
Creatinine, Ser: 1.32 mg/dL — ABNORMAL HIGH (ref 0.44–1.00)
GFR, Estimated: 39 mL/min — ABNORMAL LOW (ref >60)
Glucose, Bld: 861 mg/dL (ref 70–99)
Potassium: 6 mmol/L — ABNORMAL HIGH (ref 3.5–5.1)
Sodium: 122 mmol/L — ABNORMAL LOW (ref 135–145)
Total Bilirubin: 1.4 mg/dL — ABNORMAL HIGH (ref 0.3–1.2)
Total Protein: 6.8 g/dL (ref 6.5–8.1)

## 2022-05-13 LAB — BLOOD GAS, VENOUS
Acid-Base Excess: 2.2 mmol/L — ABNORMAL HIGH (ref 0.0–2.0)
Bicarbonate: 28.3 mmol/L — ABNORMAL HIGH (ref 20.0–28.0)
O2 Saturation: 78.4 %
Patient temperature: 37
pCO2, Ven: 49 mmHg (ref 44–60)
pH, Ven: 7.37 (ref 7.25–7.43)
pO2, Ven: 46 mmHg — ABNORMAL HIGH (ref 32–45)

## 2022-05-13 MED ORDER — INSULIN REGULAR(HUMAN) IN NACL 100-0.9 UT/100ML-% IV SOLN
INTRAVENOUS | Status: DC
  Administered 2022-05-13: 7.5 [IU]/h via INTRAVENOUS

## 2022-05-13 MED ORDER — DEXTROSE 50 % IV SOLN: 0.0000 mL | INTRAVENOUS | Status: DC | PRN

## 2022-05-13 MED ORDER — SODIUM CHLORIDE 0.9 % IV BOLUS
1000.0000 mL | Freq: Once | INTRAVENOUS | Status: AC
  Administered 2022-05-13: 1000 mL via INTRAVENOUS

## 2022-05-13 MED ORDER — RIVAROXABAN 15 MG PO TABS
15.0000 mg | ORAL_TABLET | Freq: Two times a day (BID) | ORAL | Status: DC
  Administered 2022-05-14 – 2022-05-21 (×16): 15 mg via ORAL
  Filled 2022-05-13 (×16): qty 1

## 2022-05-13 MED ORDER — IPRATROPIUM-ALBUTEROL 0.5-2.5 (3) MG/3ML IN SOLN
3.0000 mL | Freq: Four times a day (QID) | RESPIRATORY_TRACT | Status: DC
  Administered 2022-05-14: 3 mL via RESPIRATORY_TRACT
  Filled 2022-05-13: qty 3

## 2022-05-13 MED ORDER — SODIUM ZIRCONIUM CYCLOSILICATE 10 G PO PACK
10.0000 g | PACK | Freq: Once | ORAL | Status: AC
  Administered 2022-05-13: 10 g via ORAL
  Filled 2022-05-13: qty 1

## 2022-05-13 MED ORDER — RIVAROXABAN 20 MG PO TABS: 20.0000 mg | ORAL_TABLET | Freq: Every day | ORAL | Status: DC

## 2022-05-13 MED ORDER — SODIUM CHLORIDE 0.9 % IV SOLN: INTRAVENOUS | Status: DC

## 2022-05-13 MED ORDER — DEXTROSE IN LACTATED RINGERS 5 % IV SOLN: INTRAVENOUS | Status: DC

## 2022-05-13 MED ORDER — INSULIN REGULAR(HUMAN) IN NACL 100-0.9 UT/100ML-% IV SOLN
INTRAVENOUS | Status: DC
  Administered 2022-05-13: 11.5 [IU]/h via INTRAVENOUS
  Filled 2022-05-13: qty 100

## 2022-05-13 MED ORDER — PANTOPRAZOLE SODIUM 40 MG PO TBEC
40.0000 mg | DELAYED_RELEASE_TABLET | Freq: Every day | ORAL | Status: DC
  Administered 2022-05-14 – 2022-05-22 (×9): 40 mg via ORAL
  Filled 2022-05-13 (×9): qty 1

## 2022-05-13 NOTE — H&P (Addendum)
[name] EYC:[id number] DOB: 16-Jul-1930 DOA: 05/13/2022   PCP: [name], DO   Outpatient Specialists:   CARDS: Dr. Einar Gip    Patient arrived to ER on 05/13/22 at 1813 Referred by Attending Sherwood Gambler, MD   Patient coming from:    home Lives   With family    Chief Complaint:   Chief Complaint  Patient presents with   Abnormal Lab    HPI: [name] is a 89 y.o. female with medical history significant of HTN,HLD, CHF diastolic, thrombocytopenia, anemia, Bilateral Lower Extremity DVT     Presented with   abnormal labs Patient known to be diabetic just recently admitted for COVID known history of COPD at baseline requiring up to 4 L She reports wanted to for follow-up appointment with her primary care provider today and was found to have potassium of 6.4 glucose of 500 She was recently diagnosed with DVTs in the setting of COVID and started on Eliquis but has not been able to take its due to pharmacy not having it for the past 6 days so was started on home pack Xarelto today.  Also she was given insulin Tyler Aas in PCP office 15 units and was sent to ER Patient noted to have more confusion daughter provides most of the history Recently for show started on prednisone taper in the setting of COVID and COPD. Her PCP has prescribed her Lokelma today after blood work showed potassium of 6.4 and AKI but when the family went to pharmacy her medicine was not available to either so they were told to go to emergency department Patient has been fairly sleepy and fatigued   She has not been checking her BG at home  Last does of the prednisone was today Family states pt has had COVID brain has been agitated and confused  Lab Results  Component Value Date   [place] (A) 05/03/2022   [place] NEGATIVE 12/06/2019   [place] NEGATIVE 02/26/2019      Never smoked or drank etoh  Regarding pertinent Chronic problems:      HTN on Norvasc, Imdur    chronic CHF diastolic  - last echo 1497 Normal global wall motion. Doppler evidence of grade II       DM 2 -  Lab Results  Component Value Date   HGBA1C 10.3 (H) 05/03/2022   PO meds only,     obesity-   BMI Readings from Last 1 Encounters:  05/03/22 35.19 kg/m      COPD -      on baseline oxygen  4L for sleep only    OSA  noncompliant with CPAP     Hx of DVT/PE on - anticoagulation with Xarelto    CKD stage IIIa- baseline Cr1.2 Estimated Creatinine Clearance: 31.9 mL/min (A) (by C-G formula based on SCr of 1.32 mg/dL (H)).  Lab Results  Component Value Date   CREATININE 1.32 (H) 05/13/2022   CREATININE 1.19 (H) 05/07/2022   CREATININE 1.06 (H) 05/06/2022     Chronic anemia - baseline hg Hemoglobin & Hematocrit  Recent Labs    05/06/22 0419 05/07/22 0444 05/13/22 2111  HGB 11.7* 11.8* 12.8     While in ER: Clinical Course as of 05/13/22 2242  Wed May 13, 2022  2021 Based on the lab work from this afternoon shown to me by the patient's daughter, we will start insulin drip for HHS.  Glucose over 500.  Potassium 6.4 so will be given Lokelma.  BUN and creatinine are also worse at 75 and 1.77 compared to about 6 days ago. [SG]    Clinical Course User Index [SG] Sherwood Gambler, MD     CT HEAD   NON acute  CXR - Normal global wall motion. Doppler evidence of grade II     Following Medications were ordered in ER: Medications  insulin regular, human (MYXREDLIN) 100 units/ 100 mL infusion (11.5 Units/hr Intravenous New Bag/Given 05/13/22 2109)  dextrose 5 % in lactated ringers infusion (0 mLs Intravenous Hold 05/13/22 2101)  dextrose 50 % solution 0-50 mL (has no administration in time range)  sodium chloride 0.9 % bolus 1,000 mL (0 mLs Intravenous Stopped 05/13/22 2215)  sodium zirconium cyclosilicate (LOKELMA) packet 10 g (10 g Oral Given 05/13/22 2102)     ED Triage Vitals  Enc Vitals Group     BP 05/13/22 1926 (!) 168/90     Pulse Rate 05/13/22 1926 (!) 111      Resp 05/13/22 1926 20     Temp 05/13/22 1926 98.1 F (36.7 C)     Temp Source 05/13/22 1926 Oral     SpO2 05/13/22 1926 96 %     Weight --      Height --      Head Circumference --      Peak Flow --      Pain Score 05/13/22 1932 0     Pain Loc --      Pain Edu? --      Excl. in [place]? --   TMAX(24)@     _________________________________________ Significant initial  Findings: Abnormal Labs Reviewed  COMPREHENSIVE METABOLIC PANEL - Abnormal; Notable for the following components:      Result Value   Sodium 122 (*)    Potassium 6.0 (*)    Chloride 87 (*)    Glucose, Bld 861 (*)    BUN 67 (*)    Creatinine, Ser 1.32 (*)    Alkaline Phosphatase 150 (*)    Total Bilirubin 1.4 (*)    GFR, Estimated 39 (*)    All other components within normal limits  CBC WITH DIFFERENTIAL/PLATELET - Abnormal; Notable for the following components:   WBC 19.2 (*)    Neutro Abs 17.1 (*)    Monocytes Absolute 1.2 (*)    Abs Immature Granulocytes 0.15 (*)    All other components within normal limits  BLOOD GAS, VENOUS - Abnormal; Notable for the following components:   pO2, Ven 46 (*)    Bicarbonate 28.3 (*)    Acid-Base Excess 2.2 (*)    All other components within normal limits  CBG MONITORING, ED - Abnormal; Notable for the following components:   Glucose-Capillary >600 (*)    All other components within normal limits  CBG MONITORING, ED - Abnormal; Notable for the following components:   Glucose-Capillary >600 (*)    All other components within normal limits  CBG MONITORING, ED - Abnormal; Notable for the following components:   Glucose-Capillary 545 (*)    All other components within normal limits    _________________________ Troponin  ordered ECG: Ordered Personally reviewed and interpreted by me showing: HR : 92 Rhythm:   Sinus rhythm Prolonged PR interval Left anterior fascicular block Left ventricular hypertrophy Anterior Q waves, possibly due to LVH no significant change  QTC  416   The recent clinical data is shown below. Vitals:   05/13/22 1926 05/13/22 2105 05/13/22 2149  BP: (!) 168/90 (!) 146/65 121/87  Pulse: Marland Kitchen)  111 76 93  Resp: '20 17 16  '$ Temp: 98.1 F (36.7 C)    TempSrc: Oral    SpO2: 96% 98% 96%   WBC     Component Value Date/Time   WBC 19.2 (H) 05/13/2022 2111   LYMPHSABS 0.8 05/13/2022 2111   MONOABS 1.2 (H) 05/13/2022 2111   EOSABS 0.0 05/13/2022 2111   BASOSABS 0.0 05/13/2022 2111     Lactic Acid, Venous    Component Value Date/Time   LATICACIDVEN 1.25 11/05/2017 1957       UA   ordered    Results for orders placed or performed during the hospital encounter of 05/03/22  Resp panel by RT-PCR (RSV, Flu A&B, Covid) Anterior Nasal Swab     Status: Abnormal   Collection Time: 05/03/22 10:09 AM   Specimen: Anterior Nasal Swab  Result Value Ref Range Status   SARS Coronavirus 2 by RT PCR POSITIVE (A) NEGATIVE Final        Influenza A by PCR NEGATIVE NEGATIVE Final   Influenza B by PCR NEGATIVE NEGATIVE Final        Resp Syncytial Virus by PCR NEGATIVE NEGATIVE Final       Respiratory (~20 pathogens) panel by PCR     Status: None   Collection Time: 05/04/22  5:41 PM   Specimen: Nasopharyngeal Swab; Respiratory  Result Value Ref Range Status   Adenovirus NOT DETECTED NOT DETECTED Final   Coronavirus 229E NOT DETECTED NOT DETECTED Final    Comment: (NOTE) The Coronavirus on the Respiratory Panel, DOES NOT test for the novel  Coronavirus (2019 nCoV)    Coronavirus HKU1 NOT DETECTED NOT DETECTED Final   Coronavirus NL63 NOT DETECTED NOT DETECTED Final   Coronavirus OC43 NOT DETECTED NOT DETECTED Final   Metapneumovirus NOT DETECTED NOT DETECTED Final   Rhinovirus / Enterovirus NOT DETECTED NOT DETECTED Final   Influenza A NOT DETECTED NOT DETECTED Final   Influenza B NOT DETECTED NOT DETECTED Final   Parainfluenza Virus 1 NOT DETECTED NOT DETECTED Final   Parainfluenza Virus 2 NOT DETECTED NOT DETECTED Final   Parainfluenza  Virus 3 NOT DETECTED NOT DETECTED Final   Parainfluenza Virus 4 NOT DETECTED NOT DETECTED Final   Respiratory Syncytial Virus NOT DETECTED NOT DETECTED Final   Bordetella pertussis NOT DETECTED NOT DETECTED Final   Bordetella Parapertussis NOT DETECTED NOT DETECTED Final   Chlamydophila pneumoniae NOT DETECTED NOT DETECTED Final   Mycoplasma pneumoniae NOT DETECTED NOT DETECTED Final    Comment: Performed at [facility], [address], [place], [place] [address]     _______________________________________________ Hospitalist was called for admission for  Hyperosmolar hyperglycemic state  The following Work up has been ordered so far:  Orders Placed This Encounter  Procedures   DG Chest 2 View   CT Head Wo Contrast   DG Chest Portable 1 View   Comprehensive metabolic panel   CBC with Differential   Osmolality   Urinalysis, Routine w reflex microscopic   Blood gas, venous   Diet NPO time specified   Cardiac monitoring   Initiate Carrier Fluid Protocol   Notify physician (specify)   If present, discontinue Insulin Pump after IV Insulin is initiated.   Do NOT use lab glucose values in EndoTool.  If CBG meter reads "Critical High", enter 600.   Upon IV fluid bolus completion, place order for STAT BMET (LAB15) and call provider with results.   IV bolus already initiated   K+ > 5  mEq/L and/or K+ addressed separately   Consult to hospitalist   Pulse oximetry, continuous   CBG monitoring, ED   CBG monitoring, ED   CBG monitoring, ED   EKG 12-Lead   ED EKG   Insert peripheral IV     OTHER Significant initial  Findings:  labs showing:    Recent Labs  Lab 05/07/22 0444 05/13/22 2111  NA 132* 122*  K 4.8 6.0*  CO2 25 23  GLUCOSE 363* 861*  BUN 67* 67*  CREATININE 1.19* 1.32*  CALCIUM 8.2* 9.8  MG 2.4  --   PHOS 2.3*  --     Cr  Up from baseline see below Lab Results  Component Value Date   CREATININE 1.32 (H) 05/13/2022   CREATININE 1.19 (H) 05/07/2022    CREATININE 1.06 (H) 05/06/2022    Recent Labs  Lab 05/07/22 0444 05/13/22 2111  AST 13* 21  ALT 26 24  ALKPHOS 100 150*  BILITOT 0.4 1.4*  PROT 5.4* 6.8  ALBUMIN 2.6* 3.7   Lab Results  Component Value Date   CALCIUM 9.8 05/13/2022   PHOS 2.3 (L) 05/07/2022    Plt: Lab Results  Component Value Date   PLT PLATELET CLUMPS NOTED ON SMEAR, UNABLE TO ESTIMATE 05/13/2022     COVID-19 Labs  No results for input(s): "DDIMER", "FERRITIN", "LDH", "CRP" in the last 72 hours.  Lab Results  Component Value Date   SARSCOV2NAA POSITIVE (A) 05/03/2022   [place] NEGATIVE 12/06/2019   [place] NEGATIVE 02/26/2019    Venous  Blood Gas result:  pH  7.37 Acid-Base Excess 2.2 High  mmol/L   pCO2, Ven 49 mmHg O2 Saturation 78.4 %  pO2, Ven 46 High  mmHg         Recent Labs  Lab 05/07/22 0444 05/13/22 2111  WBC 11.2* 19.2*  NEUTROABS 9.4* 17.1*  HGB 11.8* 12.8  HCT 36.9 38.9  MCV 91.1 88.8  PLT PLATELET CLUMPS NOTED ON SMEAR, COUNT APPEARS DECREASED PLATELET CLUMPS NOTED ON SMEAR, UNABLE TO ESTIMATE    HG/HCT  stable,      Component Value Date/Time   HGB 12.8 05/13/2022 2111   HGB 11.5 10/17/2019 1332   HCT 38.9 05/13/2022 2111   HCT 35.2 10/17/2019 1332   MCV 88.8 05/13/2022 2111   MCV 93 10/17/2019 1332    No results for input(s): "LIPASE", "AMYLASE" in the last 168 hours. No results for input(s): "AMMONIA" in the last 168 hours.    Cardiac Panel (last 3 results) No results for input(s): "CKTOTAL", "CKMB", "TROPONINI", "RELINDX" in the last 72 hours.  .car BNP (last 3 results) Recent Labs    05/03/22 1000  BNP 127.4*      DM  labs:  HbA1C: Recent Labs    05/03/22 1044  HGBA1C 10.3*       CBG (last 3)  Recent Labs    05/13/22 1933 05/13/22 2105 05/13/22 2215  GLUCAP >600* >600* 545*    Cultures:    Component Value Date/Time   SDES EXPECTORATED SPUTUM 12/07/2019 2142   SDES EXPECTORATED SPUTUM 12/07/2019 2142   SPECREQUEST NONE  12/07/2019 2142   SPECREQUEST NONE Reflexed from [id number] 12/07/2019 2142   CULT  12/07/2019 2142    Normal respiratory flora-no Staph aureus or Pseudomonas seen Performed at [facility], [place] [address], [place], [place] [address]    REPTSTATUS 12/08/2019 FINAL 12/07/2019 2142   REPTSTATUS 12/10/2019 FINAL 12/07/2019 2142     Radiological Exams on Admission: CT  Head Wo Contrast  Result Date: 05/13/2022 CLINICAL DATA:  Hyperkalemia, diabetes, mental status change EXAM: CT HEAD WITHOUT CONTRAST TECHNIQUE: Contiguous axial images were obtained from the base of the skull through the vertex without intravenous contrast. RADIATION DOSE REDUCTION: This exam was performed according to the departmental dose-optimization program which includes automated exposure control, adjustment of the mA and/or kV according to patient size and/or use of iterative reconstruction technique. COMPARISON:  10/12/2013 FINDINGS: Brain: No acute infarct or hemorrhage. Hypodensities in the periventricular white matter consistent with chronic small vessel ischemic change. The lateral ventricles and remaining midline structures are unremarkable. No acute extra-axial fluid collections. No mass effect. Vascular: Atherosclerosis of the internal carotid arteries. No hyperdense vessel. Skull: Normal. Negative for fracture or focal lesion. Sinuses/Orbits: Mucous retention cyst or polypoid mucosal thickening right posterior ethmoid air cell, stable. The remaining paranasal sinuses are clear. Other: None. IMPRESSION: 1. No acute intracranial process. Electronically Signed   By: Randa Ngo M.D.   On: 05/13/2022 21:31   DG Chest Portable 1 View  Result Date: 05/13/2022 CLINICAL DATA:  Altered mental status, weakness, shortness of breath, COVID. EXAM: PORTABLE CHEST 1 VIEW COMPARISON:  05/07/2022. FINDINGS: Heart is enlarged and the mediastinal contour stable. There is atherosclerotic calcification of the aorta. Lung volumes are low  and there is mild atelectasis is present at the left lung base. No consolidation, effusion, or pneumothorax. Evidence of prior rotator cuff repair is noted on the right. Degenerative changes in the thoracic spine. No acute osseous abnormality. IMPRESSION: 1. Mild atelectasis at the left lung base. 2. Cardiomegaly. Electronically Signed   By: Brett Fairy M.D.   On: 05/13/2022 20:39   _______________________________________________________________________________________________________ Latest  Blood pressure 121/87, pulse 93, temperature 98.1 F (36.7 C), temperature source Oral, resp. rate 16, SpO2 96 %.   Vitals  labs and radiology finding personally reviewed  Review of Systems:    Pertinent positives include:  fatigue, increased urination  Constitutional:  No weight loss, night sweats, Fevers, chills, weight loss  HEENT:  No headaches, Difficulty swallowing,Tooth/dental problems,Sore throat,  No sneezing, itching, ear ache, nasal congestion, post nasal drip,  Cardio-vascular:  No chest pain, Orthopnea, PND, anasarca, dizziness, palpitations.no Bilateral lower extremity swelling  GI:  No heartburn, indigestion, abdominal pain, nausea, vomiting, diarrhea, change in bowel habits, loss of appetite, melena, blood in stool, hematemesis Resp:  no shortness of breath at rest. No dyspnea on exertion, No excess mucus, no productive cough, No non-productive cough, No coughing up of blood.No change in color of mucus.No wheezing. Skin:  no rash or lesions. No jaundice GU:  no dysuria, change in color of urine, no urgency or frequency. No straining to urinate.  No flank pain.  Musculoskeletal:  No joint pain or no joint swelling. No decreased range of motion. No back pain.  Psych:  No change in mood or affect. No depression or anxiety. No memory loss.  Neuro: no localizing neurological complaints, no tingling, no weakness, no double vision, no gait abnormality, no slurred speech, no  confusion  All systems reviewed and apart from [place] all are negative _______________________________________________________________________________________________ Past Medical History:   Past Medical History:  Diagnosis Date   Cellulitis and abscess of left leg    CHF (congestive heart failure) (HCC)    COPD (chronic obstructive pulmonary disease) ([place])    Diabetes mellitus    Glaucoma    Hyperlipidemia    Hypertension      Past Surgical History:  Procedure Laterality Date  BACK SURGERY     CARDIAC CATHETERIZATION N/A 12/09/2015   Procedure: Right Heart Cath;  Surgeon: Adrian Prows, MD;  Location: [facility];  Service: Cardiovascular;  Laterality: N/A;   CHOLECYSTECTOMY  2000   HERNIA REPAIR  2007   rupture disk  1970"s   SHOULDER ARTHROSCOPY WITH ROTATOR CUFF REPAIR  1999   rt shoulder    Social History:  Ambulatory  walker     reports that she has never smoked. She has never used smokeless tobacco. She reports that she does not drink alcohol and does not use drugs.   Family History:  Family History  Problem Relation Age of Onset   Stroke Mother    ______________________________________________________________________________________________ Allergies: Allergies  Allergen Reactions   Cephalexin Anaphylaxis, Swelling and Other (See Comments)   Micardis [Telmisartan] Other (See Comments)    Hyperkalemia during hospitalization with pulmonary edema   Atorvastatin Other (See Comments)    Other reaction(s): myalgia   Buspirone Other (See Comments)    Other reaction(s): "couldn't breath"   Canagliflozin Other (See Comments)    Pt does not recall   Doxycycline Hyclate Other (See Comments)    Other reaction(s): nausea   Lipitor [Atorvastatin Calcium] Other (See Comments)    myalgia   Tramadol Nausea Only   Tramadol Hcl Other (See Comments)     Prior to Admission medications   Medication Sig Start Date End Date Taking? Authorizing Provider  acetaminophen  (TYLENOL) 325 MG tablet Take 2 tablets (650 mg total) by mouth every 6 (six) hours as needed for mild pain (or Fever >/= 101). 05/07/22   Sheikh, Omair Latif, DO  amLODipine (NORVASC) 10 MG tablet TAKE 1 TABLET(10 MG) BY MOUTH DAILY Patient taking differently: Take 5 mg by mouth daily. 10/21/21   Adrian Prows, MD  APIXABAN [facility] [place]) VTE STARTER PACK ('10MG'$  AND '5MG'$ ) Take as directed on package: start with two-'5mg'$  tablets twice daily for 7 days. On day 8, switch to one-'5mg'$  tablet twice daily. 05/07/22   Raiford Noble Latif, DO  ascorbic acid (VITAMIN C) 500 MG tablet Take 1 tablet (500 mg total) by mouth daily. 05/08/22   Sheikh, Omair Latif, DO  Brinzolamide-Brimonidine [facility]) 1-0.2 % SUSP Apply 2 drops to eye daily.    [provider]  fluticasone (FLONASE) 50 MCG/ACT nasal spray Place 2 sprays into both nostrils daily. 05/08/22   Sheikh, Omair Latif, DO  guaiFENesin-dextromethorphan (ROBITUSSIN DM) 100-10 MG/5ML syrup Take 5 mLs by mouth every 4 (four) hours as needed for cough. Patient not taking: Reported on 05/03/2022 12/09/19   Terrilee Croak, MD  ipratropium-albuterol (DUONEB) 0.5-2.5 (3) MG/3ML SOLN Take 3 mLs by nebulization every 4 (four) hours as needed. 12/09/19 07/07/21  Terrilee Croak, MD  isosorbide mononitrate (IMDUR) 60 MG 24 hr tablet TAKE 1 TABLET DAILY Patient taking differently: Take 60 mg by mouth daily. 09/15/21   Adrian Prows, MD  liraglutide (VICTOZA) 18 MG/3ML SOPN Inject 0.6 mg into the skin daily.     [provider]  loratadine (CLARITIN) 10 MG tablet Take 1 tablet (10 mg total) by mouth daily. 05/08/22   Raiford Noble Latif, DO  metFORMIN (GLUCOPHAGE) 1000 MG tablet Take 0.5 tablets (500 mg total) by mouth daily. 10/27/16   [name], MD  pantoprazole (PROTONIX) 40 MG tablet Take 1 tablet (40 mg total) by mouth daily. 05/08/22   Sheikh, Omair Latif, DO  predniSONE (STERAPRED UNI-PAK 21 TAB) 10 MG (21) TBPK tablet Take 6 tablets day 1, 5 tablets on  day 2, 4 tablets on  day 3, 3 tablets on day 4, 2 tabs on day 5, 1 tablet on day 6 and then stop on day 7 05/07/22   Sheikh, Omair Latif, DO  ROCKLATAN 0.02-0.005 % SOLN Place 1 drop into both eyes at bedtime. 11/24/19   [provider]  zinc sulfate 220 (50 Zn) MG capsule Take 1 capsule (220 mg total) by mouth daily. 05/08/22   Kerney Elbe, DO    ___________________________________________________________________________________________________ Physical Exam:    05/13/2022    9:49 PM 05/13/2022    9:05 PM 05/13/2022    7:26 PM  Vitals with BMI  Systolic 426 834 196  Diastolic 87 65 90  Pulse 93 76 111    1. General:  in No  Acute distress   Chronically ill   -appearing 2. Psychological: Alert and   Oriented to self and situation  3. Head/ENT:    Dry Mucous Membranes                          Head Non traumatic, neck supple                           Poor Dentition 4. SKIN: decreased Skin turgor,  Skin clean Dry and intact no rash 5. Heart: Regular rate and rhythm no  Murmur, no Rub or gallop 6. Lungs:   no wheezes mild crackles   7. Abdomen: Soft,  non-tender  distended   obese  bowel sounds present 8. Lower extremities: no clubbing, cyanosis, no  edema 9. Neurologically Grossly intact, moving all 4 extremities equally   10. MSK: Normal range of motion    Chart has been reviewed  ______________________________________________________________________________________________  Assessment/Plan 89 y.o. female with medical history significant of HTN,HLD, CHF diastolic, thrombocytopenia, anemia, Bilateral Lower Extremity DVT    Admitted for   Hyperosmolar hyperglycemic state    Present on Admission:  Hyperosmolar hyperglycemic state (HHS) ([place])  AKI (acute kidney injury) ([place])  Chronic diastolic CHF (congestive heart failure) (HCC)  Essential hypertension  Chronic hypoxic respiratory failure (HCC)  Chronic obstructive pulmonary disease, unspecified (HCC)  Obstructive sleep apnea  syndrome  Elevated lactic acid level     AKI (acute kidney injury) ([place]) In the setting of HHS    Hyperosmolar hyperglycemic state (HHS) ([place]) will admit per  HHS protocol, obtain serial BMET, start on glucosestabalizer, aggressive IVF.    So far work up of possible causes of  HSS with CXR, ECG one set of cardiac enzymes, UA.    Most likely cause recent steroid use Monitor in [place]. Replace potassium as needed.    Consult diabetes coordinator    Chronic diastolic CHF (congestive heart failure) (HCC) Monitor fluid status avoid    fluid overload  Diabetes mellitus (HCC) Currently needing insulin drip.  May need long-term insulin and careful monitoring  Essential hypertension Soft blood pressures overall permissive hypertension for tonight  Chronic hypoxic respiratory failure (HCC) Chronic in the setting of COPD continue oxygen therapy  Chronic obstructive pulmonary disease, unspecified (HCC) Chronic continue home medications and oxygen  Obstructive sleep apnea syndrome  Non compliant with CPAP  History of DVT (deep vein thrombosis) Continue xarelto  Elevated lactic acid level Will rehydrate and recheck, BP stable pt states she is doing better   Other plan as per orders.  DVT prophylaxis:  xarelto    Code Status:    Code Status:  Prior FULL CODE   as per family  I had personally discussed CODE STATUS with family      Family Communication:   Family not at  Bedside  plan of care was discussed on the phone with  Daughter,    Disposition Plan:     To home once workup is complete and patient is stable   Following barriers for discharge:                            Electrolytes corrected                                                   Diabetes care coordinator              Consults called: none   Admission status:  ED Disposition     ED Disposition  Admit   Condition  --   Tony: [place] [[id number]]  Level of Care:  Stepdown [14]  Admit to SDU based on following criteria: Other see comments  Comments: HHS  May place patient in [facility] at [facility] or [place] if equivalent level of care is available:: No  Covid Evaluation: Recent COVID positive no isolation required infection day 21-90  Diagnosis: Hyperosmolar hyperglycemic state (HHS) [facility]) [[id number]]  Admitting Physician: [name] [[id number]]  Attending Physician: [name] [[id number]]          Obs     Level of care  stepdown tele indefinitely please discontinue once patient no longer qualifies COVID-19 Labs    Ellery Tash 05/14/2022, 12:43 AM    [facility]     after 2 AM please page floor coverage PA If 7AM-7PM, please contact the day team taking care of the patient using [email or web address]   Patient was evaluated in the context of the global COVID-19 pandemic, which necessitated consideration that the patient might be at risk for infection with the SARS-CoV-2 virus that causes COVID-19. Institutional protocols and algorithms that pertain to the evaluation of patients at risk for COVID-19 are in a state of rapid change based on information released by regulatory bodies including the CDC and federal and state organizations. These policies and algorithms were followed during the patient's care.

## 2022-05-13 NOTE — Assessment & Plan Note (Signed)
will admit per  HHS protocol, obtain serial BMET, start on glucosestabalizer, aggressive IVF.    So far work up of possible causes of  HSS with CXR, ECG one set of cardiac enzymes, UA.    Most likely cause recent steroid use Monitor in [place]. Replace potassium as needed.    Consult diabetes coordinator

## 2022-05-13 NOTE — Assessment & Plan Note (Signed)
Currently needing insulin drip.  May need long-term insulin and careful monitoring

## 2022-05-13 NOTE — ED Provider Triage Note (Signed)
Emergency Medicine Provider Triage Evaluation Note  [name] , a 89 y.o. female  was evaluated in triage.  Pt complains of hyperkalemia.  Patient presents the emergency department being told by primary care provider time for further evaluation as her potassium levels at 6.4 and glucose level has not been reading on home monitor, PCP monitor.  Patient is a type II diabetic.  Patient was recently hospitalized for COVID-19 about 1 and half weeks ago and had improved but is now declining again.  Review of Systems  Positive: As above Negative: As above  Physical Exam  BP (!) 168/90 (BP Location: Right Arm)   Pulse (!) 111   Temp 98.1 F (36.7 C) (Oral)   Resp 20   SpO2 96%  Gen:   Awake, understands verbal commands, but not speaking or interacting during assessment Resp:  Normal effort  MSK:   Moves extremities without difficulty  Other:    Medical Decision Making  Medically screening exam initiated at 7:50 PM.  Appropriate orders placed.  [name] was informed that the remainder of the evaluation will be completed by another provider, this initial triage assessment does not replace that evaluation, and the importance of remaining in the ED until their evaluation is complete.     Luvenia Heller, PA-C 05/13/22 1951

## 2022-05-13 NOTE — Assessment & Plan Note (Signed)
Chronic continue home medications and oxygen

## 2022-05-13 NOTE — Assessment & Plan Note (Addendum)
Noncompliant with CPAP 

## 2022-05-13 NOTE — Assessment & Plan Note (Signed)
Soft blood pressures overall permissive hypertension for tonight

## 2022-05-13 NOTE — Assessment & Plan Note (Signed)
In the setting of HHS

## 2022-05-13 NOTE — Subjective & Objective (Signed)
Patient known to be diabetic just recently admitted for COVID known history of COPD at baseline requiring up to 4 L She reports wanted to for follow-up appointment with her primary care provider today and was found to have potassium of 6.4 glucose of 500 She was recently diagnosed with DVTs in the setting of COVID and started on Eliquis but has not been able to take its due to pharmacy not having it for the past 6 days so was started on home pack Xarelto today.  Also she was given insulin Tyler Aas in PCP office 15 units and was sent to ER Patient noted to have more confusion daughter provides most of the history Recently for show started on prednisone taper in the setting of COVID and COPD. Her PCP has prescribed her Lokelma today after blood work showed potassium of 6.4 and AKI but when the family went to pharmacy her medicine was not available to either so they were told to go to emergency department Patient has been fairly sleepy and fatigued

## 2022-05-13 NOTE — ED Provider Notes (Signed)
[facility] DEPT Provider Note   CSN: [id number] Arrival date & time: 05/13/22  1813     History  Chief Complaint  Patient presents with   Abnormal Lab    [name] is a 89 y.o. female.  HPI 89 year old female with a history of diabetes, hypertension, COPD, CHF and a recent admission for COVID presents with altered mental status and abnormal labs.  History is from the daughter at the bedside.  She was recently discharged less than 1 week ago for COVID.  She was placed on prednisone taper.  For the past 4 days or so she has been altered and not acting like herself.  Family was unable to get the Eliquis that she was supposed to be on for blood clots due to the pharmacy not having it.  Thus her PCP today started her on Xarelto and gave her a 7-monthsupply.  She got a dose today.  [facility] today revealed potassium of 6.4 and an acute kidney injury with hyperglycemia that was over 500 but not definable.  However when they went to the pharmacy of the medicine was not there and they were told to go to the ER.  The patient appears sleepy and fatigued and confused from baseline. No falls according to the daughter.  Home Medications Prior to Admission medications   Medication Sig Start Date End Date Taking? Authorizing Provider  acetaminophen (TYLENOL) 325 MG tablet Take 2 tablets (650 mg total) by mouth every 6 (six) hours as needed for mild pain (or Fever >/= 101). 05/07/22   Sheikh, Omair Latif, DO  amLODipine (NORVASC) 10 MG tablet TAKE 1 TABLET(10 MG) BY MOUTH DAILY Patient taking differently: Take 5 mg by mouth daily. 10/21/21   GAdrian Prows MD  APIXABAN ([facility] [place] VTE STARTER PACK ('10MG'$  AND '5MG'$ ) Take as directed on package: start with two-'5mg'$  tablets twice daily for 7 days. On day 8, switch to one-'5mg'$  tablet twice daily. 05/07/22   SRaiford NobleLatif, DO  ascorbic acid (VITAMIN C) 500 MG tablet Take 1  tablet (500 mg total) by mouth daily. 05/08/22   Sheikh, Omair Latif, DO  Brinzolamide-Brimonidine ([facility] 1-0.2 % SUSP Apply 2 drops to eye daily.    [provider]  fluticasone (FLONASE) 50 MCG/ACT nasal spray Place 2 sprays into both nostrils daily. 05/08/22   Sheikh, Omair Latif, DO  guaiFENesin-dextromethorphan (ROBITUSSIN DM) 100-10 MG/5ML syrup Take 5 mLs by mouth every 4 (four) hours as needed for cough. Patient not taking: Reported on 05/03/2022 12/09/19   DTerrilee Croak MD  ipratropium-albuterol (DUONEB) 0.5-2.5 (3) MG/3ML SOLN Take 3 mLs by nebulization every 4 (four) hours as needed. 12/09/19 07/07/21  DTerrilee Croak MD  isosorbide mononitrate (IMDUR) 60 MG 24 hr tablet TAKE 1 TABLET DAILY Patient taking differently: Take 60 mg by mouth daily. 09/15/21   GAdrian Prows MD  liraglutide (VICTOZA) 18 MG/3ML SOPN Inject 0.6 mg into the skin daily.     [provider]  loratadine (CLARITIN) 10 MG tablet Take 1 tablet (10 mg total) by mouth daily. 05/08/22   SRaiford NobleLatif, DO  metFORMIN (GLUCOPHAGE) 1000 MG tablet Take 0.5 tablets (500 mg total) by mouth daily. 10/27/16   KDixie Dials MD  pantoprazole (PROTONIX) 40 MG tablet Take 1 tablet (40 mg total) by mouth daily. 05/08/22   Sheikh, Omair Latif, DO  predniSONE (STERAPRED UNI-PAK 21 TAB) 10 MG (21) TBPK tablet Take  6 tablets day 1, 5 tablets on day 2, 4 tablets on day 3, 3 tablets on day 4, 2 tabs on day 5, 1 tablet on day 6 and then stop on day 7 05/07/22   Sheikh, Omair Latif, DO  ROCKLATAN 0.02-0.005 % SOLN Place 1 drop into both eyes at bedtime. 11/24/19   [provider]  zinc sulfate 220 (50 Zn) MG capsule Take 1 capsule (220 mg total) by mouth daily. 05/08/22   Raiford Noble Latif, DO      Allergies    Cephalexin, Micardis [telmisartan], Atorvastatin, Buspirone, Canagliflozin, Doxycycline hyclate, Lipitor [atorvastatin calcium], Tramadol, and Tramadol hcl    Review of Systems   Review of Systems   Constitutional:  Positive for activity change and fatigue. Negative for fever.  Respiratory:  Negative for shortness of breath.   Genitourinary:  Positive for frequency.  Psychiatric/Behavioral:  Positive for confusion.     Physical Exam Updated Vital Signs BP (!) 124/52   Pulse (!) 58   Temp 98.1 F (36.7 C) (Oral)   Resp 18   SpO2 96%  Physical Exam Vitals and nursing note reviewed.  Constitutional:      Appearance: She is well-developed. She is ill-appearing.  HENT:     Head: Normocephalic and atraumatic.  Cardiovascular:     Rate and Rhythm: Normal rate and regular rhythm.     Heart sounds: Normal heart sounds.  Pulmonary:     Effort: Pulmonary effort is normal.     Breath sounds: Normal breath sounds.  Abdominal:     Palpations: Abdomen is soft.     Tenderness: There is no abdominal tenderness.  Skin:    General: Skin is warm and dry.  Neurological:     Mental Status: She is alert.     Comments: Patient is oriented to day of week, month, place. Disoriented to year (2023). Equal strength in all 4 extremities. She is sleepy but awake. Slow to move.     ED Results / Procedures / Treatments   Labs (all labs ordered are listed, but only abnormal results are displayed) Labs Reviewed  COMPREHENSIVE METABOLIC PANEL - Abnormal; Notable for the following components:      Result Value   Sodium 122 (*)    Potassium 6.0 (*)    Chloride 87 (*)    Glucose, Bld 861 (*)    BUN 67 (*)    Creatinine, Ser 1.32 (*)    Alkaline Phosphatase 150 (*)    Total Bilirubin 1.4 (*)    GFR, Estimated 39 (*)    All other components within normal limits  CBC WITH DIFFERENTIAL/PLATELET - Abnormal; Notable for the following components:   WBC 19.2 (*)    Neutro Abs 17.1 (*)    Monocytes Absolute 1.2 (*)    Abs Immature Granulocytes 0.15 (*)    All other components within normal limits  BLOOD GAS, VENOUS - Abnormal; Notable for the following components:   pO2, Ven 46 (*)    Bicarbonate  28.3 (*)    Acid-Base Excess 2.2 (*)    All other components within normal limits  CBG MONITORING, ED - Abnormal; Notable for the following components:   Glucose-Capillary >600 (*)    All other components within normal limits  CBG MONITORING, ED - Abnormal; Notable for the following components:   Glucose-Capillary >600 (*)    All other components within normal limits  CBG MONITORING, ED - Abnormal; Notable for the following components:   Glucose-Capillary 545 (*)  All other components within normal limits  OSMOLALITY  URINALYSIS, ROUTINE W REFLEX MICROSCOPIC    EKG EKG Interpretation  Date/Time:  Wednesday May 13 2022 19:36:08 EST Ventricular Rate:  92 PR Interval:  228 QRS Duration: 81 QT Interval:  336 QTC Calculation: 416 R Axis:   -52 Text Interpretation: Sinus rhythm Prolonged PR interval Left anterior fascicular block Left ventricular hypertrophy Anterior Q waves, possibly due to LVH no significant change since May 03 2022 Confirmed by Sherwood Gambler [phone]) on 05/13/2022 8:00:27 PM  Radiology CT Head Wo Contrast  Result Date: 05/13/2022 CLINICAL DATA:  Hyperkalemia, diabetes, mental status change EXAM: CT HEAD WITHOUT CONTRAST TECHNIQUE: Contiguous axial images were obtained from the base of the skull through the vertex without intravenous contrast. RADIATION DOSE REDUCTION: This exam was performed according to the departmental dose-optimization program which includes automated exposure control, adjustment of the mA and/or kV according to patient size and/or use of iterative reconstruction technique. COMPARISON:  10/12/2013 FINDINGS: Brain: No acute infarct or hemorrhage. Hypodensities in the periventricular white matter consistent with chronic small vessel ischemic change. The lateral ventricles and remaining midline structures are unremarkable. No acute extra-axial fluid collections. No mass effect. Vascular: Atherosclerosis of the internal carotid arteries. No hyperdense  vessel. Skull: Normal. Negative for fracture or focal lesion. Sinuses/Orbits: Mucous retention cyst or polypoid mucosal thickening right posterior ethmoid air cell, stable. The remaining paranasal sinuses are clear. Other: None. IMPRESSION: 1. No acute intracranial process. Electronically Signed   By: Randa Ngo M.D.   On: 05/13/2022 21:31   DG Chest Portable 1 View  Result Date: 05/13/2022 CLINICAL DATA:  Altered mental status, weakness, shortness of breath, COVID. EXAM: PORTABLE CHEST 1 VIEW COMPARISON:  05/07/2022. FINDINGS: Heart is enlarged and the mediastinal contour stable. There is atherosclerotic calcification of the aorta. Lung volumes are low and there is mild atelectasis is present at the left lung base. No consolidation, effusion, or pneumothorax. Evidence of prior rotator cuff repair is noted on the right. Degenerative changes in the thoracic spine. No acute osseous abnormality. IMPRESSION: 1. Mild atelectasis at the left lung base. 2. Cardiomegaly. Electronically Signed   By: Brett Fairy M.D.   On: 05/13/2022 20:39    Procedures .Critical Care  Performed by: Sherwood Gambler, MD Authorized by: Sherwood Gambler, MD   Critical care provider statement:    Critical care time (minutes):  35   Critical care time was exclusive of:  Separately billable procedures and treating other patients   Critical care was necessary to treat or prevent imminent or life-threatening deterioration of the following conditions:  Endocrine crisis and CNS failure or compromise   Critical care was time spent personally by me on the following activities:  Development of treatment plan with patient or surrogate, discussions with consultants, evaluation of patient's response to treatment, examination of patient, ordering and review of laboratory studies, ordering and review of radiographic studies, ordering and performing treatments and interventions, pulse oximetry, re-evaluation of patient's condition and review  of old charts     Medications Ordered in ED Medications  insulin regular, human (MYXREDLIN) 100 units/ 100 mL infusion (11.5 Units/hr Intravenous New Bag/Given 05/13/22 2109)  dextrose 5 % in lactated ringers infusion (0 mLs Intravenous Hold 05/13/22 2101)  dextrose 50 % solution 0-50 mL (has no administration in time range)  sodium chloride 0.9 % bolus 1,000 mL (0 mLs Intravenous Stopped 05/13/22 2215)  sodium zirconium cyclosilicate (LOKELMA) packet 10 g (10 g Oral Given 05/13/22 2102)  ED Course/ Medical Decision Making/ A&P Clinical Course as of 05/13/22 2244  Wed May 13, 2022  2021 Based on the lab work from this afternoon shown to me by the patient's daughter, we will start insulin drip for HHS.  Glucose over 500.  Potassium 6.4 so will be given Lokelma.  BUN and creatinine are also worse at 75 and 1.77 compared to about 6 days ago. [SG]    Clinical Course User Index [SG] Sherwood Gambler, MD                             Medical Decision Making Amount and/or Complexity of Data Reviewed Labs: ordered.    Details: Potassium of 6.0 with a glucose over 800. Leukocytosis as well though this could be related to her recent steroid use. Radiology: ordered and independent interpretation performed.    Details: No head bleed No obvious pneumonia on chest x-ray. ECG/medicine tests: independent interpretation performed.    Details: No acute ischemia compared to baseline.  Risk Prescription drug management. Decision regarding hospitalization.   Patient presents with altered mental status that seems to be coming from her hyperglycemia.  She was started on insulin protocol for HHS.  Osmolality is pending.  Unclear if her WBC is from infection versus steroid use.  Urinalysis will need to be obtained.  Otherwise she is not septic appearing so we will hold off on antibiotics.  She was given IV fluids, Lokelma, and the insulin.  No acute hyperkalemic changes on her EKG.  Discussed with Dr.  Roel Cluck for admission.        Final Clinical Impression(s) / ED Diagnoses Final diagnoses:  Hyperosmolar hyperglycemic state (HHS) [facility])    Rx / DC Orders ED Discharge Orders     None         Sherwood Gambler, MD 05/13/22 2250

## 2022-05-13 NOTE — Assessment & Plan Note (Signed)
Continue xarelto

## 2022-05-13 NOTE — ED Triage Notes (Signed)
Reports going to PCP today and having labs obtained.   K+ 6.4  Glucose >500  Was given 15 units of Tresiba immediately.   Recently admitted for covid + 10 days ago and DVT's. Has not had Xarelto since last Thursday. Seen today and given take home pack so restarted first dose.   Was encouraged to ER for further evaluation. Denies shob or other sx.

## 2022-05-13 NOTE — Assessment & Plan Note (Signed)
Monitor fluid status avoid    fluid overload

## 2022-05-13 NOTE — Assessment & Plan Note (Signed)
Chronic in the setting of COPD continue oxygen therapy

## 2022-05-14 ENCOUNTER — Other Ambulatory Visit (HOSPITAL_COMMUNITY): Payer: Self-pay

## 2022-05-14 DIAGNOSIS — E1122 Type 2 diabetes mellitus with diabetic chronic kidney disease: Secondary | ICD-10-CM | POA: Diagnosis present

## 2022-05-14 DIAGNOSIS — R81 Glycosuria: Secondary | ICD-10-CM | POA: Diagnosis present

## 2022-05-14 DIAGNOSIS — Z8616 Personal history of COVID-19: Secondary | ICD-10-CM | POA: Diagnosis not present

## 2022-05-14 DIAGNOSIS — R739 Hyperglycemia, unspecified: Secondary | ICD-10-CM | POA: Diagnosis not present

## 2022-05-14 DIAGNOSIS — Z9981 Dependence on supplemental oxygen: Secondary | ICD-10-CM | POA: Diagnosis not present

## 2022-05-14 DIAGNOSIS — M6281 Muscle weakness (generalized): Secondary | ICD-10-CM | POA: Diagnosis not present

## 2022-05-14 DIAGNOSIS — R2689 Other abnormalities of gait and mobility: Secondary | ICD-10-CM | POA: Diagnosis not present

## 2022-05-14 DIAGNOSIS — N1831 Chronic kidney disease, stage 3a: Secondary | ICD-10-CM | POA: Diagnosis present

## 2022-05-14 DIAGNOSIS — E1100 Type 2 diabetes mellitus with hyperosmolarity without nonketotic hyperglycemic-hyperosmolar coma (NKHHC): Secondary | ICD-10-CM | POA: Diagnosis present

## 2022-05-14 DIAGNOSIS — R7989 Other specified abnormal findings of blood chemistry: Secondary | ICD-10-CM | POA: Diagnosis not present

## 2022-05-14 DIAGNOSIS — U071 COVID-19: Secondary | ICD-10-CM | POA: Diagnosis not present

## 2022-05-14 DIAGNOSIS — Z7401 Bed confinement status: Secondary | ICD-10-CM | POA: Diagnosis not present

## 2022-05-14 DIAGNOSIS — Z1611 Resistance to penicillins: Secondary | ICD-10-CM | POA: Diagnosis present

## 2022-05-14 DIAGNOSIS — Z1619 Resistance to other specified beta lactam antibiotics: Secondary | ICD-10-CM | POA: Diagnosis present

## 2022-05-14 DIAGNOSIS — E860 Dehydration: Secondary | ICD-10-CM | POA: Diagnosis present

## 2022-05-14 DIAGNOSIS — K625 Hemorrhage of anus and rectum: Secondary | ICD-10-CM | POA: Diagnosis not present

## 2022-05-14 DIAGNOSIS — J449 Chronic obstructive pulmonary disease, unspecified: Secondary | ICD-10-CM | POA: Diagnosis not present

## 2022-05-14 DIAGNOSIS — Z741 Need for assistance with personal care: Secondary | ICD-10-CM | POA: Diagnosis not present

## 2022-05-14 DIAGNOSIS — J439 Emphysema, unspecified: Secondary | ICD-10-CM

## 2022-05-14 DIAGNOSIS — L89152 Pressure ulcer of sacral region, stage 2: Secondary | ICD-10-CM | POA: Diagnosis present

## 2022-05-14 DIAGNOSIS — J9611 Chronic respiratory failure with hypoxia: Secondary | ICD-10-CM | POA: Diagnosis present

## 2022-05-14 DIAGNOSIS — R1313 Dysphagia, pharyngeal phase: Secondary | ICD-10-CM | POA: Diagnosis not present

## 2022-05-14 DIAGNOSIS — D631 Anemia in chronic kidney disease: Secondary | ICD-10-CM | POA: Diagnosis present

## 2022-05-14 DIAGNOSIS — Z86718 Personal history of other venous thrombosis and embolism: Secondary | ICD-10-CM | POA: Diagnosis not present

## 2022-05-14 DIAGNOSIS — N179 Acute kidney failure, unspecified: Secondary | ICD-10-CM | POA: Diagnosis present

## 2022-05-14 DIAGNOSIS — I10 Essential (primary) hypertension: Secondary | ICD-10-CM | POA: Diagnosis not present

## 2022-05-14 DIAGNOSIS — J440 Chronic obstructive pulmonary disease with acute lower respiratory infection: Secondary | ICD-10-CM | POA: Diagnosis not present

## 2022-05-14 DIAGNOSIS — D696 Thrombocytopenia, unspecified: Secondary | ICD-10-CM | POA: Diagnosis present

## 2022-05-14 DIAGNOSIS — H409 Unspecified glaucoma: Secondary | ICD-10-CM | POA: Diagnosis present

## 2022-05-14 DIAGNOSIS — R059 Cough, unspecified: Secondary | ICD-10-CM | POA: Diagnosis not present

## 2022-05-14 DIAGNOSIS — E875 Hyperkalemia: Secondary | ICD-10-CM | POA: Diagnosis not present

## 2022-05-14 DIAGNOSIS — N390 Urinary tract infection, site not specified: Secondary | ICD-10-CM | POA: Diagnosis present

## 2022-05-14 DIAGNOSIS — J209 Acute bronchitis, unspecified: Secondary | ICD-10-CM | POA: Diagnosis not present

## 2022-05-14 DIAGNOSIS — J690 Pneumonitis due to inhalation of food and vomit: Secondary | ICD-10-CM | POA: Diagnosis not present

## 2022-05-14 DIAGNOSIS — I82409 Acute embolism and thrombosis of unspecified deep veins of unspecified lower extremity: Secondary | ICD-10-CM | POA: Diagnosis not present

## 2022-05-14 DIAGNOSIS — I130 Hypertensive heart and chronic kidney disease with heart failure and stage 1 through stage 4 chronic kidney disease, or unspecified chronic kidney disease: Secondary | ICD-10-CM | POA: Diagnosis present

## 2022-05-14 DIAGNOSIS — G4733 Obstructive sleep apnea (adult) (pediatric): Secondary | ICD-10-CM | POA: Diagnosis present

## 2022-05-14 DIAGNOSIS — R41841 Cognitive communication deficit: Secondary | ICD-10-CM | POA: Diagnosis not present

## 2022-05-14 DIAGNOSIS — M6259 Muscle wasting and atrophy, not elsewhere classified, multiple sites: Secondary | ICD-10-CM | POA: Diagnosis not present

## 2022-05-14 DIAGNOSIS — I5032 Chronic diastolic (congestive) heart failure: Secondary | ICD-10-CM | POA: Diagnosis present

## 2022-05-14 DIAGNOSIS — T380X5A Adverse effect of glucocorticoids and synthetic analogues, initial encounter: Secondary | ICD-10-CM | POA: Diagnosis present

## 2022-05-14 LAB — MAGNESIUM
Magnesium: 1.5 mg/dL — ABNORMAL LOW (ref 1.7–2.4)
Magnesium: 1.6 mg/dL — ABNORMAL LOW (ref 1.7–2.4)
Magnesium: 1.6 mg/dL — ABNORMAL LOW (ref 1.7–2.4)

## 2022-05-14 LAB — BASIC METABOLIC PANEL
Anion gap: 7 (ref 5–15)
Anion gap: 6 (ref 5–15)
BUN: 61 mg/dL — ABNORMAL HIGH (ref 8–23)
BUN: 51 mg/dL — ABNORMAL HIGH (ref 8–23)
CO2: 25 mmol/L (ref 22–32)
CO2: 25 mmol/L (ref 22–32)
Calcium: 8.9 mg/dL (ref 8.9–10.3)
Calcium: 8.2 mg/dL — ABNORMAL LOW (ref 8.9–10.3)
Chloride: 100 mmol/L (ref 98–111)
Chloride: 104 mmol/L (ref 98–111)
Creatinine, Ser: 1.13 mg/dL — ABNORMAL HIGH (ref 0.44–1.00)
Creatinine, Ser: 0.94 mg/dL (ref 0.44–1.00)
GFR, Estimated: 47 mL/min — ABNORMAL LOW (ref >60)
GFR, Estimated: 58 mL/min — ABNORMAL LOW (ref >60)
Glucose, Bld: 141 mg/dL — ABNORMAL HIGH (ref 70–99)
Glucose, Bld: 113 mg/dL — ABNORMAL HIGH (ref 70–99)
Potassium: 4.1 mmol/L (ref 3.5–5.1)
Potassium: 3.9 mmol/L (ref 3.5–5.1)
Sodium: 132 mmol/L — ABNORMAL LOW (ref 135–145)
Sodium: 135 mmol/L (ref 135–145)

## 2022-05-14 LAB — CBC WITH DIFFERENTIAL/PLATELET
Abs Immature Granulocytes: 0.16 10*3/uL — ABNORMAL HIGH (ref 0.00–0.07)
Basophils Absolute: 0 10*3/uL (ref 0.0–0.1)
Basophils Relative: 0 %
Eosinophils Absolute: 0.1 10*3/uL (ref 0.0–0.5)
Eosinophils Relative: 1 %
HCT: 35.6 % — ABNORMAL LOW (ref 36.0–46.0)
Hemoglobin: 11.6 g/dL — ABNORMAL LOW (ref 12.0–15.0)
Immature Granulocytes: 1 %
Lymphocytes Relative: 8 %
Lymphs Abs: 1.6 10*3/uL (ref 0.7–4.0)
MCH: 28.6 pg (ref 26.0–34.0)
MCHC: 32.6 g/dL (ref 30.0–36.0)
MCV: 87.9 fL (ref 80.0–100.0)
Monocytes Absolute: 1.3 10*3/uL — ABNORMAL HIGH (ref 0.1–1.0)
Monocytes Relative: 6 %
Neutro Abs: 17.4 10*3/uL — ABNORMAL HIGH (ref 1.7–7.7)
Neutrophils Relative %: 84 %
Platelets: UNDETERMINED 10*3/uL (ref 150–400)
RBC: 4.05 MIL/uL (ref 3.87–5.11)
RDW: 13 % (ref 11.5–15.5)
WBC: 20.6 10*3/uL — ABNORMAL HIGH (ref 4.0–10.5)
nRBC: 0 % (ref 0.0–0.2)

## 2022-05-14 LAB — CBG MONITORING, ED
Glucose-Capillary: 122 mg/dL — ABNORMAL HIGH (ref 70–99)
Glucose-Capillary: 124 mg/dL — ABNORMAL HIGH (ref 70–99)
Glucose-Capillary: 127 mg/dL — ABNORMAL HIGH (ref 70–99)
Glucose-Capillary: 128 mg/dL — ABNORMAL HIGH (ref 70–99)
Glucose-Capillary: 131 mg/dL — ABNORMAL HIGH (ref 70–99)
Glucose-Capillary: 131 mg/dL — ABNORMAL HIGH (ref 70–99)
Glucose-Capillary: 135 mg/dL — ABNORMAL HIGH (ref 70–99)
Glucose-Capillary: 140 mg/dL — ABNORMAL HIGH (ref 70–99)
Glucose-Capillary: 145 mg/dL — ABNORMAL HIGH (ref 70–99)
Glucose-Capillary: 166 mg/dL — ABNORMAL HIGH (ref 70–99)
Glucose-Capillary: 221 mg/dL — ABNORMAL HIGH (ref 70–99)
Glucose-Capillary: 221 mg/dL — ABNORMAL HIGH (ref 70–99)
Glucose-Capillary: 227 mg/dL — ABNORMAL HIGH (ref 70–99)

## 2022-05-14 LAB — LACTIC ACID, PLASMA
Lactic Acid, Venous: 1.6 mmol/L (ref 0.5–1.9)
Lactic Acid, Venous: 2.4 mmol/L (ref 0.5–1.9)
Lactic Acid, Venous: 3.6 mmol/L (ref 0.5–1.9)
Lactic Acid, Venous: 5.2 mmol/L (ref 0.5–1.9)

## 2022-05-14 LAB — CK: Total CK: 49 U/L (ref 38–234)

## 2022-05-14 LAB — URINALYSIS, ROUTINE W REFLEX MICROSCOPIC
Bilirubin Urine: NEGATIVE
Glucose, UA: >=500 mg/dL — AB
Ketones, ur: NEGATIVE mg/dL
Nitrite: NEGATIVE
Protein, ur: NEGATIVE mg/dL
Specific Gravity, Urine: 1.022 (ref 1.005–1.030)
pH: 6 (ref 5.0–8.0)

## 2022-05-14 LAB — OSMOLALITY: Osmolality: 329 mOsm/kg (ref 275–295)

## 2022-05-14 LAB — AMMONIA: Ammonia: 16 umol/L (ref 9–35)

## 2022-05-14 LAB — TROPONIN I (HIGH SENSITIVITY)
Troponin I (High Sensitivity): 26 ng/L — ABNORMAL HIGH (ref <18)
Troponin I (High Sensitivity): 31 ng/L — ABNORMAL HIGH (ref <18)

## 2022-05-14 LAB — TSH: TSH: 0.964 u[IU]/mL (ref 0.350–4.500)

## 2022-05-14 LAB — PHOSPHORUS
Phosphorus: 2.5 mg/dL (ref 2.5–4.6)
Phosphorus: 2.7 mg/dL (ref 2.5–4.6)

## 2022-05-14 LAB — PREALBUMIN: Prealbumin: 19 mg/dL (ref 18–38)

## 2022-05-14 LAB — OSMOLALITY, URINE: Osmolality, Ur: 583 mOsm/kg (ref 300–900)

## 2022-05-14 MED ORDER — INSULIN ASPART 100 UNIT/ML IJ SOLN
0.0000 [IU] | Freq: Three times a day (TID) | INTRAMUSCULAR | Status: DC
  Administered 2022-05-14: 3 [IU] via SUBCUTANEOUS
  Administered 2022-05-14: 1 [IU] via SUBCUTANEOUS
  Administered 2022-05-15: 3 [IU] via SUBCUTANEOUS
  Administered 2022-05-15: 7 [IU] via SUBCUTANEOUS
  Administered 2022-05-15: 3 [IU] via SUBCUTANEOUS
  Administered 2022-05-16: 5 [IU] via SUBCUTANEOUS
  Administered 2022-05-16: 3 [IU] via SUBCUTANEOUS
  Administered 2022-05-16: 5 [IU] via SUBCUTANEOUS
  Administered 2022-05-17: 3 [IU] via SUBCUTANEOUS
  Administered 2022-05-17: 2 [IU] via SUBCUTANEOUS
  Administered 2022-05-17 – 2022-05-18 (×2): 3 [IU] via SUBCUTANEOUS
  Administered 2022-05-18: 2 [IU] via SUBCUTANEOUS
  Administered 2022-05-18: 5 [IU] via SUBCUTANEOUS
  Administered 2022-05-19: 2 [IU] via SUBCUTANEOUS
  Administered 2022-05-19: 1 [IU] via SUBCUTANEOUS
  Administered 2022-05-20 – 2022-05-21 (×3): 2 [IU] via SUBCUTANEOUS
  Administered 2022-05-21 – 2022-05-22 (×2): 1 [IU] via SUBCUTANEOUS
  Filled 2022-05-14: qty 0.09

## 2022-05-14 MED ORDER — INSULIN GLARGINE-YFGN 100 UNIT/ML ~~LOC~~ SOLN
15.0000 [IU] | Freq: Every day | SUBCUTANEOUS | Status: DC
  Administered 2022-05-14 – 2022-05-15 (×2): 15 [IU] via SUBCUTANEOUS
  Filled 2022-05-14 (×3): qty 0.15

## 2022-05-14 MED ORDER — LORATADINE 10 MG PO TABS
10.0000 mg | ORAL_TABLET | Freq: Every day | ORAL | Status: DC
  Administered 2022-05-14 – 2022-05-22 (×9): 10 mg via ORAL
  Filled 2022-05-14 (×9): qty 1

## 2022-05-14 MED ORDER — LACTATED RINGERS IV SOLN: INTRAVENOUS | Status: AC

## 2022-05-14 MED ORDER — SODIUM CHLORIDE 0.9 % IV BOLUS: 500.0000 mL | Freq: Once | INTRAVENOUS | Status: DC

## 2022-05-14 MED ORDER — NETARSUDIL-LATANOPROST 0.02-0.005 % OP SOLN
1.0000 [drp] | Freq: Every day | OPHTHALMIC | Status: DC
  Administered 2022-05-19 – 2022-05-21 (×3): 1 [drp] via OPHTHALMIC

## 2022-05-14 MED ORDER — ALBUMIN HUMAN 25 % IV SOLN
25.0000 g | Freq: Once | INTRAVENOUS | Status: AC
  Administered 2022-05-14: 25 g via INTRAVENOUS
  Filled 2022-05-14: qty 100

## 2022-05-14 MED ORDER — IPRATROPIUM-ALBUTEROL 0.5-2.5 (3) MG/3ML IN SOLN
3.0000 mL | Freq: Two times a day (BID) | RESPIRATORY_TRACT | Status: DC
  Administered 2022-05-14 – 2022-05-16 (×4): 3 mL via RESPIRATORY_TRACT
  Filled 2022-05-14 (×3): qty 3

## 2022-05-14 MED ORDER — FLUTICASONE PROPIONATE 50 MCG/ACT NA SUSP
2.0000 | Freq: Every day | NASAL | Status: DC
  Administered 2022-05-15 – 2022-05-22 (×8): 2 via NASAL
  Filled 2022-05-14: qty 16

## 2022-05-14 MED ORDER — GUAIFENESIN-DM 100-10 MG/5ML PO SYRP
10.0000 mL | ORAL_SOLUTION | ORAL | Status: DC | PRN
  Administered 2022-05-14 – 2022-05-19 (×14): 10 mL via ORAL
  Filled 2022-05-14 (×15): qty 10

## 2022-05-14 MED ORDER — GUAIFENESIN ER 600 MG PO TB12
1200.0000 mg | ORAL_TABLET | Freq: Two times a day (BID) | ORAL | Status: DC
  Administered 2022-05-14 – 2022-05-22 (×16): 1200 mg via ORAL
  Filled 2022-05-14 (×16): qty 2

## 2022-05-14 MED ORDER — GUAIFENESIN ER 600 MG PO TB12
600.0000 mg | ORAL_TABLET | Freq: Two times a day (BID) | ORAL | Status: DC
  Administered 2022-05-14 (×2): 600 mg via ORAL
  Filled 2022-05-14 (×2): qty 1

## 2022-05-14 MED ORDER — MAGNESIUM SULFATE 4 GM/100ML IV SOLN
4.0000 g | Freq: Once | INTRAVENOUS | Status: AC
  Administered 2022-05-14: 4 g via INTRAVENOUS
  Filled 2022-05-14: qty 100

## 2022-05-14 MED ORDER — ZINC SULFATE 220 (50 ZN) MG PO CAPS
220.0000 mg | ORAL_CAPSULE | Freq: Every day | ORAL | Status: DC
  Administered 2022-05-14 – 2022-05-22 (×9): 220 mg via ORAL
  Filled 2022-05-14 (×9): qty 1

## 2022-05-14 NOTE — Progress Notes (Signed)
ANTICOAGULATION CONSULT NOTE - Initial Consult  Pharmacy Consult for Xarelto Indication: DVT  Allergies  Allergen Reactions   Cephalexin Anaphylaxis, Swelling and Other (See Comments)   Micardis [Telmisartan] Other (See Comments)    Hyperkalemia during hospitalization with pulmonary edema   Atorvastatin Other (See Comments)    Other reaction(s): myalgia   Buspirone Other (See Comments)    Other reaction(s): "couldn't breath"   Canagliflozin Other (See Comments)    Pt does not recall   Doxycycline Hyclate Other (See Comments)    Other reaction(s): nausea   Lipitor [Atorvastatin Calcium] Other (See Comments)    myalgia   Tramadol Nausea Only   Tramadol Hcl Other (See Comments)    Patient Measurements:    Vital Signs: Temp: 98.1 F (36.7 C) (01/18 0200) Temp Source: Oral (01/17 1926) BP: 112/63 (01/18 0300) Pulse Rate: 81 (01/18 0300)  Labs: Recent Labs    05/13/22 2111 05/13/22 2344 05/14/22 0131  HGB 12.8  --   --   HCT 38.9  --   --   PLT PLATELET CLUMPS NOTED ON SMEAR, UNABLE TO ESTIMATE  --   --   CREATININE 1.32*  --   --   CKTOTAL  --  49  --   TROPONINIHS  --  26* 31*    Estimated Creatinine Clearance: 31.9 mL/min (A) (by C-G formula based on SCr of 1.32 mg/dL (H)).   Medical History: Past Medical History:  Diagnosis Date   Cellulitis and abscess of left leg    CHF (congestive heart failure) (HCC)    COPD (chronic obstructive pulmonary disease) ([place])    Diabetes mellitus    Glaucoma    Hyperlipidemia    Hypertension     Assessment: 89 yo F diagnosed with DVT and started on Eliquis, however pharmacy did not have in stock so hadn't filled after hospital discharge on 1/11.   She had f/u visit with PCP 1/17 and was given Xarelto starter pack which she started by taking 1st dose prior to presenting to ED.   Scr elevated due to HHS; anticipate renal function will improve with hydration.  No bleeding noted- Hg WNL, no bleeding noted.     Plan:   Xarelto '15mg'$  po BID x 20 more doses then '20mg'$  po daily with food. Monitor for s/sx of bleeding, renal function  Netta Cedars PharmD 05/14/2022,3:20 AM

## 2022-05-14 NOTE — Inpatient Diabetes Management (Signed)
Inpatient Diabetes Program Recommendations  AACE/ADA: New Consensus Statement on Inpatient Glycemic Control (2015)  Target Ranges:  Prepandial:   less than 140 mg/dL      Peak postprandial:   less than 180 mg/dL (1-2 hours)      Critically ill patients:  140 - 180 mg/dL   Lab Results  Component Value Date   GLUCAP 127 (H) 05/14/2022   HGBA1C 10.3 (H) 05/03/2022    Review of Glycemic Control  Diabetes history: DM2 Outpatient Diabetes medications: metformin 500 QD, Victoza 0.6 mg QD Current orders for Inpatient glycemic control: Semglee 15 QD, Novolog 0-9 units TID with meals  HgbA1C - 10.3%   Inpatient Diabetes Program Recommendations:    Agree with orders. If FBS < 120 mg/dL, reduce Semglee to 10 units QD  If post-prandials > 180 mg/dL, add Novolog 2 units TID with meals.  On no steroids at present.   May need small amount of basal insulin at discharge. Will speak with daughter and teach insulin pen administration if pt is to go home on insulin.   Continue to follow glucose trends.  Thank you. Lorenda Peck, RD, LDN, [place] Inpatient Diabetes Coordinator [phone]

## 2022-05-14 NOTE — Progress Notes (Addendum)
PROGRESS NOTE    [name]  JOA:[id number] DOB: [birth year] DOA: 05/13/2022 PCP: [name], DO    Chief Complaint  Patient presents with   Abnormal Lab    Brief Narrative: Patient is a 89 year old female history of hypertension, hyperlipidemia, chronic respiratory failure/COPD on 4 L nasal cannula, diastolic CHF, recently diagnosed bilateral lower extremity DVT, recent history of COVID, went to follow-up with PCP on day of admission, noted to have been unable to get her Eliquis for the past 6 days since discharge as pharmacy did not have it available, patient started on Xarelto per PCP.  Patient seen at PCPs office noted to have hyperkalemia given [facility], also noted to be hyperglycemic, noted to have some confusion and subsequently presented to the ED.  Patient seen in the ED placed on Endo tool/insulin drip.  Hyperkalemia corrected.  Hospitalist asked to admit the patient.   Assessment & Plan:   Principal Problem:   Hyperosmolar hyperglycemic state (HHS) ([place]) Active Problems:   AKI (acute kidney injury) ([place])   Chronic diastolic CHF (congestive heart failure) (HCC)   Diabetes mellitus ([place])   Essential hypertension   Chronic obstructive pulmonary disease, unspecified (HCC)   Obstructive sleep apnea syndrome   Chronic hypoxic respiratory failure (HCC)   History of DVT (deep vein thrombosis)   Elevated lactic acid level  #1 hyperosmolar hyperglycemic state/diabetes mellitus type 2 -Likely secondary to recent steroids in the setting of underlying diabetes mellitus. -Urinalysis done on admission with glycosuria, small hemoglobin, small leukocytes. -Basic metabolic profile done with a glucose of 861, sodium of 122, potassium of 6.0, chloride of 87, creatinine of 1.32, anion gap of 12, bicarb of 23.  Magnesium noted at 1.5.  Serum osmolality of 329, lactic acid level elevated at 5.2 and trended down. -CBC on admission with a white count of 19.2 otherwise within normal  limits. -Patient placed on Endo tool/insulin drip, blood glucose levels improved down to 113 this morning, anion gap of 6, CBG of 135 this morning. -Will transition to subcutaneous insulin placed on Semglee 15 units daily, SSI, placed on carb modified diet. -Patient noted to have completed steroids. -Hemoglobin A1c 10.3 (05/03/2022.) -Consult with diabetic coordinator.  2.  Hyperkalemia -Likely secondary to hyperosmolar state.   - Resolved.  3.  Acute kidney injury -Likely secondary to prerenal azotemia in the setting of hyperosmolar hyperglycemic state. -Renal function improved with hydration.  4.  Chronic diastolic CHF -Currently euvolemic on examination. -Monitor fluid status closely.  5.  Recently diagnosed bilateral DVT -Patient noted not to have been able to get Eliquis when recently discharged as the pharmacy did not have it.  Patient noted to have been out of anticoagulation for the past 6 to 7 days. -Patient started on Xarelto per PCP which we will continue.  6.  Elevated lactic acid level -Likely secondary to dehydration. -Improved with hydration.  7.  Hypertension -BP is noted to be soft on admission. -On gentle hydration. -Continue to hold antihypertensive medications.  8.  Chronic respiratory failure/COPD -On 4 L nasal cannula chronically at home currently stable. -Continue scheduled DuoNebs, Claritin, PPI, Mucinex, Flonase.  9.  OSA -Noted to be noncompliant with CPAP.    DVT prophylaxis: Xarelto Code Status: Full Family Communication: Updated patient and daughter on phone. Disposition: Likely home with home health when cleared medically stable.  Status is: Inpatient The patient will require care spanning > 2 midnights and should be moved to inpatient because: Severity of illness   Consultants:  None  Procedures:  CT head 05/13/2022 Chest x-ray 05/13/2022   Antimicrobials: None   Subjective: Sitting up in gurney in the ED.  Denies any chest  pain.  Denies any significant shortness of breath.  Noted to have a cough.  Denies any chest pain.  No abdominal pain.  Alert and oriented to self place and time.  Objective: Vitals:   05/14/22 0600 05/14/22 0630 05/14/22 0750 05/14/22 1015  BP: 113/63 110/62    Pulse: 72 82    Resp: 16 17    Temp:  97.8 F (36.6 C)  98.1 F (36.7 C)  TempSrc:  Oral  Oral  SpO2: 91% 94% 97%     Intake/Output Summary (Last 24 hours) at 05/14/2022 1103 Last data filed at 05/13/2022 2327 Gross per 24 hour  Intake 1025.88 ml  Output --  Net 1025.88 ml   There were no vitals filed for this visit.  Examination:  General exam: Appears calm and comfortable  Respiratory system: Some coarse breath sounds diffusely.  No wheezing.  Fair air movement.  Respiratory effort normal. Cardiovascular system: S1 & S2 heard, RRR. No JVD, murmurs, rubs, gallops or clicks. No pedal edema. Gastrointestinal system: Abdomen is nondistended, soft and nontender. No organomegaly or masses felt. Normal bowel sounds heard. Central nervous system: Alert and oriented. No focal neurological deficits. Extremities: Symmetric 5 x 5 power. Skin: No rashes, lesions or ulcers Psychiatry: Judgement and insight appear normal. Mood & affect appropriate.     Data Reviewed: I have personally reviewed following labs and imaging studies  CBC: Recent Labs  Lab 05/13/22 2111 05/14/22 0830  WBC 19.2* 20.6*  NEUTROABS 17.1* 17.4*  HGB 12.8 11.6*  HCT 38.9 35.6*  MCV 88.8 87.9  PLT PLATELET CLUMPS NOTED ON SMEAR, UNABLE TO ESTIMATE PLATELET CLUMPS NOTED ON SMEAR, UNABLE TO ESTIMATE    Basic Metabolic Panel: Recent Labs  Lab 05/13/22 2111 05/13/22 2344 05/14/22 0254 05/14/22 0300 05/14/22 0700  NA 122*  --   --  132* 135  K 6.0*  --   --  4.1 3.9  CL 87*  --   --  100 104  CO2 23  --   --  25 25  GLUCOSE 861*  --   --  141* 113*  BUN 67*  --   --  61* 51*  CREATININE 1.32*  --   --  1.13* 0.94  CALCIUM 9.8  --   --  8.9  8.2*  MG  --  1.5* 1.6*  --  1.6*  PHOS  --  2.5 2.7  --   --     GFR: Estimated Creatinine Clearance: 44.8 mL/min (by C-G formula based on SCr of 0.94 mg/dL).  Liver Function Tests: Recent Labs  Lab 05/13/22 2111  AST 21  ALT 24  ALKPHOS 150*  BILITOT 1.4*  PROT 6.8  ALBUMIN 3.7    CBG: Recent Labs  Lab 05/14/22 0635 05/14/22 0734 05/14/22 0848 05/14/22 0949 05/14/22 1053  GLUCAP 128* 124* 131* 135* 140*     Recent Results (from the past 240 hour(s))  Respiratory (~20 pathogens) panel by PCR     Status: None   Collection Time: 05/04/22  5:41 PM   Specimen: Nasopharyngeal Swab; Respiratory  Result Value Ref Range Status   Adenovirus NOT DETECTED NOT DETECTED Final   Coronavirus 229E NOT DETECTED NOT DETECTED Final    Comment: (NOTE) The Coronavirus on the Respiratory Panel, DOES NOT test for the novel  Coronavirus (2019  nCoV)    Coronavirus HKU1 NOT DETECTED NOT DETECTED Final   Coronavirus NL63 NOT DETECTED NOT DETECTED Final   Coronavirus OC43 NOT DETECTED NOT DETECTED Final   Metapneumovirus NOT DETECTED NOT DETECTED Final   Rhinovirus / Enterovirus NOT DETECTED NOT DETECTED Final   Influenza A NOT DETECTED NOT DETECTED Final   Influenza B NOT DETECTED NOT DETECTED Final   Parainfluenza Virus 1 NOT DETECTED NOT DETECTED Final   Parainfluenza Virus 2 NOT DETECTED NOT DETECTED Final   Parainfluenza Virus 3 NOT DETECTED NOT DETECTED Final   Parainfluenza Virus 4 NOT DETECTED NOT DETECTED Final   Respiratory Syncytial Virus NOT DETECTED NOT DETECTED Final   Bordetella pertussis NOT DETECTED NOT DETECTED Final   Bordetella Parapertussis NOT DETECTED NOT DETECTED Final   Chlamydophila pneumoniae NOT DETECTED NOT DETECTED Final   Mycoplasma pneumoniae NOT DETECTED NOT DETECTED Final    Comment: Performed at [facility], [place] [address] [place], [place] [address]         Radiology Studies: CT Head Wo Contrast  Result Date: 05/13/2022 CLINICAL  DATA:  Hyperkalemia, diabetes, mental status change EXAM: CT HEAD WITHOUT CONTRAST TECHNIQUE: Contiguous axial images were obtained from the base of the skull through the vertex without intravenous contrast. RADIATION DOSE REDUCTION: This exam was performed according to the departmental dose-optimization program which includes automated exposure control, adjustment of the mA and/or kV according to patient size and/or use of iterative reconstruction technique. COMPARISON:  10/12/2013 FINDINGS: Brain: No acute infarct or hemorrhage. Hypodensities in the periventricular white matter consistent with chronic small vessel ischemic change. The lateral ventricles and remaining midline structures are unremarkable. No acute extra-axial fluid collections. No mass effect. Vascular: Atherosclerosis of the internal carotid arteries. No hyperdense vessel. Skull: Normal. Negative for fracture or focal lesion. Sinuses/Orbits: Mucous retention cyst or polypoid mucosal thickening right posterior ethmoid air cell, stable. The remaining paranasal sinuses are clear. Other: None. IMPRESSION: 1. No acute intracranial process. Electronically Signed   By: Randa Ngo M.D.   On: 05/13/2022 21:31   DG Chest Portable 1 View  Result Date: 05/13/2022 CLINICAL DATA:  Altered mental status, weakness, shortness of breath, COVID. EXAM: PORTABLE CHEST 1 VIEW COMPARISON:  05/07/2022. FINDINGS: Heart is enlarged and the mediastinal contour stable. There is atherosclerotic calcification of the aorta. Lung volumes are low and there is mild atelectasis is present at the left lung base. No consolidation, effusion, or pneumothorax. Evidence of prior rotator cuff repair is noted on the right. Degenerative changes in the thoracic spine. No acute osseous abnormality. IMPRESSION: 1. Mild atelectasis at the left lung base. 2. Cardiomegaly. Electronically Signed   By: Brett Fairy M.D.   On: 05/13/2022 20:39        Scheduled Meds:  fluticasone  2  spray Each Nare Daily   guaiFENesin  600 mg Oral BID   insulin aspart  0-9 Units Subcutaneous TID WC   insulin glargine-yfgn  15 Units Subcutaneous Daily   ipratropium-albuterol  3 mL Nebulization BID   loratadine  10 mg Oral Daily   Netarsudil-Latanoprost  1 drop Both Eyes QHS   pantoprazole  40 mg Oral Daily   rivaroxaban  15 mg Oral BID   Followed by   Derrill Memo ON 05/23/2022] rivaroxaban  20 mg Oral Q supper   zinc sulfate  220 mg Oral Daily   Continuous Infusions:  sodium chloride 75 mL/hr at 05/14/22 0027   insulin 0.7 Units/hr (05/14/22 0851)   lactated ringers  magnesium sulfate bolus IVPB     sodium chloride       LOS: 0 days    Time spent: 40 minutes    Irine Seal, MD [facility]   To contact the attending provider between 7A-7P or the covering provider during after hours 7P-7A, please log into the web site [email or web address] and access using universal [place] password for that web site. If you do not have the password, please call the hospital operator.  05/14/2022, 11:03 AM

## 2022-05-14 NOTE — Assessment & Plan Note (Signed)
Will rehydrate and recheck, BP stable pt states she is doing better

## 2022-05-14 NOTE — Progress Notes (Signed)
PT Cancellation Note  Patient Details Name: [name] MRN: [id number] DOB: 1930-09-20   Cancelled Treatment:    Reason Eval/Treat Not Completed: Fatigue/lethargy limiting ability to participate. Family present at bedside, report pt fell asleep at 4 am. Pt difficult to arouse, will hold PT eval until appropriate.   Talbot Grumbling PT, DPT 05/14/22, 1:36 PM

## 2022-05-15 ENCOUNTER — Other Ambulatory Visit (HOSPITAL_COMMUNITY): Payer: Self-pay

## 2022-05-15 ENCOUNTER — Inpatient Hospital Stay (HOSPITAL_COMMUNITY): Payer: Medicare Other

## 2022-05-15 DIAGNOSIS — J9611 Chronic respiratory failure with hypoxia: Secondary | ICD-10-CM | POA: Diagnosis not present

## 2022-05-15 DIAGNOSIS — E1100 Type 2 diabetes mellitus with hyperosmolarity without nonketotic hyperglycemic-hyperosmolar coma (NKHHC): Secondary | ICD-10-CM | POA: Diagnosis not present

## 2022-05-15 DIAGNOSIS — I5032 Chronic diastolic (congestive) heart failure: Secondary | ICD-10-CM | POA: Diagnosis not present

## 2022-05-15 DIAGNOSIS — J209 Acute bronchitis, unspecified: Secondary | ICD-10-CM | POA: Diagnosis present

## 2022-05-15 DIAGNOSIS — N179 Acute kidney failure, unspecified: Secondary | ICD-10-CM | POA: Diagnosis not present

## 2022-05-15 LAB — CBG MONITORING, ED
Glucose-Capillary: 244 mg/dL — ABNORMAL HIGH (ref 70–99)
Glucose-Capillary: 283 mg/dL — ABNORMAL HIGH (ref 70–99)

## 2022-05-15 LAB — CBC WITH DIFFERENTIAL/PLATELET
Abs Immature Granulocytes: 0.09 10*3/uL — ABNORMAL HIGH (ref 0.00–0.07)
Basophils Absolute: 0 10*3/uL (ref 0.0–0.1)
Basophils Relative: 0 %
Eosinophils Absolute: 0 10*3/uL (ref 0.0–0.5)
Eosinophils Relative: 0 %
HCT: 31.9 % — ABNORMAL LOW (ref 36.0–46.0)
Hemoglobin: 10.4 g/dL — ABNORMAL LOW (ref 12.0–15.0)
Immature Granulocytes: 1 %
Lymphocytes Relative: 5 %
Lymphs Abs: 0.9 10*3/uL (ref 0.7–4.0)
MCH: 29 pg (ref 26.0–34.0)
MCHC: 32.6 g/dL (ref 30.0–36.0)
MCV: 88.9 fL (ref 80.0–100.0)
Monocytes Absolute: 0.9 10*3/uL (ref 0.1–1.0)
Monocytes Relative: 5 %
Neutro Abs: 17.7 10*3/uL — ABNORMAL HIGH (ref 1.7–7.7)
Neutrophils Relative %: 89 %
Platelets: UNDETERMINED 10*3/uL (ref 150–400)
RBC: 3.59 MIL/uL — ABNORMAL LOW (ref 3.87–5.11)
RDW: 13.2 % (ref 11.5–15.5)
WBC: 19.6 10*3/uL — ABNORMAL HIGH (ref 4.0–10.5)
nRBC: 0 % (ref 0.0–0.2)

## 2022-05-15 LAB — RENAL FUNCTION PANEL
Albumin: 3 g/dL — ABNORMAL LOW (ref 3.5–5.0)
Anion gap: 10 (ref 5–15)
BUN: 34 mg/dL — ABNORMAL HIGH (ref 8–23)
CO2: 26 mmol/L (ref 22–32)
Calcium: 8.6 mg/dL — ABNORMAL LOW (ref 8.9–10.3)
Chloride: 95 mmol/L — ABNORMAL LOW (ref 98–111)
Creatinine, Ser: 0.91 mg/dL (ref 0.44–1.00)
GFR, Estimated: >60 mL/min (ref >60)
Glucose, Bld: 246 mg/dL — ABNORMAL HIGH (ref 70–99)
Phosphorus: 2.5 mg/dL (ref 2.5–4.6)
Potassium: 4.2 mmol/L (ref 3.5–5.1)
Sodium: 131 mmol/L — ABNORMAL LOW (ref 135–145)

## 2022-05-15 LAB — GLUCOSE, CAPILLARY
Glucose-Capillary: 328 mg/dL — ABNORMAL HIGH (ref 70–99)
Glucose-Capillary: 211 mg/dL — ABNORMAL HIGH (ref 70–99)
Glucose-Capillary: 204 mg/dL — ABNORMAL HIGH (ref 70–99)

## 2022-05-15 LAB — MAGNESIUM: Magnesium: 2 mg/dL (ref 1.7–2.4)

## 2022-05-15 MED ORDER — INSULIN GLARGINE-YFGN 100 UNIT/ML ~~LOC~~ SOLN
3.0000 [IU] | Freq: Once | SUBCUTANEOUS | Status: AC
  Administered 2022-05-15: 3 [IU] via SUBCUTANEOUS
  Filled 2022-05-15: qty 0.03

## 2022-05-15 MED ORDER — SODIUM CHLORIDE 0.9 % IV SOLN
500.0000 mg | Freq: Every day | INTRAVENOUS | Status: DC
  Administered 2022-05-15: 500 mg via INTRAVENOUS
  Filled 2022-05-15 (×2): qty 5

## 2022-05-15 MED ORDER — INSULIN GLARGINE-YFGN 100 UNIT/ML ~~LOC~~ SOLN
18.0000 [IU] | Freq: Every day | SUBCUTANEOUS | Status: DC
  Filled 2022-05-15: qty 0.18

## 2022-05-15 NOTE — Inpatient Diabetes Management (Signed)
Inpatient Diabetes Program Recommendations  AACE/ADA: New Consensus Statement on Inpatient Glycemic Control (2015)  Target Ranges:  Prepandial:   less than 140 mg/dL      Peak postprandial:   less than 180 mg/dL (1-2 hours)      Critically ill patients:  140 - 180 mg/dL   Lab Results  Component Value Date   GLUCAP 328 (H) 05/15/2022   HGBA1C 10.3 (H) 05/03/2022    Review of Glycemic Control  Diabetes history: DM2 Outpatient Diabetes medications: metformin 500 QD, Victoza 0.6 mg QD, Tresiba 15 units QD Current orders for Inpatient glycemic control: Semglee 18 units QD, Novolog 0-9 units TID with meals  HgbA1C - 10.3%  Inpatient Diabetes Program Recommendations:    Consider adding Novolog 2-3 units TID with meals if eating > 50%  Spoke with daughter Scottie, regarding giving pt insulin at home. Scottie states her mother's PCP just ordered Tresiba 15 units QD on 1/17 and she "went through all the training" although has not given her insulin yet since pt was admitted. Discussed hypoglycemia s/s and treatment and daughter had no other questions/concerns.   Thank you. Lorenda Peck, RD, LDN, [place] Inpatient Diabetes Coordinator [phone]

## 2022-05-15 NOTE — Evaluation (Signed)
Occupational Therapy Evaluation Patient Details Name: [name] MRN: [id number] DOB: 16-Sep-1930 Today's Date: 05/15/2022   History of Present Illness Patient is a pleasant 89 year old female with history of chronic respiratory failure secondary to COPD on chronic home O2 4 L nasal cannula, type 2 diabetes, CKD stage IIIb, glaucoma, hypertension, hyperlipidemia presented with a 1 week history of cough shortness of breath. Patient admitted for COVID-19 infection as well as an acute COPD exacerbation.   Clinical Impression   [name] is an 89 year old woman who presents with decreased activity tolerance and impaired cardiopulmonary endurance. On evaluation - she is found in the ED on a stretcher. She was supervision to transfer to edge of bed, min guard to take steps at side of stretcher. Evaluation limited by presence of nursing giving medication. She was found without oxygen donned and o2 sat wavered between 88-90% and therapist encouraged to wear oxygen for now. Patient sounding hoarse with talking. She is overall min guard to min assist for mobility and Adls. Suspect she will recover well in hospital and not need OT services at discharges. Will follow acutely.       Recommendations for follow up therapy are one component of a multi-disciplinary discharge planning process, led by the attending physician.  Recommendations may be updated based on patient status, additional functional criteria and insurance authorization.   Follow Up Recommendations  No OT follow up     Assistance Recommended at Discharge PRN  Patient can return home with the following Assistance with cooking/housework;Help with stairs or ramp for entrance    Functional Status Assessment  Patient has had a recent decline in their functional status and demonstrates the ability to make significant improvements in function in a reasonable and predictable amount of time.  Equipment Recommendations  None recommended  by OT    Recommendations for Other Services       Precautions / Restrictions Precautions Precautions: Other (comment) Precaution Comments: monitor o2 Restrictions Weight Bearing Restrictions: No      Mobility Bed Mobility Overal bed mobility: Needs Assistance Bed Mobility: Supine to Sit, Sit to Supine     Supine to sit: Supervision Sit to supine: Min assist   General bed mobility comments: Patietn limited by being on stretcher. O2 sat 88-90% on RA. Placed oxygen back on patient. She reports she doesn't wear it during the day.    Transfers Overall transfer level: Needs assistance   Transfers: Sit to/from Stand, Bed to chair/wheelchair/BSC Sit to Stand: Min guard     Step pivot transfers: Min guard     General transfer comment: Able to stand and take steps near head of bed.      Balance Overall balance assessment: Mild deficits observed, not formally tested                                         ADL either performed or assessed with clinical judgement   ADL Overall ADL's : Needs assistance/impaired Eating/Feeding: Set up;Sitting   Grooming: Set up;Sitting   Upper Body Bathing: Set up;Sitting   Lower Body Bathing: Min guard;Sit to/from stand   Upper Body Dressing : Set up;Sitting   Lower Body Dressing: Min guard;Sit to/from stand   Toilet Transfer: Min guard;BSC/3in1   Toileting- Water quality scientist and Hygiene: Min guard;Sit to/from stand       Functional mobility during ADLs: Min guard General ADL  Comments: Mobility limited to just edge of bed as nursing came into room and interrupted evaluation with medications. patinet able to stand and take steps to head of bed with a hand hold.     Vision Patient Visual Report: No change from baseline       Perception     Praxis      Pertinent Vitals/Pain Pain Assessment Pain Assessment: No/denies pain     Hand Dominance Right   Extremity/Trunk Assessment Upper Extremity  Assessment Upper Extremity Assessment: Overall WFL for tasks assessed   Lower Extremity Assessment Lower Extremity Assessment: Defer to PT evaluation   Cervical / Trunk Assessment Cervical / Trunk Assessment: Normal   Communication Communication Communication: HOH   Cognition Arousal/Alertness: Awake/alert Behavior During Therapy: WFL for tasks assessed/performed Overall Cognitive Status: Within Functional Limits for tasks assessed                                 General Comments: very [facility]     General Comments       Exercises     Shoulder Instructions      Home Living Family/patient expects to be discharged to:: Private residence Living Arrangements: Spouse/significant other;Children Available Help at Discharge: Family;Available 24 hours/day Type of Home: House Home Access: Ramped entrance     Home Layout: One level     Bathroom Shower/Tub: Tub/shower unit         Home Equipment: Cane - single point;Rollator (4 wheels)   Additional Comments: daughter helps her as needed. wears oxygen at night      Prior Functioning/Environment Prior Level of Function : Independent/Modified Independent             Mobility Comments: no device ADLs Comments: independent, still cooks.        OT Problem List: Cardiopulmonary status limiting activity;Decreased activity tolerance      OT Treatment/Interventions: Self-care/ADL training;Therapeutic exercise;DME and/or AE instruction;Therapeutic activities;Energy conservation;Patient/family education    OT Goals(Current goals can be found in the care plan section) Acute Rehab OT Goals OT Goal Formulation: With patient Time For Goal Achievement: 05/29/22 Potential to Achieve Goals: Good  OT Frequency: Min 2X/week    Co-evaluation              AM-PAC OT "6 Clicks" Daily Activity     Outcome Measure Help from another person eating meals?: None Help from another person taking care of personal  grooming?: A Little Help from another person toileting, which includes using toliet, bedpan, or urinal?: A Little Help from another person bathing (including washing, rinsing, drying)?: A Little Help from another person to put on and taking off regular upper body clothing?: A Little Help from another person to put on and taking off regular lower body clothing?: A Little 6 Click Score: 19   End of Session Equipment Utilized During Treatment: Oxygen Nurse Communication: Mobility status  Activity Tolerance: Patient tolerated treatment well Patient left: in bed;with call bell/phone within reach;with nursing/sitter in room  OT Visit Diagnosis: Muscle weakness (generalized) (M62.81)                Time: 5993-5701 OT Time Calculation (min): 10 min Charges:  OT General Charges $OT Visit: 1 Visit OT Evaluation $OT Eval Low Complexity: 1 Low  Gustavo Lah, OTR/L [place]  Office [phone]   Lenward Chancellor 05/15/2022, 9:52 AM

## 2022-05-15 NOTE — Progress Notes (Addendum)
PROGRESS NOTE    [name]  UYQ:[id number] DOB: 1930/07/07 DOA: 05/13/2022 PCP: [name], DO    Chief Complaint  Patient presents with   Abnormal Lab    Brief Narrative: Patient is a 89 year old female history of hypertension, hyperlipidemia, chronic respiratory failure/COPD on 4 L nasal cannula, diastolic CHF, recently diagnosed bilateral lower extremity DVT, recent history of COVID, went to follow-up with PCP on day of admission, noted to have been unable to get her Eliquis for the past 6 days since discharge as pharmacy did not have it available, patient started on Xarelto per PCP.  Patient seen at PCPs office noted to have hyperkalemia given [facility], also noted to be hyperglycemic, noted to have some confusion and subsequently presented to the ED.  Patient seen in the ED placed on Endo tool/insulin drip.  Hyperkalemia corrected.  Hospitalist asked to admit the patient.   Assessment & Plan:   Principal Problem:   Hyperosmolar hyperglycemic state (HHS) ([place]) Active Problems:   AKI (acute kidney injury) ([place])   Chronic diastolic CHF (congestive heart failure) (HCC)   Diabetes mellitus ([place])   Essential hypertension   Chronic obstructive pulmonary disease, unspecified (HCC)   Obstructive sleep apnea syndrome   Chronic hypoxic respiratory failure (HCC)   History of DVT (deep vein thrombosis)   Elevated lactic acid level   Hyperkalemia   Acute bronchitis  #1 hyperosmolar hyperglycemic state/diabetes mellitus type 2 -Likely secondary to recent steroids in the setting of underlying diabetes mellitus. -Urinalysis done on admission with glycosuria, small hemoglobin, small leukocytes. -Basic metabolic profile done with a glucose of 861, sodium of 122, potassium of 6.0, chloride of 87, creatinine of 1.32, anion gap of 12, bicarb of 23.  Magnesium noted at 1.5.  Serum osmolality of 329, lactic acid level elevated at 5.2 and trended down. -CBC on admission with a white count of  19.2 otherwise within normal limits. -Patient placed on Endo tool/insulin drip, blood glucose levels improved, normal anion gap, patient subsequently transition to subcutaneous insulin and SSI. -Hemoglobin A1c 10.3 (05/03/2022). -CBG at 244 this morning. -Increase Semglee to 18 units daily, SSI, carb modified diet.   -Diabetes coordinator following.   2.  Hyperkalemia -Likely secondary to hyperosmolar state.  -Resolved.  Potassium of 4.2.  3.  Acute kidney injury -Likely secondary to prerenal azotemia in the setting of hyperosmolar hyperglycemic state. -Renal function improved with hydration.  4.  Chronic diastolic CHF -Currently euvolemic on examination. -Gentle hydration.   5.  Recently diagnosed bilateral DVT -Patient noted not to have been able to get Eliquis when recently discharged as the pharmacy did not have it.  Patient noted to have been out of anticoagulation for the past 6 to 7 days as was unable to fill it as pharmacy did not have it on discharge during last hospitalization. -Patient started on Xarelto per PCP. -Continue Xarelto.   6.  Elevated lactic acid level -Likely secondary to dehydration. -Improved with hydration.  7.  Hypertension -BP is noted to be soft on admission. -On gentle hydration. -Continue to hold antihypertensive medications.  8.  Chronic respiratory failure/COPD/probable acute bronchitis -On 4 L nasal cannula chronically at home currently stable. -Continue scheduled DuoNebs, Claritin, PPI, Mucinex, Flonase. -Patient with ongoing cough, repeat chest x-ray done with mild basilar opacities likely atelectasis.   -Patient with recent COVID, COPD, patient with a cough, will place empirically on azithromycin to treat for probable acute bronchitis.    9.  OSA -Noted to be noncompliant with  CPAP.    DVT prophylaxis: Xarelto Code Status: Full Family Communication: Updated patient and daughter at bedside. Disposition: Likely home with home health  when cleared medically stable hopefully in the next 24 hours.  Status is: Inpatient The patient will require care spanning > 2 midnights and should be moved to inpatient because: Severity of illness   Consultants:  None  Procedures:  CT head 05/13/2022 Chest x-ray 05/13/2022, 05/15/2022   Antimicrobials: Azithromycin 05/15/2022>>>   Subjective: Patient laying in [place].  Having a cough.  Denies any chest pain.  Denies any significant shortness of breath.  Daughter at bedside.  Tolerating current diet.   Objective: Vitals:   05/15/22 1051 05/15/22 1130 05/15/22 1159 05/15/22 1408  BP: (!) 125/59 (!) 105/57  115/66  Pulse: (!) 119 91  91  Resp: '18 17  18  '$ Temp:   98 F (36.7 C) 98.1 F (36.7 C)  TempSrc:   Oral Oral  SpO2: 94% 91%  93%    Intake/Output Summary (Last 24 hours) at 05/15/2022 1714 Last data filed at 05/15/2022 0600 Gross per 24 hour  Intake 1135.15 ml  Output 400 ml  Net 735.15 ml   There were no vitals filed for this visit.  Examination:  General exam: NAD. Respiratory system: Diffuse coarse breath sounds.  No wheezing.  Fair air movement.  Normal respiratory effort.  Cardiovascular system: Regular rate rhythm no murmurs rubs or gallops.  No JVD.  No lower extremity edema.  Gastrointestinal system: Abdomen is soft, nontender, nondistended, positive bowel sounds.  No rebound.  No guarding.   Central nervous system: Alert and oriented. No focal neurological deficits. Extremities: Symmetric 5 x 5 power. Skin: No rashes, lesions or ulcers Psychiatry: Judgement and insight appear normal. Mood & affect appropriate.     Data Reviewed: I have personally reviewed following labs and imaging studies  CBC: Recent Labs  Lab 05/13/22 2111 05/14/22 0830 05/15/22 0352  WBC 19.2* 20.6* 19.6*  NEUTROABS 17.1* 17.4* 17.7*  HGB 12.8 11.6* 10.4*  HCT 38.9 35.6* 31.9*  MCV 88.8 87.9 88.9  PLT PLATELET CLUMPS NOTED ON SMEAR, UNABLE TO ESTIMATE PLATELET CLUMPS NOTED  ON SMEAR, UNABLE TO ESTIMATE PLATELET CLUMPS NOTED ON SMEAR, UNABLE TO ESTIMATE    Basic Metabolic Panel: Recent Labs  Lab 05/13/22 2111 05/13/22 2344 05/14/22 0254 05/14/22 0300 05/14/22 0700 05/15/22 0352  NA 122*  --   --  132* 135 131*  K 6.0*  --   --  4.1 3.9 4.2  CL 87*  --   --  100 104 95*  CO2 23  --   --  '25 25 26  '$ GLUCOSE 861*  --   --  141* 113* 246*  BUN 67*  --   --  61* 51* 34*  CREATININE 1.32*  --   --  1.13* 0.94 0.91  CALCIUM 9.8  --   --  8.9 8.2* 8.6*  MG  --  1.5* 1.6*  --  1.6* 2.0  PHOS  --  2.5 2.7  --   --  2.5    GFR: Estimated Creatinine Clearance: 46.3 mL/min (by C-G formula based on SCr of 0.91 mg/dL).  Liver Function Tests: Recent Labs  Lab 05/13/22 2111 05/15/22 0352  AST 21  --   ALT 24  --   ALKPHOS 150*  --   BILITOT 1.4*  --   PROT 6.8  --   ALBUMIN 3.7 3.0*    CBG: Recent Labs  Lab 05/14/22  1802 05/14/22 2110 05/15/22 0920 05/15/22 1217 05/15/22 1434  GLUCAP 227* 221* 244* 283* 328*     No results found for this or any previous visit (from the past 240 hour(s)).        Radiology Studies: DG CHEST PORT 1 VIEW  Result Date: 05/15/2022 CLINICAL DATA:  Cough EXAM: PORTABLE CHEST 1 VIEW COMPARISON:  Chest x-ray dated May 13, 2022 FINDINGS: Cardiac and mediastinal contours are unchanged. Mild bibasilar opacities, likely atelectasis. No large pleural effusion no evidence of pneumothorax. IMPRESSION: Mild bibasilar opacities, likely atelectasis. Electronically Signed   By: Yetta Glassman M.D.   On: 05/15/2022 08:12   CT Head Wo Contrast  Result Date: 05/13/2022 CLINICAL DATA:  Hyperkalemia, diabetes, mental status change EXAM: CT HEAD WITHOUT CONTRAST TECHNIQUE: Contiguous axial images were obtained from the base of the skull through the vertex without intravenous contrast. RADIATION DOSE REDUCTION: This exam was performed according to the departmental dose-optimization program which includes automated exposure  control, adjustment of the mA and/or kV according to patient size and/or use of iterative reconstruction technique. COMPARISON:  10/12/2013 FINDINGS: Brain: No acute infarct or hemorrhage. Hypodensities in the periventricular white matter consistent with chronic small vessel ischemic change. The lateral ventricles and remaining midline structures are unremarkable. No acute extra-axial fluid collections. No mass effect. Vascular: Atherosclerosis of the internal carotid arteries. No hyperdense vessel. Skull: Normal. Negative for fracture or focal lesion. Sinuses/Orbits: Mucous retention cyst or polypoid mucosal thickening right posterior ethmoid air cell, stable. The remaining paranasal sinuses are clear. Other: None. IMPRESSION: 1. No acute intracranial process. Electronically Signed   By: Randa Ngo M.D.   On: 05/13/2022 21:31   DG Chest Portable 1 View  Result Date: 05/13/2022 CLINICAL DATA:  Altered mental status, weakness, shortness of breath, COVID. EXAM: PORTABLE CHEST 1 VIEW COMPARISON:  05/07/2022. FINDINGS: Heart is enlarged and the mediastinal contour stable. There is atherosclerotic calcification of the aorta. Lung volumes are low and there is mild atelectasis is present at the left lung base. No consolidation, effusion, or pneumothorax. Evidence of prior rotator cuff repair is noted on the right. Degenerative changes in the thoracic spine. No acute osseous abnormality. IMPRESSION: 1. Mild atelectasis at the left lung base. 2. Cardiomegaly. Electronically Signed   By: Brett Fairy M.D.   On: 05/13/2022 20:39        Scheduled Meds:  fluticasone  2 spray Each Nare Daily   guaiFENesin  1,200 mg Oral BID   insulin aspart  0-9 Units Subcutaneous TID WC   [START ON 05/16/2022] insulin glargine-yfgn  18 Units Subcutaneous Daily   ipratropium-albuterol  3 mL Nebulization BID   loratadine  10 mg Oral Daily   Netarsudil-Latanoprost  1 drop Both Eyes QHS   pantoprazole  40 mg Oral Daily    rivaroxaban  15 mg Oral BID   Followed by   Derrill Memo ON 05/23/2022] rivaroxaban  20 mg Oral Q supper   zinc sulfate  220 mg Oral Daily   Continuous Infusions:  azithromycin 500 mg (05/15/22 1229)   lactated ringers 50 mL/hr at 05/15/22 1434   sodium chloride       LOS: 1 day    Time spent: 40 minutes    Irine Seal, MD [facility]   To contact the attending provider between 7A-7P or the covering provider during after hours 7P-7A, please log into the web site [email or web address] and access using universal [place] password for that web site. If you do not have the  password, please call the hospital operator.  05/15/2022, 5:14 PM

## 2022-05-16 DIAGNOSIS — J9611 Chronic respiratory failure with hypoxia: Secondary | ICD-10-CM | POA: Diagnosis not present

## 2022-05-16 DIAGNOSIS — N179 Acute kidney failure, unspecified: Secondary | ICD-10-CM | POA: Diagnosis not present

## 2022-05-16 DIAGNOSIS — L8990 Pressure ulcer of unspecified site, unspecified stage: Secondary | ICD-10-CM | POA: Insufficient documentation

## 2022-05-16 DIAGNOSIS — E1100 Type 2 diabetes mellitus with hyperosmolarity without nonketotic hyperglycemic-hyperosmolar coma (NKHHC): Secondary | ICD-10-CM | POA: Diagnosis not present

## 2022-05-16 DIAGNOSIS — I5032 Chronic diastolic (congestive) heart failure: Secondary | ICD-10-CM | POA: Diagnosis not present

## 2022-05-16 DIAGNOSIS — N390 Urinary tract infection, site not specified: Secondary | ICD-10-CM | POA: Insufficient documentation

## 2022-05-16 LAB — BASIC METABOLIC PANEL
Anion gap: 6 (ref 5–15)
BUN: 27 mg/dL — ABNORMAL HIGH (ref 8–23)
CO2: 28 mmol/L (ref 22–32)
Calcium: 8.5 mg/dL — ABNORMAL LOW (ref 8.9–10.3)
Chloride: 98 mmol/L (ref 98–111)
Creatinine, Ser: 0.86 mg/dL (ref 0.44–1.00)
GFR, Estimated: >60 mL/min (ref >60)
Glucose, Bld: 214 mg/dL — ABNORMAL HIGH (ref 70–99)
Potassium: 3.8 mmol/L (ref 3.5–5.1)
Sodium: 132 mmol/L — ABNORMAL LOW (ref 135–145)

## 2022-05-16 LAB — CBC WITH DIFFERENTIAL/PLATELET
Abs Immature Granulocytes: 0.09 10*3/uL — ABNORMAL HIGH (ref 0.00–0.07)
Basophils Absolute: 0 10*3/uL (ref 0.0–0.1)
Basophils Relative: 0 %
Eosinophils Absolute: 0.1 10*3/uL (ref 0.0–0.5)
Eosinophils Relative: 0 %
HCT: 34.3 % — ABNORMAL LOW (ref 36.0–46.0)
Hemoglobin: 10.9 g/dL — ABNORMAL LOW (ref 12.0–15.0)
Immature Granulocytes: 1 %
Lymphocytes Relative: 6 %
Lymphs Abs: 0.9 10*3/uL (ref 0.7–4.0)
MCH: 28.7 pg (ref 26.0–34.0)
MCHC: 31.8 g/dL (ref 30.0–36.0)
MCV: 90.3 fL (ref 80.0–100.0)
Monocytes Absolute: 0.8 10*3/uL (ref 0.1–1.0)
Monocytes Relative: 5 %
Neutro Abs: 14.2 10*3/uL — ABNORMAL HIGH (ref 1.7–7.7)
Neutrophils Relative %: 88 %
Platelets: UNDETERMINED 10*3/uL (ref 150–400)
RBC: 3.8 MIL/uL — ABNORMAL LOW (ref 3.87–5.11)
RDW: 13.3 % (ref 11.5–15.5)
WBC: 16.1 10*3/uL — ABNORMAL HIGH (ref 4.0–10.5)
nRBC: 0 % (ref 0.0–0.2)

## 2022-05-16 LAB — GLUCOSE, CAPILLARY
Glucose-Capillary: 230 mg/dL — ABNORMAL HIGH (ref 70–99)
Glucose-Capillary: 253 mg/dL — ABNORMAL HIGH (ref 70–99)
Glucose-Capillary: 209 mg/dL — ABNORMAL HIGH (ref 70–99)
Glucose-Capillary: 186 mg/dL — ABNORMAL HIGH (ref 70–99)

## 2022-05-16 MED ORDER — IPRATROPIUM-ALBUTEROL 0.5-2.5 (3) MG/3ML IN SOLN
3.0000 mL | RESPIRATORY_TRACT | Status: DC | PRN
  Administered 2022-05-18 – 2022-05-19 (×3): 3 mL via RESPIRATORY_TRACT
  Filled 2022-05-16 (×3): qty 3

## 2022-05-16 MED ORDER — AZITHROMYCIN 250 MG PO TABS
250.0000 mg | ORAL_TABLET | Freq: Every day | ORAL | Status: AC
  Administered 2022-05-16 – 2022-05-19 (×4): 250 mg via ORAL
  Filled 2022-05-16 (×4): qty 1

## 2022-05-16 MED ORDER — INSULIN GLARGINE-YFGN 100 UNIT/ML ~~LOC~~ SOLN
20.0000 [IU] | Freq: Every day | SUBCUTANEOUS | Status: DC
  Administered 2022-05-16 – 2022-05-21 (×6): 20 [IU] via SUBCUTANEOUS
  Filled 2022-05-16 (×7): qty 0.2

## 2022-05-16 MED ORDER — FOSFOMYCIN TROMETHAMINE 3 G PO PACK
3.0000 g | PACK | Freq: Once | ORAL | Status: AC
  Administered 2022-05-16: 3 g via ORAL
  Filled 2022-05-16: qty 3

## 2022-05-16 NOTE — Progress Notes (Signed)
PROGRESS NOTE    [name]  OEV:[id number] DOB: January 16, 1931 DOA: 05/13/2022 PCP: [name], DO    Chief Complaint  Patient presents with   Abnormal Lab    Brief Narrative: Patient is a 89 year old female history of hypertension, hyperlipidemia, chronic respiratory failure/COPD on 4 L nasal cannula, diastolic CHF, recently diagnosed bilateral lower extremity DVT, recent history of COVID, went to follow-up with PCP on day of admission, noted to have been unable to get her Eliquis for the past 6 days since discharge as pharmacy did not have it available, patient started on Xarelto per PCP.  Patient seen at PCPs office noted to have hyperkalemia given [facility], also noted to be hyperglycemic, noted to have some confusion and subsequently presented to the ED.  Patient seen in the ED placed on Endo tool/insulin drip.  Hyperkalemia corrected.  Hospitalist asked to admit the patient.   Assessment & Plan:   Principal Problem:   Hyperosmolar hyperglycemic state (HHS) ([place]) Active Problems:   AKI (acute kidney injury) ([place])   Chronic diastolic CHF (congestive heart failure) (HCC)   Diabetes mellitus ([place])   Essential hypertension   Chronic obstructive pulmonary disease, unspecified (HCC)   Obstructive sleep apnea syndrome   Chronic hypoxic respiratory failure (HCC)   History of DVT (deep vein thrombosis)   Elevated lactic acid level   Hyperkalemia   Acute bronchitis   UTI (urinary tract infection)   Pressure injury of skin  #1 hyperosmolar hyperglycemic state/diabetes mellitus type 2 -Likely secondary to recent steroids in the setting of underlying diabetes mellitus. -Urinalysis done on admission with glycosuria, small hemoglobin, small leukocytes. -Basic metabolic profile done with a glucose of 861, sodium of 122, potassium of 6.0, chloride of 87, creatinine of 1.32, anion gap of 12, bicarb of 23.  Magnesium noted at 1.5.  Serum osmolality of 329, lactic acid level elevated at 5.2  and trended down. -CBC on admission with a white count of 19.2 otherwise within normal limits. -Patient placed on Endo tool/insulin drip, blood glucose levels improved, normal anion gap, patient subsequently transitioned to subcutaneous insulin and SSI. -Hemoglobin A1c 10.3 (05/03/2022). -CBG at 230 this morning. -Increase Semglee to 20 units daily, SSI, carb modified diet.   -Diabetes coordinator following.   2.  Hyperkalemia -Likely secondary to hyperosmolar state.  -Potassium at 3.8. -Repeat labs in the AM.  3.  Acute kidney injury -Likely secondary to prerenal azotemia in the setting of hyperosmolar hyperglycemic state. -Renal function improved with hydration.  4.  Chronic diastolic CHF -Currently euvolemic on examination. -Saline lock IV fluids.    5.  Recently diagnosed bilateral DVT -Patient noted not to have been able to get Eliquis when recently discharged as the pharmacy did not have it.  Patient noted to have been out of anticoagulation for the past 6 to 7 days as was unable to fill it as pharmacy did not have it on discharge during last hospitalization. -Patient started on Xarelto per PCP. -Continue Xarelto.   6.  Elevated lactic acid level -Likely secondary to dehydration. -Improved with hydration.  7.  Hypertension -BP is noted to be soft on admission. -On gentle hydration. -Continue to hold antihypertensive medications. -Saline lock IV fluids.  8.  Chronic respiratory failure/COPD/probable acute bronchitis -On 4 L nasal cannula chronically at home currently stable. -Continue scheduled DuoNebs, Claritin, PPI, Mucinex, Flonase. -Patient with ongoing cough, repeat chest x-ray done with mild basilar opacities likely atelectasis.   -Patient with recent COVID, COPD, patient with a cough,  patient started on azithromycin empirically for probable acute bronchitis.   9.  OSA -Noted to be noncompliant with CPAP.  10.  E. coli UTI Urine cultures pending with > 100,000  colonies of E coli. -Sensitivities pending.  Fosfomycin x 1 dose pending urine culture results.    DVT prophylaxis: Xarelto Code Status: Full Family Communication: Updated patient and daughter at bedside. Disposition: Likely home with home health when cleared medically stable with improvement with leukocytosis and cough hopefully in the next 1 to 2 days.    Status is: Inpatient The patient will require care spanning > 2 midnights and should be moved to inpatient because: Severity of illness   Consultants:  None  Procedures:  CT head 05/13/2022 Chest x-ray 05/13/2022, 05/15/2022   Antimicrobials: Azithromycin 05/15/2022>>> oral azithromycin 05/16/2022 Fosfomycin x 1 dose 05/15/2022   Subjective: Laying in bed.  Does not feel too well.  Still with a nonproductive cough per daughter.  Denies any chest pain.  Denies any shortness of breath.  Denies any abdominal pain.    Objective: Vitals:   05/16/22 0212 05/16/22 0442 05/16/22 0854 05/16/22 1057  BP: (!) 131/54 139/72  116/81  Pulse: 74 82  (!) 109  Resp: 18 (!) 22  20  Temp: 98 F (36.7 C) 98 F (36.7 C)    TempSrc: Oral Oral    SpO2: 96% 95% 97% 95%    Intake/Output Summary (Last 24 hours) at 05/16/2022 1117 Last data filed at 05/16/2022 1047 Gross per 24 hour  Intake 1420.83 ml  Output 1400 ml  Net 20.83 ml    There were no vitals filed for this visit.  Examination:  General exam: NAD. Respiratory system: Decreased diffuse coarse breath sounds.  No wheezing.  Fair air movement.  Speaking in full sentences.  Cardiovascular system: RRR no murmurs rubs or gallops.  No JVD.  No lower extremity edema.   Gastrointestinal system: Abdomen is soft, nontender, nondistended, positive bowel sounds.  No rebound.  No guarding.  Central nervous system: Alert and oriented. No focal neurological deficits. Extremities: Symmetric 5 x 5 power. Skin: No rashes, lesions or ulcers Psychiatry: Judgement and insight appear normal. Mood &  affect appropriate.     Data Reviewed: I have personally reviewed following labs and imaging studies  CBC: Recent Labs  Lab 05/13/22 2111 05/14/22 0830 05/15/22 0352 05/16/22 0549  WBC 19.2* 20.6* 19.6* 16.1*  NEUTROABS 17.1* 17.4* 17.7* 14.2*  HGB 12.8 11.6* 10.4* 10.9*  HCT 38.9 35.6* 31.9* 34.3*  MCV 88.8 87.9 88.9 90.3  PLT PLATELET CLUMPS NOTED ON SMEAR, UNABLE TO ESTIMATE PLATELET CLUMPS NOTED ON SMEAR, UNABLE TO ESTIMATE PLATELET CLUMPS NOTED ON SMEAR, UNABLE TO ESTIMATE PLATELET CLUMPS NOTED ON SMEAR, UNABLE TO ESTIMATE     Basic Metabolic Panel: Recent Labs  Lab 05/13/22 2111 05/13/22 2344 05/14/22 0254 05/14/22 0300 05/14/22 0700 05/15/22 0352 05/16/22 0549  NA 122*  --   --  132* 135 131* 132*  K 6.0*  --   --  4.1 3.9 4.2 3.8  CL 87*  --   --  100 104 95* 98  CO2 23  --   --  '25 25 26 28  '$ GLUCOSE 861*  --   --  141* 113* 246* 214*  BUN 67*  --   --  61* 51* 34* 27*  CREATININE 1.32*  --   --  1.13* 0.94 0.91 0.86  CALCIUM 9.8  --   --  8.9 8.2* 8.6* 8.5*  MG  --  1.5* 1.6*  --  1.6* 2.0  --   PHOS  --  2.5 2.7  --   --  2.5  --      GFR: Estimated Creatinine Clearance: 49 mL/min (by C-G formula based on SCr of 0.86 mg/dL).  Liver Function Tests: Recent Labs  Lab 05/13/22 2111 05/15/22 0352  AST 21  --   ALT 24  --   ALKPHOS 150*  --   BILITOT 1.4*  --   PROT 6.8  --   ALBUMIN 3.7 3.0*     CBG: Recent Labs  Lab 05/15/22 1217 05/15/22 1434 05/15/22 1740 05/15/22 2133 05/16/22 0746  GLUCAP 283* 328* 211* 204* 230*      Recent Results (from the past 240 hour(s))  Urine Culture     Status: Abnormal (Preliminary result)   Collection Time: 05/13/22  9:33 PM   Specimen: In/Out Cath Urine  Result Value Ref Range Status   Specimen Description   Final    IN/OUT CATH URINE Performed at [facility], [place] [address] [place], [place] [address]    Special Requests   Final    NONE Performed at [facility], [place] [address] [place], [place] [address]    Culture (A)  Final    >=100,000 COLONIES/mL ESCHERICHIA COLI SUSCEPTIBILITIES TO FOLLOW Performed at [facility], [place] [address], [place],  [address]    Report Status PENDING  Incomplete          Radiology Studies: DG CHEST PORT 1 VIEW  Result Date: 05/15/2022 CLINICAL DATA:  Cough EXAM: PORTABLE CHEST 1 VIEW COMPARISON:  Chest x-ray dated May 13, 2022 FINDINGS: Cardiac and mediastinal contours are unchanged. Mild bibasilar opacities, likely atelectasis. No large pleural effusion no evidence of pneumothorax. IMPRESSION: Mild bibasilar opacities, likely atelectasis. Electronically Signed   By: Yetta Glassman M.D.   On: 05/15/2022 08:12        Scheduled Meds:  azithromycin  250 mg Oral Daily   fluticasone  2 spray Each Nare Daily   fosfomycin  3 g Oral Once   guaiFENesin  1,200 mg Oral BID   insulin aspart  0-9 Units Subcutaneous TID WC   insulin glargine-yfgn  20 Units Subcutaneous Daily   loratadine  10 mg Oral Daily   Netarsudil-Latanoprost  1 drop Both Eyes QHS   pantoprazole  40 mg Oral Daily   rivaroxaban  15 mg Oral BID   Followed by   Derrill Memo ON 05/23/2022] rivaroxaban  20 mg Oral Q supper   zinc sulfate  220 mg Oral Daily   Continuous Infusions:  sodium chloride       LOS: 2 days    Time spent: 40 minutes    Irine Seal, MD [facility]   To contact the attending provider between 7A-7P or the covering provider during after hours 7P-7A, please log into the web site [email or web address] and access using universal [place] password for that web site. If you do not have the password, please call the hospital operator.  05/16/2022, 11:17 AM

## 2022-05-16 NOTE — Evaluation (Signed)
SLP Cancellation Note  Patient Details Name: [name] MRN: [id number] DOB: August 05, 1930   Cancelled treatment:       Reason Eval/Treat Not Completed: Other (comment);Fatigue/lethargy limiting ability to participate (pt with open mouth breathing, lethargic, did not awaken adequately for po with verbal/tactile stimulation. RN reported via secure chat to SLP that pt was doing better with small single sips.     Given pt's mentation, advised daughter, [name] to recommendations to only have pt consume tsps of thin liquids when fully alert and upright - cease intake if pt coughing.   Wrote notes on board and alerted RN.   Pending evaluation, recommend medicine with applesauce - crushed - and SLP will follow up next date for evaluation.  [name] reports understanding and agrees.   Kathleen Lime, MS [facility] SLP Acute Rehab Services Office [phone]     Macario Golds 05/16/2022, 3:13 PM

## 2022-05-16 NOTE — Progress Notes (Signed)
PT Cancellation Note  Patient Details Name: [name] MRN: [id number] DOB: [birth year]   Cancelled Treatment:    Reason Eval/Treat Not Completed: Fatigue/lethargy limiting ability to participate. Per SLP pt extremely lethargic, unable to arouse. Family reported to SLP pt has been sleeping "all day"   [facility] 05/16/2022, 3:10 PM

## 2022-05-16 NOTE — TOC Transition Note (Signed)
Transition of Care [facility]) - CM/SW Discharge Note  Patient Details  Name: [name] MRN: [id number] Date of Birth: 02-25-1931  Transition of Care [facility]) CM/SW Contact:  Sherie Don, LCSW Phone Number: 05/16/2022, 10:29 AM  Clinical Narrative: Patient will discharge home with [facility] (PT, OT, SW, RN, aide) with [facility]. CSW notified Cindie regarding [place] orders. CSW updated patient's daughter regarding resuming HH with [facility]. TOC signing off.   Final next level of care: Home w Home Health Services Barriers to Discharge: Barriers Resolved  Patient Goals and CMS Choice CMS Medicare.gov Compare Post Acute Care list provided to:: Patient Represenative (must comment) Choice offered to / list presented to : Adult Children  Discharge Plan and Services Additional resources added to the After Visit Summary for   HH Arranged: RN, PT, OT, Social Work, Nurse's Aide HH Agency: [place] [facility] Agency Contacted: 05/16/22 Representative spoke with at [place]: [place] ([place]) Interventions SDOH Screenings   Food Insecurity: No Food Insecurity (05/03/2022)  Housing: Low Risk  (05/03/2022)  Transportation Needs: No Transportation Needs (05/03/2022)  Utilities: Not At Risk (05/03/2022)  Tobacco Use: Low Risk  (05/13/2022)   Readmission Risk Interventions    05/07/2022    3:17 PM  Readmission Risk Prevention Plan  Transportation Screening Complete  PCP or Specialist Appt within 3-5 Days Complete  HRI or [place] Complete  Social Work Consult for [place] Planning/Counseling Complete  Palliative Care Screening Not Applicable  Medication Review [employer]) Complete

## 2022-05-17 DIAGNOSIS — N179 Acute kidney failure, unspecified: Secondary | ICD-10-CM | POA: Diagnosis not present

## 2022-05-17 DIAGNOSIS — K625 Hemorrhage of anus and rectum: Secondary | ICD-10-CM

## 2022-05-17 DIAGNOSIS — I5032 Chronic diastolic (congestive) heart failure: Secondary | ICD-10-CM | POA: Diagnosis not present

## 2022-05-17 DIAGNOSIS — J9611 Chronic respiratory failure with hypoxia: Secondary | ICD-10-CM | POA: Diagnosis not present

## 2022-05-17 DIAGNOSIS — E1100 Type 2 diabetes mellitus with hyperosmolarity without nonketotic hyperglycemic-hyperosmolar coma (NKHHC): Secondary | ICD-10-CM | POA: Diagnosis not present

## 2022-05-17 LAB — MAGNESIUM: Magnesium: 1.9 mg/dL (ref 1.7–2.4)

## 2022-05-17 LAB — CBC WITH DIFFERENTIAL/PLATELET
Abs Immature Granulocytes: 0.09 10*3/uL — ABNORMAL HIGH (ref 0.00–0.07)
Basophils Absolute: 0 10*3/uL (ref 0.0–0.1)
Basophils Relative: 0 %
Eosinophils Absolute: 0.2 10*3/uL (ref 0.0–0.5)
Eosinophils Relative: 1 %
HCT: 38.5 % (ref 36.0–46.0)
Hemoglobin: 12 g/dL (ref 12.0–15.0)
Immature Granulocytes: 1 %
Lymphocytes Relative: 9 %
Lymphs Abs: 1.3 10*3/uL (ref 0.7–4.0)
MCH: 28.9 pg (ref 26.0–34.0)
MCHC: 31.2 g/dL (ref 30.0–36.0)
MCV: 92.8 fL (ref 80.0–100.0)
Monocytes Absolute: 0.8 10*3/uL (ref 0.1–1.0)
Monocytes Relative: 6 %
Neutro Abs: 12.2 10*3/uL — ABNORMAL HIGH (ref 1.7–7.7)
Neutrophils Relative %: 83 %
Platelets: UNDETERMINED 10*3/uL (ref 150–400)
RBC: 4.15 MIL/uL (ref 3.87–5.11)
RDW: 13.4 % (ref 11.5–15.5)
WBC: 14.7 10*3/uL — ABNORMAL HIGH (ref 4.0–10.5)
nRBC: 0 % (ref 0.0–0.2)

## 2022-05-17 LAB — URINE CULTURE: Culture: >=100000 — AB

## 2022-05-17 LAB — BASIC METABOLIC PANEL
Anion gap: 6 (ref 5–15)
BUN: 20 mg/dL (ref 8–23)
CO2: 29 mmol/L (ref 22–32)
Calcium: 8.6 mg/dL — ABNORMAL LOW (ref 8.9–10.3)
Chloride: 99 mmol/L (ref 98–111)
Creatinine, Ser: 0.87 mg/dL (ref 0.44–1.00)
GFR, Estimated: >60 mL/min (ref >60)
Glucose, Bld: 153 mg/dL — ABNORMAL HIGH (ref 70–99)
Potassium: 4 mmol/L (ref 3.5–5.1)
Sodium: 134 mmol/L — ABNORMAL LOW (ref 135–145)

## 2022-05-17 LAB — GLUCOSE, CAPILLARY
Glucose-Capillary: 191 mg/dL — ABNORMAL HIGH (ref 70–99)
Glucose-Capillary: 221 mg/dL — ABNORMAL HIGH (ref 70–99)
Glucose-Capillary: 292 mg/dL — ABNORMAL HIGH (ref 70–99)
Glucose-Capillary: 165 mg/dL — ABNORMAL HIGH (ref 70–99)

## 2022-05-17 MED ORDER — HYDROCORTISONE ACETATE 25 MG RE SUPP
25.0000 mg | Freq: Two times a day (BID) | RECTAL | Status: DC
  Administered 2022-05-17 – 2022-05-22 (×10): 25 mg via RECTAL
  Filled 2022-05-17 (×10): qty 1

## 2022-05-17 NOTE — Evaluation (Signed)
Physical Therapy Evaluation Patient Details Name: [name] MRN: [id number] DOB: [birth year] Today's Date: 05/17/2022  History of Present Illness  Patient is a pleasant 89 year old female with history of chronic respiratory failure secondary to COPD on chronic home O2 4 L nasal cannula, type 2 diabetes, CKD stage IIIb, glaucoma, hypertension, hyperlipidemia presented with a 1 week history of cough shortness of breath. Patient admitted for COVID-19 infection as well as an acute COPD exacerbation.  Clinical Impression  Pt admitted with above diagnosis.  Pt sleepy,  limited by fatigue but agreeable to OOB. Much weaker than at time of last hospitalization.  Pt and family are in agreement with plan for SNF for post acute rehab given pt generalized weakness, deconditioning, & multiple recent admissions.  SpO2=94-95% on 2L. Pt is on chronic O2 at night at home  Pt currently with functional limitations due to the deficits listed below (see PT Problem List). Pt will benefit from skilled PT to increase their independence and safety with mobility to allow discharge to the venue listed below.          Recommendations for follow up therapy are one component of a multi-disciplinary discharge planning process, led by the attending physician.  Recommendations may be updated based on patient status, additional functional criteria and insurance authorization.  Follow Up Recommendations Skilled nursing-short term rehab (<3 hours/day) Can patient physically be transported by private vehicle: Yes    Assistance Recommended at Discharge Intermittent Supervision/Assistance  Patient can return home with the following  A little help with walking and/or transfers;A little help with bathing/dressing/bathroom;Help with stairs or ramp for entrance;Assistance with cooking/housework;Assist for transportation    Equipment Recommendations None recommended by PT  Recommendations for Other Services       Functional  Status Assessment Patient has had a recent decline in their functional status and demonstrates the ability to make significant improvements in function in a reasonable and predictable amount of time.     Precautions / Restrictions Precautions Precaution Comments: monitor o2      Mobility  Bed Mobility   Bed Mobility: Supine to Sit     Supine to sit: Min assist, HOB elevated     General bed mobility comments: incr time, assist to elevate trunk, use of rail    Transfers Overall transfer level: Needs assistance Equipment used: Rolling walker (2 wheels) Transfers: Sit to/from Stand, Bed to chair/wheelchair/BSC Sit to Stand: Min assist, +2 safety/equipment   Step pivot transfers: Min assist       General transfer comment: assist to rise from EOB, shaky in standing; able to take pivotal steps to chair with assist to steady    Ambulation/Gait                  Stairs            Wheelchair Mobility    Modified Rankin (Stroke Patients Only)       Balance Overall balance assessment: Needs assistance Sitting-balance support: Feet supported, No upper extremity supported, Single extremity supported Sitting balance-Leahy Scale: Good       Standing balance-Leahy Scale: Poor Standing balance comment: reliant on UEs                             Pertinent Vitals/Pain      Home Living Family/patient expects to be discharged to:: Skilled nursing facility  Home Equipment: Cane - single point;Rollator (4 wheels) Additional Comments: daughter helps her as needed. dtr works full time. wears oxygen at night    Prior Function Prior Level of Function : Independent/Modified Independent               ADLs Comments: independent, still cooks.     Hand Dominance        Extremity/Trunk Assessment   Upper Extremity Assessment Upper Extremity Assessment: Defer to OT evaluation    Lower Extremity Assessment Lower Extremity  Assessment: Generalized weakness       Communication   Communication: HOH  Cognition Arousal/Alertness: Awake/alert Behavior During Therapy: WFL for tasks assessed/performed Overall Cognitive Status: Within Functional Limits for tasks assessed                                 General Comments: very sleepy, arouses wtih multi-modal stim        General Comments      Exercises     Assessment/Plan    PT Assessment Patient needs continued PT services  PT Problem List         PT Treatment Interventions Gait training;DME instruction;Therapeutic exercise;Balance training;Functional mobility training;Therapeutic activities;Patient/family education;Stair training    PT Goals (Current goals can be found in the Care Plan section)  Acute Rehab PT Goals Patient Stated Goal: home PT Goal Formulation: With patient Time For Goal Achievement: 05/31/22 Potential to Achieve Goals: Good    Frequency Min 2X/week     Co-evaluation               AM-PAC PT "6 Clicks" Mobility  Outcome Measure Help needed turning from your back to your side while in a flat bed without using bedrails?: A Little Help needed moving from lying on your back to sitting on the side of a flat bed without using bedrails?: A Little Help needed moving to and from a bed to a chair (including a wheelchair)?: A Little Help needed standing up from a chair using your arms (e.g., wheelchair or bedside chair)?: A Little Help needed to walk in hospital room?: A Lot Help needed climbing 3-5 steps with a railing? : A Lot 6 Click Score: 16    End of Session Equipment Utilized During Treatment: Gait belt Activity Tolerance: Patient limited by fatigue;Patient tolerated treatment well Patient left: in chair;with call bell/phone within reach;with chair alarm set;with family/visitor present   PT Visit Diagnosis: Difficulty in walking, not elsewhere classified (R26.2)    Time: 3299-2426 PT Time Calculation  (min) (ACUTE ONLY): 22 min   Charges:   PT Evaluation $PT Eval Low Complexity: [place], PT  Acute Rehab Dept [address] [facility]) [phone]  [facility] Weekend Pager (Saturday/Sunday only)  [phone]  05/17/2022   [facility] 05/17/2022, 3:18 PM

## 2022-05-17 NOTE — Evaluation (Signed)
Clinical/Bedside Swallow Evaluation Patient Details  Name: [name] MRN: [id number] Date of Birth: [birth year]  Today's Date: 05/17/2022 Time: SLP Start Time (ACUTE ONLY): 79 SLP Stop Time (ACUTE ONLY): 6578 SLP Time Calculation (min) (ACUTE ONLY): 20 min  Past Medical History:  Past Medical History:  Diagnosis Date   Cellulitis and abscess of left leg    CHF (congestive heart failure) (HCC)    COPD (chronic obstructive pulmonary disease) ([place])    Diabetes mellitus    Glaucoma    Hyperlipidemia    Hypertension    Past Surgical History:  Past Surgical History:  Procedure Laterality Date   BACK SURGERY     CARDIAC CATHETERIZATION N/A 12/09/2015   Procedure: Right Heart Cath;  Surgeon: Adrian Prows, MD;  Location: [place] [facility];  Service: Cardiovascular;  Laterality: N/A;   CHOLECYSTECTOMY  2000   HERNIA REPAIR  2007   rupture disk  1970"s   SHOULDER ARTHROSCOPY WITH ROTATOR CUFF REPAIR  1999   rt shoulder   HPI:  Patient is a 89 year old female history of hypertension, hyperlipidemia, chronic respiratory failure/COPD on 4 L nasal cannula, diastolic CHF, recently diagnosed bilateral lower extremity DVT, recent history of COVID, went to follow-up with PCP on day of admission, noted to have been unable to get her Eliquis for the past 6 days since discharge as pharmacy did not have it available, patient started on Xarelto per PCP.  Patient seen at PCPs office noted to have hyperkalemia given [facility], also noted to be hyperglycemic, noted to have some confusion and subsequently presented to the ED.  Patient seen in the ED placed on Endo tool/insulin drip.    Assessment / Plan / Recommendation  Clinical Impression  Patient presents with evidence of a pharyngeal phase dysphagia. Nursing and family report significant coughing post swallow with thin liquids. Upon entering room, observed patient to be having a severe coughing episode without any po intake involved. Once she settled,  she was able to consume nectar thick liquids via small single sips without overt s/s of aspiration. She then consumed thin liquids, pureed solids, and mechanical soft solids, all via small single bites/sips as instructed by SLP without overt indication of aspiration. Note that patient has been very lethargic, even as of this morning. Alert for today's evaluation but suspect that combination of lethargy, decreased coordination of breath and swallow given COPD exacerbation and need for O2, and vocal cord irritation/possible decreased glottal closure secondary to significant coughing, all contributing to decreased airway closure during po intake. Given fluctuating degree of alertness and continued SOB, recommend continuing current diet, dysphagia 3 with nectar thick liquid, with SLP f/u for potential diet upgrade next date if she continues to be alert and able to consistently carry out use of aspiration precautions. If s/s of aspiration persist, MBS would be beneficial to evaluate swallowoing physiology. SLP Visit Diagnosis: Dysphagia, pharyngeal phase (R13.13)       Diet Recommendation Dysphagia 3 (Mech soft);Nectar-thick liquid   Liquid Administration via: Cup;No straw Medication Administration: Whole meds with puree Supervision: Patient able to self feed;Full supervision/cueing for compensatory strategies Compensations: Slow rate;Small sips/bites Postural Changes: Seated upright at 90 degrees    Other  Recommendations Oral Care Recommendations: Oral care BID Other Recommendations: Order thickener from pharmacy;Prohibited food (jello, ice cream, thin soups);Remove water pitcher    Recommendations for follow up therapy are one component of a multi-disciplinary discharge planning process, led by the attending physician.  Recommendations may be updated based on patient  status, additional functional criteria and insurance authorization.           Frequency and Duration min 2x/week  1 week        Prognosis Prognosis for Safe Diet Advancement: Good      Swallow Study   General HPI: Patient is a 89 year old female history of hypertension, hyperlipidemia, chronic respiratory failure/COPD on 4 L nasal cannula, diastolic CHF, recently diagnosed bilateral lower extremity DVT, recent history of COVID, went to follow-up with PCP on day of admission, noted to have been unable to get her Eliquis for the past 6 days since discharge as pharmacy did not have it available, patient started on Xarelto per PCP.  Patient seen at PCPs office noted to have hyperkalemia given [facility], also noted to be hyperglycemic, noted to have some confusion and subsequently presented to the ED.  Patient seen in the ED placed on Endo tool/insulin drip. Type of Study: Bedside Swallow Evaluation Previous Swallow Assessment: none Diet Prior to this Study: Dysphagia 3 (soft);Thin liquids Temperature Spikes Noted: No Respiratory Status: Nasal cannula History of Recent Intubation: No Behavior/Cognition: Alert;Cooperative;Pleasant mood (HOH) Oral Cavity Assessment: Within Functional Limits Oral Care Completed by SLP: No Oral Cavity - Dentition: Adequate natural dentition Vision: Functional for self-feeding Self-Feeding Abilities: Able to feed self Patient Positioning: Upright in bed Baseline Vocal Quality: Hoarse (moderate-severe) Volitional Cough: Strong Volitional Swallow: Able to elicit    Oral/Motor/Sensory Function Overall Oral Motor/Sensory Function: Within functional limits   Ice Chips Ice chips: Not tested   Thin Liquid Thin Liquid: Within functional limits Presentation: Cup (via small single sips)    Nectar Thick Nectar Thick Liquid: Within functional limits Presentation: Cup   Honey Thick Honey Thick Liquid: Not tested   Puree Puree: Within functional limits Presentation: Self Fed;Spoon   Solid     Solid: Within functional limits Presentation: [place] MA, CCC-SLP  Salli Bodin  Meryl 05/17/2022,2:01 PM

## 2022-05-17 NOTE — Progress Notes (Signed)
PROGRESS NOTE    [name]  ZOX:[id number] DOB: 07-01-1930 DOA: 05/13/2022 PCP: [name], DO    Chief Complaint  Patient presents with   Abnormal Lab    Brief Narrative: Patient is a 89 year old female history of hypertension, hyperlipidemia, chronic respiratory failure/COPD on 4 L nasal cannula, diastolic CHF, recently diagnosed bilateral lower extremity DVT, recent history of COVID, went to follow-up with PCP on day of admission, noted to have been unable to get her Eliquis for the past 6 days since discharge as pharmacy did not have it available, patient started on Xarelto per PCP.  Patient seen at PCPs office noted to have hyperkalemia given [facility], also noted to be hyperglycemic, noted to have some confusion and subsequently presented to the ED.  Patient seen in the ED placed on Endo tool/insulin drip.  Hyperkalemia corrected.  Hospitalist asked to admit the patient.   Assessment & Plan:   Principal Problem:   Hyperosmolar hyperglycemic state (HHS) ([place]) Active Problems:   AKI (acute kidney injury) ([place])   Chronic diastolic CHF (congestive heart failure) (HCC)   Diabetes mellitus ([place])   Essential hypertension   Chronic obstructive pulmonary disease, unspecified (HCC)   Obstructive sleep apnea syndrome   Chronic hypoxic respiratory failure (HCC)   History of DVT (deep vein thrombosis)   Elevated lactic acid level   Hyperkalemia   Acute bronchitis   UTI (urinary tract infection)   Pressure injury of skin   Rectal bleeding  #1 hyperosmolar hyperglycemic state/diabetes mellitus type 2 -Likely secondary to recent steroids in the setting of underlying diabetes mellitus. -Urinalysis done on admission with glycosuria, small hemoglobin, small leukocytes. -Basic metabolic profile done with a glucose of 861, sodium of 122, potassium of 6.0, chloride of 87, creatinine of 1.32, anion gap of 12, bicarb of 23.  Magnesium noted at 1.5.  Serum osmolality of 329, lactic acid  level elevated at 5.2 and trended down. -CBC on admission with a white count of 19.2 otherwise within normal limits. -Patient placed on Endo tool/insulin drip, blood glucose levels improved, normal anion gap, patient subsequently transitioned to subcutaneous insulin and SSI. -Hemoglobin A1c 10.3 (05/03/2022). -CBG at 191 this morning. -Continue Semglee to 20 units daily, SSI, carb modified diet.   -Diabetes coordinator following.   2.  Hyperkalemia -Likely secondary to hyperosmolar state.  -Potassium of 4.0.   -Repeat labs in the AM.   3.  Acute kidney injury -Likely secondary to prerenal azotemia in the setting of hyperosmolar hyperglycemic state. -Renal function improved with hydration.  4.  Chronic diastolic CHF -Currently euvolemic on examination. -IV fluids saline lock.   5.  Recently diagnosed bilateral DVT -Patient noted not to have been able to get Eliquis when recently discharged as the pharmacy did not have it.  Patient noted to have been out of anticoagulation for the past 6 to 7 days as was unable to fill it as pharmacy did not have it on discharge during last hospitalization. -Patient started on Xarelto per PCP. -Continue Xarelto.   6.  Elevated lactic acid level -Likely secondary to dehydration. -Improved with hydration.  7.  Hypertension -BP is noted to be soft on admission. -On gentle hydration. -Continue to hold antihypertensive medications. -Saline lock IV fluids.  8.  Chronic respiratory failure/COPD/probable acute bronchitis -On 4 L nasal cannula chronically at home currently stable. -Continue scheduled DuoNebs, Claritin, PPI, Mucinex, Flonase. -Patient with ongoing cough, repeat chest x-ray done with mild basilar opacities likely atelectasis.   -Patient with recent  COVID, COPD, patient with a cough, patient started on azithromycin empirically for probable acute bronchitis.   9.  OSA -Noted to be noncompliant with CPAP.  10.  E. coli UTI Urine cultures  pending with > 100,000 colonies of E coli. -Cultures resistant to ampicillin and cefazolin, sensitive to cefepime, Rocephin, Cipro, gentamicin, imipenem, nitrofurantoin, Bactrim, Zosyn.   -Status post fosfomycin x 1 dose.  11.  Rectal bleeding -Per RN patient with large bowel movement and subsequently had some rectal bleeding. -??  Hemorrhoidal -Follow H&H. -Anusol suppository twice daily.  12, pressure injury, medial coccyx stage II, POA Pressure Injury 05/14/22 Coccyx Medial Stage 2 -  Partial thickness loss of dermis presenting as a shallow open injury with a red, pink wound bed without slough. (Active)  05/14/22 2000  Location: Coccyx  Location Orientation: Medial  Staging: Stage 2 -  Partial thickness loss of dermis presenting as a shallow open injury with a red, pink wound bed without slough.  Wound Description (Comments):   Present on Admission: Yes        DVT prophylaxis: Xarelto Code Status: Full Family Communication: Updated patient.  No family at bedside.  Disposition: Likely home with home health when cleared medically stable with improvement with leukocytosis and cough hopefully in the next 1 to 2 days.    Status is: Inpatient The patient will require care spanning > 2 midnights and should be moved to inpatient because: Severity of illness   Consultants:  None  Procedures:  CT head 05/13/2022 Chest x-ray 05/13/2022, 05/15/2022   Antimicrobials: Azithromycin 05/15/2022>>> oral azithromycin 05/16/2022>>>> Fosfomycin x 1 dose 05/15/2022   Subjective: Sleeping but arousable.  Feels cough is slowly improving.  No chest pain.  No shortness of breath.  No abdominal pain.    Objective: Vitals:   05/16/22 2206 05/17/22 0620 05/17/22 0858 05/17/22 1421  BP: 136/65 117/63 130/61 (!) 146/61  Pulse: 75 65 73 92  Resp: '16 18 20 20  '$ Temp: 98.5 F (36.9 C) 97.8 F (36.6 C) 98 F (36.7 C)   TempSrc: Oral  Oral   SpO2: 100% 100% 100% 95%    Intake/Output Summary  (Last 24 hours) at 05/17/2022 1522 Last data filed at 05/17/2022 0900 Gross per 24 hour  Intake 120 ml  Output --  Net 120 ml   There were no vitals filed for this visit.  Examination:  General exam: NAD. Respiratory system: Decreased diffuse coarse breath sounds.  No wheezing.  Fair air movement.  Speaking in full sentences.  Dry mucous membranes.  Cardiovascular system: Regular rate and rhythm no murmurs rubs or gallops.  No JVD.  No lower extremity edema.  Gastrointestinal system: Abdomen is soft, nontender, nondistended, positive bowel sounds.  No rebound.  No guarding.  Central nervous system: Alert and oriented. No focal neurological deficits. Extremities: Symmetric 5 x 5 power. Skin: No rashes, lesions or ulcers Psychiatry: Judgement and insight appear fair. Mood & affect appropriate.     Data Reviewed: I have personally reviewed following labs and imaging studies  CBC: Recent Labs  Lab 05/13/22 2111 05/14/22 0830 05/15/22 0352 05/16/22 0549 05/17/22 0718  WBC 19.2* 20.6* 19.6* 16.1* 14.7*  NEUTROABS 17.1* 17.4* 17.7* 14.2* 12.2*  HGB 12.8 11.6* 10.4* 10.9* 12.0  HCT 38.9 35.6* 31.9* 34.3* 38.5  MCV 88.8 87.9 88.9 90.3 92.8  PLT PLATELET CLUMPS NOTED ON SMEAR, UNABLE TO ESTIMATE PLATELET CLUMPS NOTED ON SMEAR, UNABLE TO ESTIMATE PLATELET CLUMPS NOTED ON SMEAR, UNABLE TO ESTIMATE PLATELET CLUMPS NOTED  ON SMEAR, UNABLE TO ESTIMATE PLATELET CLUMPS NOTED ON SMEAR, UNABLE TO ESTIMATE    Basic Metabolic Panel: Recent Labs  Lab 05/13/22 2344 05/14/22 0254 05/14/22 0300 05/14/22 0700 05/15/22 0352 05/16/22 0549 05/17/22 0718  NA  --   --  132* 135 131* 132* 134*  K  --   --  4.1 3.9 4.2 3.8 4.0  CL  --   --  100 104 95* 98 99  CO2  --   --  '25 25 26 28 29  '$ GLUCOSE  --   --  141* 113* 246* 214* 153*  BUN  --   --  61* 51* 34* 27* 20  CREATININE  --   --  1.13* 0.94 0.91 0.86 0.87  CALCIUM  --   --  8.9 8.2* 8.6* 8.5* 8.6*  MG 1.5* 1.6*  --  1.6* 2.0  --  1.9   PHOS 2.5 2.7  --   --  2.5  --   --     GFR: CrCl cannot be calculated (Unknown ideal weight.).  Liver Function Tests: Recent Labs  Lab 05/13/22 2111 05/15/22 0352  AST 21  --   ALT 24  --   ALKPHOS 150*  --   BILITOT 1.4*  --   PROT 6.8  --   ALBUMIN 3.7 3.0*    CBG: Recent Labs  Lab 05/16/22 1231 05/16/22 1729 05/16/22 2203 05/17/22 0746 05/17/22 1306  GLUCAP 253* 209* 186* 191* 221*     Recent Results (from the past 240 hour(s))  Urine Culture     Status: Abnormal   Collection Time: 05/13/22  9:33 PM   Specimen: In/Out Cath Urine  Result Value Ref Range Status   Specimen Description   Final    IN/OUT CATH URINE Performed at [facility], [place] [address] [place], [place] [address]    Special Requests   Final    NONE Performed at [facility], [place] [address], [place], [place] [address]    Culture >=100,000 COLONIES/mL ESCHERICHIA COLI (A)  Final   Report Status 05/17/2022 FINAL  Final   Organism ID, Bacteria ESCHERICHIA COLI (A)  Final      Susceptibility   Escherichia coli - MIC*    AMPICILLIN >=32 RESISTANT Resistant     CEFAZOLIN >=64 RESISTANT Resistant     CEFEPIME <=0.12 SENSITIVE Sensitive     CEFTRIAXONE <=0.25 SENSITIVE Sensitive     CIPROFLOXACIN <=0.25 SENSITIVE Sensitive     GENTAMICIN <=1 SENSITIVE Sensitive     IMIPENEM <=0.25 SENSITIVE Sensitive     NITROFURANTOIN <=16 SENSITIVE Sensitive     TRIMETH/SULFA <=20 SENSITIVE Sensitive     AMPICILLIN/SULBACTAM >=32 RESISTANT Resistant     PIP/TAZO 8 SENSITIVE Sensitive     * >=100,000 COLONIES/mL ESCHERICHIA COLI          Radiology Studies: No results found.      Scheduled Meds:  azithromycin  250 mg Oral Daily   fluticasone  2 spray Each Nare Daily   guaiFENesin  1,200 mg Oral BID   hydrocortisone  25 mg Rectal BID   insulin aspart  0-9 Units Subcutaneous TID WC   insulin glargine-yfgn  20 Units Subcutaneous Daily   loratadine  10 mg  Oral Daily   Netarsudil-Latanoprost  1 drop Both Eyes QHS   pantoprazole  40 mg Oral Daily   rivaroxaban  15 mg Oral BID   Followed by   Derrill Memo ON 05/23/2022] rivaroxaban  20  mg Oral Q supper   zinc sulfate  220 mg Oral Daily   Continuous Infusions:  sodium chloride       LOS: 3 days    Time spent: 35 minutes    Irine Seal, MD [facility]   To contact the attending provider between 7A-7P or the covering provider during after hours 7P-7A, please log into the web site [email or web address] and access using universal [place] password for that web site. If you do not have the password, please call the hospital operator.  05/17/2022, 3:22 PM

## 2022-05-18 ENCOUNTER — Inpatient Hospital Stay (HOSPITAL_COMMUNITY): Payer: Medicare Other

## 2022-05-18 DIAGNOSIS — J449 Chronic obstructive pulmonary disease, unspecified: Secondary | ICD-10-CM | POA: Diagnosis not present

## 2022-05-18 DIAGNOSIS — J9611 Chronic respiratory failure with hypoxia: Secondary | ICD-10-CM | POA: Diagnosis not present

## 2022-05-18 DIAGNOSIS — I5032 Chronic diastolic (congestive) heart failure: Secondary | ICD-10-CM | POA: Diagnosis not present

## 2022-05-18 DIAGNOSIS — E1100 Type 2 diabetes mellitus with hyperosmolarity without nonketotic hyperglycemic-hyperosmolar coma (NKHHC): Secondary | ICD-10-CM | POA: Diagnosis not present

## 2022-05-18 DIAGNOSIS — U071 COVID-19: Secondary | ICD-10-CM | POA: Diagnosis not present

## 2022-05-18 DIAGNOSIS — N179 Acute kidney failure, unspecified: Secondary | ICD-10-CM | POA: Diagnosis not present

## 2022-05-18 LAB — CBC
HCT: 31.8 % — ABNORMAL LOW (ref 36.0–46.0)
Hemoglobin: 10 g/dL — ABNORMAL LOW (ref 12.0–15.0)
MCH: 29.1 pg (ref 26.0–34.0)
MCHC: 31.4 g/dL (ref 30.0–36.0)
MCV: 92.4 fL (ref 80.0–100.0)
Platelets: UNDETERMINED 10*3/uL (ref 150–400)
RBC: 3.44 MIL/uL — ABNORMAL LOW (ref 3.87–5.11)
RDW: 13.4 % (ref 11.5–15.5)
WBC: 11 10*3/uL — ABNORMAL HIGH (ref 4.0–10.5)
nRBC: 0 % (ref 0.0–0.2)

## 2022-05-18 LAB — BASIC METABOLIC PANEL
Anion gap: 6 (ref 5–15)
BUN: 37 mg/dL — ABNORMAL HIGH (ref 8–23)
CO2: 27 mmol/L (ref 22–32)
Calcium: 8.4 mg/dL — ABNORMAL LOW (ref 8.9–10.3)
Chloride: 100 mmol/L (ref 98–111)
Creatinine, Ser: 1.28 mg/dL — ABNORMAL HIGH (ref 0.44–1.00)
GFR, Estimated: 40 mL/min — ABNORMAL LOW (ref >60)
Glucose, Bld: 185 mg/dL — ABNORMAL HIGH (ref 70–99)
Potassium: 4.8 mmol/L (ref 3.5–5.1)
Sodium: 133 mmol/L — ABNORMAL LOW (ref 135–145)

## 2022-05-18 LAB — GLUCOSE, CAPILLARY
Glucose-Capillary: 170 mg/dL — ABNORMAL HIGH (ref 70–99)
Glucose-Capillary: 260 mg/dL — ABNORMAL HIGH (ref 70–99)
Glucose-Capillary: 229 mg/dL — ABNORMAL HIGH (ref 70–99)
Glucose-Capillary: 251 mg/dL — ABNORMAL HIGH (ref 70–99)

## 2022-05-18 LAB — MAGNESIUM: Magnesium: 1.9 mg/dL (ref 1.7–2.4)

## 2022-05-18 MED ORDER — MENTHOL 3 MG MT LOZG
1.0000 | LOZENGE | OROMUCOSAL | Status: DC | PRN
  Administered 2022-05-18: 3 mg via ORAL
  Filled 2022-05-18: qty 9

## 2022-05-18 MED ORDER — VALACYCLOVIR 50 MG/ML ORAL SUSPENSION: 1000.0000 mg | Freq: Three times a day (TID) | ORAL | Status: DC

## 2022-05-18 MED ORDER — PHENOL 1.4 % MT LIQD
1.0000 | OROMUCOSAL | Status: DC | PRN
  Administered 2022-05-18: 1 via OROMUCOSAL
  Filled 2022-05-18: qty 177

## 2022-05-18 MED ORDER — VALACYCLOVIR HCL 500 MG PO TABS
1000.0000 mg | ORAL_TABLET | Freq: Three times a day (TID) | ORAL | Status: DC
  Administered 2022-05-18 – 2022-05-22 (×12): 1000 mg
  Filled 2022-05-18 (×11): qty 2

## 2022-05-18 MED ORDER — MENTHOL 3 MG MT LOZG: 1.0000 | LOZENGE | Freq: Three times a day (TID) | OROMUCOSAL | Status: DC

## 2022-05-18 MED ORDER — LIDOCAINE VISCOUS HCL 2 % MT SOLN: 7.5000 mL | Freq: Once | OROMUCOSAL | Status: DC

## 2022-05-18 MED ORDER — LIDOCAINE VISCOUS HCL 2 % MT SOLN: 10.0000 mL | Freq: Once | OROMUCOSAL | Status: DC

## 2022-05-18 MED ORDER — METOPROLOL TARTRATE 5 MG/5ML IV SOLN
5.0000 mg | Freq: Once | INTRAVENOUS | Status: AC
  Administered 2022-05-18: 5 mg via INTRAVENOUS
  Filled 2022-05-18: qty 5

## 2022-05-18 MED ORDER — SODIUM CHLORIDE 0.9 % IV SOLN: INTRAVENOUS | Status: DC

## 2022-05-18 MED ORDER — MENTHOL 3 MG MT LOZG
1.0000 | LOZENGE | Freq: Once | OROMUCOSAL | Status: AC
  Administered 2022-05-18: 3 mg via ORAL
  Filled 2022-05-18: qty 9

## 2022-05-18 NOTE — Progress Notes (Signed)
Modified Barium Swallow Progress Note  Patient Details  Name: [name] MRN: [id number] Date of Birth: 12/28/1930  Today's Date: 05/18/2022  Modified Barium Swallow completed.  Full report located under Chart Review in the Imaging Section.  Brief recommendations include the following:  Clinical Impression  Patient presents with a mild pharyngeal phase dysphagia but with oral phase WFL. SLP used the following barium consistencies: thin, nectar thick, puree, 84m barium tablet. During pharyngeal phase, patient exhibited decreased hyolaryngeal movement leading to trace to mild vallecular and pyriform sinus residuals with thin liquids and mild vallecular sinus residuals, trace pyriform residuals with nectar thick liquid. No significant amount of PO residuals observed with puree solids taken with 140mbarium tablet. Patient exhibited trace penetration of thin liquid barium into laryngeal vestibule, however airway appeared well protected. Penetration was sensed by patient, who exhibited a throat clear and cough. Slightly increased amount of vallecular residuals observed with nectar thick liquid, but no penetration. With thin and nectar thick liquids, patient able to clear majority of barium residuals with 1-2 dry swallows and during penetration events, she was able to clear laryngeal vestibule with cued cough and reswallow. No aspiration observed with any of the tested barium consistencies.  As patient continues with significant amount of coughing, with some of this coughing likely due to sensed penetration, SLP recommending to continue with nectar thick liquids at this time but SLP will plan for trials of upgraded liquids (thin consistency with anticipation of good progress overall.   Swallow Evaluation Recommendations       SLP Diet Recommendations: Dysphagia 3 (Mech soft) solids;Nectar thick liquid   Liquid Administration via: Cup;Straw   Medication Administration: Whole meds with puree    Supervision: Patient able to self feed   Compensations: Slow rate;Small sips/bites   Postural Changes: Seated upright at 90 degrees   Oral Care Recommendations: Oral care BID        JoSonia BallerMA, CCC-SLP Speech Therapy

## 2022-05-18 NOTE — Progress Notes (Addendum)
   05/18/22 1320  Assess: MEWS Score  Temp 98 F (36.7 C)  BP 131/80  MAP (mmHg) 71  Pulse Rate (!) 119  Resp 20  SpO2 98 %  O2 Device Nasal Cannula  O2 Flow Rate (L/min) 3 L/min  Assess: MEWS Score  MEWS Temp 0  MEWS Systolic 0  MEWS Pulse 2  MEWS RR 0  MEWS LOC 0  MEWS Score 2  MEWS Score Color Yellow  Assess: if the MEWS score is Yellow or Red  Were vital signs taken at a resting state? Yes  Focused Assessment Change from prior assessment (see assessment flowsheet)  Does the patient meet 2 or more of the SIRS criteria? No  MEWS guidelines implemented *See Row Information* Yes  Take Vital Signs  Increase Vital Sign Frequency  Yellow: Q 2hr X 2 then Q 4hr X 2, if remains yellow, continue Q 4hrs  Escalate  MEWS: Escalate Yellow: discuss with charge nurse/RN and consider discussing with provider and RRT  Notify: Charge Nurse/RN  Name of Charge Nurse/RN Notified Russ Halo, RN  Date Charge Nurse/RN Notified 05/18/22  Time Charge Nurse/RN Notified 1344  Provider Notification  Provider Name/Title Dr. Grandville Silos  Date Provider Notified 05/18/22  Time Provider Notified 1341  Method of Notification Page  Notification Reason Change in status (yellow mews)  Provider response No new orders  Assess: SIRS CRITERIA  SIRS Temperature  0  SIRS Pulse 1  SIRS Respirations  0  SIRS WBC 0  SIRS Score Sum  1   1342: Provider response, see new orders

## 2022-05-18 NOTE — Progress Notes (Signed)
Speech Language Pathology Treatment:    Patient Details Name: [name] MRN: [id number] DOB: April 01, 1931 Today's Date: 05/18/2022  Per nursing note, patient coughing all night and when SLP arrived to her room this morning, she continues to cough, voice is very hoarse and raspy. Patient in agreement to proceed with MBS to r/o aspiration. SLP to schedule with radiology.  Sonia Baller, MA, CCC-SLP Speech Therapy

## 2022-05-18 NOTE — Inpatient Diabetes Management (Addendum)
Inpatient Diabetes Program Recommendations  AACE/ADA: New Consensus Statement on Inpatient Glycemic Control (2015)  Target Ranges:  Prepandial:   less than 140 mg/dL      Peak postprandial:   less than 180 mg/dL (1-2 hours)      Critically ill patients:  140 - 180 mg/dL    Latest Reference Range & Units 05/17/22 07:46 05/17/22 13:06 05/17/22 17:20 05/17/22 21:05  Glucose-Capillary 70 - 99 mg/dL 191 (H)  2 units Novolog '@0858'$  221 (H)  3 units Novolog   20 units Semglee '@1100'$   292 (H)  3 units Novolog  165 (H)  (H): Data is abnormally high  Latest Reference Range & Units 05/18/22 07:28  Glucose-Capillary 70 - 99 mg/dL 170 (H)  2 units Novolog   (H): Data is abnormally high    Home DM Meds: Metformin 500 mg daily     Victoza 0.6 mg daily     Tresiba 15 units daily--Not yet started at home per Daughter  Current Orders: Semglee 20 units daily      Novolog Sensitive Correction Scale/ SSI (0-9 units) TID AC   MD- Please consider:  1. Increasing the Novolog SSI to the 0-15 unit Moderate scale  2. Increasing the Semglee slightly to 22 units daily   Per Diabetes Coordinator note from 05/15/2022: "Scottie (pt's daughter) states her mother's PCP just ordered Tresiba 15 units QD on 1/17 and she "went through all the training" although has not given her insulin yet since pt was admitted. Discussed hypoglycemia s/s and treatment and daughter had no other questions/concerns."    --Will follow patient during hospitalization--  Wyn Quaker RN, MSN, [place] Diabetes Coordinator Inpatient Glycemic Control Team Team Pager: [phone] (8a-5p)

## 2022-05-18 NOTE — Progress Notes (Signed)
PROGRESS NOTE    [name]  ZOX:[id number] DOB: 19-Apr-1931 DOA: 05/13/2022 PCP: [name], DO    Chief Complaint  Patient presents with   Abnormal Lab    Brief Narrative: Patient is a 89 year old female history of hypertension, hyperlipidemia, chronic respiratory failure/COPD on 4 L nasal cannula, diastolic CHF, recently diagnosed bilateral lower extremity DVT, recent history of COVID, went to follow-up with PCP on day of admission, noted to have been unable to get her Eliquis for the past 6 days since discharge as pharmacy did not have it available, patient started on Xarelto per PCP.  Patient seen at PCPs office noted to have hyperkalemia given [facility], also noted to be hyperglycemic, noted to have some confusion and subsequently presented to the ED.  Patient seen in the ED placed on Endo tool/insulin drip.  Hyperkalemia corrected.  Hospitalist asked to admit the patient.   Assessment & Plan:   Principal Problem:   Hyperosmolar hyperglycemic state (HHS) ([place]) Active Problems:   AKI (acute kidney injury) ([place])   Chronic diastolic CHF (congestive heart failure) ([facility])   Diabetes mellitus ([place])   Essential hypertension   Chronic obstructive pulmonary disease, unspecified ([facility])   Obstructive sleep apnea syndrome   Chronic hypoxic respiratory failure ([facility])   History of DVT (deep vein thrombosis)   Elevated lactic acid level   Hyperkalemia   Acute bronchitis   UTI (urinary tract infection)   Pressure injury of skin   Rectal bleeding  #1 hyperosmolar hyperglycemic state/diabetes mellitus type 2 -Likely secondary to recent steroids in the setting of underlying diabetes mellitus. -Urinalysis done on admission with glycosuria, small hemoglobin, small leukocytes. -Basic metabolic profile done with a glucose of 861, sodium of 122, potassium of 6.0, chloride of 87, creatinine of 1.32, anion gap of 12, bicarb of 23.  Magnesium noted at 1.5.  Serum osmolality of 329, lactic acid  level elevated at 5.2 and trended down. -CBC on admission with a white count of 19.2 otherwise within normal limits. -Patient placed on Endo tool/insulin drip, blood glucose levels improved, normal anion gap, patient subsequently transitioned to subcutaneous insulin and SSI. -Hemoglobin A1c 10.3 (05/03/2022). -CBG at 170 this morning. -Continue Semglee to 20 units daily, SSI, carb modified diet.   -Diabetes coordinator following.   2.  Hyperkalemia -Likely secondary to hyperosmolar state.  -Potassium of 4.8.   -Repeat labs in the AM.   3.  Acute kidney injury -Likely secondary to prerenal azotemia in the setting of hyperosmolar hyperglycemic state. -Renal function improved improved, initially slight bump in creatinine.  -Gentle hydration for the next 24 hours.   4.  Chronic diastolic CHF -Currently euvolemic on examination. -IV fluids saline lock.   5.  Recently diagnosed bilateral DVT -Patient noted not to have been able to get Eliquis when recently discharged as the pharmacy did not have it.  Patient noted to have been out of anticoagulation for the past 6 to 7 days as was unable to fill it as pharmacy did not have it on discharge during last hospitalization. -Patient started on Xarelto per PCP. -Continue Xarelto.   6.  Elevated lactic acid level -Likely secondary to dehydration. -Improved with hydration.  7.  Hypertension -BP is noted to be soft on admission. -On gentle hydration. -Continue to hold antihypertensive medications. -IV fluids-saline lock.    8.  Chronic respiratory failure/COPD/probable acute bronchitis -On 4 L nasal cannula chronically at home currently stable. -Continue scheduled DuoNebs, Claritin, PPI, Mucinex, Flonase. -Patient with ongoing cough, repeat  chest x-ray done with mild basilar opacities likely atelectasis.   -Patient with recent COVID, COPD, patient with a cough, patient started on azithromycin empirically for probable acute bronchitis.   -Cepacol lozenges as needed. -Cough concerning for aspiration, SLP evaluated and patient for MBS today.  9.  OSA -Noted to be noncompliant with CPAP.  10.  E. coli UTI Urine cultures with > 100,000 colonies of E coli. -Cultures resistant to ampicillin and cefazolin, sensitive to cefepime, Rocephin, Cipro, gentamicin, imipenem, nitrofurantoin, Bactrim, Zosyn.   -Status post fosfomycin x 1 dose.  11.  Rectal bleeding -Per RN patient with large bowel movement and subsequently had some rectal bleeding on 05/17/2022. -Hemoglobin stable at 10.0. -??  Hemorrhoidal -Follow H&H. -Anusol suppository twice daily.  12, pressure injury, medial coccyx stage II, POA Pressure Injury 05/14/22 Coccyx Medial Stage 2 -  Partial thickness loss of dermis presenting as a shallow open injury with a red, pink wound bed without slough. (Active)  05/14/22 2000  Location: Coccyx  Location Orientation: Medial  Staging: Stage 2 -  Partial thickness loss of dermis presenting as a shallow open injury with a red, pink wound bed without slough.  Wound Description (Comments):   Present on Admission: Yes        DVT prophylaxis: Xarelto Code Status: Full Family Communication: Updated patient.  No family at bedside.  Disposition: Likely home with home health versus SNF, when cleared medically stable with improvement with leukocytosis and cough hopefully in the next 1 to 2 days.    Status is: Inpatient The patient will require care spanning > 2 midnights and should be moved to inpatient because: Severity of illness   Consultants:  None  Procedures:  CT head 05/13/2022 Chest x-ray 05/13/2022, 05/15/2022   Antimicrobials: Azithromycin 05/15/2022>>> oral azithromycin 05/16/2022>>>> Fosfomycin x 1 dose 05/15/2022   Subjective: Sitting up at the side of the bed about to eat lunch.  Patient denies any chest pain.  No significant shortness of breath.  Patient noted to have a cough which she states occurs when she  tries to take anything in.  Denies any chest pain.  Seen by speech therapy and patient for MBS later on today.   Objective: Vitals:   05/17/22 2103 05/18/22 0100 05/18/22 0524 05/18/22 0820  BP: (!) 125/55  (!) 133/49   Pulse: 84  72   Resp: '19  19 19  '$ Temp: 99 F (37.2 C)  98.6 F (37 C)   TempSrc: Oral  Oral   SpO2: 98%  93% 97%  Weight:  87.4 kg    Height:  '5\' 4"'$  (1.626 m)      Intake/Output Summary (Last 24 hours) at 05/18/2022 1201 Last data filed at 05/17/2022 2122 Gross per 24 hour  Intake --  Output 250 ml  Net -250 ml    Filed Weights   05/18/22 0100  Weight: 87.4 kg    Examination:  General exam: NAD.  Cold sore noted left lower lip. Respiratory system: Decreased diffuse coarse breath sounds.  No wheezing.  Fair air movement.  Speaking full sentences.  Dry mucous membranes.  Cardiovascular system: RRR no murmurs rubs or gallops.  No JVD.  No significant lower extremity edema.  Gastrointestinal system: Abdomen is soft, nontender, nondistended, positive bowel sounds.  No rebound.  No guarding.  Central nervous system: Alert and oriented. No focal neurological deficits. Extremities: Symmetric 5 x 5 power. Skin: No rashes, lesions or ulcers Psychiatry: Judgement and insight appear fair. Mood & affect appropriate.  Data Reviewed: I have personally reviewed following labs and imaging studies  CBC: Recent Labs  Lab 05/13/22 2111 05/14/22 0830 05/15/22 0352 05/16/22 0549 05/17/22 0718 05/18/22 0545  WBC 19.2* 20.6* 19.6* 16.1* 14.7* 11.0*  NEUTROABS 17.1* 17.4* 17.7* 14.2* 12.2*  --   HGB 12.8 11.6* 10.4* 10.9* 12.0 10.0*  HCT 38.9 35.6* 31.9* 34.3* 38.5 31.8*  MCV 88.8 87.9 88.9 90.3 92.8 92.4  PLT PLATELET CLUMPS NOTED ON SMEAR, UNABLE TO ESTIMATE PLATELET CLUMPS NOTED ON SMEAR, UNABLE TO ESTIMATE PLATELET CLUMPS NOTED ON SMEAR, UNABLE TO ESTIMATE PLATELET CLUMPS NOTED ON SMEAR, UNABLE TO ESTIMATE PLATELET CLUMPS NOTED ON SMEAR, UNABLE TO ESTIMATE  PLATELET CLUMPS NOTED ON SMEAR, UNABLE TO ESTIMATE     Basic Metabolic Panel: Recent Labs  Lab 05/13/22 2344 05/14/22 0254 05/14/22 0300 05/14/22 0700 05/15/22 0352 05/16/22 0549 05/17/22 0718 05/18/22 0545  NA  --   --    < > 135 131* 132* 134* 133*  K  --   --    < > 3.9 4.2 3.8 4.0 4.8  CL  --   --    < > 104 95* 98 99 100  CO2  --   --    < > '25 26 28 29 27  '$ GLUCOSE  --   --    < > 113* 246* 214* 153* 185*  BUN  --   --    < > 51* 34* 27* 20 37*  CREATININE  --   --    < > 0.94 0.91 0.86 0.87 1.28*  CALCIUM  --   --    < > 8.2* 8.6* 8.5* 8.6* 8.4*  MG 1.5* 1.6*  --  1.6* 2.0  --  1.9 1.9  PHOS 2.5 2.7  --   --  2.5  --   --   --    < > = values in this interval not displayed.     GFR: Estimated Creatinine Clearance: 31.9 mL/min (A) (by C-G formula based on SCr of 1.28 mg/dL (H)).  Liver Function Tests: Recent Labs  Lab 05/13/22 2111 05/15/22 0352  AST 21  --   ALT 24  --   ALKPHOS 150*  --   BILITOT 1.4*  --   PROT 6.8  --   ALBUMIN 3.7 3.0*     CBG: Recent Labs  Lab 05/17/22 1306 05/17/22 1720 05/17/22 2105 05/18/22 0728 05/18/22 1149  GLUCAP 221* 292* 165* 170* 260*      Recent Results (from the past 240 hour(s))  Urine Culture     Status: Abnormal   Collection Time: 05/13/22  9:33 PM   Specimen: In/Out Cath Urine  Result Value Ref Range Status   Specimen Description   Final    IN/OUT CATH URINE Performed at [facility], [place] [address] [place], [place] [address]    Special Requests   Final    NONE Performed at [facility], [place] [address] [place],  [address]    Culture >=100,000 COLONIES/mL ESCHERICHIA COLI (A)  Final   Report Status 05/17/2022 FINAL  Final   Organism ID, Bacteria ESCHERICHIA COLI (A)  Final      Susceptibility   Escherichia coli - MIC*    AMPICILLIN >=32 RESISTANT Resistant     CEFAZOLIN >=64 RESISTANT Resistant     CEFEPIME <=0.12 SENSITIVE Sensitive     CEFTRIAXONE  <=0.25 SENSITIVE Sensitive     CIPROFLOXACIN <=0.25 SENSITIVE Sensitive  GENTAMICIN <=1 SENSITIVE Sensitive     IMIPENEM <=0.25 SENSITIVE Sensitive     NITROFURANTOIN <=16 SENSITIVE Sensitive     TRIMETH/SULFA <=20 SENSITIVE Sensitive     AMPICILLIN/SULBACTAM >=32 RESISTANT Resistant     PIP/TAZO 8 SENSITIVE Sensitive     * >=100,000 COLONIES/mL ESCHERICHIA COLI          Radiology Studies: No results found.      Scheduled Meds:  azithromycin  250 mg Oral Daily   fluticasone  2 spray Each Nare Daily   guaiFENesin  1,200 mg Oral BID   hydrocortisone  25 mg Rectal BID   insulin aspart  0-9 Units Subcutaneous TID WC   insulin glargine-yfgn  20 Units Subcutaneous Daily   loratadine  10 mg Oral Daily   Netarsudil-Latanoprost  1 drop Both Eyes QHS   pantoprazole  40 mg Oral Daily   rivaroxaban  15 mg Oral BID   Followed by   Derrill Memo ON 05/23/2022] rivaroxaban  20 mg Oral Q supper   zinc sulfate  220 mg Oral Daily   Continuous Infusions:  sodium chloride 75 mL/hr at 05/18/22 5638   sodium chloride       LOS: 4 days    Time spent: 35 minutes    Irine Seal, MD [facility]   To contact the attending provider between 7A-7P or the covering provider during after hours 7P-7A, please log into the web site [email or web address] and access using universal [place] password for that web site. If you do not have the password, please call the hospital operator.  05/18/2022, 12:01 PM

## 2022-05-18 NOTE — Care Management Important Message (Signed)
Important Message  Patient Details IM Letter placed in Patient's room. Name: MACAYLA EKDAHL MRN: [id number] Date of Birth: Apr 28, 1930   Medicare Important Message Given:  Yes     Kerin Salen 05/18/2022, 1:29 PM

## 2022-05-18 NOTE — Progress Notes (Signed)
PT refused cpap tonight, state she tried it last night and just can't take it.

## 2022-05-18 NOTE — Progress Notes (Addendum)
Pt has been coughing through the night. RN administered PRN Robitussin, but pt continuously coughed. Pt's voice is very hoarse from coughs. Then pt complained of sore throat due to the coughing. No wheezing  and no SOB.Phenol given for sore throat. Notify attending on- call and received a new order. Will continue to monitor.

## 2022-05-18 NOTE — Progress Notes (Signed)
ANTICOAGULATION CONSULT NOTE - Follow Up Consult  Pharmacy Consult for Xarelt Indication: DVT  Allergies  Allergen Reactions   Cephalexin Anaphylaxis, Swelling and Other (See Comments)   Micardis [Telmisartan] Other (See Comments)    Hyperkalemia during hospitalization with pulmonary edema   Atorvastatin Other (See Comments)    Other reaction(s): myalgia   Buspirone Other (See Comments)    Other reaction(s): "couldn't breath"   Canagliflozin Other (See Comments)    Pt does not recall   Doxycycline Hyclate Other (See Comments)    Other reaction(s): nausea   Lipitor [Atorvastatin Calcium] Other (See Comments)    myalgia   Tramadol Nausea Only   Tramadol Hcl Other (See Comments)    Patient Measurements: Height: '5\' 4"'$  (162.6 cm) Weight: 87.4 kg (192 lb 10.9 oz) IBW/kg (Calculated) : 54.7  Vital Signs: Temp: 98.6 F (37 C) (01/22 0524) Temp Source: Oral (01/22 0524) BP: 133/49 (01/22 0524) Pulse Rate: 72 (01/22 0524)  Labs: Recent Labs    05/16/22 0549 05/17/22 0718 05/18/22 0545  HGB 10.9* 12.0 10.0*  HCT 34.3* 38.5 31.8*  PLT PLATELET CLUMPS NOTED ON SMEAR, UNABLE TO ESTIMATE PLATELET CLUMPS NOTED ON SMEAR, UNABLE TO ESTIMATE PLATELET CLUMPS NOTED ON SMEAR, UNABLE TO ESTIMATE  CREATININE 0.86 0.87 1.28*    Estimated Creatinine Clearance: 31.9 mL/min (A) (by C-G formula based on SCr of 1.28 mg/dL (H)).  Assessment: Active Problem(s): recently admitted with COVID, +DVT.  Was discharged home on 1/11 with RX for Eliquis starter pack but pharmacy did not have this so has not gotten it filled (for past 6-7d).  Had f/u visit with PCP today and glucose >500, K 6.4.  PCP gave her Xarelto starter pack and referred to ED. Not yet filled on refill history  AC/Heme: Xarelto new start for DVT.  Education done. Hgb down to 10.   Goal of Therapy:  Therapeutic oral anticoagulation  Plan:  Xarelto '15mg'$  BID x 21d then '20mg'$  daily Should already have starter pack script from PCP?  Maybe we should try to fill this for her prior to discharge.   Mertis Mosher S. Alford Highland, PharmD, BCPS Clinical Staff Pharmacist [email or web address] Alford Highland, Marisue Canion Stillinger 05/18/2022,8:25 AM

## 2022-05-19 ENCOUNTER — Other Ambulatory Visit: Payer: Self-pay | Admitting: Cardiology

## 2022-05-19 DIAGNOSIS — I10 Essential (primary) hypertension: Secondary | ICD-10-CM

## 2022-05-19 DIAGNOSIS — N179 Acute kidney failure, unspecified: Secondary | ICD-10-CM | POA: Diagnosis not present

## 2022-05-19 DIAGNOSIS — J9611 Chronic respiratory failure with hypoxia: Secondary | ICD-10-CM | POA: Diagnosis not present

## 2022-05-19 DIAGNOSIS — I5032 Chronic diastolic (congestive) heart failure: Secondary | ICD-10-CM | POA: Diagnosis not present

## 2022-05-19 DIAGNOSIS — E1100 Type 2 diabetes mellitus with hyperosmolarity without nonketotic hyperglycemic-hyperosmolar coma (NKHHC): Secondary | ICD-10-CM | POA: Diagnosis not present

## 2022-05-19 LAB — CBC WITH DIFFERENTIAL/PLATELET
Abs Immature Granulocytes: 0.07 10*3/uL (ref 0.00–0.07)
Basophils Absolute: 0 10*3/uL (ref 0.0–0.1)
Basophils Relative: 0 %
Eosinophils Absolute: 0.1 10*3/uL (ref 0.0–0.5)
Eosinophils Relative: 1 %
HCT: 28.6 % — ABNORMAL LOW (ref 36.0–46.0)
Hemoglobin: 9.1 g/dL — ABNORMAL LOW (ref 12.0–15.0)
Immature Granulocytes: 1 %
Lymphocytes Relative: 14 %
Lymphs Abs: 1.1 10*3/uL (ref 0.7–4.0)
MCH: 29.4 pg (ref 26.0–34.0)
MCHC: 31.8 g/dL (ref 30.0–36.0)
MCV: 92.3 fL (ref 80.0–100.0)
Monocytes Absolute: 0.7 10*3/uL (ref 0.1–1.0)
Monocytes Relative: 8 %
Neutro Abs: 6 10*3/uL (ref 1.7–7.7)
Neutrophils Relative %: 76 %
Platelets: 62 10*3/uL — ABNORMAL LOW (ref 150–400)
RBC: 3.1 MIL/uL — ABNORMAL LOW (ref 3.87–5.11)
RDW: 13.9 % (ref 11.5–15.5)
WBC: 8 10*3/uL (ref 4.0–10.5)
nRBC: 0 % (ref 0.0–0.2)

## 2022-05-19 LAB — BASIC METABOLIC PANEL
Anion gap: 5 (ref 5–15)
BUN: 30 mg/dL — ABNORMAL HIGH (ref 8–23)
CO2: 26 mmol/L (ref 22–32)
Calcium: 8.1 mg/dL — ABNORMAL LOW (ref 8.9–10.3)
Chloride: 104 mmol/L (ref 98–111)
Creatinine, Ser: 1.09 mg/dL — ABNORMAL HIGH (ref 0.44–1.00)
GFR, Estimated: 49 mL/min — ABNORMAL LOW (ref >60)
Glucose, Bld: 161 mg/dL — ABNORMAL HIGH (ref 70–99)
Potassium: 4 mmol/L (ref 3.5–5.1)
Sodium: 135 mmol/L (ref 135–145)

## 2022-05-19 LAB — GLUCOSE, CAPILLARY
Glucose-Capillary: 135 mg/dL — ABNORMAL HIGH (ref 70–99)
Glucose-Capillary: 151 mg/dL — ABNORMAL HIGH (ref 70–99)
Glucose-Capillary: 99 mg/dL (ref 70–99)
Glucose-Capillary: 199 mg/dL — ABNORMAL HIGH (ref 70–99)

## 2022-05-19 MED ORDER — SODIUM CHLORIDE 0.9 % IV SOLN
1.0000 g | Freq: Three times a day (TID) | INTRAVENOUS | Status: DC
  Filled 2022-05-19: qty 5

## 2022-05-19 MED ORDER — SODIUM CHLORIDE 0.9 % IV SOLN: INTRAVENOUS | Status: AC

## 2022-05-19 MED ORDER — METRONIDAZOLE 500 MG/100ML IV SOLN
500.0000 mg | Freq: Two times a day (BID) | INTRAVENOUS | Status: DC
  Administered 2022-05-19: 500 mg via INTRAVENOUS
  Filled 2022-05-19: qty 100

## 2022-05-19 MED ORDER — SODIUM CHLORIDE 0.9 % IV SOLN
3.0000 g | Freq: Three times a day (TID) | INTRAVENOUS | Status: DC
  Administered 2022-05-19 – 2022-05-20 (×3): 3 g via INTRAVENOUS
  Filled 2022-05-19 (×5): qty 8

## 2022-05-19 NOTE — Progress Notes (Signed)
PROGRESS NOTE    [name]  ZOX:[id number] DOB: 11-18-1930 DOA: 05/13/2022 PCP: [name], DO    Chief Complaint  Patient presents with   Abnormal Lab    Brief Narrative: Patient is a 89 year old female history of hypertension, hyperlipidemia, chronic respiratory failure/COPD on 4 L nasal cannula, diastolic CHF, recently diagnosed bilateral lower extremity DVT, recent history of COVID, went to follow-up with PCP on day of admission, noted to have been unable to get her Eliquis for the past 6 days since discharge as pharmacy did not have it available, patient started on Xarelto per PCP.  Patient seen at PCPs office noted to have hyperkalemia given [facility], also noted to be hyperglycemic, noted to have some confusion and subsequently presented to the ED.  Patient seen in the ED placed on Endo tool/insulin drip.  Hyperkalemia corrected.  Hospitalist asked to admit the patient.   Assessment & Plan:   Principal Problem:   Hyperosmolar hyperglycemic state (HHS) ([place]) Active Problems:   AKI (acute kidney injury) ([place])   Chronic diastolic CHF (congestive heart failure) (H[facility])   Diabetes mellitus ([place])   Essential hypertension   Chronic obstructive pulmonary disease, unspecified (HCC)   Obstructive sleep apnea syndrome   Chronic hypoxic respiratory failure ([facility])   History of DVT (deep vein thrombosis)   Elevated lactic acid level   Hyperkalemia   Acute bronchitis   UTI (urinary tract infection)   Pressure injury of skin   Rectal bleeding  #1 hyperosmolar hyperglycemic state/diabetes mellitus type 2 -Likely secondary to recent steroids in the setting of underlying diabetes mellitus. -Urinalysis done on admission with glycosuria, small hemoglobin, small leukocytes. -Basic metabolic profile done with a glucose of 861, sodium of 122, potassium of 6.0, chloride of 87, creatinine of 1.32, anion gap of 12, bicarb of 23.  Magnesium noted at 1.5.   -Serum osmolality of 329, lactic acid  level elevated at 5.2 and trended down. -CBC on admission with a white count of 19.2 otherwise within normal limits. -Patient placed on Endo tool/insulin drip, blood glucose levels improved, normal anion gap, patient subsequently transitioned to subcutaneous insulin and SSI. -Hemoglobin A1c 10.3 (05/03/2022). -CBG at 135 this morning. -Continue Semglee to 20 units daily, SSI, carb modified diet.   -Diabetes coordinator following.   2.  Hyperkalemia -Likely secondary to hyperosmolar state.  -Potassium of 4.0. -Repeat labs in the AM.   3.  Acute kidney injury -Likely secondary to prerenal azotemia in the setting of hyperosmolar hyperglycemic state. -Renal function with slight bump in creatinine however trending back down with hydration.   -IV fluids.   4.  Chronic diastolic CHF -Currently euvolemic on examination. -Patient more on the dry side. -IV fluids.  5.  Recently diagnosed bilateral DVT -Patient noted not to have been able to get Eliquis when recently discharged as the pharmacy did not have it.  Patient noted to have been out of anticoagulation for the past 6 to 7 days as was unable to fill it as pharmacy did not have it on discharge during last hospitalization. -Patient started on Xarelto per PCP. -Continue Xarelto.   6.  Elevated lactic acid level -Likely secondary to dehydration. -Improved with hydration.  7.  Hypertension -BP is noted to be soft on admission. -Continue gentle hydration.   -Continue to hold oral antihypertensive medications.   -Follow.  8.  Chronic respiratory failure/COPD/probable acute bronchitis/probable aspiration pneumonia versus CAP. -On 4 L nasal cannula chronically at home currently stable. -Continue scheduled DuoNebs, Claritin, PPI,  Mucinex, Flonase. -Patient with ongoing cough, repeat chest x-ray done with mild basilar opacities likely atelectasis early on in the hospitalization. -Due to patient's drowsiness, rhonchorous cough, repeat chest  x-ray done 05/18/2022 with increased left lower lobe retrocardiac opacity concerning for pneumonia or aspiration.  COPD.  Aortic atherosclerosis.  Osteopenia, scoliosis and degenerative changes. -Patient with recent COVID, COPD, patient with a cough, patient status post full course of azithromycin.   -Due to concerns for aspiration pneumonia will place empirically on IV Unasyn and follow.   -Cepacol lozenges as needed. -Cough concerning for aspiration, SLP consulted and patient underwent MBS 05/18/2022.   -Patient continue on dysphagia 3 diet with nectar thick liquids.   9.  OSA -Noted to be noncompliant with CPAP.  10.  E. coli UTI Urine cultures with > 100,000 colonies of E coli. -Cultures resistant to ampicillin and cefazolin, sensitive to cefepime, Rocephin, Cipro, gentamicin, imipenem, nitrofurantoin, Bactrim, Zosyn.   -Status post fosfomycin x 1 dose.  11.  Rectal bleeding -Per RN patient with large bowel movement and subsequently had some rectal bleeding on 05/17/2022. -No further bloody bowel movement noted. -Hemoglobin currently at 9.1. -??  Hemorrhoidal bleeding. -Continue Anusol suppository twice daily.  12, pressure injury, medial coccyx stage II, POA Pressure Injury 05/14/22 Coccyx Medial Stage 2 -  Partial thickness loss of dermis presenting as a shallow open injury with a red, pink wound bed without slough. (Active)  05/14/22 2000  Location: Coccyx  Location Orientation: Medial  Staging: Stage 2 -  Partial thickness loss of dermis presenting as a shallow open injury with a red, pink wound bed without slough.  Wound Description (Comments):   Present on Admission: Yes        DVT prophylaxis: Xarelto Code Status: Full Family Communication: Updated patient.  Updated granddaughter at bedside.  Disposition: SNF versus home with home health.   Status is: Inpatient The patient will require care spanning > 2 midnights and should be moved to inpatient because: Severity of  illness   Consultants:  None  Procedures:  CT head 05/13/2022 Chest x-ray 05/13/2022, 05/15/2022   Antimicrobials: Azithromycin 05/15/2022>>> oral azithromycin 05/16/2022>>>> Fosfomycin x 1 dose 05/15/2022 IV Unasyn 05/19/2022>>>>>   Subjective: Patient drowsy but arousable.  Laying in bed.  Denies any significant shortness of breath.  Noted to have a laryngitis.  Denies any chest pain.  No abdominal pain.  Granddaughter at bedside who states patient with poor oral intake.  Patient with significant weakness.    Objective: Vitals:   05/18/22 2104 05/19/22 0137 05/19/22 0531 05/19/22 0932  BP: (!) 124/58 (!) 141/69 (!) 121/47   Pulse: 100 89 87   Resp: '17 17 20   '$ Temp: 98.2 F (36.8 C) 98.2 F (36.8 C) 98.2 F (36.8 C)   TempSrc: Oral Oral Oral   SpO2: 99% 95% 95% 95%  Weight:      Height:        Intake/Output Summary (Last 24 hours) at 05/19/2022 1132 Last data filed at 05/19/2022 0523 Gross per 24 hour  Intake 901.12 ml  Output 800 ml  Net 101.12 ml    Filed Weights   05/18/22 0100  Weight: 87.4 kg    Examination:  General exam: Drowsy.  Dry mucous membranes.  Cold sore noted on left lower lip.  Respiratory system: Diffuse coarse rhonchorous breath sounds.  No wheezing.  Fair air movement.  Speaking in full sentences.  Dry mucous membranes.  Cardiovascular system: Regular rate rhythm no murmurs rubs or gallops.  No JVD.  No lower extremity edema.  Gastrointestinal system: Abdomen is soft, nontender, nondistended, positive bowel sounds.  No rebound.  No guarding. Central nervous system: Alert and oriented. No focal neurological deficits. Extremities: Symmetric 5 x 5 power. Skin: No rashes, lesions or ulcers Psychiatry: Judgement and insight appear fair. Mood & affect appropriate.     Data Reviewed: I have personally reviewed following labs and imaging studies  CBC: Recent Labs  Lab 05/14/22 0830 05/15/22 0352 05/16/22 0549 05/17/22 0718 05/18/22 0545  05/19/22 0526  WBC 20.6* 19.6* 16.1* 14.7* 11.0* 8.0  NEUTROABS 17.4* 17.7* 14.2* 12.2*  --  6.0  HGB 11.6* 10.4* 10.9* 12.0 10.0* 9.1*  HCT 35.6* 31.9* 34.3* 38.5 31.8* 28.6*  MCV 87.9 88.9 90.3 92.8 92.4 92.3  PLT PLATELET CLUMPS NOTED ON SMEAR, UNABLE TO ESTIMATE PLATELET CLUMPS NOTED ON SMEAR, UNABLE TO ESTIMATE PLATELET CLUMPS NOTED ON SMEAR, UNABLE TO ESTIMATE PLATELET CLUMPS NOTED ON SMEAR, UNABLE TO ESTIMATE PLATELET CLUMPS NOTED ON SMEAR, UNABLE TO ESTIMATE 62*     Basic Metabolic Panel: Recent Labs  Lab 05/13/22 2344 05/14/22 0254 05/14/22 0300 05/14/22 0700 05/15/22 0352 05/16/22 0549 05/17/22 0718 05/18/22 0545 05/19/22 0526  NA  --   --    < > 135 131* 132* 134* 133* 135  K  --   --    < > 3.9 4.2 3.8 4.0 4.8 4.0  CL  --   --    < > 104 95* 98 99 100 104  CO2  --   --    < > '25 26 28 29 27 26  '$ GLUCOSE  --   --    < > 113* 246* 214* 153* 185* 161*  BUN  --   --    < > 51* 34* 27* 20 37* 30*  CREATININE  --   --    < > 0.94 0.91 0.86 0.87 1.28* 1.09*  CALCIUM  --   --    < > 8.2* 8.6* 8.5* 8.6* 8.4* 8.1*  MG 1.5* 1.6*  --  1.6* 2.0  --  1.9 1.9  --   PHOS 2.5 2.7  --   --  2.5  --   --   --   --    < > = values in this interval not displayed.     GFR: Estimated Creatinine Clearance: 37.5 mL/min (A) (by C-G formula based on SCr of 1.09 mg/dL (H)).  Liver Function Tests: Recent Labs  Lab 05/13/22 2111 05/15/22 0352  AST 21  --   ALT 24  --   ALKPHOS 150*  --   BILITOT 1.4*  --   PROT 6.8  --   ALBUMIN 3.7 3.0*     CBG: Recent Labs  Lab 05/18/22 0728 05/18/22 1149 05/18/22 1701 05/18/22 2121 05/19/22 0736  GLUCAP 170* 260* 229* 251* 135*      Recent Results (from the past 240 hour(s))  Urine Culture     Status: Abnormal   Collection Time: 05/13/22  9:33 PM   Specimen: In/Out Cath Urine  Result Value Ref Range Status   Specimen Description   Final    IN/OUT CATH URINE Performed at [facility], [place] [address],  [place], [place] [address]    Special Requests   Final    NONE Performed at [facility], [place] [address], [place], [place] [address]    Culture >=100,000 COLONIES/mL ESCHERICHIA COLI (A)  Final   Report Status  05/17/2022 FINAL  Final   Organism ID, Bacteria ESCHERICHIA COLI (A)  Final      Susceptibility   Escherichia coli - MIC*    AMPICILLIN >=32 RESISTANT Resistant     CEFAZOLIN >=64 RESISTANT Resistant     CEFEPIME <=0.12 SENSITIVE Sensitive     CEFTRIAXONE <=0.25 SENSITIVE Sensitive     CIPROFLOXACIN <=0.25 SENSITIVE Sensitive     GENTAMICIN <=1 SENSITIVE Sensitive     IMIPENEM <=0.25 SENSITIVE Sensitive     NITROFURANTOIN <=16 SENSITIVE Sensitive     TRIMETH/SULFA <=20 SENSITIVE Sensitive     AMPICILLIN/SULBACTAM >=32 RESISTANT Resistant     PIP/TAZO 8 SENSITIVE Sensitive     * >=100,000 COLONIES/mL ESCHERICHIA COLI          Radiology Studies: DG CHEST PORT 1 VIEW  Result Date: 05/19/2022 CLINICAL DATA:  [address] with cough. History of CHF, COPD and diabetes. EXAM: PORTABLE CHEST 1 VIEW COMPARISON:  Portable chest 05/15/2022, portable chest 05/13/2022, CTA chest 12/06/2019 FINDINGS: 2:59 p.m. There is increased left lower lobe opacity in the retrocardiac area concerning for pneumonia or aspiration. Remaining lungs hyperexpanded and otherwise clear. The sulci are sharp. There is mild cardiomegaly, stable vascular prominence of the right hilum, stable mediastinum with aortic tortuosity and calcification. There is osteopenia with degenerative changes and mild dextroscoliosis of the thoracic spine. IMPRESSION: 1. Increased left lower lobe retrocardiac opacity concerning for pneumonia or aspiration. 2. COPD. 3. Aortic atherosclerosis. 4. Osteopenia, scoliosis and degenerative changes. Electronically Signed   By: Telford Nab M.D.   On: 05/19/2022 03:55   DG Swallowing Func-Speech Pathology  Result Date: 05/18/2022 Table formatting from the original result was not  included. Objective Swallowing Evaluation: Type of Study: MBS-Modified Barium Swallow Study  Patient Details Name: ELZINA DEVERA MRN: [id number] Date of Birth: Sep 26, 1930 Today's Date: 05/18/2022 Time: SLP Start Time (ACUTE ONLY): 74 -SLP Stop Time (ACUTE ONLY): 5462 SLP Time Calculation (min) (ACUTE ONLY): 20 min Past Medical History: Past Medical History: Diagnosis Date  Cellulitis and abscess of left leg   CHF (congestive heart failure) (HCC)   COPD (chronic obstructive pulmonary disease) ([place])   Diabetes mellitus   Glaucoma   Hyperlipidemia   Hypertension  Past Surgical History: Past Surgical History: Procedure Laterality Date  BACK SURGERY    CARDIAC CATHETERIZATION N/A 12/09/2015  Procedure: Right Heart Cath;  Surgeon: Adrian Prows, MD;  Location: [facility];  Service: Cardiovascular;  Laterality: N/A;  CHOLECYSTECTOMY  2000  HERNIA REPAIR  2007  rupture disk  1970"s  SHOULDER ARTHROSCOPY WITH ROTATOR CUFF REPAIR  1999  rt shoulder HPI: Patient is a 89 year old female history of hypertension, hyperlipidemia, chronic respiratory failure/COPD on 4 L nasal cannula, diastolic CHF, recently diagnosed bilateral lower extremity DVT, recent history of COVID, went to follow-up with PCP on day of admission, noted to have been unable to get her Eliquis for the past 6 days since discharge as pharmacy did not have it available, patient started on Xarelto per PCP.  Patient seen at PCPs office noted to have hyperkalemia given [facility], also noted to be hyperglycemic, noted to have some confusion and subsequently presented to the ED.  Patient seen in the ED placed on Endo tool/insulin drip.  Subjective: pleasant, has been coughing all night and today as well  Recommendations for follow up therapy are one component of a multi-disciplinary discharge planning process, led by the attending physician.  Recommendations may be updated based on patient status, additional functional criteria and insurance authorization.  Assessment  / Plan / Recommendation   05/18/2022   3:40 PM Clinical Impressions Clinical Impression Patient presents with a mild pharyngeal phase dysphagia but with oral phase WFL. SLP used the following barium consistencies: thin, nectar thick, puree, 30m barium tablet. During pharyngeal phase, patient exhibited decreased hyolaryngeal movement leading to trace to mild vallecular and pyriform sinus residuals with thin liquids and mild vallecular sinus residuals, trace pyriform residuals with nectar thick liquid. No significant amount of PO residuals observed with puree solids taken with 126mbarium tablet. Patient exhibited trace penetration of thin liquid barium into laryngeal vestibule, however airway appeared well protected. Penetration was sensed by patient, who exhibited a throat clear and cough. Slightly increased amount of vallecular residuals observed with nectar thick liquid, but no penetration. With thin and nectar thick liquids, patient able to clear majority of barium residuals with 1-2 dry swallows and during penetration events, she was able to clear laryngeal vestibule with cued cough and reswallow. No aspiration observed with any of the tested barium consistencies.  As patient continues with significant amount of coughing, with some of this coughing likely due to sensed penetration, SLP recommending to continue with nectar thick liquids at this time but SLP will plan for trials of upgraded liquids (thin consistency with anticipation of good progress overall. SLP Visit Diagnosis Dysphagia, pharyngeal phase (R13.13);Dysphagia, pharyngoesophageal phase (R13.14) Impact on safety and function Mild aspiration risk     05/18/2022   3:40 PM Treatment Recommendations Treatment Recommendations Therapy as outlined in treatment plan below     05/18/2022   3:44 PM Prognosis Prognosis for Safe Diet Advancement Good   05/18/2022   3:40 PM Diet Recommendations SLP Diet Recommendations Dysphagia 3 (Mech soft) solids;Nectar thick  liquid Liquid Administration via Cup;Straw Medication Administration Whole meds with puree Compensations Slow rate;Small sips/bites Postural Changes Seated upright at 90 degrees     05/18/2022   3:40 PM Other Recommendations Oral Care Recommendations Oral care BID Follow Up Recommendations Follow physician's recommendations for discharge plan and follow up therapies Functional Status Assessment Patient has had a recent decline in their functional status and demonstrates the ability to make significant improvements in function in a reasonable and predictable amount of time.   05/18/2022   3:40 PM Frequency and Duration  Speech Therapy Frequency (ACUTE ONLY) min 2x/week Treatment Duration 1 week     05/18/2022   3:38 PM Oral [facility]  05/18/2022   3:38 PM Pharyngeal Phase Pharyngeal Phase Impaired Pharyngeal- Thin Straw Reduced anterior laryngeal mobility;Penetration/Aspiration during swallow;Pharyngeal residue - valleculae;Pharyngeal residue - pyriform Pharyngeal Material enters airway, remains ABOVE vocal cords then ejected out;Material does not enter airway Pharyngeal- Puree Reduced pharyngeal peristalsis Pharyngeal- Pill [facility]  05/18/2022   3:39 PM Cervical Esophageal Phase  Cervical Esophageal Phase Impaired Cervical Esophageal Comment appearance of cricopharyngeal bar which did not appear to significantly impact barium transist at level of cervical esophagus JoSonia BallerMA, CCC-SLP Speech Therapy                          Scheduled Meds:  fluticasone  2 spray Each Nare Daily   guaiFENesin  1,200 mg Oral BID   hydrocortisone  25 mg Rectal BID   insulin aspart  0-9 Units Subcutaneous TID WC   insulin glargine-yfgn  20 Units Subcutaneous Daily   loratadine  10 mg Oral Daily   Netarsudil-Latanoprost  1 drop Both Eyes QHS  pantoprazole  40 mg Oral Daily   rivaroxaban  15 mg Oral BID   Followed by   Derrill Memo ON 05/23/2022] rivaroxaban  20 mg Oral Q supper   valACYclovir  1,000 mg Per Tube  TID   zinc sulfate  220 mg Oral Daily   Continuous Infusions:  sodium chloride 75 mL/hr at 05/18/22 2358   sodium chloride       LOS: 5 days    Time spent: 35 minutes    Irine Seal, MD [facility]   To contact the attending provider between 7A-7P or the covering provider during after hours 7P-7A, please log into the web site [email or web address] and access using universal [place] password for that web site. If you do not have the password, please call the hospital operator.  05/19/2022, 11:32 AM

## 2022-05-19 NOTE — Inpatient Diabetes Management (Signed)
Inpatient Diabetes Program Recommendations  AACE/ADA: New Consensus Statement on Inpatient Glycemic Control   Target Ranges:  Prepandial:   less than 140 mg/dL      Peak postprandial:   less than 180 mg/dL (1-2 hours)      Critically ill patients:  140 - 180 mg/dL    Latest Reference Range & Units 05/19/22 07:36  Glucose-Capillary 70 - 99 mg/dL 135 (H)    Latest Reference Range & Units 05/18/22 07:28 05/18/22 11:49 05/18/22 17:01 05/18/22 21:21  Glucose-Capillary 70 - 99 mg/dL 170 (H) 260 (H) 229 (H) 251 (H)   Review of Glycemic Control  Diabetes history: DM2 Outpatient Diabetes medications: Metformin 500 mg daily, Victoza 0.6 mg daily, Tresiba 15 units daily (not yet started) Current orders for Inpatient glycemic control: Semglee 20 units daily, Novolog 0-9 units TID with meals  Inpatient Diabetes Program Recommendations:    Insulin: Please consider ordering Novolog 3 units TID with meals for meal coverage if patient eats at least 50% of meals.  Thanks, Barnie Alderman, RN, MSN, [place] Diabetes Coordinator Inpatient Diabetes Program [phone] (Team Pager from 8am to [place])

## 2022-05-19 NOTE — Plan of Care (Signed)
  Problem: Fluid Volume: Goal: Ability to maintain a balanced intake and output will improve Outcome: Progressing   Problem: Metabolic: Goal: Ability to maintain appropriate glucose levels will improve Outcome: Progressing   Problem: Skin Integrity: Goal: Risk for impaired skin integrity will decrease Outcome: Progressing

## 2022-05-19 NOTE — Progress Notes (Signed)
Pharmacy Antibiotic Note  [name] is a 89 y.o. female admitted on 05/13/2022 with  aspiration PNA .  Pharmacy has been consulted for Unasyn dosing.  ID: Covid and AECOPD + Ecoli UTI. Suspect possible new aspiration.  - Afebrile. WBC down to 8. Scr 1. CrCl 37 - Patient has had doses of Zosyn and Unasyn in the past with cephalosporin allergy  1/19 azith >> ( 1/23)  1/20 fosfomycin x 1  1/23  Unasyn>>  1/17 UCx: >100k colonies E.coli ( R to amp, Unasyn, cefazolin )   Plan: Unasyn 3g IV q8hr Discussed with Dr. Grandville Silos. Will monitor closely for any allergic cross-reactivity.  Height: '5\' 4"'$  (162.6 cm) Weight: 87.4 kg (192 lb 10.9 oz) IBW/kg (Calculated) : 54.7  Temp (24hrs), Avg:98.3 F (36.8 C), Min:97.5 F (36.4 C), Max:99.9 F (37.7 C)  Recent Labs  Lab 05/13/22 2350 05/14/22 0131 05/14/22 0300 05/14/22 0330 05/14/22 0523 05/14/22 0700 05/15/22 0352 05/16/22 0549 05/17/22 0718 05/18/22 0545 05/19/22 0526  WBC  --   --   --   --   --    < > 19.6* 16.1* 14.7* 11.0* 8.0  CREATININE  --   --    < >  --   --    < > 0.91 0.86 0.87 1.28* 1.09*  LATICACIDVEN 5.2* 3.6*  --  2.4* 1.6  --   --   --   --   --   --    < > = values in this interval not displayed.    Estimated Creatinine Clearance: 37.5 mL/min (A) (by C-G formula based on SCr of 1.09 mg/dL (H)).    Allergies  Allergen Reactions   Cephalexin Anaphylaxis, Swelling and Other (See Comments)   Micardis [Telmisartan] Other (See Comments)    Hyperkalemia during hospitalization with pulmonary edema   Atorvastatin Other (See Comments)    Other reaction(s): myalgia   Buspirone Other (See Comments)    Other reaction(s): "couldn't breath"   Canagliflozin Other (See Comments)    Pt does not recall   Doxycycline Hyclate Other (See Comments)    Other reaction(s): nausea   Lipitor [Atorvastatin Calcium] Other (See Comments)    myalgia   Tramadol Nausea Only   Tramadol Hcl Other (See Comments)    . Song Garris S.  Alford Highland, PharmD, BCPS Clinical Staff Pharmacist [email or web address] [place] 05/19/2022 12:36 PM

## 2022-05-19 NOTE — Progress Notes (Signed)
Physical Therapy Treatment Patient Details Name: [name] MRN: [id number] DOB: [birth year] Today's Date: 05/19/2022   History of Present Illness Patient is a pleasant 89 year old female with history of chronic respiratory failure secondary to COPD on chronic home O2 4 L nasal cannula, type 2 diabetes, CKD stage IIIb, glaucoma, hypertension, hyperlipidemia presented with a 1 week history of cough shortness of breath. Patient admitted for COVID-19 infection as well as an acute COPD exacerbation.    PT Comments    Pt extremely sleepy/lethargic on arrival, aroused with multi-modal stimuli. Agreeable to OOB, able to tol repeated STS and Le exercises. Alert and eating lunch on PT departure. Continue to recommend SNF for post acute rehab  Recommendations for follow up therapy are one component of a multi-disciplinary discharge planning process, led by the attending physician.  Recommendations may be updated based on patient status, additional functional criteria and insurance authorization.  Follow Up Recommendations  Skilled nursing-short term rehab (<3 hours/day) Can patient physically be transported by private vehicle: No   Assistance Recommended at Discharge Intermittent Supervision/Assistance  Patient can return home with the following A little help with walking and/or transfers;A little help with bathing/dressing/bathroom;Help with stairs or ramp for entrance;Assistance with cooking/housework;Assist for transportation   Equipment Recommendations  None recommended by PT    Recommendations for Other Services       Precautions / Restrictions Precautions Precautions: Other (comment)     Mobility  Bed Mobility Overal bed mobility: Needs Assistance Bed Mobility: Supine to Sit     Supine to sit: Min assist, HOB elevated     General bed mobility comments: incr time, assist to elevate trunk, use of rail    Transfers Overall transfer level: Needs assistance Equipment used:  Rolling walker (2 wheels) Transfers: Sit to/from Stand, Bed to chair/wheelchair/BSC Sit to Stand: Min assist   Step pivot transfers: Min assist       General transfer comment: STS x2; assist to rise from EOB.  able to take pivotal steps to chair with min assist to steady    Ambulation/Gait                   Stairs             Wheelchair Mobility    Modified Rankin (Stroke Patients Only)       Balance                                            Cognition Arousal/Alertness: Awake/alert Behavior During Therapy: WFL for tasks assessed/performed Overall Cognitive Status: Within Functional Limits for tasks assessed                                          Exercises General Exercises - Lower Extremity Ankle Circles/Pumps: AROM, Both, 15 reps Long Arc Quad: AROM, Both, 10 reps Hip ABduction/ADduction: AROM, Both, 10 reps    General Comments        Pertinent Vitals/Pain Pain Assessment Pain Assessment: Faces Faces Pain Scale: Hurts little more Pain Location: throat Pain Descriptors / Indicators: Discomfort, Sore Pain Intervention(s): Monitored during session    Home Living  Prior Function            PT Goals (current goals can now be found in the care plan section) Acute Rehab PT Goals Patient Stated Goal: home PT Goal Formulation: With patient Time For Goal Achievement: 05/31/22 Potential to Achieve Goals: Good Progress towards PT goals: Progressing toward goals    Frequency    Min 2X/week      PT Plan Current plan remains appropriate    Co-evaluation              AM-PAC PT "6 Clicks" Mobility   Outcome Measure  Help needed turning from your back to your side while in a flat bed without using bedrails?: A Little Help needed moving from lying on your back to sitting on the side of a flat bed without using bedrails?: A Little Help needed moving to and from  a bed to a chair (including a wheelchair)?: A Little Help needed standing up from a chair using your arms (e.g., wheelchair or bedside chair)?: A Little Help needed to walk in hospital room?: A Lot Help needed climbing 3-5 steps with a railing? : Total 6 Click Score: 15    End of Session Equipment Utilized During Treatment: Gait belt Activity Tolerance: Patient tolerated treatment well Patient left: in chair;with call bell/phone within reach;with chair alarm set   PT Visit Diagnosis: Difficulty in walking, not elsewhere classified (R26.2)     Time: 0045-9977 PT Time Calculation (min) (ACUTE ONLY): 23 min  Charges:  $Therapeutic Activity: 23-37 mins                     Baxter Flattery, PT  Acute Rehab Dept [facility]) [phone]  [facility] Weekend Pager (Saturday/Sunday only)  [phone]  05/19/2022    [facility] 05/19/2022, 3:46 PM

## 2022-05-19 NOTE — Progress Notes (Signed)
PT refused CPAP.  

## 2022-05-20 DIAGNOSIS — I5032 Chronic diastolic (congestive) heart failure: Secondary | ICD-10-CM | POA: Diagnosis not present

## 2022-05-20 DIAGNOSIS — E1100 Type 2 diabetes mellitus with hyperosmolarity without nonketotic hyperglycemic-hyperosmolar coma (NKHHC): Secondary | ICD-10-CM | POA: Diagnosis not present

## 2022-05-20 DIAGNOSIS — J9611 Chronic respiratory failure with hypoxia: Secondary | ICD-10-CM | POA: Diagnosis not present

## 2022-05-20 DIAGNOSIS — N179 Acute kidney failure, unspecified: Secondary | ICD-10-CM | POA: Diagnosis not present

## 2022-05-20 LAB — BASIC METABOLIC PANEL
Anion gap: 7 (ref 5–15)
BUN: 23 mg/dL (ref 8–23)
CO2: 25 mmol/L (ref 22–32)
Calcium: 7.9 mg/dL — ABNORMAL LOW (ref 8.9–10.3)
Chloride: 105 mmol/L (ref 98–111)
Creatinine, Ser: 0.99 mg/dL (ref 0.44–1.00)
GFR, Estimated: 55 mL/min — ABNORMAL LOW (ref >60)
Glucose, Bld: 205 mg/dL — ABNORMAL HIGH (ref 70–99)
Potassium: 4.2 mmol/L (ref 3.5–5.1)
Sodium: 137 mmol/L (ref 135–145)

## 2022-05-20 LAB — CBC WITH DIFFERENTIAL/PLATELET
Abs Immature Granulocytes: 0.05 10*3/uL (ref 0.00–0.07)
Basophils Absolute: 0 10*3/uL (ref 0.0–0.1)
Basophils Relative: 0 %
Eosinophils Absolute: 0.2 10*3/uL (ref 0.0–0.5)
Eosinophils Relative: 2 %
HCT: 29.1 % — ABNORMAL LOW (ref 36.0–46.0)
Hemoglobin: 9 g/dL — ABNORMAL LOW (ref 12.0–15.0)
Immature Granulocytes: 1 %
Lymphocytes Relative: 12 %
Lymphs Abs: 0.9 10*3/uL (ref 0.7–4.0)
MCH: 29.5 pg (ref 26.0–34.0)
MCHC: 30.9 g/dL (ref 30.0–36.0)
MCV: 95.4 fL (ref 80.0–100.0)
Monocytes Absolute: 0.6 10*3/uL (ref 0.1–1.0)
Monocytes Relative: 7 %
Neutro Abs: 6.2 10*3/uL (ref 1.7–7.7)
Neutrophils Relative %: 78 %
Platelets: UNDETERMINED 10*3/uL (ref 150–400)
RBC: 3.05 MIL/uL — ABNORMAL LOW (ref 3.87–5.11)
RDW: 13.9 % (ref 11.5–15.5)
WBC: 7.9 10*3/uL (ref 4.0–10.5)
nRBC: 0 % (ref 0.0–0.2)

## 2022-05-20 LAB — GLUCOSE, CAPILLARY
Glucose-Capillary: 194 mg/dL — ABNORMAL HIGH (ref 70–99)
Glucose-Capillary: 152 mg/dL — ABNORMAL HIGH (ref 70–99)
Glucose-Capillary: 117 mg/dL — ABNORMAL HIGH (ref 70–99)
Glucose-Capillary: 165 mg/dL — ABNORMAL HIGH (ref 70–99)

## 2022-05-20 MED ORDER — SODIUM CHLORIDE 0.9 % IV SOLN
3.0000 g | Freq: Three times a day (TID) | INTRAVENOUS | Status: DC
  Administered 2022-05-20 – 2022-05-22 (×7): 3 g via INTRAVENOUS
  Filled 2022-05-20 (×7): qty 8

## 2022-05-20 NOTE — Progress Notes (Signed)
Occupational Therapy Treatment Patient Details Name: [name] MRN: [id number] DOB: 1930-09-28 Today's Date: 05/20/2022   History of present illness Patient is a pleasant 89 year old female with history of chronic respiratory failure secondary to COPD on chronic home O2 4 L nasal cannula, type 2 diabetes, CKD stage IIIb, glaucoma, hypertension, hyperlipidemia presented with a 1 week history of cough shortness of breath. Patient admitted for COVID-19 infection as well as an acute COPD exacerbation.   OT comments  Patient found on 3 L [place] in bed. Patient drowsy and required some stimulation to awake fully and participate. Today she was min assist to transfer to edge of bed and mod assist to stand and pivot to recliner. Total assist for toileting and LB ADLs. She presents with increased weakness, poor tolerance and impaired balance. Patient now requires increased assistance and needs short term rehab at discharge.    Recommendations for follow up therapy are one component of a multi-disciplinary discharge planning process, led by the attending physician.  Recommendations may be updated based on patient status, additional functional criteria and insurance authorization.    Follow Up Recommendations  Skilled nursing-short term rehab (<3 hours/day)     Assistance Recommended at Discharge Frequent or constant Supervision/Assistance  Patient can return home with the following  A lot of help with walking and/or transfers;A lot of help with bathing/dressing/bathroom;Assistance with cooking/housework;Direct supervision/assist for medications management;Assist for transportation;Help with stairs or ramp for entrance;Direct supervision/assist for financial management   Equipment Recommendations  None recommended by OT    Recommendations for Other Services      Precautions / Restrictions Precautions Precaution Comments: monitor o2 Restrictions Weight Bearing Restrictions: No       Mobility  Bed Mobility                    Transfers                         Balance Overall balance assessment: Needs assistance Sitting-balance support: No upper extremity supported, Feet supported Sitting balance-Leahy Scale: Fair     Standing balance support: During functional activity, Reliant on assistive device for balance Standing balance-Leahy Scale: Poor                             ADL either performed or assessed with clinical judgement   ADL Overall ADL's : Needs assistance/impaired                     Lower Body Dressing: Total assistance;Sit to/from stand Lower Body Dressing Details (indicate cue type and reason): to don socks     Toileting- Clothing Manipulation and Hygiene: Total assistance;Sit to/from stand Toileting - Clothing Manipulation Details (indicate cue type and reason): total assist for tioleting task - removing purewick and cleaning periarrea.     Functional mobility during ADLs: Moderate assistance General ADL Comments: Min assist to transfer to edge of bed. Mod assist to stand at edge of bed for pericare. Returned to seated position. Positioned recliner closer to patient could pivot - with mod assist. Patient used IS and FV in recliner with therapist coaching.    Extremity/Trunk Assessment Upper Extremity Assessment Upper Extremity Assessment: Generalized weakness   Lower Extremity Assessment Lower Extremity Assessment: Generalized weakness   Cervical / Trunk Assessment Cervical / Trunk Assessment: Kyphotic    Vision   Vision Assessment?: No apparent visual deficits   Perception  Praxis      Cognition Arousal/Alertness: Awake/alert Behavior During Therapy: WFL for tasks assessed/performed Overall Cognitive Status: No family/caregiver present to determine baseline cognitive functioning                                 General Comments: Very HOH. Doesn't remember therapist. Unable to answer  questions in regards to time durnig hospitilization. Has been lethargic/drowsy.        Exercises      Shoulder Instructions       General Comments      Pertinent Vitals/ Pain       Pain Assessment Pain Assessment: No/denies pain  Home Living                                          Prior Functioning/Environment              Frequency  Min 2X/week        Progress Toward Goals  OT Goals(current goals can now be found in the care plan section)  Progress towards OT goals: OT to reassess next treatment  Acute Rehab OT Goals OT Goal Formulation: With patient Time For Goal Achievement: 05/29/22 Potential to Achieve Goals: [place] Discharge plan remains appropriate    Co-evaluation                 AM-PAC OT "6 Clicks" Daily Activity     Outcome Measure   Help from another person eating meals?: A Little Help from another person taking care of personal grooming?: A Little Help from another person toileting, which includes using toliet, bedpan, or urinal?: Total Help from another person bathing (including washing, rinsing, drying)?: A Lot Help from another person to put on and taking off regular upper body clothing?: A Lot Help from another person to put on and taking off regular lower body clothing?: Total 6 Click Score: 12    End of Session Equipment Utilized During Treatment: Rolling walker (2 wheels);Oxygen  OT Visit Diagnosis: Muscle weakness (generalized) (M62.81)   Activity Tolerance Patient limited by fatigue   Patient Left in chair;with call bell/phone within reach;with chair alarm set   Nurse Communication Mobility status        Time: 0998-3382 OT Time Calculation (min): 21 min  Charges: OT General Charges $OT Visit: 1 Visit OT Treatments $Self Care/Home Management : 8-22 mins  Gustavo Lah, OTR/L [place]  Office [phone]   Lenward Chancellor 05/20/2022, 12:18 PM

## 2022-05-20 NOTE — Progress Notes (Signed)
Speech Language Pathology Treatment: Dysphagia  Patient Details Name: [name] MRN: [id number] DOB: January 21, 1931 Today's Date: 05/20/2022 Time: 7829-5621 SLP Time Calculation (min) (ACUTE ONLY): 42 min  Assessment / Plan / Recommendation Clinical Impression  Pt seen to address dysphagia goals. She is currently awake, just finished her breakfast- reported it tasted "good". Congested cough noted at baseline- not - productive. Pt states "There's nothing there". Verbal and demonstration cues to "hock" were not effective- but pt made good effort. No overt coughing observed with intake of nectar liquids - nectar juice x 6 ounces and thick milk 4 ounces. SLP provided trials of thin via tsp - after 3rd swallow, pt presented with congested cough - concerning for overt aspiration. Pt does admit to sore throat, vocal hoarseness, "problems getting things down" - dysphagia since admission - She admits to more issues swallowing liquids - causing her to "cough". Her intake of nectar liquids was adequate- thus hydration at this time, does not appear to be a concern with her diet restriction. Pt denies any sensation of retention in pharynx or esophagus with direct question cue. Did not recall having MBS nor if she sensed when tablet appeared to stall in esophagus. Pt did cough or clear her throat when she penetrated liquids, but this is not reliable due to her chronicity of coughing. In addition on MBS, she appeared wtih secretion retention that mixed with barium. Note pt's CXR worsening on left - concerning for aspiration. SLP questions if pt's prolonged hospital coarse, deconditioning, lack of mobility may contribute to her pna risk. Reviewed MBS with her, revieiwing flouro loops and provided swallow precaution signs. In addition had pt brush her teeth - does not appear done since admit - as she has unopened toothbrush. Recommend continue diet and allow single ice chips - but advise to monitor hydration closely  -incorporating frazier protocol as needed. Recommend consider palliative consult given prolonged hospital stay and CXR findings.     HPI HPI: Patient is a 89 year old female history of hypertension, hyperlipidemia, chronic respiratory failure/COPD on 4 L nasal cannula, diastolic CHF, recently diagnosed bilateral lower extremity DVT, recent history of COVID, went to follow-up with PCP on day of admission, noted to have been unable to get her Eliquis for the past 6 days since discharge as pharmacy did not have it available, patient started on Xarelto per PCP.  Patient seen at PCPs office noted to have hyperkalemia given [facility], also noted to be hyperglycemic, noted to have some confusion and subsequently presented to the ED.  Patient seen in the ED placed on Endo tool/insulin drip.      SLP Plan  Continue with current plan of care      Recommendations for follow up therapy are one component of a multi-disciplinary discharge planning process, led by the attending physician.  Recommendations may be updated based on patient status, additional functional criteria and insurance authorization.    Recommendations  Diet recommendations: Dysphagia 3 (mechanical soft);Nectar-thick liquid (ice chips - SMALL OK) Medication Administration: Whole meds with puree Supervision: Intermittent supervision to cue for compensatory strategies Compensations: Slow rate;Small sips/bites Postural Changes and/or Swallow Maneuvers: Seated upright 90 degrees;Upright 30-60 min after meal               BRUSH TEETH AM AND PM! Oral Care Recommendations: Oral care BID Follow Up Recommendations: Skilled nursing-short term rehab (<3 hours/day) SLP Visit Diagnosis: Dysphagia, pharyngeal phase (R13.13);Dysphagia, oral phase (R13.11) Plan: Continue with current plan of care  Kathleen Lime, MS [facility] SLP Acute Rehab Services Office [phone]   [name]  05/20/2022, 11:53 AM

## 2022-05-20 NOTE — TOC Progression Note (Signed)
Transition of Care [facility]) - Progression Note    Patient Details  Name: [name] MRN: [id number] Date of Birth: 09/24/1930  [facility] CM/SW Contact  Leeroy Cha, RN Phone Number: 05/20/2022, 2:44 PM  Clinical Narrativ Pt faxed out to area and surrounding area for possible snf placement.    Barriers to Discharge: Barriers Resolved  Expected Discharge Plan and Services                                   HH Arranged: RN, PT, OT, Social Work, Nurse's Aide [place] Agency: [place] Date [facility] Agency Contacted: 05/16/22   Representative spoke with at [place]: Cindie   Social Determinants of Health ([place]) Interventions [place]: No Food Insecurity (05/16/2022)  Housing: Low Risk  (05/03/2022)  Transportation Needs: No Transportation Needs (05/16/2022)  Utilities: Not At Risk (05/16/2022)  Tobacco Use: Low Risk  (05/13/2022)    Readmission Risk Interventions   Row Labels 05/07/2022    3:17 PM  Readmission Risk Prevention Plan   Section Header. No data exists in this row.   Transportation Screening   Complete  PCP or Specialist Appt within 3-5 Days   Complete  HRI or [place]   Complete  Social Work Consult for [place] Planning/Counseling   Complete  Palliative Care Screening   Not Applicable  Medication Review [employer])   Complete

## 2022-05-20 NOTE — Progress Notes (Signed)
[facility] PROGRESS NOTE    Progress Note  [name]  XBJ:[id number] DOB: Oct 03, 1930 DOA: 05/13/2022 PCP: Teresa Morning, DO     Brief Narrative:   [name] is an 89 y.o. female past medical history of essential hypertension, chronic respiratory failure with hypoxia/COPD on 4 L of oxygen, chronic diastolic heart failure recently diagnosed bilateral lower extremity DVT (PCP on the day of admission was unable to refill his Eliquis for days after discharge, started on Xarelto by his PCP in the PCPs office was noted to be hyperkalemic given Lokelma, was also found to be hyperglycemic and confused subsequently sent to the ED    Assessment/Plan:   Hyperosmolar hyperglycemic state (HHS) ([place]): In the setting of steroid use started on insulin drip. Hemoglobin A1c of 10.3. Now long-acting insulin plus sliding scale, blood glucose fairly controlled. PT evaluated the patient recommended skilled nursing facility.  Hyperkalemia: Likely due to hyperosmolar state: Corrected now 4.0.  Acute kidney injury: Likely prerenal azotemia in the setting of hyperglycemic hyperosmolar nonketotic state resolved with fluids rotation.  Chronic diastolic heart failure: Appears to be euvolemic KVO IV fluids.  Recently diagnosed bilateral DVTs: Was not able to get her Eliquis. PCP change her to Xarelto which she has been continued on in-house.  Elevated lactic acid: Likely due to dehydration resolved.  Essential hypertension: Antihypertensive medications were held Blood pressure is improved.  Chronic respiratory failure with hypoxia on 4 L of oxygen/with probable aspiration pneumonia: Repeated chest x-ray on 05/18/2022 showed increased left lower lobe infiltrate concern about aspiration. Started on IV Unasyn continue azithromycin, as leukocytosis is resolved has remained afebrile. Finish 5-day course.  Obstructive sleep apnea: Has been noncompliant with her CPAP.  E. coli  UTI: Urine culture grew more than 100,000 colonies status post fosfomycin treatment.  Rectal bleed: Single episode on 05/17/2022 no further bleeding since then hemoglobin has remained stable, question hemorrhoidal bleed.  Sacral decubitus ulcer stage II present on admission RN Pressure Injury Documentation: Pressure Injury 05/14/22 Coccyx Medial Stage 2 -  Partial thickness loss of dermis presenting as a shallow open injury with a red, pink wound bed without slough. (Active)  05/14/22 2000  Location: Coccyx  Location Orientation: Medial  Staging: Stage 2 -  Partial thickness loss of dermis presenting as a shallow open injury with a red, pink wound bed without slough.  Wound Description (Comments):   Present on Admission: Yes  Dressing Type Foam - Lift dressing to assess site every shift 05/20/22 0830    DVT prophylaxis: scd Family Communication:none Status is: Inpatient Remains inpatient appropriate because: Hyperglycemic hyperosmolar nonketotic state.    Code Status:     Code Status Orders  (From admission, onward)           Start     Ordered   05/13/22 2256  Full code  Continuous       Question:  By:  Answer:  Consent: discussion documented in EHR   05/13/22 2258           Code Status History     Date Active Date Inactive Code Status Order ID Comments User Context   05/03/2022 1320 05/07/2022 2011 Full Code [id number]  [name], DO ED   12/07/2019 0617 12/09/2019 1943 Full Code [id number]  [name], MD Inpatient   02/26/2019 1741 02/28/2019 1720 Full Code [id number]  [name], MD Inpatient   02/26/2019 1642 02/26/2019 1700 Full Code [id number]  [name], MD ED   11/03/2017 [phone]  11/04/2017 1535 Partial Code [id number]  Ivor Costa, MD ED   03/19/2017 1756 03/21/2017 1407 Full Code [id number]  [name], MD Inpatient   10/24/2016 1430 10/27/2016 1604 Full Code [id number]  [name], MD ED   07/20/2016 1027 07/21/2016 0325 Full Code [id number]  [name],  MD HOV         IV Access:   Peripheral IV   Procedures and diagnostic studies:   DG CHEST PORT 1 VIEW  Result Date: 05/19/2022 CLINICAL DATA:  10031 with cough. History of CHF, COPD and diabetes. EXAM: PORTABLE CHEST 1 VIEW COMPARISON:  Portable chest 05/15/2022, portable chest 05/13/2022, CTA chest 12/06/2019 FINDINGS: 2:59 p.m. There is increased left lower lobe opacity in the retrocardiac area concerning for pneumonia or aspiration. Remaining lungs hyperexpanded and otherwise clear. The sulci are sharp. There is mild cardiomegaly, stable vascular prominence of the right hilum, stable mediastinum with aortic tortuosity and calcification. There is osteopenia with degenerative changes and mild dextroscoliosis of the thoracic spine. IMPRESSION: 1. Increased left lower lobe retrocardiac opacity concerning for pneumonia or aspiration. 2. COPD. 3. Aortic atherosclerosis. 4. Osteopenia, scoliosis and degenerative changes. Electronically Signed   By: Telford Nab M.D.   On: 05/19/2022 03:55   DG Swallowing Func-Speech Pathology  Result Date: 05/18/2022 Table formatting from the original result was not included. Objective Swallowing Evaluation: Type of Study: MBS-Modified Barium Swallow Study  Patient Details Name: [name] MRN: [id number] Date of Birth: 1930-12-15 Today's Date: 05/18/2022 Time: SLP Start Time (ACUTE ONLY): 24 -SLP Stop Time (ACUTE ONLY): 1779 SLP Time Calculation (min) (ACUTE ONLY): 20 min Past Medical History: Past Medical History: Diagnosis Date  Cellulitis and abscess of left leg   CHF (congestive heart failure) (HCC)   COPD (chronic obstructive pulmonary disease) ([place])   Diabetes mellitus   Glaucoma   Hyperlipidemia   Hypertension  Past Surgical History: Past Surgical History: Procedure Laterality Date  BACK SURGERY    CARDIAC CATHETERIZATION N/A 12/09/2015  Procedure: Right Heart Cath;  Surgeon: Adrian Prows, MD;  Location: [facility];  Service: Cardiovascular;   Laterality: N/A;  CHOLECYSTECTOMY  2000  HERNIA REPAIR  2007  rupture disk  1970"s  SHOULDER ARTHROSCOPY WITH ROTATOR CUFF REPAIR  1999  rt shoulder HPI: Patient is a 89 year old female history of hypertension, hyperlipidemia, chronic respiratory failure/COPD on 4 L nasal cannula, diastolic CHF, recently diagnosed bilateral lower extremity DVT, recent history of COVID, went to follow-up with PCP on day of admission, noted to have been unable to get her Eliquis for the past 6 days since discharge as pharmacy did not have it available, patient started on Xarelto per PCP.  Patient seen at PCPs office noted to have hyperkalemia given [facility], also noted to be hyperglycemic, noted to have some confusion and subsequently presented to the ED.  Patient seen in the ED placed on Endo tool/insulin drip.  Subjective: pleasant, has been coughing all night and today as well  Recommendations for follow up therapy are one component of a multi-disciplinary discharge planning process, led by the attending physician.  Recommendations may be updated based on patient status, additional functional criteria and insurance authorization. Assessment / Plan / Recommendation   05/18/2022   3:40 PM Clinical Impressions Clinical Impression Patient presents with a mild pharyngeal phase dysphagia but with oral phase WFL. SLP used the following barium consistencies: thin, nectar thick, puree, 47m barium tablet. During pharyngeal phase, patient exhibited decreased hyolaryngeal movement leading to trace to mild vallecular and  pyriform sinus residuals with thin liquids and mild vallecular sinus residuals, trace pyriform residuals with nectar thick liquid. No significant amount of PO residuals observed with puree solids taken with 46m barium tablet. Patient exhibited trace penetration of thin liquid barium into laryngeal vestibule, however airway appeared well protected. Penetration was sensed by patient, who exhibited a throat clear and cough.  Slightly increased amount of vallecular residuals observed with nectar thick liquid, but no penetration. With thin and nectar thick liquids, patient able to clear majority of barium residuals with 1-2 dry swallows and during penetration events, she was able to clear laryngeal vestibule with cued cough and reswallow. No aspiration observed with any of the tested barium consistencies.  As patient continues with significant amount of coughing, with some of this coughing likely due to sensed penetration, SLP recommending to continue with nectar thick liquids at this time but SLP will plan for trials of upgraded liquids (thin consistency with anticipation of good progress overall. SLP Visit Diagnosis Dysphagia, pharyngeal phase (R13.13);Dysphagia, pharyngoesophageal phase (R13.14) Impact on safety and function Mild aspiration risk     05/18/2022   3:40 PM Treatment Recommendations Treatment Recommendations Therapy as outlined in treatment plan below     05/18/2022   3:44 PM Prognosis Prognosis for Safe Diet Advancement Good   05/18/2022   3:40 PM Diet Recommendations SLP Diet Recommendations Dysphagia 3 (Mech soft) solids;Nectar thick liquid Liquid Administration via Cup;Straw Medication Administration Whole meds with puree Compensations Slow rate;Small sips/bites Postural Changes Seated upright at 90 degrees     05/18/2022   3:40 PM Other Recommendations Oral Care Recommendations Oral care BID Follow Up Recommendations Follow physician's recommendations for discharge plan and follow up therapies Functional Status Assessment Patient has had a recent decline in their functional status and demonstrates the ability to make significant improvements in function in a reasonable and predictable amount of time.   05/18/2022   3:40 PM Frequency and Duration  Speech Therapy Frequency (ACUTE ONLY) min 2x/week Treatment Duration 1 week     05/18/2022   3:38 PM Oral Phase [facility]   05/18/2022   3:38 PM Pharyngeal Phase Pharyngeal  Phase Impaired Pharyngeal- Thin Straw Reduced anterior laryngeal mobility;Penetration/Aspiration during swallow;Pharyngeal residue - valleculae;Pharyngeal residue - pyriform Pharyngeal Material enters airway, remains ABOVE vocal cords then ejected out;Material does not enter airway Pharyngeal- Puree Reduced pharyngeal peristalsis [facility]aryngeal- [facility]   05/18/2022   3:39 PM Cervical Esophageal Phase  Cervical Esophageal Phase Impaired Cervical Esophageal Comment appearance of cricopharyngeal bar which did not appear to significantly impact barium transist at level of cervical esophagus JSonia Baller MA, CCC-SLP Speech Therapy                       Medical Consultants:   None.   Subjective:    MBRIELL PAULETTErelates she feels better.  Objective:    Vitals:   05/19/22 0932 05/19/22 1218 05/19/22 2100 05/20/22 0500  BP:  (!) 123/57 (!) 132/56 (!) 138/55  Pulse:  (!) 103 94 74  Resp:  18 20 (!) 22  Temp:  (!) 97.5 F (36.4 C) (!) 97.5 F (36.4 C) 97.6 F (36.4 C)  TempSrc:  Oral Oral Oral  SpO2: 95% 98% 99% 100%  Weight:      Height:       SpO2: 100 % O2 Flow Rate (L/min): 3 L/min FiO2 (%): 32 %   Intake/Output Summary (Last 24 hours) at 05/20/2022 1200 Last  data filed at 05/20/2022 0830 Gross per 24 hour  Intake 2251.22 ml  Output 501 ml  Net 1750.22 ml   Filed Weights   05/18/22 0100  Weight: 87.4 kg    Exam: General exam: In no acute distress. Respiratory system: Good air movement and clear to auscultation. Cardiovascular system: S1 & S2 heard, RRR. No JVD. Gastrointestinal system: Abdomen is nondistended, soft and nontender.  Extremities: No pedal edema. Skin: No rashes, lesions or ulcers Psychiatry: Judgement and insight appear normal. Mood & affect appropriate.    Data Reviewed:    Labs: Basic Metabolic Panel: Recent Labs  Lab 05/13/22 2344 05/14/22 0254 05/14/22 0300 05/14/22 0700 05/15/22 0352 05/16/22 0549 05/17/22 0718 05/18/22 0545  05/19/22 0526 05/20/22 0522  NA  --   --    < > 135 131* 132* 134* 133* 135 137  K  --   --    < > 3.9 4.2 3.8 4.0 4.8 4.0 4.2  CL  --   --    < > 104 95* 98 99 100 104 105  CO2  --   --    < > '25 26 28 29 27 26 25  '$ GLUCOSE  --   --    < > 113* 246* 214* 153* 185* 161* 205*  BUN  --   --    < > 51* 34* 27* 20 37* 30* 23  CREATININE  --   --    < > 0.94 0.91 0.86 0.87 1.28* 1.09* 0.99  CALCIUM  --   --    < > 8.2* 8.6* 8.5* 8.6* 8.4* 8.1* 7.9*  MG 1.5* 1.6*  --  1.6* 2.0  --  1.9 1.9  --   --   PHOS 2.5 2.7  --   --  2.5  --   --   --   --   --    < > = values in this interval not displayed.   GFR Estimated Creatinine Clearance: 41.2 mL/min (by C-G formula based on SCr of 0.99 mg/dL). Liver Function Tests: Recent Labs  Lab 05/13/22 2111 05/15/22 0352  AST 21  --   ALT 24  --   ALKPHOS 150*  --   BILITOT 1.4*  --   PROT 6.8  --   ALBUMIN 3.7 3.0*   No results for input(s): "LIPASE", "AMYLASE" in the last 168 hours. Recent Labs  Lab 05/13/22 2349  AMMONIA 16   Coagulation profile No results for input(s): "INR", "PROTIME" in the last 168 hours. COVID-19 Labs  No results for input(s): "DDIMER", "FERRITIN", "LDH", "CRP" in the last 72 hours.  Lab Results  Component Value Date   SARSCOV2NAA POSITIVE (A) 05/03/2022   [place] NEGATIVE 12/06/2019   [place] NEGATIVE 02/26/2019    CBC: Recent Labs  Lab 05/15/22 0352 05/16/22 0549 05/17/22 0718 05/18/22 0545 05/19/22 0526 05/20/22 0522  WBC 19.6* 16.1* 14.7* 11.0* 8.0 7.9  NEUTROABS 17.7* 14.2* 12.2*  --  6.0 6.2  HGB 10.4* 10.9* 12.0 10.0* 9.1* 9.0*  HCT 31.9* 34.3* 38.5 31.8* 28.6* 29.1*  MCV 88.9 90.3 92.8 92.4 92.3 95.4  PLT PLATELET CLUMPS NOTED ON SMEAR, UNABLE TO ESTIMATE PLATELET CLUMPS NOTED ON SMEAR, UNABLE TO ESTIMATE PLATELET CLUMPS NOTED ON SMEAR, UNABLE TO ESTIMATE PLATELET CLUMPS NOTED ON SMEAR, UNABLE TO ESTIMATE 62* PLATELET CLUMPS NOTED ON SMEAR, UNABLE TO ESTIMATE   Cardiac  Enzymes: Recent Labs  Lab 05/13/22 2344  CKTOTAL 49   BNP (last 3 results) No  results for input(s): "PROBNP" in the last 8760 hours. CBG: Recent Labs  Lab 05/19/22 1143 05/19/22 1656 05/19/22 2056 05/20/22 0725 05/20/22 1125  GLUCAP 151* 99 199* 194* 152*   D-Dimer: No results for input(s): "DDIMER" in the last 72 hours. Hgb A1c: No results for input(s): "HGBA1C" in the last 72 hours. Lipid Profile: No results for input(s): "CHOL", "HDL", "LDLCALC", "TRIG", "CHOLHDL", "LDLDIRECT" in the last 72 hours. Thyroid function studies: No results for input(s): "TSH", "T4TOTAL", "T3FREE", "THYROIDAB" in the last 72 hours.  Invalid input(s): "FREET3" Anemia work up: No results for input(s): "VITAMINB12", "FOLATE", "FERRITIN", "TIBC", "IRON", "RETICCTPCT" in the last 72 hours. Sepsis Labs: Recent Labs  Lab 05/13/22 2350 05/14/22 0131 05/14/22 0330 05/14/22 0523 05/14/22 0830 05/17/22 0718 05/18/22 0545 05/19/22 0526 05/20/22 0522  WBC  --   --   --   --    < > 14.7* 11.0* 8.0 7.9  LATICACIDVEN 5.2* 3.6* 2.4* 1.6  --   --   --   --   --    < > = values in this interval not displayed.   Microbiology Recent Results (from the past 240 hour(s))  Urine Culture     Status: Abnormal   Collection Time: 05/13/22  9:33 PM   Specimen: In/Out Cath Urine  Result Value Ref Range Status   Specimen Description   Final    IN/OUT CATH URINE Performed at [facility], [place] [address] [place], [place] [address]    Special Requests   Final    NONE Performed at [facility], [place] [address], [place], [place] [address]    Culture >=100,000 COLONIES/mL ESCHERICHIA COLI (A)  Final   Report Status 05/17/2022 FINAL  Final   Organism ID, Bacteria ESCHERICHIA COLI (A)  Final      Susceptibility   Escherichia coli - MIC*    AMPICILLIN >=32 RESISTANT Resistant     CEFAZOLIN >=64 RESISTANT Resistant     CEFEPIME <=0.12 SENSITIVE Sensitive     CEFTRIAXONE  <=0.25 SENSITIVE Sensitive     CIPROFLOXACIN <=0.25 SENSITIVE Sensitive     GENTAMICIN <=1 SENSITIVE Sensitive     IMIPENEM <=0.25 SENSITIVE Sensitive     NITROFURANTOIN <=16 SENSITIVE Sensitive     TRIMETH/SULFA <=20 SENSITIVE Sensitive     AMPICILLIN/SULBACTAM >=32 RESISTANT Resistant     PIP/TAZO 8 SENSITIVE Sensitive     * >=100,000 COLONIES/mL ESCHERICHIA COLI     Medications:    fluticasone  2 spray Each Nare Daily   guaiFENesin  1,200 mg Oral BID   hydrocortisone  25 mg Rectal BID   insulin aspart  0-9 Units Subcutaneous TID WC   insulin glargine-yfgn  20 Units Subcutaneous Daily   loratadine  10 mg Oral Daily   Netarsudil-Latanoprost  1 drop Both Eyes QHS   pantoprazole  40 mg Oral Daily   rivaroxaban  15 mg Oral BID   Followed by   Derrill Memo ON 05/23/2022] rivaroxaban  20 mg Oral Q supper   valACYclovir  1,000 mg Per Tube TID   zinc sulfate  220 mg Oral Daily   Continuous Infusions:  sodium chloride 100 mL/hr at 05/20/22 0203   ampicillin-sulbactam (UNASYN) IV 3 g (05/20/22 0605)   sodium chloride        LOS: 6 days   Charlynne Cousins  [facility]  05/20/2022, 12:00 PM

## 2022-05-20 NOTE — [place] FL2 (Signed)
[place] MEDICAID FL2 LEVEL OF CARE FORM     IDENTIFICATION  Patient Name: [name] Birthdate: 12/18/1930 Sex: female Admission Date (Current Location): 05/13/2022  [facility] and [place] Number:  Herbalist and Address:  [facility],  [place], [place]      Provider Number: [id number]  Attending Physician Name and Address:  Charlynne Cousins, MD  Relative Name and Phone Number:       Current Level of Care: Hospital Recommended Level of Care: [place] Prior Approval Number:    Date Approved/Denied:   PASRR Number: [id number]  Discharge Plan: SNF    Current Diagnoses: Patient Active Problem List   Diagnosis Date Noted   Rectal bleeding 05/17/2022   UTI (urinary tract infection) 05/16/2022   Pressure injury of skin 05/16/2022   Acute bronchitis 05/15/2022   Elevated lactic acid level 05/14/2022   Hyperkalemia 05/14/2022   Hyperosmolar hyperglycemic state (HHS) ([place]) 05/13/2022   History of DVT (deep vein thrombosis) 05/13/2022   COVID-19 virus infection 05/04/2022   COPD exacerbation ([place]) 05/04/2022   AKI (acute kidney injury) ([place]) 05/03/2022   Hyponatremia 05/03/2022   Elevated LFTs 05/03/2022   Chronic hypoxic respiratory failure ([place]) 05/03/2022   Anxiety 12/18/2020   Arthritis 12/18/2020   Aortic valve disorder 12/18/2020   Chronic obstructive pulmonary disease, unspecified ([place]) 12/18/2020   Diabetic neuropathy ([place]) 12/18/2020   Diabetic renal disease ([place]) 12/18/2020   Heart failure ([place]) 12/18/2020   Hyperlipidemia 12/18/2020   Mitral regurgitation 12/18/2020   Morbid obesity ([place]) 12/18/2020   Obstructive sleep apnea syndrome 12/18/2020   Vitamin B12 deficiency (non anemic) 12/18/2020   Other long term (current) drug therapy 12/18/2020   CAP (community acquired pneumonia) 12/07/2019   Cellulitis 02/26/2019   Essential hypertension 11/03/2017   Sepsis ([place]) 11/03/2017   Type II  diabetes mellitus with renal manifestations ([place]) 11/03/2017   Cellulitis of right leg 11/03/2017   Flank pain 11/03/2017   Chronic diastolic CHF (congestive heart failure) ([place]) 03/19/2017   Diabetes mellitus ([place]) 03/19/2017   Obesity, diabetes, and hypertension syndrome ([place]) 03/19/2017   CKD (chronic kidney disease) stage 3, GFR 30-59 ml/min (HCC) 03/19/2017   Syncope 10/24/2016   Pulmonary hypertension ([place]) 12/07/2015   Varicose veins of leg with complications 56/43/3295   Varicose veins of left lower extremity with complications 18/84/1660   Varicose veins of lower extremities with other complications 63/04/6008   Lipoma of back 07/19/2012    Orientation RESPIRATION BLADDER Height & Weight     Self, Time, Place, Situation  Normal Continent Weight: 87.4 kg Height:  '5\' 4"'$  (162.6 cm)  BEHAVIORAL SYMPTOMS/MOOD NEUROLOGICAL BOWEL NUTRITION STATUS      Continent Diet (regular)  AMBULATORY STATUS COMMUNICATION OF NEEDS Skin   Extensive Assist Verbally Normal                       Personal Care Assistance Level of Assistance  Bathing, Feeding, Dressing Bathing Assistance: Limited assistance Feeding assistance: Limited assistance Dressing Assistance: Limited assistance     Functional Limitations Info  Sight, Hearing, Speech Sight Info: Adequate Hearing Info: Adequate Speech Info: Adequate    SPECIAL CARE FACTORS FREQUENCY  PT (By licensed PT), OT (By licensed OT)     PT Frequency: 5 x weekly OT Frequency: 5 x weeklky            Contractures Contractures Info: Not present    Additional Factors Info  Code  Status Code Status Info: FULL             Current Medications (05/20/2022):  This is the current hospital active medication list Current Facility-Administered Medications  Medication Dose Route Frequency Provider Last Rate Last Admin   0.9 %  sodium chloride infusion   Intravenous Continuous Eugenie Filler, MD 100 mL/hr at 05/20/22 0203 New Bag at  05/20/22 0203   Ampicillin-Sulbactam (UNASYN) 3 g in sodium chloride 0.9 % 100 mL IVPB  3 g Intravenous Q8H Charlynne Cousins, MD 200 mL/hr at 05/20/22 1308 3 g at 05/20/22 1308   dextrose 50 % solution 0-50 mL  0-50 mL Intravenous PRN [name], MD       fluticasone (FLONASE) 50 MCG/ACT nasal spray 2 spray  2 spray Each Nare Daily Eugenie Filler, MD   2 spray at 05/20/22 0823   guaiFENesin (MUCINEX) 12 hr tablet 1,200 mg  1,200 mg Oral BID Eugenie Filler, MD   1,200 mg at 05/20/22 9030   guaiFENesin-dextromethorphan (ROBITUSSIN DM) 100-10 MG/5ML syrup 10 mL  10 mL Oral Q4H PRN Raenette Rover, NP   10 mL at 05/19/22 0911   hydrocortisone (ANUSOL-HC) suppository 25 mg  25 mg Rectal BID Eugenie Filler, MD   25 mg at 05/20/22 0923   insulin aspart (novoLOG) injection 0-9 Units  0-9 Units Subcutaneous TID WC Eugenie Filler, MD   2 Units at 05/20/22 1201   insulin glargine-yfgn (SEMGLEE) injection 20 Units  20 Units Subcutaneous Daily Eugenie Filler, MD   20 Units at 05/20/22 3007   ipratropium-albuterol (DUONEB) 0.5-2.5 (3) MG/3ML nebulizer solution 3 mL  3 mL Nebulization Q4H PRN Eugenie Filler, MD   3 mL at 05/19/22 0932   loratadine (CLARITIN) tablet 10 mg  10 mg Oral Daily Eugenie Filler, MD   10 mg at 05/20/22 6226   menthol-cetylpyridinium (CEPACOL) lozenge 3 mg  1 lozenge Oral PRN Eugenie Filler, MD   3 mg at 05/18/22 1112   Netarsudil-Latanoprost 0.02-0.005 % SOLN 1 drop  1 drop Both Eyes QHS Eugenie Filler, MD   1 drop at 05/19/22 2117   pantoprazole (PROTONIX) EC tablet 40 mg  40 mg Oral Daily Doutova, Nyoka Lint, MD   40 mg at 05/20/22 0822   phenol (CHLORASEPTIC) mouth spray 1 spray  1 spray Mouth/Throat PRN Eugenie Filler, MD   1 spray at 05/18/22 0359   Rivaroxaban (XARELTO) tablet 15 mg  15 mg Oral BID Thomes Lolling, RPH   15 mg at 05/20/22 3335   Followed by   Derrill Memo ON 05/23/2022] rivaroxaban (XARELTO) tablet 20 mg  20 mg Oral Q  supper Thomes Lolling, RPH       sodium chloride 0.9 % bolus 500 mL  500 mL Intravenous Once Raenette Rover, NP       valACYclovir (VALTREX) tablet 1,000 mg  1,000 mg Per Tube TID Eugenie Filler, MD   1,000 mg at 05/20/22 4562   zinc sulfate capsule 220 mg  220 mg Oral Daily Eugenie Filler, MD   220 mg at 05/20/22 5638     Discharge Medications: Please see discharge summary for a list of discharge medications.  Relevant Imaging Results:  Relevant Lab Results:   Additional Information LHT:[id number]  Leeroy Cha, RN

## 2022-05-21 DIAGNOSIS — E1100 Type 2 diabetes mellitus with hyperosmolarity without nonketotic hyperglycemic-hyperosmolar coma (NKHHC): Secondary | ICD-10-CM | POA: Diagnosis not present

## 2022-05-21 DIAGNOSIS — J9611 Chronic respiratory failure with hypoxia: Secondary | ICD-10-CM | POA: Diagnosis not present

## 2022-05-21 DIAGNOSIS — I5032 Chronic diastolic (congestive) heart failure: Secondary | ICD-10-CM | POA: Diagnosis not present

## 2022-05-21 DIAGNOSIS — N179 Acute kidney failure, unspecified: Secondary | ICD-10-CM | POA: Diagnosis not present

## 2022-05-21 LAB — GLUCOSE, CAPILLARY
Glucose-Capillary: 100 mg/dL — ABNORMAL HIGH (ref 70–99)
Glucose-Capillary: 155 mg/dL — ABNORMAL HIGH (ref 70–99)
Glucose-Capillary: 150 mg/dL — ABNORMAL HIGH (ref 70–99)
Glucose-Capillary: 100 mg/dL — ABNORMAL HIGH (ref 70–99)

## 2022-05-21 MED ORDER — AMOXICILLIN-POT CLAVULANATE 875-125 MG PO TABS: 1.0000 | ORAL_TABLET | Freq: Two times a day (BID) | ORAL | 0 refills | Status: AC

## 2022-05-21 MED ORDER — RIVAROXABAN 20 MG PO TABS: 20.0000 mg | ORAL_TABLET | Freq: Every day | ORAL | Status: DC

## 2022-05-21 MED ORDER — GLIPIZIDE 5 MG PO TABS: 5.0000 mg | ORAL_TABLET | Freq: Two times a day (BID) | ORAL | 11 refills | Status: DC

## 2022-05-21 MED ORDER — VALACYCLOVIR HCL 1 G PO TABS: 1000.0000 mg | ORAL_TABLET | Freq: Three times a day (TID) | ORAL | 0 refills | Status: AC

## 2022-05-21 MED ORDER — RIVAROXABAN 15 MG PO TABS: 15.0000 mg | ORAL_TABLET | Freq: Two times a day (BID) | ORAL | Status: DC

## 2022-05-21 MED ORDER — RIVAROXABAN 15 MG PO TABS
15.0000 mg | ORAL_TABLET | Freq: Two times a day (BID) | ORAL | Status: DC
  Administered 2022-05-21 – 2022-05-22 (×2): 15 mg via ORAL
  Filled 2022-05-21 (×2): qty 1

## 2022-05-21 NOTE — Progress Notes (Signed)
PT refused CPAP.  

## 2022-05-21 NOTE — TOC Progression Note (Addendum)
Transition of Care [facility]) - Progression Note    Patient Details  Name: [name] MRN: [id number] Date of Birth: 19-Jun-1930  Transition of Care [facility]) CM/SW Contact  Dasia Guerrier, Juliann Pulse, RN Phone Number: 05/21/2022, 10:57 AM  Clinical Narrative: Damaris Schooner to dtr Scottie-provided w/bed choices-awaiting choice for ST SNF-list in rm.  -12p-dtr chose [facility] awaiting rep Janie to inform if can accept today.  -1:26p-[facility] no beds for a few days;dtr scottie agreed to [facility] will have bed available tomorrow.    Expected Discharge Plan: [place] Barriers to Discharge: SNF Pending bed offer  Expected Discharge Plan and Services         Expected Discharge Date: 05/21/22                         HH Arranged: RN, PT, OT, Social Work, Nurse's Aide [place] Agency: [place] Date [place]: 05/16/22   Representative spoke with at [place]: Cindie   Social Determinants of Health ([place]) Interventions SDOH Screenings   Food Insecurity: No Food Insecurity (05/16/2022)  Housing: Low Risk  (05/03/2022)  Transportation Needs: No Transportation Needs (05/16/2022)  Utilities: Not At Risk (05/16/2022)  Tobacco Use: Low Risk  (05/13/2022)    Readmission Risk Interventions    05/07/2022    3:17 PM  Readmission Risk Prevention Plan  Transportation Screening Complete  PCP or Specialist Appt within 3-5 Days Complete  HRI or [place] Complete  Social Work Consult for [place] Planning/Counseling Complete  Palliative Care Screening Not Applicable  Medication Review Press photographer) Complete

## 2022-05-21 NOTE — Progress Notes (Signed)
[place] for rivaroxaban Indication:  bilateral DVT  Allergies  Allergen Reactions   Cephalexin Anaphylaxis, Swelling and Other (See Comments)   Micardis [Telmisartan] Other (See Comments)    Hyperkalemia during hospitalization with pulmonary edema   Atorvastatin Other (See Comments)    Other reaction(s): myalgia   Buspirone Other (See Comments)    Other reaction(s): "couldn't breath"   Canagliflozin Other (See Comments)    Pt does not recall   Doxycycline Hyclate Other (See Comments)    Other reaction(s): nausea   Lipitor [Atorvastatin Calcium] Other (See Comments)    myalgia   Tramadol Nausea Only   Tramadol Hcl Other (See Comments)    Patient Measurements: Height: '5\' 4"'$  (162.6 cm) Weight: 87.4 kg (192 lb 10.9 oz) IBW/kg (Calculated) : 54.7  Vital Signs: Temp: 97.5 F (36.4 C) (01/25 0430) Temp Source: Oral (01/25 0430) BP: 128/63 (01/25 0430) Pulse Rate: 103 (01/25 0430)  Labs: Recent Labs    05/19/22 0526 05/20/22 0522  HGB 9.1* 9.0*  HCT 28.6* 29.1*  PLT 62* PLATELET CLUMPS NOTED ON SMEAR, UNABLE TO ESTIMATE  CREATININE 1.09* 0.99    Estimated Creatinine Clearance: 41.2 mL/min (by C-G formula based on SCr of 0.99 mg/dL).   Medical History: Past Medical History:  Diagnosis Date   Cellulitis and abscess of left leg    CHF (congestive heart failure) (HCC)    COPD (chronic obstructive pulmonary disease) (HCC)    Diabetes mellitus    Glaucoma    Hyperlipidemia    Hypertension     Medications: Was prescribed apixaban PTA, but pt was not taking medication as it was not available at pharmacy after previous hospital discharge on 05/07/22.   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 Pt received two doses of therapeutic LMWH prior to be transitioned to apixaban for VTE treatment. Pt was discharged with prescription for apixaban on 05/07/22, but  pharmacy did not have it in stock. Pt was without anticoagulation outpatient until 1/17 when she was given a Xarelto starter pack.   Pt re-admitted to hospital on 05/13/22. Pharmacy consulted to dose rivaroxaban for DVT.   Today, 05/21/22 CBC: Hgb low during admission, Plt low as well (62 on 1/23) SCr 0.99, CrCl ~41 mL/min  Plan:  Rivaroxaban 15 mg PO BID x 21 days followed by rivaroxaban 20 mg PO daily starting on 06/03/22. Monitor CBC, renal function. Discharging to SNF  Lenis Noon, PharmD 05/21/2022,10:31 AM

## 2022-05-21 NOTE — Progress Notes (Signed)
PT refused cpap states she does not want it tonight.

## 2022-05-21 NOTE — Discharge Summary (Addendum)
Physician Discharge Summary  [name] DGU:[id number] DOB: [birth year] DOA: 05/13/2022  PCP: [name], DO  Admit date: 05/13/2022 Discharge date: 05/21/2022  Admitted From: Home Disposition:  SNF  Recommendations for Outpatient Follow-up:  Follow up with PCP in 1-2 weeks Please obtain BMP/CBC in one week   Home Health:No Equipment/Devices:None  Discharge Condition:Stable CODE STATUS:Full Diet recommendation: Heart Healthy   Brief/Interim Summary: 89 y.o. female past medical history of essential hypertension, chronic respiratory failure with hypoxia/COPD on 4 L of oxygen, chronic diastolic heart failure recently diagnosed bilateral lower extremity DVT (PCP on the day of admission was unable to refill his Eliquis for days after discharge, started on Xarelto by his PCP in the PCPs office was noted to be hyperkalemic given Lokelma, was also found to be hyperglycemic and confused subsequently sent to the ED    Discharge Diagnoses:  Principal Problem:   Hyperosmolar hyperglycemic state (HHS) ([place]) Active Problems:   AKI (acute kidney injury) ([place])   Chronic diastolic CHF (congestive heart failure) ([place])   Diabetes mellitus ([place])   Essential hypertension   Chronic obstructive pulmonary disease, unspecified ([place])   Obstructive sleep apnea syndrome   Chronic hypoxic respiratory failure ([place])   History of DVT (deep vein thrombosis)   Elevated lactic acid level   Hyperkalemia   Acute bronchitis   UTI (urinary tract infection)   Pressure injury of skin   Rectal bleeding  Hyperosmolar hyperglycemic nonketotic state/diabetes mellitus type 2: Started on an insulin drip her blood glucose corrected her A1c was 10.3. She was transition to long-acting insulin per sliding scale PT evaluated the patient recommended home health PT. She was switched back to her home dose of metformin glipizide was added.  Hyperkalemia: Likely due to hyperosmolar state now resolved.  Acute kidney  injury: Likely prerenal azotemia resolved with IV fluid hydration.  Chronic diastolic heart failure: Appears euvolemic no changes made to her medication.  Recently diagnosed with bilateral DVTs: Was not able to get Eliquis as an outpatient she was switched to oral Xarelto which she will continue as an outpatient.  Elevated lactic acid: Likely due to dehydration.  Essential hypertension: Antihypertensive medications were held on admission they will be resumed as an outpatient.  Chronic respiratory failure with hypoxia on 4 L of oxygen at home: Chest x-ray was done that showed increased left lower lobe infiltrate she was started on IV Unasyn she will continue Augmentin as an outpatient.  Struct of sleep apnea: Noncompliant with her CPAP.  E. coli UTI: Grew more than 100,000 colonies, Augmentin should cover.  Single episode of rectal bleeding: Hemoglobin remained stable likely hemorrhoidal bleed follow-up with PCP as an outpatient.  Discharge Instructions  Discharge Instructions     Diet - low sodium heart healthy   Complete by: As directed    Increase activity slowly   Complete by: As directed    No wound care   Complete by: As directed       Allergies as of 05/21/2022       Reactions   Cephalexin Anaphylaxis, Swelling, Other (See Comments)   Micardis [telmisartan] Other (See Comments)   Hyperkalemia during hospitalization with pulmonary edema   Atorvastatin Other (See Comments)   Other reaction(s): myalgia   Buspirone Other (See Comments)   Other reaction(s): "couldn't breath"   Canagliflozin Other (See Comments)   Pt does not recall   Doxycycline Hyclate Other (See Comments)   Other reaction(s): nausea   Lipitor [atorvastatin Calcium] Other (See Comments)  myalgia   Tramadol Nausea Only   Tramadol Hcl Other (See Comments)        Medication List     STOP taking these medications    ascorbic acid 500 MG tablet Commonly known as: VITAMIN C    fluticasone 50 MCG/ACT nasal spray Commonly known as: FLONASE   ipratropium-albuterol 0.5-2.5 (3) MG/3ML Soln Commonly known as: DUONEB   predniSONE 10 MG (21) Tbpk tablet Commonly known as: STERAPRED UNI-PAK 21 TAB       TAKE these medications    acetaminophen 325 MG tablet Commonly known as: TYLENOL Take 2 tablets (650 mg total) by mouth every 6 (six) hours as needed for mild pain (or Fever >/= 101).   amLODipine 10 MG tablet Commonly known as: NORVASC TAKE 1 TABLET(10 MG) BY MOUTH DAILY What changed: See the new instructions.   amoxicillin-clavulanate 875-125 MG tablet Commonly known as: AUGMENTIN Take 1 tablet by mouth 2 (two) times daily for 3 days.   glipiZIDE 5 MG tablet Commonly known as: GLUCOTROL Take 1 tablet (5 mg total) by mouth 2 (two) times daily.   isosorbide mononitrate 60 MG 24 hr tablet Commonly known as: IMDUR TAKE 1 TABLET DAILY   liraglutide 18 MG/3ML Sopn Commonly known as: VICTOZA Inject 0.6 mg into the skin daily.   loratadine 10 MG tablet Commonly known as: CLARITIN Take 1 tablet (10 mg total) by mouth daily.   metFORMIN 1000 MG tablet Commonly known as: GLUCOPHAGE Take 0.5 tablets (500 mg total) by mouth daily.   pantoprazole 40 MG tablet Commonly known as: PROTONIX Take 1 tablet (40 mg total) by mouth daily.   Rivaroxaban 15 MG Tabs tablet Commonly known as: XARELTO Take 1 tablet (15 mg total) by mouth 2 (two) times daily.   rivaroxaban 20 MG Tabs tablet Commonly known as: XARELTO Take 1 tablet (20 mg total) by mouth daily with supper. Start taking on: June 03, 2022   Rocklatan 0.02-0.005 % Soln Generic drug: Netarsudil-Latanoprost Place 1 drop into both eyes at bedtime.   Simbrinza 1-0.2 % Susp Generic drug: Brinzolamide-Brimonidine Apply 2 drops to eye daily.   valACYclovir 1000 MG tablet Commonly known as: VALTREX Place 1 tablet (1,000 mg total) into feeding tube 3 (three) times daily for 3 days.   zinc  sulfate 220 (50 Zn) MG capsule Take 1 capsule (220 mg total) by mouth daily.        Allergies  Allergen Reactions   Cephalexin Anaphylaxis, Swelling and Other (See Comments)   Micardis [Telmisartan] Other (See Comments)    Hyperkalemia during hospitalization with pulmonary edema   Atorvastatin Other (See Comments)    Other reaction(s): myalgia   Buspirone Other (See Comments)    Other reaction(s): "couldn't breath"   Canagliflozin Other (See Comments)    Pt does not recall   Doxycycline Hyclate Other (See Comments)    Other reaction(s): nausea   Lipitor [Atorvastatin Calcium] Other (See Comments)    myalgia   Tramadol Nausea Only   Tramadol Hcl Other (See Comments)    Consultations: None   Procedures/Studies: DG CHEST PORT 1 VIEW  Result Date: 05/19/2022 CLINICAL DATA:  [address] with cough. History of CHF, COPD and diabetes. EXAM: PORTABLE CHEST 1 VIEW COMPARISON:  Portable chest 05/15/2022, portable chest 05/13/2022, CTA chest 12/06/2019 FINDINGS: 2:59 p.m. There is increased left lower lobe opacity in the retrocardiac area concerning for pneumonia or aspiration. Remaining lungs hyperexpanded and otherwise clear. The sulci are sharp. There is mild cardiomegaly, stable vascular prominence  of the right hilum, stable mediastinum with aortic tortuosity and calcification. There is osteopenia with degenerative changes and mild dextroscoliosis of the thoracic spine. IMPRESSION: 1. Increased left lower lobe retrocardiac opacity concerning for pneumonia or aspiration. 2. COPD. 3. Aortic atherosclerosis. 4. Osteopenia, scoliosis and degenerative changes. Electronically Signed   By: Telford Nab M.D.   On: 05/19/2022 03:55   DG Swallowing Func-Speech Pathology  Result Date: 05/18/2022 Table formatting from the original result was not included. Objective Swallowing Evaluation: Type of Study: MBS-Modified Barium Swallow Study  Patient Details Name: TARREN SABREE MRN: [id number] Date of  Birth: [birth year] Today's Date: 05/18/2022 Time: SLP Start Time (ACUTE ONLY): 71 -SLP Stop Time (ACUTE ONLY): 5916 SLP Time Calculation (min) (ACUTE ONLY): 20 min Past Medical History: Past Medical History: Diagnosis Date  Cellulitis and abscess of left leg   CHF (congestive heart failure) ([facility])   COPD (chronic obstructive pulmonary disease) ([place])   Diabetes mellitus   Glaucoma   Hyperlipidemia   Hypertension  Past Surgical History: Past Surgical History: Procedure Laterality Date  BACK SURGERY    CARDIAC CATHETERIZATION N/A 12/09/2015  Procedure: Right Heart Cath;  Surgeon: Adrian Prows, MD;  Location: [facility];  Service: Cardiovascular;  Laterality: N/A;  CHOLECYSTECTOMY  2000  HERNIA REPAIR  2007  rupture disk  1970"s  SHOULDER ARTHROSCOPY WITH ROTATOR CUFF REPAIR  1999  rt shoulder HPI: Patient is a 89 year old female history of hypertension, hyperlipidemia, chronic respiratory failure/COPD on 4 L nasal cannula, diastolic CHF, recently diagnosed bilateral lower extremity DVT, recent history of COVID, went to follow-up with PCP on day of admission, noted to have been unable to get her Eliquis for the past 6 days since discharge as pharmacy did not have it available, patient started on Xarelto per PCP.  Patient seen at PCPs office noted to have hyperkalemia given [facility], also noted to be hyperglycemic, noted to have some confusion and subsequently presented to the ED.  Patient seen in the ED placed on Endo tool/insulin drip.  Subjective: pleasant, has been coughing all night and today as well  Recommendations for follow up therapy are one component of a multi-disciplinary discharge planning process, led by the attending physician.  Recommendations may be updated based on patient status, additional functional criteria and insurance authorization. Assessment / Plan / Recommendation   05/18/2022   3:40 PM Clinical Impressions Clinical Impression Patient presents with a mild pharyngeal phase dysphagia but with  oral phase WFL. SLP used the following barium consistencies: thin, nectar thick, puree, 21m barium tablet. During pharyngeal phase, patient exhibited decreased hyolaryngeal movement leading to trace to mild vallecular and pyriform sinus residuals with thin liquids and mild vallecular sinus residuals, trace pyriform residuals with nectar thick liquid. No significant amount of PO residuals observed with puree solids taken with 134mbarium tablet. Patient exhibited trace penetration of thin liquid barium into laryngeal vestibule, however airway appeared well protected. Penetration was sensed by patient, who exhibited a throat clear and cough. Slightly increased amount of vallecular residuals observed with nectar thick liquid, but no penetration. With thin and nectar thick liquids, patient able to clear majority of barium residuals with 1-2 dry swallows and during penetration events, she was able to clear laryngeal vestibule with cued cough and reswallow. No aspiration observed with any of the tested barium consistencies.  As patient continues with significant amount of coughing, with some of this coughing likely due to sensed penetration, SLP recommending to continue with nectar thick liquids at this  time but SLP will plan for trials of upgraded liquids (thin consistency with anticipation of good progress overall. SLP Visit Diagnosis Dysphagia, pharyngeal phase (R13.13);Dysphagia, pharyngoesophageal phase (R13.14) Impact on safety and function Mild aspiration risk     05/18/2022   3:40 PM Treatment Recommendations Treatment Recommendations Therapy as outlined in treatment plan below     05/18/2022   3:44 PM Prognosis Prognosis for Safe Diet Advancement Good   05/18/2022   3:40 PM Diet Recommendations SLP Diet Recommendations Dysphagia 3 (Mech soft) solids;Nectar thick liquid Liquid Administration via Cup;Straw Medication Administration Whole meds with puree Compensations Slow rate;Small sips/bites Postural Changes Seated  upright at 90 degrees     05/18/2022   3:40 PM Other Recommendations Oral Care Recommendations Oral care BID Follow Up Recommendations Follow physician's recommendations for discharge plan and follow up therapies Functional Status Assessment Patient has had a recent decline in their functional status and demonstrates the ability to make significant improvements in function in a reasonable and predictable amount of time.   05/18/2022   3:40 PM Frequency and Duration  Speech Therapy Frequency (ACUTE ONLY) min 2x/week Treatment Duration 1 week     05/18/2022   3:38 PM Oral Phase Oral Phase [facility]    05/18/2022   3:38 PM Pharyngeal Phase Pharyngeal Phase Impaired Pharyngeal- Thin Straw Reduced anterior laryngeal mobility;Penetration/Aspiration during swallow;Pharyngeal residue - valleculae;Pharyngeal residue - pyriform Pharyngeal Material enters airway, remains ABOVE vocal cords then ejected out;Material does not enter airway Pharyngeal- Puree Reduced pharyngeal peristalsis Pharyngeal- Pill [facility]    05/18/2022   3:39 PM Cervical Esophageal Phase  Cervical Esophageal Phase Impaired Cervical Esophageal Comment appearance of cricopharyngeal bar which did not appear to significantly impact barium transist at level of cervical esophagus Sonia Baller, MA, CCC-SLP Speech Therapy                     DG CHEST PORT 1 VIEW  Result Date: 05/15/2022 CLINICAL DATA:  Cough EXAM: PORTABLE CHEST 1 VIEW COMPARISON:  Chest x-ray dated May 13, 2022 FINDINGS: Cardiac and mediastinal contours are unchanged. Mild bibasilar opacities, likely atelectasis. No large pleural effusion no evidence of pneumothorax. IMPRESSION: Mild bibasilar opacities, likely atelectasis. Electronically Signed   By: Yetta Glassman M.D.   On: 05/15/2022 08:12   CT Head Wo Contrast  Result Date: 05/13/2022 CLINICAL DATA:  Hyperkalemia, diabetes, mental status change EXAM: CT HEAD WITHOUT CONTRAST TECHNIQUE: Contiguous axial images were obtained from the base of  the skull through the vertex without intravenous contrast. RADIATION DOSE REDUCTION: This exam was performed according to the departmental dose-optimization program which includes automated exposure control, adjustment of the mA and/or kV according to patient size and/or use of iterative reconstruction technique. COMPARISON:  10/12/2013 FINDINGS: Brain: No acute infarct or hemorrhage. Hypodensities in the periventricular white matter consistent with chronic small vessel ischemic change. The lateral ventricles and remaining midline structures are unremarkable. No acute extra-axial fluid collections. No mass effect. Vascular: Atherosclerosis of the internal carotid arteries. No hyperdense vessel. Skull: Normal. Negative for fracture or focal lesion. Sinuses/Orbits: Mucous retention cyst or polypoid mucosal thickening right posterior ethmoid air cell, stable. The remaining paranasal sinuses are clear. Other: None. IMPRESSION: 1. No acute intracranial process. Electronically Signed   By: Randa Ngo M.D.   On: 05/13/2022 21:31   DG Chest Portable 1 View  Result Date: 05/13/2022 CLINICAL DATA:  Altered mental status, weakness, shortness of breath, COVID. EXAM: PORTABLE CHEST 1 VIEW COMPARISON:  05/07/2022. FINDINGS: Heart is  enlarged and the mediastinal contour stable. There is atherosclerotic calcification of the aorta. Lung volumes are low and there is mild atelectasis is present at the left lung base. No consolidation, effusion, or pneumothorax. Evidence of prior rotator cuff repair is noted on the right. Degenerative changes in the thoracic spine. No acute osseous abnormality. IMPRESSION: 1. Mild atelectasis at the left lung base. 2. Cardiomegaly. Electronically Signed   By: Brett Fairy M.D.   On: 05/13/2022 20:39   DG CHEST PORT 1 VIEW  Result Date: 05/07/2022 CLINICAL DATA:  Shortness of breath.  COVID infection. EXAM: PORTABLE CHEST 1 VIEW COMPARISON:  05/03/2022 FINDINGS: Right greater than left  hilar prominence shown on prior CT exam to be primarily vascular in nature. Atherosclerotic calcification of the aortic arch. Heart size within normal limits for AP projection. Subsegmental atelectasis or scarring at the left lung base. The lungs appear otherwise clear. IMPRESSION: 1. Subsegmental atelectasis or scarring at the left lung base. 2. Right greater than left hilar prominence shown on prior CT exam to be primarily vascular in nature. 3.  Aortic Atherosclerosis (ICD10-I70.0). Electronically Signed   By: Van Clines M.D.   On: 05/07/2022 07:35   VAS [place] LOWER EXTREMITY VENOUS (DVT)  Result Date: 05/04/2022  Lower Venous DVT Study Patient Name:  MANIAH NADING  Date of Exam:   05/04/2022 Medical Rec #: [id number]        Accession #:    [id number] Date of Birth: 03/01/1931        Patient Gender: F Patient Age:   77 years Exam Location[facility]  [facility] Procedure:      VAS [place] LOWER EXTREMITY VENOUS (DVT) Referring Phys: Quillian Quince THOMPSON --------------------------------------------------------------------------------  Indications: Elevated Ddimer.  Risk Factors: COVID 19 positive. Comparison Study: No prior studies. Performing Technologist: Oliver Hum RVT  Examination Guidelines: A complete evaluation includes B-mode imaging, spectral Doppler, color Doppler, and power Doppler as needed of all accessible portions of each vessel. Bilateral testing is considered an integral part of a complete examination. Limited examinations for reoccurring indications may be performed as noted. The reflux portion of the exam is performed with the patient in reverse Trendelenburg.  +---------+---------------+---------+-----------+----------+--------------+ RIGHT    CompressibilityPhasicitySpontaneityPropertiesThrombus Aging +---------+---------------+---------+-----------+----------+--------------+ CFV      Full           Yes      Yes                                  +---------+---------------+---------+-----------+----------+--------------+ SFJ      Full                                                        +---------+---------------+---------+-----------+----------+--------------+ FV Prox  Full                                                        +---------+---------------+---------+-----------+----------+--------------+ FV Mid   Full                                                        +---------+---------------+---------+-----------+----------+--------------+  FV DistalFull                                                        +---------+---------------+---------+-----------+----------+--------------+ PFV      Full                                                        +---------+---------------+---------+-----------+----------+--------------+ POP      Partial        Yes      Yes                  Acute          +---------+---------------+---------+-----------+----------+--------------+ PTV      Partial                                      Acute          +---------+---------------+---------+-----------+----------+--------------+ PERO     Partial                                      Acute          +---------+---------------+---------+-----------+----------+--------------+ Gastroc  Full                                                        +---------+---------------+---------+-----------+----------+--------------+   +---------+---------------+---------+-----------+----------+--------------+ LEFT     CompressibilityPhasicitySpontaneityPropertiesThrombus Aging +---------+---------------+---------+-----------+----------+--------------+ CFV      Full           Yes      Yes                                 +---------+---------------+---------+-----------+----------+--------------+ SFJ      Full                                                         +---------+---------------+---------+-----------+----------+--------------+ FV Prox  Full                                                        +---------+---------------+---------+-----------+----------+--------------+ FV Mid   Full                                                        +---------+---------------+---------+-----------+----------+--------------+ FV DistalFull                                                        +---------+---------------+---------+-----------+----------+--------------+  PFV      Full                                                        +---------+---------------+---------+-----------+----------+--------------+ POP      Full           Yes      Yes                                 +---------+---------------+---------+-----------+----------+--------------+ PTV      None                                         Acute          +---------+---------------+---------+-----------+----------+--------------+ PERO     None                                         Acute          +---------+---------------+---------+-----------+----------+--------------+     Summary: RIGHT: - Findings consistent with acute deep vein thrombosis involving the right popliteal vein, right posterior tibial veins, and right peroneal veins. - No cystic structure found in the popliteal fossa.  LEFT: - Findings consistent with acute deep vein thrombosis involving the left posterior tibial veins, and left peroneal veins. - No cystic structure found in the popliteal fossa.  *See table(s) above for measurements and observations. Electronically signed by Monica Martinez MD on 05/04/2022 at 3:48:50 PM.    Final    US Abdomen Complete  Result Date: 05/03/2022 CLINICAL DATA:  Acute kidney injury, elevated LFTs. EXAM: ABDOMEN ULTRASOUND COMPLETE COMPARISON:  CT abdomen and pelvis 11/14/2010. CT angiogram chest 12/06/2019. FINDINGS: Gallbladder: Gallbladder is surgically absent.  Common bile duct: Diameter: 10 mm, similar to the prior study. There is mild intrahepatic biliary ductal dilatation. Liver: No focal lesion identified. Within normal limits in parenchymal echogenicity. Portal vein is patent on color Doppler imaging with normal direction of blood flow towards the liver. IVC: No abnormality visualized. Pancreas: Limited evaluation secondary to overlying bowel gas and body habitus. Spleen: Normal in size. There is a 3.4 x 2.8 x 2.4 cm cyst in the spleen which has increased in size compared to prior exam. Right Kidney: Length: 9.3 cm. Echogenicity within normal limits. No mass or hydronephrosis visualized. Left Kidney: Length: 9.7 cm. Echogenicity within normal limits. No hydronephrosis. There are 2 cysts identified in the left kidney. One measures 2.0 x 1.2 x 2.2 cm. The other measures 2.1 x 2.3 x 2.6 cm. Abdominal aorta: No aneurysm visualized. Visualized portion measures up to 2.4 cm. Distal abdominal aorta and bifurcation not visualized secondary to overlying bowel gas. Other findings: None. IMPRESSION: 1. No acute abnormality. 2. Status post cholecystectomy. 3. Mild intrahepatic and extrahepatic biliary ductal dilatation, similar to the prior study. 4. Left renal cysts. 5. Splenic cyst which has increased in size compared to prior exam. Electronically Signed   By: Ronney Asters M.D.   On: 05/03/2022 15:27   DG Chest Port 1 View  Result Date: 05/03/2022 CLINICAL DATA:  Shortness of breath with productive cough  for 1 week. Recently diagnosed with COVID. EXAM: PORTABLE CHEST 1 VIEW COMPARISON:  Radiographs 06/16/2021 and 12/08/2020.  CT 12/06/2019. FINDINGS: 0953 hours. The heart size and mediastinal contours are stable with aortic atherosclerosis. There is stable mild atelectasis or scarring at the left lung base. No new airspace disease, edema, significant pleural effusion or pneumothorax. The bones appear unchanged with mild thoracic spondylosis and postsurgical changes at the  right shoulder. No acute osseous findings are demonstrated. Telemetry leads overlie the chest. IMPRESSION: No evidence of acute cardiopulmonary process. Stable mild left basilar atelectasis or scarring. Electronically Signed   By: Richardean Sale M.D.   On: 05/03/2022 10:37   (Echo, Carotid, EGD, Colonoscopy, ERCP)    Subjective: No complaints  Discharge Exam: Vitals:   05/20/22 2107 05/21/22 0430  BP: (!) 129/58 128/63  Pulse: 80 (!) 103  Resp: 20 20  Temp: 97.7 F (36.5 C) (!) 97.5 F (36.4 C)  SpO2: 99% 99%   Vitals:   05/20/22 0500 05/20/22 1309 05/20/22 2107 05/21/22 0430  BP: (!) 138/55 138/60 (!) 129/58 128/63  Pulse: 74 (!) 108 80 (!) 103  Resp: (!) '22 20 20 20  '$ Temp: 97.6 F (36.4 C) 97.8 F (36.6 C) 97.7 F (36.5 C) (!) 97.5 F (36.4 C)  TempSrc: Oral Oral Oral Oral  SpO2: 100% 100% 99% 99%  Weight:      Height:        General: Pt is alert, awake, not in acute distress Cardiovascular: RRR, S1/S2 +, no rubs, no gallops Respiratory: CTA bilaterally, no wheezing, no rhonchi Abdominal: Soft, NT, ND, bowel sounds + Extremities: no edema, no cyanosis    The results of significant diagnostics from this hospitalization (including imaging, microbiology, ancillary and laboratory) are listed below for reference.     Microbiology: Recent Results (from the past 240 hour(s))  Urine Culture     Status: Abnormal   Collection Time: 05/13/22  9:33 PM   Specimen: In/Out Cath Urine  Result Value Ref Range Status   Specimen Description   Final    IN/OUT CATH URINE Performed at [facility] [address] [place], [place] [address]    Special Requests   Final    NONE Performed at [facility], [place] [address], [place], [place] [address]    Culture >=100,000 COLONIES/mL ESCHERICHIA COLI (A)  Final   Report Status 05/17/2022 FINAL  Final   Organism ID, Bacteria ESCHERICHIA COLI (A)  Final      Susceptibility   Escherichia coli -  MIC*    AMPICILLIN >=32 RESISTANT Resistant     CEFAZOLIN >=64 RESISTANT Resistant     CEFEPIME <=0.12 SENSITIVE Sensitive     CEFTRIAXONE <=0.25 SENSITIVE Sensitive     CIPROFLOXACIN <=0.25 SENSITIVE Sensitive     GENTAMICIN <=1 SENSITIVE Sensitive     IMIPENEM <=0.25 SENSITIVE Sensitive     NITROFURANTOIN <=16 SENSITIVE Sensitive     TRIMETH/SULFA <=20 SENSITIVE Sensitive     AMPICILLIN/SULBACTAM >=32 RESISTANT Resistant     PIP/TAZO 8 SENSITIVE Sensitive     * >=100,000 COLONIES/mL ESCHERICHIA COLI     Labs: BNP (last 3 results) Recent Labs    05/03/22 1000  BNP 865.7*   Basic Metabolic Panel: Recent Labs  Lab 05/15/22 0352 05/16/22 0549 05/17/22 0718 05/18/22 0545 05/19/22 0526 05/20/22 0522  NA 131* 132* 134* 133* 135 137  K 4.2 3.8 4.0 4.8 4.0 4.2  CL 95* 98 99 100 104 105  CO2  $'26 28 29 27 26 25  'R$ GLUCOSE 246* 214* 153* 185* 161* 205*  BUN 34* 27* 20 37* 30* 23  CREATININE 0.91 0.86 0.87 1.28* 1.09* 0.99  CALCIUM 8.6* 8.5* 8.6* 8.4* 8.1* 7.9*  MG 2.0  --  1.9 1.9  --   --   PHOS 2.5  --   --   --   --   --    Liver Function Tests: Recent Labs  Lab 05/15/22 0352  ALBUMIN 3.0*   No results for input(s): "LIPASE", "AMYLASE" in the last 168 hours. No results for input(s): "AMMONIA" in the last 168 hours. CBC: Recent Labs  Lab 05/15/22 0352 05/16/22 0549 05/17/22 0718 05/18/22 0545 05/19/22 0526 05/20/22 0522  WBC 19.6* 16.1* 14.7* 11.0* 8.0 7.9  NEUTROABS 17.7* 14.2* 12.2*  --  6.0 6.2  HGB 10.4* 10.9* 12.0 10.0* 9.1* 9.0*  HCT 31.9* 34.3* 38.5 31.8* 28.6* 29.1*  MCV 88.9 90.3 92.8 92.4 92.3 95.4  PLT PLATELET CLUMPS NOTED ON SMEAR, UNABLE TO ESTIMATE PLATELET CLUMPS NOTED ON SMEAR, UNABLE TO ESTIMATE PLATELET CLUMPS NOTED ON SMEAR, UNABLE TO ESTIMATE PLATELET CLUMPS NOTED ON SMEAR, UNABLE TO ESTIMATE 62* PLATELET CLUMPS NOTED ON SMEAR, UNABLE TO ESTIMATE   Cardiac Enzymes: No results for input(s): "CKTOTAL", "CKMB", "CKMBINDEX", "TROPONINI" in the  last 168 hours. BNP: Invalid input(s): "POCBNP" CBG: Recent Labs  Lab 05/20/22 0725 05/20/22 1125 05/20/22 1652 05/20/22 2105 05/21/22 0736  GLUCAP 194* 152* 117* 165* 100*   D-Dimer No results for input(s): "DDIMER" in the last 72 hours. Hgb A1c No results for input(s): "HGBA1C" in the last 72 hours. Lipid Profile No results for input(s): "CHOL", "HDL", "LDLCALC", "TRIG", "CHOLHDL", "LDLDIRECT" in the last 72 hours. Thyroid function studies No results for input(s): "TSH", "T4TOTAL", "T3FREE", "THYROIDAB" in the last 72 hours.  Invalid input(s): "FREET3" Anemia work up No results for input(s): "VITAMINB12", "FOLATE", "FERRITIN", "TIBC", "IRON", "RETICCTPCT" in the last 72 hours. Urinalysis    Component Value Date/Time   COLORURINE YELLOW 05/13/2022 0033   APPEARANCEUR CLEAR 05/13/2022 0033   LABSPEC 1.022 05/13/2022 0033   PHURINE 6.0 05/13/2022 0033   GLUCOSEU >=500 (A) 05/13/2022 0033   HGBUR SMALL (A) 05/13/2022 0033   BILIRUBINUR NEGATIVE 05/13/2022 0033   KETONESUR NEGATIVE 05/13/2022 0033   PROTEINUR NEGATIVE 05/13/2022 0033   UROBILINOGEN 0.2 12/05/2010 1740   NITRITE NEGATIVE 05/13/2022 0033   LEUKOCYTESUR SMALL (A) 05/13/2022 0033   Sepsis Labs Recent Labs  Lab 05/17/22 0718 05/18/22 0545 05/19/22 0526 05/20/22 0522  WBC 14.7* 11.0* 8.0 7.9   Microbiology Recent Results (from the past 240 hour(s))  Urine Culture     Status: Abnormal   Collection Time: 05/13/22  9:33 PM   Specimen: In/Out Cath Urine  Result Value Ref Range Status   Specimen Description   Final    IN/OUT CATH URINE Performed at [facility], [place] [address], [place], [place] [address]    Special Requests   Final    NONE Performed at [facility], [place] [address], [place], [place] [address]    Culture >=100,000 COLONIES/mL ESCHERICHIA COLI (A)  Final   Report Status 05/17/2022 FINAL  Final   Organism ID, Bacteria ESCHERICHIA COLI (A)  Final       Susceptibility   Escherichia coli - MIC*    AMPICILLIN >=32 RESISTANT Resistant     CEFAZOLIN >=64 RESISTANT Resistant     CEFEPIME <=0.12 SENSITIVE Sensitive     CEFTRIAXONE <=0.25 SENSITIVE Sensitive  CIPROFLOXACIN <=0.25 SENSITIVE Sensitive     GENTAMICIN <=1 SENSITIVE Sensitive     IMIPENEM <=0.25 SENSITIVE Sensitive     NITROFURANTOIN <=16 SENSITIVE Sensitive     TRIMETH/SULFA <=20 SENSITIVE Sensitive     AMPICILLIN/SULBACTAM >=32 RESISTANT Resistant     PIP/TAZO 8 SENSITIVE Sensitive     * >=100,000 COLONIES/mL ESCHERICHIA COLI    SIGNED:   Charlynne Cousins, MD  [facility] 05/21/2022, 10:45 AM Pager   If 7PM-7AM, please contact night-coverage [email or web address] Password [email or web address]

## 2022-05-22 DIAGNOSIS — M6281 Muscle weakness (generalized): Secondary | ICD-10-CM | POA: Diagnosis not present

## 2022-05-22 DIAGNOSIS — J9611 Chronic respiratory failure with hypoxia: Secondary | ICD-10-CM | POA: Diagnosis not present

## 2022-05-22 DIAGNOSIS — J449 Chronic obstructive pulmonary disease, unspecified: Secondary | ICD-10-CM | POA: Diagnosis not present

## 2022-05-22 DIAGNOSIS — I10 Essential (primary) hypertension: Secondary | ICD-10-CM | POA: Diagnosis not present

## 2022-05-22 DIAGNOSIS — Z7984 Long term (current) use of oral hypoglycemic drugs: Secondary | ICD-10-CM | POA: Diagnosis not present

## 2022-05-22 DIAGNOSIS — E871 Hypo-osmolality and hyponatremia: Secondary | ICD-10-CM | POA: Diagnosis not present

## 2022-05-22 DIAGNOSIS — E785 Hyperlipidemia, unspecified: Secondary | ICD-10-CM | POA: Diagnosis not present

## 2022-05-22 DIAGNOSIS — I509 Heart failure, unspecified: Secondary | ICD-10-CM | POA: Diagnosis not present

## 2022-05-22 DIAGNOSIS — E1100 Type 2 diabetes mellitus with hyperosmolarity without nonketotic hyperglycemic-hyperosmolar coma (NKHHC): Secondary | ICD-10-CM | POA: Diagnosis not present

## 2022-05-22 DIAGNOSIS — K589 Irritable bowel syndrome without diarrhea: Secondary | ICD-10-CM | POA: Diagnosis not present

## 2022-05-22 DIAGNOSIS — N179 Acute kidney failure, unspecified: Secondary | ICD-10-CM | POA: Diagnosis not present

## 2022-05-22 DIAGNOSIS — Z7401 Bed confinement status: Secondary | ICD-10-CM | POA: Diagnosis not present

## 2022-05-22 DIAGNOSIS — R41841 Cognitive communication deficit: Secondary | ICD-10-CM | POA: Diagnosis not present

## 2022-05-22 DIAGNOSIS — I82409 Acute embolism and thrombosis of unspecified deep veins of unspecified lower extremity: Secondary | ICD-10-CM | POA: Diagnosis not present

## 2022-05-22 DIAGNOSIS — U071 COVID-19: Secondary | ICD-10-CM | POA: Diagnosis not present

## 2022-05-22 DIAGNOSIS — M6259 Muscle wasting and atrophy, not elsewhere classified, multiple sites: Secondary | ICD-10-CM | POA: Diagnosis not present

## 2022-05-22 DIAGNOSIS — Z7985 Long-term (current) use of injectable non-insulin antidiabetic drugs: Secondary | ICD-10-CM | POA: Diagnosis not present

## 2022-05-22 DIAGNOSIS — Z7901 Long term (current) use of anticoagulants: Secondary | ICD-10-CM | POA: Diagnosis not present

## 2022-05-22 DIAGNOSIS — R739 Hyperglycemia, unspecified: Secondary | ICD-10-CM | POA: Diagnosis not present

## 2022-05-22 DIAGNOSIS — Z9049 Acquired absence of other specified parts of digestive tract: Secondary | ICD-10-CM | POA: Diagnosis not present

## 2022-05-22 DIAGNOSIS — E1122 Type 2 diabetes mellitus with diabetic chronic kidney disease: Secondary | ICD-10-CM | POA: Diagnosis not present

## 2022-05-22 DIAGNOSIS — H409 Unspecified glaucoma: Secondary | ICD-10-CM | POA: Diagnosis not present

## 2022-05-22 DIAGNOSIS — Z9181 History of falling: Secondary | ICD-10-CM | POA: Diagnosis not present

## 2022-05-22 DIAGNOSIS — R1313 Dysphagia, pharyngeal phase: Secondary | ICD-10-CM | POA: Diagnosis not present

## 2022-05-22 DIAGNOSIS — Z8719 Personal history of other diseases of the digestive system: Secondary | ICD-10-CM | POA: Diagnosis not present

## 2022-05-22 DIAGNOSIS — N1832 Chronic kidney disease, stage 3b: Secondary | ICD-10-CM | POA: Diagnosis not present

## 2022-05-22 DIAGNOSIS — I130 Hypertensive heart and chronic kidney disease with heart failure and stage 1 through stage 4 chronic kidney disease, or unspecified chronic kidney disease: Secondary | ICD-10-CM | POA: Diagnosis not present

## 2022-05-22 DIAGNOSIS — Z741 Need for assistance with personal care: Secondary | ICD-10-CM | POA: Diagnosis not present

## 2022-05-22 DIAGNOSIS — R2689 Other abnormalities of gait and mobility: Secondary | ICD-10-CM | POA: Diagnosis not present

## 2022-05-22 DIAGNOSIS — Z9981 Dependence on supplemental oxygen: Secondary | ICD-10-CM | POA: Diagnosis not present

## 2022-05-22 DIAGNOSIS — Z86718 Personal history of other venous thrombosis and embolism: Secondary | ICD-10-CM | POA: Diagnosis not present

## 2022-05-22 DIAGNOSIS — I5032 Chronic diastolic (congestive) heart failure: Secondary | ICD-10-CM | POA: Diagnosis not present

## 2022-05-22 LAB — GLUCOSE, CAPILLARY
Glucose-Capillary: 56 mg/dL — ABNORMAL LOW (ref 70–99)
Glucose-Capillary: 81 mg/dL (ref 70–99)
Glucose-Capillary: 145 mg/dL — ABNORMAL HIGH (ref 70–99)

## 2022-05-22 MED ORDER — INSULIN GLARGINE-YFGN 100 UNIT/ML ~~LOC~~ SOLN
7.0000 [IU] | Freq: Two times a day (BID) | SUBCUTANEOUS | Status: DC
  Filled 2022-05-22 (×2): qty 0.07

## 2022-05-22 NOTE — Progress Notes (Signed)
Hypoglycemic Event  CBG: 56 @ 7:40am  Treatment: 4 oz juice/soda  Symptoms: None  Follow-up CBG: Time:8:15am  CBG Result:81  Possible Reasons for Event: Unknown  Comments/MD notified: MD made aware   [name] A [name]

## 2022-05-22 NOTE — TOC Transition Note (Addendum)
Transition of Care [facility]) - CM/SW Discharge Note   Patient Details  Name: [name] MRN: [id number] Date of Birth: 1931/02/05  [facility] CM/SW Contact:  Dessa Phi, RN Phone Number: 05/22/2022, 10:13 AM   Clinical Narrative:  d/c today [facility] rep Kitty aware.awaiting rm#,report tel#,then PTAR. No further CM needs. -11:48a-going to rm#111,report tel#[phone]. PTAR called.No further CM needs.     Final next level of care: Skilled Nursing Facility Barriers to Discharge: No Barriers Identified   Patient Goals and CMS Choice CMS Medicare.gov Compare Post Acute Care list provided to:: Patient Represenative (must comment) Choice offered to / list presented to : Adult Children  Discharge Placement                Patient chooses bed at: [facility] Patient to be transferred to facility by:  Corey Harold) Name of family member notified:  (Scottie(dtr)) Patient and family notified of of transfer: 05/22/22  Discharge Plan and Services Additional resources added to the After Visit Summary for     Discharge Planning Services: CM Consult                      HH Arranged: RN, PT, OT, Social Work, Nurse's Aide [place] Agency: [place] Date [facility] Agency Contacted: 05/16/22   Representative spoke with at [place]: [place] ([place]) Interventions SDOH Screenings   Food Insecurity: No Food Insecurity (05/16/2022)  Housing: Low Risk  (05/03/2022)  Transportation Needs: No Transportation Needs (05/16/2022)  Utilities: Not At Risk (05/16/2022)  Tobacco Use: Low Risk  (05/13/2022)     Readmission Risk Interventions    05/07/2022    3:17 PM  Readmission Risk Prevention Plan  Transportation Screening Complete  PCP or Specialist Appt within 3-5 Days Complete  HRI or [place] Complete  Social Work Consult for [place] Planning/Counseling Complete  Palliative Care Screening Not Applicable   Medication Review Press photographer) Complete

## 2022-05-22 NOTE — Discharge Summary (Addendum)
Physician Discharge Summary  [name] SFK:[id number] DOB: 1930/12/12 DOA: 05/13/2022  PCP: [name], DO  Admit date: 05/13/2022 Discharge date: 05/22/2022  Admitted From: Home Disposition:  SNF  Recommendations for Outpatient Follow-up:  Follow up with PCP in 1-2 weeks Please obtain BMP/CBC in one week   Home Health:No Equipment/Devices:None  Discharge Condition:Stable CODE STATUS:Full Diet recommendation: Heart Healthy   Brief/Interim Summary: 89 y.o. female past medical history of essential hypertension, chronic respiratory failure with hypoxia/COPD on 4 L of oxygen, chronic diastolic heart failure recently diagnosed bilateral lower extremity DVT (PCP on the day of admission was unable to refill his Eliquis for days after discharge, started on Xarelto by his PCP in the PCPs office was noted to be hyperkalemic given Lokelma, was also found to be hyperglycemic and confused subsequently sent to the ED    Discharge Diagnoses:  Principal Problem:   Hyperosmolar hyperglycemic state (HHS) ([place]) Active Problems:   AKI (acute kidney injury) ([place])   Chronic diastolic CHF (congestive heart failure) ([place])   Diabetes mellitus ([place])   Essential hypertension   Chronic obstructive pulmonary disease, unspecified ([place])   Obstructive sleep apnea syndrome   Chronic hypoxic respiratory failure ([place])   History of DVT (deep vein thrombosis)   Elevated lactic acid level   Hyperkalemia   Acute bronchitis   UTI (urinary tract infection)   Pressure injury of skin   Rectal bleeding  Hyperosmolar hyperglycemic nonketotic state/diabetes mellitus type 2: Started on an insulin drip her blood glucose corrected her A1c was 10.3. She was transition to long-acting insulin per sliding scale PT evaluated the patient recommended home health PT. She was switched back to her home dose of metformin glipizide was added.  Hyperkalemia: Likely due to hyperosmolar state now resolved.  Acute kidney  injury: Likely prerenal azotemia resolved with IV fluid hydration.  Chronic diastolic heart failure: Appears euvolemic no changes made to her medication.  Recently diagnosed with bilateral DVTs: Was not able to get Eliquis as an outpatient she was switched to oral Xarelto which she will continue as an outpatient.  Elevated lactic acid: Likely due to dehydration.  Essential hypertension: Antihypertensive medications were held on admission they will be resumed as an outpatient.  Possible aspiration/chronic respiratory failure with hypoxia on 4 L of oxygen at home: Chest x-ray was done that showed increased left lower lobe infiltrate she was started on IV Unasyn she will continue Augmentin as an outpatient.  Struct of sleep apnea: Noncompliant with her CPAP.  E. coli UTI: Grew more than 100,000 colonies, Augmentin should cover.  Single episode of rectal bleeding: Hemoglobin remained stable likely hemorrhoidal bleed follow-up with PCP as an outpatient.  Discharge Instructions  Discharge Instructions     Diet - low sodium heart healthy   Complete by: As directed    Increase activity slowly   Complete by: As directed    No wound care   Complete by: As directed       Allergies as of 05/22/2022       Reactions   Cephalexin Anaphylaxis, Swelling, Other (See Comments)   Micardis [telmisartan] Other (See Comments)   Hyperkalemia during hospitalization with pulmonary edema   Atorvastatin Other (See Comments)   Other reaction(s): myalgia   Buspirone Other (See Comments)   Other reaction(s): "couldn't breath"   Canagliflozin Other (See Comments)   Pt does not recall   Doxycycline Hyclate Other (See Comments)   Other reaction(s): nausea   Lipitor [atorvastatin Calcium] Other (See Comments)  myalgia   Tramadol Nausea Only   Tramadol Hcl Other (See Comments)        Medication List     STOP taking these medications    ascorbic acid 500 MG tablet Commonly known as:  VITAMIN C   fluticasone 50 MCG/ACT nasal spray Commonly known as: FLONASE   ipratropium-albuterol 0.5-2.5 (3) MG/3ML Soln Commonly known as: DUONEB   predniSONE 10 MG (21) Tbpk tablet Commonly known as: STERAPRED UNI-PAK 21 TAB       TAKE these medications    acetaminophen 325 MG tablet Commonly known as: TYLENOL Take 2 tablets (650 mg total) by mouth every 6 (six) hours as needed for mild pain (or Fever >/= 101).   amLODipine 10 MG tablet Commonly known as: NORVASC TAKE 1 TABLET(10 MG) BY MOUTH DAILY What changed: See the new instructions.   amoxicillin-clavulanate 875-125 MG tablet Commonly known as: AUGMENTIN Take 1 tablet by mouth 2 (two) times daily for 3 days.   glipiZIDE 5 MG tablet Commonly known as: GLUCOTROL Take 1 tablet (5 mg total) by mouth 2 (two) times daily.   isosorbide mononitrate 60 MG 24 hr tablet Commonly known as: IMDUR TAKE 1 TABLET DAILY   liraglutide 18 MG/3ML Sopn Commonly known as: VICTOZA Inject 0.6 mg into the skin daily.   loratadine 10 MG tablet Commonly known as: CLARITIN Take 1 tablet (10 mg total) by mouth daily.   metFORMIN 1000 MG tablet Commonly known as: GLUCOPHAGE Take 0.5 tablets (500 mg total) by mouth daily.   pantoprazole 40 MG tablet Commonly known as: PROTONIX Take 1 tablet (40 mg total) by mouth daily.   Rivaroxaban 15 MG Tabs tablet Commonly known as: XARELTO Take 1 tablet (15 mg total) by mouth 2 (two) times daily.   rivaroxaban 20 MG Tabs tablet Commonly known as: XARELTO Take 1 tablet (20 mg total) by mouth daily with supper. Start taking on: June 03, 2022   Rocklatan 0.02-0.005 % Soln Generic drug: Netarsudil-Latanoprost Place 1 drop into both eyes at bedtime.   Simbrinza 1-0.2 % Susp Generic drug: Brinzolamide-Brimonidine Apply 2 drops to eye daily.   valACYclovir 1000 MG tablet Commonly known as: VALTREX Place 1 tablet (1,000 mg total) into feeding tube 3 (three) times daily for 3 days.    zinc sulfate 220 (50 Zn) MG capsule Take 1 capsule (220 mg total) by mouth daily.        Contact information for after-discharge care     Destination     [facility] .   Service: Skilled Nursing Contact information: [address] [address] [phone]                    Allergies  Allergen Reactions   Cephalexin Anaphylaxis, Swelling and Other (See Comments)   Micardis [Telmisartan] Other (See Comments)    Hyperkalemia during hospitalization with pulmonary edema   Atorvastatin Other (See Comments)    Other reaction(s): myalgia   Buspirone Other (See Comments)    Other reaction(s): "couldn't breath"   Canagliflozin Other (See Comments)    Pt does not recall   Doxycycline Hyclate Other (See Comments)    Other reaction(s): nausea   Lipitor [Atorvastatin Calcium] Other (See Comments)    myalgia   Tramadol Nausea Only   Tramadol Hcl Other (See Comments)    Consultations: None   Procedures/Studies: DG CHEST PORT 1 VIEW  Result Date: 05/19/2022 CLINICAL DATA:  [address] with cough. History of  CHF, COPD and diabetes. EXAM: PORTABLE CHEST 1 VIEW COMPARISON:  Portable chest 05/15/2022, portable chest 05/13/2022, CTA chest 12/06/2019 FINDINGS: 2:59 p.m. There is increased left lower lobe opacity in the retrocardiac area concerning for pneumonia or aspiration. Remaining lungs hyperexpanded and otherwise clear. The sulci are sharp. There is mild cardiomegaly, stable vascular prominence of the right hilum, stable mediastinum with aortic tortuosity and calcification. There is osteopenia with degenerative changes and mild dextroscoliosis of the thoracic spine. IMPRESSION: 1. Increased left lower lobe retrocardiac opacity concerning for pneumonia or aspiration. 2. COPD. 3. Aortic atherosclerosis. 4. Osteopenia, scoliosis and degenerative changes. Electronically Signed   By: Telford Nab M.D.   On: 05/19/2022 03:55    DG Swallowing Func-Speech Pathology  Result Date: 05/18/2022 Table formatting from the original result was not included. Objective Swallowing Evaluation: Type of Study: MBS-Modified Barium Swallow Study  Patient Details Name: SARALEE BOLICK MRN: [id number] Date of Birth: 03-05-1931 Today's Date: 05/18/2022 Time: SLP Start Time (ACUTE ONLY): 50 -SLP Stop Time (ACUTE ONLY): 9767 SLP Time Calculation (min) (ACUTE ONLY): 20 min Past Medical History: Past Medical History: Diagnosis Date  Cellulitis and abscess of left leg   CHF (congestive heart failure) ([facility])   COPD (chronic obstructive pulmonary disease) ([place])   Diabetes mellitus   Glaucoma   Hyperlipidemia   Hypertension  Past Surgical History: Past Surgical History: Procedure Laterality Date  BACK SURGERY    CARDIAC CATHETERIZATION N/A 12/09/2015  Procedure: Right Heart Cath;  Surgeon: Adrian Prows, MD;  Location: [facility];  Service: Cardiovascular;  Laterality: N/A;  CHOLECYSTECTOMY  2000  HERNIA REPAIR  2007  rupture disk  1970"s  SHOULDER ARTHROSCOPY WITH ROTATOR CUFF REPAIR  1999  rt shoulder HPI: Patient is a 89 year old female history of hypertension, hyperlipidemia, chronic respiratory failure/COPD on 4 L nasal cannula, diastolic CHF, recently diagnosed bilateral lower extremity DVT, recent history of COVID, went to follow-up with PCP on day of admission, noted to have been unable to get her Eliquis for the past 6 days since discharge as pharmacy did not have it available, patient started on Xarelto per PCP.  Patient seen at PCPs office noted to have hyperkalemia given [facility], also noted to be hyperglycemic, noted to have some confusion and subsequently presented to the ED.  Patient seen in the ED placed on Endo tool/insulin drip.  Subjective: pleasant, has been coughing all night and today as well  Recommendations for follow up therapy are one component of a multi-disciplinary discharge planning process, led by the attending physician.   Recommendations may be updated based on patient status, additional functional criteria and insurance authorization. Assessment / Plan / Recommendation   05/18/2022   3:40 PM Clinical Impressions Clinical Impression Patient presents with a mild pharyngeal phase dysphagia but with oral phase WFL. SLP used the following barium consistencies: thin, nectar thick, puree, 55m barium tablet. During pharyngeal phase, patient exhibited decreased hyolaryngeal movement leading to trace to mild vallecular and pyriform sinus residuals with thin liquids and mild vallecular sinus residuals, trace pyriform residuals with nectar thick liquid. No significant amount of PO residuals observed with puree solids taken with 188mbarium tablet. Patient exhibited trace penetration of thin liquid barium into laryngeal vestibule, however airway appeared well protected. Penetration was sensed by patient, who exhibited a throat clear and cough. Slightly increased amount of vallecular residuals observed with nectar thick liquid, but no penetration. With thin and nectar thick liquids, patient able to clear majority of barium residuals with 1-2 dry  swallows and during penetration events, she was able to clear laryngeal vestibule with cued cough and reswallow. No aspiration observed with any of the tested barium consistencies.  As patient continues with significant amount of coughing, with some of this coughing likely due to sensed penetration, SLP recommending to continue with nectar thick liquids at this time but SLP will plan for trials of upgraded liquids (thin consistency with anticipation of good progress overall. SLP Visit Diagnosis Dysphagia, pharyngeal phase (R13.13);Dysphagia, pharyngoesophageal phase (R13.14) Impact on safety and function Mild aspiration risk     05/18/2022   3:40 PM Treatment Recommendations Treatment Recommendations Therapy as outlined in treatment plan below     05/18/2022   3:44 PM Prognosis Prognosis for Safe Diet  Advancement Good   05/18/2022   3:40 PM Diet Recommendations SLP Diet Recommendations Dysphagia 3 (Mech soft) solids;Nectar thick liquid Liquid Administration via Cup;Straw Medication Administration Whole meds with puree Compensations Slow rate;Small sips/bites Postural Changes Seated upright at 90 degrees     05/18/2022   3:40 PM Other Recommendations Oral Care Recommendations Oral care BID Follow Up Recommendations Follow physician's recommendations for discharge plan and follow up therapies Functional Status Assessment Patient has had a recent decline in their functional status and demonstrates the ability to make significant improvements in function in a reasonable and predictable amount of time.   05/18/2022   3:40 PM Frequency and Duration  Speech Therapy Frequency (ACUTE ONLY) min 2x/week Treatment Duration 1 week     05/18/2022   3:38 PM Oral Phase Oral Phase [facility]    05/18/2022   3:38 PM Pharyngeal Phase Pharyngeal Phase Impaired Pharyngeal- Thin Straw Reduced anterior laryngeal mobility;Penetration/Aspiration during swallow;Pharyngeal residue - valleculae;Pharyngeal residue - pyriform Pharyngeal Material enters airway, remains ABOVE vocal cords then ejected out;Material does not enter airway Pharyngeal- Puree Reduced pharyngeal peristalsis Pharyngeal- Pill [facility]    05/18/2022   3:39 PM Cervical Esophageal Phase  Cervical Esophageal Phase Impaired Cervical Esophageal Comment appearance of cricopharyngeal bar which did not appear to significantly impact barium transist at level of cervical esophagus Sonia Baller, MA, CCC-SLP Speech Therapy                     DG CHEST PORT 1 VIEW  Result Date: 05/15/2022 CLINICAL DATA:  Cough EXAM: PORTABLE CHEST 1 VIEW COMPARISON:  Chest x-ray dated May 13, 2022 FINDINGS: Cardiac and mediastinal contours are unchanged. Mild bibasilar opacities, likely atelectasis. No large pleural effusion no evidence of pneumothorax. IMPRESSION: Mild bibasilar opacities, likely  atelectasis. Electronically Signed   By: Yetta Glassman M.D.   On: 05/15/2022 08:12   CT Head Wo Contrast  Result Date: 05/13/2022 CLINICAL DATA:  Hyperkalemia, diabetes, mental status change EXAM: CT HEAD WITHOUT CONTRAST TECHNIQUE: Contiguous axial images were obtained from the base of the skull through the vertex without intravenous contrast. RADIATION DOSE REDUCTION: This exam was performed according to the departmental dose-optimization program which includes automated exposure control, adjustment of the mA and/or kV according to patient size and/or use of iterative reconstruction technique. COMPARISON:  10/12/2013 FINDINGS: Brain: No acute infarct or hemorrhage. Hypodensities in the periventricular white matter consistent with chronic small vessel ischemic change. The lateral ventricles and remaining midline structures are unremarkable. No acute extra-axial fluid collections. No mass effect. Vascular: Atherosclerosis of the internal carotid arteries. No hyperdense vessel. Skull: Normal. Negative for fracture or focal lesion. Sinuses/Orbits: Mucous retention cyst or polypoid mucosal thickening right posterior ethmoid air cell, stable. The remaining paranasal sinuses are  clear. Other: None. IMPRESSION: 1. No acute intracranial process. Electronically Signed   By: Randa Ngo M.D.   On: 05/13/2022 21:31   DG Chest Portable 1 View  Result Date: 05/13/2022 CLINICAL DATA:  Altered mental status, weakness, shortness of breath, COVID. EXAM: PORTABLE CHEST 1 VIEW COMPARISON:  05/07/2022. FINDINGS: Heart is enlarged and the mediastinal contour stable. There is atherosclerotic calcification of the aorta. Lung volumes are low and there is mild atelectasis is present at the left lung base. No consolidation, effusion, or pneumothorax. Evidence of prior rotator cuff repair is noted on the right. Degenerative changes in the thoracic spine. No acute osseous abnormality. IMPRESSION: 1. Mild atelectasis at the left  lung base. 2. Cardiomegaly. Electronically Signed   By: Brett Fairy M.D.   On: 05/13/2022 20:39   DG CHEST PORT 1 VIEW  Result Date: 05/07/2022 CLINICAL DATA:  Shortness of breath.  COVID infection. EXAM: PORTABLE CHEST 1 VIEW COMPARISON:  05/03/2022 FINDINGS: Right greater than left hilar prominence shown on prior CT exam to be primarily vascular in nature. Atherosclerotic calcification of the aortic arch. Heart size within normal limits for AP projection. Subsegmental atelectasis or scarring at the left lung base. The lungs appear otherwise clear. IMPRESSION: 1. Subsegmental atelectasis or scarring at the left lung base. 2. Right greater than left hilar prominence shown on prior CT exam to be primarily vascular in nature. 3.  Aortic Atherosclerosis (ICD10-I70.0). Electronically Signed   By: Van Clines M.D.   On: 05/07/2022 07:35   VAS [place] LOWER EXTREMITY VENOUS (DVT)  Result Date: 05/04/2022  Lower Venous DVT Study Patient Name:  [name]  Date of Exam:   05/04/2022 Medical Rec #: [id number]        Accession #:    [id number] Date of Birth: 1930-08-30        Patient Gender: F Patient Age:   23 years Exam Location:  [facility] Procedure:      VAS [place] LOWER EXTREMITY VENOUS (DVT) Referring Phys: Quillian Quince THOMPSON --------------------------------------------------------------------------------  Indications: Elevated Ddimer.  Risk Factors: COVID 19 positive. Comparison Study: No prior studies. Performing Technologist: Oliver Hum RVT  Examination Guidelines: A complete evaluation includes B-mode imaging, spectral Doppler, color Doppler, and power Doppler as needed of all accessible portions of each vessel. Bilateral testing is considered an integral part of a complete examination. Limited examinations for reoccurring indications may be performed as noted. The reflux portion of the exam is performed with the patient in reverse Trendelenburg.   +---------+---------------+---------+-----------+----------+--------------+ RIGHT    CompressibilityPhasicitySpontaneityPropertiesThrombus Aging +---------+---------------+---------+-----------+----------+--------------+ CFV      Full           Yes      Yes                                 +---------+---------------+---------+-----------+----------+--------------+ SFJ      Full                                                        +---------+---------------+---------+-----------+----------+--------------+ FV Prox  Full                                                        +---------+---------------+---------+-----------+----------+--------------+  FV Mid   Full                                                        +---------+---------------+---------+-----------+----------+--------------+ FV DistalFull                                                        +---------+---------------+---------+-----------+----------+--------------+ PFV      Full                                                        +---------+---------------+---------+-----------+----------+--------------+ POP      Partial        Yes      Yes                  Acute          +---------+---------------+---------+-----------+----------+--------------+ PTV      Partial                                      Acute          +---------+---------------+---------+-----------+----------+--------------+ PERO     Partial                                      Acute          +---------+---------------+---------+-----------+----------+--------------+ Gastroc  Full                                                        +---------+---------------+---------+-----------+----------+--------------+   +---------+---------------+---------+-----------+----------+--------------+ LEFT     CompressibilityPhasicitySpontaneityPropertiesThrombus Aging  +---------+---------------+---------+-----------+----------+--------------+ CFV      Full           Yes      Yes                                 +---------+---------------+---------+-----------+----------+--------------+ SFJ      Full                                                        +---------+---------------+---------+-----------+----------+--------------+ FV Prox  Full                                                        +---------+---------------+---------+-----------+----------+--------------+ FV Mid   Full                                                        +---------+---------------+---------+-----------+----------+--------------+  FV DistalFull                                                        +---------+---------------+---------+-----------+----------+--------------+ PFV      Full                                                        +---------+---------------+---------+-----------+----------+--------------+ POP      Full           Yes      Yes                                 +---------+---------------+---------+-----------+----------+--------------+ PTV      None                                         Acute          +---------+---------------+---------+-----------+----------+--------------+ PERO     None                                         Acute          +---------+---------------+---------+-----------+----------+--------------+     Summary: RIGHT: - Findings consistent with acute deep vein thrombosis involving the right popliteal vein, right posterior tibial veins, and right peroneal veins. - No cystic structure found in the popliteal fossa.  LEFT: - Findings consistent with acute deep vein thrombosis involving the left posterior tibial veins, and left peroneal veins. - No cystic structure found in the popliteal fossa.  *See table(s) above for measurements and observations. Electronically signed by Monica Martinez MD on  05/04/2022 at 3:48:50 PM.    Final    US Abdomen Complete  Result Date: 05/03/2022 CLINICAL DATA:  Acute kidney injury, elevated LFTs. EXAM: ABDOMEN ULTRASOUND COMPLETE COMPARISON:  CT abdomen and pelvis 11/14/2010. CT angiogram chest 12/06/2019. FINDINGS: Gallbladder: Gallbladder is surgically absent. Common bile duct: Diameter: 10 mm, similar to the prior study. There is mild intrahepatic biliary ductal dilatation. Liver: No focal lesion identified. Within normal limits in parenchymal echogenicity. Portal vein is patent on color Doppler imaging with normal direction of blood flow towards the liver. IVC: No abnormality visualized. Pancreas: Limited evaluation secondary to overlying bowel gas and body habitus. Spleen: Normal in size. There is a 3.4 x 2.8 x 2.4 cm cyst in the spleen which has increased in size compared to prior exam. Right Kidney: Length: 9.3 cm. Echogenicity within normal limits. No mass or hydronephrosis visualized. Left Kidney: Length: 9.7 cm. Echogenicity within normal limits. No hydronephrosis. There are 2 cysts identified in the left kidney. One measures 2.0 x 1.2 x 2.2 cm. The other measures 2.1 x 2.3 x 2.6 cm. Abdominal aorta: No aneurysm visualized. Visualized portion measures up to 2.4 cm. Distal abdominal aorta and bifurcation not visualized secondary to overlying bowel gas. Other findings: None. IMPRESSION: 1. No acute abnormality. 2. Status post cholecystectomy. 3. Mild intrahepatic and extrahepatic  biliary ductal dilatation, similar to the prior study. 4. Left renal cysts. 5. Splenic cyst which has increased in size compared to prior exam. Electronically Signed   By: Ronney Asters M.D.   On: 05/03/2022 15:27   DG Chest Port 1 View  Result Date: 05/03/2022 CLINICAL DATA:  Shortness of breath with productive cough for 1 week. Recently diagnosed with COVID. EXAM: PORTABLE CHEST 1 VIEW COMPARISON:  Radiographs 06/16/2021 and 12/08/2020.  CT 12/06/2019. FINDINGS: 0953 hours. The heart  size and mediastinal contours are stable with aortic atherosclerosis. There is stable mild atelectasis or scarring at the left lung base. No new airspace disease, edema, significant pleural effusion or pneumothorax. The bones appear unchanged with mild thoracic spondylosis and postsurgical changes at the right shoulder. No acute osseous findings are demonstrated. Telemetry leads overlie the chest. IMPRESSION: No evidence of acute cardiopulmonary process. Stable mild left basilar atelectasis or scarring. Electronically Signed   By: Richardean Sale M.D.   On: 05/03/2022 10:37   (Echo, Carotid, EGD, Colonoscopy, ERCP)    Subjective: No complaints  Discharge Exam: Vitals:   05/21/22 2046 05/22/22 0439  BP: (!) 140/61 (!) 152/69  Pulse: 84 92  Resp: (!) 24 (!) 22  Temp: 98.6 F (37 C) 98.4 F (36.9 C)  SpO2: 98% 96%   Vitals:   05/21/22 0430 05/21/22 1340 05/21/22 2046 05/22/22 0439  BP: 128/63 (!) 153/70 (!) 140/61 (!) 152/69  Pulse: (!) 103 (!) 101 84 92  Resp: 20 20 (!) 24 (!) 22  Temp: (!) 97.5 F (36.4 C) 98.4 F (36.9 C) 98.6 F (37 C) 98.4 F (36.9 C)  TempSrc: Oral Oral Oral Oral  SpO2: 99% 100% 98% 96%  Weight:      Height:        General: Pt is alert, awake, not in acute distress Cardiovascular: RRR, S1/S2 +, no rubs, no gallops Respiratory: CTA bilaterally, no wheezing, no rhonchi Abdominal: Soft, NT, ND, bowel sounds + Extremities: no edema, no cyanosis    The results of significant diagnostics from this hospitalization (including imaging, microbiology, ancillary and laboratory) are listed below for reference.     Microbiology: Recent Results (from the past 240 hour(s))  Urine Culture     Status: Abnormal   Collection Time: 05/13/22  9:33 PM   Specimen: In/Out Cath Urine  Result Value Ref Range Status   Specimen Description   Final    IN/OUT CATH URINE Performed at [facility] [address], [place], [place] [address]    Special  Requests   Final    NONE Performed at [facility], [place] [address], [place], [place] [address]    Culture >=100,000 COLONIES/mL ESCHERICHIA COLI (A)  Final   Report Status 05/17/2022 FINAL  Final   Organism ID, Bacteria ESCHERICHIA COLI (A)  Final      Susceptibility   Escherichia coli - MIC*    AMPICILLIN >=32 RESISTANT Resistant     CEFAZOLIN >=64 RESISTANT Resistant     CEFEPIME <=0.12 SENSITIVE Sensitive     CEFTRIAXONE <=0.25 SENSITIVE Sensitive     CIPROFLOXACIN <=0.25 SENSITIVE Sensitive     GENTAMICIN <=1 SENSITIVE Sensitive     IMIPENEM <=0.25 SENSITIVE Sensitive     NITROFURANTOIN <=16 SENSITIVE Sensitive     TRIMETH/SULFA <=20 SENSITIVE Sensitive     AMPICILLIN/SULBACTAM >=32 RESISTANT Resistant     PIP/TAZO 8 SENSITIVE Sensitive     * >=100,000 COLONIES/mL ESCHERICHIA COLI     Labs:  BNP (last 3 results) Recent Labs    05/03/22 1000  BNP 242.3*   Basic Metabolic Panel: Recent Labs  Lab 05/16/22 0549 05/17/22 0718 05/18/22 0545 05/19/22 0526 05/20/22 0522  NA 132* 134* 133* 135 137  K 3.8 4.0 4.8 4.0 4.2  CL 98 99 100 104 105  CO2 '28 29 27 26 25  '$ GLUCOSE 214* 153* 185* 161* 205*  BUN 27* 20 37* 30* 23  CREATININE 0.86 0.87 1.28* 1.09* 0.99  CALCIUM 8.5* 8.6* 8.4* 8.1* 7.9*  MG  --  1.9 1.9  --   --    Liver Function Tests: No results for input(s): "AST", "ALT", "ALKPHOS", "BILITOT", "PROT", "ALBUMIN" in the last 168 hours.  No results for input(s): "LIPASE", "AMYLASE" in the last 168 hours. No results for input(s): "AMMONIA" in the last 168 hours. CBC: Recent Labs  Lab 05/16/22 0549 05/17/22 0718 05/18/22 0545 05/19/22 0526 05/20/22 0522  WBC 16.1* 14.7* 11.0* 8.0 7.9  NEUTROABS 14.2* 12.2*  --  6.0 6.2  HGB 10.9* 12.0 10.0* 9.1* 9.0*  HCT 34.3* 38.5 31.8* 28.6* 29.1*  MCV 90.3 92.8 92.4 92.3 95.4  PLT PLATELET CLUMPS NOTED ON SMEAR, UNABLE TO ESTIMATE PLATELET CLUMPS NOTED ON SMEAR, UNABLE TO ESTIMATE PLATELET CLUMPS  NOTED ON SMEAR, UNABLE TO ESTIMATE 62* PLATELET CLUMPS NOTED ON SMEAR, UNABLE TO ESTIMATE   Cardiac Enzymes: No results for input(s): "CKTOTAL", "CKMB", "CKMBINDEX", "TROPONINI" in the last 168 hours. BNP: Invalid input(s): "POCBNP" CBG: Recent Labs  Lab 05/21/22 1213 05/21/22 1621 05/21/22 2049 05/22/22 0740 05/22/22 0815  GLUCAP 155* 150* 100* 56* 81   D-Dimer No results for input(s): "DDIMER" in the last 72 hours. Hgb A1c No results for input(s): "HGBA1C" in the last 72 hours. Lipid Profile No results for input(s): "CHOL", "HDL", "LDLCALC", "TRIG", "CHOLHDL", "LDLDIRECT" in the last 72 hours. Thyroid function studies No results for input(s): "TSH", "T4TOTAL", "T3FREE", "THYROIDAB" in the last 72 hours.  Invalid input(s): "FREET3" Anemia work up No results for input(s): "VITAMINB12", "FOLATE", "FERRITIN", "TIBC", "IRON", "RETICCTPCT" in the last 72 hours. Urinalysis    Component Value Date/Time   COLORURINE YELLOW 05/13/2022 0033   APPEARANCEUR CLEAR 05/13/2022 0033   LABSPEC 1.022 05/13/2022 0033   PHURINE 6.0 05/13/2022 0033   GLUCOSEU >=500 (A) 05/13/2022 0033   HGBUR SMALL (A) 05/13/2022 0033   BILIRUBINUR NEGATIVE 05/13/2022 0033   KETONESUR NEGATIVE 05/13/2022 0033   PROTEINUR NEGATIVE 05/13/2022 0033   UROBILINOGEN 0.2 12/05/2010 1740   NITRITE NEGATIVE 05/13/2022 0033   LEUKOCYTESUR SMALL (A) 05/13/2022 0033   Sepsis Labs Recent Labs  Lab 05/17/22 0718 05/18/22 0545 05/19/22 0526 05/20/22 0522  WBC 14.7* 11.0* 8.0 7.9   Microbiology Recent Results (from the past 240 hour(s))  Urine Culture     Status: Abnormal   Collection Time: 05/13/22  9:33 PM   Specimen: In/Out Cath Urine  Result Value Ref Range Status   Specimen Description   Final    IN/OUT CATH URINE Performed at [facility], [place] [address] [place], [place] [address]    Special Requests   Final    NONE Performed at [facility], [place] [address] [place],  [address]    Culture >=100,000 COLONIES/mL ESCHERICHIA COLI (A)  Final   Report Status 05/17/2022 FINAL  Final   Organism ID, Bacteria ESCHERICHIA COLI (A)  Final      Susceptibility   Escherichia coli - MIC*    AMPICILLIN >=32 RESISTANT Resistant  CEFAZOLIN >=64 RESISTANT Resistant     CEFEPIME <=0.12 SENSITIVE Sensitive     CEFTRIAXONE <=0.25 SENSITIVE Sensitive     CIPROFLOXACIN <=0.25 SENSITIVE Sensitive     GENTAMICIN <=1 SENSITIVE Sensitive     IMIPENEM <=0.25 SENSITIVE Sensitive     NITROFURANTOIN <=16 SENSITIVE Sensitive     TRIMETH/SULFA <=20 SENSITIVE Sensitive     AMPICILLIN/SULBACTAM >=32 RESISTANT Resistant     PIP/TAZO 8 SENSITIVE Sensitive     * >=100,000 COLONIES/mL ESCHERICHIA COLI    SIGNED:   Charlynne Cousins, MD  [facility] 05/22/2022, 10:04 AM Pager   If 7PM-7AM, please contact night-coverage [email or web address] Password [email or web address]

## 2022-05-22 NOTE — Progress Notes (Signed)
Patient transported to SNF via ambulance, called facility couple of times to give report but unable to talk to speak to the nurse. Patient's daughter Scottie notified that patient has been picked up for transported to SNF.

## 2022-05-26 DIAGNOSIS — I5032 Chronic diastolic (congestive) heart failure: Secondary | ICD-10-CM | POA: Diagnosis not present

## 2022-05-27 DIAGNOSIS — J9611 Chronic respiratory failure with hypoxia: Secondary | ICD-10-CM | POA: Diagnosis not present

## 2022-05-27 DIAGNOSIS — I5032 Chronic diastolic (congestive) heart failure: Secondary | ICD-10-CM | POA: Diagnosis not present

## 2022-05-27 DIAGNOSIS — Z741 Need for assistance with personal care: Secondary | ICD-10-CM | POA: Diagnosis not present

## 2022-05-27 DIAGNOSIS — M6259 Muscle wasting and atrophy, not elsewhere classified, multiple sites: Secondary | ICD-10-CM | POA: Diagnosis not present

## 2022-05-28 DIAGNOSIS — I5032 Chronic diastolic (congestive) heart failure: Secondary | ICD-10-CM | POA: Diagnosis not present

## 2022-06-02 DIAGNOSIS — I5032 Chronic diastolic (congestive) heart failure: Secondary | ICD-10-CM | POA: Diagnosis not present

## 2022-06-03 DIAGNOSIS — Z741 Need for assistance with personal care: Secondary | ICD-10-CM | POA: Diagnosis not present

## 2022-06-03 DIAGNOSIS — M6259 Muscle wasting and atrophy, not elsewhere classified, multiple sites: Secondary | ICD-10-CM | POA: Diagnosis not present

## 2022-06-03 DIAGNOSIS — I5032 Chronic diastolic (congestive) heart failure: Secondary | ICD-10-CM | POA: Diagnosis not present

## 2022-06-03 DIAGNOSIS — J9611 Chronic respiratory failure with hypoxia: Secondary | ICD-10-CM | POA: Diagnosis not present

## 2022-06-04 DIAGNOSIS — Z86718 Personal history of other venous thrombosis and embolism: Secondary | ICD-10-CM | POA: Diagnosis not present

## 2022-06-08 DIAGNOSIS — Z86718 Personal history of other venous thrombosis and embolism: Secondary | ICD-10-CM | POA: Diagnosis not present

## 2022-06-08 DIAGNOSIS — I5032 Chronic diastolic (congestive) heart failure: Secondary | ICD-10-CM | POA: Diagnosis not present

## 2022-06-10 DIAGNOSIS — Z741 Need for assistance with personal care: Secondary | ICD-10-CM | POA: Diagnosis not present

## 2022-06-10 DIAGNOSIS — E1122 Type 2 diabetes mellitus with diabetic chronic kidney disease: Secondary | ICD-10-CM | POA: Diagnosis not present

## 2022-06-10 DIAGNOSIS — Z8719 Personal history of other diseases of the digestive system: Secondary | ICD-10-CM | POA: Diagnosis not present

## 2022-06-10 DIAGNOSIS — J449 Chronic obstructive pulmonary disease, unspecified: Secondary | ICD-10-CM | POA: Diagnosis not present

## 2022-06-10 DIAGNOSIS — H409 Unspecified glaucoma: Secondary | ICD-10-CM | POA: Diagnosis not present

## 2022-06-10 DIAGNOSIS — Z7984 Long term (current) use of oral hypoglycemic drugs: Secondary | ICD-10-CM | POA: Diagnosis not present

## 2022-06-10 DIAGNOSIS — I5032 Chronic diastolic (congestive) heart failure: Secondary | ICD-10-CM | POA: Diagnosis not present

## 2022-06-10 DIAGNOSIS — N1832 Chronic kidney disease, stage 3b: Secondary | ICD-10-CM | POA: Diagnosis not present

## 2022-06-10 DIAGNOSIS — U071 COVID-19: Secondary | ICD-10-CM | POA: Diagnosis not present

## 2022-06-10 DIAGNOSIS — Z9981 Dependence on supplemental oxygen: Secondary | ICD-10-CM | POA: Diagnosis not present

## 2022-06-10 DIAGNOSIS — Z7901 Long term (current) use of anticoagulants: Secondary | ICD-10-CM | POA: Diagnosis not present

## 2022-06-10 DIAGNOSIS — Z9181 History of falling: Secondary | ICD-10-CM | POA: Diagnosis not present

## 2022-06-10 DIAGNOSIS — N179 Acute kidney failure, unspecified: Secondary | ICD-10-CM | POA: Diagnosis not present

## 2022-06-10 DIAGNOSIS — M6259 Muscle wasting and atrophy, not elsewhere classified, multiple sites: Secondary | ICD-10-CM | POA: Diagnosis not present

## 2022-06-10 DIAGNOSIS — E871 Hypo-osmolality and hyponatremia: Secondary | ICD-10-CM | POA: Diagnosis not present

## 2022-06-10 DIAGNOSIS — I130 Hypertensive heart and chronic kidney disease with heart failure and stage 1 through stage 4 chronic kidney disease, or unspecified chronic kidney disease: Secondary | ICD-10-CM | POA: Diagnosis not present

## 2022-06-10 DIAGNOSIS — J9611 Chronic respiratory failure with hypoxia: Secondary | ICD-10-CM | POA: Diagnosis not present

## 2022-06-10 DIAGNOSIS — E785 Hyperlipidemia, unspecified: Secondary | ICD-10-CM | POA: Diagnosis not present

## 2022-06-10 DIAGNOSIS — K589 Irritable bowel syndrome without diarrhea: Secondary | ICD-10-CM | POA: Diagnosis not present

## 2022-06-10 DIAGNOSIS — I509 Heart failure, unspecified: Secondary | ICD-10-CM | POA: Diagnosis not present

## 2022-06-10 DIAGNOSIS — Z9049 Acquired absence of other specified parts of digestive tract: Secondary | ICD-10-CM | POA: Diagnosis not present

## 2022-06-10 DIAGNOSIS — Z7985 Long-term (current) use of injectable non-insulin antidiabetic drugs: Secondary | ICD-10-CM | POA: Diagnosis not present

## 2022-06-11 DIAGNOSIS — I5032 Chronic diastolic (congestive) heart failure: Secondary | ICD-10-CM | POA: Diagnosis not present

## 2022-06-11 DIAGNOSIS — Z86718 Personal history of other venous thrombosis and embolism: Secondary | ICD-10-CM | POA: Diagnosis not present

## 2022-06-16 DIAGNOSIS — Z86718 Personal history of other venous thrombosis and embolism: Secondary | ICD-10-CM | POA: Diagnosis not present

## 2022-06-16 DIAGNOSIS — I5032 Chronic diastolic (congestive) heart failure: Secondary | ICD-10-CM | POA: Diagnosis not present

## 2022-06-19 DIAGNOSIS — N179 Acute kidney failure, unspecified: Secondary | ICD-10-CM | POA: Diagnosis not present

## 2022-06-19 DIAGNOSIS — J449 Chronic obstructive pulmonary disease, unspecified: Secondary | ICD-10-CM | POA: Diagnosis not present

## 2022-06-19 DIAGNOSIS — U071 COVID-19: Secondary | ICD-10-CM | POA: Diagnosis not present

## 2022-06-19 DIAGNOSIS — I509 Heart failure, unspecified: Secondary | ICD-10-CM | POA: Diagnosis not present

## 2022-06-19 DIAGNOSIS — J9611 Chronic respiratory failure with hypoxia: Secondary | ICD-10-CM | POA: Diagnosis not present

## 2022-06-19 DIAGNOSIS — I130 Hypertensive heart and chronic kidney disease with heart failure and stage 1 through stage 4 chronic kidney disease, or unspecified chronic kidney disease: Secondary | ICD-10-CM | POA: Diagnosis not present

## 2022-06-22 ENCOUNTER — Encounter (HOSPITAL_COMMUNITY): Payer: Self-pay | Admitting: Internal Medicine

## 2022-06-22 ENCOUNTER — Emergency Department (HOSPITAL_COMMUNITY): Payer: Medicare Other

## 2022-06-22 ENCOUNTER — Inpatient Hospital Stay (HOSPITAL_COMMUNITY)
Admission: EM | Admit: 2022-06-22 | Discharge: 2022-06-26 | DRG: 190 | Disposition: A | Discharge status: Home Health | Payer: Medicare Other | Attending: Family Medicine | Admitting: Family Medicine

## 2022-06-22 DIAGNOSIS — Z7985 Long-term (current) use of injectable non-insulin antidiabetic drugs: Secondary | ICD-10-CM

## 2022-06-22 DIAGNOSIS — N179 Acute kidney failure, unspecified: Secondary | ICD-10-CM | POA: Diagnosis present

## 2022-06-22 DIAGNOSIS — Z86718 Personal history of other venous thrombosis and embolism: Secondary | ICD-10-CM | POA: Diagnosis not present

## 2022-06-22 DIAGNOSIS — I5032 Chronic diastolic (congestive) heart failure: Secondary | ICD-10-CM | POA: Diagnosis not present

## 2022-06-22 DIAGNOSIS — Z881 Allergy status to other antibiotic agents status: Secondary | ICD-10-CM

## 2022-06-22 DIAGNOSIS — Z7901 Long term (current) use of anticoagulants: Secondary | ICD-10-CM

## 2022-06-22 DIAGNOSIS — E0965 Drug or chemical induced diabetes mellitus with hyperglycemia: Secondary | ICD-10-CM | POA: Diagnosis present

## 2022-06-22 DIAGNOSIS — L89152 Pressure ulcer of sacral region, stage 2: Secondary | ICD-10-CM | POA: Diagnosis present

## 2022-06-22 DIAGNOSIS — U071 COVID-19: Secondary | ICD-10-CM | POA: Diagnosis not present

## 2022-06-22 DIAGNOSIS — N1831 Chronic kidney disease, stage 3a: Secondary | ICD-10-CM | POA: Diagnosis present

## 2022-06-22 DIAGNOSIS — J441 Chronic obstructive pulmonary disease with (acute) exacerbation: Secondary | ICD-10-CM | POA: Diagnosis not present

## 2022-06-22 DIAGNOSIS — Z9981 Dependence on supplemental oxygen: Secondary | ICD-10-CM | POA: Diagnosis not present

## 2022-06-22 DIAGNOSIS — T380X5A Adverse effect of glucocorticoids and synthetic analogues, initial encounter: Secondary | ICD-10-CM | POA: Diagnosis not present

## 2022-06-22 DIAGNOSIS — J9621 Acute and chronic respiratory failure with hypoxia: Secondary | ICD-10-CM | POA: Diagnosis present

## 2022-06-22 DIAGNOSIS — Z823 Family history of stroke: Secondary | ICD-10-CM

## 2022-06-22 DIAGNOSIS — Z1152 Encounter for screening for COVID-19: Secondary | ICD-10-CM | POA: Diagnosis not present

## 2022-06-22 DIAGNOSIS — R000 Tachycardia, unspecified: Secondary | ICD-10-CM | POA: Diagnosis not present

## 2022-06-22 DIAGNOSIS — E785 Hyperlipidemia, unspecified: Secondary | ICD-10-CM | POA: Diagnosis present

## 2022-06-22 DIAGNOSIS — Z79899 Other long term (current) drug therapy: Secondary | ICD-10-CM

## 2022-06-22 DIAGNOSIS — Z8616 Personal history of COVID-19: Secondary | ICD-10-CM

## 2022-06-22 DIAGNOSIS — Z7984 Long term (current) use of oral hypoglycemic drugs: Secondary | ICD-10-CM | POA: Diagnosis not present

## 2022-06-22 DIAGNOSIS — R739 Hyperglycemia, unspecified: Secondary | ICD-10-CM | POA: Diagnosis not present

## 2022-06-22 DIAGNOSIS — E0922 Drug or chemical induced diabetes mellitus with diabetic chronic kidney disease: Secondary | ICD-10-CM | POA: Diagnosis present

## 2022-06-22 DIAGNOSIS — E09641 Drug or chemical induced diabetes mellitus with hypoglycemia with coma: Secondary | ICD-10-CM | POA: Diagnosis not present

## 2022-06-22 DIAGNOSIS — R059 Cough, unspecified: Secondary | ICD-10-CM | POA: Diagnosis not present

## 2022-06-22 DIAGNOSIS — Z885 Allergy status to narcotic agent status: Secondary | ICD-10-CM

## 2022-06-22 DIAGNOSIS — H409 Unspecified glaucoma: Secondary | ICD-10-CM | POA: Diagnosis not present

## 2022-06-22 DIAGNOSIS — Z888 Allergy status to other drugs, medicaments and biological substances status: Secondary | ICD-10-CM

## 2022-06-22 DIAGNOSIS — I130 Hypertensive heart and chronic kidney disease with heart failure and stage 1 through stage 4 chronic kidney disease, or unspecified chronic kidney disease: Secondary | ICD-10-CM | POA: Diagnosis present

## 2022-06-22 DIAGNOSIS — R110 Nausea: Secondary | ICD-10-CM | POA: Diagnosis not present

## 2022-06-22 LAB — CBC WITH DIFFERENTIAL/PLATELET
Abs Immature Granulocytes: 0.08 10*3/uL — ABNORMAL HIGH (ref 0.00–0.07)
Basophils Absolute: 0.1 10*3/uL (ref 0.0–0.1)
Basophils Relative: 1 %
Eosinophils Absolute: 0 10*3/uL (ref 0.0–0.5)
Eosinophils Relative: 0 %
HCT: 36 % (ref 36.0–46.0)
Hemoglobin: 10.9 g/dL — ABNORMAL LOW (ref 12.0–15.0)
Immature Granulocytes: 1 %
Lymphocytes Relative: 10 %
Lymphs Abs: 1 10*3/uL (ref 0.7–4.0)
MCH: 28.9 pg (ref 26.0–34.0)
MCHC: 30.3 g/dL (ref 30.0–36.0)
MCV: 95.5 fL (ref 80.0–100.0)
Monocytes Absolute: 0.5 10*3/uL (ref 0.1–1.0)
Monocytes Relative: 5 %
Neutro Abs: 8.7 10*3/uL — ABNORMAL HIGH (ref 1.7–7.7)
Neutrophils Relative %: 83 %
Platelets: UNDETERMINED 10*3/uL (ref 150–400)
RBC: 3.77 MIL/uL — ABNORMAL LOW (ref 3.87–5.11)
RDW: 15.5 % (ref 11.5–15.5)
WBC: 10.5 10*3/uL (ref 4.0–10.5)
nRBC: 0 % (ref 0.0–0.2)

## 2022-06-22 LAB — TROPONIN I (HIGH SENSITIVITY)
Troponin I (High Sensitivity): 17 ng/L (ref <18)
Troponin I (High Sensitivity): 17 ng/L (ref <18)

## 2022-06-22 LAB — COMPREHENSIVE METABOLIC PANEL
ALT: 12 U/L (ref 0–44)
AST: 23 U/L (ref 15–41)
Albumin: 3.2 g/dL — ABNORMAL LOW (ref 3.5–5.0)
Alkaline Phosphatase: 74 U/L (ref 38–126)
Anion gap: 11 (ref 5–15)
BUN: 32 mg/dL — ABNORMAL HIGH (ref 8–23)
CO2: 27 mmol/L (ref 22–32)
Calcium: 8.6 mg/dL — ABNORMAL LOW (ref 8.9–10.3)
Chloride: 97 mmol/L — ABNORMAL LOW (ref 98–111)
Creatinine, Ser: 0.86 mg/dL (ref 0.44–1.00)
GFR, Estimated: >60 mL/min (ref >60)
Glucose, Bld: 217 mg/dL — ABNORMAL HIGH (ref 70–99)
Potassium: 3.9 mmol/L (ref 3.5–5.1)
Sodium: 135 mmol/L (ref 135–145)
Total Bilirubin: 0.6 mg/dL (ref 0.3–1.2)
Total Protein: 7.2 g/dL (ref 6.5–8.1)

## 2022-06-22 LAB — BLOOD GAS, VENOUS
Acid-Base Excess: 4.8 mmol/L — ABNORMAL HIGH (ref 0.0–2.0)
Bicarbonate: 30.4 mmol/L — ABNORMAL HIGH (ref 20.0–28.0)
O2 Saturation: 68.2 %
Patient temperature: 37
pCO2, Ven: 49 mmHg (ref 44–60)
pH, Ven: 7.4 (ref 7.25–7.43)
pO2, Ven: 39 mmHg (ref 32–45)

## 2022-06-22 LAB — LACTIC ACID, PLASMA
Lactic Acid, Venous: 1.6 mmol/L (ref 0.5–1.9)
Lactic Acid, Venous: 1.2 mmol/L (ref 0.5–1.9)

## 2022-06-22 LAB — GLUCOSE, CAPILLARY: Glucose-Capillary: 290 mg/dL — ABNORMAL HIGH (ref 70–99)

## 2022-06-22 LAB — RESP PANEL BY RT-PCR (RSV, FLU A&B, COVID)  RVPGX2
Influenza A by PCR: NEGATIVE
Influenza B by PCR: NEGATIVE
Resp Syncytial Virus by PCR: NEGATIVE
SARS Coronavirus 2 by RT PCR: NEGATIVE

## 2022-06-22 LAB — BRAIN NATRIURETIC PEPTIDE: B Natriuretic Peptide: 305.9 pg/mL — ABNORMAL HIGH (ref 0.0–100.0)

## 2022-06-22 MED ORDER — IPRATROPIUM-ALBUTEROL 0.5-2.5 (3) MG/3ML IN SOLN
3.0000 mL | Freq: Once | RESPIRATORY_TRACT | Status: AC
  Administered 2022-06-22: 3 mL via RESPIRATORY_TRACT
  Filled 2022-06-22: qty 3

## 2022-06-22 MED ORDER — LEVALBUTEROL HCL 0.63 MG/3ML IN NEBU
0.6300 mg | INHALATION_SOLUTION | Freq: Four times a day (QID) | RESPIRATORY_TRACT | Status: AC
  Administered 2022-06-22 – 2022-06-24 (×6): 0.63 mg via RESPIRATORY_TRACT
  Filled 2022-06-22 (×6): qty 3

## 2022-06-22 MED ORDER — LEVOFLOXACIN IN D5W 750 MG/150ML IV SOLN
750.0000 mg | INTRAVENOUS | Status: DC
  Administered 2022-06-22: 750 mg via INTRAVENOUS
  Filled 2022-06-22: qty 150

## 2022-06-22 MED ORDER — ENOXAPARIN SODIUM 40 MG/0.4ML IJ SOSY: 40.0000 mg | PREFILLED_SYRINGE | INTRAMUSCULAR | Status: DC

## 2022-06-22 MED ORDER — AZITHROMYCIN 250 MG PO TABS
500.0000 mg | ORAL_TABLET | Freq: Every day | ORAL | Status: DC
  Administered 2022-06-23 – 2022-06-25 (×3): 500 mg via ORAL
  Administered 2022-06-26: 250 mg via ORAL
  Filled 2022-06-22 (×5): qty 2

## 2022-06-22 MED ORDER — RIVAROXABAN 20 MG PO TABS
20.0000 mg | ORAL_TABLET | Freq: Every day | ORAL | Status: DC
  Administered 2022-06-23 – 2022-06-26 (×4): 20 mg via ORAL
  Filled 2022-06-22 (×4): qty 1

## 2022-06-22 MED ORDER — SODIUM CHLORIDE 0.9 % IV SOLN: 500.0000 mg | INTRAVENOUS | Status: DC

## 2022-06-22 MED ORDER — INSULIN ASPART 100 UNIT/ML IJ SOLN
6.0000 [IU] | Freq: Three times a day (TID) | INTRAMUSCULAR | Status: DC
  Administered 2022-06-23 – 2022-06-24 (×5): 6 [IU] via SUBCUTANEOUS
  Filled 2022-06-22: qty 0.06

## 2022-06-22 MED ORDER — AZITHROMYCIN 250 MG PO TABS: 500.0000 mg | ORAL_TABLET | Freq: Every day | ORAL | Status: DC

## 2022-06-22 MED ORDER — SODIUM CHLORIDE 0.9 % IV BOLUS
500.0000 mL | Freq: Once | INTRAVENOUS | Status: AC
  Administered 2022-06-22: 500 mL via INTRAVENOUS

## 2022-06-22 MED ORDER — INSULIN ASPART 100 UNIT/ML IJ SOLN
0.0000 [IU] | Freq: Three times a day (TID) | INTRAMUSCULAR | Status: DC
  Administered 2022-06-23: 11 [IU] via SUBCUTANEOUS
  Administered 2022-06-23: 15 [IU] via SUBCUTANEOUS
  Administered 2022-06-23: 11 [IU] via SUBCUTANEOUS
  Administered 2022-06-24 – 2022-06-25 (×4): 15 [IU] via SUBCUTANEOUS
  Administered 2022-06-26: 4 [IU] via SUBCUTANEOUS
  Filled 2022-06-22: qty 0.2

## 2022-06-22 MED ORDER — BRIMONIDINE TARTRATE 0.2 % OP SOLN
1.0000 [drp] | Freq: Three times a day (TID) | OPHTHALMIC | Status: DC
  Administered 2022-06-22 – 2022-06-25 (×10): 1 [drp] via OPHTHALMIC
  Filled 2022-06-22: qty 5

## 2022-06-22 MED ORDER — NETARSUDIL-LATANOPROST 0.02-0.005 % OP SOLN: 1.0000 [drp] | Freq: Every day | OPHTHALMIC | Status: DC

## 2022-06-22 MED ORDER — METHYLPREDNISOLONE SODIUM SUCC 40 MG IJ SOLR
40.0000 mg | Freq: Two times a day (BID) | INTRAMUSCULAR | Status: DC
  Administered 2022-06-23 – 2022-06-25 (×5): 40 mg via INTRAVENOUS
  Filled 2022-06-22 (×5): qty 1

## 2022-06-22 MED ORDER — BRINZOLAMIDE 1 % OP SUSP
1.0000 [drp] | Freq: Three times a day (TID) | OPHTHALMIC | Status: DC
  Administered 2022-06-22 – 2022-06-25 (×10): 1 [drp] via OPHTHALMIC
  Filled 2022-06-22: qty 10

## 2022-06-22 MED ORDER — UMECLIDINIUM-VILANTEROL 62.5-25 MCG/ACT IN AEPB
1.0000 | INHALATION_SPRAY | Freq: Every day | RESPIRATORY_TRACT | Status: DC
  Administered 2022-06-22 – 2022-06-26 (×5): 1 via RESPIRATORY_TRACT
  Filled 2022-06-22: qty 14

## 2022-06-22 MED ORDER — METHYLPREDNISOLONE SODIUM SUCC 125 MG IJ SOLR
125.0000 mg | Freq: Once | INTRAMUSCULAR | Status: AC
  Administered 2022-06-22: 125 mg via INTRAVENOUS
  Filled 2022-06-22: qty 2

## 2022-06-22 NOTE — ED Provider Notes (Signed)
[facility] Provider Note  CSN: [id number] Arrival date & time: 06/22/22 1258  Chief Complaint(s) Shortness of Breath  HPI [name] is a 89 y.o. female with history of CHF with COPD, diabetes, hyperlipidemia, hypertension presenting to the emergency department with shortness of breath.  Patient reports shortness of breath this morning.  She also reports cough since last week.  Patient reports mild dyspnea until today when it became very much worse.  Paramedics were called and took her to the emergency department.  No fevers, chills.  Reports provide cough is productive of yellow sputum.  Reports mild chest pain with coughing but otherwise no chest pain or pleuritic pain.  She reports she does not use any treatments for COPD at home.  She uses oxygen but only at night.   Past Medical History Past Medical History:  Diagnosis Date   Cellulitis and abscess of left leg    CHF (congestive heart failure) ([place])    COPD (chronic obstructive pulmonary disease) ([place])    Diabetes mellitus    Glaucoma    Hyperlipidemia    Hypertension    Patient Active Problem List   Diagnosis Date Noted   Rectal bleeding 05/17/2022   UTI (urinary tract infection) 05/16/2022   Pressure injury of skin 05/16/2022   Acute bronchitis 05/15/2022   Elevated lactic acid level 05/14/2022   Hyperkalemia 05/14/2022   Hyperosmolar hyperglycemic state (HHS) ([place]) 05/13/2022   History of DVT (deep vein thrombosis) 05/13/2022   COVID-19 virus infection 05/04/2022   COPD exacerbation ([place]) 05/04/2022   AKI (acute kidney injury) ([place]) 05/03/2022   Hyponatremia 05/03/2022   Elevated LFTs 05/03/2022   Chronic hypoxic respiratory failure ([place]) 05/03/2022   Anxiety 12/18/2020   Arthritis 12/18/2020   Aortic valve disorder 12/18/2020   Chronic obstructive pulmonary disease, unspecified ([place]) 12/18/2020   Diabetic neuropathy ([place]) 12/18/2020   Diabetic renal disease ([place])  12/18/2020   Heart failure ([place]) 12/18/2020   Hyperlipidemia 12/18/2020   Mitral regurgitation 12/18/2020   Morbid obesity ([place]) 12/18/2020   Obstructive sleep apnea syndrome 12/18/2020   Vitamin B12 deficiency (non anemic) 12/18/2020   Other long term (current) drug therapy 12/18/2020   CAP (community acquired pneumonia) 12/07/2019   Cellulitis 02/26/2019   Essential hypertension 11/03/2017   Sepsis ([place]) 11/03/2017   Type II diabetes mellitus with renal manifestations ([place]) 11/03/2017   Cellulitis of right leg 11/03/2017   Flank pain 11/03/2017   Chronic diastolic CHF (congestive heart failure) ([place]) 03/19/2017   Diabetes mellitus ([place]) 03/19/2017   Obesity, diabetes, and hypertension syndrome ([place]) 03/19/2017   CKD (chronic kidney disease) stage 3, GFR 30-59 ml/min (HCC) 03/19/2017   Syncope 10/24/2016   Pulmonary hypertension ([place]) 12/07/2015   Varicose veins of leg with complications [id number]   Varicose veins of left lower extremity with complications [id number]   Varicose veins of lower extremities with other complications [id number]   Lipoma of back 07/19/2012   Home Medication(s) Prior to Admission medications   Medication Sig Start Date End Date Taking? Authorizing Provider  acetaminophen (TYLENOL) 325 MG tablet Take 2 tablets (650 mg total) by mouth every 6 (six) hours as needed for mild pain (or Fever >/= 101). 05/07/22   Sheikh, Omair Latif, DO  amLODipine (NORVASC) 10 MG tablet TAKE 1 TABLET(10 MG) BY MOUTH DAILY 05/19/22   Adrian Prows, MD  Brinzolamide-Brimonidine [facility]) 1-0.2 % SUSP Apply 2 drops to eye daily.    [provider]  glipiZIDE (GLUCOTROL)  5 MG tablet Take 1 tablet (5 mg total) by mouth 2 (two) times daily. 05/21/22 05/21/23  Charlynne Cousins, MD  isosorbide mononitrate (IMDUR) 60 MG 24 hr tablet TAKE 1 TABLET DAILY Patient taking differently: Take 60 mg by mouth daily. 09/15/21   Adrian Prows, MD  liraglutide (VICTOZA) 18 MG/3ML SOPN Inject  0.6 mg into the skin daily.     [provider]  loratadine (CLARITIN) 10 MG tablet Take 1 tablet (10 mg total) by mouth daily. 05/08/22   Raiford Noble Latif, DO  metFORMIN (GLUCOPHAGE) 1000 MG tablet Take 0.5 tablets (500 mg total) by mouth daily. 10/27/16   [name], MD  pantoprazole (PROTONIX) 40 MG tablet Take 1 tablet (40 mg total) by mouth daily. 05/08/22   Raiford Noble Latif, DO  Rivaroxaban (XARELTO) 15 MG TABS tablet Take 1 tablet (15 mg total) by mouth 2 (two) times daily. 05/21/22   Charlynne Cousins, MD  rivaroxaban (XARELTO) 20 MG TABS tablet Take 1 tablet (20 mg total) by mouth daily with supper. 06/03/22   Charlynne Cousins, MD  ROCKLATAN 0.02-0.005 % SOLN Place 1 drop into both eyes at bedtime. 11/24/19   [provider]  zinc sulfate 220 (50 Zn) MG capsule Take 1 capsule (220 mg total) by mouth daily. Patient not taking: Reported on 05/14/2022 05/08/22   Kerney Elbe, DO                                                                                                                                    Past Surgical History Past Surgical History:  Procedure Laterality Date   BACK SURGERY     CARDIAC CATHETERIZATION N/A 12/09/2015   Procedure: Right Heart Cath;  Surgeon: Adrian Prows, MD;  Location: [facility];  Service: Cardiovascular;  Laterality: N/A;   CHOLECYSTECTOMY  2000   HERNIA REPAIR  2007   rupture disk  1970"s   SHOULDER ARTHROSCOPY WITH ROTATOR CUFF REPAIR  1999   rt shoulder   Family History Family History  Problem Relation Age of Onset   Stroke Mother     Social History Social History   Tobacco Use   Smoking status: Never   Smokeless tobacco: Never  Vaping Use   Vaping Use: Never used  Substance Use Topics   Alcohol use: No   Drug use: No   Allergies Cephalexin, Micardis [telmisartan], Atorvastatin, Buspirone, Canagliflozin, Doxycycline hyclate, Lipitor [atorvastatin calcium], Tramadol, and Tramadol hcl  Review of  Systems Review of Systems  All other systems reviewed and are negative.   Physical Exam Vital Signs  I have reviewed the triage vital signs BP 104/81 (BP Location: Right Arm)   Pulse (!) 117   Temp 97.9 F (36.6 C) (Oral)   Resp 20   SpO2 96%  Physical Exam Vitals and nursing note reviewed.  Constitutional:      General: She is not in acute  distress.    Appearance: She is well-developed.  HENT:     Head: Normocephalic and atraumatic.     Mouth/Throat:     Mouth: Mucous membranes are moist.  Eyes:     Pupils: Pupils are equal, round, and reactive to light.  Cardiovascular:     Rate and Rhythm: Normal rate and regular rhythm.     Heart sounds: No murmur heard. Pulmonary:     Effort: Tachypnea and respiratory distress present.     Breath sounds: Examination of the right-upper field reveals wheezing. Examination of the left-upper field reveals wheezing. Examination of the right-middle field reveals wheezing. Examination of the left-middle field reveals wheezing. Examination of the right-lower field reveals wheezing. Examination of the left-lower field reveals wheezing. Wheezing present.  Abdominal:     General: Abdomen is flat.     Palpations: Abdomen is soft.     Tenderness: There is no abdominal tenderness.  Musculoskeletal:        General: No tenderness.     Right lower leg: No edema.     Left lower leg: No edema.  Skin:    General: Skin is warm and dry.  Neurological:     General: No focal deficit present.     Mental Status: She is alert. Mental status is at baseline.  Psychiatric:        Mood and Affect: Mood normal.        Behavior: Behavior normal.     ED Results and Treatments Labs (all labs ordered are listed, but only abnormal results are displayed) Labs Reviewed  COMPREHENSIVE METABOLIC PANEL - Abnormal; Notable for the following components:      Result Value   Chloride 97 (*)    Glucose, Bld 217 (*)    BUN 32 (*)    Calcium 8.6 (*)    Albumin 3.2  (*)    All other components within normal limits  CBC WITH DIFFERENTIAL/PLATELET - Abnormal; Notable for the following components:   RBC 3.77 (*)    Hemoglobin 10.9 (*)    Neutro Abs 8.7 (*)    Abs Immature Granulocytes 0.08 (*)    All other components within normal limits  BRAIN NATRIURETIC PEPTIDE - Abnormal; Notable for the following components:   B Natriuretic Peptide 305.9 (*)    All other components within normal limits  BLOOD GAS, VENOUS - Abnormal; Notable for the following components:   Bicarbonate 30.4 (*)    Acid-Base Excess 4.8 (*)    All other components within normal limits  RESP PANEL BY RT-PCR (RSV, FLU A&B, COVID)  RVPGX2  CULTURE, BLOOD (ROUTINE X 2)  CULTURE, BLOOD (ROUTINE X 2)  LACTIC ACID, PLASMA  LACTIC ACID, PLASMA  TROPONIN I (HIGH SENSITIVITY)  TROPONIN I (HIGH SENSITIVITY)                                                                                                                          Radiology DG Chest [facility] [place]  1 View  Result Date: 06/22/2022 CLINICAL DATA:  COVID positive 1 month ago, cough EXAM: PORTABLE CHEST 1 VIEW COMPARISON:  05/18/2022 FINDINGS: Single frontal view of the chest demonstrates a stable cardiac silhouette. There has been near complete resolution of the retrocardiac airspace disease seen on prior study. Chronic scarring throughout the lungs again noted. No effusion or pneumothorax. No acute bony abnormalities. IMPRESSION: 1. Near complete resolution of the retrocardiac airspace disease seen on prior exam. 2. Chronic background scarring.  No new process. Electronically Signed   By: Randa Ngo M.D.   On: 06/22/2022 15:40    Pertinent labs & imaging results that were available during my care of the patient were reviewed by me and considered in my medical decision making (see MDM for details).  Medications Ordered in ED Medications  levofloxacin (LEVAQUIN) IVPB 750 mg (750 mg Intravenous New Bag/Given 06/22/22 1615)   ipratropium-albuterol (DUONEB) 0.5-2.5 (3) MG/3ML nebulizer solution 3 mL (3 mLs Nebulization Given 06/22/22 1359)  methylPREDNISolone sodium succinate (SOLU-MEDROL) 125 mg/2 mL injection 125 mg (125 mg Intravenous Given 06/22/22 1400)  sodium chloride 0.9 % bolus 500 mL (0 mLs Intravenous Stopped 06/22/22 1638)  ipratropium-albuterol (DUONEB) 0.5-2.5 (3) MG/3ML nebulizer solution 3 mL (3 mLs Nebulization Given 06/22/22 1613)                                                                                                                                     Procedures Procedures  (including critical care time)  Medical Decision Making / ED Course   MDM:  89 year old female presenting to the emergency department with cough.  On exam, patient with diffuse wheezing.  Suspect COPD exacerbation.  Chest x-ray without evidence of underlying pneumonia.  Patient received breathing treatment with some improvement but remains hypoxic and requiring 4 L of oxygen.  Will cover for COPD exacerbation with Levaquin.  Discussed with the hospitalist will admit.  Patient does not appear volume overloaded and chest x-ray not concerning for pulmonary edema.  Doubt PE, patient compliant with Xarelto.  Clinical Course as of 06/22/22 1703  Mon Jun 22, 2022  1616 Delta troponin is negative. [WS]  [id number] F with increased sob and cough, treated as copd exacerbation with levaquin, steroids, duonebs. Now on new 4L [place].  [VB]  1652 Dr Rodell Perna discussed patient's admission with hospitalist. [VB]    Clinical Course User Index [VB] Elgie Congo, MD [WS] Cristie Hem, MD     Additional history obtained: -Additional history obtained from family -External records from outside source obtained and reviewed including: Chart review including previous notes, labs, imaging, consultation notes including recent d/c summary   Lab Tests: -I ordered, reviewed, and interpreted labs.   The pertinent results  include:   Labs Reviewed  COMPREHENSIVE METABOLIC PANEL - Abnormal; Notable for the following components:      Result Value   Chloride 97 (*)    Glucose,  Bld 217 (*)    BUN 32 (*)    Calcium 8.6 (*)    Albumin 3.2 (*)    All other components within normal limits  CBC WITH DIFFERENTIAL/PLATELET - Abnormal; Notable for the following components:   RBC 3.77 (*)    Hemoglobin 10.9 (*)    Neutro Abs 8.7 (*)    Abs Immature Granulocytes 0.08 (*)    All other components within normal limits  BRAIN NATRIURETIC PEPTIDE - Abnormal; Notable for the following components:   B Natriuretic Peptide 305.9 (*)    All other components within normal limits  BLOOD GAS, VENOUS - Abnormal; Notable for the following components:   Bicarbonate 30.4 (*)    Acid-Base Excess 4.8 (*)    All other components within normal limits  RESP PANEL BY RT-PCR (RSV, FLU A&B, COVID)  RVPGX2  CULTURE, BLOOD (ROUTINE X 2)  CULTURE, BLOOD (ROUTINE X 2)  LACTIC ACID, PLASMA  LACTIC ACID, PLASMA  TROPONIN I (HIGH SENSITIVITY)  TROPONIN I (HIGH SENSITIVITY)    Notable for elevated BNP although pt appears euvolemic  EKG   EKG Interpretation  Date/Time:  Monday June 22 2022 13:08:30 EST Ventricular Rate:  109 PR Interval:  203 QRS Duration: 81 QT Interval:  353 QTC Calculation: 476 R Axis:   -53 Text Interpretation: Sinus tachycardia with irregular rate Left ventricular hypertrophy Inferior infarct, old Anterior Q waves, possibly due to LVH Baseline wander in lead(s) V2 Confirmed by Garnette Gunner [phone]) on 06/22/2022 4:44:17 PM         Imaging Studies ordered: I ordered imaging studies including CXR On my interpretation imaging demonstrates clear lungs I independently visualized and interpreted imaging. I agree with the radiologist interpretation   Medicines ordered and prescription drug management: Meds ordered this encounter  Medications   ipratropium-albuterol (DUONEB) 0.5-2.5 (3) MG/3ML  nebulizer solution 3 mL   methylPREDNISolone sodium succinate (SOLU-MEDROL) 125 mg/2 mL injection 125 mg    IV methylprednisolone will be converted to either a q12h or q24h frequency with the same total daily dose (TDD).  Ordered Dose: 1 to 125 mg TDD; convert to: TDD q24h.  Ordered Dose: 126 to 250 mg TDD; convert to: TDD div q12h.  Ordered Dose: >250 mg TDD; DAW.   sodium chloride 0.9 % bolus 500 mL   ipratropium-albuterol (DUONEB) 0.5-2.5 (3) MG/3ML nebulizer solution 3 mL   levofloxacin (LEVAQUIN) IVPB 750 mg    Order Specific Question:   Antibiotic Indication:    Answer:   CAP    -I have reviewed the patients home medicines and have made adjustments as needed   Consultations Obtained: I requested consultation with the hospitalist,  and discussed lab and imaging findings as well as pertinent plan - they recommend: admission   Cardiac Monitoring: The patient was maintained on a cardiac monitor.  I personally viewed and interpreted the cardiac monitored which showed an underlying rhythm of: NSR  Social Determinants of Health:  Diagnosis or treatment significantly limited by social determinants of health: former smoker   Reevaluation: After the interventions noted above, I reevaluated the patient and found that they have improved  Co morbidities that complicate the patient evaluation  Past Medical History:  Diagnosis Date   Cellulitis and abscess of left leg    CHF (congestive heart failure) (HCC)    COPD (chronic obstructive pulmonary disease) ([place])    Diabetes mellitus    Glaucoma    Hyperlipidemia    Hypertension  Dispostion: Disposition decision including need for hospitalization was considered, and patient admitted to the hospital.    Final Clinical Impression(s) / ED Diagnoses Final diagnoses:  COPD exacerbation ([place])     This chart was dictated using voice recognition software.  Despite best efforts to proofread,  errors can occur which can change  the documentation meaning.    Cristie Hem, MD 06/22/22 [phone]

## 2022-06-22 NOTE — ED Triage Notes (Incomplete)
Pt BIBA from home with c/o nausea, SOB with productive cough, low-grade fever. Uses [place] when sleeping only. Takes metformin. Hx of COPD. Possible COVID s/sx.   BP 124/70 HR 138 RR 32 O2 93% 2L End tidal 44 CBG 265  500 NS given by EMS

## 2022-06-22 NOTE — H&P (Signed)
History and Physical    [name] [id number] DOB: Jul 05, 1930 DOA: 06/22/2022  PCP: [name], DO Patient coming from: Home  Chief Complaint: Cough and shortness of breath  HPI: [name] is a 89 y.o. female with medical history significant of COPD uses 4 L of oxygen at night to sleep, bilateral lower extremity DVT, diastolic heart failure, uncontrolled diabetes, hypertension admitted with shortness of breath and cough for the last 1 week which was progressively getting worse.  She reports she is coughing up thick yellow phlegm.  She normally does not use oxygen during the day however she started to use oxygen during the day and family called paramedics and brought to the ER.  She had 2 hospital admissions in 2024 for AKI and hyperosmolar hyperglycemia.  She was sent to skilled nursing facility in January 2024. She did hide fever chills chest pain nausea vomiting diarrhea.  She had a bowel movement yesterday at home.  She denies urinary complaints.  She lives with her elderly husband.  ED Course: Patient received steroids and Levaquin and nebulizer treatments COVID influenza and RSV negative She was 92% on 4 L in the ER I do not see a documentation for saturation on room air. Hemoglobin was 10.9 white count 10.5, sodium 135 potassium 3.9 BUN 32 creatinine 0.86 ABG 7 point 4/49/39/68% BN P was 305 troponin was 17 EKG shows sinus tach and LVH Review of Systems: See above Ambulatory Status: Walks with a walker at home  Past Medical History:  Diagnosis Date   Cellulitis and abscess of left leg    CHF (congestive heart failure) (HCC)    COPD (chronic obstructive pulmonary disease) ([facility])    Diabetes mellitus    Glaucoma    Hyperlipidemia    Hypertension     Past Surgical History:  Procedure Laterality Date   BACK SURGERY     CARDIAC CATHETERIZATION N/A 12/09/2015   Procedure: Right Heart Cath;  Surgeon: Adrian Prows, MD;  Location: [facility];  Service:  Cardiovascular;  Laterality: N/A;   CHOLECYSTECTOMY  2000   HERNIA REPAIR  2007   rupture disk  1970"s   SHOULDER ARTHROSCOPY WITH ROTATOR CUFF REPAIR  1999   rt shoulder    Social History   Socioeconomic History   Marital status: Married    Spouse name: Not on file   Number of children: 5   Years of education: Not on file   Highest education level: Not on file  Occupational History   Not on file  Tobacco Use   Smoking status: Never   Smokeless tobacco: Never  Vaping Use   Vaping Use: Never used  Substance and Sexual Activity   Alcohol use: No   Drug use: No   Sexual activity: Not on file  Other Topics Concern   Not on file  Social History Narrative   Not on file   Social Determinants of Health   Financial Resource Strain: Not on file  Food Insecurity: No Food Insecurity (05/16/2022)   Hunger Vital Sign    Worried About Running Out of Food in the Last Year: Never true    Ran Out of Food in the Last Year: Never true  Transportation Needs: No Transportation Needs (05/16/2022)   PRAPARE - Hydrologist (Medical): No    Lack of Transportation (Non-Medical): No  Physical Activity: Not on file  Stress: Not on file  Social Connections: Not on file  Intimate Partner Violence:  Not At Risk (05/16/2022)   Humiliation, Afraid, Rape, and Kick questionnaire    Fear of Current or Ex-Partner: No    Emotionally Abused: No    Physically Abused: No    Sexually Abused: No    Allergies  Allergen Reactions   Cephalexin Anaphylaxis, Swelling and Other (See Comments)   Micardis [Telmisartan] Other (See Comments)    Hyperkalemia during hospitalization with pulmonary edema   Atorvastatin Other (See Comments)    Other reaction(s): myalgia   Buspirone Other (See Comments)    Other reaction(s): "couldn't breath"   Canagliflozin Other (See Comments)    Pt does not recall   Doxycycline Hyclate Other (See Comments)    Other reaction(s): nausea   Lipitor  [Atorvastatin Calcium] Other (See Comments)    myalgia   Tramadol Nausea Only   Tramadol Hcl Other (See Comments)    Family History  Problem Relation Age of Onset   Stroke Mother       Prior to Admission medications   Medication Sig Start Date End Date Taking? Authorizing Provider  acetaminophen (TYLENOL) 325 MG tablet Take 2 tablets (650 mg total) by mouth every 6 (six) hours as needed for mild pain (or Fever >/= 101). 05/07/22   Sheikh, Omair Latif, DO  amLODipine (NORVASC) 10 MG tablet TAKE 1 TABLET(10 MG) BY MOUTH DAILY 05/19/22   Adrian Prows, MD  Brinzolamide-Brimonidine [facility]) 1-0.2 % SUSP Apply 2 drops to eye daily.    [provider]  glipiZIDE (GLUCOTROL) 5 MG tablet Take 1 tablet (5 mg total) by mouth 2 (two) times daily. 05/21/22 05/21/23  Charlynne Cousins, MD  isosorbide mononitrate (IMDUR) 60 MG 24 hr tablet TAKE 1 TABLET DAILY Patient taking differently: Take 60 mg by mouth daily. 09/15/21   Adrian Prows, MD  liraglutide (VICTOZA) 18 MG/3ML SOPN Inject 0.6 mg into the skin daily.     [provider]  loratadine (CLARITIN) 10 MG tablet Take 1 tablet (10 mg total) by mouth daily. 05/08/22   Raiford Noble Latif, DO  metFORMIN (GLUCOPHAGE) 1000 MG tablet Take 0.5 tablets (500 mg total) by mouth daily. 10/27/16   [name], MD  pantoprazole (PROTONIX) 40 MG tablet Take 1 tablet (40 mg total) by mouth daily. 05/08/22   Raiford Noble Latif, DO  Rivaroxaban (XARELTO) 15 MG TABS tablet Take 1 tablet (15 mg total) by mouth 2 (two) times daily. 05/21/22   Charlynne Cousins, MD  rivaroxaban (XARELTO) 20 MG TABS tablet Take 1 tablet (20 mg total) by mouth daily with supper. 06/03/22   Charlynne Cousins, MD  ROCKLATAN 0.02-0.005 % SOLN Place 1 drop into both eyes at bedtime. 11/24/19   [provider]  zinc sulfate 220 (50 Zn) MG capsule Take 1 capsule (220 mg total) by mouth daily. Patient not taking: Reported on 05/14/2022 05/08/22   Kerney Elbe, DO     Physical Exam: Vitals:   06/22/22 1309  BP: 104/81  Pulse: (!) 117  Resp: 20  Temp: 97.9 F (36.6 C)  TempSrc: Oral  SpO2: 96%     General:  Appears in distress due to bronchospasm Eyes:  PERRL, EOMI, normal lids, iris ENT:  grossly normal hearing, lips & tongue, mmm Neck:  no LAD, masses or thyromegaly Cardiovascular:  RRR, no m/r/g. No LE edema.  Tachycardic Respiratory: Diffuse wheeze during bilaterally, no w/r/r. Normal respiratory effort. Abdomen:  soft, ntnd, NABS distended Skin: Bruises noted in lower extremity Psychiatric:  grossly normal mood and affect, speech  fluent and appropriate, AOx3 Neurologic:  CN 2-12 grossly intact, moves all extremities in coordinated fashion, sensation intact  Labs on Admission: I have personally reviewed following labs and imaging studies  CBC: Recent Labs  Lab 06/22/22 1327  WBC 10.5  NEUTROABS 8.7*  HGB 10.9*  HCT 36.0  MCV 95.5  PLT PLATELET CLUMPS NOTED ON SMEAR, UNABLE TO ESTIMATE   Basic Metabolic Panel: Recent Labs  Lab 06/22/22 1327  NA 135  K 3.9  CL 97*  CO2 27  GLUCOSE 217*  BUN 32*  CREATININE 0.86  CALCIUM 8.6*   GFR: CrCl cannot be calculated (Unknown ideal weight.). Liver Function Tests: Recent Labs  Lab 06/22/22 1327  AST 23  ALT 12  ALKPHOS 74  BILITOT 0.6  PROT 7.2  ALBUMIN 3.2*   No results for input(s): "LIPASE", "AMYLASE" in the last 168 hours. No results for input(s): "AMMONIA" in the last 168 hours. Coagulation Profile: No results for input(s): "INR", "PROTIME" in the last 168 hours. Cardiac Enzymes: No results for input(s): "CKTOTAL", "CKMB", "CKMBINDEX", "TROPONINI" in the last 168 hours. BNP (last 3 results) No results for input(s): "PROBNP" in the last 8760 hours. HbA1C: No results for input(s): "HGBA1C" in the last 72 hours. CBG: No results for input(s): "GLUCAP" in the last 168 hours. Lipid Profile: No results for input(s): "CHOL", "HDL", "LDLCALC", "TRIG", "CHOLHDL",  "LDLDIRECT" in the last 72 hours. Thyroid Function Tests: No results for input(s): "TSH", "T4TOTAL", "FREET4", "T3FREE", "THYROIDAB" in the last 72 hours. Anemia Panel: No results for input(s): "VITAMINB12", "FOLATE", "FERRITIN", "TIBC", "IRON", "RETICCTPCT" in the last 72 hours. Urine analysis:    Component Value Date/Time   COLORURINE YELLOW 05/13/2022 0033   APPEARANCEUR CLEAR 05/13/2022 0033   LABSPEC 1.022 05/13/2022 0033   PHURINE 6.0 05/13/2022 0033   GLUCOSEU >=500 (A) 05/13/2022 0033   HGBUR SMALL (A) 05/13/2022 0033   BILIRUBINUR NEGATIVE 05/13/2022 0033   KETONESUR NEGATIVE 05/13/2022 0033   PROTEINUR NEGATIVE 05/13/2022 0033   UROBILINOGEN 0.2 12/05/2010 1740   NITRITE NEGATIVE 05/13/2022 0033   LEUKOCYTESUR SMALL (A) 05/13/2022 0033    Creatinine Clearance: CrCl cannot be calculated (Unknown ideal weight.).  Sepsis Labs: '@LABRCNTIP'$ (procalcitonin:4,lacticidven:4) ) Recent Results (from the past 240 hour(s))  Resp panel by RT-PCR (RSV, Flu A&B, Covid) Anterior Nasal Swab     Status: None   Collection Time: 06/22/22  1:56 PM   Specimen: Anterior Nasal Swab  Result Value Ref Range Status   SARS Coronavirus 2 by RT PCR NEGATIVE NEGATIVE Final    Comment: (NOTE) SARS-CoV-2 target nucleic acids are NOT DETECTED.  The SARS-CoV-2 RNA is generally detectable in upper respiratory specimens during the acute phase of infection. The lowest concentration of SARS-CoV-2 viral copies this assay can detect is 138 copies/mL. A negative result does not preclude SARS-Cov-2 infection and should not be used as the sole basis for treatment or other patient management decisions. A negative result may occur with  improper specimen collection/handling, submission of specimen other than nasopharyngeal swab, presence of viral mutation(s) within the areas targeted by this assay, and inadequate number of viral copies(<138 copies/mL). A negative result must be combined with clinical  observations, patient history, and epidemiological information. The expected result is Negative.  Fact Sheet for Patients:  EntrepreneurPulse.com.au  Fact Sheet for Healthcare Providers:  IncredibleEmployment.be  This test is no t yet approved or cleared by the [place] FDA and  has been authorized for detection and/or diagnosis of SARS-CoV-2 by FDA under an Emergency Use  Authorization (EUA). This EUA will remain  in effect (meaning this test can be used) for the duration of the COVID-19 declaration under Section 564(b)(1) of the Act, 21 U.S.C.section 360bbb-3(b)(1), unless the authorization is terminated  or revoked sooner.       Influenza A by PCR NEGATIVE NEGATIVE Final   Influenza B by PCR NEGATIVE NEGATIVE Final    Comment: (NOTE) The Xpert Xpress SARS-CoV-2/FLU/RSV plus assay is intended as an aid in the diagnosis of influenza from Nasopharyngeal swab specimens and should not be used as a sole basis for treatment. Nasal washings and aspirates are unacceptable for Xpert Xpress SARS-CoV-2/FLU/RSV testing.  Fact Sheet for Patients: EntrepreneurPulse.com.au  Fact Sheet for Healthcare Providers: IncredibleEmployment.be  This test is not yet approved or cleared by the [place] FDA and has been authorized for detection and/or diagnosis of SARS-CoV-2 by FDA under an Emergency Use Authorization (EUA). This EUA will remain in effect (meaning this test can be used) for the duration of the COVID-19 declaration under Section 564(b)(1) of the Act, 21 U.S.C. section 360bbb-3(b)(1), unless the authorization is terminated or revoked.     Resp Syncytial Virus by PCR NEGATIVE NEGATIVE Final    Comment: (NOTE) Fact Sheet for Patients: EntrepreneurPulse.com.au  Fact Sheet for Healthcare Providers: IncredibleEmployment.be  This test is not yet approved or cleared by  the [place] FDA and has been authorized for detection and/or diagnosis of SARS-CoV-2 by FDA under an Emergency Use Authorization (EUA). This EUA will remain in effect (meaning this test can be used) for the duration of the COVID-19 declaration under Section 564(b)(1) of the Act, 21 U.S.C. section 360bbb-3(b)(1), unless the authorization is terminated or revoked.  Performed at [facility], [place] [address] [place], [place] [address]      Radiological Exams on Admission: DG Chest Port 1 View  Result Date: 06/22/2022 CLINICAL DATA:  COVID positive 1 month ago, cough EXAM: PORTABLE CHEST 1 VIEW COMPARISON:  05/18/2022 FINDINGS: Single frontal view of the chest demonstrates a stable cardiac silhouette. There has been near complete resolution of the retrocardiac airspace disease seen on prior study. Chronic scarring throughout the lungs again noted. No effusion or pneumothorax. No acute bony abnormalities. IMPRESSION: 1. Near complete resolution of the retrocardiac airspace disease seen on prior exam. 2. Chronic background scarring.  No new process. Electronically Signed   By: Randa Ngo M.D.   On: 06/22/2022 15:40    EKG: Sinus tach Assessment/Plan Principal Problem:   COPD with acute exacerbation (HCC)   #1 acute on chronic hypoxic respiratory failure secondary to COPD exacerbation- Nebulizers Steroids Azithromycin Multiple drug allergies ABG shows hypoxia Chest x-ray no acute infiltrates or fluid Check D-dimer with history of DVT though she reports she has been compliant with Xarelto PT consult  #2 uncontrolled type 2 diabetes with hyperglycemia with an A1c above 10-SSI  Hold metformin Victoza glipizide  #3 history of hypertension blood pressure soft hold home antihypertensives Norvasc  #4 history of bilateral lower extremity DVT continue Xarelto  #5 glaucoma continue home eyedrops  #6 chronic diastolic heart failure-not in fluid overload chest  x-ray clear no lower extremity edema not on any diuretics prior to admission  RN Pressure Injury Documentation: Pressure Injury 05/14/22 Coccyx Medial Stage 2 -  Partial thickness loss of dermis presenting as a shallow open injury with a red, pink wound bed without slough. (Active)  05/14/22 2000  Location: Coccyx  Location Orientation: Medial  Staging: Stage 2 -  Partial thickness loss  of dermis presenting as a shallow open injury with a red, pink wound bed without slough.  Wound Description (Comments):   Present on Admission: Yes     Estimated body mass index is 33.07 kg/m as calculated from the following:   Height as of 05/18/22: '5\' 4"'$  (1.626 m).   Weight as of 05/18/22: 87.4 kg.   DVT prophylaxis: Xarelto Code Status: Full code Family Communication: Husband at bedside Disposition Plan: Pending clinical improvement Consults called: None Admission status: Inpatient   [name] MD [facility]  06/22/2022, 5:15 PM

## 2022-06-23 DIAGNOSIS — J441 Chronic obstructive pulmonary disease with (acute) exacerbation: Secondary | ICD-10-CM | POA: Diagnosis not present

## 2022-06-23 LAB — COMPREHENSIVE METABOLIC PANEL
ALT: 12 U/L (ref 0–44)
AST: 16 U/L (ref 15–41)
Albumin: 3.3 g/dL — ABNORMAL LOW (ref 3.5–5.0)
Alkaline Phosphatase: 63 U/L (ref 38–126)
Anion gap: 10 (ref 5–15)
BUN: 35 mg/dL — ABNORMAL HIGH (ref 8–23)
CO2: 26 mmol/L (ref 22–32)
Calcium: 8.8 mg/dL — ABNORMAL LOW (ref 8.9–10.3)
Chloride: 99 mmol/L (ref 98–111)
Creatinine, Ser: 1.13 mg/dL — ABNORMAL HIGH (ref 0.44–1.00)
GFR, Estimated: 47 mL/min — ABNORMAL LOW (ref >60)
Glucose, Bld: 342 mg/dL — ABNORMAL HIGH (ref 70–99)
Potassium: 4.7 mmol/L (ref 3.5–5.1)
Sodium: 135 mmol/L (ref 135–145)
Total Bilirubin: 0.6 mg/dL (ref 0.3–1.2)
Total Protein: 6.8 g/dL (ref 6.5–8.1)

## 2022-06-23 LAB — CBC
HCT: 36.5 % (ref 36.0–46.0)
Hemoglobin: 10.8 g/dL — ABNORMAL LOW (ref 12.0–15.0)
MCH: 28.3 pg (ref 26.0–34.0)
MCHC: 29.6 g/dL — ABNORMAL LOW (ref 30.0–36.0)
MCV: 95.5 fL (ref 80.0–100.0)
Platelets: UNDETERMINED 10*3/uL (ref 150–400)
RBC: 3.82 MIL/uL — ABNORMAL LOW (ref 3.87–5.11)
RDW: 14.9 % (ref 11.5–15.5)
WBC: 4.6 10*3/uL (ref 4.0–10.5)
nRBC: 0 % (ref 0.0–0.2)

## 2022-06-23 LAB — GLUCOSE, CAPILLARY
Glucose-Capillary: 312 mg/dL — ABNORMAL HIGH (ref 70–99)
Glucose-Capillary: 258 mg/dL — ABNORMAL HIGH (ref 70–99)
Glucose-Capillary: 299 mg/dL — ABNORMAL HIGH (ref 70–99)
Glucose-Capillary: 292 mg/dL — ABNORMAL HIGH (ref 70–99)

## 2022-06-23 LAB — D-DIMER, QUANTITATIVE: D-Dimer, Quant: 0.52 ug/mL-FEU — ABNORMAL HIGH (ref 0.00–0.50)

## 2022-06-23 MED ORDER — GLIPIZIDE 2.5 MG HALF TABLET
2.5000 mg | ORAL_TABLET | Freq: Two times a day (BID) | ORAL | Status: DC
  Administered 2022-06-23 – 2022-06-24 (×2): 2.5 mg via ORAL
  Filled 2022-06-23 (×3): qty 1

## 2022-06-23 MED ORDER — INSULIN ASPART 100 UNIT/ML IJ SOLN
5.0000 [IU] | Freq: Once | INTRAMUSCULAR | Status: AC
  Administered 2022-06-23: 5 [IU] via SUBCUTANEOUS

## 2022-06-23 MED ORDER — PANTOPRAZOLE SODIUM 40 MG PO TBEC
40.0000 mg | DELAYED_RELEASE_TABLET | Freq: Every day | ORAL | Status: DC
  Administered 2022-06-23 – 2022-06-26 (×4): 40 mg via ORAL
  Filled 2022-06-23 (×4): qty 1

## 2022-06-23 NOTE — Plan of Care (Signed)

## 2022-06-23 NOTE — Plan of Care (Signed)
Problem: Education: Goal: Knowledge of disease or condition will improve 06/23/2022 2224 by Deveron Furlong, RN Outcome: Progressing 06/23/2022 2223 by Deveron Furlong, RN Outcome: Progressing Goal: Knowledge of the prescribed therapeutic regimen will improve 06/23/2022 2224 by Deveron Furlong, RN Outcome: Progressing 06/23/2022 2223 by Deveron Furlong, RN Outcome: Progressing Goal: Individualized Educational Video(s) 06/23/2022 2224 by Deveron Furlong, RN Outcome: Progressing 06/23/2022 2223 by Deveron Furlong, RN Outcome: Progressing   Problem: Activity: Goal: Ability to tolerate increased activity will improve 06/23/2022 2224 by Deveron Furlong, RN Outcome: Progressing 06/23/2022 2223 by Deveron Furlong, RN Outcome: Progressing Goal: Will verbalize the importance of balancing activity with adequate rest periods 06/23/2022 2224 by Deveron Furlong, RN Outcome: Progressing 06/23/2022 2223 by Deveron Furlong, RN Outcome: Progressing   Problem: Respiratory: Goal: Ability to maintain a clear airway will improve 06/23/2022 2224 by Deveron Furlong, RN Outcome: Progressing 06/23/2022 2223 by Deveron Furlong, RN Outcome: Progressing Goal: Levels of oxygenation will improve 06/23/2022 2224 by Deveron Furlong, RN Outcome: Progressing 06/23/2022 2223 by Deveron Furlong, RN Outcome: Progressing Goal: Ability to maintain adequate ventilation will improve 06/23/2022 2224 by Deveron Furlong, RN Outcome: Progressing 06/23/2022 2223 by Deveron Furlong, RN Outcome: Progressing   Problem: Education: Goal: Ability to describe self-care measures that may prevent or decrease complications (Diabetes Survival Skills Education) will improve 06/23/2022 2224 by Deveron Furlong, RN Outcome: Progressing 06/23/2022 2223 by Deveron Furlong, RN Outcome: Progressing Goal: Individualized Educational Video(s) 06/23/2022 2224 by Deveron Furlong, RN Outcome: Progressing 06/23/2022 2223 by Deveron Furlong,  RN Outcome: Progressing   Problem: Coping: Goal: Ability to adjust to condition or change in health will improve 06/23/2022 2224 by Deveron Furlong, RN Outcome: Progressing 06/23/2022 2223 by Deveron Furlong, RN Outcome: Progressing   Problem: Fluid Volume: Goal: Ability to maintain a balanced intake and output will improve 06/23/2022 2224 by Deveron Furlong, RN Outcome: Progressing 06/23/2022 2223 by Deveron Furlong, RN Outcome: Progressing   Problem: Health Behavior/Discharge Planning: Goal: Ability to identify and utilize available resources and services will improve 06/23/2022 2224 by Deveron Furlong, RN Outcome: Progressing 06/23/2022 2223 by Deveron Furlong, RN Outcome: Progressing Goal: Ability to manage health-related needs will improve 06/23/2022 2224 by Deveron Furlong, RN Outcome: Progressing 06/23/2022 2223 by Deveron Furlong, RN Outcome: Progressing   Problem: Metabolic: Goal: Ability to maintain appropriate glucose levels will improve 06/23/2022 2224 by Deveron Furlong, RN Outcome: Progressing 06/23/2022 2223 by Deveron Furlong, RN Outcome: Progressing   Problem: Nutritional: Goal: Maintenance of adequate nutrition will improve 06/23/2022 2224 by Deveron Furlong, RN Outcome: Progressing 06/23/2022 2223 by Deveron Furlong, RN Outcome: Progressing Goal: Progress toward achieving an optimal weight will improve 06/23/2022 2224 by Deveron Furlong, RN Outcome: Progressing 06/23/2022 2223 by Deveron Furlong, RN Outcome: Progressing   Problem: Skin Integrity: Goal: Risk for impaired skin integrity will decrease 06/23/2022 2224 by Deveron Furlong, RN Outcome: Progressing 06/23/2022 2223 by Deveron Furlong, RN Outcome: Progressing   Problem: Tissue Perfusion: Goal: Adequacy of tissue perfusion will improve 06/23/2022 2224 by Deveron Furlong, RN Outcome: Progressing 06/23/2022 2223 by Deveron Furlong, RN Outcome: Progressing   Problem: Education: Goal: Knowledge of  General Education information will improve Description: Including pain rating scale, medication(s)/side effects and non-pharmacologic comfort measures 06/23/2022 2224 by Deveron Furlong, RN Outcome: Progressing 06/23/2022 2223 by Deveron Furlong, RN Outcome:  Progressing   Problem: Health Behavior/Discharge Planning: Goal: Ability to manage health-related needs will improve 06/23/2022 2224 by Deveron Furlong, RN Outcome: Progressing 06/23/2022 2223 by Deveron Furlong, RN Outcome: Progressing   Problem: Clinical Measurements: Goal: Ability to maintain clinical measurements within normal limits will improve 06/23/2022 2224 by Deveron Furlong, RN Outcome: Progressing 06/23/2022 2223 by Deveron Furlong, RN Outcome: Progressing Goal: Will remain free from infection 06/23/2022 2224 by Deveron Furlong, RN Outcome: Progressing 06/23/2022 2223 by Deveron Furlong, RN Outcome: Progressing Goal: Diagnostic test results will improve 06/23/2022 2224 by Deveron Furlong, RN Outcome: Progressing 06/23/2022 2223 by Deveron Furlong, RN Outcome: Progressing Goal: Respiratory complications will improve 06/23/2022 2224 by Deveron Furlong, RN Outcome: Progressing 06/23/2022 2223 by Deveron Furlong, RN Outcome: Progressing Goal: Cardiovascular complication will be avoided 06/23/2022 2224 by Deveron Furlong, RN Outcome: Progressing 06/23/2022 2223 by Deveron Furlong, RN Outcome: Progressing   Problem: Activity: Goal: Risk for activity intolerance will decrease 06/23/2022 2224 by Deveron Furlong, RN Outcome: Progressing 06/23/2022 2223 by Deveron Furlong, RN Outcome: Progressing   Problem: Nutrition: Goal: Adequate nutrition will be maintained 06/23/2022 2224 by Deveron Furlong, RN Outcome: Progressing 06/23/2022 2223 by Deveron Furlong, RN Outcome: Progressing   Problem: Coping: Goal: Level of anxiety will decrease 06/23/2022 2224 by Deveron Furlong, RN Outcome: Progressing 06/23/2022 2223 by Deveron Furlong, RN Outcome: Progressing   Problem: Elimination: Goal: Will not experience complications related to bowel motility 06/23/2022 2224 by Deveron Furlong, RN Outcome: Progressing 06/23/2022 2223 by Deveron Furlong, RN Outcome: Progressing Goal: Will not experience complications related to urinary retention 06/23/2022 2224 by Deveron Furlong, RN Outcome: Progressing 06/23/2022 2223 by Deveron Furlong, RN Outcome: Progressing   Problem: Pain Managment: Goal: General experience of comfort will improve 06/23/2022 2224 by Deveron Furlong, RN Outcome: Progressing 06/23/2022 2223 by Deveron Furlong, RN Outcome: Progressing   Problem: Safety: Goal: Ability to remain free from injury will improve 06/23/2022 2224 by Deveron Furlong, RN Outcome: Progressing 06/23/2022 2223 by Deveron Furlong, RN Outcome: Progressing   Problem: Skin Integrity: Goal: Risk for impaired skin integrity will decrease 06/23/2022 2224 by Deveron Furlong, RN Outcome: Progressing 06/23/2022 2223 by Deveron Furlong, RN Outcome: Progressing

## 2022-06-23 NOTE — Progress Notes (Signed)
PROGRESS NOTE    [name]  [id number] DOB: 1931-02-16 DOA: 06/22/2022 PCP: [name], DO    Brief Narrative: [name] is a 89 y.o. female with medical history significant of COPD uses 4 L of oxygen at night to sleep, bilateral lower extremity DVT, diastolic heart failure, uncontrolled diabetes, hypertension admitted with shortness of breath and cough for the last 1 week which was progressively getting worse.  She reports she is coughing up thick yellow phlegm.  She normally does not use oxygen during the day however she started to use oxygen during the day and family called paramedics and brought to the ER.  She had 2 hospital admissions in 2024 for AKI and hyperosmolar hyperglycemia.  She was sent to skilled nursing facility in January 2024. She did hide fever chills chest pain nausea vomiting diarrhea.  She had a bowel movement yesterday at home.  She denies urinary complaints.  She lives with her elderly husband.   ED Course: Patient received steroids and Levaquin and nebulizer treatments COVID influenza and RSV negative She was 92% on 4 L in the ER I do not see a documentation for saturation on room air. Hemoglobin was 10.9 white count 10.5, sodium 135 potassium 3.9 BUN 32 creatinine 0.86 ABG 7 . 4/49/39/68% BN P was 305 troponin was 17 EKG shows sinus tach and LVH  Assessment & Plan:   Principal Problem:   COPD with acute exacerbation (HCC)     Pressure Injury 05/14/22 Coccyx Medial Stage 2 -  Partial thickness loss of dermis presenting as a shallow open injury with a red, pink wound bed without slough. (Active)  05/14/22 2000  Location: Coccyx  Location Orientation: Medial  Staging: Stage 2 -  Partial thickness loss of dermis presenting as a shallow open injury with a red, pink wound bed without slough.  Wound Description (Comments):   Present on Admission: Yes    #1 acute on chronic hypoxic respiratory failure secondary to COPD  exacerbation- Nebulizers Steroids Azithromycin Multiple drug allergies ABG shows hypoxia Chest x-ray no acute infiltrates or fluid D-dimer 0.52  history of DVT though she reports she has been compliant with Xarelto PT consult Family requesting nebulizer machine on discharge   #2 uncontrolled type 2 diabetes with hyperglycemia with an A1c above 10-SSI   Restart glipizide at a lower dose.   #3 history of hypertension blood pressure soft hold home antihypertensives Norvasc   #4 history of bilateral lower extremity DVT continue Xarelto   #5 glaucoma continue home eyedrops   #6 chronic diastolic heart failure-not in fluid overload chest x-ray clear no lower extremity edema not on any diuretics prior to admission  Estimated body mass index is 33.07 kg/m as calculated from the following:   Height as of 05/18/22: '5\' 4"'$  (1.626 m).   Weight as of 05/18/22: 87.4 kg.  DVT prophylaxis: Xarelto Code Status: Full code Family Communication: Discussed with daughter Scottie Disposition Plan:  Status is: Inpatient Remains inpatient appropriate because: Acute hypoxic COPD exacerbation   Consultants: None  Procedures: None Antimicrobials: Azithromycin  Subjective:  Patient diffusely wheezing No events overnight Objective: Vitals:   06/23/22 0247 06/23/22 0625 06/23/22 0829 06/23/22 1359  BP: 117/69 (!) 114/58  (!) 128/42  Pulse: 85 75  81  Resp: '16 17  20  '$ Temp: 97.6 F (36.4 C) 97.6 F (36.4 C)  98.6 F (37 C)  TempSrc: Oral Oral    SpO2: 99% 100% 100% 98%    Intake/Output Summary (Last  24 hours) at 06/23/2022 1511 Last data filed at 06/23/2022 0300 Gross per 24 hour  Intake 120 ml  Output --  Net 120 ml   There were no vitals filed for this visit.  Examination: Very hard of hearing  General exam: Appears calm and comfortable  Respiratory system: Diffuse wheezing and rhonchi to auscultation. Respiratory effort normal. Cardiovascular system: S1 & S2 heard, RRR. No JVD,  murmurs, rubs, gallops or clicks. No pedal edema. Gastrointestinal system: Abdomen is nondistended, soft and nontender. No organomegaly or masses felt. Normal bowel sounds heard. Central nervous system: Alert and oriented. No focal neurological deficits. Extremities: No edema  skin: No rashes, lesions or ulcers Psychiatry: Judgement and insight appear normal. Mood & affect appropriate.     Data Reviewed: I have personally reviewed following labs and imaging studies  CBC: Recent Labs  Lab 06/22/22 1327 06/23/22 0550  WBC 10.5 4.6  NEUTROABS 8.7*  --   HGB 10.9* 10.8*  HCT 36.0 36.5  MCV 95.5 95.5  PLT PLATELET CLUMPS NOTED ON SMEAR, UNABLE TO ESTIMATE PLATELET CLUMPS NOTED ON SMEAR, UNABLE TO ESTIMATE   Basic Metabolic Panel: Recent Labs  Lab 06/22/22 1327 06/23/22 0550  NA 135 135  K 3.9 4.7  CL 97* 99  CO2 27 26  GLUCOSE 217* 342*  BUN 32* 35*  CREATININE 0.86 1.13*  CALCIUM 8.6* 8.8*   GFR: CrCl cannot be calculated (Unknown ideal weight.). Liver Function Tests: Recent Labs  Lab 06/22/22 1327 06/23/22 0550  AST 23 16  ALT 12 12  ALKPHOS 74 63  BILITOT 0.6 0.6  PROT 7.2 6.8  ALBUMIN 3.2* 3.3*   No results for input(s): "LIPASE", "AMYLASE" in the last 168 hours. No results for input(s): "AMMONIA" in the last 168 hours. Coagulation Profile: No results for input(s): "INR", "PROTIME" in the last 168 hours. Cardiac Enzymes: No results for input(s): "CKTOTAL", "CKMB", "CKMBINDEX", "TROPONINI" in the last 168 hours. BNP (last 3 results) No results for input(s): "PROBNP" in the last 8760 hours. HbA1C: No results for input(s): "HGBA1C" in the last 72 hours. CBG: Recent Labs  Lab 06/22/22 2048 06/23/22 0726 06/23/22 1153  GLUCAP 290* 312* 258*   Lipid Profile: No results for input(s): "CHOL", "HDL", "LDLCALC", "TRIG", "CHOLHDL", "LDLDIRECT" in the last 72 hours. Thyroid Function Tests: No results for input(s): "TSH", "T4TOTAL", "FREET4", "T3FREE",  "THYROIDAB" in the last 72 hours. Anemia Panel: No results for input(s): "VITAMINB12", "FOLATE", "FERRITIN", "TIBC", "IRON", "RETICCTPCT" in the last 72 hours. Sepsis Labs: Recent Labs  Lab 06/22/22 1327 06/22/22 1700  LATICACIDVEN 1.6 1.2    Recent Results (from the past 240 hour(s))  Culture, blood (routine x 2)     Status: None (Preliminary result)   Collection Time: 06/22/22  1:22 PM   Specimen: BLOOD  Result Value Ref Range Status   Specimen Description   Final    BLOOD RIGHT ANTECUBITAL Performed at [facility] [address], [place], [place] [address]    Special Requests   Final    BOTTLES DRAWN AEROBIC AND ANAEROBIC Blood Culture results may not be optimal due to an excessive volume of blood received in culture bottles Performed at [facility] [address], [place], [place] [address]    Culture   Final    NO GROWTH < 24 HOURS Performed at [facility] [address], [place], [place] [address]    Report Status PENDING  Incomplete  Resp panel by RT-PCR (RSV, Flu A&B,  Covid) Anterior Nasal Swab     Status: None   Collection Time: 06/22/22  1:56 PM   Specimen: Anterior Nasal Swab  Result Value Ref Range Status   SARS Coronavirus 2 by RT PCR NEGATIVE NEGATIVE Final    Comment: (NOTE) SARS-CoV-2 target nucleic acids are NOT DETECTED.  The SARS-CoV-2 RNA is generally detectable in upper respiratory specimens during the acute phase of infection. The lowest concentration of SARS-CoV-2 viral copies this assay can detect is 138 copies/mL. A negative result does not preclude SARS-Cov-2 infection and should not be used as the sole basis for treatment or other patient management decisions. A negative result may occur with  improper specimen collection/handling, submission of specimen other than nasopharyngeal swab, presence of viral mutation(s) within the areas targeted by this assay, and inadequate number of  viral copies(<138 copies/mL). A negative result must be combined with clinical observations, patient history, and epidemiological information. The expected result is Negative.  Fact Sheet for Patients:  EntrepreneurPulse.com.au  Fact Sheet for Healthcare Providers:  IncredibleEmployment.be  This test is no t yet approved or cleared by the [place] FDA and  has been authorized for detection and/or diagnosis of SARS-CoV-2 by FDA under an Emergency Use Authorization (EUA). This EUA will remain  in effect (meaning this test can be used) for the duration of the COVID-19 declaration under Section 564(b)(1) of the Act, 21 U.S.C.section 360bbb-3(b)(1), unless the authorization is terminated  or revoked sooner.       Influenza A by PCR NEGATIVE NEGATIVE Final   Influenza B by PCR NEGATIVE NEGATIVE Final    Comment: (NOTE) The Xpert Xpress SARS-CoV-2/FLU/RSV plus assay is intended as an aid in the diagnosis of influenza from Nasopharyngeal swab specimens and should not be used as a sole basis for treatment. Nasal washings and aspirates are unacceptable for Xpert Xpress SARS-CoV-2/FLU/RSV testing.  Fact Sheet for Patients: EntrepreneurPulse.com.au  Fact Sheet for Healthcare Providers: IncredibleEmployment.be  This test is not yet approved or cleared by the [place] FDA and has been authorized for detection and/or diagnosis of SARS-CoV-2 by FDA under an Emergency Use Authorization (EUA). This EUA will remain in effect (meaning this test can be used) for the duration of the COVID-19 declaration under Section 564(b)(1) of the Act, 21 U.S.C. section 360bbb-3(b)(1), unless the authorization is terminated or revoked.     Resp Syncytial Virus by PCR NEGATIVE NEGATIVE Final    Comment: (NOTE) Fact Sheet for Patients: EntrepreneurPulse.com.au  Fact Sheet for Healthcare  Providers: IncredibleEmployment.be  This test is not yet approved or cleared by the [place] FDA and has been authorized for detection and/or diagnosis of SARS-CoV-2 by FDA under an Emergency Use Authorization (EUA). This EUA will remain in effect (meaning this test can be used) for the duration of the COVID-19 declaration under Section 564(b)(1) of the Act, 21 U.S.C. section 360bbb-3(b)(1), unless the authorization is terminated or revoked.  Performed at [facility], [place] [address], [place], [place] [address]   Culture, blood (routine x 2)     Status: None (Preliminary result)   Collection Time: 06/22/22  5:00 PM   Specimen: BLOOD  Result Value Ref Range Status   Specimen Description   Final    BLOOD BLOOD RIGHT HAND Performed at [facility] [address], [place], [place] [address]    Special Requests   Final    BOTTLES DRAWN AEROBIC AND ANAEROBIC Blood Culture results may not be optimal due to an excessive volume  of blood received in culture bottles Performed at [facility], [place] [address] [place], [place] [address]    Culture   Final    NO GROWTH < 24 HOURS Performed at [facility] [address] [place], [place] [address]    Report Status PENDING  Incomplete         Radiology Studies: DG Chest Port 1 View  Result Date: 06/22/2022 CLINICAL DATA:  COVID positive 1 month ago, cough EXAM: PORTABLE CHEST 1 VIEW COMPARISON:  05/18/2022 FINDINGS: Single frontal view of the chest demonstrates a stable cardiac silhouette. There has been near complete resolution of the retrocardiac airspace disease seen on prior study. Chronic scarring throughout the lungs again noted. No effusion or pneumothorax. No acute bony abnormalities. IMPRESSION: 1. Near complete resolution of the retrocardiac airspace disease seen on prior exam. 2. Chronic background scarring.  No new process. Electronically  Signed   By: Randa Ngo M.D.   On: 06/22/2022 15:40     Scheduled Meds:  azithromycin  500 mg Oral Daily   brinzolamide  1 drop Both Eyes TID   And   brimonidine  1 drop Both Eyes TID   insulin aspart  0-20 Units Subcutaneous TID WC   insulin aspart  6 Units Subcutaneous TID WC   levalbuterol  0.63 mg Nebulization Q6H   methylPREDNISolone (SOLU-MEDROL) injection  40 mg Intravenous Q12H   Netarsudil-Latanoprost  1 drop Both Eyes QHS   rivaroxaban  20 mg Oral Q breakfast   umeclidinium-vilanterol  1 puff Inhalation Daily   Continuous Infusions:   LOS: 1 day    Time spent: 35 minutes  [name], MD  06/23/2022, 3:11 PM

## 2022-06-24 DIAGNOSIS — J441 Chronic obstructive pulmonary disease with (acute) exacerbation: Secondary | ICD-10-CM | POA: Diagnosis not present

## 2022-06-24 LAB — GLUCOSE, CAPILLARY
Glucose-Capillary: 315 mg/dL — ABNORMAL HIGH (ref 70–99)
Glucose-Capillary: 335 mg/dL — ABNORMAL HIGH (ref 70–99)
Glucose-Capillary: 51 mg/dL — ABNORMAL LOW (ref 70–99)
Glucose-Capillary: 52 mg/dL — ABNORMAL LOW (ref 70–99)
Glucose-Capillary: 77 mg/dL (ref 70–99)
Glucose-Capillary: 267 mg/dL — ABNORMAL HIGH (ref 70–99)

## 2022-06-24 LAB — COMPREHENSIVE METABOLIC PANEL
ALT: 11 U/L (ref 0–44)
AST: 18 U/L (ref 15–41)
Albumin: 3.1 g/dL — ABNORMAL LOW (ref 3.5–5.0)
Alkaline Phosphatase: 62 U/L (ref 38–126)
Anion gap: 9 (ref 5–15)
BUN: 38 mg/dL — ABNORMAL HIGH (ref 8–23)
CO2: 25 mmol/L (ref 22–32)
Calcium: 8.6 mg/dL — ABNORMAL LOW (ref 8.9–10.3)
Chloride: 98 mmol/L (ref 98–111)
Creatinine, Ser: 1.02 mg/dL — ABNORMAL HIGH (ref 0.44–1.00)
GFR, Estimated: 53 mL/min — ABNORMAL LOW (ref >60)
Glucose, Bld: 307 mg/dL — ABNORMAL HIGH (ref 70–99)
Potassium: 4.4 mmol/L (ref 3.5–5.1)
Sodium: 132 mmol/L — ABNORMAL LOW (ref 135–145)
Total Bilirubin: 0.4 mg/dL (ref 0.3–1.2)
Total Protein: 6.2 g/dL — ABNORMAL LOW (ref 6.5–8.1)

## 2022-06-24 LAB — CBC
HCT: 33.4 % — ABNORMAL LOW (ref 36.0–46.0)
Hemoglobin: 10.3 g/dL — ABNORMAL LOW (ref 12.0–15.0)
MCH: 28.9 pg (ref 26.0–34.0)
MCHC: 30.8 g/dL (ref 30.0–36.0)
MCV: 93.6 fL (ref 80.0–100.0)
Platelets: UNDETERMINED 10*3/uL (ref 150–400)
RBC: 3.57 MIL/uL — ABNORMAL LOW (ref 3.87–5.11)
RDW: 14.7 % (ref 11.5–15.5)
WBC: 9.9 10*3/uL (ref 4.0–10.5)
nRBC: 0 % (ref 0.0–0.2)

## 2022-06-24 MED ORDER — METFORMIN HCL 500 MG PO TABS
500.0000 mg | ORAL_TABLET | Freq: Every day | ORAL | Status: DC
  Administered 2022-06-24 – 2022-06-26 (×3): 500 mg via ORAL
  Filled 2022-06-24 (×3): qty 1

## 2022-06-24 MED ORDER — ISOSORBIDE MONONITRATE ER 60 MG PO TB24
60.0000 mg | ORAL_TABLET | Freq: Every day | ORAL | Status: DC
  Administered 2022-06-24 – 2022-06-26 (×3): 60 mg via ORAL
  Filled 2022-06-24 (×3): qty 1

## 2022-06-24 MED ORDER — INSULIN ASPART 100 UNIT/ML IJ SOLN
5.0000 [IU] | Freq: Once | INTRAMUSCULAR | Status: AC
  Administered 2022-06-24: 5 [IU] via SUBCUTANEOUS

## 2022-06-24 MED ORDER — INSULIN ASPART 100 UNIT/ML IJ SOLN
3.0000 [IU] | Freq: Three times a day (TID) | INTRAMUSCULAR | Status: DC
  Administered 2022-06-25 – 2022-06-26 (×3): 3 [IU] via SUBCUTANEOUS

## 2022-06-24 MED ORDER — HYDROCHLOROTHIAZIDE 12.5 MG PO TABS
12.5000 mg | ORAL_TABLET | Freq: Every day | ORAL | Status: DC
  Administered 2022-06-24 – 2022-06-26 (×3): 12.5 mg via ORAL
  Filled 2022-06-24 (×3): qty 1

## 2022-06-24 MED ORDER — LIRAGLUTIDE 18 MG/3ML ~~LOC~~ SOPN: 0.6000 mg | PEN_INJECTOR | Freq: Every day | SUBCUTANEOUS | Status: DC

## 2022-06-24 MED ORDER — LEVALBUTEROL HCL 0.63 MG/3ML IN NEBU
0.6300 mg | INHALATION_SOLUTION | Freq: Four times a day (QID) | RESPIRATORY_TRACT | Status: DC
  Administered 2022-06-24 – 2022-06-26 (×7): 0.63 mg via RESPIRATORY_TRACT
  Filled 2022-06-24 (×7): qty 3

## 2022-06-24 NOTE — Inpatient Diabetes Management (Signed)
Inpatient Diabetes Program Recommendations  AACE/ADA: New Consensus Statement on Inpatient Glycemic Control (2015)  Target Ranges:  Prepandial:   less than 140 mg/dL      Peak postprandial:   less than 180 mg/dL (1-2 hours)      Critically ill patients:  140 - 180 mg/dL   Lab Results  Component Value Date   GLUCAP 315 (H) 06/24/2022   HGBA1C 10.3 (H) 05/03/2022    Review of Glycemic Control  Diabetes history: DM2 Outpatient Diabetes medications: Victoza 0.6 mg QD, metformin 500 mg QD, glipizide 5 mg BID (not taking) Current orders for Inpatient glycemic control: glipizide 2.5 mg BID, Novolog 0-20 TID + 6 units TID  On Solumedrol 40 Q12H FBS - 307, 315 this am  Inpatient Diabetes Program Recommendations:    Consider adding Semglee 10-12 units QD  Will follow glucose trends. May need titration of rapid-acting insulin.   Continue to follow.  Thank you. Lorenda Peck, RD, LDN, [facility] Inpatient Diabetes Coordinator [phone]

## 2022-06-24 NOTE — TOC Initial Note (Signed)
Transition of Care [facility]) - Initial/Assessment Note    Patient Details  Name: [name] MRN: [id number] Date of Birth: July 31, 1930  Transition of Care [facility]) CM/SW Contact:    Ninfa Meeker, RN Phone Number: 06/24/2022, 10:06 AM  Clinical Narrative:                  Patient is a 89 yr old female admitted with shortness of breath and cough. On 4L oxygen at home. Having COPD Exacerbation.  Transition of Care [facility]) Department has reviewed patient and no TOC needs have been identified at this time. We will continue to monitor patient advancement through Interdisciplinary progressions and if new patient needs arise, please place a consult.          Patient Goals and CMS Choice            Expected Discharge Plan and Services                                              Prior Living Arrangements/Services                       Activities of Daily Living Home Assistive Devices/Equipment: Gilford Rile (specify type) ADL Screening (condition at time of admission) Patient's cognitive ability adequate to safely complete daily activities?: Yes Is the patient deaf or have difficulty hearing?: Yes Does the patient have difficulty seeing, even when wearing glasses/contacts?: No Does the patient have difficulty concentrating, remembering, or making decisions?: No Patient able to express need for assistance with ADLs?: No Does the patient have difficulty dressing or bathing?: Yes Independently performs ADLs?: Yes (appropriate for developmental age) Does the patient have difficulty walking or climbing stairs?: Yes Weakness of Legs: Both Weakness of Arms/Hands: None  Permission Sought/Granted                  Emotional Assessment              Admission diagnosis:  COPD exacerbation ([place]) [J44.1] COPD with acute exacerbation ([place]) [J44.1] Patient Active Problem List   Diagnosis Date Noted   COPD with acute exacerbation ([place]) 06/22/2022   Rectal  bleeding 05/17/2022   UTI (urinary tract infection) 05/16/2022   Pressure injury of skin 05/16/2022   Acute bronchitis 05/15/2022   Elevated lactic acid level 05/14/2022   Hyperkalemia 05/14/2022   Hyperosmolar hyperglycemic state (HHS) ([place]) 05/13/2022   History of DVT (deep vein thrombosis) 05/13/2022   COVID-19 virus infection 05/04/2022   COPD exacerbation ([place]) 05/04/2022   AKI (acute kidney injury) ([place]) 05/03/2022   Hyponatremia 05/03/2022   Elevated LFTs 05/03/2022   Chronic hypoxic respiratory failure ([place]) 05/03/2022   Anxiety 12/18/2020   Arthritis 12/18/2020   Aortic valve disorder 12/18/2020   Chronic obstructive pulmonary disease, unspecified ([place]) 12/18/2020   Diabetic neuropathy ([place]) 12/18/2020   Diabetic renal disease ([place]) 12/18/2020   Heart failure ([place]) 12/18/2020   Hyperlipidemia 12/18/2020   Mitral regurgitation 12/18/2020   Morbid obesity ([place]) 12/18/2020   Obstructive sleep apnea syndrome 12/18/2020   Vitamin B12 deficiency (non anemic) 12/18/2020   Other long term (current) drug therapy 12/18/2020   CAP (community acquired pneumonia) 12/07/2019   Cellulitis 02/26/2019   Essential hypertension 11/03/2017   Sepsis ([place]) 11/03/2017   Type II diabetes mellitus with renal manifestations ([place]) 11/03/2017   Cellulitis of right leg  11/03/2017   Flank pain 11/03/2017   Chronic diastolic CHF (congestive heart failure) ([place]) 03/19/2017   Diabetes mellitus ([place]) 03/19/2017   Obesity, diabetes, and hypertension syndrome ([place]) 03/19/2017   CKD (chronic kidney disease) stage 3, GFR 30-59 ml/min (HCC) 03/19/2017   Syncope 10/24/2016   Pulmonary hypertension ([place]) 12/07/2015   Varicose veins of leg with complications [id number]   Varicose veins of left lower extremity with complications [id number]   Varicose veins of lower extremities with other complications [id number]   Lipoma of back 07/19/2012   PCP:  [name], DO Pharmacy:   [facility], [place] [address] [place] [address] Phone: [phone] Fax: [facility] [place] [address] [place] [address] [place] [address] Phone: [phone] Fax: [phone]     Social Determinants of Health (SDOH) Social History: [place]: No Food Insecurity (05/16/2022)  Housing: Low Risk  (05/03/2022)  Transportation Needs: No Transportation Needs (05/16/2022)  Utilities: Not At Risk (05/16/2022)  Tobacco Use: Low Risk  (06/22/2022)   SDOH Interventions:     Readmission Risk Interventions    05/07/2022    3:17 PM  Readmission Risk Prevention Plan  Transportation Screening Complete  PCP or Specialist Appt within 3-5 Days Complete  HRI or [place] Complete  Social Work Consult for [place] Planning/Counseling Complete  Palliative Care Screening Not Applicable  Medication Review Press photographer) Complete

## 2022-06-24 NOTE — Plan of Care (Signed)

## 2022-06-24 NOTE — Progress Notes (Signed)
PROGRESS NOTE   [name]  [id number] DOB: 01/31/1931 DOA: 06/22/2022 PCP: [name], DO  Brief Narrative:   60 wf female current SNF dwelling COPD Gold stage III-IV 4 L oxygen at bedtime Prior bilateral L LE DVTs HFpEF Uncontrolled DM TY 2 HTN COVID PNA Rx remdesivir when hospitalized 1/7 through 1/11  Recent hospitalization 1/17-1/26/2024 with honk, possible aspiration PNA Rx antibiotics discharged on Augmentin, single episode rectal bleed with stable hemoglobin AKI  Represent [facility] ED SOB productive sputum mild chest pain with cough Rx Levaquin nebs  BNP 304 troponin 17   Hospital-Problem based course  Acute exacerbation of underlying COPD Gold stage III-IV usually at baseline 4 L of oxygen with activity -Still wheezy throughout, continue Solu-Medrol current dose for 24 more hours and then long prednisone taper expected -Continue Anoro Ellipta 1 puff daily, add back Xopenex every 6 hourly scheduled for 12 doses -Continue azithromycin for respiratory toilet  Diabetes mellitus with complication of hypoglycemia this admission - Blood sugar as low as 52 just now - Discontinue glipizide 5 twice daily, discontinue Victoza 0.6 daily - Okay to continue metformin 500 every morning - Cut back mealtime from 63 units 3 times daily meals and continue sliding scale coverage  Previous coronavirus 19 hospitalized 1/7 through 05/07/2022 - Possible some element of underlying fibrosis? - Outpatient pulmonology referral if does not recover completely within 4 weeks  Hypotension in the setting of hypertension HFpEF with BNP 300 this admission - Was not on diuretics prior to admission - She does not really have lower extremity edema - May need to discharge home on q. OD Lasix  -Adding HCTZ 12.5 today  Prior history DVT - Continues on Xarelto continue the same - Stable for discharge with home health PT  DVT prophylaxis: Xarelto 20 daily Code Status: Full code confirmed Family  Communication: None present at the bedside called daughter on phone Disposition:  Status is: Inpatient Remains inpatient appropriate because:   Needs Stabiity from glycemia     Subjective:  Well No fever no cough no cold no n/v Sugar dropped  Objective: Vitals:   06/23/22 0829 06/23/22 1359 06/23/22 2122 06/24/22 0224  BP:  (!) 128/42 (!) 128/58 (!) 127/59  Pulse:  81 79 73  Resp:  '20 20 20  '$ Temp:  98.6 F (37 C) 97.8 F (36.6 C) 97.8 F (36.6 C)  TempSrc:   Oral Oral  SpO2: 100% 98% 100% 100%    Intake/Output Summary (Last 24 hours) at 06/24/2022 0830 Last data filed at 06/24/2022 0600 Gross per 24 hour  Intake 120 ml  Output 1000 ml  Net -880 ml   There were no vitals filed for this visit.  Examination:  Eomi ncat , no icterus, no pallor CTA b some wheeze no rales Abd soft nt Trace Le edema Neuro intact no focal deficit, quite HOH   Data Reviewed: personally reviewed   CBC    Component Value Date/Time   WBC 9.9 06/24/2022 0545   RBC 3.57 (L) 06/24/2022 0545   HGB 10.3 (L) 06/24/2022 0545   HGB 11.5 10/17/2019 1332   HCT 33.4 (L) 06/24/2022 0545   HCT 35.2 10/17/2019 1332   PLT PLATELET CLUMPS NOTED ON SMEAR, UNABLE TO ESTIMATE 06/24/2022 0545   PLT CANCELED 10/17/2019 1332   MCV 93.6 06/24/2022 0545   MCV 93 10/17/2019 1332   MCH 28.9 06/24/2022 0545   MCHC 30.8 06/24/2022 0545   RDW 14.7 06/24/2022 0545   RDW 12.5 10/17/2019 1332  LYMPHSABS 1.0 06/22/2022 1327   MONOABS 0.5 06/22/2022 1327   EOSABS 0.0 06/22/2022 1327   BASOSABS 0.1 06/22/2022 1327      Latest Ref Rng & Units 06/24/2022    5:45 AM 06/23/2022    5:50 AM 06/22/2022    1:27 PM  CMP  Glucose 70 - 99 mg/dL 307  342  217   BUN 8 - 23 mg/dL 38  35  32   Creatinine 0.44 - 1.00 mg/dL 1.02  1.13  0.86   Sodium 135 - 145 mmol/L 132  135  135   Potassium 3.5 - 5.1 mmol/L 4.4  4.7  3.9   Chloride 98 - 111 mmol/L 98  99  97   CO2 22 - 32 mmol/L '25  26  27   '$ Calcium 8.9 - 10.3 mg/dL  8.6  8.8  8.6   Total Protein 6.5 - 8.1 g/dL 6.2  6.8  7.2   Total Bilirubin 0.3 - 1.2 mg/dL 0.4  0.6  0.6   Alkaline Phos 38 - 126 U/L 62  63  74   AST 15 - 41 U/L '18  16  23   '$ ALT 0 - 44 U/L '11  12  12      '$ Radiology Studies: DG Chest Port 1 View  Result Date: 06/22/2022 CLINICAL DATA:  COVID positive 1 month ago, cough EXAM: PORTABLE CHEST 1 VIEW COMPARISON:  05/18/2022 FINDINGS: Single frontal view of the chest demonstrates a stable cardiac silhouette. There has been near complete resolution of the retrocardiac airspace disease seen on prior study. Chronic scarring throughout the lungs again noted. No effusion or pneumothorax. No acute bony abnormalities. IMPRESSION: 1. Near complete resolution of the retrocardiac airspace disease seen on prior exam. 2. Chronic background scarring.  No new process. Electronically Signed   By: Randa Ngo M.D.   On: 06/22/2022 15:40     Scheduled Meds:  azithromycin  500 mg Oral Daily   brinzolamide  1 drop Both Eyes TID   And   brimonidine  1 drop Both Eyes TID   glipiZIDE  2.5 mg Oral BID AC   insulin aspart  0-20 Units Subcutaneous TID WC   insulin aspart  6 Units Subcutaneous TID WC   methylPREDNISolone (SOLU-MEDROL) injection  40 mg Intravenous Q12H   Netarsudil-Latanoprost  1 drop Both Eyes QHS   pantoprazole  40 mg Oral Daily   rivaroxaban  20 mg Oral Q breakfast   umeclidinium-vilanterol  1 puff Inhalation Daily   Continuous Infusions:   LOS: 2 days   Time spent: [place], MD [facility] To contact the attending provider between 7A-7P or the covering provider during after hours 7P-7A, please log into the web site [email or web address] and access using universal [place] password for that web site. If you do not have the password, please call the hospital operator.  06/24/2022, 8:30 AM

## 2022-06-24 NOTE — Evaluation (Signed)
Physical Therapy Evaluation Patient Details Name: [name] MRN: [id number] DOB: 1930/12/28 Today's Date: 06/24/2022  History of Present Illness  [name] is a 89 y.o. female with medical history significant of COPD uses 4 L of oxygen at night to sleep, bilateral lower extremity DVT, diastolic heart failure, uncontrolled diabetes, hypertension admitted with shortness of breath and cough for the last 1 week which was progressively getting worse.  Clinical Impression  The patient is mobilizing  well in the room. Patient reports plans for Dc soon. PT will sign off, can be followed by mobility specialist while in hospital.       Recommendations for follow up therapy are one component of a multi-disciplinary discharge planning process, led by the attending physician.  Recommendations may be updated based on patient status, additional functional criteria and insurance authorization.  Follow Up Recommendations Home health PT      Assistance Recommended at Discharge PRN  Patient can return home with the following  Help with stairs or ramp for entrance;Assistance with cooking/housework;Assist for transportation    Equipment Recommendations None recommended by PT  Recommendations for Other Services       Functional Status Assessment Patient has had a recent decline in their functional status and demonstrates the ability to make significant improvements in function in a reasonable and predictable amount of time.     Precautions / Restrictions Precautions Precautions: Fall      Mobility  Bed Mobility Overal bed mobility: Modified Independent                  Transfers Overall transfer level: Modified independent Equipment used: Rolling walker (2 wheels)                    Ambulation/Gait Ambulation/Gait assistance: Modified independent (Device/Increase time) Gait Distance (Feet): 90 Feet Assistive device: Rolling walker (2 wheels) Gait  Pattern/deviations: Step-through pattern   Gait velocity interpretation: <1.31 ft/sec, indicative of household ambulator   General Gait Details: WFL for in home  Stairs            Wheelchair Mobility    Modified Rankin (Stroke Patients Only)       Balance Overall balance assessment: No apparent balance deficits (not formally assessed)                                           Pertinent Vitals/Pain Pain Assessment Pain Assessment: No/denies pain    Home Living Family/patient expects to be discharged to:: Private residence Living Arrangements: Spouse/significant other;Children Available Help at Discharge: Family;Available 24 hours/day Type of Home: House Home Access: Ramped entrance       Home Layout: One level Home Equipment: Cane - single point;Rollator (4 wheels) Additional Comments: daughter helps her as needed. dtr works full time. wears oxygen at night    Prior Function Prior Level of Function : Independent/Modified Independent             Mobility Comments: no device ADLs Comments: independent, still cooks.     Hand Dominance   Dominant Hand: Right    Extremity/Trunk Assessment   Upper Extremity Assessment Upper Extremity Assessment: Overall WFL for tasks assessed    Lower Extremity Assessment Lower Extremity Assessment: Overall WFL for tasks assessed    Cervical / Trunk Assessment Cervical / Trunk Assessment: Normal  Communication   Communication: HOH  Cognition Arousal/Alertness: Awake/alert  Behavior During Therapy: WFL for tasks assessed/performed Overall Cognitive Status: Within Functional Limits for tasks assessed                                          General Comments      Exercises     Assessment/Plan    PT Assessment Patient does not need any further PT services  PT Problem List         PT Treatment Interventions      PT Goals (Current goals can be found in the Care Plan  section)  Acute Rehab PT Goals Patient Stated Goal: to go home home PT Goal Formulation: All assessment and education complete, DC therapy    Frequency       Co-evaluation               AM-PAC PT "6 Clicks" Mobility  Outcome Measure Help needed turning from your back to your side while in a flat bed without using bedrails?: None Help needed moving from lying on your back to sitting on the side of a flat bed without using bedrails?: None Help needed moving to and from a bed to a chair (including a wheelchair)?: None Help needed standing up from a chair using your arms (e.g., wheelchair or bedside chair)?: None Help needed to walk in hospital room?: None Help needed climbing 3-5 steps with a railing? : A Little 6 Click Score: 23    End of Session   Activity Tolerance: Patient tolerated treatment well Patient left: in bed;with call bell/phone within reach Nurse Communication: Mobility status PT Visit Diagnosis: Unsteadiness on feet (R26.81)    Time: [id number] PT Time Calculation (min) (ACUTE ONLY): 18 min   Charges:   PT Evaluation $PT Eval Low Complexity: 1 Low          [place] Office [phone] Weekend [phone]   Claretha Cooper 06/24/2022, 5:08 PM

## 2022-06-25 DIAGNOSIS — J441 Chronic obstructive pulmonary disease with (acute) exacerbation: Secondary | ICD-10-CM | POA: Diagnosis not present

## 2022-06-25 LAB — CBC WITH DIFFERENTIAL/PLATELET
Abs Immature Granulocytes: 0.11 10*3/uL — ABNORMAL HIGH (ref 0.00–0.07)
Basophils Absolute: 0 10*3/uL (ref 0.0–0.1)
Basophils Relative: 0 %
Eosinophils Absolute: 0 10*3/uL (ref 0.0–0.5)
Eosinophils Relative: 0 %
HCT: 33.1 % — ABNORMAL LOW (ref 36.0–46.0)
Hemoglobin: 10.3 g/dL — ABNORMAL LOW (ref 12.0–15.0)
Immature Granulocytes: 1 %
Lymphocytes Relative: 11 %
Lymphs Abs: 1.1 10*3/uL (ref 0.7–4.0)
MCH: 28.4 pg (ref 26.0–34.0)
MCHC: 31.1 g/dL (ref 30.0–36.0)
MCV: 91.2 fL (ref 80.0–100.0)
Monocytes Absolute: 0.4 10*3/uL (ref 0.1–1.0)
Monocytes Relative: 4 %
Neutro Abs: 8.4 10*3/uL — ABNORMAL HIGH (ref 1.7–7.7)
Neutrophils Relative %: 84 %
Platelets: UNDETERMINED 10*3/uL (ref 150–400)
RBC: 3.63 MIL/uL — ABNORMAL LOW (ref 3.87–5.11)
RDW: 14.9 % (ref 11.5–15.5)
WBC: 10 10*3/uL (ref 4.0–10.5)
nRBC: 0 % (ref 0.0–0.2)

## 2022-06-25 LAB — BASIC METABOLIC PANEL
Anion gap: 9 (ref 5–15)
BUN: 43 mg/dL — ABNORMAL HIGH (ref 8–23)
CO2: 26 mmol/L (ref 22–32)
Calcium: 8.7 mg/dL — ABNORMAL LOW (ref 8.9–10.3)
Chloride: 96 mmol/L — ABNORMAL LOW (ref 98–111)
Creatinine, Ser: 0.94 mg/dL (ref 0.44–1.00)
GFR, Estimated: 58 mL/min — ABNORMAL LOW (ref >60)
Glucose, Bld: 356 mg/dL — ABNORMAL HIGH (ref 70–99)
Potassium: 4.2 mmol/L (ref 3.5–5.1)
Sodium: 131 mmol/L — ABNORMAL LOW (ref 135–145)

## 2022-06-25 LAB — GLUCOSE, CAPILLARY
Glucose-Capillary: 307 mg/dL — ABNORMAL HIGH (ref 70–99)
Glucose-Capillary: 339 mg/dL — ABNORMAL HIGH (ref 70–99)
Glucose-Capillary: 127 mg/dL — ABNORMAL HIGH (ref 70–99)
Glucose-Capillary: 216 mg/dL — ABNORMAL HIGH (ref 70–99)

## 2022-06-25 MED ORDER — HYDROCHLOROTHIAZIDE 12.5 MG PO TABS: 12.5000 mg | ORAL_TABLET | Freq: Every day | ORAL | 1 refills | Status: DC

## 2022-06-25 MED ORDER — UMECLIDINIUM-VILANTEROL 62.5-25 MCG/ACT IN AEPB: 1.0000 | INHALATION_SPRAY | Freq: Every day | RESPIRATORY_TRACT | 2 refills | Status: DC

## 2022-06-25 MED ORDER — ALBUTEROL SULFATE HFA 108 (90 BASE) MCG/ACT IN AERS: 2.0000 | INHALATION_SPRAY | Freq: Four times a day (QID) | RESPIRATORY_TRACT | 2 refills | Status: DC | PRN

## 2022-06-25 MED ORDER — PREDNISONE 20 MG PO TABS: ORAL_TABLET | ORAL | 0 refills | Status: AC

## 2022-06-25 MED ORDER — AZITHROMYCIN 250 MG PO TABS: ORAL_TABLET | ORAL | 0 refills | Status: DC

## 2022-06-25 MED ORDER — INSULIN ASPART PROT & ASPART (70-30 MIX) 100 UNIT/ML PEN: 10.0000 [IU] | PEN_INJECTOR | Freq: Two times a day (BID) | SUBCUTANEOUS | 11 refills | Status: DC

## 2022-06-25 MED ORDER — INSULIN ASPART 100 UNIT/ML IJ SOLN
4.0000 [IU] | Freq: Once | INTRAMUSCULAR | Status: AC
  Administered 2022-06-25: 4 [IU] via SUBCUTANEOUS

## 2022-06-25 MED ORDER — PREDNISONE 50 MG PO TABS
60.0000 mg | ORAL_TABLET | Freq: Every day | ORAL | Status: DC
  Administered 2022-06-26: 10 mg via ORAL
  Administered 2022-06-26: 50 mg via ORAL
  Filled 2022-06-25: qty 1

## 2022-06-25 MED ORDER — LEVALBUTEROL HCL 0.63 MG/3ML IN NEBU: 0.6300 mg | INHALATION_SOLUTION | Freq: Four times a day (QID) | RESPIRATORY_TRACT | 12 refills | Status: DC

## 2022-06-25 NOTE — Plan of Care (Signed)

## 2022-06-25 NOTE — Inpatient Diabetes Management (Signed)
Inpatient Diabetes Program Recommendations  AACE/ADA: New Consensus Statement on Inpatient Glycemic Control (2015)  Target Ranges:  Prepandial:   less than 140 mg/dL      Peak postprandial:   less than 180 mg/dL (1-2 hours)      Critically ill patients:  140 - 180 mg/dL   Lab Results  Component Value Date   GLUCAP 307 (H) 06/25/2022   HGBA1C 10.3 (H) 05/03/2022    Latest Reference Range & Units 06/24/22 07:49 06/24/22 11:25 06/24/22 16:46 06/24/22 17:10 06/24/22 17:25 06/24/22 21:37 06/25/22 07:58  Glucose-Capillary 70 - 99 mg/dL 315 (H) Novolog 21 units 335 (H) Novolog 21 units 51 (L) 52 (L) 77 267 (H) Novolog 5 units 307 (H) Novolog 18 units  (H): Data is abnormally high (L): Data is abnormally low  Diabetes history: DM2 Outpatient Diabetes medications: Victoza 0.6 mg QD, metformin 500 mg QD, glipizide 5 mg BID (not taking) Current orders for Inpatient glycemic control: Novolog 0-20 TID correction, 3 units TID meal coverage, Prednisone 60 mg qd  Inpatient Diabetes Program Recommendations:   Patient had hypoglycemia post Novolog correction 16:46pm yesterday. Patient received total of Novolog 47 units over the past 24 hrs. Please consider: -Add Semglee 12 units qd (0.15 units/kg x 82.6 kg) -Decrease Novolog correction to 0-9 units tid, 0-5 units hs  Thank you, Bethena Roys E. Onell Mcmath, RN, MSN, CDE  Diabetes Coordinator Inpatient Glycemic Control Team Team Pager [phone] (8am-5pm) 06/25/2022 10:20 AM

## 2022-06-25 NOTE — Progress Notes (Signed)
Mobility Specialist - Progress Note   06/25/22 0946  Mobility  Activity Ambulated with assistance in hallway  Level of Assistance Modified independent, requires aide device or extra time  Assistive Device Front wheel walker  Distance Ambulated (ft) 160 ft  Activity Response Tolerated well  Mobility Referral Yes  $Mobility charge 1 Mobility   Pt received on bench and agreed to mobility. Pt had no c/o pain nor discomfort during session. Pt returned to chair with all needs met.  Roderick Pee Mobility Specialist

## 2022-06-25 NOTE — Progress Notes (Signed)
D/c Cancelled as still needs education guarding insulin teaching etc. and sugars are still occasionally in the 300s  Likely can discharge tomorrow-this was an unavoidable delay  Verneita Griffes, MD [facility] 6:32 PM

## 2022-06-25 NOTE — Discharge Summary (Signed)
Physician Discharge Summary  [name] [id number] DOB: 04-18-1931 DOA: 06/22/2022  PCP: [name], DO  Admit date: 06/22/2022 Discharge date: 06/25/2022  Time spent: 47 minutes  Recommendations for Outpatient Follow-up:  Needs Chem-12 CBC in about 1 week as started on diuretic this hospitalization Recommend outpatient chest x-ray in about 4 weeks Prescribed Xopenex nebs in addition to Anoro Ellipta inhaler on discharge, needs education regarding rescue inhaler with albuterol as well and will order the same, needs outpatient education regarding spacer use Recommend ambulatory referral via either PCP or dietitian for diabetic counseling and teaching-see med changes as below--- no more Victoza no more glipizide given drops in blood sugar--titrate carefully 70/30 insulin as may need less once steroid taper and Steroid taper to be extended if needed in the outpatient setting--- if no improvement would recommend referral to pulmonology if this is not already done by PCP (had COVID in January of this year) Please titrate her thiazide diuretic as PCP sees fit Has incidentaloma on CT imaging in 2021, recent ultrasound 05/03/2022-showed increase in size of this spleen mass,, please consider as outpatient repeat CT scan in 2 to 6 months versus referral  Discharge Diagnoses:  MAIN problem for hospitalization   Acute exacerbation of Gold stage III COPD usually on 4 L of oxygen Somewhat brittle diabetes mellitus secondary to steroid use in the setting of COPD Hypertension CKD 3 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]  Please see below for itemized issues addressed in HOpsital- refer to other progress notes for clarity if needed  Discharge Condition: Improved  Diet recommendation: Diabetic  Filed Weights   06/24/22 0900 06/24/22 1000  Weight: 87.1 kg 82.6 kg    History of present illness:  44 wf female current SNF dwelling COPD Gold stage III-IV 4 L oxygen at bedtime Prior  bilateral L LE DVTs HFpEF Uncontrolled DM TY 2 HTN COVID PNA Rx remdesivir when hospitalized 1/7 through 1/11   Recent hospitalization 1/17-1/26/2024 with honk, possible aspiration PNA Rx antibiotics discharged on Augmentin, single episode rectal bleed with stable hemoglobin AKI   Represent [facility] ED SOB productive sputum mild chest pain with cough Rx Levaquin nebs  BNP 304 troponin 17    Hospital Course:  Acute exacerbation of underlying COPD Gold stage III -Received IV steroids until 2/29 and as was not wheezing significantly was ambulatory and was at baseline oxygen levels was given a long steroid taper as documented below - Prescribed Anoro Ellipta inhaler in addition to albuterol inhaler--she needs education about when to use these in addition to teaching on how to use a spacer - Given Xopenex nebulizer and will complete azithromycin in the outpatient setting - PCP to consider pulmonology referral (never smoker recent COVID)  Recent coronavirus in January 2024  admitted in 2021 for community-acquired pneumonia - She may have some element of underlying interstitial lung disease versus fibrosis -She has been on oxygen for a while -She would benefit from pulmonology referral  Uncontrolled diabetes is mellitus with both hyper and hypoglycemia Recent admission for hyperosmolar nonketotic state and January -Simplifying regimen to 70/30 insulin 10 units twice daily with the help of diabetic coordinator who will review the patient prior to discharge - Continue metformin 500 twice daily cautiously - I did discontinue the patient's glipizide given hypoglycemia on 2/28 in addition to Victoza  HFpEF with a history of hypertension BNP of 300 this hospitalization - Blood pressure was uncontrolled and decision made to address heart failure in addition to hypertension with 1 medication -  HCTZ on discharge 12.5 - Check chemistries including potentially magnesium 1 week from discharge-CC  PCP  DVT diagnosed in the setting of COVID-19 admission - Continue Xarelto 20 daily - Will get home health PT OT   Discharge Exam: Vitals:   06/25/22 0542 06/25/22 0730  BP: 132/66   Pulse: 70   Resp: 18   Temp: 98.1 F (36.7 C)   SpO2: 99% 99%    Subj on day of d/c   Well walk edthe halls no desat no pain no fever no chills   General Exam on discharge  Thick neck Mallampati 4 no fever no chills [place] wheeze no rales no rhonchi posterolaterally Looks very comfortable Abdomen is soft no rebound no guarding Trace lower extremity edema ROM is intact  Neuro is intact moving 4 limbs equally  Discharge Instructions    Allergies as of 06/25/2022       Reactions   Buspirone Shortness Of Breath, Other (See Comments)   "couldn't breathe"   Cephalexin Anaphylaxis, Swelling, Other (See Comments)   Micardis [telmisartan] Other (See Comments)   Hyperkalemia during hospitalization with pulmonary edema   Atorvastatin Other (See Comments)   Myalgia   Canagliflozin Other (See Comments)   Pt does not recall reaction   Doxycycline Hyclate Nausea Only   Lipitor [atorvastatin Calcium] Other (See Comments)   Myalgia   Tramadol Nausea Only        Medication List     STOP taking these medications    glipiZIDE 5 MG tablet Commonly known as: GLUCOTROL   liraglutide 18 MG/3ML Sopn Commonly known as: VICTOZA   loratadine 10 MG tablet Commonly known as: CLARITIN       TAKE these medications    Tylenol 8 Hour Arthritis Pain 650 MG CR tablet Generic drug: acetaminophen Take 650-1,300 mg by mouth every 8 (eight) hours as needed for pain.   acetaminophen 325 MG tablet Commonly known as: TYLENOL Take 2 tablets (650 mg total) by mouth every 6 (six) hours as needed for mild pain (or Fever >/= 101).   albuterol 108 (90 Base) MCG/ACT inhaler Commonly known as: VENTOLIN HFA Inhale 2 puffs into the lungs every 6 (six) hours as needed for wheezing or shortness of breath.    amLODipine 10 MG tablet Commonly known as: NORVASC TAKE 1 TABLET(10 MG) BY MOUTH DAILY What changed: See the new instructions.   azithromycin 250 MG tablet Commonly known as: ZITHROMAX Finish all meds Start taking on: June 26, 2022   hydrochlorothiazide 12.5 MG tablet Commonly known as: HYDRODIURIL Take 1 tablet (12.5 mg total) by mouth daily. Start taking on: June 26, 2022   insulin aspart protamine - aspart (70-30) 100 UNIT/ML FlexPen Commonly known as: NOVOLOG 70/30 MIX Inject 10 Units into the skin 2 (two) times daily with a meal.   isosorbide mononitrate 60 MG 24 hr tablet Commonly known as: IMDUR TAKE 1 TABLET DAILY   levalbuterol 0.63 MG/3ML nebulizer solution Commonly known as: XOPENEX Take 3 mLs (0.63 mg total) by nebulization every 6 (six) hours.   metFORMIN 1000 MG tablet Commonly known as: GLUCOPHAGE Take 0.5 tablets (500 mg total) by mouth daily.   pantoprazole 40 MG tablet Commonly known as: PROTONIX Take 1 tablet (40 mg total) by mouth daily.   predniSONE 20 MG tablet Commonly known as: DELTASONE Take 3 tablets (60 mg total) by mouth daily before breakfast for 4 days, THEN 2 tablets (40 mg total) daily before breakfast for 4 days, THEN 1 tablet (20  mg total) daily before breakfast for 4 days. Start taking on: June 26, 2022   rivaroxaban 20 MG Tabs tablet Commonly known as: XARELTO Take 1 tablet (20 mg total) by mouth daily with supper. What changed:  when to take this Another medication with the same name was removed. Continue taking this medication, and follow the directions you see here.   Rocklatan 0.02-0.005 % Soln Generic drug: Netarsudil-Latanoprost Place 1 drop into both eyes at bedtime.   Simbrinza 1-0.2 % Susp Generic drug: Brinzolamide-Brimonidine Place 1-2 drops into both eyes daily.   umeclidinium-vilanterol 62.5-25 MCG/ACT Aepb Commonly known as: ANORO ELLIPTA Inhale 1 puff into the lungs daily. Start taking on: June 26, 2022    zinc sulfate 220 (50 Zn) MG capsule Take 1 capsule (220 mg total) by mouth daily.       Allergies  Allergen Reactions   Buspirone Shortness Of Breath and Other (See Comments)    "couldn't breathe"   Cephalexin Anaphylaxis, Swelling and Other (See Comments)   Micardis [Telmisartan] Other (See Comments)    Hyperkalemia during hospitalization with pulmonary edema   Atorvastatin Other (See Comments)    Myalgia    Canagliflozin Other (See Comments)    Pt does not recall reaction   Doxycycline Hyclate Nausea Only   Lipitor [Atorvastatin Calcium] Other (See Comments)    Myalgia    Tramadol Nausea Only    Follow-up Information     [facility] Follow up.   Specialty: Home Health Services Why: Someone from [facility] will contact you to resume your Home Health therapy. Contact information: [place] [address] [phone]                  The results of significant diagnostics from this hospitalization (including imaging, microbiology, ancillary and laboratory) are listed below for reference.    Significant Diagnostic Studies: DG Chest Port 1 View  Result Date: 06/22/2022 CLINICAL DATA:  COVID positive 1 month ago, cough EXAM: PORTABLE CHEST 1 VIEW COMPARISON:  05/18/2022 FINDINGS: Single frontal view of the chest demonstrates a stable cardiac silhouette. There has been near complete resolution of the retrocardiac airspace disease seen on prior study. Chronic scarring throughout the lungs again noted. No effusion or pneumothorax. No acute bony abnormalities. IMPRESSION: 1. Near complete resolution of the retrocardiac airspace disease seen on prior exam. 2. Chronic background scarring.  No new process. Electronically Signed   By: Randa Ngo M.D.   On: 06/22/2022 15:40    Microbiology: Recent Results (from the past 240 hour(s))  Culture, blood (routine x 2)     Status: None (Preliminary result)   Collection Time:  06/22/22  1:22 PM   Specimen: BLOOD  Result Value Ref Range Status   Specimen Description   Final    BLOOD RIGHT ANTECUBITAL Performed at [facility] [address] [place], [place] [address]    Special Requests   Final    BOTTLES DRAWN AEROBIC AND ANAEROBIC Blood Culture results may not be optimal due to an excessive volume of blood received in culture bottles Performed at [facility] [address] [place], [place] [address]    Culture   Final    NO GROWTH < 24 HOURS Performed at [facility] [address] [place], [place] [address]    Report Status PENDING  Incomplete  Resp panel by RT-PCR (RSV, Flu A&B, Covid) Anterior Nasal Swab     Status:  None   Collection Time: 06/22/22  1:56 PM   Specimen: Anterior Nasal Swab  Result Value Ref Range Status   SARS Coronavirus 2 by RT PCR NEGATIVE NEGATIVE Final    Comment: (NOTE) SARS-CoV-2 target nucleic acids are NOT DETECTED.  The SARS-CoV-2 RNA is generally detectable in upper respiratory specimens during the acute phase of infection. The lowest concentration of SARS-CoV-2 viral copies this assay can detect is 138 copies/mL. A negative result does not preclude SARS-Cov-2 infection and should not be used as the sole basis for treatment or other patient management decisions. A negative result may occur with  improper specimen collection/handling, submission of specimen other than nasopharyngeal swab, presence of viral mutation(s) within the areas targeted by this assay, and inadequate number of viral copies(<138 copies/mL). A negative result must be combined with clinical observations, patient history, and epidemiological information. The expected result is Negative.  Fact Sheet for Patients:  EntrepreneurPulse.com.au  Fact Sheet for Healthcare Providers:  IncredibleEmployment.be  This test is no t yet approved or cleared by the [place]  FDA and  has been authorized for detection and/or diagnosis of SARS-CoV-2 by FDA under an Emergency Use Authorization (EUA). This EUA will remain  in effect (meaning this test can be used) for the duration of the COVID-19 declaration under Section 564(b)(1) of the Act, 21 U.S.C.section 360bbb-3(b)(1), unless the authorization is terminated  or revoked sooner.       Influenza A by PCR NEGATIVE NEGATIVE Final   Influenza B by PCR NEGATIVE NEGATIVE Final    Comment: (NOTE) The Xpert Xpress SARS-CoV-2/FLU/RSV plus assay is intended as an aid in the diagnosis of influenza from Nasopharyngeal swab specimens and should not be used as a sole basis for treatment. Nasal washings and aspirates are unacceptable for Xpert Xpress SARS-CoV-2/FLU/RSV testing.  Fact Sheet for Patients: EntrepreneurPulse.com.au  Fact Sheet for Healthcare Providers: IncredibleEmployment.be  This test is not yet approved or cleared by the [place] FDA and has been authorized for detection and/or diagnosis of SARS-CoV-2 by FDA under an Emergency Use Authorization (EUA). This EUA will remain in effect (meaning this test can be used) for the duration of the COVID-19 declaration under Section 564(b)(1) of the Act, 21 U.S.C. section 360bbb-3(b)(1), unless the authorization is terminated or revoked.     Resp Syncytial Virus by PCR NEGATIVE NEGATIVE Final    Comment: (NOTE) Fact Sheet for Patients: EntrepreneurPulse.com.au  Fact Sheet for Healthcare Providers: IncredibleEmployment.be  This test is not yet approved or cleared by the [place] FDA and has been authorized for detection and/or diagnosis of SARS-CoV-2 by FDA under an Emergency Use Authorization (EUA). This EUA will remain in effect (meaning this test can be used) for the duration of the COVID-19 declaration under Section 564(b)(1) of the Act, 21 U.S.C. section  360bbb-3(b)(1), unless the authorization is terminated or revoked.  Performed at [facility], [place] [address] [place], [place] [address]   Culture, blood (routine x 2)     Status: None (Preliminary result)   Collection Time: 06/22/22  5:00 PM   Specimen: BLOOD  Result Value Ref Range Status   Specimen Description   Final    BLOOD BLOOD RIGHT HAND Performed at [facility] [address] [place], [place] [address]    Special Requests   Final    BOTTLES DRAWN AEROBIC AND ANAEROBIC Blood Culture results may not be optimal due to an excessive volume of blood received in culture bottles Performed at [facility]  [place] [address] [place], [place] [address]    Culture   Final    NO GROWTH < 24 HOURS Performed at [facility] [address] [place], [place] [address]    Report Status PENDING  Incomplete     Labs: Basic Metabolic Panel: Recent Labs  Lab 06/22/22 1327 06/23/22 0550 06/24/22 0545 06/25/22 0537  NA 135 135 132* 131*  K 3.9 4.7 4.4 4.2  CL 97* 99 98 96*  CO2 '27 26 25 26  '$ GLUCOSE 217* 342* 307* 356*  BUN 32* 35* 38* 43*  CREATININE 0.86 1.13* 1.02* 0.94  CALCIUM 8.6* 8.8* 8.6* 8.7*   Liver Function Tests: Recent Labs  Lab 06/22/22 1327 06/23/22 0550 06/24/22 0545  AST '23 16 18  '$ ALT '12 12 11  '$ ALKPHOS 74 63 62  BILITOT 0.6 0.6 0.4  PROT 7.2 6.8 6.2*  ALBUMIN 3.2* 3.3* 3.1*   No results for input(s): "LIPASE", "AMYLASE" in the last 168 hours. No results for input(s): "AMMONIA" in the last 168 hours. CBC: Recent Labs  Lab 06/22/22 1327 06/23/22 0550 06/24/22 0545 06/25/22 0537  WBC 10.5 4.6 9.9 10.0  NEUTROABS 8.7*  --   --  8.4*  HGB 10.9* 10.8* 10.3* 10.3*  HCT 36.0 36.5 33.4* 33.1*  MCV 95.5 95.5 93.6 91.2  PLT PLATELET CLUMPS NOTED ON SMEAR, UNABLE TO ESTIMATE PLATELET CLUMPS NOTED ON SMEAR, UNABLE TO ESTIMATE PLATELET CLUMPS NOTED ON SMEAR, UNABLE TO ESTIMATE PLATELET CLUMPS  NOTED ON SMEAR, UNABLE TO ESTIMATE   Cardiac Enzymes: No results for input(s): "CKTOTAL", "CKMB", "CKMBINDEX", "TROPONINI" in the last 168 hours. BNP: BNP (last 3 results) Recent Labs    05/03/22 1000 06/22/22 1327  BNP 127.4* 305.9*    ProBNP (last 3 results) No results for input(s): "PROBNP" in the last 8760 hours.  CBG: Recent Labs  Lab 06/24/22 1646 06/24/22 1710 06/24/22 1725 06/24/22 2137 06/25/22 0758  GLUCAP 51* 52* 77 267* 307*       Signed:  Nita Sells MD   [facility] 06/25/2022, 10:56 AM

## 2022-06-25 NOTE — TOC Progression Note (Signed)
Transition of Care [facility]) - Progression Note    Patient Details  Name: [name] MRN: [id number] Date of Birth: [birth year]  Transition of Care [facility]) CM/SW Contact  Teresa Grayer Beverely Pace, RN Phone Number: 06/25/2022, 10:07 AM  Clinical Narrative:    Case manager spoke with patient's daughter, [name], [phone], (patient is extremely HOH). Discussed recommendation for HHPT/OT at discharge. [name] states that her mom is active with  [facility]. CM contacted [name], [facility] Liaison to confirm and inform of need to resume st discharge. No further needs Identified.    Expected Discharge Plan: [place] Barriers to Discharge: Continued Medical Work up  Expected Discharge Plan and Services   Discharge Planning Services: CM Consult Post Acute Care Choice: Resumption of Svcs/PTA Provider Living arrangements for the past 2 months: Single Family Home                 DME Arranged: N/A DME Agency: NA       HH Arranged: PT, OT HH Agency: [place] Date [place]: 06/25/22 Time [place]: 1006 Representative spoke with at [place]: [place] ([place]) Interventions SDOH Screenings   Food Insecurity: No Food Insecurity (05/16/2022)  Housing: Low Risk  (05/03/2022)  Transportation Needs: No Transportation Needs (05/16/2022)  Utilities: Not At Risk (05/16/2022)  Tobacco Use: Low Risk  (06/22/2022)    Readmission Risk Interventions    05/07/2022    3:17 PM  Readmission Risk Prevention Plan  Transportation Screening Complete  PCP or Specialist Appt within 3-5 Days Complete  HRI or [place] Complete  Social Work Consult for [place] Planning/Counseling Complete  Palliative Care Screening Not Applicable  Medication Review Press photographer) Complete

## 2022-06-25 NOTE — Care Management Important Message (Signed)
Important Message  Patient Details IM Letter given. Name: [name] MRN: [id number] Date of Birth: 1931/02/19   Medicare Important Message Given:  Yes     Kerin Salen 06/25/2022, 10:33 AM

## 2022-06-26 ENCOUNTER — Other Ambulatory Visit (HOSPITAL_COMMUNITY): Payer: Self-pay

## 2022-06-26 LAB — CBC WITH DIFFERENTIAL/PLATELET
Abs Immature Granulocytes: 0.09 10*3/uL — ABNORMAL HIGH (ref 0.00–0.07)
Basophils Absolute: 0 10*3/uL (ref 0.0–0.1)
Basophils Relative: 0 %
Eosinophils Absolute: 0 10*3/uL (ref 0.0–0.5)
Eosinophils Relative: 0 %
HCT: 32 % — ABNORMAL LOW (ref 36.0–46.0)
Hemoglobin: 9.7 g/dL — ABNORMAL LOW (ref 12.0–15.0)
Immature Granulocytes: 1 %
Lymphocytes Relative: 9 %
Lymphs Abs: 1.4 10*3/uL (ref 0.7–4.0)
MCH: 28.2 pg (ref 26.0–34.0)
MCHC: 30.3 g/dL (ref 30.0–36.0)
MCV: 93 fL (ref 80.0–100.0)
Monocytes Absolute: 1 10*3/uL (ref 0.1–1.0)
Monocytes Relative: 7 %
Neutro Abs: 12.4 10*3/uL — ABNORMAL HIGH (ref 1.7–7.7)
Neutrophils Relative %: 83 %
Platelets: UNDETERMINED 10*3/uL (ref 150–400)
RBC: 3.44 MIL/uL — ABNORMAL LOW (ref 3.87–5.11)
RDW: 15.4 % (ref 11.5–15.5)
WBC: 14.8 10*3/uL — ABNORMAL HIGH (ref 4.0–10.5)
nRBC: 0 % (ref 0.0–0.2)

## 2022-06-26 LAB — BASIC METABOLIC PANEL
Anion gap: 9 (ref 5–15)
BUN: 46 mg/dL — ABNORMAL HIGH (ref 8–23)
CO2: 26 mmol/L (ref 22–32)
Calcium: 8.7 mg/dL — ABNORMAL LOW (ref 8.9–10.3)
Chloride: 100 mmol/L (ref 98–111)
Creatinine, Ser: 0.91 mg/dL (ref 0.44–1.00)
GFR, Estimated: >60 mL/min (ref >60)
Glucose, Bld: 174 mg/dL — ABNORMAL HIGH (ref 70–99)
Potassium: 3.7 mmol/L (ref 3.5–5.1)
Sodium: 135 mmol/L (ref 135–145)

## 2022-06-26 LAB — GLUCOSE, CAPILLARY: Glucose-Capillary: 166 mg/dL — ABNORMAL HIGH (ref 70–99)

## 2022-06-26 NOTE — Inpatient Diabetes Management (Signed)
Inpatient Diabetes Program Recommendations  AACE/ADA: New Consensus Statement on Inpatient Glycemic Control (2015)  Target Ranges:  Prepandial:   less than 140 mg/dL      Peak postprandial:   less than 180 mg/dL (1-2 hours)      Critically ill patients:  140 - 180 mg/dL   Lab Results  Component Value Date   GLUCAP 166 (H) 06/26/2022   HGBA1C 10.3 (H) 05/03/2022    Review of Glycemic Control  Diabetes history: DM2  For discharge:   70/30 10 units BID, metformin 500 BID  D/C'ed glipizide and Victoza.  Inpatient Diabetes Program Recommendations:  Educated patient's daughter on insulin pen use at home. Reviewed contents of insulin flexpen starter kit. Reviewed all steps if insulin pen including attachment of needle, 2-unit air shot, dialing up dose, giving injection, removing needle, disposal of sharps, storage of unused insulin, disposal of insulin etc. Patient able to provide successful return demonstration. Also reviewed troubleshooting with insulin pen. MD to give patient Rxs for insulin pens and insulin pen needles.  Daughter states pt has plenty of insulin pen needles at home.  Discussed hypoglycemia s/s and treatment. Instructed to monitor blood sugars before breakfast and before dinner. Discussed blood sugar goals and importance of f/u with PCP.   Daughter appreciative of information.  Thank you. Lorenda Peck, RD, LDN, [place] Inpatient Diabetes Coordinator [phone]

## 2022-06-27 LAB — CULTURE, BLOOD (ROUTINE X 2)
Culture: NO GROWTH
Culture: NO GROWTH

## 2022-06-29 ENCOUNTER — Other Ambulatory Visit: Payer: Self-pay

## 2022-06-29 ENCOUNTER — Encounter (HOSPITAL_COMMUNITY): Payer: Self-pay

## 2022-06-29 ENCOUNTER — Emergency Department (HOSPITAL_COMMUNITY)
Admission: EM | Admit: 2022-06-29 | Discharge: 2022-06-30 | Disposition: A | Discharge status: Home / Self Care | Payer: Medicare Other | Attending: Emergency Medicine | Admitting: Emergency Medicine

## 2022-06-29 DIAGNOSIS — D179 Benign lipomatous neoplasm, unspecified: Secondary | ICD-10-CM | POA: Diagnosis not present

## 2022-06-29 DIAGNOSIS — Z7901 Long term (current) use of anticoagulants: Secondary | ICD-10-CM | POA: Diagnosis not present

## 2022-06-29 DIAGNOSIS — Z79899 Other long term (current) drug therapy: Secondary | ICD-10-CM | POA: Diagnosis not present

## 2022-06-29 DIAGNOSIS — J449 Chronic obstructive pulmonary disease, unspecified: Secondary | ICD-10-CM | POA: Insufficient documentation

## 2022-06-29 DIAGNOSIS — E1122 Type 2 diabetes mellitus with diabetic chronic kidney disease: Secondary | ICD-10-CM | POA: Insufficient documentation

## 2022-06-29 DIAGNOSIS — I130 Hypertensive heart and chronic kidney disease with heart failure and stage 1 through stage 4 chronic kidney disease, or unspecified chronic kidney disease: Secondary | ICD-10-CM | POA: Insufficient documentation

## 2022-06-29 DIAGNOSIS — E1165 Type 2 diabetes mellitus with hyperglycemia: Secondary | ICD-10-CM | POA: Diagnosis not present

## 2022-06-29 DIAGNOSIS — R0789 Other chest pain: Secondary | ICD-10-CM | POA: Diagnosis not present

## 2022-06-29 DIAGNOSIS — N189 Chronic kidney disease, unspecified: Secondary | ICD-10-CM | POA: Insufficient documentation

## 2022-06-29 DIAGNOSIS — D171 Benign lipomatous neoplasm of skin and subcutaneous tissue of trunk: Secondary | ICD-10-CM | POA: Diagnosis not present

## 2022-06-29 DIAGNOSIS — Z794 Long term (current) use of insulin: Secondary | ICD-10-CM | POA: Diagnosis not present

## 2022-06-29 DIAGNOSIS — Z7984 Long term (current) use of oral hypoglycemic drugs: Secondary | ICD-10-CM | POA: Diagnosis not present

## 2022-06-29 DIAGNOSIS — M549 Dorsalgia, unspecified: Secondary | ICD-10-CM | POA: Diagnosis not present

## 2022-06-29 DIAGNOSIS — M545 Low back pain, unspecified: Secondary | ICD-10-CM | POA: Diagnosis not present

## 2022-06-29 DIAGNOSIS — I509 Heart failure, unspecified: Secondary | ICD-10-CM | POA: Diagnosis not present

## 2022-06-29 DIAGNOSIS — R739 Hyperglycemia, unspecified: Secondary | ICD-10-CM

## 2022-06-29 LAB — CBC WITH DIFFERENTIAL/PLATELET
Abs Immature Granulocytes: 0.33 10*3/uL — ABNORMAL HIGH (ref 0.00–0.07)
Basophils Absolute: 0 10*3/uL (ref 0.0–0.1)
Basophils Relative: 0 %
Eosinophils Absolute: 0 10*3/uL (ref 0.0–0.5)
Eosinophils Relative: 0 %
HCT: 35.8 % — ABNORMAL LOW (ref 36.0–46.0)
Hemoglobin: 11.1 g/dL — ABNORMAL LOW (ref 12.0–15.0)
Immature Granulocytes: 2 %
Lymphocytes Relative: 7 %
Lymphs Abs: 1.2 10*3/uL (ref 0.7–4.0)
MCH: 28.4 pg (ref 26.0–34.0)
MCHC: 31 g/dL (ref 30.0–36.0)
MCV: 91.6 fL (ref 80.0–100.0)
Monocytes Absolute: 1 10*3/uL (ref 0.1–1.0)
Monocytes Relative: 6 %
Neutro Abs: 14.4 10*3/uL — ABNORMAL HIGH (ref 1.7–7.7)
Neutrophils Relative %: 85 %
Platelets: UNDETERMINED 10*3/uL (ref 150–400)
RBC: 3.91 MIL/uL (ref 3.87–5.11)
RDW: 15.1 % (ref 11.5–15.5)
WBC: 16.9 10*3/uL — ABNORMAL HIGH (ref 4.0–10.5)
nRBC: 0 % (ref 0.0–0.2)

## 2022-06-29 LAB — CBG MONITORING, ED: Glucose-Capillary: 403 mg/dL — ABNORMAL HIGH (ref 70–99)

## 2022-06-29 MED ORDER — LIDOCAINE 5 % EX PTCH
1.0000 | MEDICATED_PATCH | CUTANEOUS | Status: DC
  Administered 2022-06-29: 1 via TRANSDERMAL
  Filled 2022-06-29: qty 1

## 2022-06-29 MED ORDER — OXYCODONE HCL 5 MG PO TABS
5.0000 mg | ORAL_TABLET | Freq: Once | ORAL | Status: AC
  Administered 2022-06-29: 5 mg via ORAL
  Filled 2022-06-29: qty 1

## 2022-06-29 NOTE — ED Provider Notes (Signed)
[facility] Provider Note   CSN: [id number] Arrival date & time: 06/29/22  2216     History {Add pertinent medical, surgical, social history, OB history to HPI:1} Chief Complaint  Patient presents with   Back Pain   Shortness of Breath    Pt bib ems for lower right back pain due to a cyst she has had for 2 years that is getting worse, pt states that the pain is taking her breath away causing SOB. 10/10. Aching. Wears 4L [place] at home as needed. Pt is alert and oriented x4    [name] is a 89 y.o. female.   Back Pain Shortness of Breath Patient presents for back pain.  Medical history includes back lipoma, pulmonary hypertension, CHF, DM, obesity, CKD, HTN, anxiety, COPD, neuropathy, OSA, HLD.  She had a recent hospital admission for COPD exacerbation.  She was discharged 4 days ago.  Patient states that she has had a lipoma located on her mid right side of back for the past 40 years.  She states that it has been painful over the past several weeks, including while she was hospitalized.  Due to the pain, patient's family called EMS.  Patient states that her breathing feels at baseline.  She denies any other areas of discomfort.     Home Medications Prior to Admission medications   Medication Sig Start Date End Date Taking? Authorizing Provider  acetaminophen (TYLENOL) 325 MG tablet Take 2 tablets (650 mg total) by mouth every 6 (six) hours as needed for mild pain (or Fever >/= 101). Patient not taking: Reported on 06/23/2022 05/07/22   Raiford Noble Latif, DO  albuterol (VENTOLIN HFA) 108 [phone] Base) MCG/ACT inhaler Inhale 2 puffs into the lungs every 6 (six) hours as needed for wheezing or shortness of breath. 06/25/22   Nita Sells, MD  amLODipine (NORVASC) 10 MG tablet TAKE 1 TABLET(10 MG) BY MOUTH DAILY Patient taking differently: Take 10 mg by mouth daily. 05/19/22   Adrian Prows, MD  azithromycin (ZITHROMAX) 250 MG tablet Finish  all meds 06/26/22   Nita Sells, MD  Brinzolamide-Brimonidine [facility]) 1-0.2 % SUSP Place 1-2 drops into both eyes daily.    [provider]  hydrochlorothiazide (HYDRODIURIL) 12.5 MG tablet Take 1 tablet (12.5 mg total) by mouth daily. 06/26/22   Nita Sells, MD  insulin aspart protamine - aspart (NOVOLOG 70/30 MIX) (70-30) 100 UNIT/ML FlexPen Inject 10 Units into the skin 2 (two) times daily with a meal. 06/25/22   Nita Sells, MD  isosorbide mononitrate (IMDUR) 60 MG 24 hr tablet TAKE 1 TABLET DAILY Patient taking differently: Take 60 mg by mouth daily. 09/15/21   Adrian Prows, MD  levalbuterol Penne Lash) 0.63 MG/3ML nebulizer solution Take 3 mLs (0.63 mg total) by nebulization every 6 (six) hours. 06/25/22   Nita Sells, MD  metFORMIN (GLUCOPHAGE) 1000 MG tablet Take 0.5 tablets (500 mg total) by mouth daily. 10/27/16   [name], MD  pantoprazole (PROTONIX) 40 MG tablet Take 1 tablet (40 mg total) by mouth daily. 05/08/22   Raiford Noble Latif, DO  predniSONE (DELTASONE) 20 MG tablet Take 3 tablets (60 mg total) by mouth daily before breakfast for 4 days, THEN 2 tablets (40 mg total) daily before breakfast for 4 days, THEN 1 tablet (20 mg total) daily before breakfast for 4 days. 06/26/22 07/08/22  Nita Sells, MD  rivaroxaban (XARELTO) 20 MG TABS tablet Take 1 tablet (20 mg total) by mouth daily with  supper. Patient taking differently: Take 20 mg by mouth daily with lunch. 06/03/22   Charlynne Cousins, MD  ROCKLATAN 0.02-0.005 % SOLN Place 1 drop into both eyes at bedtime. 11/24/19   [provider]  TYLENOL 8 HOUR ARTHRITIS PAIN 650 MG CR tablet Take 650-1,300 mg by mouth every 8 (eight) hours as needed for pain.    [provider]  umeclidinium-vilanterol (ANORO ELLIPTA) 62.5-25 MCG/ACT AEPB Inhale 1 puff into the lungs daily. 06/26/22   Nita Sells, MD  zinc sulfate 220 (50 Zn) MG capsule Take 1 capsule (220 mg total) by  mouth daily. Patient not taking: Reported on 06/23/2022 05/08/22   Raiford Noble Latif, DO      Allergies    Buspirone, Cephalexin, Micardis [telmisartan], Atorvastatin, Canagliflozin, Doxycycline hyclate, Lipitor [atorvastatin calcium], and Tramadol    Review of Systems   Review of Systems  Musculoskeletal:  Positive for back pain.  All other systems reviewed and are negative.   Physical Exam Updated Vital Signs BP (!) 142/75   Pulse 67   Temp 97.6 F (36.4 C) (Oral)   Resp (!) 22   SpO2 100%  Physical Exam Vitals and nursing note reviewed.  Constitutional:      General: She is not in acute distress.    Appearance: She is well-developed. She is not ill-appearing, toxic-appearing or diaphoretic.  HENT:     Head: Normocephalic and atraumatic.     Mouth/Throat:     Mouth: Mucous membranes are moist.  Eyes:     Extraocular Movements: Extraocular movements intact.     Conjunctiva/sclera: Conjunctivae normal.  Cardiovascular:     Rate and Rhythm: Normal rate and regular rhythm.     Heart sounds: No murmur heard. Pulmonary:     Effort: Pulmonary effort is normal. No tachypnea or respiratory distress.     Breath sounds: Normal breath sounds. No decreased breath sounds, wheezing, rhonchi or rales.     Comments: On 3 L of supplemental oxygen Chest:     Chest wall: No mass or tenderness.  Abdominal:     Palpations: Abdomen is soft.     Tenderness: There is no abdominal tenderness.  Musculoskeletal:        General: No swelling.     Cervical back: Normal range of motion and neck supple.       Back:     Right lower leg: No tenderness.     Left lower leg: No tenderness.     Comments: Tender lipoma on right side of mid back.  No overlying erythematous skin change.  No areas of fluctuance or induration.  Skin:    General: Skin is warm and dry.     Coloration: Skin is not cyanotic or pale.  Neurological:     General: No focal deficit present.     Mental Status: She is alert and  oriented to person, place, and time.  Psychiatric:        Mood and Affect: Mood normal.        Behavior: Behavior normal.     ED Results / Procedures / Treatments   Labs (all labs ordered are listed, but only abnormal results are displayed) Labs Reviewed - No data to display  EKG None  Radiology No results found.  Procedures Procedures  {Document cardiac monitor, telemetry assessment procedure when appropriate:1}  Medications Ordered in ED Medications - No data to display  ED Course/ Medical Decision Making/ A&P   {   Click here for ABCD2,  HEART and other calculatorsREFRESH Note before signing :1}                          Medical Decision Making Amount and/or Complexity of Data Reviewed Labs: ordered.  Risk Prescription drug management.   This patient presents to the ED for concern of ***, this involves an extensive number of treatment options, and is a complaint that carries with it a high risk of complications and morbidity.  The differential diagnosis includes ***   Co morbidities that complicate the patient evaluation  ***   Additional history obtained:  Additional history obtained from *** External records from outside source obtained and reviewed including ***   Lab Tests:  I Ordered, and personally interpreted labs.  The pertinent results include:  ***   Imaging Studies ordered:  I ordered imaging studies including ***  I independently visualized and interpreted imaging which showed *** I agree with the radiologist interpretation   Cardiac Monitoring: / EKG:  The patient was maintained on a cardiac monitor.  I personally viewed and interpreted the cardiac monitored which showed an underlying rhythm of: ***   Consultations Obtained:  I requested consultation with the ***,  and discussed lab and imaging findings as well as pertinent plan - they recommend: ***   Problem List / ED Course / Critical interventions / Medication  management  Patient presents for pain to area of chronic lipoma.  She denies any other current symptoms.  When asked of timeline of this pain, she states it has been several weeks.  Of note, patient was hospitalized last week for COPD exacerbation.  Roxicodone and lidocaine patch were ordered.  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  Will check lab work.  Currently, area of painful lipoma is nonerythematous.  Low suspicion for acute infection.***. I ordered medication including ***  for ***  Reevaluation of the patient after these medicines showed that the patient {resolved/improved/worsened:23923::"improved"} I have reviewed the patients home medicines and have made adjustments as needed   Social Determinants of Health:  ***   Test / Admission - Considered:  ***   {Document critical care time when appropriate:1} {Document review of labs and clinical decision tools ie heart score, Chads2Vasc2 etc:1}  {Document your independent review of radiology images, and any outside records:1} {Document your discussion with family members, caretakers, and with consultants:1} {Document social determinants of health affecting pt's care:1} {Document your decision making why or why not admission, treatments were needed:1} Final Clinical Impression(s) / ED Diagnoses Final diagnoses:  None    Rx / DC Orders ED Discharge Orders     None

## 2022-06-29 NOTE — ED Notes (Signed)
BG 403, last took insulin at 1830 10units before eating

## 2022-06-30 ENCOUNTER — Emergency Department (HOSPITAL_COMMUNITY): Payer: Medicare Other

## 2022-06-30 DIAGNOSIS — R0789 Other chest pain: Secondary | ICD-10-CM | POA: Diagnosis not present

## 2022-06-30 DIAGNOSIS — D171 Benign lipomatous neoplasm of skin and subcutaneous tissue of trunk: Secondary | ICD-10-CM | POA: Diagnosis not present

## 2022-06-30 LAB — CBG MONITORING, ED: Glucose-Capillary: 153 mg/dL — ABNORMAL HIGH (ref 70–99)

## 2022-06-30 LAB — COMPREHENSIVE METABOLIC PANEL
ALT: 12 U/L (ref 0–44)
AST: 18 U/L (ref 15–41)
Albumin: 3.1 g/dL — ABNORMAL LOW (ref 3.5–5.0)
Alkaline Phosphatase: 72 U/L (ref 38–126)
Anion gap: 9 (ref 5–15)
BUN: 39 mg/dL — ABNORMAL HIGH (ref 8–23)
CO2: 28 mmol/L (ref 22–32)
Calcium: 8.7 mg/dL — ABNORMAL LOW (ref 8.9–10.3)
Chloride: 96 mmol/L — ABNORMAL LOW (ref 98–111)
Creatinine, Ser: 1.02 mg/dL — ABNORMAL HIGH (ref 0.44–1.00)
GFR, Estimated: 53 mL/min — ABNORMAL LOW (ref >60)
Glucose, Bld: 376 mg/dL — ABNORMAL HIGH (ref 70–99)
Potassium: 4.6 mmol/L (ref 3.5–5.1)
Sodium: 133 mmol/L — ABNORMAL LOW (ref 135–145)
Total Bilirubin: 0.5 mg/dL (ref 0.3–1.2)
Total Protein: 6.5 g/dL (ref 6.5–8.1)

## 2022-06-30 LAB — MAGNESIUM: Magnesium: 1.5 mg/dL — ABNORMAL LOW (ref 1.7–2.4)

## 2022-06-30 MED ORDER — SODIUM CHLORIDE (PF) 0.9 % IJ SOLN
INTRAMUSCULAR | Status: AC
  Filled 2022-06-30: qty 50

## 2022-06-30 MED ORDER — KETOROLAC TROMETHAMINE 15 MG/ML IJ SOLN
15.0000 mg | Freq: Once | INTRAMUSCULAR | Status: AC
  Administered 2022-06-30: 15 mg via INTRAVENOUS
  Filled 2022-06-30: qty 1

## 2022-06-30 MED ORDER — IOHEXOL 350 MG/ML SOLN
80.0000 mL | Freq: Once | INTRAVENOUS | Status: AC | PRN
  Administered 2022-06-30: 75 mL via INTRAVENOUS

## 2022-06-30 MED ORDER — MAGNESIUM SULFATE 2 GM/50ML IV SOLN
2.0000 g | Freq: Once | INTRAVENOUS | Status: AC
  Administered 2022-06-30: 2 g via INTRAVENOUS
  Filled 2022-06-30: qty 50

## 2022-06-30 MED ORDER — LIDOCAINE 5 % EX PTCH: 1.0000 | MEDICATED_PATCH | CUTANEOUS | 0 refills | Status: DC

## 2022-06-30 MED ORDER — OXYCODONE-ACETAMINOPHEN 5-325 MG PO TABS: 1.0000 | ORAL_TABLET | Freq: Three times a day (TID) | ORAL | 0 refills | Status: AC | PRN

## 2022-06-30 MED ORDER — INSULIN ASPART 100 UNIT/ML IJ SOLN
10.0000 [IU] | Freq: Once | INTRAMUSCULAR | Status: AC
  Administered 2022-06-30: 10 [IU] via SUBCUTANEOUS
  Filled 2022-06-30: qty 0.1

## 2022-06-30 MED ORDER — LACTATED RINGERS IV BOLUS
500.0000 mL | Freq: Once | INTRAVENOUS | Status: AC
  Administered 2022-06-30: 500 mL via INTRAVENOUS

## 2022-06-30 NOTE — Discharge Instructions (Addendum)
There were prescriptions sent to your pharmacy.  Lidocaine patches can help provide some topical pain relief.  Avoid having more than 1 patch on your skin at any given time.  Percocet is a narcotic pain medication.  Take this only as needed and take with caution.  There is a telephone number below that you can call for a surgery follow-up.  Lipomas can be removed if they are causing you significant discomfort.  Return to the emergency department for any new or worsening symptoms of concern.

## 2022-07-02 DIAGNOSIS — I130 Hypertensive heart and chronic kidney disease with heart failure and stage 1 through stage 4 chronic kidney disease, or unspecified chronic kidney disease: Secondary | ICD-10-CM | POA: Diagnosis not present

## 2022-07-02 DIAGNOSIS — I509 Heart failure, unspecified: Secondary | ICD-10-CM | POA: Diagnosis not present

## 2022-07-02 DIAGNOSIS — U071 COVID-19: Secondary | ICD-10-CM | POA: Diagnosis not present

## 2022-07-02 DIAGNOSIS — J449 Chronic obstructive pulmonary disease, unspecified: Secondary | ICD-10-CM | POA: Diagnosis not present

## 2022-07-02 DIAGNOSIS — N179 Acute kidney failure, unspecified: Secondary | ICD-10-CM | POA: Diagnosis not present

## 2022-07-02 DIAGNOSIS — J9611 Chronic respiratory failure with hypoxia: Secondary | ICD-10-CM | POA: Diagnosis not present

## 2022-07-03 ENCOUNTER — Emergency Department (HOSPITAL_COMMUNITY): Payer: Medicare Other

## 2022-07-03 ENCOUNTER — Ambulatory Visit: Payer: Self-pay | Admitting: *Deleted

## 2022-07-03 ENCOUNTER — Encounter (HOSPITAL_COMMUNITY): Payer: Self-pay

## 2022-07-03 ENCOUNTER — Other Ambulatory Visit: Payer: Self-pay

## 2022-07-03 ENCOUNTER — Inpatient Hospital Stay (HOSPITAL_COMMUNITY)
Admission: EM | Admit: 2022-07-03 | Discharge: 2022-07-27 | DRG: 853 | Disposition: E | Payer: Medicare Other | Attending: Internal Medicine | Admitting: Internal Medicine

## 2022-07-03 DIAGNOSIS — R339 Retention of urine, unspecified: Secondary | ICD-10-CM | POA: Diagnosis not present

## 2022-07-03 DIAGNOSIS — Z6837 Body mass index (BMI) 37.0-37.9, adult: Secondary | ICD-10-CM | POA: Diagnosis not present

## 2022-07-03 DIAGNOSIS — R0602 Shortness of breath: Secondary | ICD-10-CM | POA: Diagnosis not present

## 2022-07-03 DIAGNOSIS — J9611 Chronic respiratory failure with hypoxia: Secondary | ICD-10-CM

## 2022-07-03 DIAGNOSIS — Z7189 Other specified counseling: Secondary | ICD-10-CM | POA: Diagnosis not present

## 2022-07-03 DIAGNOSIS — R7881 Bacteremia: Secondary | ICD-10-CM | POA: Insufficient documentation

## 2022-07-03 DIAGNOSIS — J811 Chronic pulmonary edema: Secondary | ICD-10-CM | POA: Diagnosis not present

## 2022-07-03 DIAGNOSIS — R109 Unspecified abdominal pain: Secondary | ICD-10-CM | POA: Diagnosis not present

## 2022-07-03 DIAGNOSIS — I5033 Acute on chronic diastolic (congestive) heart failure: Secondary | ICD-10-CM | POA: Diagnosis not present

## 2022-07-03 DIAGNOSIS — R54 Age-related physical debility: Secondary | ICD-10-CM | POA: Diagnosis present

## 2022-07-03 DIAGNOSIS — R739 Hyperglycemia, unspecified: Secondary | ICD-10-CM | POA: Diagnosis not present

## 2022-07-03 DIAGNOSIS — I4891 Unspecified atrial fibrillation: Secondary | ICD-10-CM | POA: Diagnosis present

## 2022-07-03 DIAGNOSIS — J9621 Acute and chronic respiratory failure with hypoxia: Secondary | ICD-10-CM | POA: Diagnosis not present

## 2022-07-03 DIAGNOSIS — Z86718 Personal history of other venous thrombosis and embolism: Secondary | ICD-10-CM | POA: Diagnosis not present

## 2022-07-03 DIAGNOSIS — R0902 Hypoxemia: Secondary | ICD-10-CM | POA: Diagnosis not present

## 2022-07-03 DIAGNOSIS — R451 Restlessness and agitation: Secondary | ICD-10-CM | POA: Diagnosis not present

## 2022-07-03 DIAGNOSIS — F419 Anxiety disorder, unspecified: Secondary | ICD-10-CM | POA: Diagnosis not present

## 2022-07-03 DIAGNOSIS — M47815 Spondylosis without myelopathy or radiculopathy, thoracolumbar region: Secondary | ICD-10-CM | POA: Diagnosis not present

## 2022-07-03 DIAGNOSIS — E669 Obesity, unspecified: Secondary | ICD-10-CM | POA: Diagnosis present

## 2022-07-03 DIAGNOSIS — J440 Chronic obstructive pulmonary disease with acute lower respiratory infection: Secondary | ICD-10-CM | POA: Diagnosis present

## 2022-07-03 DIAGNOSIS — G061 Intraspinal abscess and granuloma: Secondary | ICD-10-CM | POA: Diagnosis present

## 2022-07-03 DIAGNOSIS — D638 Anemia in other chronic diseases classified elsewhere: Secondary | ICD-10-CM | POA: Diagnosis present

## 2022-07-03 DIAGNOSIS — K5900 Constipation, unspecified: Secondary | ICD-10-CM | POA: Diagnosis not present

## 2022-07-03 DIAGNOSIS — J90 Pleural effusion, not elsewhere classified: Secondary | ICD-10-CM | POA: Diagnosis not present

## 2022-07-03 DIAGNOSIS — Z515 Encounter for palliative care: Secondary | ICD-10-CM | POA: Diagnosis not present

## 2022-07-03 DIAGNOSIS — Z1152 Encounter for screening for COVID-19: Secondary | ICD-10-CM

## 2022-07-03 DIAGNOSIS — M545 Low back pain, unspecified: Secondary | ICD-10-CM | POA: Diagnosis not present

## 2022-07-03 DIAGNOSIS — Z79891 Long term (current) use of opiate analgesic: Secondary | ICD-10-CM

## 2022-07-03 DIAGNOSIS — E785 Hyperlipidemia, unspecified: Secondary | ICD-10-CM | POA: Diagnosis present

## 2022-07-03 DIAGNOSIS — A4102 Sepsis due to Methicillin resistant Staphylococcus aureus: Principal | ICD-10-CM | POA: Diagnosis present

## 2022-07-03 DIAGNOSIS — J9622 Acute and chronic respiratory failure with hypercapnia: Secondary | ICD-10-CM | POA: Diagnosis not present

## 2022-07-03 DIAGNOSIS — Z7401 Bed confinement status: Secondary | ICD-10-CM

## 2022-07-03 DIAGNOSIS — Z823 Family history of stroke: Secondary | ICD-10-CM

## 2022-07-03 DIAGNOSIS — I080 Rheumatic disorders of both mitral and aortic valves: Secondary | ICD-10-CM | POA: Diagnosis not present

## 2022-07-03 DIAGNOSIS — Z66 Do not resuscitate: Secondary | ICD-10-CM | POA: Diagnosis not present

## 2022-07-03 DIAGNOSIS — E119 Type 2 diabetes mellitus without complications: Secondary | ICD-10-CM | POA: Diagnosis not present

## 2022-07-03 DIAGNOSIS — B9562 Methicillin resistant Staphylococcus aureus infection as the cause of diseases classified elsewhere: Secondary | ICD-10-CM | POA: Insufficient documentation

## 2022-07-03 DIAGNOSIS — J189 Pneumonia, unspecified organism: Principal | ICD-10-CM | POA: Diagnosis present

## 2022-07-03 DIAGNOSIS — J439 Emphysema, unspecified: Secondary | ICD-10-CM

## 2022-07-03 DIAGNOSIS — D649 Anemia, unspecified: Secondary | ICD-10-CM | POA: Diagnosis not present

## 2022-07-03 DIAGNOSIS — F05 Delirium due to known physiological condition: Secondary | ICD-10-CM | POA: Diagnosis not present

## 2022-07-03 DIAGNOSIS — E875 Hyperkalemia: Secondary | ICD-10-CM | POA: Diagnosis not present

## 2022-07-03 DIAGNOSIS — E871 Hypo-osmolality and hyponatremia: Secondary | ICD-10-CM | POA: Diagnosis not present

## 2022-07-03 DIAGNOSIS — I10 Essential (primary) hypertension: Secondary | ICD-10-CM | POA: Diagnosis present

## 2022-07-03 DIAGNOSIS — E1169 Type 2 diabetes mellitus with other specified complication: Secondary | ICD-10-CM | POA: Diagnosis present

## 2022-07-03 DIAGNOSIS — I110 Hypertensive heart disease with heart failure: Secondary | ICD-10-CM | POA: Diagnosis not present

## 2022-07-03 DIAGNOSIS — K5730 Diverticulosis of large intestine without perforation or abscess without bleeding: Secondary | ICD-10-CM | POA: Diagnosis not present

## 2022-07-03 DIAGNOSIS — D179 Benign lipomatous neoplasm, unspecified: Secondary | ICD-10-CM | POA: Diagnosis present

## 2022-07-03 DIAGNOSIS — Z7984 Long term (current) use of oral hypoglycemic drugs: Secondary | ICD-10-CM

## 2022-07-03 DIAGNOSIS — Z9981 Dependence on supplemental oxygen: Secondary | ICD-10-CM

## 2022-07-03 DIAGNOSIS — Z794 Long term (current) use of insulin: Secondary | ICD-10-CM

## 2022-07-03 DIAGNOSIS — E861 Hypovolemia: Secondary | ICD-10-CM | POA: Diagnosis not present

## 2022-07-03 DIAGNOSIS — Z79899 Other long term (current) drug therapy: Secondary | ICD-10-CM

## 2022-07-03 DIAGNOSIS — Z881 Allergy status to other antibiotic agents status: Secondary | ICD-10-CM

## 2022-07-03 DIAGNOSIS — M4620 Osteomyelitis of vertebra, site unspecified: Secondary | ICD-10-CM | POA: Diagnosis not present

## 2022-07-03 DIAGNOSIS — Z7951 Long term (current) use of inhaled steroids: Secondary | ICD-10-CM

## 2022-07-03 DIAGNOSIS — I700 Atherosclerosis of aorta: Secondary | ICD-10-CM | POA: Diagnosis not present

## 2022-07-03 DIAGNOSIS — J441 Chronic obstructive pulmonary disease with (acute) exacerbation: Secondary | ICD-10-CM | POA: Diagnosis present

## 2022-07-03 DIAGNOSIS — J449 Chronic obstructive pulmonary disease, unspecified: Secondary | ICD-10-CM | POA: Diagnosis present

## 2022-07-03 DIAGNOSIS — G062 Extradural and subdural abscess, unspecified: Secondary | ICD-10-CM | POA: Diagnosis present

## 2022-07-03 DIAGNOSIS — I5032 Chronic diastolic (congestive) heart failure: Secondary | ICD-10-CM | POA: Diagnosis present

## 2022-07-03 DIAGNOSIS — I509 Heart failure, unspecified: Secondary | ICD-10-CM | POA: Diagnosis not present

## 2022-07-03 DIAGNOSIS — R059 Cough, unspecified: Secondary | ICD-10-CM | POA: Diagnosis not present

## 2022-07-03 DIAGNOSIS — Z7901 Long term (current) use of anticoagulants: Secondary | ICD-10-CM

## 2022-07-03 DIAGNOSIS — R680 Hypothermia, not associated with low environmental temperature: Secondary | ICD-10-CM | POA: Diagnosis not present

## 2022-07-03 DIAGNOSIS — N281 Cyst of kidney, acquired: Secondary | ICD-10-CM | POA: Diagnosis not present

## 2022-07-03 DIAGNOSIS — I440 Atrioventricular block, first degree: Secondary | ICD-10-CM | POA: Diagnosis present

## 2022-07-03 LAB — COMPREHENSIVE METABOLIC PANEL
ALT: 23 U/L (ref 0–44)
AST: 18 U/L (ref 15–41)
Albumin: 2.9 g/dL — ABNORMAL LOW (ref 3.5–5.0)
Alkaline Phosphatase: 86 U/L (ref 38–126)
Anion gap: 10 (ref 5–15)
BUN: 28 mg/dL — ABNORMAL HIGH (ref 8–23)
CO2: 27 mmol/L (ref 22–32)
Calcium: 8.2 mg/dL — ABNORMAL LOW (ref 8.9–10.3)
Chloride: 87 mmol/L — ABNORMAL LOW (ref 98–111)
Creatinine, Ser: 1.07 mg/dL — ABNORMAL HIGH (ref 0.44–1.00)
GFR, Estimated: 50 mL/min — ABNORMAL LOW (ref >60)
Glucose, Bld: 292 mg/dL — ABNORMAL HIGH (ref 70–99)
Potassium: 4.3 mmol/L (ref 3.5–5.1)
Sodium: 124 mmol/L — ABNORMAL LOW (ref 135–145)
Total Bilirubin: 1.5 mg/dL — ABNORMAL HIGH (ref 0.3–1.2)
Total Protein: 6.7 g/dL (ref 6.5–8.1)

## 2022-07-03 LAB — CBC WITH DIFFERENTIAL/PLATELET
Abs Immature Granulocytes: 0.15 10*3/uL — ABNORMAL HIGH (ref 0.00–0.07)
Basophils Absolute: 0.1 10*3/uL (ref 0.0–0.1)
Basophils Relative: 0 %
Eosinophils Absolute: 0.1 10*3/uL (ref 0.0–0.5)
Eosinophils Relative: 0 %
HCT: 35.5 % — ABNORMAL LOW (ref 36.0–46.0)
Hemoglobin: 11.6 g/dL — ABNORMAL LOW (ref 12.0–15.0)
Immature Granulocytes: 1 %
Lymphocytes Relative: 2 %
Lymphs Abs: 0.6 10*3/uL — ABNORMAL LOW (ref 0.7–4.0)
MCH: 28.6 pg (ref 26.0–34.0)
MCHC: 32.7 g/dL (ref 30.0–36.0)
MCV: 87.4 fL (ref 80.0–100.0)
Monocytes Absolute: 0.9 10*3/uL (ref 0.1–1.0)
Monocytes Relative: 4 %
Neutro Abs: 25.3 10*3/uL — ABNORMAL HIGH (ref 1.7–7.7)
Neutrophils Relative %: 93 %
Platelets: UNDETERMINED 10*3/uL (ref 150–400)
RBC: 4.06 MIL/uL (ref 3.87–5.11)
RDW: 15 % (ref 11.5–15.5)
WBC: 27.1 10*3/uL — ABNORMAL HIGH (ref 4.0–10.5)
nRBC: 0 % (ref 0.0–0.2)

## 2022-07-03 MED ORDER — ACETAMINOPHEN 500 MG PO TABS
1000.0000 mg | ORAL_TABLET | Freq: Once | ORAL | Status: AC
  Administered 2022-07-03: 1000 mg via ORAL
  Filled 2022-07-03: qty 2

## 2022-07-03 MED ORDER — FENTANYL CITRATE PF 50 MCG/ML IJ SOSY
50.0000 ug | PREFILLED_SYRINGE | Freq: Once | INTRAMUSCULAR | Status: AC
  Administered 2022-07-03: 50 ug via INTRAVENOUS
  Filled 2022-07-03: qty 1

## 2022-07-03 NOTE — ED Provider Notes (Addendum)
[facility] Provider Note   CSN: [id number] Arrival date & time: 07/03/22  2127     History  Chief Complaint  Patient presents with   Constipation    [name] is a 89 y.o. female.   Constipation Patient [name] for constipation.  Reportedly has had no bowel movement since Monday with today being Friday.  Has had 2 enemas by family.  Reportedly had a little bit of soft stool come out with the enema.  However abdomen is more distended and tender.  Patient states she needs pain medicines.  Has been on narcotic pain medicine however for her lipoma on her back.  Reportedly had a lipoma there for 40 years.  Has had constipation since using more pain meds.  No fevers.    Past Medical History:  Diagnosis Date   Cellulitis and abscess of left leg    CHF (congestive heart failure) (HCC)    COPD (chronic obstructive pulmonary disease) (HCC)    Diabetes mellitus    Glaucoma    Hyperlipidemia    Hypertension     Home Medications Prior to Admission medications   Medication Sig Start Date End Date Taking? Authorizing Provider  acetaminophen (TYLENOL) 325 MG tablet Take 2 tablets (650 mg total) by mouth every 6 (six) hours as needed for mild pain (or Fever >/= 101). Patient not taking: Reported on 06/23/2022 05/07/22   Raiford Noble Latif, DO  albuterol (VENTOLIN HFA) 108 [phone] Base) MCG/ACT inhaler Inhale 2 puffs into the lungs every 6 (six) hours as needed for wheezing or shortness of breath. 06/25/22   Nita Sells, MD  amLODipine (NORVASC) 10 MG tablet TAKE 1 TABLET(10 MG) BY MOUTH DAILY Patient taking differently: Take 10 mg by mouth daily. 05/19/22   Adrian Prows, MD  azithromycin (ZITHROMAX) 250 MG tablet Finish all meds 06/26/22   Nita Sells, MD  Brinzolamide-Brimonidine [facility]) 1-0.2 % SUSP Place 1-2 drops into both eyes daily.    [provider]  hydrochlorothiazide (HYDRODIURIL) 12.5 MG tablet Take 1 tablet  (12.5 mg total) by mouth daily. 06/26/22   Nita Sells, MD  insulin aspart protamine - aspart (NOVOLOG 70/30 MIX) (70-30) 100 UNIT/ML FlexPen Inject 10 Units into the skin 2 (two) times daily with a meal. 06/25/22   Nita Sells, MD  isosorbide mononitrate (IMDUR) 60 MG 24 hr tablet TAKE 1 TABLET DAILY Patient taking differently: Take 60 mg by mouth daily. 09/15/21   Adrian Prows, MD  levalbuterol Penne Lash) 0.63 MG/3ML nebulizer solution Take 3 mLs (0.63 mg total) by nebulization every 6 (six) hours. 06/25/22   Nita Sells, MD  lidocaine (LIDODERM) 5 % Place 1 patch onto the skin daily. Remove & Discard patch within 12 hours or as directed by MD 06/30/22   Godfrey Pick, MD  metFORMIN (GLUCOPHAGE) 1000 MG tablet Take 0.5 tablets (500 mg total) by mouth daily. 10/27/16   [name], MD  oxyCODONE-acetaminophen (PERCOCET/ROXICET) 5-325 MG tablet Take 1 tablet by mouth every 8 (eight) hours as needed for up to 3 days for severe pain. 06/30/22 07/03/22  Godfrey Pick, MD  pantoprazole (PROTONIX) 40 MG tablet Take 1 tablet (40 mg total) by mouth daily. 05/08/22   Raiford Noble Latif, DO  predniSONE (DELTASONE) 20 MG tablet Take 3 tablets (60 mg total) by mouth daily before breakfast for 4 days, THEN 2 tablets (40 mg total) daily before breakfast for 4 days, THEN 1 tablet (20 mg total) daily before breakfast for 4 days.  06/26/22 07/08/22  Nita Sells, MD  rivaroxaban (XARELTO) 20 MG TABS tablet Take 1 tablet (20 mg total) by mouth daily with supper. Patient taking differently: Take 20 mg by mouth daily with lunch. 06/03/22   Charlynne Cousins, MD  ROCKLATAN 0.02-0.005 % SOLN Place 1 drop into both eyes at bedtime. 11/24/19   [provider]  TYLENOL 8 HOUR ARTHRITIS PAIN 650 MG CR tablet Take 650-1,300 mg by mouth every 8 (eight) hours as needed for pain.    [provider]  umeclidinium-vilanterol (ANORO ELLIPTA) 62.5-25 MCG/ACT AEPB Inhale 1 puff into the lungs daily.  06/26/22   Nita Sells, MD  zinc sulfate 220 (50 Zn) MG capsule Take 1 capsule (220 mg total) by mouth daily. Patient not taking: Reported on 06/23/2022 05/08/22   Raiford Noble Latif, DO      Allergies    Buspirone, Cephalexin, Micardis [telmisartan], Atorvastatin, Canagliflozin, Doxycycline hyclate, Lipitor [atorvastatin calcium], and Tramadol    Review of Systems   Review of Systems  Gastrointestinal:  Positive for constipation.    Physical Exam Updated Vital Signs BP (!) 140/68 (BP Location: Right Arm)   Pulse 98   Temp (!) 100.5 F (38.1 C) (Rectal)   Resp 16   SpO2 95%  Physical Exam Vitals and nursing note reviewed.  HENT:     Head: Normocephalic.  Cardiovascular:     Rate and Rhythm: Regular rhythm.  Abdominal:     General: There is distension.     Tenderness: There is abdominal tenderness.     Comments: Diffuse abdominal tenderness.  Musculoskeletal:     Comments: There is a lipoma on the right lower back below a lidocaine patch.  Somewhat tender at this spot.  Skin:    General: Skin is warm.     Comments: Patient feels warm on her back.  Neurological:     Mental Status: She is alert and oriented to person, place, and time.     ED Results / Procedures / Treatments   Labs (all labs ordered are listed, but only abnormal results are displayed) Labs Reviewed  CBC WITH DIFFERENTIAL/PLATELET - Abnormal; Notable for the following components:      Result Value   WBC 27.1 (*)    Hemoglobin 11.6 (*)    HCT 35.5 (*)    Neutro Abs 25.3 (*)    Lymphs Abs 0.6 (*)    Abs Immature Granulocytes 0.15 (*)    All other components within normal limits  RESP PANEL BY RT-PCR (RSV, FLU A&B, COVID)  [id number]  COMPREHENSIVE METABOLIC PANEL  URINALYSIS, ROUTINE W REFLEX MICROSCOPIC    EKG None  Radiology DG Abdomen 1 View  Result Date: 07/03/2022 CLINICAL DATA:  constipation EXAM: ABDOMEN - 1 VIEW COMPARISON:  CT angio chest 06/30/2022, CT abdomen pelvis 11/14/2010  FINDINGS: Limited evaluation due to overlapping osseous structures and overlying soft tissues. Stool throughout the ascending colon. The bowel gas pattern is normal. Calcified splenic artery within left upper quadrant again noted. No radio-opaque calculi or other significant radiographic abnormality are seen. Surgical tacks related to a ventral wall hernia repair noted overlying the abdomen and pelvis. IMPRESSION: Nonobstructive bowel gas pattern. Electronically Signed   By: Iven Finn M.D.   On: 07/03/2022 23:09    Procedures Procedures    Medications Ordered in ED Medications  acetaminophen (TYLENOL) tablet 1,000 mg (has no administration in time range)  fentaNYL (SUBLIMAZE) injection 50 mcg (50 mcg Intravenous Given 07/03/22 2330)    ED Course/  Medical Decision Making/ A&P                             Medical Decision Making Amount and/or Complexity of Data Reviewed Labs: ordered. Radiology: ordered.  Risk Prescription drug management.   Patient abdominal pain.  Will get rectal temperature due to to oral temperature being 99.1.  Will get basic blood work.  X-ray done and did show some constipation but no obstruction.  With the mild tenderness will get CT scan.  Care turned over to Dr. Tyrone Nine.  Found to have fever.  Will add urinalysis.        Final Clinical Impression(s) / ED Diagnoses Final diagnoses:  None    Rx / DC Orders ED Discharge Orders     None         Davonna Belling, MD 07/03/22 [id number]    Davonna Belling, MD 07/03/22 [phone]

## 2022-07-03 NOTE — Telephone Encounter (Signed)
  Chief Complaint: constipation  Symptoms: no BM since Sunday 06/28/22 after taking oxycodone for pain. Abdominal swelling bloating of abdomen. No appetite no vomiting crying in pain Frequency: no BM since Sunday 06/28/22.  Pertinent Negatives: Patient denies fever, no rectal bleeding no vomiting  Disposition: [x] ED /[] Urgent Care (no appt availability in office) / [] Appointment(In office/virtual)/ []  [place] [facility]/ [] Home Care/ [] Refused Recommended Disposition /[] [facility] [facility]/ []  Follow-up with PCP Additional Notes:   Recommended ED now .     Reason for Disposition  Patient sounds very sick or weak to the triager  Answer Assessment - Initial Assessment Questions 1. STOOL PATTERN OR FREQUENCY: "How often do you have a bowel movement (BM)?"  (Normal range: 3 times a day to every 3 days)  "When was your last BM?"       Couple of times a day per daughter that is not with patient at this time. 2. STRAINING: "Do you have to strain to have a BM?"      Unable to have BM but crying out in pain 3. RECTAL PAIN: "Does your rectum hurt when the stool comes out?" If Yes, ask: "Do you have hemorrhoids? How bad is the pain?"  (Scale 1-10; or mild, moderate, severe)     Na  4. STOOL COMPOSITION: "Are the stools hard?"      none 5. BLOOD ON STOOLS: "Has there been any blood on the toilet tissue or on the surface of the BM?" If Yes, ask: "When was the last time?"     na 6. CHRONIC CONSTIPATION: "Is this a new problem for you?"  If No, ask: "How long have you had this problem?" (days, weeks, months)      Has had hemorrhoids in the past  7. CHANGES IN DIET OR HYDRATION: "Have there been any recent changes in your diet?" "How much fluids are you drinking on a daily basis?"  "How much have you had to drink today?"     Not tolerated food at this time 8. MEDICINES: "Have you been taking any new medicines?" "Are you taking any narcotic pain medicines?" (e.g., Dilaudid, morphine, Percocet,  Vicodin)     Yes oxycodone 9. LAXATIVES: "Have you been using any stool softeners, laxatives, or enemas?"  If Yes, ask "What, how often, and when was the last time?"     Stool softeners and enemas x 1 today  10. ACTIVITY:  "How much walking do you do every day?"  "Has your activity level decreased in the past week?"        Na  11. CAUSE: "What do you think is causing the constipation?"        Taking narcotics 12. OTHER SYMPTOMS: "Do you have any other symptoms?" (e.g., abdomen pain, bloating, fever, vomiting)       Bloating , abdominal pain back pain no appetite. No vomiting  13. MEDICAL HISTORY: "Do you have a history of hemorrhoids, rectal fissures, or rectal surgery or rectal abscess?"         Yes hemorrhoids 14. PREGNANCY: "Is there any chance you are pregnant?" "When was your last menstrual period?"       na  Protocols used: Constipation-A-AH

## 2022-07-03 NOTE — ED Triage Notes (Signed)
Pt arrived via EMS from home for abdominal pain, no bowel movement for 6 days. 10/10 pain.

## 2022-07-04 ENCOUNTER — Emergency Department (HOSPITAL_COMMUNITY): Payer: Medicare Other

## 2022-07-04 ENCOUNTER — Inpatient Hospital Stay (HOSPITAL_COMMUNITY): Payer: Medicare Other

## 2022-07-04 ENCOUNTER — Encounter (HOSPITAL_COMMUNITY): Payer: Self-pay | Admitting: Internal Medicine

## 2022-07-04 DIAGNOSIS — J9611 Chronic respiratory failure with hypoxia: Secondary | ICD-10-CM | POA: Diagnosis not present

## 2022-07-04 DIAGNOSIS — J440 Chronic obstructive pulmonary disease with acute lower respiratory infection: Secondary | ICD-10-CM | POA: Diagnosis present

## 2022-07-04 DIAGNOSIS — M47815 Spondylosis without myelopathy or radiculopathy, thoracolumbar region: Secondary | ICD-10-CM | POA: Diagnosis not present

## 2022-07-04 DIAGNOSIS — R0902 Hypoxemia: Secondary | ICD-10-CM | POA: Diagnosis not present

## 2022-07-04 DIAGNOSIS — E119 Type 2 diabetes mellitus without complications: Secondary | ICD-10-CM

## 2022-07-04 DIAGNOSIS — I4891 Unspecified atrial fibrillation: Secondary | ICD-10-CM | POA: Diagnosis present

## 2022-07-04 DIAGNOSIS — B9562 Methicillin resistant Staphylococcus aureus infection as the cause of diseases classified elsewhere: Secondary | ICD-10-CM | POA: Diagnosis not present

## 2022-07-04 DIAGNOSIS — I5032 Chronic diastolic (congestive) heart failure: Secondary | ICD-10-CM | POA: Diagnosis not present

## 2022-07-04 DIAGNOSIS — E785 Hyperlipidemia, unspecified: Secondary | ICD-10-CM | POA: Diagnosis present

## 2022-07-04 DIAGNOSIS — I080 Rheumatic disorders of both mitral and aortic valves: Secondary | ICD-10-CM | POA: Diagnosis not present

## 2022-07-04 DIAGNOSIS — Z86718 Personal history of other venous thrombosis and embolism: Secondary | ICD-10-CM

## 2022-07-04 DIAGNOSIS — R0602 Shortness of breath: Secondary | ICD-10-CM | POA: Diagnosis not present

## 2022-07-04 DIAGNOSIS — I110 Hypertensive heart disease with heart failure: Secondary | ICD-10-CM | POA: Diagnosis present

## 2022-07-04 DIAGNOSIS — I509 Heart failure, unspecified: Secondary | ICD-10-CM | POA: Diagnosis not present

## 2022-07-04 DIAGNOSIS — E871 Hypo-osmolality and hyponatremia: Secondary | ICD-10-CM

## 2022-07-04 DIAGNOSIS — I10 Essential (primary) hypertension: Secondary | ICD-10-CM | POA: Diagnosis not present

## 2022-07-04 DIAGNOSIS — Z7189 Other specified counseling: Secondary | ICD-10-CM | POA: Diagnosis not present

## 2022-07-04 DIAGNOSIS — D638 Anemia in other chronic diseases classified elsewhere: Secondary | ICD-10-CM | POA: Diagnosis present

## 2022-07-04 DIAGNOSIS — N281 Cyst of kidney, acquired: Secondary | ICD-10-CM | POA: Diagnosis not present

## 2022-07-04 DIAGNOSIS — Z1152 Encounter for screening for COVID-19: Secondary | ICD-10-CM | POA: Diagnosis not present

## 2022-07-04 DIAGNOSIS — J189 Pneumonia, unspecified organism: Secondary | ICD-10-CM

## 2022-07-04 DIAGNOSIS — G061 Intraspinal abscess and granuloma: Secondary | ICD-10-CM | POA: Diagnosis present

## 2022-07-04 DIAGNOSIS — R059 Cough, unspecified: Secondary | ICD-10-CM | POA: Diagnosis not present

## 2022-07-04 DIAGNOSIS — Z794 Long term (current) use of insulin: Secondary | ICD-10-CM | POA: Diagnosis not present

## 2022-07-04 DIAGNOSIS — M545 Low back pain, unspecified: Secondary | ICD-10-CM | POA: Diagnosis not present

## 2022-07-04 DIAGNOSIS — J441 Chronic obstructive pulmonary disease with (acute) exacerbation: Secondary | ICD-10-CM | POA: Diagnosis present

## 2022-07-04 DIAGNOSIS — M4620 Osteomyelitis of vertebra, site unspecified: Secondary | ICD-10-CM | POA: Diagnosis not present

## 2022-07-04 DIAGNOSIS — J9622 Acute and chronic respiratory failure with hypercapnia: Secondary | ICD-10-CM | POA: Diagnosis not present

## 2022-07-04 DIAGNOSIS — J811 Chronic pulmonary edema: Secondary | ICD-10-CM | POA: Diagnosis not present

## 2022-07-04 DIAGNOSIS — A4102 Sepsis due to Methicillin resistant Staphylococcus aureus: Secondary | ICD-10-CM | POA: Diagnosis present

## 2022-07-04 DIAGNOSIS — R109 Unspecified abdominal pain: Secondary | ICD-10-CM | POA: Diagnosis not present

## 2022-07-04 DIAGNOSIS — E875 Hyperkalemia: Secondary | ICD-10-CM | POA: Diagnosis not present

## 2022-07-04 DIAGNOSIS — Z66 Do not resuscitate: Secondary | ICD-10-CM | POA: Diagnosis not present

## 2022-07-04 DIAGNOSIS — I5033 Acute on chronic diastolic (congestive) heart failure: Secondary | ICD-10-CM | POA: Diagnosis not present

## 2022-07-04 DIAGNOSIS — F05 Delirium due to known physiological condition: Secondary | ICD-10-CM | POA: Diagnosis not present

## 2022-07-04 DIAGNOSIS — G062 Extradural and subdural abscess, unspecified: Secondary | ICD-10-CM | POA: Diagnosis present

## 2022-07-04 DIAGNOSIS — K5730 Diverticulosis of large intestine without perforation or abscess without bleeding: Secondary | ICD-10-CM | POA: Diagnosis not present

## 2022-07-04 DIAGNOSIS — Z515 Encounter for palliative care: Secondary | ICD-10-CM | POA: Diagnosis not present

## 2022-07-04 DIAGNOSIS — I700 Atherosclerosis of aorta: Secondary | ICD-10-CM | POA: Diagnosis not present

## 2022-07-04 DIAGNOSIS — J439 Emphysema, unspecified: Secondary | ICD-10-CM | POA: Diagnosis not present

## 2022-07-04 DIAGNOSIS — R7881 Bacteremia: Secondary | ICD-10-CM | POA: Diagnosis not present

## 2022-07-04 DIAGNOSIS — J90 Pleural effusion, not elsewhere classified: Secondary | ICD-10-CM | POA: Diagnosis not present

## 2022-07-04 DIAGNOSIS — D649 Anemia, unspecified: Secondary | ICD-10-CM | POA: Diagnosis not present

## 2022-07-04 DIAGNOSIS — E1169 Type 2 diabetes mellitus with other specified complication: Secondary | ICD-10-CM | POA: Diagnosis present

## 2022-07-04 DIAGNOSIS — Z6837 Body mass index (BMI) 37.0-37.9, adult: Secondary | ICD-10-CM | POA: Diagnosis not present

## 2022-07-04 DIAGNOSIS — E861 Hypovolemia: Secondary | ICD-10-CM | POA: Diagnosis not present

## 2022-07-04 DIAGNOSIS — R54 Age-related physical debility: Secondary | ICD-10-CM | POA: Diagnosis present

## 2022-07-04 DIAGNOSIS — J9621 Acute and chronic respiratory failure with hypoxia: Secondary | ICD-10-CM | POA: Diagnosis not present

## 2022-07-04 LAB — RESP PANEL BY RT-PCR (RSV, FLU A&B, COVID)  RVPGX2
Influenza A by PCR: NEGATIVE
Influenza B by PCR: NEGATIVE
Resp Syncytial Virus by PCR: NEGATIVE
SARS Coronavirus 2 by RT PCR: NEGATIVE

## 2022-07-04 LAB — URINALYSIS, ROUTINE W REFLEX MICROSCOPIC
Bacteria, UA: NONE SEEN
Bilirubin Urine: NEGATIVE
Glucose, UA: NEGATIVE mg/dL
Ketones, ur: 5 mg/dL — AB
Nitrite: NEGATIVE
Protein, ur: 30 mg/dL — AB
Specific Gravity, Urine: >1.046 — ABNORMAL HIGH (ref 1.005–1.030)
pH: 6 (ref 5.0–8.0)

## 2022-07-04 LAB — CBG MONITORING, ED: Glucose-Capillary: 246 mg/dL — ABNORMAL HIGH (ref 70–99)

## 2022-07-04 LAB — CBC WITH DIFFERENTIAL/PLATELET
Abs Immature Granulocytes: 0.2 10*3/uL — ABNORMAL HIGH (ref 0.00–0.07)
Basophils Absolute: 0.1 10*3/uL (ref 0.0–0.1)
Basophils Relative: 0 %
Eosinophils Absolute: 0 10*3/uL (ref 0.0–0.5)
Eosinophils Relative: 0 %
HCT: 34.7 % — ABNORMAL LOW (ref 36.0–46.0)
Hemoglobin: 11 g/dL — ABNORMAL LOW (ref 12.0–15.0)
Immature Granulocytes: 1 %
Lymphocytes Relative: 4 %
Lymphs Abs: 1 10*3/uL (ref 0.7–4.0)
MCH: 28.1 pg (ref 26.0–34.0)
MCHC: 31.7 g/dL (ref 30.0–36.0)
MCV: 88.7 fL (ref 80.0–100.0)
Monocytes Absolute: 1.1 10*3/uL — ABNORMAL HIGH (ref 0.1–1.0)
Monocytes Relative: 4 %
Neutro Abs: 23.2 10*3/uL — ABNORMAL HIGH (ref 1.7–7.7)
Neutrophils Relative %: 91 %
Platelets: UNDETERMINED 10*3/uL (ref 150–400)
RBC: 3.91 MIL/uL (ref 3.87–5.11)
RDW: 15.1 % (ref 11.5–15.5)
WBC: 25.5 10*3/uL — ABNORMAL HIGH (ref 4.0–10.5)
nRBC: 0 % (ref 0.0–0.2)

## 2022-07-04 LAB — COMPREHENSIVE METABOLIC PANEL
ALT: 20 U/L (ref 0–44)
AST: 14 U/L — ABNORMAL LOW (ref 15–41)
Albumin: 2.9 g/dL — ABNORMAL LOW (ref 3.5–5.0)
Alkaline Phosphatase: 87 U/L (ref 38–126)
Anion gap: 12 (ref 5–15)
BUN: 25 mg/dL — ABNORMAL HIGH (ref 8–23)
CO2: 25 mmol/L (ref 22–32)
Calcium: 8.1 mg/dL — ABNORMAL LOW (ref 8.9–10.3)
Chloride: 85 mmol/L — ABNORMAL LOW (ref 98–111)
Creatinine, Ser: 1 mg/dL (ref 0.44–1.00)
GFR, Estimated: 54 mL/min — ABNORMAL LOW (ref >60)
Glucose, Bld: 239 mg/dL — ABNORMAL HIGH (ref 70–99)
Potassium: 3.8 mmol/L (ref 3.5–5.1)
Sodium: 122 mmol/L — ABNORMAL LOW (ref 135–145)
Total Bilirubin: 1.1 mg/dL (ref 0.3–1.2)
Total Protein: 6.3 g/dL — ABNORMAL LOW (ref 6.5–8.1)

## 2022-07-04 LAB — MAGNESIUM
Magnesium: 1.3 mg/dL — ABNORMAL LOW (ref 1.7–2.4)
Magnesium: 1.3 mg/dL — ABNORMAL LOW (ref 1.7–2.4)

## 2022-07-04 LAB — SODIUM, URINE, RANDOM: Sodium, Ur: 75 mmol/L

## 2022-07-04 LAB — PROCALCITONIN: Procalcitonin: 0.74 ng/mL

## 2022-07-04 LAB — OSMOLALITY: Osmolality: 282 mOsm/kg (ref 275–295)

## 2022-07-04 LAB — GLUCOSE, CAPILLARY
Glucose-Capillary: 191 mg/dL — ABNORMAL HIGH (ref 70–99)
Glucose-Capillary: 170 mg/dL — ABNORMAL HIGH (ref 70–99)
Glucose-Capillary: 212 mg/dL — ABNORMAL HIGH (ref 70–99)

## 2022-07-04 LAB — LACTIC ACID, PLASMA: Lactic Acid, Venous: 1.2 mmol/L (ref 0.5–1.9)

## 2022-07-04 LAB — CREATININE, URINE, RANDOM: Creatinine, Urine: 43 mg/dL

## 2022-07-04 LAB — STREP PNEUMONIAE URINARY ANTIGEN: Strep Pneumo Urinary Antigen: NEGATIVE

## 2022-07-04 LAB — PHOSPHORUS: Phosphorus: 3.5 mg/dL (ref 2.5–4.6)

## 2022-07-04 LAB — BRAIN NATRIURETIC PEPTIDE: B Natriuretic Peptide: 197.2 pg/mL — ABNORMAL HIGH (ref 0.0–100.0)

## 2022-07-04 LAB — OSMOLALITY, URINE: Osmolality, Ur: 526 mOsm/kg (ref 300–900)

## 2022-07-04 LAB — URIC ACID: Uric Acid, Serum: 6.4 mg/dL (ref 2.5–7.1)

## 2022-07-04 LAB — TSH: TSH: 0.879 u[IU]/mL (ref 0.350–4.500)

## 2022-07-04 MED ORDER — ACETAMINOPHEN 325 MG PO TABS
650.0000 mg | ORAL_TABLET | Freq: Four times a day (QID) | ORAL | Status: DC | PRN
  Administered 2022-07-04 – 2022-07-05 (×2): 650 mg via ORAL
  Filled 2022-07-04 (×2): qty 2

## 2022-07-04 MED ORDER — IPRATROPIUM-ALBUTEROL 0.5-2.5 (3) MG/3ML IN SOLN
3.0000 mL | Freq: Four times a day (QID) | RESPIRATORY_TRACT | Status: DC | PRN
  Administered 2022-07-05: 3 mL via RESPIRATORY_TRACT
  Filled 2022-07-04: qty 3

## 2022-07-04 MED ORDER — INSULIN GLARGINE-YFGN 100 UNIT/ML ~~LOC~~ SOLN
8.0000 [IU] | Freq: Two times a day (BID) | SUBCUTANEOUS | Status: DC
  Administered 2022-07-04 – 2022-07-06 (×6): 8 [IU] via SUBCUTANEOUS
  Filled 2022-07-04 (×10): qty 0.08

## 2022-07-04 MED ORDER — SODIUM CHLORIDE 0.9 % IV SOLN: INTRAVENOUS | Status: DC

## 2022-07-04 MED ORDER — ACETAMINOPHEN 650 MG RE SUPP: 650.0000 mg | Freq: Four times a day (QID) | RECTAL | Status: DC | PRN

## 2022-07-04 MED ORDER — PREDNISONE 20 MG PO TABS
20.0000 mg | ORAL_TABLET | Freq: Every day | ORAL | Status: DC
  Administered 2022-07-04: 20 mg via ORAL
  Filled 2022-07-04: qty 1

## 2022-07-04 MED ORDER — SODIUM CHLORIDE 0.9 % IV SOLN
2.0000 g | INTRAVENOUS | Status: DC
  Administered 2022-07-05: 2 g via INTRAVENOUS
  Filled 2022-07-04: qty 20

## 2022-07-04 MED ORDER — SODIUM CHLORIDE 0.9 % IV SOLN
500.0000 mg | INTRAVENOUS | Status: DC
  Administered 2022-07-04: 500 mg via INTRAVENOUS
  Filled 2022-07-04 (×2): qty 5

## 2022-07-04 MED ORDER — MAGNESIUM SULFATE 2 GM/50ML IV SOLN
2.0000 g | Freq: Once | INTRAVENOUS | Status: AC
  Administered 2022-07-04: 2 g via INTRAVENOUS
  Filled 2022-07-04: qty 50

## 2022-07-04 MED ORDER — UMECLIDINIUM-VILANTEROL 62.5-25 MCG/ACT IN AEPB
1.0000 | INHALATION_SPRAY | Freq: Every day | RESPIRATORY_TRACT | Status: DC
  Administered 2022-07-08 – 2022-07-19 (×6): 1 via RESPIRATORY_TRACT
  Filled 2022-07-04 (×3): qty 14

## 2022-07-04 MED ORDER — PANTOPRAZOLE SODIUM 40 MG PO TBEC
40.0000 mg | DELAYED_RELEASE_TABLET | Freq: Every day | ORAL | Status: DC
  Administered 2022-07-04 – 2022-07-19 (×16): 40 mg via ORAL
  Filled 2022-07-04 (×17): qty 1

## 2022-07-04 MED ORDER — IOHEXOL 300 MG/ML  SOLN
100.0000 mL | Freq: Once | INTRAMUSCULAR | Status: AC | PRN
  Administered 2022-07-04: 100 mL via INTRAVENOUS

## 2022-07-04 MED ORDER — MELATONIN 3 MG PO TABS
3.0000 mg | ORAL_TABLET | Freq: Every evening | ORAL | Status: DC | PRN
  Administered 2022-07-07 – 2022-07-18 (×8): 3 mg via ORAL
  Filled 2022-07-04 (×8): qty 1

## 2022-07-04 MED ORDER — SODIUM CHLORIDE (PF) 0.9 % IJ SOLN
INTRAMUSCULAR | Status: AC
  Filled 2022-07-04: qty 50

## 2022-07-04 MED ORDER — SODIUM CHLORIDE 0.9 % IV SOLN
2.0000 g | Freq: Once | INTRAVENOUS | Status: AC
  Administered 2022-07-04: 2 g via INTRAVENOUS
  Filled 2022-07-04: qty 20

## 2022-07-04 MED ORDER — BISACODYL 10 MG RE SUPP: 10.0000 mg | Freq: Once | RECTAL | Status: DC

## 2022-07-04 MED ORDER — OXYCODONE-ACETAMINOPHEN 5-325 MG PO TABS
1.0000 | ORAL_TABLET | Freq: Four times a day (QID) | ORAL | Status: DC | PRN
  Administered 2022-07-04 – 2022-07-13 (×8): 1 via ORAL
  Filled 2022-07-04 (×8): qty 1

## 2022-07-04 MED ORDER — SODIUM CHLORIDE 0.9 % IV SOLN
500.0000 mg | Freq: Once | INTRAVENOUS | Status: AC
  Administered 2022-07-04: 500 mg via INTRAVENOUS
  Filled 2022-07-04: qty 5

## 2022-07-04 MED ORDER — SODIUM CHLORIDE 0.9 % IV SOLN: 1.0000 g | INTRAVENOUS | Status: DC

## 2022-07-04 MED ORDER — ALBUTEROL SULFATE (2.5 MG/3ML) 0.083% IN NEBU: 2.5000 mg | INHALATION_SOLUTION | RESPIRATORY_TRACT | Status: DC | PRN

## 2022-07-04 MED ORDER — SODIUM CHLORIDE 0.9 % IV BOLUS
1000.0000 mL | Freq: Once | INTRAVENOUS | Status: AC
  Administered 2022-07-04: 1000 mL via INTRAVENOUS

## 2022-07-04 MED ORDER — INSULIN ASPART 100 UNIT/ML IJ SOLN
0.0000 [IU] | Freq: Three times a day (TID) | INTRAMUSCULAR | Status: DC
  Administered 2022-07-04: 5 [IU] via SUBCUTANEOUS
  Administered 2022-07-04 (×2): 3 [IU] via SUBCUTANEOUS
  Administered 2022-07-05: 5 [IU] via SUBCUTANEOUS
  Administered 2022-07-05: 8 [IU] via SUBCUTANEOUS
  Filled 2022-07-04: qty 0.15

## 2022-07-04 MED ORDER — SODIUM CHLORIDE 1 G PO TABS
2.0000 g | ORAL_TABLET | Freq: Two times a day (BID) | ORAL | Status: DC
  Administered 2022-07-04 – 2022-07-19 (×28): 2 g via ORAL
  Filled 2022-07-04 (×28): qty 2

## 2022-07-04 MED ORDER — HYDROMORPHONE HCL 1 MG/ML IJ SOLN
0.5000 mg | INTRAMUSCULAR | Status: DC | PRN
  Administered 2022-07-04 – 2022-07-14 (×5): 0.5 mg via INTRAVENOUS
  Filled 2022-07-04: qty 1
  Filled 2022-07-04 (×4): qty 0.5

## 2022-07-04 MED ORDER — BENZONATATE 100 MG PO CAPS
200.0000 mg | ORAL_CAPSULE | Freq: Three times a day (TID) | ORAL | Status: DC | PRN
  Administered 2022-07-04: 200 mg via ORAL
  Filled 2022-07-04: qty 2

## 2022-07-04 MED ORDER — POLYETHYLENE GLYCOL 3350 17 G PO PACK
17.0000 g | PACK | Freq: Every day | ORAL | Status: DC
  Administered 2022-07-04 – 2022-07-19 (×10): 17 g via ORAL
  Filled 2022-07-04 (×14): qty 1

## 2022-07-04 MED ORDER — RIVAROXABAN 20 MG PO TABS
20.0000 mg | ORAL_TABLET | Freq: Every day | ORAL | Status: DC
  Administered 2022-07-04 – 2022-07-05 (×2): 20 mg via ORAL
  Filled 2022-07-04 (×2): qty 1

## 2022-07-04 MED ORDER — SORBITOL 70 % SOLN
400.0000 mL | TOPICAL_OIL | Freq: Once | ORAL | Status: AC
  Administered 2022-07-04: 400 mL via RECTAL
  Filled 2022-07-04: qty 120

## 2022-07-04 MED ORDER — SENNOSIDES-DOCUSATE SODIUM 8.6-50 MG PO TABS
1.0000 | ORAL_TABLET | Freq: Two times a day (BID) | ORAL | Status: DC
  Administered 2022-07-04 – 2022-07-19 (×22): 1 via ORAL
  Filled 2022-07-04 (×28): qty 1

## 2022-07-04 NOTE — ED Provider Notes (Signed)
Received patient in turnover from Dr. Alvino Chapel.  Please see their note for further details of Hx, PE.  Briefly patient is a 89 y.o. female with a Constipation .  Patient with decreased bowel movements for the past week and has had diarrhea over the past 24 to 48 hours.  Family is concerned that maybe she has ankle paresis came here for evaluation.  Patient found to have a significant sodium drop from just 5 days ago.  CT imaging with possible pneumonia.  She has been coughing off and on per the family.  Will discuss with medicine for admission.  CRITICAL CARE Performed by: Cecilio Asper   Total critical care time: 35 minutes  Critical care time was exclusive of separately billable procedures and treating other patients.  Critical care was necessary to treat or prevent imminent or life-threatening deterioration.  Critical care was time spent personally by me on the following activities: development of treatment plan with patient and/or surrogate as well as nursing, discussions with consultants, evaluation of patient's response to treatment, examination of patient, obtaining history from patient or surrogate, ordering and performing treatments and interventions, ordering and review of laboratory studies, ordering and review of radiographic studies, pulse oximetry and re-evaluation of patient's condition.    Deno Etienne, DO 07/04/22 [phone]

## 2022-07-04 NOTE — Progress Notes (Addendum)
Brief same day note:  Patient is a 89 year old female with history of chronic respiratory failure on 4 L of oxygen secondary to COPD, diabetes type 2, chronic diastolic CHF, hypertension, DVT on Xarelto who presented here from nursing facility after see complaint of right lower chest discomfort radiating to the right upper quadrant, nonproductive cough, subjective fever.  She was recently admitted here in February when she was managed for acute COPD exacerbation and was discharged on steroid taper. On condition she was mildly febrile, saturating fine on 4 L of oxygen.  Lab work showed sodium of 127, WBC count of 27.  COVID/RSV, influenza negative.  CT abdomen/pelvis showed no evidence of acute intra-abdominal or intrapelvic process but showed bilateral airspace opacities consistent with infiltrates/pneumonia.  Patient was admitted for the management of community-acquired pneumonia, started on antibiotics.  Patient seen and examined at bedside this morning in the emergency department.  She was complaining of severe back pain when I evaluated her.Abd was distended  Assessment and plan:  Sepsis secondary to community-acquired pneumonia: Presented with new onset cough, right lower chest discomfort.  CT abdomen/pelvis showed bibasilar infiltrates.  Also had fever, tachycardia, tachypnea presentation with leukocytosis.  Blood culture collected.  Started on ceftriaxone, azithromycin.  Leukocytosis: She was recently admitted here and was managed for COPD and was discharged on tapering steroids.  This is most likely from steroids.  Continue monitoring  Constipation/abdominal distention: As per the daughter, she does not have a bowel movement for 1 week.  She tried enema without improvement.  Her abdomen is distended.  CT abdomen suspected denies any obstruction but showed moderate constipation.  Will give her another dose of enema.  Continue aggressive bowel regimen with MiraLAX and Senokot  Hyponatremia:  Picture suggest SIADH.  Sodium low at 122.  BNP mild elevated.  Urine sodium more than 20. Continue fluid restriction.  Will start on salt tablets.  Patient was also taking hydrochlorothiazide at facility  Severe back pain: Complaining of severe back pain during my evaluation.  Continue pain medicine/Supportive care.  Physical therapy when appropriate.  If back pain persist, will order back imagings  Diabetes type 2: Most recent A1c of 10.3.  Continue current insulin regimen.  Monitor blood sugars  Chronic hypoxia failure/COPD: On 4 L of oxygen currently.  Currently not in exacerbation.  Continue bronchodilators  Chronic diastolic CHF: Last echo showed EF of 55 to 123456, grade 2 diastolic dysfunction.  Currently looks overall euvolemic.  Home hydrochlorothiazide on hold.  Hypertension: Currently above stable.  Takes amlodipine, Imdur,HCTZ at home,now on hold  History of DVT: Continue Xarelto  Severe hypomagnesemia: Currently supplemented, will be monitored  Anemia of chronic disease: Currently hemoglobin around baseline  Debility/deconditioning: Patient is currently lives with daughter . Unable to ambulate.  Uses wheelchair  I called and discussed my treatment plan with daughter Scottie on phone on 3/9

## 2022-07-04 NOTE — H&P (Signed)
History and Physical      [name] [id number] DOB: 28-Feb-1931 DOA: 07/16/2022  PCP: [name], DO  Patient coming from: home   I have personally briefly reviewed patient's old medical records in [place]  Chief Complaint: Right lower chest discomfort  HPI: [name] is a 89 y.o. female with medical history significant for chronic epoxy respiratory failure on 4 L nasal cannula at nighttime, type 2 diabetes mellitus, chronic diastolic heart failure, CVD, essential hypertension, DVT anticoagulated on Xarelto, anemia of chronic disease associated baseline hemoglobin 9-12, who is admitted to [facility] on 07/26/2022 with sepsis due to community-acquired pneumonia after presenting from home to [facility] ED complaining of right lower chest discomfort.   Patient reports 2 to 3 days of new right lower chest discomfort radiating into the right upper quadrant of the abdomen.  This been associate with new nonproductive cough as well as subjective fever, the absence of chills, voiding rigors, or generalized myalgias.  She notes that this right-sided lower chest discomfort is nonexertional, but does worsen with cough, will noting that it is nonpositional and not reproducible with direct palpation over the anterior chest wall.  Denies any overt associated shortness of breath, with apnea, or or worsening of peripheral edema.  Denies any substernal or left-sided chest discomfort.  She also denies any associated palpitations, diaphoresis, nausea, vomiting, dizziness, presyncope, or syncope.  No associated with any new calf tenderness or any new lower extremity erythema.  Of note, it appears that patient was hospitalized earlier in March 2024, with at least a component of this hospitalization notable for acute COPD exacerbation, upon which she was discharged following 12-day prednisone taper.  Additionally, in the setting of a history of DVT, she is currently anticoagulated on  Xarelto.  She confirms baseline supplemental oxygen requirements in the form of 4 L nasal cannula while asleep.    ED Course:  Vital signs in the ED were notable for the following: Temperature max 100.5; heart rate 90-98; stop blood pressures in the 130s over 40s; respiratory rate 16, oxygen saturation 95% on her baseline 4 L nasal cannula at night.  Labs were notable for the following:  CMP notable for the following: Sodium, corrected for concomitant hyperglycemia noted to be 127, which is relative to most recent prior serum sodium 137 on 06/29/2022, potassium 4.3, chloride 87, carbon 27, creatinine 1.07 compared to most recent prior serum creatinine data point of 1.02 on 06/29/2022, BUN to creatinine greater than 20, glucose 292, calcium just from out of anemia noted to be 9.0, eleven 2.9, elevated open 5, otherwise liver enzymes within results.  CBC notable for will with cell count 27,000 with 93% neutrophils relative demonstrates department cell count 72,000 on 06/29/2022, hemoglobin 11.6 associated normocytic/recurrent properties as well as nonelevated RDW, relative to receiving prior hemoglobin Digitaline of 11.1 on 06/29/2022.  Urinalysis ordered, others are currently pending.  Blood cultures x 2 were collected prior to initiation of IV antibiotics.  COVID, RSV, influenza PCR were all negative.  Imaging and additional notable ED work-up: CT abdomen/pelvis with contrast, per formal radiology read, demonstrated no evidence of acute intra-abdominal nor any evidence of acute intrapelvic process, including no evidence of acute gallbladder pathology, but did demonstrate evidence of bibasilar airspace opacities consistent with infiltrates, and suggestive of pneumonia, will demonstrated no evidence of edema, effusion, or pneumothorax.  While in the ED, the following were administered: Acetaminophen 1 g p.o. x 1, fentanyl 50 mcg IV x 1,  azithromycin, Rocephin, normal saline x 1 L bolus.  Subsequently, the  patient was admitted sepsis due to community-acquired pneumonia, with presentation also notable for acute hyponatremia.    Review of Systems: As per HPI otherwise 10 point review of systems negative.   Past Medical History:  Diagnosis Date   Cellulitis and abscess of left leg    CHF (congestive heart failure) (HCC)    COPD (chronic obstructive pulmonary disease) ([place])    Diabetes mellitus    Glaucoma    Hyperlipidemia    Hypertension     Past Surgical History:  Procedure Laterality Date   BACK SURGERY     CARDIAC CATHETERIZATION N/A 12/09/2015   Procedure: Right Heart Cath;  Surgeon: Adrian Prows, MD;  Location: [facility];  Service: Cardiovascular;  Laterality: N/A;   CHOLECYSTECTOMY  2000   HERNIA REPAIR  2007   rupture disk  1970"s   SHOULDER ARTHROSCOPY WITH ROTATOR CUFF REPAIR  1999   rt shoulder    Social History:  reports that she has never smoked. She has never used smokeless tobacco. She reports that she does not drink alcohol and does not use drugs.   Allergies  Allergen Reactions   Buspirone Shortness Of Breath and Other (See Comments)    "couldn't breathe"   Cephalexin Anaphylaxis, Swelling and Other (See Comments)   Micardis [Telmisartan] Other (See Comments)    Hyperkalemia during hospitalization with pulmonary edema   Atorvastatin Other (See Comments)    Myalgia    Canagliflozin Other (See Comments)    Pt does not recall reaction   Doxycycline Hyclate Nausea Only   Lipitor [Atorvastatin Calcium] Other (See Comments)    Myalgia    Tramadol Nausea Only    Family History  Problem Relation Age of Onset   Stroke Mother     Family history reviewed and not pertinent    Prior to Admission medications   Medication Sig Start Date End Date Taking? Authorizing Provider  acetaminophen (TYLENOL) 325 MG tablet Take 2 tablets (650 mg total) by mouth every 6 (six) hours as needed for mild pain (or Fever >/= 101). Patient not taking: Reported on  06/23/2022 05/07/22   Raiford Noble Latif, DO  albuterol (VENTOLIN HFA) 108 [phone] Base) MCG/ACT inhaler Inhale 2 puffs into the lungs every 6 (six) hours as needed for wheezing or shortness of breath. 06/25/22   Nita Sells, MD  amLODipine (NORVASC) 10 MG tablet TAKE 1 TABLET(10 MG) BY MOUTH DAILY Patient taking differently: Take 10 mg by mouth daily. 05/19/22   Adrian Prows, MD  azithromycin (ZITHROMAX) 250 MG tablet Finish all meds 06/26/22   Nita Sells, MD  Brinzolamide-Brimonidine [facility]) 1-0.2 % SUSP Place 1-2 drops into both eyes daily.    [provider]  hydrochlorothiazide (HYDRODIURIL) 12.5 MG tablet Take 1 tablet (12.5 mg total) by mouth daily. 06/26/22   Nita Sells, MD  insulin aspart protamine - aspart (NOVOLOG 70/30 MIX) (70-30) 100 UNIT/ML FlexPen Inject 10 Units into the skin 2 (two) times daily with a meal. 06/25/22   Nita Sells, MD  isosorbide mononitrate (IMDUR) 60 MG 24 hr tablet TAKE 1 TABLET DAILY Patient taking differently: Take 60 mg by mouth daily. 09/15/21   Adrian Prows, MD  levalbuterol Penne Lash) 0.63 MG/3ML nebulizer solution Take 3 mLs (0.63 mg total) by nebulization every 6 (six) hours. 06/25/22   Nita Sells, MD  lidocaine (LIDODERM) 5 % Place 1 patch onto the skin daily. Remove & Discard patch within 12 hours  or as directed by MD 06/30/22   Godfrey Pick, MD  metFORMIN (GLUCOPHAGE) 1000 MG tablet Take 0.5 tablets (500 mg total) by mouth daily. 10/27/16   [name], MD  pantoprazole (PROTONIX) 40 MG tablet Take 1 tablet (40 mg total) by mouth daily. 05/08/22   Raiford Noble Latif, DO  predniSONE (DELTASONE) 20 MG tablet Take 3 tablets (60 mg total) by mouth daily before breakfast for 4 days, THEN 2 tablets (40 mg total) daily before breakfast for 4 days, THEN 1 tablet (20 mg total) daily before breakfast for 4 days. 06/26/22 07/08/22  Nita Sells, MD  rivaroxaban (XARELTO) 20 MG TABS tablet Take 1 tablet (20 mg total) by  mouth daily with supper. Patient taking differently: Take 20 mg by mouth daily with lunch. 06/03/22   Charlynne Cousins, MD  ROCKLATAN 0.02-0.005 % SOLN Place 1 drop into both eyes at bedtime. 11/24/19   [provider]  TYLENOL 8 HOUR ARTHRITIS PAIN 650 MG CR tablet Take 650-1,300 mg by mouth every 8 (eight) hours as needed for pain.    [provider]  umeclidinium-vilanterol (ANORO ELLIPTA) 62.5-25 MCG/ACT AEPB Inhale 1 puff into the lungs daily. 06/26/22   Nita Sells, MD  zinc sulfate 220 (50 Zn) MG capsule Take 1 capsule (220 mg total) by mouth daily. Patient not taking: Reported on 06/23/2022 05/08/22   Kerney Elbe, DO     Objective    Physical Exam: Vitals:   07/15/2022 2136 07/17/2022 2334  BP: (!) 140/68   Pulse: 98   Resp: 16   Temp: 99.1 F (37.3 C) (!) 100.5 F (38.1 C)  TempSrc: Oral Rectal  SpO2: 95%     General: appears to be stated age; alert, oriented Skin: warm, dry, no rash Head:  AT/[place] Mouth:  Oral mucosa membranes appear dry, normal dentition Neck: supple; trachea midline Heart:  RRR; did not appreciate any M/R/G Lungs: CTAB, did not appreciate any wheezes, rales, or rhonchi Abdomen: + BS; soft, ND, NT Vascular: 2+ pedal pulses b/l; 2+ radial pulses b/l Extremities: no peripheral edema, no muscle wasting Neuro: strength and sensation intact in upper and lower extremities b/l    Labs on Admission: I have personally reviewed following labs and imaging studies  CBC: Recent Labs  Lab 06/29/22 2342 07/02/2022 2228  WBC 16.9* 27.1*  NEUTROABS 14.4* 25.3*  HGB 11.1* 11.6*  HCT 35.8* 35.5*  MCV 91.6 87.4  PLT PLATELET CLUMPS NOTED ON SMEAR, UNABLE TO ESTIMATE PLATELET CLUMPS NOTED ON SMEAR, UNABLE TO ESTIMATE   Basic Metabolic Panel: Recent Labs  Lab 06/29/22 2342 07/23/2022 2228  NA 133* 124*  K 4.6 4.3  CL 96* 87*  CO2 28 27  GLUCOSE 376* 292*  BUN 39* 28*  CREATININE 1.02* 1.07*  CALCIUM 8.7* 8.2*  MG 1.5*  --     GFR: Estimated Creatinine Clearance: 37.1 mL/min (A) (by C-G formula based on SCr of 1.07 mg/dL (H)). Liver Function Tests: Recent Labs  Lab 06/29/22 2342 07/10/2022 2228  AST 18 18  ALT 12 23  ALKPHOS 72 86  BILITOT 0.5 1.5*  PROT 6.5 6.7  ALBUMIN 3.1* 2.9*   No results for input(s): "LIPASE", "AMYLASE" in the last 168 hours. No results for input(s): "AMMONIA" in the last 168 hours. Coagulation Profile: No results for input(s): "INR", "PROTIME" in the last 168 hours. Cardiac Enzymes: No results for input(s): "CKTOTAL", "CKMB", "CKMBINDEX", "TROPONINI" in the last 168 hours. BNP (last 3 results) No results for input(s): "PROBNP"  in the last 8760 hours. HbA1C: No results for input(s): "HGBA1C" in the last 72 hours. CBG: Recent Labs  Lab 06/29/22 2348 06/30/22 0509  GLUCAP 403* 153*   Lipid Profile: No results for input(s): "CHOL", "HDL", "LDLCALC", "TRIG", "CHOLHDL", "LDLDIRECT" in the last 72 hours. Thyroid Function Tests: No results for input(s): "TSH", "T4TOTAL", "FREET4", "T3FREE", "THYROIDAB" in the last 72 hours. Anemia Panel: No results for input(s): "VITAMINB12", "FOLATE", "FERRITIN", "TIBC", "IRON", "RETICCTPCT" in the last 72 hours. Urine analysis:    Component Value Date/Time   COLORURINE YELLOW 05/13/2022 0033   APPEARANCEUR CLEAR 05/13/2022 0033   LABSPEC 1.022 05/13/2022 0033   PHURINE 6.0 05/13/2022 0033   GLUCOSEU >=500 (A) 05/13/2022 0033   HGBUR SMALL (A) 05/13/2022 0033   BILIRUBINUR NEGATIVE 05/13/2022 0033   KETONESUR NEGATIVE 05/13/2022 0033   PROTEINUR NEGATIVE 05/13/2022 0033   UROBILINOGEN 0.2 12/05/2010 1740   NITRITE NEGATIVE 05/13/2022 0033   LEUKOCYTESUR SMALL (A) 05/13/2022 0033    Radiological Exams on Admission: CT ABDOMEN PELVIS W CONTRAST  Result Date: 07/04/2022 CLINICAL DATA:  Abdominal pain. EXAM: CT ABDOMEN AND PELVIS WITH CONTRAST TECHNIQUE: Multidetector CT imaging of the abdomen and pelvis was performed using the  standard protocol following bolus administration of intravenous contrast. RADIATION DOSE REDUCTION: This exam was performed according to the departmental dose-optimization program which includes automated exposure control, adjustment of the mA and/or kV according to patient size and/or use of iterative reconstruction technique. CONTRAST:  134m OMNIPAQUE IOHEXOL 300 MG/ML  SOLN COMPARISON:  CT abdomen pelvis dated 11/14/2010. FINDINGS: Lower chest: Bibasilar subpleural streaky and reticular atelectasis or infiltrate. Aspiration is not excluded clinical correlation is recommended. There is coronary vascular calcification. No intra-abdominal free air or free fluid. Hepatobiliary: The liver is unremarkable. There is mild biliary dilatation, post cholecystectomy. No retained calcified stone noted in the central CBD. Pancreas: Unremarkable. No pancreatic ductal dilatation or surrounding inflammatory changes. Spleen: There is a 3.5 cm cyst in the spleen. The spleen is otherwise unremarkable. Adrenals/Urinary Tract: There adrenal glands are unremarkable. There is a 3 cm left renal inferior pole cyst. There is no hydronephrosis on either side. There is symmetric enhancement and excretion of contrast by both kidneys. The visualized ureters and urinary bladder appear unremarkable. Stomach/Bowel: There is sigmoid diverticulosis without active inflammatory changes. There is moderate stool throughout the colon. There is no bowel obstruction or active inflammation. The appendix is normal. Vascular/Lymphatic: Mild aortoiliac atherosclerotic disease. The IVC is unremarkable. No portal venous gas. No adenopathy. Reproductive: The uterus is grossly unremarkable. No adnexal masses. Other: Ventral hernia repair mesh. Musculoskeletal: Osteopenia with degenerative changes of the spine. No acute osseous pathology. IMPRESSION: 1. No acute intra-abdominal or pelvic pathology. 2. Sigmoid diverticulosis. No bowel obstruction. Normal appendix.  3. Bibasilar subpleural streaky and reticular atelectasis or infiltrate. Aspiration is not excluded clinical correlation is recommended. 4.  Aortic Atherosclerosis (ICD10-I70.0). Electronically Signed   By: AAnner CreteM.D.   On: 07/04/2022 01:17   DG Abdomen 1 View  Result Date: 07/09/2022 CLINICAL DATA:  constipation EXAM: ABDOMEN - 1 VIEW COMPARISON:  CT angio chest 06/30/2022, CT abdomen pelvis 11/14/2010 FINDINGS: Limited evaluation due to overlapping osseous structures and overlying soft tissues. Stool throughout the ascending colon. The bowel gas pattern is normal. Calcified splenic artery within left upper quadrant again noted. No radio-opaque calculi or other significant radiographic abnormality are seen. Surgical tacks related to a ventral wall hernia repair noted overlying the abdomen and pelvis. IMPRESSION: Nonobstructive bowel gas pattern. Electronically Signed  By: Iven Finn M.D.   On: 07/22/2022 23:09      Assessment/Plan    Principal Problem:   CAP (community acquired pneumonia) Active Problems:   Chronic diastolic CHF (congestive heart failure) (HCC)   Essential hypertension   DM2 (diabetes mellitus, type 2) (HCC)   COPD (chronic obstructive pulmonary disease) (HCC)   Acute hyponatremia   History of DVT (deep vein thrombosis)   Anemia of chronic disease     #) Sepsis due to community-acquired pneumonia: Diagnosis on the basis of 2 3 days of new onset cough associated with right lower chest discomfort and distribution similar to the bibasilar infiltrates identified on today's CT abdomen/pelvis, with presentation also notable for objective fever.  SIRS criteria met via fever, tachycardia, tachypnea, interval worsening of patient's leukocytosis, in spite of de-escalation of outpatient prednisone taper. Of note, in the absence of any evidence of end organ damage, although lactic acid level currently pending, pt's sepsis does not meet criteria to be considered severe  in nature. Also, in the absence of LA level greater than or equal to 4.0, and in the absence of any associated hypotension refractory to IVF's, there are no indications for administration of a 30 mL/kg IVF bolus at this time.   Additional ED work-up/management notable for: Blood cultures x 2 collected prior to initiation of azithromycin and Rocephin, which will be continued for now for community-acquired pneumonia coverage.  No e/o additional infectious process at this time, including negative COVID, influenza, RSV PCR.  Also check urinalysis to further assess for any additional sources of underlying infection.   Plan: CBC w/ diff and CMP in AM. Follow for results of blood cx's x 2. Abx: Continue azithromycin and Rocephin, as above.  Check lactic acid level.  Add on procalcitonin level.  Check strep urine antigen.  Check dedicated chest x-ray.  Incentive [place], flutter valve.  Check urinalysis.  Prn Tessalon Perles.           #) Acute hypo-osmolar corrected presenting serum sodium 127 and her limited to corrected most recent serum sodium level of 137 on 06/29/2022 hyponatremia:  Suspect an element of hypovolumeia, with suspected contribution from dehydration in the setting of clinical evidence of such.  However, differential also includes the possibility of a contribution from SIADH, particularly given presenting community-acquired pneumonia.  As the patient is already received a 1 L NS bolus,  will refrain from provision of additional IVF's pending the result of random urine sodium and urine osmolality studies to provide additional insight into potential contributions from dehydration vs SIADH, as subsequent management would differ depending upon these results.   Differential also includes various pharmacologic possibilities, including HCTZ, particularly notable in the setting of concomitant hypochloremia, as well as potential contribution from recent prednisone taper, as above. Of note, no  evidence of associated acute focal neurologic deficits and no report of recent trauma.     Plan: monitor strict I's and O's and daily weights.  check UA, random urine sodium, urine osmolality.  Check serum osmolality to confirm suspected hypoosmolar etiology.  Repeat Pete in the morning. Check TSH. Check serum uric acid level, as SIADH can be associated with hypouricemia due to hyperuricuria.  Hold home HCTZ, prn Percocet.  Add on BNP.  Follow-up result of dedicated chest x-ray.  Further evaluation management of presenting suspected [facility] pneumonia, as above.  Refraining from additional IV fluids for now, pending the aforementioned additional workup for acute imminent uremia, as further detailed above.             #)  Type 2 Diabetes Mellitus: documented history of such. Home insulin regimen: NovoLog 70/30, 10 units SQ twice daily. Home oral hypoglycemic agents: Metformin. presenting blood sugar: 292, with potential hyper glycemia contribution from ongoing prednisone taper. Most recent A1c noted to be 10.3% when checked on 05/03/2022.   Plan: accuchecks QAC and HS with low dose SSI.  Lantus 8 units subcu twice daily. hold home oral hypoglycemic agents during this hospitalization.             #) Chronic diastolic heart failure: documented history of such, with most recent echocardiogram performed in June 2021, which was notable for LVEF 55 to 60% as well as grade 2 diastolic dysfunction. No clinical or radiographic evidence to suggest acutely decompensated heart failure at this time, although result dedicated chest x-ray is currently pending. home diuretic regimen reportedly consists of the following: HCTZ, which will be held in the setting of presenting acute hyponatremia, as further detailed above..    Plan: monitor strict I's & O's and daily weights. Repeat CMP in AM. Check serum mag level.  Hold home HCTZ for now, as further detailed above.  Follow-up result indicated chest  x-ray.  Add on BNP.             #) History of COPD: Documented history thereof, without clinical evidence of acute exacerbation at this time, while noting that it appears the patient was recently hospitalized, during which hospital course included acute COPD exacerbation, for which she was discharged on 12-day prednisone taper, with approximately 4 days remaining in this taper at the time of this evening's presentation.  This evening, clinically, does not appear that she is experiencing additional acute COPD exacerbation at the present time.  However, she is certainly at risk for an additional acute COPD exacerbation given physiologic stress posed by her presenting community-acquired pneumonia.  Will closely monitor ensuing clinical trend from a respiratory standpoint, as further outlined below.  outpatient respiratory regimen includes the following: Anoro Ellipta.   Plan: cont outpatient Anora Ellipta. Prn albuterol nebulizer. Check CMP and serum magnesium level in the AM.  Prednisone 20 mg p.o. daily x 4 additional days per discharge prednisone taper, as above.             #) Essential Hypertension: documented h/o such, with outpatient antihypertensive regimen including amlodipine, HCTZ, Imdur.  SBP's in the ED today: 130s to 140s mmHg. in the setting of presenting acute hyponatremia, will hold home HCTZ for now.  Additionally, in the setting of sepsis due to suspected community-acquired pneumonia, will hold additional antihypertensive medications for now.  Plan: Close monitoring of subsequent BP via routine VS. hold home HCTZ, Lodine, and Imdur for now as further detailed above.            #) History of DVT: Documented history of such, currently anticoagulated on Xarelto.  Plan: Continue outpatient Xarelto.  Repeat CBC in the morning.            #) Anemia of chronic disease: Documented history of such, a/w with baseline hgb range 9-12, with presenting hgb  consistent with this range, in the absence of any overt evidence of active bleed.  It is noted that she is currently anticoagulated on Xarelto in the setting of recent history of DVT, as further detailed above.   Plan: Repeat CBC in the morning.        DVT prophylaxis: SCD's plus continuation of outpatient Xarelto Code Status: Full code Family Communication: none Disposition Plan: Per Rounding Team  Consults called: none;  Admission status: Inpatient    I SPENT GREATER THAN 75  MINUTES IN CLINICAL CARE TIME/MEDICAL DECISION-MAKING IN COMPLETING THIS ADMISSION.     Imbery DO [facility] [place]   07/04/2022, 2:17 AM

## 2022-07-04 NOTE — ED Notes (Signed)
ED TO INPATIENT HANDOFF REPORT  ED Nurse Name and Phone #: Anderson Malta [phone]  S Name/Age/Gender [name] 89 y.o. female Room/Bed: WA11/WA11  Code Status   Code Status: Full Code  Home/SNF/Other Home Patient oriented to: self, place, time, and situation Is this baseline? Yes   Triage Complete: Triage complete  Chief Complaint CAP (community acquired pneumonia) [J18.9]  Triage Note Pt arrived via EMS from home for abdominal pain, no bowel movement for 6 days. 10/10 pain.    Allergies Allergies  Allergen Reactions   Buspirone Shortness Of Breath and Other (See Comments)    "couldn't breathe"   Cephalexin Anaphylaxis, Swelling and Other (See Comments)   Micardis [Telmisartan] Other (See Comments)    Hyperkalemia during hospitalization with pulmonary edema   Atorvastatin Other (See Comments)    Myalgia    Canagliflozin Other (See Comments)    Pt does not recall reaction   Doxycycline Hyclate Nausea Only   Lipitor [Atorvastatin Calcium] Other (See Comments)    Myalgia    Tramadol Nausea Only    Level of Care/Admitting Diagnosis ED Disposition     ED Disposition  Admit   Condition  --   Comment  Hospital Area: [facility] [[id number]]  Level of Care: Telemetry [5]  Admit to tele based on following criteria: Monitor for Ischemic changes  May admit patient to [facility] if equivalent level of care is available:: No  Covid Evaluation: Asymptomatic - no recent exposure (last 10 days) testing not required  Diagnosis: CAP (community acquired pneumonia) [id number]  Admitting Physician: Rhetta Mura [id number]  Attending Physician: Rhetta Mura [id number]  Certification:: I certify this patient will need inpatient services for at least 2 midnights  Estimated Length of Stay: 2          B Medical/Surgery History Past Medical History:  Diagnosis Date   Cellulitis and abscess of left leg    CHF (congestive heart  failure) ([facility])    COPD (chronic obstructive pulmonary disease) ([facility])    Diabetes mellitus    Glaucoma    Hyperlipidemia    Hypertension    Past Surgical History:  Procedure Laterality Date   BACK SURGERY     CARDIAC CATHETERIZATION N/A 12/09/2015   Procedure: Right Heart Cath;  Surgeon: Adrian Prows, MD;  Location: [facility];  Service: Cardiovascular;  Laterality: N/A;   CHOLECYSTECTOMY  2000   HERNIA REPAIR  2007   rupture disk  1970"s   SHOULDER ARTHROSCOPY WITH ROTATOR CUFF REPAIR  1999   rt shoulder     A IV Location/Drains/Wounds Patient Lines/Drains/Airways Status     Active Line/Drains/Airways     Name Placement date Placement time Site Days   Peripheral IV 06/30/22 20 G 1" Right Antecubital 06/30/22  0142  Antecubital  4   Peripheral IV 07/04/22 20 G 1" Anterior;Proximal;Right Forearm 07/04/22  0031  Forearm  less than 1   External Urinary Catheter 07/04/22  0000  --  less than 1   Pressure Injury 05/14/22 Coccyx Medial Stage 2 -  Partial thickness loss of dermis presenting as a shallow open injury with a red, pink wound bed without slough. 05/14/22  2000  -- 51            Intake/Output Last 24 hours  Intake/Output Summary (Last 24 hours) at 07/04/2022 1104 Last data filed at 07/04/2022 0544 Gross per 24 hour  Intake 1251.28 ml  Output --  Net 1251.28 ml  Labs/Imaging Results for orders placed or performed during the hospital encounter of 07/16/2022 (from the past 48 hour(s))  Comprehensive metabolic panel     Status: Abnormal   Collection Time: 07/10/2022 10:28 PM  Result Value Ref Range   Sodium 124 (L) 135 - 145 mmol/L   Potassium 4.3 3.5 - 5.1 mmol/L   Chloride 87 (L) 98 - 111 mmol/L   CO2 27 22 - 32 mmol/L   Glucose, Bld 292 (H) 70 - 99 mg/dL    Comment: Glucose reference range applies only to samples taken after fasting for at least 8 hours.   BUN 28 (H) 8 - 23 mg/dL   Creatinine, Ser 1.07 (H) 0.44 - 1.00 mg/dL   Calcium 8.2 (L) 8.9 - 10.3  mg/dL   Total Protein 6.7 6.5 - 8.1 g/dL   Albumin 2.9 (L) 3.5 - 5.0 g/dL   AST 18 15 - 41 U/L   ALT 23 0 - 44 U/L   Alkaline Phosphatase 86 38 - 126 U/L   Total Bilirubin 1.5 (H) 0.3 - 1.2 mg/dL   GFR, Estimated 50 (L) >60 mL/min    Comment: (NOTE) Calculated using the CKD-EPI Creatinine Equation (2021)    Anion gap 10 5 - 15    Comment: Performed at [facility], [place] [address] [place], [place] [address]  CBC with Differential     Status: Abnormal   Collection Time: 07/26/2022 10:28 PM  Result Value Ref Range   WBC 27.1 (H) 4.0 - 10.5 K/uL   RBC 4.06 3.87 - 5.11 MIL/uL   Hemoglobin 11.6 (L) 12.0 - 15.0 g/dL   HCT 35.5 (L) 36.0 - 46.0 %   MCV 87.4 80.0 - 100.0 fL   MCH 28.6 26.0 - 34.0 pg   MCHC 32.7 30.0 - 36.0 g/dL   RDW 15.0 11.5 - 15.5 %   Platelets PLATELET CLUMPS NOTED ON SMEAR, UNABLE TO ESTIMATE 150 - 400 K/uL    Comment: Immature Platelet Fraction may be clinically indicated, consider ordering this additional test [id number]    nRBC 0.0 0.0 - 0.2 %   Neutrophils Relative % 93 %   Neutro Abs 25.3 (H) 1.7 - 7.7 K/uL   Lymphocytes Relative 2 %   Lymphs Abs 0.6 (L) 0.7 - 4.0 K/uL   Monocytes Relative 4 %   Monocytes Absolute 0.9 0.1 - 1.0 K/uL   Eosinophils Relative 0 %   Eosinophils Absolute 0.1 0.0 - 0.5 K/uL   Basophils Relative 0 %   Basophils Absolute 0.1 0.0 - 0.1 K/uL   Immature Granulocytes 1 %   Abs Immature Granulocytes 0.15 (H) 0.00 - 0.07 K/uL    Comment: Performed at [facility], [place] [address] [place], [place] [address]  Magnesium     Status: Abnormal   Collection Time: 07/07/2022 10:28 PM  Result Value Ref Range   Magnesium 1.3 (L) 1.7 - 2.4 mg/dL    Comment: Performed at [facility], [place] [address], [place], [place] [address]  Procalcitonin     Status: None   Collection Time: 07/01/2022 10:28 PM  Result Value Ref Range   Procalcitonin 0.74 ng/mL    Comment:        Interpretation: PCT >  0.5 ng/mL and <= 2 ng/mL: Systemic infection (sepsis) is possible, but other conditions are known to elevate PCT as well. (NOTE)       Sepsis PCT Algorithm  Lower Respiratory Tract                                      Infection PCT Algorithm    ----------------------------     ----------------------------         PCT < 0.25 ng/mL                PCT < 0.10 ng/mL          Strongly encourage             Strongly discourage   discontinuation of antibiotics    initiation of antibiotics    ----------------------------     -----------------------------       PCT 0.25 - 0.50 ng/mL            PCT 0.10 - 0.25 ng/mL               OR       >80% decrease in PCT            Discourage initiation of                                            antibiotics      Encourage discontinuation           of antibiotics    ----------------------------     -----------------------------         PCT >= 0.50 ng/mL              PCT 0.26 - 0.50 ng/mL                AND       <80% decrease in PCT             Encourage initiation of                                             antibiotics       Encourage continuation           of antibiotics    ----------------------------     -----------------------------        PCT >= 0.50 ng/mL                  PCT > 0.50 ng/mL               AND         increase in PCT                  Strongly encourage                                      initiation of antibiotics    Strongly encourage escalation           of antibiotics                                     -----------------------------  PCT <= 0.25 ng/mL                                                 OR                                        > 80% decrease in PCT                                      Discontinue / Do not initiate                                             antibiotics  Performed at [place] [address], [place], [place]  [address]   Brain natriuretic peptide     Status: Abnormal   Collection Time: 07/11/2022 10:28 PM  Result Value Ref Range   B Natriuretic Peptide 197.2 (H) 0.0 - 100.0 pg/mL    Comment: Performed at [facility], [place] [address] [place], [place] [address]  Resp panel by RT-PCR (RSV, Flu A&B, Covid)     Status: None   Collection Time: 07/04/22  1:23 AM  Result Value Ref Range   SARS Coronavirus 2 by RT PCR NEGATIVE NEGATIVE    Comment: (NOTE) SARS-CoV-2 target nucleic acids are NOT DETECTED.  The SARS-CoV-2 RNA is generally detectable in upper respiratory specimens during the acute phase of infection. The lowest concentration of SARS-CoV-2 viral copies this assay can detect is 138 copies/mL. A negative result does not preclude SARS-Cov-2 infection and should not be used as the sole basis for treatment or other patient management decisions. A negative result may occur with  improper specimen collection/handling, submission of specimen other than nasopharyngeal swab, presence of viral mutation(s) within the areas targeted by this assay, and inadequate number of viral copies(<138 copies/mL). A negative result must be combined with clinical observations, patient history, and epidemiological information. The expected result is Negative.  Fact Sheet for Patients:  EntrepreneurPulse.com.au  Fact Sheet for Healthcare Providers:  IncredibleEmployment.be  This test is no t yet approved or cleared by the [place] FDA and  has been authorized for detection and/or diagnosis of SARS-CoV-2 by FDA under an Emergency Use Authorization (EUA). This EUA will remain  in effect (meaning this test can be used) for the duration of the COVID-19 declaration under Section 564(b)(1) of the Act, 21 U.S.C.section 360bbb-3(b)(1), unless the authorization is terminated  or revoked sooner.       Influenza A by PCR NEGATIVE NEGATIVE   Influenza B by PCR  NEGATIVE NEGATIVE    Comment: (NOTE) The Xpert Xpress SARS-CoV-2/FLU/RSV plus assay is intended as an aid in the diagnosis of influenza from Nasopharyngeal swab specimens and should not be used as a sole basis for treatment. Nasal washings and aspirates are unacceptable for Xpert Xpress SARS-CoV-2/FLU/RSV testing.  Fact Sheet for Patients: EntrepreneurPulse.com.au  Fact Sheet for Healthcare Providers: IncredibleEmployment.be  This test is not yet approved or cleared by the [place] FDA and has been authorized for detection and/or diagnosis of  SARS-CoV-2 by FDA under an Emergency Use Authorization (EUA). This EUA will remain in effect (meaning this test can be used) for the duration of the COVID-19 declaration under Section 564(b)(1) of the Act, 21 U.S.C. section 360bbb-3(b)(1), unless the authorization is terminated or revoked.     Resp Syncytial Virus by PCR NEGATIVE NEGATIVE    Comment: (NOTE) Fact Sheet for Patients: EntrepreneurPulse.com.au  Fact Sheet for Healthcare Providers: IncredibleEmployment.be  This test is not yet approved or cleared by the [place] FDA and has been authorized for detection and/or diagnosis of SARS-CoV-2 by FDA under an Emergency Use Authorization (EUA). This EUA will remain in effect (meaning this test can be used) for the duration of the COVID-19 declaration under Section 564(b)(1) of the Act, 21 U.S.C. section 360bbb-3(b)(1), unless the authorization is terminated or revoked.  Performed at [facility], [place] [address], [place], [place] [address]   Sodium, urine, random     Status: None   Collection Time: 07/04/22  2:48 AM  Result Value Ref Range   Sodium, Ur 75 mmol/L    Comment: Performed at [facility], [place] [address], [place], [place] [address]  Creatinine, urine, random     Status: None   Collection Time: 07/04/22   2:48 AM  Result Value Ref Range   Creatinine, Urine 43 mg/dL    Comment: Performed at [facility], [place] [address], [place], [place] [address]  CBC with Differential/Platelet     Status: Abnormal   Collection Time: 07/04/22  5:00 AM  Result Value Ref Range   WBC 25.5 (H) 4.0 - 10.5 K/uL   RBC 3.91 3.87 - 5.11 MIL/uL   Hemoglobin 11.0 (L) 12.0 - 15.0 g/dL   HCT 34.7 (L) 36.0 - 46.0 %   MCV 88.7 80.0 - 100.0 fL   MCH 28.1 26.0 - 34.0 pg   MCHC 31.7 30.0 - 36.0 g/dL   RDW 15.1 11.5 - 15.5 %   Platelets PLATELET CLUMPS NOTED ON SMEAR, UNABLE TO ESTIMATE 150 - 400 K/uL    Comment: SPECIMEN CHECKED FOR CLOTS Immature Platelet Fraction may be clinically indicated, consider ordering this additional test [id number] REPEATED TO VERIFY    nRBC 0.0 0.0 - 0.2 %   Neutrophils Relative % 91 %   Neutro Abs 23.2 (H) 1.7 - 7.7 K/uL   Lymphocytes Relative 4 %   Lymphs Abs 1.0 0.7 - 4.0 K/uL   Monocytes Relative 4 %   Monocytes Absolute 1.1 (H) 0.1 - 1.0 K/uL   Eosinophils Relative 0 %   Eosinophils Absolute 0.0 0.0 - 0.5 K/uL   Basophils Relative 0 %   Basophils Absolute 0.1 0.0 - 0.1 K/uL   Immature Granulocytes 1 %   Abs Immature Granulocytes 0.20 (H) 0.00 - 0.07 K/uL    Comment: Performed at [facility], [place] [address] [place], [place] [address]  Comprehensive metabolic panel     Status: Abnormal   Collection Time: 07/04/22  5:00 AM  Result Value Ref Range   Sodium 122 (L) 135 - 145 mmol/L   Potassium 3.8 3.5 - 5.1 mmol/L   Chloride 85 (L) 98 - 111 mmol/L   CO2 25 22 - 32 mmol/L   Glucose, Bld 239 (H) 70 - 99 mg/dL    Comment: Glucose reference range applies only to samples taken after fasting for at least 8 hours.   BUN 25 (H) 8 - 23 mg/dL   Creatinine, Ser 1.00 0.44 -  1.00 mg/dL   Calcium 8.1 (L) 8.9 - 10.3 mg/dL   Total Protein 6.3 (L) 6.5 - 8.1 g/dL   Albumin 2.9 (L) 3.5 - 5.0 g/dL   AST 14 (L) 15 - 41 U/L   ALT 20 0 - 44 U/L   Alkaline  Phosphatase 87 38 - 126 U/L   Total Bilirubin 1.1 0.3 - 1.2 mg/dL   GFR, Estimated 54 (L) >60 mL/min    Comment: (NOTE) Calculated using the CKD-EPI Creatinine Equation (2021)    Anion gap 12 5 - 15    Comment: Performed at [facility], [place] [address] [place], [place] [address]  Magnesium     Status: Abnormal   Collection Time: 07/04/22  5:00 AM  Result Value Ref Range   Magnesium 1.3 (L) 1.7 - 2.4 mg/dL    Comment: Performed at [facility], [place] [address], [place], [place] [address]  Phosphorus     Status: None   Collection Time: 07/04/22  5:00 AM  Result Value Ref Range   Phosphorus 3.5 2.5 - 4.6 mg/dL    Comment: Performed at [facility], [place] [address], [place], [place] [address]  Lactic acid, plasma     Status: None   Collection Time: 07/04/22  5:00 AM  Result Value Ref Range   Lactic Acid, Venous 1.2 0.5 - 1.9 mmol/L    Comment: Performed at [facility], [place] [address], [place], [place] [address]  TSH     Status: None   Collection Time: 07/04/22  5:00 AM  Result Value Ref Range   TSH 0.879 0.350 - 4.500 uIU/mL    Comment: Performed by a 3rd Generation assay with a functional sensitivity of <=0.01 uIU/mL. Performed at [facility], [place] [address] [place], [place] [address]   Uric acid     Status: None   Collection Time: 07/04/22  5:00 AM  Result Value Ref Range   Uric Acid, Serum 6.4 2.5 - 7.1 mg/dL    Comment: Performed at [facility], [place] [address], [place], [place] [address]  Osmolality     Status: None   Collection Time: 07/04/22  5:00 AM  Result Value Ref Range   Osmolality 282 275 - 295 mOsm/kg    Comment: Performed at [place] [address] [place], [place] [address]  Urinalysis, Routine w reflex microscopic -Urine, Clean Catch     Status: Abnormal   Collection Time: 07/04/22  5:41 AM  Result Value Ref Range   Color, Urine YELLOW  YELLOW   APPearance CLEAR CLEAR   Specific Gravity, Urine >1.046 (H) 1.005 - 1.030   pH 6.0 5.0 - 8.0   Glucose, UA NEGATIVE NEGATIVE mg/dL   Hgb urine dipstick SMALL (A) NEGATIVE   Bilirubin Urine NEGATIVE NEGATIVE   Ketones, ur 5 (A) NEGATIVE mg/dL   Protein, ur 30 (A) NEGATIVE mg/dL   Nitrite NEGATIVE NEGATIVE   Leukocytes,Ua TRACE (A) NEGATIVE   RBC / HPF 6-10 0 - 5 RBC/hpf   WBC, UA 6-10 0 - 5 WBC/hpf   Bacteria, UA NONE SEEN NONE SEEN   Squamous Epithelial / HPF 0-5 0 - 5 /HPF    Comment: Performed at [facility], [place] [address], [place], [place] [address]  Blood culture (routine x 2)     Status: None (Preliminary result)   Collection Time: 07/04/22  6:56 AM   Specimen: BLOOD RIGHT HAND  Result  Value Ref Range   Specimen Description      BLOOD RIGHT HAND Performed at [facility], [place] [address], [place], [place] [address]    Special Requests      BOTTLES DRAWN AEROBIC AND ANAEROBIC Blood Culture results may not be optimal due to an excessive volume of blood received in culture bottles Performed at [place] [address] [place], [place] [address]    Culture PENDING    Report Status PENDING   CBG monitoring, ED     Status: Abnormal   Collection Time: 07/04/22  8:41 AM  Result Value Ref Range   Glucose-Capillary 246 (H) 70 - 99 mg/dL    Comment: Glucose reference range applies only to samples taken after fasting for at least 8 hours.   DG Chest Port 1 View  Result Date: 07/04/2022 CLINICAL DATA:  Constipation and cough EXAM: PORTABLE CHEST 1 VIEW COMPARISON:  CTA chest 06/30/2022 FINDINGS: Stable cardiomegaly. Aortic atherosclerotic calcification. Pulmonary vascular congestion. Unchanged mild left basilar airspace opacities. No definite pleural effusion. No pneumothorax. No acute osseous abnormality. Postoperative changes right humeral head. IMPRESSION: 1. Cardiomegaly with pulmonary vascular congestion. 2. Unchanged mild  left basilar atelectasis/scarring. Electronically Signed   By: Placido Sou M.D.   On: 07/04/2022 03:28   CT ABDOMEN PELVIS W CONTRAST  Result Date: 07/04/2022 CLINICAL DATA:  Abdominal pain. EXAM: CT ABDOMEN AND PELVIS WITH CONTRAST TECHNIQUE: Multidetector CT imaging of the abdomen and pelvis was performed using the standard protocol following bolus administration of intravenous contrast. RADIATION DOSE REDUCTION: This exam was performed according to the departmental dose-optimization program which includes automated exposure control, adjustment of the mA and/or kV according to patient size and/or use of iterative reconstruction technique. CONTRAST:  157m OMNIPAQUE IOHEXOL 300 MG/ML  SOLN COMPARISON:  CT abdomen pelvis dated 11/14/2010. FINDINGS: Lower chest: Bibasilar subpleural streaky and reticular atelectasis or infiltrate. Aspiration is not excluded clinical correlation is recommended. There is coronary vascular calcification. No intra-abdominal free air or free fluid. Hepatobiliary: The liver is unremarkable. There is mild biliary dilatation, post cholecystectomy. No retained calcified stone noted in the central CBD. Pancreas: Unremarkable. No pancreatic ductal dilatation or surrounding inflammatory changes. Spleen: There is a 3.5 cm cyst in the spleen. The spleen is otherwise unremarkable. Adrenals/Urinary Tract: There adrenal glands are unremarkable. There is a 3 cm left renal inferior pole cyst. There is no hydronephrosis on either side. There is symmetric enhancement and excretion of contrast by both kidneys. The visualized ureters and urinary bladder appear unremarkable. Stomach/Bowel: There is sigmoid diverticulosis without active inflammatory changes. There is moderate stool throughout the colon. There is no bowel obstruction or active inflammation. The appendix is normal. Vascular/Lymphatic: Mild aortoiliac atherosclerotic disease. The IVC is unremarkable. No portal venous gas. No adenopathy.  Reproductive: The uterus is grossly unremarkable. No adnexal masses. Other: Ventral hernia repair mesh. Musculoskeletal: Osteopenia with degenerative changes of the spine. No acute osseous pathology. IMPRESSION: 1. No acute intra-abdominal or pelvic pathology. 2. Sigmoid diverticulosis. No bowel obstruction. Normal appendix. 3. Bibasilar subpleural streaky and reticular atelectasis or infiltrate. Aspiration is not excluded clinical correlation is recommended. 4.  Aortic Atherosclerosis (ICD10-I70.0). Electronically Signed   By: AAnner CreteM.D.   On: 07/04/2022 01:17   DG Abdomen 1 View  Result Date: 07/13/2022 CLINICAL DATA:  constipation EXAM: ABDOMEN - 1 VIEW COMPARISON:  CT angio chest 06/30/2022, CT abdomen pelvis 11/14/2010 FINDINGS: Limited evaluation due to overlapping osseous structures and overlying soft tissues. Stool  throughout the ascending colon. The bowel gas pattern is normal. Calcified splenic artery within left upper quadrant again noted. No radio-opaque calculi or other significant radiographic abnormality are seen. Surgical tacks related to a ventral wall hernia repair noted overlying the abdomen and pelvis. IMPRESSION: Nonobstructive bowel gas pattern. Electronically Signed   By: Iven Finn M.D.   On: 07/15/2022 23:09    Pending Labs Unresulted Labs (From admission, onward)     Start     Ordered   06/26/2022 0500  CBC  Tomorrow morning,   R        07/04/22 0846   07/07/2022 [id number]  Basic metabolic panel  Tomorrow morning,   R        07/04/22 0846   07/04/22 0250  Osmolality, urine  Add-on,   AD        07/04/22 0249   07/04/22 0248  Strep pneumoniae urinary antigen  Add-on,   AD        07/04/22 0248   07/04/22 0137  Blood culture (routine x 2)  BLOOD CULTURE X 2,   R (with STAT occurrences)      07/04/22 0136            Vitals/Pain Today's Vitals   07/04/22 0530 07/04/22 0536 07/04/22 1000 07/04/22 1021  BP:  105/72 (!) 132/98   Pulse: 88 96 76   Resp:  18 20    Temp:  97.6 F (36.4 C)  97.6 F (36.4 C)  TempSrc:  Oral  Oral  SpO2: 99% 98% 98%   PainSc:  10-Worst pain ever      Isolation Precautions No active isolations  Medications Medications  sodium chloride (PF) 0.9 % injection (  Canceled Entry 07/04/22 0347)  acetaminophen (TYLENOL) tablet 650 mg (650 mg Oral Given 07/04/22 0550)    Or  acetaminophen (TYLENOL) suppository 650 mg ( Rectal See Alternative 07/04/22 0550)  melatonin tablet 3 mg (has no administration in time range)  azithromycin (ZITHROMAX) 500 mg in sodium chloride 0.9 % 250 mL IVPB (has no administration in time range)  benzonatate (TESSALON) capsule 200 mg (has no administration in time range)  insulin glargine-yfgn (SEMGLEE) injection 8 Units (8 Units Subcutaneous Not Given 07/04/22 0901)  insulin aspart (novoLOG) injection 0-15 Units (5 Units Subcutaneous Given 07/04/22 0902)  albuterol (PROVENTIL) (2.5 MG/3ML) 0.083% nebulizer solution 2.5 mg (has no administration in time range)  pantoprazole (PROTONIX) EC tablet 40 mg (40 mg Oral Given 07/04/22 0903)  rivaroxaban (XARELTO) tablet 20 mg (has no administration in time range)  umeclidinium-vilanterol (ANORO ELLIPTA) 62.5-25 MCG/ACT 1 puff (1 puff Inhalation Not Given 07/04/22 0903)  predniSONE (DELTASONE) tablet 20 mg (20 mg Oral Given 07/04/22 0903)  cefTRIAXone (ROCEPHIN) 2 g in sodium chloride 0.9 % 100 mL IVPB (has no administration in time range)  oxyCODONE-acetaminophen (PERCOCET/ROXICET) 5-325 MG per tablet 1 tablet (1 tablet Oral Given 07/04/22 0902)  HYDROmorphone (DILAUDID) injection 0.5 mg (0.5 mg Intravenous Given 07/04/22 0851)  0.9 %  sodium chloride infusion (has no administration in time range)  fentaNYL (SUBLIMAZE) injection 50 mcg (50 mcg Intravenous Given 07/09/2022 2330)  acetaminophen (TYLENOL) tablet 1,000 mg (1,000 mg Oral Given 07/25/2022 2353)  iohexol (OMNIPAQUE) 300 MG/ML solution 100 mL (100 mLs Intravenous Contrast Given 07/04/22 0100)  sodium chloride 0.9 % bolus  1,000 mL (0 mLs Intravenous Stopped 07/04/22 0347)  cefTRIAXone (ROCEPHIN) 2 g in sodium chloride 0.9 % 100 mL IVPB (0 g Intravenous Stopped 07/04/22 0247)  azithromycin (ZITHROMAX) 500  mg in sodium chloride 0.9 % 250 mL IVPB (0 mg Intravenous Stopped 07/04/22 0544)    Mobility walks with device     Focused Assessments Cardiac Assessment Handoff:    Lab Results  Component Value Date   CKTOTAL 49 05/13/2022   CKMB 2.7 12/06/2010   TROPONINI <0.03 03/20/2017   Lab Results  Component Value Date   DDIMER 0.52 (H) 06/23/2022   Does the Patient currently have chest pain? No    R Recommendations: See Admitting Provider Note  Report given to:   Additional Notes:

## 2022-07-05 ENCOUNTER — Inpatient Hospital Stay (HOSPITAL_COMMUNITY): Payer: Medicare Other | Admitting: Certified Registered Nurse Anesthetist

## 2022-07-05 ENCOUNTER — Inpatient Hospital Stay (HOSPITAL_COMMUNITY): Payer: Medicare Other

## 2022-07-05 ENCOUNTER — Encounter (HOSPITAL_COMMUNITY): Payer: Self-pay | Admitting: Internal Medicine

## 2022-07-05 ENCOUNTER — Encounter (HOSPITAL_COMMUNITY): Admission: EM | Disposition: E | Payer: Self-pay | Source: Home / Self Care | Attending: Family Medicine

## 2022-07-05 ENCOUNTER — Other Ambulatory Visit: Payer: Self-pay

## 2022-07-05 DIAGNOSIS — G061 Intraspinal abscess and granuloma: Secondary | ICD-10-CM | POA: Diagnosis present

## 2022-07-05 DIAGNOSIS — I080 Rheumatic disorders of both mitral and aortic valves: Secondary | ICD-10-CM

## 2022-07-05 DIAGNOSIS — I509 Heart failure, unspecified: Secondary | ICD-10-CM | POA: Diagnosis not present

## 2022-07-05 DIAGNOSIS — J189 Pneumonia, unspecified organism: Secondary | ICD-10-CM | POA: Diagnosis not present

## 2022-07-05 DIAGNOSIS — R7881 Bacteremia: Secondary | ICD-10-CM

## 2022-07-05 DIAGNOSIS — B9562 Methicillin resistant Staphylococcus aureus infection as the cause of diseases classified elsewhere: Secondary | ICD-10-CM | POA: Diagnosis not present

## 2022-07-05 DIAGNOSIS — I110 Hypertensive heart disease with heart failure: Secondary | ICD-10-CM

## 2022-07-05 DIAGNOSIS — M4620 Osteomyelitis of vertebra, site unspecified: Secondary | ICD-10-CM

## 2022-07-05 DIAGNOSIS — G062 Extradural and subdural abscess, unspecified: Secondary | ICD-10-CM

## 2022-07-05 HISTORY — PX: THORACIC LAMINECTOMY FOR EPIDURAL ABSCESS: SHX6115

## 2022-07-05 LAB — BASIC METABOLIC PANEL
Anion gap: 12 (ref 5–15)
Anion gap: 16 — ABNORMAL HIGH (ref 5–15)
BUN: 22 mg/dL (ref 8–23)
BUN: 20 mg/dL (ref 8–23)
CO2: 25 mmol/L (ref 22–32)
CO2: 22 mmol/L (ref 22–32)
Calcium: 8.3 mg/dL — ABNORMAL LOW (ref 8.9–10.3)
Calcium: 8.3 mg/dL — ABNORMAL LOW (ref 8.9–10.3)
Chloride: 87 mmol/L — ABNORMAL LOW (ref 98–111)
Chloride: 89 mmol/L — ABNORMAL LOW (ref 98–111)
Creatinine, Ser: 0.86 mg/dL (ref 0.44–1.00)
Creatinine, Ser: 0.98 mg/dL (ref 0.44–1.00)
GFR, Estimated: >60 mL/min (ref >60)
GFR, Estimated: 55 mL/min — ABNORMAL LOW (ref >60)
Glucose, Bld: 206 mg/dL — ABNORMAL HIGH (ref 70–99)
Glucose, Bld: 125 mg/dL — ABNORMAL HIGH (ref 70–99)
Potassium: 3.8 mmol/L (ref 3.5–5.1)
Potassium: 3.7 mmol/L (ref 3.5–5.1)
Sodium: 124 mmol/L — ABNORMAL LOW (ref 135–145)
Sodium: 127 mmol/L — ABNORMAL LOW (ref 135–145)

## 2022-07-05 LAB — BLOOD CULTURE ID PANEL (REFLEXED) - BCID2

## 2022-07-05 LAB — CBC
HCT: 34 % — ABNORMAL LOW (ref 36.0–46.0)
HCT: 30 % — ABNORMAL LOW (ref 36.0–46.0)
Hemoglobin: 10.7 g/dL — ABNORMAL LOW (ref 12.0–15.0)
Hemoglobin: 9.5 g/dL — ABNORMAL LOW (ref 12.0–15.0)
MCH: 28.5 pg (ref 26.0–34.0)
MCH: 28.3 pg (ref 26.0–34.0)
MCHC: 31.5 g/dL (ref 30.0–36.0)
MCHC: 31.7 g/dL (ref 30.0–36.0)
MCV: 90.4 fL (ref 80.0–100.0)
MCV: 89.3 fL (ref 80.0–100.0)
Platelets: UNDETERMINED 10*3/uL (ref 150–400)
Platelets: UNDETERMINED 10*3/uL (ref 150–400)
RBC: 3.76 MIL/uL — ABNORMAL LOW (ref 3.87–5.11)
RBC: 3.36 MIL/uL — ABNORMAL LOW (ref 3.87–5.11)
RDW: 15.1 % (ref 11.5–15.5)
RDW: 15.1 % (ref 11.5–15.5)
WBC: 23.6 10*3/uL — ABNORMAL HIGH (ref 4.0–10.5)
WBC: 26.3 10*3/uL — ABNORMAL HIGH (ref 4.0–10.5)
nRBC: 0 % (ref 0.0–0.2)
nRBC: 0 % (ref 0.0–0.2)

## 2022-07-05 LAB — SURGICAL PCR SCREEN
MRSA, PCR: POSITIVE — AB
Staphylococcus aureus: POSITIVE — AB

## 2022-07-05 LAB — GLUCOSE, CAPILLARY
Glucose-Capillary: 205 mg/dL — ABNORMAL HIGH (ref 70–99)
Glucose-Capillary: 268 mg/dL — ABNORMAL HIGH (ref 70–99)
Glucose-Capillary: 161 mg/dL — ABNORMAL HIGH (ref 70–99)
Glucose-Capillary: 81 mg/dL (ref 70–99)
Glucose-Capillary: 139 mg/dL — ABNORMAL HIGH (ref 70–99)

## 2022-07-05 LAB — ABO/RH: ABO/RH(D): O POS

## 2022-07-05 LAB — MAGNESIUM: Magnesium: 1.8 mg/dL (ref 1.7–2.4)

## 2022-07-05 SURGERY — THORACIC LAMINECTOMY FOR EPIDURAL ABSCESS
Anesthesia: General | Site: Back

## 2022-07-05 MED ORDER — AMLODIPINE BESYLATE 10 MG PO TABS
10.0000 mg | ORAL_TABLET | Freq: Every day | ORAL | Status: DC
  Administered 2022-07-05: 10 mg via ORAL
  Filled 2022-07-05: qty 1

## 2022-07-05 MED ORDER — ISOSORBIDE MONONITRATE ER 60 MG PO TB24
60.0000 mg | ORAL_TABLET | Freq: Every day | ORAL | Status: DC
  Administered 2022-07-05 – 2022-07-19 (×15): 60 mg via ORAL
  Filled 2022-07-05: qty 1
  Filled 2022-07-05: qty 2
  Filled 2022-07-05 (×3): qty 1
  Filled 2022-07-05: qty 2
  Filled 2022-07-05: qty 1
  Filled 2022-07-05: qty 2
  Filled 2022-07-05 (×5): qty 1
  Filled 2022-07-05: qty 2
  Filled 2022-07-05 (×2): qty 1

## 2022-07-05 MED ORDER — ONDANSETRON HCL 4 MG/2ML IJ SOLN: 4.0000 mg | Freq: Four times a day (QID) | INTRAMUSCULAR | Status: DC | PRN

## 2022-07-05 MED ORDER — PHENOL 1.4 % MT LIQD: 1.0000 | OROMUCOSAL | Status: DC | PRN

## 2022-07-05 MED ORDER — AMLODIPINE BESYLATE 10 MG PO TABS
10.0000 mg | ORAL_TABLET | Freq: Every day | ORAL | Status: DC
  Administered 2022-07-05 – 2022-07-19 (×15): 10 mg via ORAL
  Filled 2022-07-05 (×15): qty 1

## 2022-07-05 MED ORDER — VANCOMYCIN HCL 1500 MG/300ML IV SOLN
1500.0000 mg | Freq: Once | INTRAVENOUS | Status: AC
  Administered 2022-07-05: 1500 mg via INTRAVENOUS
  Filled 2022-07-05: qty 300

## 2022-07-05 MED ORDER — VANCOMYCIN HCL 1000 MG IV SOLR
INTRAVENOUS | Status: DC | PRN
  Administered 2022-07-05: 1000 mg

## 2022-07-05 MED ORDER — SODIUM CHLORIDE 0.9 % IV SOLN: INTRAVENOUS | Status: DC | PRN

## 2022-07-05 MED ORDER — VANCOMYCIN HCL IN DEXTROSE 1-5 GM/200ML-% IV SOLN
1000.0000 mg | Freq: Every day | INTRAVENOUS | Status: DC
  Administered 2022-07-06 – 2022-07-08 (×3): 1000 mg via INTRAVENOUS
  Filled 2022-07-05 (×3): qty 200

## 2022-07-05 MED ORDER — VANCOMYCIN HCL 750 MG/150ML IV SOLN: 750.0000 mg | Freq: Every day | INTRAVENOUS | Status: DC

## 2022-07-05 MED ORDER — CHLORHEXIDINE GLUCONATE 0.12 % MT SOLN: 15.0000 mL | Freq: Once | OROMUCOSAL | Status: AC

## 2022-07-05 MED ORDER — FENTANYL CITRATE (PF) 250 MCG/5ML IJ SOLN
INTRAMUSCULAR | Status: DC | PRN
  Administered 2022-07-05: 100 ug via INTRAVENOUS

## 2022-07-05 MED ORDER — ROCURONIUM BROMIDE 10 MG/ML (PF) SYRINGE
PREFILLED_SYRINGE | INTRAVENOUS | Status: DC | PRN
  Administered 2022-07-05: 50 mg via INTRAVENOUS

## 2022-07-05 MED ORDER — ONDANSETRON HCL 4 MG PO TABS: 4.0000 mg | ORAL_TABLET | Freq: Four times a day (QID) | ORAL | Status: DC | PRN

## 2022-07-05 MED ORDER — PROTHROMBIN COMPLEX CONC HUMAN 500 UNITS IV KIT
2174.0000 [IU] | PACK | Status: AC
  Administered 2022-07-05: 2174 [IU] via INTRAVENOUS
  Filled 2022-07-05: qty 2174

## 2022-07-05 MED ORDER — LACTATED RINGERS IV SOLN: INTRAVENOUS | Status: DC

## 2022-07-05 MED ORDER — GADOBUTROL 1 MMOL/ML IV SOLN
8.0000 mL | Freq: Once | INTRAVENOUS | Status: AC | PRN
  Administered 2022-07-05: 8 mL via INTRAVENOUS

## 2022-07-05 MED ORDER — DROPERIDOL 2.5 MG/ML IJ SOLN: 0.6250 mg | Freq: Once | INTRAMUSCULAR | Status: DC | PRN

## 2022-07-05 MED ORDER — CHLORHEXIDINE GLUCONATE CLOTH 2 % EX PADS: 6.0000 | MEDICATED_PAD | Freq: Every day | CUTANEOUS | Status: DC

## 2022-07-05 MED ORDER — INSULIN ASPART 100 UNIT/ML IJ SOLN: 0.0000 [IU] | INTRAMUSCULAR | Status: DC | PRN

## 2022-07-05 MED ORDER — THROMBIN 20000 UNITS EX SOLR
CUTANEOUS | Status: AC
  Filled 2022-07-05: qty 20000

## 2022-07-05 MED ORDER — THROMBIN 20000 UNITS EX SOLR
CUTANEOUS | Status: DC | PRN
  Administered 2022-07-05: 20 mL via TOPICAL

## 2022-07-05 MED ORDER — SUCCINYLCHOLINE CHLORIDE 200 MG/10ML IV SOSY
PREFILLED_SYRINGE | INTRAVENOUS | Status: AC
  Filled 2022-07-05: qty 10

## 2022-07-05 MED ORDER — 0.9 % SODIUM CHLORIDE (POUR BTL) OPTIME
TOPICAL | Status: DC | PRN
  Administered 2022-07-05: 1000 mL

## 2022-07-05 MED ORDER — HYDROMORPHONE HCL 1 MG/ML IJ SOLN
1.0000 mg | INTRAMUSCULAR | Status: DC | PRN
  Administered 2022-07-05 – 2022-07-15 (×11): 1 mg via INTRAVENOUS
  Filled 2022-07-05 (×12): qty 1

## 2022-07-05 MED ORDER — VANCOMYCIN HCL IN DEXTROSE 1-5 GM/200ML-% IV SOLN: 1000.0000 mg | INTRAVENOUS | Status: DC

## 2022-07-05 MED ORDER — MUPIROCIN 2 % EX OINT
1.0000 | TOPICAL_OINTMENT | Freq: Two times a day (BID) | CUTANEOUS | Status: AC
  Administered 2022-07-05 – 2022-07-10 (×10): 1 via NASAL
  Filled 2022-07-05 (×3): qty 22

## 2022-07-05 MED ORDER — SODIUM CHLORIDE 0.9% FLUSH
3.0000 mL | Freq: Two times a day (BID) | INTRAVENOUS | Status: DC
  Administered 2022-07-05 – 2022-07-18 (×23): 3 mL via INTRAVENOUS

## 2022-07-05 MED ORDER — ACETAMINOPHEN 325 MG PO TABS: 650.0000 mg | ORAL_TABLET | ORAL | Status: DC | PRN

## 2022-07-05 MED ORDER — SODIUM CHLORIDE 0.9 % IV SOLN: 250.0000 mL | INTRAVENOUS | Status: DC

## 2022-07-05 MED ORDER — RINGERS IV SOLN: INTRAVENOUS | Status: DC

## 2022-07-05 MED ORDER — FENTANYL CITRATE (PF) 100 MCG/2ML IJ SOLN
25.0000 ug | INTRAMUSCULAR | Status: DC | PRN
  Administered 2022-07-05: 50 ug via INTRAVENOUS

## 2022-07-05 MED ORDER — SODIUM CHLORIDE 0.9 % IV SOLN
2.0000 g | Freq: Two times a day (BID) | INTRAVENOUS | Status: DC
  Administered 2022-07-05 – 2022-07-06 (×2): 2 g via INTRAVENOUS
  Filled 2022-07-05 (×2): qty 20

## 2022-07-05 MED ORDER — SODIUM CHLORIDE 0.9% FLUSH
3.0000 mL | INTRAVENOUS | Status: DC | PRN
  Administered 2022-07-15: 3 mL via INTRAVENOUS

## 2022-07-05 MED ORDER — CHLORHEXIDINE GLUCONATE 0.12 % MT SOLN
OROMUCOSAL | Status: AC
  Administered 2022-07-05: 15 mL via OROMUCOSAL
  Filled 2022-07-05: qty 15

## 2022-07-05 MED ORDER — SUCCINYLCHOLINE CHLORIDE 200 MG/10ML IV SOSY
PREFILLED_SYRINGE | INTRAVENOUS | Status: DC | PRN
  Administered 2022-07-05: 120 mg via INTRAVENOUS

## 2022-07-05 MED ORDER — PROTHROMBIN COMPLEX CONC HUMAN 500 UNITS IV KIT: 50.0000 [IU]/kg | PACK | Status: DC

## 2022-07-05 MED ORDER — FENTANYL CITRATE (PF) 100 MCG/2ML IJ SOLN
INTRAMUSCULAR | Status: AC
  Filled 2022-07-05: qty 2

## 2022-07-05 MED ORDER — HYDROCODONE-ACETAMINOPHEN 5-325 MG PO TABS
1.0000 | ORAL_TABLET | ORAL | Status: DC | PRN
  Administered 2022-07-05: 1 via ORAL
  Filled 2022-07-05: qty 1

## 2022-07-05 MED ORDER — FENTANYL CITRATE (PF) 250 MCG/5ML IJ SOLN
INTRAMUSCULAR | Status: AC
  Filled 2022-07-05: qty 5

## 2022-07-05 MED ORDER — ACETAMINOPHEN 650 MG RE SUPP: 650.0000 mg | RECTAL | Status: DC | PRN

## 2022-07-05 MED ORDER — ONDANSETRON HCL 4 MG/2ML IJ SOLN
INTRAMUSCULAR | Status: DC | PRN
  Administered 2022-07-05: 4 mg via INTRAVENOUS

## 2022-07-05 MED ORDER — CHLORHEXIDINE GLUCONATE CLOTH 2 % EX PADS
6.0000 | MEDICATED_PAD | Freq: Every day | CUTANEOUS | Status: DC
  Administered 2022-07-05 – 2022-07-19 (×16): 6 via TOPICAL

## 2022-07-05 MED ORDER — THROMBIN 5000 UNITS EX SOLR
OROMUCOSAL | Status: DC | PRN
  Administered 2022-07-05: 5 mL via TOPICAL

## 2022-07-05 MED ORDER — METHOCARBAMOL 500 MG PO TABS
500.0000 mg | ORAL_TABLET | Freq: Four times a day (QID) | ORAL | Status: DC | PRN
  Administered 2022-07-05 – 2022-07-18 (×11): 500 mg via ORAL
  Filled 2022-07-05 (×11): qty 1

## 2022-07-05 MED ORDER — ORAL CARE MOUTH RINSE: 15.0000 mL | Freq: Once | OROMUCOSAL | Status: AC

## 2022-07-05 MED ORDER — ACETAMINOPHEN 10 MG/ML IV SOLN
INTRAVENOUS | Status: DC | PRN
  Administered 2022-07-05: 1000 mg via INTRAVENOUS

## 2022-07-05 MED ORDER — MENTHOL 3 MG MT LOZG: 1.0000 | LOZENGE | OROMUCOSAL | Status: DC | PRN

## 2022-07-05 MED ORDER — SODIUM CHLORIDE 0.9 % IV SOLN: INTRAVENOUS | Status: DC

## 2022-07-05 MED ORDER — MORPHINE SULFATE (PF) 2 MG/ML IV SOLN: 2.0000 mg | INTRAVENOUS | Status: DC | PRN

## 2022-07-05 MED ORDER — SUGAMMADEX SODIUM 200 MG/2ML IV SOLN
INTRAVENOUS | Status: DC | PRN
  Administered 2022-07-05: 333.6 mg via INTRAVENOUS

## 2022-07-05 MED ORDER — PROPOFOL 10 MG/ML IV BOLUS
INTRAVENOUS | Status: DC | PRN
  Administered 2022-07-05: 100 mg via INTRAVENOUS

## 2022-07-05 MED ORDER — PROTHROMBIN COMPLEX CONC HUMAN 500 UNITS IV KIT
25.0000 [IU]/kg | PACK | Status: DC
  Filled 2022-07-05: qty 2085

## 2022-07-05 MED ORDER — VANCOMYCIN HCL 1000 MG IV SOLR
INTRAVENOUS | Status: AC
  Filled 2022-07-05: qty 20

## 2022-07-05 MED ORDER — HYDROCODONE-ACETAMINOPHEN 10-325 MG PO TABS
2.0000 | ORAL_TABLET | ORAL | Status: DC | PRN
  Administered 2022-07-06 – 2022-07-15 (×18): 2 via ORAL
  Filled 2022-07-05 (×20): qty 2

## 2022-07-05 MED ORDER — THROMBIN 5000 UNITS EX SOLR
CUTANEOUS | Status: AC
  Filled 2022-07-05: qty 5000

## 2022-07-05 MED ORDER — EMPTY CONTAINERS FLEXIBLE MISC: 1800.0000 mg | Freq: Once | Status: DC

## 2022-07-05 SURGICAL SUPPLY — 56 items
BAG COUNTER SPONGE SURGICOUNT (BAG) ×1 IMPLANT
BAND RUBBER #18 3X1/16 STRL (MISCELLANEOUS) IMPLANT
BENZOIN TINCTURE PRP APPL 2/3 (GAUZE/BANDAGES/DRESSINGS) IMPLANT
BLADE SURG 11 STRL SS (BLADE) ×1 IMPLANT
BUR ACRON 5.0MM COATED (BURR) IMPLANT
BUR CUTTER 7.0 ROUND (BURR) IMPLANT
BUR MATCHSTICK NEURO 3.0 LAGG (BURR) IMPLANT
CANISTER SUCT 3000ML PPV (MISCELLANEOUS) ×1 IMPLANT
CATH ROBINSON RED A/P 8FR (CATHETERS) IMPLANT
CLIP TI MEDIUM 6 (CLIP) IMPLANT
DERMABOND ADVANCED .7 DNX6 (GAUZE/BANDAGES/DRESSINGS) IMPLANT
DRAPE LAPAROTOMY 100X72 PEDS (DRAPES) IMPLANT
DRAPE LAPAROTOMY 100X72X124 (DRAPES) IMPLANT
DRAPE MICROSCOPE SLANT 54X150 (MISCELLANEOUS) IMPLANT
DRSG OPSITE POSTOP 4X8 (GAUZE/BANDAGES/DRESSINGS) IMPLANT
ELECT REM PT RETURN 9FT ADLT (ELECTROSURGICAL) ×1
ELECTRODE REM PT RTRN 9FT ADLT (ELECTROSURGICAL) ×1 IMPLANT
GAUZE 4X4 16PLY ~~LOC~~+RFID DBL (SPONGE) IMPLANT
GAUZE SPONGE 4X4 12PLY STRL (GAUZE/BANDAGES/DRESSINGS) IMPLANT
GLOVE BIOGEL PI IND STRL 7.5 (GLOVE) ×1 IMPLANT
GLOVE ECLIPSE 7.5 STRL STRAW (GLOVE) ×1 IMPLANT
GLOVE EXAM NITRILE XL STR (GLOVE) IMPLANT
GOWN STRL REUS W/ TWL LRG LVL3 (GOWN DISPOSABLE) IMPLANT
GOWN STRL REUS W/ TWL XL LVL3 (GOWN DISPOSABLE) IMPLANT
GOWN STRL REUS W/TWL 2XL LVL3 (GOWN DISPOSABLE) IMPLANT
GOWN STRL REUS W/TWL LRG LVL3 (GOWN DISPOSABLE) ×2
GOWN STRL REUS W/TWL XL LVL3 (GOWN DISPOSABLE) ×1
HEMOSTAT SURGICEL 2X14 (HEMOSTASIS) IMPLANT
KIT BASIN OR (CUSTOM PROCEDURE TRAY) ×1 IMPLANT
KIT TURNOVER KIT B (KITS) ×1 IMPLANT
NDL SPNL 22GX3.5 QUINCKE BK (NEEDLE) ×1 IMPLANT
NEEDLE SPNL 22GX3.5 QUINCKE BK (NEEDLE) IMPLANT
NS IRRIG 1000ML POUR BTL (IV SOLUTION) ×1 IMPLANT
PACK LAMINECTOMY NEURO (CUSTOM PROCEDURE TRAY) ×1 IMPLANT
PAD ARMBOARD 7.5X6 YLW CONV (MISCELLANEOUS) ×3 IMPLANT
PATTIES SURGICAL .25X.25 (GAUZE/BANDAGES/DRESSINGS) IMPLANT
PATTIES SURGICAL .5 X3 (DISPOSABLE) IMPLANT
PATTIES SURGICAL 1/4 X 3 (GAUZE/BANDAGES/DRESSINGS) IMPLANT
PENCIL BUTTON HOLSTER BLD 10FT (ELECTRODE) IMPLANT
SPECIMEN JAR SMALL (MISCELLANEOUS) IMPLANT
SPONGE NEURO XRAY DETECT 1X3 (DISPOSABLE) IMPLANT
SPONGE SURGIFOAM ABS GEL 100 (HEMOSTASIS) ×1 IMPLANT
SPONGE T-LAP 4X18 ~~LOC~~+RFID (SPONGE) IMPLANT
STRIP CLOSURE SKIN 1/2X4 (GAUZE/BANDAGES/DRESSINGS) IMPLANT
SUT PROLENE 6 0 BV (SUTURE) IMPLANT
SUT VIC AB 0 CT1 18XCR BRD8 (SUTURE) ×1 IMPLANT
SUT VIC AB 0 CT1 8-18 (SUTURE) ×2
SUT VIC AB 2-0 CP2 18 (SUTURE) ×1 IMPLANT
SUT VIC AB 2-0 CT1 18 (SUTURE) IMPLANT
SUT VIC AB 3-0 SH 8-18 (SUTURE) IMPLANT
SWAB COLLECTION DEVICE MRSA (MISCELLANEOUS) IMPLANT
SWAB CULTURE ESWAB REG 1ML (MISCELLANEOUS) IMPLANT
TOWEL GREEN STERILE (TOWEL DISPOSABLE) ×1 IMPLANT
TOWEL GREEN STERILE FF (TOWEL DISPOSABLE) ×1 IMPLANT
TRAY FOLEY MTR SLVR 16FR STAT (SET/KITS/TRAYS/PACK) IMPLANT
WATER STERILE IRR 1000ML POUR (IV SOLUTION) ×1 IMPLANT

## 2022-07-05 NOTE — Consult Note (Signed)
Neurosurgery Consultation  Reason for Consult: Epidural abscess Referring Physician: Pokhrel  CC: back pain  HPI: This is a 89 y.o. woman with COPD on 4L [place] at baseline, CHF, that presents as a transfer due to epidural abscess, was admitted to [facility] for CAP. She does endorse some changes in bladder function, she's unsure about weakness as she's been globally weak due to the illness, but denies any sudden worsening in function that she is aware of.   ROS: A 14 point ROS was performed and is negative except as noted in the HPI.   PMHx:  Past Medical History:  Diagnosis Date   Cellulitis and abscess of left leg    CHF (congestive heart failure) (HCC)    COPD (chronic obstructive pulmonary disease) (HCC)    Diabetes mellitus    Glaucoma    Hyperlipidemia    Hypertension    FamHx:  Family History  Problem Relation Age of Onset   Stroke Mother    SocHx:  reports that she has never smoked. She has never used smokeless tobacco. She reports that she does not drink alcohol and does not use drugs.  Exam: Vital signs in last 24 hours: Temp:  [97.7 F (36.5 C)-97.8 F (36.6 C)] 97.8 F (36.6 C) (03/10 1412) Pulse Rate:  [90-100] 99 (03/10 1412) Resp:  [16-18] 18 (03/10 1228) BP: (103-161)/(60-80) 103/66 (03/10 1412) SpO2:  [96 %-98 %] 96 % (03/10 1412) Weight:  [83.4 kg] 83.4 kg (03/10 1419) General: Awake, alert, cooperative, lying in bed in NAD Head: Normocephalic and atruamatic HEENT: Neck supple Pulmonary: breathing supplemental O2 via [place] with baseline inc'd WOB but appears comfortable Cardiac: mildly tachy, regular  Abdomen: obese S NT ND Extremities: Warm and well perfused x4 Neuro: Very hard of hearing, Fcx4 with at least 4/5 in both upper extremities, lower extremities are closer to 3/5 to 4-/5, reflexes mute x4, no hoffman's, 2bts b/l ankle clonus   Assessment and Plan: 89 y.o. woman w/ CAP and severe back pain. MRI T/L spine personally reviewed, which shows diffuse  epidural abscess worst in the caudal thoracic spine.   -OR for emergent evacuation -had a long and very blunt conversation with the patient and her family, extraordinarily high risk but without intervention I think the abscess will not respond to Abx and likely will progress to worsening neurologic function -will d/w anesthesia but suspect she will need to be in the unit post-op  -will obtain cultures intra-operatively  Judith Part, MD 07/18/2022 3:06 PM [facility]

## 2022-07-05 NOTE — Progress Notes (Signed)
PT Cancellation Note  Patient Details Name: [name] MRN: [id number] DOB: Dec 30, 1930   Cancelled Treatment:    Reason Eval/Treat Not Completed: Patient at procedure or test/unavailable    Doreatha Massed, PT Acute Rehabilitation  Office: [phone]

## 2022-07-05 NOTE — Brief Op Note (Signed)
07/24/2022 - 07/06/2022  6:01 PM  PATIENT:  [name]  89 y.o. female  PRE-OPERATIVE DIAGNOSIS:  Epidural abscess  POST-OPERATIVE DIAGNOSIS:  Epidural abscess  PROCEDURE:  Procedure(s): THORACIC NINE, THORACIC TEN, THORACIC ELEVEN, LAMINECTOMY FOR EPIDURAL ABSCESS (N/A)  SURGEON:  Surgeon(s) and Role:    * Earnie Larsson, MD - Primary  PHYSICIAN ASSISTANT:   ASSISTANTSMearl Latin   ANESTHESIA:   general  EBL:  100 mL   BLOOD ADMINISTERED:none  DRAINS: (med) Hemovact drain(s) in the deep wound space with  Suction Open   LOCAL MEDICATIONS USED:  NONE  SPECIMEN:  No Specimen  DISPOSITION OF SPECIMEN:  N/A  COUNTS:  YES  TOURNIQUET:  * No tourniquets in log *  DICTATION: .Dragon Dictation  PLAN OF CARE: Admit to inpatient   PATIENT DISPOSITION:  PACU - guarded condition.   Delay start of Pharmacological VTE agent (>24hrs) due to surgical blood loss or risk of bleeding: yes

## 2022-07-05 NOTE — Progress Notes (Signed)
PHARMACY - PHYSICIAN COMMUNICATION CRITICAL VALUE ALERT - BLOOD CULTURE IDENTIFICATION (BCID)  [name] is an 89 y.o. female who presented to [facility] on 07/03/2022 with a chief complaint of chest discomfort and found to have CAP  Assessment:  MRSA bacteremia  Name of physician (or Provider) Contacted: Adhikari  Current antibiotics: rocephin/zithromax  Changes to prescribed antibiotics recommended:  Add Vanc   Results for orders placed or performed during the hospital encounter of 07/03/22  Blood Culture ID Panel (Reflexed) (Collected: 07/04/2022  6:56 AM)  Result Value Ref Range   Enterococcus faecalis NOT DETECTED NOT DETECTED   Enterococcus Faecium NOT DETECTED NOT DETECTED   Listeria monocytogenes NOT DETECTED NOT DETECTED   Staphylococcus species DETECTED (A) NOT DETECTED   Staphylococcus aureus (BCID) DETECTED (A) NOT DETECTED   Staphylococcus epidermidis NOT DETECTED NOT DETECTED   Staphylococcus lugdunensis NOT DETECTED NOT DETECTED   Streptococcus species NOT DETECTED NOT DETECTED   Streptococcus agalactiae NOT DETECTED NOT DETECTED   Streptococcus pneumoniae NOT DETECTED NOT DETECTED   Streptococcus pyogenes NOT DETECTED NOT DETECTED   A.calcoaceticus-baumannii NOT DETECTED NOT DETECTED   Bacteroides fragilis NOT DETECTED NOT DETECTED   Enterobacterales NOT DETECTED NOT DETECTED   Enterobacter cloacae complex NOT DETECTED NOT DETECTED   Escherichia coli NOT DETECTED NOT DETECTED   Klebsiella aerogenes NOT DETECTED NOT DETECTED   Klebsiella oxytoca NOT DETECTED NOT DETECTED   Klebsiella pneumoniae NOT DETECTED NOT DETECTED   Proteus species NOT DETECTED NOT DETECTED   Salmonella species NOT DETECTED NOT DETECTED   Serratia marcescens NOT DETECTED NOT DETECTED   Haemophilus influenzae NOT DETECTED NOT DETECTED   Neisseria meningitidis NOT DETECTED NOT DETECTED   Pseudomonas aeruginosa NOT DETECTED NOT DETECTED   Stenotrophomonas maltophilia NOT DETECTED NOT  DETECTED   Candida albicans NOT DETECTED NOT DETECTED   Candida auris NOT DETECTED NOT DETECTED   Candida glabrata NOT DETECTED NOT DETECTED   Candida krusei NOT DETECTED NOT DETECTED   Candida parapsilosis NOT DETECTED NOT DETECTED   Candida tropicalis NOT DETECTED NOT DETECTED   Cryptococcus neoformans/gattii NOT DETECTED NOT DETECTED   Meth resistant mecA/C and MREJ DETECTED (A) NOT DETECTED    Kara Mead 07/05/2022  7:35 AM

## 2022-07-05 NOTE — Anesthesia Postprocedure Evaluation (Signed)
Anesthesia Post Note  Patient: [name]  Procedure(s) Performed: THORACIC NINE, THORACIC TEN, THORACIC ELEVEN, LAMINECTOMY FOR EPIDURAL ABSCESS (Back)     Patient location during evaluation: PACU Anesthesia Type: General Level of consciousness: sedated and patient cooperative Pain management: pain level controlled Vital Signs Assessment: post-procedure vital signs reviewed and stable Respiratory status: spontaneous breathing Cardiovascular status: stable Anesthetic complications: no   No notable events documented.  Last Vitals:  Vitals:   07/05/22 1900 07/05/22 2000  BP: 134/75   Pulse: 94   Resp: 10   Temp:  37 C  SpO2: 94%     Last Pain:  Vitals:   07/05/22 2000  TempSrc: Oral  PainSc:                  Nolon Nations

## 2022-07-05 NOTE — Anesthesia Procedure Notes (Signed)
Central Venous Catheter Insertion Performed by: Nolon Nations, MD, anesthesiologist Start/End3/01/2023 3:15 PM, 07/05/2022 3:35 PM Patient location: Pre-op. Preanesthetic checklist: patient identified, IV checked, site marked, risks and benefits discussed, surgical consent, monitors and equipment checked, pre-op evaluation, timeout performed and anesthesia consent Position: Trendelenburg Lidocaine 1% used for infiltration and patient sedated Hand hygiene performed  and maximum sterile barriers used  Catheter size: 8 Fr Total catheter length 16. Central line was placed.Double lumen Procedure performed using ultrasound guided technique. Ultrasound Notes:image(s) printed for medical record Attempts: 1 Following insertion, dressing applied, line sutured and Biopatch. Post procedure assessment: blood return through all ports, free fluid flow and no air  Patient tolerated the procedure well with no immediate complications.

## 2022-07-05 NOTE — Anesthesia Preprocedure Evaluation (Addendum)
Anesthesia Evaluation  Patient identified by MRN, date of birth, ID band Patient awake    Reviewed: Allergy & Precautions, H&P , NPO status , Patient's Chart, lab work & pertinent test results  Airway Mallampati: III  TM Distance: >3 FB Neck ROM: Full    Dental  (+) Dental Advisory Given, Poor Dentition, Missing   Pulmonary sleep apnea , pneumonia, COPD, Patient abstained from smoking.   Pulmonary exam normal breath sounds clear to auscultation       Cardiovascular hypertension, Pt. on medications pulmonary hypertension+CHF  Normal cardiovascular exam+ Valvular Problems/Murmurs (TR) AS and MR  Rhythm:Regular Rate:Normal  Echocardiogram 10/19/2019:  Normal LV systolic function with visual EF 55-60%. Left ventricle cavity is normal in size. Normal global wall motion. Doppler evidence of grade II diastolic dysfunction, elevated LAP.  Left atrial cavity is mildly dilated.  Mild (Grade I) aortic regurgitation.  Aortic sclerosis without stenosis.  Mild (Grade I) mitral regurgitation.  Mild tricuspid regurgitation. Mild pulmonary hypertension. RVSP measures 44 mmHg.    Echocardiogram 11/02/2018:  Normal LV systolic function with EF 61%. Left ventricle cavity is normal  in size. Normal left ventricular wall thickness. Normal global wall  motion. Doppler evidence of grade I (impaired) diastolic dysfunction,  normal LAP.  Trileaflet aortic valve with mild aortic valve leaflet calcification. Mild  aortic valve stenosis. Aortic valve mean gradient of 11 mmHg, Vmax of 2.3   m/s. Calculated aortic valve area by continuity equation is 1.3 cm. Mild  (Grade I) aortic regurgitation.  Mild to moderate mitral regurgitation.  Moderate tricuspid regurgitation. Estimated pulmonary artery systolic  pressure is 46 mmHg.  Compared to previous study on 10/25/2016, tricuspid regurgitation is  increased in severity. Pulmonary hypertension is new.     Echo 10/2016 - Left ventricle: The cavity size was normal. There was mild concentric hypertrophy. Systolic function was normal. The estimated ejection fraction was in the range of 60% to 65%. Wall motion was normal; there were no regional wall motion abnormalities. Doppler parameters are consistent with abnormal left ventricular relaxation (grade 1 diastolic dysfunction).  - Aortic valve: Transvalvular velocity was increased, due to stenosis. There was mild to moderate stenosis. There was trivial   regurgitation. Valve area (VTI): 1.25 cm^2. Valve area (Vmax):   1.11 cm^2. Valve area (Vmean): 1.31 cm^2.  - Mitral valve: Calcified annulus. Moderately calcified leaflets  posterior. There was mild regurgitation.  - Left atrium: The atrium was mildly dilated.  - Right atrium: The atrium was mildly dilated.     Neuro/Psych  PSYCHIATRIC DISORDERS Anxiety     negative neurological ROS     GI/Hepatic negative GI ROS, Neg liver ROS,,,  Endo/Other  diabetes    Renal/GU Renal disease     Musculoskeletal negative musculoskeletal ROS (+) Arthritis ,    Abdominal  (+) + obese  Peds  Hematology  (+) Blood dyscrasia, anemia   Anesthesia Other Findings   Reproductive/Obstetrics negative OB ROS                             Anesthesia Physical Anesthesia Plan  ASA: 4 and emergent  Anesthesia Plan: General   Post-op Pain Management:    Induction: Intravenous, Rapid sequence and Cricoid pressure planned  PONV Risk Score and Plan: Ondansetron, Dexamethasone and Treatment may vary due to age or medical condition  Airway Management Planned: Oral ETT  Additional Equipment: Arterial line, CVP and Ultrasound Guidance Line Placement  Intra-op Plan:  Post-operative Plan: Possible Post-op intubation/ventilation  Informed Consent: I have reviewed the patients History and Physical, chart, labs and discussed the procedure including the risks, benefits and  alternatives for the proposed anesthesia with the patient or authorized representative who has indicated his/her understanding and acceptance.     Dental advisory given  Plan Discussed with: CRNA  Anesthesia Plan Comments:         Anesthesia Quick Evaluation

## 2022-07-05 NOTE — Evaluation (Signed)
Occupational Therapy Evaluation Patient Details Name: [name] MRN: [id number] DOB: 1930-09-06 Today's Date: 07/24/2022   History of Present Illness Patient is a 89 year old female with history of chronic respiratory failure on 4 L of oxygen secondary to COPD, diabetes type 2, chronic diastolic CHF, hypertension, DVT on Xarelto who presented from home with abdominal pain - with no BM x 1 week. Also had a nonproductive cough anc chest discomfort. Patient found to have pneumonia. Patient also complaining of back pain at cite of lipoma.   Clinical Impression   [name] is an 89 year old woman who is typically independent at home without a device in the home. She lives with her daughter and husband - her daughter works. She has had a decline in functional abilities since her prior admission in January when she had COVID. She has been admitted to the hospital multiple times this year. Patient presents with generalized weakness, decreased activity tolerance and impaired balance. She is not alert to time.She is supervision to transfer to edge of bed, min assist to stand and min guard to take steps to recliner. She reports pain in back 10/10 at cite of lipoma. She is found on 4 L [place] with o2 sat WNL. Patient has discharged home with [facility] after each admission -- as she improves typically in hospital. However, she has never returned to her initial baseline prior to [place]. Therapist recommends attempt short term rehab this time -  for more aggressive rehab to improve her physical strength and endurance and functional independence prior to return home.      Recommendations for follow up therapy are one component of a multi-disciplinary discharge planning process, led by the attending physician.  Recommendations may be updated based on patient status, additional functional criteria and insurance authorization.   Follow Up Recommendations  Skilled nursing-short term rehab (<3 hours/day)      Assistance Recommended at Discharge Frequent or constant Supervision/Assistance  Patient can return home with the following A little help with walking and/or transfers;A lot of help with bathing/dressing/bathroom;Assistance with cooking/housework;Direct supervision/assist for medications management;Direct supervision/assist for financial management;Help with stairs or ramp for entrance;Assist for transportation    Functional Status Assessment  Patient has had a recent decline in their functional status and demonstrates the ability to make significant improvements in function in a reasonable and predictable amount of time.  Equipment Recommendations  None recommended by OT    Recommendations for Other Services       Precautions / Restrictions Precautions Precautions: Fall Restrictions Weight Bearing Restrictions: No      Mobility Bed Mobility Overal bed mobility: Needs Assistance Bed Mobility: Supine to Sit     Supine to sit: Min guard, HOB elevated          Transfers Overall transfer level: Needs assistance Equipment used: Rolling walker (2 wheels) Transfers: Sit to/from Stand, Bed to chair/wheelchair/BSC Sit to Stand: Min assist     Step pivot transfers: Min guard     General transfer comment: Min assist to power up and min guard to take steps.      Balance Overall balance assessment: Needs assistance Sitting-balance support: No upper extremity supported, Feet supported Sitting balance-Leahy Scale: Good     Standing balance support: During functional activity Standing balance-Leahy Scale: Fair                             ADL either performed or assessed with clinical judgement  ADL Overall ADL's : Needs assistance/impaired Eating/Feeding: Independent;Sitting   Grooming: Set up;Sitting   Upper Body Bathing: Set up;Sitting   Lower Body Bathing: Moderate assistance;Sit to/from stand   Upper Body Dressing : Set up;Sitting   Lower Body  Dressing: Moderate assistance;Sit to/from stand   Toilet Transfer: Minimal assistance;BSC/3in1;Rolling walker (2 wheels)   Toileting- Clothing Manipulation and Hygiene: Moderate assistance;Sit to/from stand       Functional mobility during ADLs: Min guard;Rolling walker (2 wheels) General ADL Comments: MIn guard to stand and take steps to recliner.     Vision Patient Visual Report: No change from baseline       Perception     Praxis      Pertinent Vitals/Pain Pain Assessment Pain Assessment: 0-10 Pain Score: 10-Worst pain ever Pain Location: in the back - where that knots at Pain Descriptors / Indicators: Grimacing Pain Intervention(s): Premedicated before session (received dilaudid prior to therapy evaluation)     Hand Dominance Right   Extremity/Trunk Assessment Upper Extremity Assessment Upper Extremity Assessment: Generalized weakness   Lower Extremity Assessment Lower Extremity Assessment: Defer to PT evaluation   Cervical / Trunk Assessment Cervical / Trunk Assessment: Kyphotic   Communication Communication Communication: HOH   Cognition Arousal/Alertness: Awake/alert Behavior During Therapy: WFL for tasks assessed/performed Overall Cognitive Status: Impaired/Different from baseline                                 General Comments: Patient is alert to self and place. Got the month and the year wrong. Typically is alert and oriented with prior therapy.     General Comments       Exercises     Shoulder Instructions      Home Living Family/patient expects to be discharged to:: Private residence Living Arrangements: Spouse/significant other;Children Available Help at Discharge: Family;Available 24 hours/day Type of Home: House Home Access: Ramped entrance     Home Layout: One level     Bathroom Shower/Tub: Teacher, early years/pre: Handicapped height     Home Equipment: [place] - single point;Rollator (4 wheels)    Additional Comments: daughter helps her as needed. dtr works full time - not there during the day. Her husband is there - he can walk but reports he is in worse shape than she is. wears oxygen at night      Prior Functioning/Environment Prior Level of Function : Independent/Modified Independent             Mobility Comments: reports not walking well in the last week - since the virus. typically doesn't use a device in the house. Uses a walker outside? ADLs Comments: A week ago she reported independence and still cooked - today report she hasn't been doing much        OT Problem List: Decreased strength;Decreased activity tolerance;Impaired balance (sitting and/or standing);Decreased knowledge of use of DME or AE;Decreased knowledge of precautions;Pain;Obesity      OT Treatment/Interventions: Self-care/ADL training;Therapeutic exercise;DME and/or AE instruction;Therapeutic activities;Balance training;Patient/family education    OT Goals(Current goals can be found in the care plan section) Acute Rehab OT Goals Patient Stated Goal: get stronger Time For Goal Achievement: 07/14/2022 Potential to Achieve Goals: Fair  OT Frequency: Min 2X/week    Co-evaluation              AM-PAC OT "6 Clicks" Daily Activity     Outcome Measure Help from another person eating meals?:  None Help from another person taking care of personal grooming?: A Little Help from another person toileting, which includes using toliet, bedpan, or urinal?: A Lot Help from another person bathing (including washing, rinsing, drying)?: A Little Help from another person to put on and taking off regular upper body clothing?: A Little Help from another person to put on and taking off regular lower body clothing?: A Lot 6 Click Score: 17   End of Session Equipment Utilized During Treatment: Oxygen Nurse Communication: Mobility status  Activity Tolerance: Patient tolerated treatment well Patient left: in chair;with  call bell/phone within reach;with chair alarm set  OT Visit Diagnosis: Muscle weakness (generalized) (M62.81)                Time: [id number] OT Time Calculation (min): 20 min Charges:  OT General Charges $OT Visit: 1 Visit OT Evaluation $OT Eval Low Complexity: 1 Low  Gustavo Lah, OTR/L [place]  Office [phone]   Lenward Chancellor 07/26/2022, 9:50 AM

## 2022-07-05 NOTE — Op Note (Incomplete)
PATIENT: [name]  DAY OF SURGERY: 07/05/22   PRE-OPERATIVE DIAGNOSIS:  Spinal epidural abscess   POST-OPERATIVE DIAGNOSIS:  Same   PROCEDURE:  T11 laminectomy, evacuation of epidural abscess   SURGEON:  Surgeon(s) and Role:    Judith Part, MD - Primary   ANESTHESIA: ETGA   BRIEF HISTORY: This is an 89yo woman who presented with CAP and severe back pain with some leg weakness, MRI showed an extensive large SEA, I therefore recommended laminectomy and evacuation, discussed this extensively, very high risk and possible subtotal treatment but I think a single level laminectomy is the most benefit with the least risk to the patient.    OPERATIVE DETAIL: The patient was taken to the operating room, anesthesia was induced by the anesthesia team, and the patient was placed on the OR table in the prone position. A formal time out was performed with two patient identifiers and confirmed the operative site. The operative site was marked, hair was clipped with surgical clippers, the area was then prepped and draped in a sterile fashion. A linear midline incision was placed over T11, subperiosteal dissection was performed, and a T11 laminectomy was performed. Epidural abscess was encountered, evacuated both dorsally and ventrally, cultures obtained, and thoroughly irrigated. Hemostasis was obtained and confirmed, all instrument and sponge counts were correct, and the incision was then closed in layers. The patient was then returned to anesthesia for emergence. No apparent complications at the completion of the procedure.   EBL:  ***mL   DRAINS: ***   SPECIMENS: Epidural abscess cultures x2   Judith Part, MD 07/05/22 3:14 PM

## 2022-07-05 NOTE — H&P (View-Only) (Signed)
Neurosurgery Consultation  Reason for Consult: Epidural abscess Referring Physician: Pokhrel  CC: back pain  HPI: This is a 89 y.o. woman with COPD on 4L [place] at baseline, CHF, that presents as a transfer due to epidural abscess, was admitted to [facility] for CAP. She does endorse some changes in bladder function, she's unsure about weakness as she's been globally weak due to the illness, but denies any sudden worsening in function that she is aware of.   ROS: A 14 point ROS was performed and is negative except as noted in the HPI.   PMHx:  Past Medical History:  Diagnosis Date   Cellulitis and abscess of left leg    CHF (congestive heart failure) (HCC)    COPD (chronic obstructive pulmonary disease) (HCC)    Diabetes mellitus    Glaucoma    Hyperlipidemia    Hypertension    FamHx:  Family History  Problem Relation Age of Onset   Stroke Mother    SocHx:  reports that she has never smoked. She has never used smokeless tobacco. She reports that she does not drink alcohol and does not use drugs.  Exam: Vital signs in last 24 hours: Temp:  [97.7 F (36.5 C)-97.8 F (36.6 C)] 97.8 F (36.6 C) (03/10 1412) Pulse Rate:  [90-100] 99 (03/10 1412) Resp:  [16-18] 18 (03/10 1228) BP: (103-161)/(60-80) 103/66 (03/10 1412) SpO2:  [96 %-98 %] 96 % (03/10 1412) Weight:  [83.4 kg] 83.4 kg (03/10 1419) General: Awake, alert, cooperative, lying in bed in NAD Head: Normocephalic and atruamatic HEENT: Neck supple Pulmonary: breathing supplemental O2 via [place] with baseline inc'd WOB but appears comfortable Cardiac: mildly tachy, regular  Abdomen: obese S NT ND Extremities: Warm and well perfused x4 Neuro: Very hard of hearing, Fcx4 with at least 4/5 in both upper extremities, lower extremities are closer to 3/5 to 4-/5, reflexes mute x4, no hoffman's, 2bts b/l ankle clonus   Assessment and Plan: 89 y.o. woman w/ CAP and severe back pain. MRI T/L spine personally reviewed, which shows diffuse  epidural abscess worst in the caudal thoracic spine.   -OR for emergent evacuation -had a long and very blunt conversation with the patient and her family, extraordinarily high risk but without intervention I think the abscess will not respond to Abx and likely will progress to worsening neurologic function -will d/w anesthesia but suspect she will need to be in the unit post-op  -will obtain cultures intra-operatively  Judith Part, MD 07/15/2022 3:06 PM [facility]

## 2022-07-05 NOTE — Interval H&P Note (Signed)
History and Physical Interval Note:  07/05/2022 4:08 PM  [name]  has presented today for surgery, with the diagnosis of Epidural abscess.  The various methods of treatment have been discussed with the patient and family. After consideration of risks, benefits and other options for treatment, the patient has consented to  Procedure(s): [place] (N/A) as a surgical intervention.  The patient's history has been reviewed, patient examined, no change in status, stable for surgery.  I have reviewed the patient's chart and labs.  Questions were answered to the patient's satisfaction.     Cooper Render Cristella Stiver

## 2022-07-05 NOTE — Progress Notes (Signed)
eLink Physician-Brief Progress Note Patient Name: [name] DOB: 20-Oct-1930 MRN: [id number]   Date of Service  07/05/2022  HPI/Events of Note  Patient has orders for 0.9 NaCl and LR IV infusions at 100 mL/hour. Na+ = 124.  eICU Interventions  Plan: Continue 0.9 NaCl IV infusion at 100 mL/hour.  D/C LR IV infusion.      Intervention Category Major Interventions: Other:;Electrolyte abnormality - evaluation and management  Dalayna Lauter Cornelia Copa 07/05/2022, 7:53 PM

## 2022-07-05 NOTE — Anesthesia Procedure Notes (Addendum)
Procedure Name: Intubation Date/Time: 07/05/2022 4:33 PM  Performed by: Maude Leriche, CRNAPre-anesthesia Checklist: Patient identified, Emergency Drugs available, Suction available and Patient being monitored Patient Re-evaluated:Patient Re-evaluated prior to induction Oxygen Delivery Method: Circle system utilized Preoxygenation: Pre-oxygenation with 100% oxygen Induction Type: IV induction, Rapid sequence and Cricoid Pressure applied Ventilation: Mask ventilation without difficulty Laryngoscope Size: Miller and 2 Grade View: Grade I Tube type: Oral Tube size: 7.0 mm Number of attempts: 1 Airway Equipment and Method: Stylet Placement Confirmation: ETT inserted through vocal cords under direct vision, positive ETCO2 and breath sounds checked- equal and bilateral Secured at: 21 cm Tube secured with: Tape Dental Injury: Teeth and Oropharynx as per pre-operative assessment

## 2022-07-05 NOTE — Op Note (Signed)
Date of procedure: 07/05/2022  Date of dictation: Same  Service: Neurosurgery  Preoperative diagnosis: Thoracic epidural abscess, spontaneous  Postoperative diagnosis: Same  Procedure Name: T9-T10-T11 decompressive laminectomy with evacuation of epidural abscess  Surgeon:Bannon Giammarco A.Lakeva Hollon, M.D.  Asst. Surgeon: Reinaldo Meeker, NP  Anesthesia: General  Indication: 89 year old female with recent community-acquired pneumonia and chronic lung dysfunction from COPD.  Patient with increasing back pain and bilateral lower extremity pain numbness and some weakness with question incontinence.  Workup demonstrates evidence of a large multifocal thoracic epidural abscess with marked cord compression.  Patient presents now for decompressive surgery and evacuation of abscess.  Operative note: After induction of anesthesia, patient positioned prone onto a Wilson frame and properly padded.  Thoracic region prepped and draped sterilely.  Incision made from T9-T12.  Dissection performed bilaterally.  Retractor placed.  X-ray taken.  Levels confirmed.  Decompressive laminectomy was then performed using Leksell rongeurs and Kerrison rongeurs to remove the entire lamina of T9-T10 and T11 as well as medial facetectomies at T9-10 T10-11 and T11-12.  Ligament flavum elevated and resected.  There was a large amount of dorsal epidural pus at both T9 and T11 which was evacuated.  There was a focal collection of epidural abscess laterally on the right extending into the ventral space at T11 which was dissected free and drained.  There was a large ventral lateral collection at T9 extending up to T8 which was also drained.  This provided good decompression of the spinal cord.  The wound was copiously irrigated.  Cultures were taken and sent to the lab.  Wound was irrigated 1 final time.  Medium Hemovac drain was left in the deep wound space.  Vancomycin powder placed in the deep wound space.  Wounds and closed in layers with Vicryl  sutures.  Steri-Strips and sterile dressing were applied.  No apparent complications.  Patient tolerated the procedure well and returns to the recovery room postop.

## 2022-07-05 NOTE — Progress Notes (Signed)
Pharmacy Antibiotic Note  [name] is a 89 y.o. female admitted on 06/29/2022 with surgical prophylaxis and thoracic epidural abscess .  Pharmacy has been consulted for vancomycin dosing.  Plan: Will order vancomycin 1000 mg IV every 24 hours (will start 12 hours after AET - so approximately 0630 on 3/11) Continue ceftriaxone 2g IV every 12 hours per MD Plan for ID consult on 3/11  Monitor cx results, clinical pic, and ID recs   Height: 5' 4.02" (162.6 cm) Weight: 87.9 kg (193 lb 12.6 oz) IBW/kg (Calculated) : 54.74  Temp (24hrs), Avg:98 F (36.7 C), Min:97.7 F (36.5 C), Max:98.5 F (36.9 C)  Recent Labs  Lab 06/29/22 2342 07/24/2022 2228 07/04/22 0500 07/23/2022 0525  WBC 16.9* 27.1* 25.5* 23.6*  CREATININE 1.02* 1.07* 1.00 0.86  LATICACIDVEN  --   --  1.2  --     Estimated Creatinine Clearance: 47.6 mL/min (by C-G formula based on SCr of 0.86 mg/dL).    Allergies  Allergen Reactions   Buspirone Shortness Of Breath and Other (See Comments)    "couldn't breathe"   Cephalexin Anaphylaxis, Swelling and Other (See Comments)   Micardis [Telmisartan] Other (See Comments)    Hyperkalemia during hospitalization with pulmonary edema   Atorvastatin Other (See Comments)    Myalgia    Canagliflozin Other (See Comments)    Pt does not recall reaction   Doxycycline Hyclate Nausea Only   Lipitor [Atorvastatin Calcium] Other (See Comments)    Myalgia    Tramadol Nausea Only    Antimicrobials this admission: Azithromycin 3/9 >>  Ceftriaxone 3/9 >>  Vancomycin 3/10 >>  Dose adjustments this admission: N/A  Microbiology results: 3/9 BCx: 2/4 bottles GPC in clusters (MRSA) 3/10 MRSA PCR: positive  3/10 Thoracic epidural abscess: pending  3/11 Bcx: ordered  Thank you for allowing pharmacy to be a part of this patient's care.  Antonietta Jewel, PharmD, [place] Clinical Pharmacist  Phone: [phone] 07/14/2022 8:27 PM  Please check AMION for all [place] phone  numbers After 10:00 PM, call [facility] [phone]

## 2022-07-05 NOTE — Progress Notes (Signed)
Pharmacy Antibiotic Note  [name] is a 89 y.o. female admitted on 07/12/2022 with MRSA bacteremia. Pharmacy has been consulted for vancomycin dosing.  Today, 07/21/2022: D1 vanc, D2 CAP abx SCr improved in AM Last fever 3/8 PM WBC elevated but trending down  Plan: Adjust vancomycin to 750 mg IV q24 hr (eAUC 494 w/ SCr 0.86; Vd0.5) Continue Rocephin/Azithromycin per MD for CAP  Height: '5\' 4"'$  (162.6 cm) Weight: 83.4 kg (183 lb 13.8 oz) IBW/kg (Calculated) : 54.7  Temp (24hrs), Avg:97.7 F (36.5 C), Min:97.7 F (36.5 C), Max:97.7 F (36.5 C)  Recent Labs  Lab 06/29/22 2342 06/26/2022 2228 07/04/22 0500 07/09/2022 0525  WBC 16.9* 27.1* 25.5* 23.6*  CREATININE 1.02* 1.07* 1.00 0.86  LATICACIDVEN  --   --  1.2  --      Estimated Creatinine Clearance: 46.3 mL/min (by C-G formula based on SCr of 0.86 mg/dL).    Allergies  Allergen Reactions   Buspirone Shortness Of Breath and Other (See Comments)    "couldn't breathe"   Cephalexin Anaphylaxis, Swelling and Other (See Comments)   Micardis [Telmisartan] Other (See Comments)    Hyperkalemia during hospitalization with pulmonary edema   Atorvastatin Other (See Comments)    Myalgia    Canagliflozin Other (See Comments)    Pt does not recall reaction   Doxycycline Hyclate Nausea Only   Lipitor [Atorvastatin Calcium] Other (See Comments)    Myalgia    Tramadol Nausea Only   Antimicrobials this admission: Rocephin 3/9>> Azithro 3/9>> Vanc 3/10 >>   Dose adjustments this admission: 3/10 incr vanc to 750 q24 with improved SCr  Microbiology results: 3/9 BCx: 2/2 (2/4 bottles) MRSA   Thank you for allowing pharmacy to be a part of this patient's care.  Lindy Garczynski A 06/27/2022 12:21 PM

## 2022-07-05 NOTE — Consult Note (Signed)
Full consult note to follow. Consulted by medicine service at [facility] for epidural abscess. MRI reviewed showing longitudinally extensive epidural abscess extending from at least C7 / T1 caudally through the L1-2 with multifocal mod / severe compression of the spinal cord at T8-9, T10-11, T11-12. There is also disc edema with adjacent endplate marrow edema and enhancement at T6-7, T8-9, T10-11 and T11-12 suspicious for multifocal discitis-osteomyelitis. She will need to be transferred to [facility] for a T11 laminectomy and evacuation of epidural abscess. She last took Xarelto at 12:42PM today, so she will need reversal with Andexxa prior to procedure. Spoke with medicine service to initial transfer directly to pre-op.   Norm Parcel, PA-C

## 2022-07-05 NOTE — Anesthesia Procedure Notes (Signed)
Arterial Line Insertion Start/End3/01/2023 2:50 PM, 07/05/2022 2:55 PM Performed by: Minerva Ends, CRNA, CRNA  Patient location: Pre-op. Preanesthetic checklist: patient identified, IV checked, site marked, risks and benefits discussed, surgical consent, monitors and equipment checked, pre-op evaluation, timeout performed and anesthesia consent Lidocaine 1% used for infiltration Left, radial was placed Catheter size: 20 G Hand hygiene performed  and maximum sterile barriers used  Allen's test indicative of satisfactory collateral circulation Attempts: 2 Procedure performed without using ultrasound guided technique. Following insertion, dressing applied and Biopatch. Post procedure assessment: unchanged  Patient tolerated the procedure well with no immediate complications.

## 2022-07-05 NOTE — Progress Notes (Signed)
[facility] has picked up the patient to transfer to [facility]. Report called to the pre-op RN.

## 2022-07-05 NOTE — Consult Note (Signed)
NAME:  [name], MRN:  [id number], DOB:  [birth year], LOS: 1 ADMISSION DATE:  07/09/2022, CONSULTATION DATE:  07/04/2022 REFERRING MD:  Annette Stable - NSGY , CHIEF COMPLAINT:  POD 0 T9-T10-T11decompressive lami & epidural abscess evac    History of Present Illness:  89 yo F PMH chronic hypoxia on 4L, diastolic HF, DM2 HTN, DVT on xarelto admitted to [facility] 3/9 with R chest discomfort, found to have sepsis from CAP. Start3ed on azithro rocephin. Had c/o severe back pain as well prompting imaging on  3/10 after Bcx returned + for MRSA, which revealed epidural abscess, cord compression, disc edema (detailed in MRI report)  NSGY consulted. Andexxa given. Transferred to [facility] for T9-T10-T11 lami, evac of abscess   PCCM is consulted post op     Pertinent  Medical History  COPD Chronic hypoxia Diastolic HF  DM2 DVT   Significant Hospital Events: Including procedures, antibiotic start and stop dates in addition to other pertinent events   3/9 admit to [facility]. CAP coverage. 3/10 Bcx with MRSA. MRI spine for back pain -- > NSGY consult for epidural abscess. T to [facility] for emergent T9 10 11 lami, abscess evac   Interim History / Subjective:  POD 0  Extubated post op  Complaining of back pain Objective   Blood pressure (!) 153/71, pulse 95, temperature 98.5 F (36.9 C), resp. rate 14, height 5' 4.02" (1.626 m), weight 83.4 kg, SpO2 100 %.        Intake/Output Summary (Last 24 hours) at 07/07/2022 1830 Last data filed at 07/04/2022 1814 Gross per 24 hour  Intake 1506.75 ml  Output 1200 ml  Net 306.75 ml   Filed Weights   07/04/22 1219 07/18/2022 0500 07/02/2022 1419  Weight: 83.4 kg 83.4 kg 83.4 kg    Examination: Physical exam: General: Acute on chronically ill-appearing female, lying on the bed HEENT: [place]/AT, eyes anicteric.  moist mucus membranes Neuro: Alert, awake following commands, able to move all 4 extremities within the plane of bed Chest: Coarse breath sounds, no wheezes or rhonchi Heart:  Regular rate and rhythm, no murmurs or gallops Abdomen: Soft, nontender, nondistended, bowel sounds present Skin: No rash   Resolved Hospital Problem list     Assessment & Plan:  Sepsis due to MRSA bacteremia Extensive thoracic/lumbar spinal abscess/discitis/osteomyelitis s/p T9, T10, T11 lami and abscess evac Bilateral lower lobe pneumonia Patient initially admitted with sepsis due to bilateral lower lobe pneumonia Started complaining of back pain radiating to the bilateral lower legs associated with weakness and possible bladder incontinence Blood culture grew MRSA MRI spine confirmed thoracic/lumbar spinal abscess, underwent evacuation and laminectomy Continue IV antibiotics with vancomycin Follow-up wound culture Repeat blood cultures tomorrow Will obtain echocardiogram to rule out infective endocarditis Continue gentle IV fluid hydration Neurosurgery is following Will call infectious disease in the morning as patient will require long-term antibiotic therapy Continue pain control with oxycodone and breakthrough pain with Dilaudid IV Continue Robaxin  Chronic hypoxic/hypercapnic respiratory failure on home oxygen COPD not in exacerbation Patient uses 4 L oxygen via nasal cannula Currently not in COPD exacerbation Continue bronchodilators  Chronic HFpEF Patient looks euvolemic or slightly dry Monitor intake and output  Hypertension Resume amlodipine and Imdur Hold HCTZ   Hyponatremia Could be related to HCTZ therapy Closely monitor serum sodium Continue salt tablets  DVT Patient is on Xarelto at home, received reversal agent prior to going for back surgery Will hold off anticoagulation for now Will clarify from neurosurgery when to restart  anticoagulation, if we cannot restart anticoagulation soon, she may need IVC filter  Poorly controlled diabetes with hyperglycemia Patient's hemoglobin A1c was >10 in 04/2022 Continue long acting and SSI Monitor FS with goal  140 - 180   Best Practice (right click and "Reselect all SmartList Selections" daily)   Diet/type: NPO w/ oral meds bedside swallow evaluation DVT prophylaxis: SCD GI prophylaxis: PPI Lines: Central line Foley:  N/A Code Status:  full code Last date of multidisciplinary goals of care discussion [Per primary team]  Labs   CBC: Recent Labs  Lab 06/29/22 2342 06/29/2022 2228 07/04/22 0500 07/23/2022 0525  WBC 16.9* 27.1* 25.5* 23.6*  NEUTROABS 14.4* 25.3* 23.2*  --   HGB 11.1* 11.6* 11.0* 10.7*  HCT 35.8* 35.5* 34.7* 34.0*  MCV 91.6 87.4 88.7 90.4  PLT PLATELET CLUMPS NOTED ON SMEAR, UNABLE TO ESTIMATE PLATELET CLUMPS NOTED ON SMEAR, UNABLE TO ESTIMATE PLATELET CLUMPS NOTED ON SMEAR, UNABLE TO ESTIMATE PLATELET CLUMPS NOTED ON SMEAR, UNABLE TO ESTIMATE    Basic Metabolic Panel: Recent Labs  Lab 06/29/22 2342 07/02/2022 2228 07/04/22 0500 07/25/2022 0525  NA 133* 124* 122* 124*  K 4.6 4.3 3.8 3.8  CL 96* 87* 85* 87*  CO2 '28 27 25 25  '$ GLUCOSE 376* 292* 239* 206*  BUN 39* 28* 25* 22  CREATININE 1.02* 1.07* 1.00 0.86  CALCIUM 8.7* 8.2* 8.1* 8.3*  MG 1.5* 1.3* 1.3* 1.8  PHOS  --   --  3.5  --    GFR: Estimated Creatinine Clearance: 46.3 mL/min (by C-G formula based on SCr of 0.86 mg/dL). Recent Labs  Lab 06/29/22 2342 07/04/2022 2228 07/04/22 0500 07/13/2022 0525  PROCALCITON  --  0.74  --   --   WBC 16.9* 27.1* 25.5* 23.6*  LATICACIDVEN  --   --  1.2  --     Liver Function Tests: Recent Labs  Lab 06/29/22 2342 06/30/2022 2228 07/04/22 0500  AST 18 18 14*  ALT '12 23 20  '$ ALKPHOS 72 86 87  BILITOT 0.5 1.5* 1.1  PROT 6.5 6.7 6.3*  ALBUMIN 3.1* 2.9* 2.9*   No results for input(s): "LIPASE", "AMYLASE" in the last 168 hours. No results for input(s): "AMMONIA" in the last 168 hours.  ABG    Component Value Date/Time   HCO3 30.4 (H) 06/22/2022 1411   TCO2 30 12/09/2015 0938   O2SAT 68.2 06/22/2022 1411     Coagulation Profile: No results for input(s): "INR",  "PROTIME" in the last 168 hours.  Cardiac Enzymes: No results for input(s): "CKTOTAL", "CKMB", "CKMBINDEX", "TROPONINI" in the last 168 hours.  HbA1C: Hgb A1c MFr Bld  Date/Time Value Ref Range Status  05/03/2022 10:44 AM 10.3 (H) 4.8 - 5.6 % Final    Comment:    (NOTE)         Prediabetes: 5.7 - 6.4         Diabetes: >6.4         Glycemic control for adults with diabetes: <7.0   12/07/2019 03:16 AM 6.7 (H) 4.8 - 5.6 % Final    Comment:    (NOTE) Pre diabetes:          5.7%-6.4%  Diabetes:              >6.4%  Glycemic control for   <7.0% adults with diabetes     CBG: Recent Labs  Lab 07/04/22 2037 06/26/2022 0816 07/22/2022 1206 07/23/2022 1415 07/14/2022 1730  GLUCAP 212* 205* 268* 161* 81    Review  of Systems:   12 point review of systems significant for complaint mentioned HPI, rest is negative  Past Medical History:  She,  has a past medical history of Cellulitis and abscess of left leg, CHF (congestive heart failure) ([place]), COPD (chronic obstructive pulmonary disease) ([place]), Diabetes mellitus, Glaucoma, Hyperlipidemia, and Hypertension.   Surgical History:   Past Surgical History:  Procedure Laterality Date   BACK SURGERY     CARDIAC CATHETERIZATION N/A 12/09/2015   Procedure: Right Heart Cath;  Surgeon: Adrian Prows, MD;  Location: [facility];  Service: Cardiovascular;  Laterality: N/A;   CHOLECYSTECTOMY  2000   HERNIA REPAIR  2007   rupture disk  1970"s   SHOULDER ARTHROSCOPY WITH ROTATOR CUFF REPAIR  1999   rt shoulder     Social History:   reports that she has never smoked. She has never used smokeless tobacco. She reports that she does not drink alcohol and does not use drugs.   Family History:  Her family history includes Stroke in her mother.   Allergies Allergies  Allergen Reactions   Buspirone Shortness Of Breath and Other (See Comments)    "couldn't breathe"   Cephalexin Anaphylaxis, Swelling and Other (See Comments)   Micardis  [Telmisartan] Other (See Comments)    Hyperkalemia during hospitalization with pulmonary edema   Atorvastatin Other (See Comments)    Myalgia    Canagliflozin Other (See Comments)    Pt does not recall reaction   Doxycycline Hyclate Nausea Only   Lipitor [Atorvastatin Calcium] Other (See Comments)    Myalgia    Tramadol Nausea Only     Home Medications  Prior to Admission medications   Medication Sig Start Date End Date Taking? Authorizing Provider  acetaminophen (TYLENOL) 325 MG tablet Take 2 tablets (650 mg total) by mouth every 6 (six) hours as needed for mild pain (or Fever >/= 101). Patient not taking: Reported on 06/23/2022 05/07/22   Raiford Noble Latif, DO  albuterol (VENTOLIN HFA) 108 [phone] Base) MCG/ACT inhaler Inhale 2 puffs into the lungs every 6 (six) hours as needed for wheezing or shortness of breath. 06/25/22   Nita Sells, MD  amLODipine (NORVASC) 10 MG tablet TAKE 1 TABLET(10 MG) BY MOUTH DAILY Patient taking differently: Take 10 mg by mouth daily. 05/19/22   Adrian Prows, MD  azithromycin (ZITHROMAX) 250 MG tablet Finish all meds 06/26/22   Nita Sells, MD  Brinzolamide-Brimonidine [facility]) 1-0.2 % SUSP Place 1-2 drops into both eyes daily.    [provider]  hydrochlorothiazide (HYDRODIURIL) 12.5 MG tablet Take 1 tablet (12.5 mg total) by mouth daily. 06/26/22   Nita Sells, MD  insulin aspart protamine - aspart (NOVOLOG 70/30 MIX) (70-30) 100 UNIT/ML FlexPen Inject 10 Units into the skin 2 (two) times daily with a meal. 06/25/22   Nita Sells, MD  isosorbide mononitrate (IMDUR) 60 MG 24 hr tablet TAKE 1 TABLET DAILY Patient taking differently: Take 60 mg by mouth daily. 09/15/21   Adrian Prows, MD  levalbuterol Penne Lash) 0.63 MG/3ML nebulizer solution Take 3 mLs (0.63 mg total) by nebulization every 6 (six) hours. 06/25/22   Nita Sells, MD  lidocaine (LIDODERM) 5 % Place 1 patch onto the skin daily. Remove & Discard patch  within 12 hours or as directed by MD 06/30/22   Godfrey Pick, MD  metFORMIN (GLUCOPHAGE) 1000 MG tablet Take 0.5 tablets (500 mg total) by mouth daily. 10/27/16   [name], MD  pantoprazole (PROTONIX) 40 MG tablet Take 1 tablet (40  mg total) by mouth daily. 05/08/22   Raiford Noble Latif, DO  predniSONE (DELTASONE) 20 MG tablet Take 3 tablets (60 mg total) by mouth daily before breakfast for 4 days, THEN 2 tablets (40 mg total) daily before breakfast for 4 days, THEN 1 tablet (20 mg total) daily before breakfast for 4 days. 06/26/22 07/08/22  Nita Sells, MD  rivaroxaban (XARELTO) 20 MG TABS tablet Take 1 tablet (20 mg total) by mouth daily with supper. Patient taking differently: Take 20 mg by mouth daily with lunch. 06/03/22   Charlynne Cousins, MD  ROCKLATAN 0.02-0.005 % SOLN Place 1 drop into both eyes at bedtime. 11/24/19   [provider]  TYLENOL 8 HOUR ARTHRITIS PAIN 650 MG CR tablet Take 650-1,300 mg by mouth every 8 (eight) hours as needed for pain.    [provider]  umeclidinium-vilanterol (ANORO ELLIPTA) 62.5-25 MCG/ACT AEPB Inhale 1 puff into the lungs daily. 06/26/22   Nita Sells, MD  zinc sulfate 220 (50 Zn) MG capsule Take 1 capsule (220 mg total) by mouth daily. Patient not taking: Reported on 06/23/2022 05/08/22   Kerney Elbe, DO     Critical care time:     This patient is critically ill with multiple organ system failure which requires frequent high complexity decision making, assessment, support, evaluation, and titration of therapies. This was completed through the application of advanced monitoring technologies and extensive interpretation of multiple databases.  During this encounter critical care time was devoted to patient care services described in this note for 36 minutes.    Jacky Kindle, MD [employer] [facility] Critical Care See Amion for pager If no response to pager, please call [phone] until 7pm After 7pm, Please call [facility]  [phone]

## 2022-07-05 NOTE — Transfer of Care (Signed)
Immediate Anesthesia Transfer of Care Note  Patient: [name]  Procedure(s) Performed: THORACIC NINE, THORACIC TEN, THORACIC ELEVEN, LAMINECTOMY FOR EPIDURAL ABSCESS (Back)  Patient Location: PACU  Anesthesia Type:General  Level of Consciousness: awake, alert , and oriented  Airway & Oxygen Therapy: Patient Spontanous Breathing and Patient connected to face mask oxygen  Post-op Assessment: Report given to RN, Post -op Vital signs reviewed and stable, Patient moving all extremities X 4, and Patient able to stick tongue midline  Post vital signs: Reviewed  Last Vitals:  Vitals Value Taken Time  BP 153/71 07/05/22 1821  Temp 36.9 C 07/05/22 1815  Pulse 99 07/05/22 1822  Resp 18 07/05/22 1822  SpO2 98 % 07/05/22 1822  Vitals shown include unvalidated device data.  Last Pain:  Vitals:   07/05/22 1412  TempSrc: Oral  PainSc:       Patients Stated Pain Goal: 2 ([id number])  Complications: No notable events documented.

## 2022-07-05 NOTE — Progress Notes (Signed)
Pharmacy Antibiotic Note  [name] is a 89 y.o. female admitted on 07/22/2022 with bacteremia.  Pharmacy has been consulted for vancomycin dosing.  Plan: Vanc '1500mg'$  IV x 1 then 1g IV q36 - goal AUC 400-550  Height: '5\' 4"'$  (162.6 cm) Weight: 83.4 kg (183 lb 13.8 oz) IBW/kg (Calculated) : 54.7  Temp (24hrs), Avg:97.7 F (36.5 C), Min:97.6 F (36.4 C), Max:97.7 F (36.5 C)  Recent Labs  Lab 06/29/22 2342 07/25/2022 2228 07/04/22 0500  WBC 16.9* 27.1* 25.5*  CREATININE 1.02* 1.07* 1.00  LATICACIDVEN  --   --  1.2    Estimated Creatinine Clearance: 39.9 mL/min (by C-G formula based on SCr of 1 mg/dL).    Allergies  Allergen Reactions   Buspirone Shortness Of Breath and Other (See Comments)    "couldn't breathe"   Cephalexin Anaphylaxis, Swelling and Other (See Comments)   Micardis [Telmisartan] Other (See Comments)    Hyperkalemia during hospitalization with pulmonary edema   Atorvastatin Other (See Comments)    Myalgia    Canagliflozin Other (See Comments)    Pt does not recall reaction   Doxycycline Hyclate Nausea Only   Lipitor [Atorvastatin Calcium] Other (See Comments)    Myalgia    Tramadol Nausea Only     Thank you for allowing pharmacy to be a part of this patient's care.  Kara Mead 06/29/2022 7:37 AM

## 2022-07-05 NOTE — Progress Notes (Addendum)
PROGRESS NOTE  [name]  [id number] DOB: January 06, 1931 DOA: 07/21/2022 PCP: [name], DO   Brief Narrative: Patient is a 89 year old female with history of chronic respiratory failure on 4 L of oxygen secondary to COPD, diabetes type 2, chronic diastolic CHF, hypertension, DVT on Xarelto who presented here from nursing facility after see complaint of right lower chest discomfort radiating to the right upper quadrant, nonproductive cough, subjective fever.  She was recently admitted here in February when she was managed for acute COPD exacerbation and was discharged on steroid taper.On presentation, she was mildly febrile, saturating fine on 4 L of oxygen.  Lab work showed sodium of 127, WBC count of 27.  COVID/RSV, influenza negative.  CT abdomen/pelvis showed no evidence of acute intra-abdominal or intrapelvic process but showed bilateral airspace opacities consistent with infiltrates/pneumonia.  Patient was admitted for the management of community-acquired pneumonia, started on antibiotics.  She was noted to have intermittent back pain.  Hospital course remarkable for finding of MRSA bacteremia in one of the cultures, ID consulted, plan for MRI of the back   Assessment & Plan:  Sepsis secondary to community-acquired pneumonia/ R/O discitis, MRSA bacteremia: Presented with new onset cough, back pain.  CT abdomen/pelvis showed bibasilar infiltrates.  Also had fever, tachycardia, tachypnea presentation with leukocytosis.  Blood culture ,one set showing MRSA.  Currently on vancomycin, ceftriaxone,azithro.  ID consulted.  Severe back pain: Complaining of severe back pain .  Pain better with pain medications.  Ordering thoracic/lumbar MRI to rule out discitis/osteomyelitis.  Continue pain medicine/Supportive care.  Physical therapy when appropriate.     Leukocytosis: She was recently admitted here and was managed for COPD and was discharged on tapering steroids.  Cud  also from concurrent  infectious process.  Continue monitoring   Constipation/abdominal distention: As per the daughter, she does not have a bowel movement for 1 week.   Her abdomen was  distended on admission.  CT abdomen did not show  any obstruction but showed moderate constipation.  Given enema with bowel movement on 3/9.  Continue aggressive bowel regimen with MiraLAX and Senokot   Hyponatremia: Picture suggest SIADH.  Sodium low at 122.  BNP mild elevated.  Urine sodium of 75 Continue fluid restriction.  Will start on salt tablets.  Patient was also taking hydrochlorothiazide ,held   Diabetes type 2: Most recent A1c of 10.3.  Continue current insulin regimen.  Monitor blood sugars   Chronic hypoxia failure/COPD: On 4 L of oxygen currently.  Currently not in exacerbation.  Continue bronchodilators   Chronic diastolic CHF: Last echo showed EF of 55 to [id number], grade 2 diastolic dysfunction.  Currently looks overall euvolemic.  Home hydrochlorothiazide on hold.   Hypertension: Currently above stable.  Takes amlodipine, Imdur,HCTZ at home.  Restarted amlodipine, Imdur   History of DVT: Continue Xarelto   Severe hypomagnesemia: Supplemented and corrected.  Anemia of chronic disease: Currently hemoglobin around baseline   Debility/deconditioning: Patient is currently lives with daughter . Ambulates with walker/wheelchair.PT/OT consulted    Addendum: Radiology informed me that the MRI of the thoracic/lumbar spine showed extensive epidural abscess with multifocal moderate/severe compression of the spinal cord.  Stat neurosurgery consult obtained.  She is being planned for T11 laminectomy and evacuation of epidural abscess.  Patient being transferred to [facility]. Updated daughter Scottie again about above results       DVT prophylaxis:SCDs Start: 07/04/22 0215 rivaroxaban (XARELTO) tablet 20 mg     Code Status: Full Code  Family Communication:  Daughter on phone on 3/10  Patient status:Inpatient  Patient is  from :Home  Anticipated discharge [id number]  Estimated DC date: After full workup of bacteremia, resolution of back pain   Consultants: ID  Procedures: None  Antimicrobials:  Anti-infectives (From admission, onward)    Start     Dose/Rate Route Frequency Ordered Stop   07/06/22 2200  vancomycin (VANCOCIN) IVPB 1000 mg/200 mL premix        1,000 mg 200 mL/hr over 60 Minutes Intravenous Every 36 hours 07/04/2022 0738     06/27/2022 0900  vancomycin (VANCOREADY) IVPB 1500 mg/300 mL        1,500 mg 150 mL/hr over 120 Minutes Intravenous  Once 07/11/2022 0738     07/11/2022 0300  cefTRIAXone (ROCEPHIN) 2 g in sodium chloride 0.9 % 100 mL IVPB        2 g 200 mL/hr over 30 Minutes Intravenous Every 24 hours 07/04/22 0821     07/04/22 1800  cefTRIAXone (ROCEPHIN) 1 g in sodium chloride 0.9 % 100 mL IVPB  Status:  Discontinued        1 g 200 mL/hr over 30 Minutes Intravenous Every 24 hours 07/04/22 0216 07/04/22 0821   07/04/22 1800  azithromycin (ZITHROMAX) 500 mg in sodium chloride 0.9 % 250 mL IVPB        500 mg 250 mL/hr over 60 Minutes Intravenous Every 24 hours 07/04/22 0216     07/04/22 0145  cefTRIAXone (ROCEPHIN) 2 g in sodium chloride 0.9 % 100 mL IVPB        2 g 200 mL/hr over 30 Minutes Intravenous  Once 07/04/22 0136 07/04/22 0247   07/04/22 0145  azithromycin (ZITHROMAX) 500 mg in sodium chloride 0.9 % 250 mL IVPB        500 mg 250 mL/hr over 60 Minutes Intravenous  Once 07/04/22 0136 07/04/22 0544       Subjective: Patient seen and examined at bedside today.  She again started having some mild to moderate back pain today.  Had a bowel movement after given enema yesterday afternoon.  On 4 L of oxygen.  Sitting on the chair.  Denies shortness of breath  Objective: Vitals:   07/04/22 2020 07/04/22 2046 06/30/2022 0500 07/18/2022 0512  BP: (!) 158/80   (!) 155/78  Pulse: 90   100  Resp: 18   17  Temp: 97.7 F (36.5 C)   97.7 F (36.5 C)  TempSrc: Oral   Oral  SpO2: 96%   97%   Weight:   83.4 kg   Height:  '5\' 4"'$  (1.626 m)      Intake/Output Summary (Last 24 hours) at 07/18/2022 1057 Last data filed at 06/29/2022 0413 Gross per 24 hour  Intake 950.03 ml  Output 950 ml  Net 0.03 ml   Filed Weights   07/04/22 1219 07/01/2022 0500  Weight: 83.4 kg 83.4 kg    Examination:  General exam: Overall comfortable, not in distress, sitting in the chair, elderly, deconditioned female HEENT: PERRL Respiratory system:  no wheezes or crackles , diminished sounds on bases Cardiovascular system: S1 & S2 heard, RRR.  Gastrointestinal system: Abdomen is mildly distended, soft and nontender.  Ventral hernia Central nervous system: Alert and oriented Extremities: No edema, no clubbing ,no cyanosis Skin: No rashes, no ulcers,no icterus     Data Reviewed: I have personally reviewed following labs and imaging studies  CBC: Recent Labs  Lab 06/29/22 2342 07/17/2022 2228 07/04/22 0500 07/17/2022 0525  WBC 16.9*  27.1* 25.5* 23.6*  NEUTROABS 14.4* 25.3* 23.2*  --   HGB 11.1* 11.6* 11.0* 10.7*  HCT 35.8* 35.5* 34.7* 34.0*  MCV 91.6 87.4 88.7 90.4  PLT PLATELET CLUMPS NOTED ON SMEAR, UNABLE TO ESTIMATE PLATELET CLUMPS NOTED ON SMEAR, UNABLE TO ESTIMATE PLATELET CLUMPS NOTED ON SMEAR, UNABLE TO ESTIMATE PLATELET CLUMPS NOTED ON SMEAR, UNABLE TO ESTIMATE   Basic Metabolic Panel: Recent Labs  Lab 06/29/22 2342 07/23/2022 2228 07/04/22 0500 06/30/2022 0525  NA 133* 124* 122* 124*  K 4.6 4.3 3.8 3.8  CL 96* 87* 85* 87*  CO2 '28 27 25 25  '$ GLUCOSE 376* 292* 239* 206*  BUN 39* 28* 25* 22  CREATININE 1.02* 1.07* 1.00 0.86  CALCIUM 8.7* 8.2* 8.1* 8.3*  MG 1.5* 1.3* 1.3* 1.8  PHOS  --   --  3.5  --      Recent Results (from the past 240 hour(s))  Resp panel by RT-PCR (RSV, Flu A&B, Covid)     Status: None   Collection Time: 07/04/22  1:23 AM  Result Value Ref Range Status   SARS Coronavirus 2 by RT PCR NEGATIVE NEGATIVE Final    Comment: (NOTE) SARS-CoV-2 target nucleic  acids are NOT DETECTED.  The SARS-CoV-2 RNA is generally detectable in upper respiratory specimens during the acute phase of infection. The lowest concentration of SARS-CoV-2 viral copies this assay can detect is 138 copies/mL. A negative result does not preclude SARS-Cov-2 infection and should not be used as the sole basis for treatment or other patient management decisions. A negative result may occur with  improper specimen collection/handling, submission of specimen other than nasopharyngeal swab, presence of viral mutation(s) within the areas targeted by this assay, and inadequate number of viral copies(<138 copies/mL). A negative result must be combined with clinical observations, patient history, and epidemiological information. The expected result is Negative.  Fact Sheet for Patients:  EntrepreneurPulse.com.au  Fact Sheet for Healthcare Providers:  IncredibleEmployment.be  This test is no t yet approved or cleared by the [place] FDA and  has been authorized for detection and/or diagnosis of SARS-CoV-2 by FDA under an Emergency Use Authorization (EUA). This EUA will remain  in effect (meaning this test can be used) for the duration of the COVID-19 declaration under Section 564(b)(1) of the Act, 21 U.S.C.section 360bbb-3(b)(1), unless the authorization is terminated  or revoked sooner.       Influenza A by PCR NEGATIVE NEGATIVE Final   Influenza B by PCR NEGATIVE NEGATIVE Final    Comment: (NOTE) The Xpert Xpress SARS-CoV-2/FLU/RSV plus assay is intended as an aid in the diagnosis of influenza from Nasopharyngeal swab specimens and should not be used as a sole basis for treatment. Nasal washings and aspirates are unacceptable for Xpert Xpress SARS-CoV-2/FLU/RSV testing.  Fact Sheet for Patients: EntrepreneurPulse.com.au  Fact Sheet for Healthcare Providers: IncredibleEmployment.be  This  test is not yet approved or cleared by the [place] FDA and has been authorized for detection and/or diagnosis of SARS-CoV-2 by FDA under an Emergency Use Authorization (EUA). This EUA will remain in effect (meaning this test can be used) for the duration of the COVID-19 declaration under Section 564(b)(1) of the Act, 21 U.S.C. section 360bbb-3(b)(1), unless the authorization is terminated or revoked.     Resp Syncytial Virus by PCR NEGATIVE NEGATIVE Final    Comment: (NOTE) Fact Sheet for Patients: EntrepreneurPulse.com.au  Fact Sheet for Healthcare Providers: IncredibleEmployment.be  This test is not yet approved or cleared by the [place] FDA  and has been authorized for detection and/or diagnosis of SARS-CoV-2 by FDA under an Emergency Use Authorization (EUA). This EUA will remain in effect (meaning this test can be used) for the duration of the COVID-19 declaration under Section 564(b)(1) of the Act, 21 U.S.C. section 360bbb-3(b)(1), unless the authorization is terminated or revoked.  Performed at [facility], [place] [address] [place], [place] [address]   Blood culture (routine x 2)     Status: None (Preliminary result)   Collection Time: 07/04/22  5:22 AM   Specimen: BLOOD  Result Value Ref Range Status   Specimen Description   Final    BLOOD LEFT ANTECUBITAL Performed at [facility] [address], [place], [place] [address]    Special Requests   Final    BOTTLES DRAWN AEROBIC AND ANAEROBIC Blood Culture results may not be optimal due to an excessive volume of blood received in culture bottles Performed at [facility] [address] [place], [place] [address]    Culture  Setup Time   Final    GRAM POSITIVE COCCI IN CLUSTERS ANAEROBIC BOTTLE ONLY CRITICAL VALUE NOTED.  VALUE IS CONSISTENT WITH PREVIOUSLY REPORTED AND CALLED VALUE. Performed at [facility], [place] [address], [place], [place] [address]    Culture GRAM POSITIVE COCCI  Final   Report Status PENDING  Incomplete  Blood culture (routine x 2)     Status: None (Preliminary result)   Collection Time: 07/04/22  6:56 AM   Specimen: BLOOD RIGHT HAND  Result Value Ref Range Status   Specimen Description   Final    BLOOD RIGHT HAND Performed at [facility], [place] [address] [place], [place] [address]    Special Requests   Final    BOTTLES DRAWN AEROBIC AND ANAEROBIC Blood Culture results may not be optimal due to an excessive volume of blood received in culture bottles Performed at [facility] [address], [place], [place] [address]    Culture  Setup Time   Final    GRAM POSITIVE COCCI IN CLUSTERS ANAEROBIC BOTTLE ONLY CRITICAL RESULT CALLED TO, READ BACK BY AND VERIFIED WITH: PHARMD J. LEGGE 07/25/2022 @ 0728 BY AB Performed at [facility], [place] [address] [place], [place] [address]    Culture GRAM POSITIVE COCCI  Final   Report Status PENDING  Incomplete  Blood Culture ID Panel (Reflexed)     Status: Abnormal   Collection Time: 07/04/22  6:56 AM  Result Value Ref Range Status   Enterococcus faecalis NOT DETECTED NOT DETECTED Final   Enterococcus Faecium NOT DETECTED NOT DETECTED Final   Listeria monocytogenes NOT DETECTED NOT DETECTED Final   Staphylococcus species DETECTED (A) NOT DETECTED Final    Comment: CRITICAL RESULT CALLED TO, READ BACK BY AND VERIFIED WITH: PHARMD J. LEGGE 07/22/2022 @ 0728 BY AB    Staphylococcus aureus (BCID) DETECTED (A) NOT DETECTED Final    Comment: Methicillin (oxacillin)-resistant Staphylococcus aureus (MRSA). MRSA is predictably resistant to beta-lactam antibiotics (except ceftaroline). Preferred therapy is vancomycin unless clinically contraindicated. Patient requires contact precautions if  hospitalized. CRITICAL RESULT CALLED TO, READ BACK BY AND VERIFIED WITH: PHARMD J. LEGGE 07/14/2022 @ 0728 BY AB     Staphylococcus epidermidis NOT DETECTED NOT DETECTED Final   Staphylococcus lugdunensis NOT DETECTED NOT DETECTED Final   Streptococcus species NOT DETECTED NOT DETECTED Final   Streptococcus agalactiae NOT DETECTED NOT DETECTED Final   Streptococcus pneumoniae  NOT DETECTED NOT DETECTED Final   Streptococcus pyogenes NOT DETECTED NOT DETECTED Final   A.calcoaceticus-baumannii NOT DETECTED NOT DETECTED Final   Bacteroides fragilis NOT DETECTED NOT DETECTED Final   Enterobacterales NOT DETECTED NOT DETECTED Final   Enterobacter cloacae complex NOT DETECTED NOT DETECTED Final   Escherichia coli NOT DETECTED NOT DETECTED Final   Klebsiella aerogenes NOT DETECTED NOT DETECTED Final   Klebsiella oxytoca NOT DETECTED NOT DETECTED Final   Klebsiella pneumoniae NOT DETECTED NOT DETECTED Final   Proteus species NOT DETECTED NOT DETECTED Final   Salmonella species NOT DETECTED NOT DETECTED Final   Serratia marcescens NOT DETECTED NOT DETECTED Final   Haemophilus influenzae NOT DETECTED NOT DETECTED Final   Neisseria meningitidis NOT DETECTED NOT DETECTED Final   Pseudomonas aeruginosa NOT DETECTED NOT DETECTED Final   Stenotrophomonas maltophilia NOT DETECTED NOT DETECTED Final   Candida albicans NOT DETECTED NOT DETECTED Final   Candida auris NOT DETECTED NOT DETECTED Final   Candida glabrata NOT DETECTED NOT DETECTED Final   Candida krusei NOT DETECTED NOT DETECTED Final   Candida parapsilosis NOT DETECTED NOT DETECTED Final   Candida tropicalis NOT DETECTED NOT DETECTED Final   Cryptococcus neoformans/gattii NOT DETECTED NOT DETECTED Final   Meth resistant mecA/C and MREJ DETECTED (A) NOT DETECTED Final    Comment: CRITICAL RESULT CALLED TO, READ BACK BY AND VERIFIED WITH: PHARMD J. LEGGE 07/25/2022 @ 0728 BY AB Performed at [facility], [address], [place], [place] [address]      Radiology Studies: DG Chest Port 1 View  Result Date: 07/04/2022 CLINICAL DATA:  Constipation and  cough EXAM: PORTABLE CHEST 1 VIEW COMPARISON:  CTA chest 06/30/2022 FINDINGS: Stable cardiomegaly. Aortic atherosclerotic calcification. Pulmonary vascular congestion. Unchanged mild left basilar airspace opacities. No definite pleural effusion. No pneumothorax. No acute osseous abnormality. Postoperative changes right humeral head. IMPRESSION: 1. Cardiomegaly with pulmonary vascular congestion. 2. Unchanged mild left basilar atelectasis/scarring. Electronically Signed   By: Placido Sou M.D.   On: 07/04/2022 03:28   CT ABDOMEN PELVIS W CONTRAST  Result Date: 07/04/2022 CLINICAL DATA:  Abdominal pain. EXAM: CT ABDOMEN AND PELVIS WITH CONTRAST TECHNIQUE: Multidetector CT imaging of the abdomen and pelvis was performed using the standard protocol following bolus administration of intravenous contrast. RADIATION DOSE REDUCTION: This exam was performed according to the departmental dose-optimization program which includes automated exposure control, adjustment of the mA and/or kV according to patient size and/or use of iterative reconstruction technique. CONTRAST:  168m OMNIPAQUE IOHEXOL 300 MG/ML  SOLN COMPARISON:  CT abdomen pelvis dated 11/14/2010. FINDINGS: Lower chest: Bibasilar subpleural streaky and reticular atelectasis or infiltrate. Aspiration is not excluded clinical correlation is recommended. There is coronary vascular calcification. No intra-abdominal free air or free fluid. Hepatobiliary: The liver is unremarkable. There is mild biliary dilatation, post cholecystectomy. No retained calcified stone noted in the central CBD. Pancreas: Unremarkable. No pancreatic ductal dilatation or surrounding inflammatory changes. Spleen: There is a 3.5 cm cyst in the spleen. The spleen is otherwise unremarkable. Adrenals/Urinary Tract: There adrenal glands are unremarkable. There is a 3 cm left renal inferior pole cyst. There is no hydronephrosis on either side. There is symmetric enhancement and excretion of  contrast by both kidneys. The visualized ureters and urinary bladder appear unremarkable. Stomach/Bowel: There is sigmoid diverticulosis without active inflammatory changes. There is moderate stool throughout the colon. There is no bowel obstruction or active inflammation. The appendix is normal. Vascular/Lymphatic: Mild aortoiliac atherosclerotic disease. The IVC is unremarkable.  No portal venous gas. No adenopathy. Reproductive: The uterus is grossly unremarkable. No adnexal masses. Other: Ventral hernia repair mesh. Musculoskeletal: Osteopenia with degenerative changes of the spine. No acute osseous pathology. IMPRESSION: 1. No acute intra-abdominal or pelvic pathology. 2. Sigmoid diverticulosis. No bowel obstruction. Normal appendix. 3. Bibasilar subpleural streaky and reticular atelectasis or infiltrate. Aspiration is not excluded clinical correlation is recommended. 4.  Aortic Atherosclerosis (ICD10-I70.0). Electronically Signed   By: Anner Crete M.D.   On: 07/04/2022 01:17   DG Abdomen 1 View  Result Date: 07/20/2022 CLINICAL DATA:  constipation EXAM: ABDOMEN - 1 VIEW COMPARISON:  CT angio chest 06/30/2022, CT abdomen pelvis 11/14/2010 FINDINGS: Limited evaluation due to overlapping osseous structures and overlying soft tissues. Stool throughout the ascending colon. The bowel gas pattern is normal. Calcified splenic artery within left upper quadrant again noted. No radio-opaque calculi or other significant radiographic abnormality are seen. Surgical tacks related to a ventral wall hernia repair noted overlying the abdomen and pelvis. IMPRESSION: Nonobstructive bowel gas pattern. Electronically Signed   By: Iven Finn M.D.   On: 07/15/2022 23:09    Scheduled Meds:  insulin aspart  0-15 Units Subcutaneous TID WC   insulin glargine-yfgn  8 Units Subcutaneous BID   pantoprazole  40 mg Oral Daily   polyethylene glycol  17 g Oral Daily   rivaroxaban  20 mg Oral Q lunch   senna-docusate  1  tablet Oral BID   sodium chloride  2 g Oral BID WC   umeclidinium-vilanterol  1 puff Inhalation Daily   Continuous Infusions:  azithromycin Stopped (07/24/2022 0413)   cefTRIAXone (ROCEPHIN)  IV Stopped (07/24/2022 0413)   [START ON 07/06/2022] vancomycin     vancomycin 1,500 mg (07/13/2022 0902)     LOS: 1 day   Shelly Coss, MD [facility] P3/01/2023, 10:57 AM

## 2022-07-06 ENCOUNTER — Inpatient Hospital Stay (HOSPITAL_COMMUNITY): Payer: Medicare Other

## 2022-07-06 DIAGNOSIS — R7881 Bacteremia: Secondary | ICD-10-CM

## 2022-07-06 DIAGNOSIS — G061 Intraspinal abscess and granuloma: Secondary | ICD-10-CM

## 2022-07-06 DIAGNOSIS — M4620 Osteomyelitis of vertebra, site unspecified: Secondary | ICD-10-CM | POA: Diagnosis not present

## 2022-07-06 DIAGNOSIS — B9562 Methicillin resistant Staphylococcus aureus infection as the cause of diseases classified elsewhere: Secondary | ICD-10-CM | POA: Insufficient documentation

## 2022-07-06 DIAGNOSIS — G062 Extradural and subdural abscess, unspecified: Secondary | ICD-10-CM | POA: Diagnosis not present

## 2022-07-06 DIAGNOSIS — E871 Hypo-osmolality and hyponatremia: Secondary | ICD-10-CM | POA: Diagnosis not present

## 2022-07-06 DIAGNOSIS — J189 Pneumonia, unspecified organism: Secondary | ICD-10-CM | POA: Diagnosis not present

## 2022-07-06 LAB — CBC WITH DIFFERENTIAL/PLATELET
Abs Immature Granulocytes: 0.44 10*3/uL — ABNORMAL HIGH (ref 0.00–0.07)
Basophils Absolute: 0 10*3/uL (ref 0.0–0.1)
Basophils Relative: 0 %
Eosinophils Absolute: 0.3 10*3/uL (ref 0.0–0.5)
Eosinophils Relative: 1 %
HCT: 29.9 % — ABNORMAL LOW (ref 36.0–46.0)
Hemoglobin: 9.7 g/dL — ABNORMAL LOW (ref 12.0–15.0)
Immature Granulocytes: 2 %
Lymphocytes Relative: 5 %
Lymphs Abs: 1.1 10*3/uL (ref 0.7–4.0)
MCH: 29.3 pg (ref 26.0–34.0)
MCHC: 32.4 g/dL (ref 30.0–36.0)
MCV: 90.3 fL (ref 80.0–100.0)
Monocytes Absolute: 0.9 10*3/uL (ref 0.1–1.0)
Monocytes Relative: 4 %
Neutro Abs: 18.9 10*3/uL — ABNORMAL HIGH (ref 1.7–7.7)
Neutrophils Relative %: 88 %
Platelets: UNDETERMINED 10*3/uL (ref 150–400)
RBC: 3.31 MIL/uL — ABNORMAL LOW (ref 3.87–5.11)
RDW: 15.3 % (ref 11.5–15.5)
WBC: 21.6 10*3/uL — ABNORMAL HIGH (ref 4.0–10.5)
nRBC: 0 % (ref 0.0–0.2)

## 2022-07-06 LAB — COMPREHENSIVE METABOLIC PANEL
ALT: 24 U/L (ref 0–44)
AST: 24 U/L (ref 15–41)
Albumin: 2 g/dL — ABNORMAL LOW (ref 3.5–5.0)
Alkaline Phosphatase: 144 U/L — ABNORMAL HIGH (ref 38–126)
Anion gap: 17 — ABNORMAL HIGH (ref 5–15)
BUN: 19 mg/dL (ref 8–23)
CO2: 23 mmol/L (ref 22–32)
Calcium: 8.4 mg/dL — ABNORMAL LOW (ref 8.9–10.3)
Chloride: 90 mmol/L — ABNORMAL LOW (ref 98–111)
Creatinine, Ser: 0.94 mg/dL (ref 0.44–1.00)
GFR, Estimated: 58 mL/min — ABNORMAL LOW (ref >60)
Glucose, Bld: 91 mg/dL (ref 70–99)
Potassium: 3.8 mmol/L (ref 3.5–5.1)
Sodium: 130 mmol/L — ABNORMAL LOW (ref 135–145)
Total Bilirubin: 0.6 mg/dL (ref 0.3–1.2)
Total Protein: 5.6 g/dL — ABNORMAL LOW (ref 6.5–8.1)

## 2022-07-06 LAB — ECHOCARDIOGRAM COMPLETE
AR max vel: 1.15 cm2
AV Area VTI: 1.43 cm2
AV Area mean vel: 1.19 cm2
AV Mean grad: 17 mmHg
AV Peak grad: 29.8 mmHg
Ao pk vel: 2.73 m/s
Area-P 1/2: 3.37 cm2
Calc EF: 87.5 %
Height: 64.016 in
S' Lateral: 1.8 cm
Single Plane A2C EF: 84 %
Single Plane A4C EF: 89.5 %
Weight: 3167.57 oz

## 2022-07-06 LAB — GLUCOSE, CAPILLARY
Glucose-Capillary: 119 mg/dL — ABNORMAL HIGH (ref 70–99)
Glucose-Capillary: 173 mg/dL — ABNORMAL HIGH (ref 70–99)
Glucose-Capillary: 130 mg/dL — ABNORMAL HIGH (ref 70–99)
Glucose-Capillary: 100 mg/dL — ABNORMAL HIGH (ref 70–99)

## 2022-07-06 LAB — MAGNESIUM: Magnesium: 1.7 mg/dL (ref 1.7–2.4)

## 2022-07-06 LAB — PHOSPHORUS: Phosphorus: 3.5 mg/dL (ref 2.5–4.6)

## 2022-07-06 MED ORDER — HYDRALAZINE HCL 20 MG/ML IJ SOLN
10.0000 mg | INTRAMUSCULAR | Status: DC | PRN
  Administered 2022-07-06 (×2): 10 mg via INTRAVENOUS
  Filled 2022-07-06 (×2): qty 1

## 2022-07-06 MED ORDER — POTASSIUM CHLORIDE CRYS ER 20 MEQ PO TBCR
40.0000 meq | EXTENDED_RELEASE_TABLET | Freq: Once | ORAL | Status: AC
  Administered 2022-07-06: 40 meq via ORAL
  Filled 2022-07-06: qty 2

## 2022-07-06 MED ORDER — INSULIN ASPART 100 UNIT/ML IJ SOLN
0.0000 [IU] | Freq: Three times a day (TID) | INTRAMUSCULAR | Status: DC
  Administered 2022-07-06: 1 [IU] via SUBCUTANEOUS
  Administered 2022-07-06 – 2022-07-08 (×4): 2 [IU] via SUBCUTANEOUS

## 2022-07-06 MED ORDER — METOPROLOL TARTRATE 5 MG/5ML IV SOLN
5.0000 mg | Freq: Four times a day (QID) | INTRAVENOUS | Status: DC | PRN
  Administered 2022-07-06 – 2022-07-08 (×5): 5 mg via INTRAVENOUS
  Filled 2022-07-06 (×6): qty 5

## 2022-07-06 MED ORDER — PERFLUTREN LIPID MICROSPHERE
1.0000 mL | INTRAVENOUS | Status: AC | PRN
  Administered 2022-07-06: 2 mL via INTRAVENOUS

## 2022-07-06 MED ORDER — INSULIN ASPART 100 UNIT/ML IJ SOLN: 3.0000 [IU] | Freq: Three times a day (TID) | INTRAMUSCULAR | Status: DC

## 2022-07-06 MED ORDER — HALOPERIDOL LACTATE 5 MG/ML IJ SOLN
2.0000 mg | Freq: Four times a day (QID) | INTRAMUSCULAR | Status: DC | PRN
  Administered 2022-07-07: 2 mg via INTRAVENOUS
  Filled 2022-07-06 (×2): qty 1

## 2022-07-06 MED ORDER — INSULIN ASPART 100 UNIT/ML IJ SOLN: 0.0000 [IU] | Freq: Every day | INTRAMUSCULAR | Status: DC

## 2022-07-06 MED ORDER — POTASSIUM CHLORIDE CRYS ER 20 MEQ PO TBCR: 20.0000 meq | EXTENDED_RELEASE_TABLET | Freq: Once | ORAL | Status: DC

## 2022-07-06 MED ORDER — METOPROLOL TARTRATE 5 MG/5ML IV SOLN
2.5000 mg | Freq: Once | INTRAVENOUS | Status: AC
  Administered 2022-07-06: 2.5 mg via INTRAVENOUS
  Filled 2022-07-06: qty 5

## 2022-07-06 MED ORDER — FUROSEMIDE 10 MG/ML IJ SOLN: 20.0000 mg | Freq: Once | INTRAMUSCULAR | Status: DC

## 2022-07-06 MED ORDER — MAGNESIUM SULFATE 2 GM/50ML IV SOLN
2.0000 g | Freq: Once | INTRAVENOUS | Status: AC
  Administered 2022-07-06: 2 g via INTRAVENOUS
  Filled 2022-07-06: qty 50

## 2022-07-06 MED ORDER — POLYETHYLENE GLYCOL 3350 17 G PO PACK
17.0000 g | PACK | Freq: Two times a day (BID) | ORAL | Status: AC
  Administered 2022-07-06 – 2022-07-07 (×2): 17 g via ORAL
  Filled 2022-07-06 (×2): qty 1

## 2022-07-06 MED ORDER — SODIUM CHLORIDE 0.9 % IV SOLN: INTRAVENOUS | Status: DC

## 2022-07-06 NOTE — NC FL2 (Signed)
[place] LEVEL OF CARE FORM     IDENTIFICATION  Patient Name: [name] Birthdate: [birth year] Sex: female Admission Date (Current Location): 07/13/2022  [facility] and [place] Number:  Herbalist and Address:  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 07/20/2022   Anemia of chronic disease 07/04/2022   COPD with acute exacerbation ([place]) 06/22/2022   Rectal bleeding 05/17/2022   UTI (urinary tract infection) 05/16/2022   Pressure injury of skin 05/16/2022   Acute bronchitis 05/15/2022   Elevated lactic acid level 05/14/2022   Hyperkalemia 05/14/2022   Hyperosmolar hyperglycemic state (HHS) ([place]) 05/13/2022   History of DVT (deep vein thrombosis) 05/13/2022   COVID-19 virus infection 05/04/2022   COPD exacerbation ([place]) 05/04/2022   AKI (acute kidney injury) ([place]) 05/03/2022   Acute hyponatremia 05/03/2022   Elevated LFTs 05/03/2022   Chronic hypoxic respiratory failure ([place]) 05/03/2022   Anxiety 12/18/2020   Arthritis 12/18/2020   Aortic valve disorder 12/18/2020   COPD (chronic obstructive pulmonary disease) ([place]) 12/18/2020   Diabetic neuropathy ([place]) 12/18/2020   Diabetic renal disease ([place]) 12/18/2020   Heart failure ([place]) 12/18/2020   Hyperlipidemia 12/18/2020   Mitral regurgitation 12/18/2020   Morbid obesity ([place]) 12/18/2020   Obstructive sleep apnea syndrome  12/18/2020   Vitamin B12 deficiency (non anemic) 12/18/2020   Other long term (current) drug therapy 12/18/2020   CAP (community acquired pneumonia) 12/07/2019   Cellulitis 02/26/2019   Essential hypertension 11/03/2017   Sepsis ([place]) 11/03/2017   DM2 (diabetes mellitus, type 2) ([place]) 11/03/2017   Cellulitis of right leg 11/03/2017   Flank pain 11/03/2017   Chronic diastolic CHF (congestive heart failure) ([place]) 03/19/2017   Diabetes mellitus ([place]) 03/19/2017   Obesity, diabetes, and hypertension syndrome ([place]) 03/19/2017   CKD (chronic kidney disease) stage 3, GFR 30-59 ml/min (HCC) 03/19/2017   Syncope 10/24/2016   Pulmonary hypertension ([place]) 12/07/2015   Varicose veins of leg with complications [id number]   Varicose veins of left lower extremity with complications [id number]   Varicose veins of lower extremities with other complications [id number]   Lipoma of back 07/19/2012    Orientation RESPIRATION BLADDER Height & Weight     Self, Situation, Place  O2 (4L nasal cannula) Continent, External catheter Weight: 197 lb 15.6 oz (89.8 kg) Height:  5' 4.02" (162.6 cm)  BEHAVIORAL SYMPTOMS/MOOD NEUROLOGICAL BOWEL NUTRITION STATUS      Continent Diet (See dc summary)  AMBULATORY STATUS COMMUNICATION OF NEEDS Skin   Extensive Assist Verbally Surgical wounds, Other (Comment) (Closed incision on back; skin tear on pretibial)                       Personal Care Assistance Level of Assistance  Bathing, Feeding, Dressing Bathing Assistance: Maximum assistance Feeding assistance: Limited assistance Dressing Assistance: Maximum assistance     Functional Limitations Info  Sight, Hearing Sight  Info: Impaired Hearing Info: Impaired      SPECIAL CARE FACTORS FREQUENCY  PT (By licensed PT), OT (By licensed OT)     PT Frequency: 5x/week OT Frequency: 5x/week            Contractures Contractures Info: Not present    Additional Factors Info  Code Status, Allergies,  Isolation Precautions Code Status Info: Full Allergies Info: Buspirone, Cephalexin, Micardis (Telmisartan), Atorvastatin, Canagliflozin, Doxycycline Hyclate, Lipitor (Atorvastatin Calcium), Tramadol     Isolation Precautions Info: MRSA     Current Medications (07/06/2022):  This is the current hospital active medication list Current Facility-Administered Medications  Medication Dose Route Frequency Provider Last Rate Last Admin   0.9 %  sodium chloride infusion  250 mL Intravenous Continuous Pool, Henry, MD       0.9 %  sodium chloride infusion   Intravenous Continuous Charlynne Cousins, MD 75 mL/hr at 07/06/22 0832 New Bag at 07/06/22 [id number]   acetaminophen (TYLENOL) tablet 650 mg  650 mg Oral Q4H PRN Earnie Larsson, MD       Or   acetaminophen (TYLENOL) suppository 650 mg  650 mg Rectal Q4H PRN Earnie Larsson, MD       amLODipine (NORVASC) tablet 10 mg  10 mg Oral Daily Jacky Kindle, MD   10 mg at 07/06/22 [id number]   benzonatate (TESSALON) capsule 200 mg  200 mg Oral TID PRN Earnie Larsson, MD   200 mg at 07/04/22 2039   Chlorhexidine Gluconate Cloth 2 % PADS 6 each  6 each Topical Daily Earnie Larsson, MD   6 each at 07/03/2022 1946   haloperidol lactate (HALDOL) injection 2 mg  2 mg Intravenous Q6H PRN Charlynne Cousins, MD       hydrALAZINE (APRESOLINE) injection 10 mg  10 mg Intravenous Q4H PRN Anders Simmonds, MD   10 mg at 07/06/22 1606   HYDROcodone-acetaminophen ([place]) 10-325 MG per tablet 2 tablet  2 tablet Oral Q4H PRN Earnie Larsson, MD   2 tablet at 07/06/22 0558   HYDROmorphone (DILAUDID) injection 0.5 mg  0.5 mg Intravenous Q4H PRN Earnie Larsson, MD   0.5 mg at 07/18/2022 1243   HYDROmorphone (DILAUDID) injection 1 mg  1 mg Intravenous Q2H PRN Earnie Larsson, MD   1 mg at 07/06/22 1605   insulin aspart (novoLOG) injection 0-5 Units  0-5 Units Subcutaneous QHS Charlynne Cousins, MD       insulin aspart (novoLOG) injection 0-9 Units  0-9 Units Subcutaneous TID WC Charlynne Cousins, MD   2 Units  at 07/06/22 1222   insulin glargine-yfgn (SEMGLEE) injection 8 Units  8 Units Subcutaneous BID Earnie Larsson, MD   8 Units at 07/06/22 0926   ipratropium-albuterol (DUONEB) 0.5-2.5 (3) MG/3ML nebulizer solution 3 mL  3 mL Nebulization Q6H PRN Earnie Larsson, MD   3 mL at 07/26/2022 2115   isosorbide mononitrate (IMDUR) 24 hr tablet 60 mg  60 mg Oral Daily Earnie Larsson, MD   60 mg at 07/06/22 [id number]   melatonin tablet 3 mg  3 mg Oral QHS PRN Earnie Larsson, MD       menthol-cetylpyridinium (CEPACOL) lozenge 3 mg  1 lozenge Oral PRN Earnie Larsson, MD       Or   phenol (CHLORASEPTIC) mouth spray 1 spray  1 spray Mouth/Throat PRN Earnie Larsson, MD       methocarbamol (ROBAXIN) tablet 500 mg  500 mg Oral Q6H PRN Earnie Larsson, MD   500 mg at 07/09/2022 1243  mupirocin ointment (BACTROBAN) 2 % 1 Application  1 Application Nasal BID Earnie Larsson, MD   1 Application at [id number] 2116   ondansetron (ZOFRAN) tablet 4 mg  4 mg Oral Q6H PRN Earnie Larsson, MD       Or   ondansetron [facility]) injection 4 mg  4 mg Intravenous Q6H PRN Earnie Larsson, MD       oxyCODONE-acetaminophen (PERCOCET/ROXICET) 5-325 MG per tablet 1 tablet  1 tablet Oral Q6H PRN Earnie Larsson, MD   1 tablet at 07/06/22 0926   pantoprazole (PROTONIX) EC tablet 40 mg  40 mg Oral Daily Earnie Larsson, MD   40 mg at 07/06/22 0926   polyethylene glycol (MIRALAX / GLYCOLAX) packet 17 g  17 g Oral Daily Earnie Larsson, MD   17 g at 07/06/22 0926   polyethylene glycol (MIRALAX / GLYCOLAX) packet 17 g  17 g Oral BID Charlynne Cousins, MD       senna-docusate (Senokot-S) tablet 1 tablet  1 tablet Oral BID Earnie Larsson, MD   1 tablet at 07/06/22 0926   sodium chloride flush (NS) 0.9 % injection 3 mL  3 mL Intravenous Q12H Earnie Larsson, MD   3 mL at 07/10/2022 2126   sodium chloride flush (NS) 0.9 % injection 3 mL  3 mL Intravenous PRN Earnie Larsson, MD       sodium chloride tablet 2 g  2 g Oral BID WC Earnie Larsson, MD   2 g at 07/06/22 0825   umeclidinium-vilanterol (ANORO ELLIPTA)  62.5-25 MCG/ACT 1 puff  1 puff Inhalation Daily Pool, Mallie Mussel, MD       vancomycin (VANCOCIN) IVPB 1000 mg/200 mL premix  1,000 mg Intravenous Daily Earnie Larsson, MD 200 mL/hr at 07/06/22 0700 Infusion Verify at 07/06/22 0700     Discharge Medications: Please see discharge summary for a list of discharge medications.  Relevant Imaging Results:  Relevant Lab Results:   Additional Information [id number]. Will need long term IV Vancomycin  Benard Halsted, LCSW

## 2022-07-06 NOTE — Progress Notes (Signed)
Postop day 1.  No new problems overnight.  Back pain well-controlled.  Lower extremity pain improved.  Patient with urinary retention overnight.  She is afebrile.  Her vital signs are stable.  She is oxygenating well.  She is awake and alert.  She is oriented and appropriate.  Her motor examination reveals 4+/5 strength in both lower extremities.  Dressing dry.  Drain output low.  Cultures pending but consistent with staph.  Patient with MRSA screen positive.  Remains on vancomycin and Rocephin.  Status post thoracic decompressive laminectomy and evacuation of spontaneous epidural abscess.  Overall doing reasonably well.  Pulmonary status doing surprisingly well.  Continue ICU observation and antibiotics for now.  Mobilize with therapy.

## 2022-07-06 NOTE — Evaluation (Signed)
Physical Therapy Evaluation Patient Details Name: [name] MRN: [id number] DOB: [birth year] Today's Date: 07/06/2022  History of Present Illness  89 yo female presenting 3/8 with abdominal pain. Work up revealed CAP. Pt transferred to [facility] on 3/10 after MRI showed "extensive epidural abscess extending from at least C7 / T1 caudally through the L1-2 with multifocal mod / severe compression of the spinal cord at T8-9, T10-11, T11-12" And is now s/p T9-11 laminectomy with evacuation of epidural abscess on 3/10. PMH includes: cellulitis of Left leg, CHF, COPD, DM II, glaucoma, HLD, and HTN.   Clinical Impression  Pt in bed upon arrival of PT, agreeable to evaluation at this time. Prior to admission the pt was ambulating using furniture walking for short distances in the home, reports no recent falls, but does use assist from daughter for ADLs. The pt now presents with limitations in functional mobility, strength, power, endurance, and dynamic stability due to above dx, and will continue to benefit from skilled PT to address these deficits. The pt required modA of 2 or maxA of 1 to power up to standing from EOB and assist of 2 to manage pivotal steps to [facility] from EOB. The pt demos poor sequencing of OOB mobility, and needs max cues for safety and technique in addition to physical assist to maintain upright. Given level of assist needed at this time and that pt has no family who can provide physical assistance throughout the day, recommend SNF rehab at d/c.        Recommendations for follow up therapy are one component of a multi-disciplinary discharge planning process, led by the attending physician.  Recommendations may be updated based on patient status, additional functional criteria and insurance authorization.  Follow Up Recommendations Skilled nursing-short term rehab (<3 hours/day) Can patient physically be transported by private vehicle: No    Assistance Recommended at Discharge Frequent or  constant Supervision/Assistance  Patient can return home with the following  Two people to help with walking and/or transfers;Two people to help with bathing/dressing/bathroom;Direct supervision/assist for medications management;Direct supervision/assist for financial management;Assist for transportation;Help with stairs or ramp for entrance    Equipment Recommendations Other (comment) (defer to post acute)  Recommendations for Other Services       Functional Status Assessment Patient has had a recent decline in their functional status and demonstrates the ability to make significant improvements in function in a reasonable and predictable amount of time.     Precautions / Restrictions Precautions Precautions: Fall Precaution Comments: a-line LUE, fall Restrictions Weight Bearing Restrictions: No      Mobility  Bed Mobility Overal bed mobility: Needs Assistance Bed Mobility: Rolling, Sidelying to Sit, Sit to Supine Rolling: Min assist Sidelying to sit: Mod assist, +2 for physical assistance, HOB elevated   Sit to supine: Max assist, +2 for physical assistance   General bed mobility comments: pt able to roll with minA, but needed assist at legs and trunk to come to sitting EOB as well as assist to scoot to EOB. maxA to return to supine as pt unable to return LE to bed    Transfers Overall transfer level: Needs assistance Equipment used: 2 person hand held assist Transfers: Sit to/from Stand, Bed to chair/wheelchair/BSC Sit to Stand: Max assist (or modA of 2)   Step pivot transfers: Mod assist, +2 physical assistance       General transfer comment: modA of 2 or maxA of 1 to stand, pt not initiating movement, limited power in LE to  rise. max cues and assist to facilitate steps to pivot to [facility]    Ambulation/Gait               General Gait Details: deferred due to assist needed for lateral steps to [facility] and pt HR elevation with pivotal steps     Balance Overall  balance assessment: Needs assistance Sitting-balance support: Feet supported, Bilateral upper extremity supported Sitting balance-Leahy Scale: Poor Sitting balance - Comments: dependent on BUE support, pt leaning posteriorly   Standing balance support: Bilateral upper extremity supported, During functional activity Standing balance-Leahy Scale: Poor                               Pertinent Vitals/Pain Pain Assessment Pain Assessment: Faces Faces Pain Scale: Hurts little more Pain Location: back Pain Descriptors / Indicators: Grimacing Pain Intervention(s): Limited activity within patient's tolerance, Premedicated before session, Monitored during session, Repositioned    Home Living Family/patient expects to be discharged to:: Private residence Living Arrangements: Spouse/significant other;Children (spouse is unable to physically assist. daughter is caretaker) Available Help at Discharge: Family;Available PRN/intermittently (daughter in morning and night, checks on them during lunch) Type of Home: House Home Access: Ramped entrance       Home Layout: One level Home Equipment: Cane - single point;Rollator (4 wheels) (possible shower seat)      Prior Function Prior Level of Function : Independent/Modified Independent             Mobility Comments: furniture walks in the home, may use rollator to go to MD appointements ADLs Comments: assist for ADLs and IADLs from daughter     Hand Dominance   Dominant Hand: Right    Extremity/Trunk Assessment   Upper Extremity Assessment Upper Extremity Assessment: Defer to OT evaluation    Lower Extremity Assessment Lower Extremity Assessment: Generalized weakness (generally weak with pt needing assist to move against gravity and to power to standing. significant bruising and fragile skin throughout. no focal changes in sensation or weakness)    Cervical / Trunk Assessment Cervical / Trunk Assessment: Kyphotic;Other  exceptions Cervical / Trunk Exceptions: large body habitus  Communication   Communication: HOH  Cognition Arousal/Alertness: Awake/alert Behavior During Therapy: Flat affect Overall Cognitive Status: Difficult to assess                                 General Comments: pt very HOH, able to answer some questions posed by therapists, son present and assisting with bacground information. pt needing repeated cues for safety and to initiate movements. per son, pt cognition close to baseline        General Comments General comments (skin integrity, edema, etc.): fragile skin, skin tear occured on calf of RLE during session while assisting pt's legs back into bed. 6L O2 used in session with SpO2 >92%. HR max 142bpm    Exercises     Assessment/Plan    PT Assessment Patient needs continued PT services  PT Problem List Decreased strength;Decreased activity tolerance;Decreased balance;Decreased mobility       PT Treatment Interventions Functional mobility training;Gait training;Therapeutic activities;Therapeutic exercise;Balance training    PT Goals (Current goals can be found in the Care Plan section)  Acute Rehab PT Goals Patient Stated Goal: to go home home PT Goal Formulation: With patient Time For Goal Achievement: 07/20/22 Potential to Achieve Goals: Fair    Frequency Min  3X/week     Co-evaluation PT/OT/SLP Co-Evaluation/Treatment: Yes Reason for Co-Treatment: For patient/therapist safety;Complexity of the patient's impairments (multi-system involvement);To address functional/ADL transfers PT goals addressed during session: Balance;Mobility/safety with mobility;Strengthening/ROM         AM-PAC PT "6 Clicks" Mobility  Outcome Measure Help needed turning from your back to your side while in a flat bed without using bedrails?: A Lot Help needed moving from lying on your back to sitting on the side of a flat bed without using bedrails?: A Lot Help needed moving  to and from a bed to a chair (including a wheelchair)?: Total Help needed standing up from a chair using your arms (e.g., wheelchair or bedside chair)?: Total Help needed to walk in hospital room?: Total Help needed climbing 3-5 steps with a railing? : Total 6 Click Score: 8    End of Session Equipment Utilized During Treatment: Gait belt;Oxygen Activity Tolerance: Patient limited by fatigue Patient left: in bed;with call bell/phone within reach Nurse Communication: Mobility status (skin tear, O2 needs) PT Visit Diagnosis: Other abnormalities of gait and mobility (R26.89);Muscle weakness (generalized) (M62.81)    Time: [id number] PT Time Calculation (min) (ACUTE ONLY): 43 min   Charges:   PT Evaluation $PT Eval Moderate Complexity: 1 Mod PT Treatments $Therapeutic Activity: 8-22 mins        West Carbo, PT, DPT   Acute Rehabilitation Department Office [phone] Secure Chat Communication Preferred  Sandra Cockayne 07/06/2022, 12:03 PM

## 2022-07-06 NOTE — Progress Notes (Signed)
  Echocardiogram 2D Echocardiogram has been performed.  [name] 07/06/2022, 2:28 PM

## 2022-07-06 NOTE — Consult Note (Signed)
[facility]    Date of Admission:  06/26/2022     Reason for Consult: mrsa bsi, vertebral om/epidural abscess    Referring Provider: Pool     Lines:  3/10-c Right ij central line 3/10-c left radial a-line   Abx: 3/10-c vanc  3/10-11 ceftriaxone        Assessment: 89 yo female  copd chronic 4 liters o2 at night, hx bilateral LE dvt on xarelto, dm2, htn, HFpEF, recent admission 2 weeks prior to admission for copd exacerbation, admitted 3/9 with sepsis found to have mrsa bacteremia and extensive thoracolumbar epidural abscess/om   She has no hardware/ppm present   She is s/p I&D by NSG on 3/10 -- cx appear to be growing staph aureus 3/09 bcx mrsa 3/11 repeat bcx in progress   Unclear source. No rash/new thrombophlebitis finding   Tte is pending   Plan: Deescalate to just MRSA coverage; can stop ceftriaxone today Repeat bcx in progress Tte in progress Will see if bsi clears, otherwise will investigate with appropriate imaging for source control Discussed with primary team    I spent 75 minute reviewing data/chart, and coordinating care and >50% direct face to face time providing counseling/discussing diagnostics/treatment plan with patient   ------------------------------------------------ Principal Problem:   CAP (community acquired pneumonia) Active Problems:   Chronic diastolic CHF (congestive heart failure) (HCC)   Essential hypertension   DM2 (diabetes mellitus, type 2) (HCC)   COPD (chronic obstructive pulmonary disease) ([facility])   Acute hyponatremia   History of DVT (deep vein thrombosis)   Anemia of chronic disease   Epidural abscess   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    HPI: [name] is a 89 y.o. female copd chronic 4 liters o2 at night, hx bilateral LE dvt on xarelto, dm2, htn, HFpEF, recent admission 2 weeks prior to admission for copd exacerbation, admitted 3/9 with sepsis found to have  mrsa bacteremia and extensive thoracolumbar epidural abscess/om  Chart reviewed Discussed case with patient and her family members who are available at bedside  Patient was admitted 2/26 to [facility] for copd exacerbation given 12 day pred taper. Bcx negative at that time  She represented with right sidec chest/abd pain, cough, subjective f/c and rigors, and malaise, myalgia and severe back pain  In the ed was febrile. Satting well on home 4 liters o2 Mri spine as above Taken to I&D emergently by nsg Bcx mrsa Operative cx gpc   On vanc/ceftriaxone  Today pain better in the back No n/v/diarrhea/rash No other complaint    No ppm/no other hardware  Family History  Problem Relation Age of Onset   Stroke Mother     Social History   Tobacco Use   Smoking status: Never   Smokeless tobacco: Never  Vaping Use   Vaping Use: Never used  Substance Use Topics   Alcohol use: No   Drug use: No    Allergies  Allergen Reactions   Buspirone Shortness Of Breath and Other (See Comments)    "couldn't breathe"   Cephalexin Anaphylaxis, Swelling and Other (See Comments)   Micardis [Telmisartan] Other (See Comments)    Hyperkalemia during hospitalization with pulmonary edema   Atorvastatin Other (See Comments)    Myalgia    Canagliflozin Other (See Comments)    Pt does not recall reaction   Doxycycline Hyclate Nausea Only   Lipitor [Atorvastatin Calcium] Other (See Comments)  Myalgia    Tramadol Nausea Only    Review of Systems: ROS All Other ROS was negative, except mentioned above   Past Medical History:  Diagnosis Date   Cellulitis and abscess of left leg    CHF (congestive heart failure) (HCC)    COPD (chronic obstructive pulmonary disease) (HCC)    Diabetes mellitus    Glaucoma    Hyperlipidemia    Hypertension        Scheduled Meds:  amLODipine  10 mg Oral Daily   Chlorhexidine Gluconate Cloth  6 each Topical Daily   insulin aspart  0-5  Units Subcutaneous QHS   insulin aspart  0-9 Units Subcutaneous TID WC   insulin glargine-yfgn  8 Units Subcutaneous BID   isosorbide mononitrate  60 mg Oral Daily   mupirocin ointment  1 Application Nasal BID   pantoprazole  40 mg Oral Daily   polyethylene glycol  17 g Oral Daily   polyethylene glycol  17 g Oral BID   senna-docusate  1 tablet Oral BID   sodium chloride flush  3 mL Intravenous Q12H   sodium chloride  2 g Oral BID WC   umeclidinium-vilanterol  1 puff Inhalation Daily   Continuous Infusions:  sodium chloride     sodium chloride 75 mL/hr at 07/06/22 0832   vancomycin 200 mL/hr at 07/06/22 0700   PRN Meds:.acetaminophen **OR** acetaminophen, benzonatate, hydrALAZINE, HYDROcodone-acetaminophen, HYDROmorphone (DILAUDID) injection, HYDROmorphone (DILAUDID) injection, ipratropium-albuterol, melatonin, menthol-cetylpyridinium **OR** phenol, methocarbamol, ondansetron **OR** ondansetron (ZOFRAN) IV, oxyCODONE-acetaminophen, sodium chloride flush   OBJECTIVE: Blood pressure (!) 131/58, pulse 80, temperature 98.3 F (36.8 C), temperature source Oral, resp. rate 13, height 5' 4.02" (1.626 m), weight 89.8 kg, SpO2 98 %.  Physical Exam  General/constitutional: no distress, pleasant HEENT: Normocephalic, PER, Conj Clear, EOMI, Oropharynx clear Neck supple CV: rrr no mrg Lungs: clear to auscultation, normal respiratory effort Abd: Soft, Nontender Ext: no edema Skin: No Rash Neuro: nonfocal MSK: no peripheral joint swelling/tenderness/warmth   Central line presence: right ij site no discharge/erythema; left radial-aline site no discharge/erythema   Lab Results Lab Results  Component Value Date   WBC 21.6 (H) 07/06/2022   HGB 9.7 (L) 07/06/2022   HCT 29.9 (L) 07/06/2022   MCV 90.3 07/06/2022   PLT PLATELET CLUMPS NOTED ON SMEAR, UNABLE TO ESTIMATE 07/06/2022    Lab Results  Component Value Date   CREATININE 0.94 07/06/2022   BUN 19 07/06/2022   NA 130 (L)  07/06/2022   K 3.8 07/06/2022   CL 90 (L) 07/06/2022   CO2 23 07/06/2022    Lab Results  Component Value Date   ALT 24 07/06/2022   AST 24 07/06/2022   ALKPHOS 144 (H) 07/06/2022   BILITOT 0.6 07/06/2022      Microbiology: Recent Results (from the past 240 hour(s))  Resp panel by RT-PCR (RSV, Flu A&B, Covid)     Status: None   Collection Time: 07/04/22  1:23 AM  Result Value Ref Range Status   SARS Coronavirus 2 by RT PCR NEGATIVE NEGATIVE Final    Comment: (NOTE) SARS-CoV-2 target nucleic acids are NOT DETECTED.  The SARS-CoV-2 RNA is generally detectable in upper respiratory specimens during the acute phase of infection. The lowest concentration of SARS-CoV-2 viral copies this assay can detect is 138 copies/mL. A negative result does not preclude SARS-Cov-2 infection and should not be used as the sole basis for treatment or other patient management decisions. A negative result may occur with  improper specimen  collection/handling, submission of specimen other than nasopharyngeal swab, presence of viral mutation(s) within the areas targeted by this assay, and inadequate number of viral copies(<138 copies/mL). A negative result must be combined with clinical observations, patient history, and epidemiological information. The expected result is Negative.  Fact Sheet for Patients:  EntrepreneurPulse.com.au  Fact Sheet for Healthcare Providers:  IncredibleEmployment.be  This test is no t yet approved or cleared by the [place] FDA and  has been authorized for detection and/or diagnosis of SARS-CoV-2 by FDA under an Emergency Use Authorization (EUA). This EUA will remain  in effect (meaning this test can be used) for the duration of the COVID-19 declaration under Section 564(b)(1) of the Act, 21 U.S.C.section 360bbb-3(b)(1), unless the authorization is terminated  or revoked sooner.       Influenza A by PCR NEGATIVE NEGATIVE  Final   Influenza B by PCR NEGATIVE NEGATIVE Final    Comment: (NOTE) The Xpert Xpress SARS-CoV-2/FLU/RSV plus assay is intended as an aid in the diagnosis of influenza from Nasopharyngeal swab specimens and should not be used as a sole basis for treatment. Nasal washings and aspirates are unacceptable for Xpert Xpress SARS-CoV-2/FLU/RSV testing.  Fact Sheet for Patients: EntrepreneurPulse.com.au  Fact Sheet for Healthcare Providers: IncredibleEmployment.be  This test is not yet approved or cleared by the [place] FDA and has been authorized for detection and/or diagnosis of SARS-CoV-2 by FDA under an Emergency Use Authorization (EUA). This EUA will remain in effect (meaning this test can be used) for the duration of the COVID-19 declaration under Section 564(b)(1) of the Act, 21 U.S.C. section 360bbb-3(b)(1), unless the authorization is terminated or revoked.     Resp Syncytial Virus by PCR NEGATIVE NEGATIVE Final    Comment: (NOTE) Fact Sheet for Patients: EntrepreneurPulse.com.au  Fact Sheet for Healthcare Providers: IncredibleEmployment.be  This test is not yet approved or cleared by the [place] FDA and has been authorized for detection and/or diagnosis of SARS-CoV-2 by FDA under an Emergency Use Authorization (EUA). This EUA will remain in effect (meaning this test can be used) for the duration of the COVID-19 declaration under Section 564(b)(1) of the Act, 21 U.S.C. section 360bbb-3(b)(1), unless the authorization is terminated or revoked.  Performed at [facility], [place] [address], [place], [place] [address]   Blood culture (routine x 2)     Status: None (Preliminary result)   Collection Time: 07/04/22  5:22 AM   Specimen: BLOOD  Result Value Ref Range Status   Specimen Description   Final    BLOOD LEFT ANTECUBITAL Performed at [facility] [address] [place], [place] [address]    Special Requests   Final    BOTTLES DRAWN AEROBIC AND ANAEROBIC Blood Culture results may not be optimal due to an excessive volume of blood received in culture bottles Performed at [facility] [address] [place], [place] [address]    Culture  Setup Time   Final    GRAM POSITIVE COCCI IN CLUSTERS ANAEROBIC BOTTLE ONLY CRITICAL VALUE NOTED.  VALUE IS CONSISTENT WITH PREVIOUSLY REPORTED AND CALLED VALUE. Performed at [facility], [place] [address] [place], [place] [address]    Culture GRAM POSITIVE COCCI  Final   Report Status PENDING  Incomplete  Blood culture (routine x 2)     Status: None (Preliminary result)   Collection Time: 07/04/22  6:56 AM   Specimen: BLOOD RIGHT HAND  Result Value Ref Range Status  Specimen Description   Final    BLOOD RIGHT HAND Performed at [facility], [place] [address], [place], [place] [address]    Special Requests   Final    BOTTLES DRAWN AEROBIC AND ANAEROBIC Blood Culture results may not be optimal due to an excessive volume of blood received in culture bottles Performed at [facility] [address] [place], [place] [address]    Culture  Setup Time   Final    GRAM POSITIVE COCCI IN CLUSTERS IN BOTH AEROBIC AND ANAEROBIC BOTTLES CRITICAL RESULT CALLED TO, READ BACK BY AND VERIFIED WITH: PHARMD J. LEGGE 07/06/2022 @ 0728 BY AB Performed at [facility], [place] [address], [place], [place] [address]    Culture GRAM POSITIVE COCCI  Final   Report Status PENDING  Incomplete  Blood Culture ID Panel (Reflexed)     Status: Abnormal   Collection Time: 07/04/22  6:56 AM  Result Value Ref Range Status   Enterococcus faecalis NOT DETECTED NOT DETECTED Final   Enterococcus Faecium NOT DETECTED NOT DETECTED Final   Listeria monocytogenes NOT DETECTED NOT DETECTED Final   Staphylococcus species DETECTED (A) NOT DETECTED Final    Comment: CRITICAL RESULT  CALLED TO, READ BACK BY AND VERIFIED WITH: PHARMD J. LEGGE 07/09/2022 @ 0728 BY AB    Staphylococcus aureus (BCID) DETECTED (A) NOT DETECTED Final    Comment: Methicillin (oxacillin)-resistant Staphylococcus aureus (MRSA). MRSA is predictably resistant to beta-lactam antibiotics (except ceftaroline). Preferred therapy is vancomycin unless clinically contraindicated. Patient requires contact precautions if  hospitalized. CRITICAL RESULT CALLED TO, READ BACK BY AND VERIFIED WITH: PHARMD J. LEGGE 07/14/2022 @ 0728 BY AB    Staphylococcus epidermidis NOT DETECTED NOT DETECTED Final   Staphylococcus lugdunensis NOT DETECTED NOT DETECTED Final   Streptococcus species NOT DETECTED NOT DETECTED Final   Streptococcus agalactiae NOT DETECTED NOT DETECTED Final   Streptococcus pneumoniae NOT DETECTED NOT DETECTED Final   Streptococcus pyogenes NOT DETECTED NOT DETECTED Final   A.calcoaceticus-baumannii NOT DETECTED NOT DETECTED Final   Bacteroides fragilis NOT DETECTED NOT DETECTED Final   Enterobacterales NOT DETECTED NOT DETECTED Final   Enterobacter cloacae complex NOT DETECTED NOT DETECTED Final   Escherichia coli NOT DETECTED NOT DETECTED Final   Klebsiella aerogenes NOT DETECTED NOT DETECTED Final   Klebsiella oxytoca NOT DETECTED NOT DETECTED Final   Klebsiella pneumoniae NOT DETECTED NOT DETECTED Final   Proteus species NOT DETECTED NOT DETECTED Final   Salmonella species NOT DETECTED NOT DETECTED Final   Serratia marcescens NOT DETECTED NOT DETECTED Final   Haemophilus influenzae NOT DETECTED NOT DETECTED Final   Neisseria meningitidis NOT DETECTED NOT DETECTED Final   Pseudomonas aeruginosa NOT DETECTED NOT DETECTED Final   Stenotrophomonas maltophilia NOT DETECTED NOT DETECTED Final   Candida albicans NOT DETECTED NOT DETECTED Final   Candida auris NOT DETECTED NOT DETECTED Final   Candida glabrata NOT DETECTED NOT DETECTED Final   Candida krusei NOT DETECTED NOT DETECTED Final   Candida  parapsilosis NOT DETECTED NOT DETECTED Final   Candida tropicalis NOT DETECTED NOT DETECTED Final   Cryptococcus neoformans/gattii NOT DETECTED NOT DETECTED Final   Meth resistant mecA/C and MREJ DETECTED (A) NOT DETECTED Final    Comment: CRITICAL RESULT CALLED TO, READ BACK BY AND VERIFIED WITH: PHARMD J. LEGGE 07/07/2022 @ 0728 BY AB Performed at [facility], [address] [place], [place] [address]   Surgical pcr screen     Status: Abnormal  Collection Time: 07/14/2022  2:15 PM   Specimen: Nasal Mucosa; Nasal Swab  Result Value Ref Range Status   MRSA, PCR POSITIVE (A) NEGATIVE Final    Comment: RESULT CALLED TO, READ BACK BY AND VERIFIED WITH:  Toney Sang, RN 07/18/2022 1803 A. LAFRANCE    Staphylococcus aureus POSITIVE (A) NEGATIVE Final    Comment: (NOTE) The Xpert SA Assay (FDA approved for NASAL specimens in patients 51 years of age and older), is one component of a comprehensive surveillance program. It is not intended to diagnose infection nor to guide or monitor treatment. Performed at [facility], [place] [address] [place], [place] [address]   Aerobic Culture w Gram Stain (superficial specimen)     Status: None (Preliminary result)   Collection Time: 07/16/2022  5:12 PM   Specimen: Abscess  Result Value Ref Range Status   Specimen Description WOUND  Final   Special Requests  THORACIC EPIDURAL ABCESS  Final   Gram Stain   Final    RARE WBC PRESENT, PREDOMINANTLY PMN FEW GRAM POSITIVE COCCI    Culture   Final    TOO YOUNG TO READ Performed at [facility], [place] [address], [place], [place] [address]    Report Status PENDING  Incomplete     Serology:    Imaging: If present, new imagings (plain films, ct scans, and mri) have been personally visualized and interpreted; radiology reports have been reviewed. Decision making incorporated into the Impression / Recommendations.  3/9 ct abd pelv with contrast 1. No acute intra-abdominal or pelvic  pathology. 2. Sigmoid diverticulosis. No bowel obstruction. Normal appendix. 3. Bibasilar subpleural streaky and reticular atelectasis or infiltrate. Aspiration is not excluded clinical correlation is recommended. 4.  Aortic Atherosclerosis   3/9 cxr FINDINGS: Stable cardiomegaly. Aortic atherosclerotic calcification. Pulmonary vascular congestion. Unchanged mild left basilar airspace opacities. No definite pleural effusion. No pneumothorax. No acute osseous abnormality. Postoperative changes right humeral head.   IMPRESSION: 1. Cardiomegaly with pulmonary vascular congestion. 2. Unchanged mild left basilar atelectasis/scarring.   3/10 mri lumbar thoracic spine wwo contrast 1. Longitudinally extensive epidural abscess extending from at least C7-T1 caudally through the L1-2. Based on the localizer images, suspect involvement of the cervical spinal canal as well. 2. Multifocal moderate to severe compression of the spinal cord at T8-9, T10-11, T11-12. 3. Disc edema with adjacent endplate marrow edema and enhancement at T6-7, T8-9, T10-11 and T11-12. Findings are suspicious for multifocal discitis-osteomyelitis, though there is little correlate on the recent CTA chest. 4. Clumping of the cauda equina nerve roots and conus with associated leptomeningeal enhancement, consistent with superimposed arachnoiditis.  Jabier Mutton, [facility] [place] [phone] pager    07/06/2022, 11:29 AM

## 2022-07-06 NOTE — Progress Notes (Signed)
[facility] PROGRESS NOTE    Progress Note  [name]  [id number] DOB: 1931-02-07 DOA: 07/24/2022 PCP: [name], DO     Brief Narrative:   [name] is an 89 y.o. female past medical history of chronic hypoxic respiratory failure on 4 L of oxygen, chronic diastolic heart failure, diabetes mellitus type 2 DVT on Xarelto admitted to [facility] on 07/04/2022 for right-sided chest discomfort found to be septic due to pneumonia started on Rocephin and azithromycin, on 07/20/2022 blood cultures were positive for MRSA, imaging showed an epidural abscess with cord compression disc edema, neurosurgery consulted transferred to [facility] for I&D of T9-T10 and T11 surgical intervention with laminectomy.  Significant events: 3/9 admit to [facility]. CAP coverage. 3/10 Bcx with MRSA. MRI spine for back pain -- > NSGY consult for epidural abscess. T to [facility] for emergent T9 10 11 lami, abscess evac  Assessment/Plan:   Sepsis due to MRSA bacteremia and extensive abscess of the thoracic lumbar spine specifically T9, T10-T11 status post laminectomy and I&D/bilateral lower lobe pneumonia: Blood cultures grew MRSA 07/03/2022, surveillance blood cultures on 07/24/2022 remain negative to date. Blood cultures have been repeated on 07/06/2022. MRI of the spine concern abscess underwent I&D and laminectomy by neurosurgery on 07/20/2022. 2D echo results are pending, will need a TEE will await echo results will need to consult ID. Continue narcotics for pain control.  Started on a bowel regimen.  Chronic respiratory failure with hypoxia and hypercarbia/COPD not in exacerbation: Currently on 4 L of oxygen in no acute distress continue inhalers.  Chronic diastolic heart failure: Patient appears dry continue strict I's and O's currently on IV fluids will be gentle with hydration. Continue IV fluids.  Essential hypertension: Hold hydrochlorothiazide continue amlodipine and Imdur. Blood pressure is stable  continue to monitor closely.  Hypovolemic hyponatremia: Restart IV fluids recheck basic metabolic panel in the morning.  History of DVT: Patient is on Xarelto at home is currently being held due to recent surgical intervention. Neurosurgery to dictate when to start anticoagulation.  Poorly controlled diabetes mellitus type 2 with hyperglycemia: A1c greater than 10 in January of this year. Currently on long-acting insulin plus sliding scale will change sliding scale to moderate with meal coverage as she is tolerating her diet.   DVT prophylaxis: scd Family Communication:none Status is: Inpatient Remains inpatient appropriate because: Bacteremic    Code Status:     Code Status Orders  (From admission, onward)           Start     Ordered   07/04/22 0215  Full code  Continuous       Question:  By:  Answer:  Consent: discussion documented in EHR   07/04/22 0215           Code Status History     Date Active Date Inactive Code Status Order ID Comments User Context   06/22/2022 1713 06/26/2022 1547 Full Code [id number]  [name], MD ED   05/13/2022 2258 05/22/2022 1846 Full Code [id number]  [name], MD ED   05/03/2022 1320 05/07/2022 2011 Full Code [id number]  [name], DO ED   12/07/2019 0617 12/09/2019 1943 Full Code [id number]  [name], MD Inpatient   02/26/2019 1741 02/28/2019 1720 Full Code [id number]  [name], MD Inpatient   02/26/2019 1642 02/26/2019 1700 Full Code [id number]  [name], MD ED   11/03/2017 0203 11/04/2017 1535 Partial Code [id number]  Ivor Costa, MD ED   03/19/2017  1756 03/21/2017 1407 Full Code [id number]  [name], MD Inpatient   10/24/2016 1430 10/27/2016 1604 Full Code [id number]  [name], MD ED   07/20/2016 1027 07/21/2016 0325 Full Code [id number]  [name], MD HOV         IV Access:   Peripheral IV   Procedures and diagnostic studies:   DG Thoracic Spine 1 View  Result Date: 06/26/2022 CLINICAL  DATA:  [id number] with elective surgery performed for extensive epidural abscess from C7-T1 through L1-2 with spinal cord compression. EXAM: OPERATIVE THORACIC SPINE 1 VIEW COMPARISON:  Thoracic and lumbar spine MRI yesterday. FINDINGS: Single lateral view is obtained from the mid T10 level through the sacrum. A metallic surgical instrument noted dorsal to T11. Trace discogenic retrolisthesis is again seen at L1-2 with otherwise unremarkable lateral alignment. There is moderate thoracolumbar spondylosis. Ankylosis across the L5-S1 disc is again shown. The discs show degenerative collapse from T11 down, as before. No endplate destructive changes are seen. All imaged vertebrae are normal in heights. There have been no significant interval changes radiographically. IMPRESSION: 1. Metallic surgical instrument dorsal to T11. No significant interval changes radiographically. 2. Thoracolumbar spondylosis and multilevel DDD without evidence of compression fractures or visible endplate destructive change. 3. Ankylosis at L5-S1. Electronically Signed   By: Telford Nab M.D.   On: 06/26/2022 21:08   DG Chest Port 1 View  Result Date: 07/11/2022 CLINICAL DATA:  Shortness of breath EXAM: PORTABLE CHEST 1 VIEW COMPARISON:  Chest radiograph 07/04/2022 FINDINGS: Central venous catheter tip projects over the superior vena cava. Stable cardiac and mediastinal contours. Aortic atherosclerosis. New heterogeneous opacities right mid lung. Bilateral interstitial pulmonary opacities. No pleural effusion or pneumothorax. Thoracic spine degenerative changes. IMPRESSION: New heterogeneous opacities right mid lung may represent atelectasis or infection. Recommend short-term follow-up chest radiograph to ensure resolution and exclude underlying nodule. Interstitial opacities may represent vascular redistribution. Superimposed mild edema not excluded. Electronically Signed   By: Lovey Newcomer M.D.   On: 07/06/2022 16:08   MR THORACIC SPINE W  WO CONTRAST  Result Date: 07/16/2022 CLINICAL DATA:  Low back pain, infection suspected. Intermittent back pain with MRSA bacteremia. EXAM: MRI THORACIC AND LUMBAR SPINE WITHOUT AND WITH CONTRAST TECHNIQUE: Multiplanar and multiecho pulse sequences of the thoracic and lumbar spine were obtained without and with intravenous contrast. CONTRAST:  92m GADAVIST GADOBUTROL 1 MMOL/ML IV SOLN COMPARISON:  CTA chest 06/30/2022.  CT abdomen/pelvis 07/04/2022. FINDINGS: MRI THORACIC SPINE FINDINGS Alignment:  Normal. Vertebrae: Disc edema with adjacent endplate marrow edema and enhancement at T6-7, T8-9, T10-11 and T11-12. However, there is little correlate on the recent CTA chest. Cord: Longitudinally extensive epidural abscess extending from at least C7-T1 caudally through the upper lumbar spine. Associated areas of leptomeningeal enhancement, consistent with superimposed arachnoiditis. Based on the localizer images, suspect extension into the cervical spinal canal as well. Paraspinal and other soft tissues: Atelectasis or consolidation in dependent portions of the lungs in dependent portions of the lungs. Disc levels: Multifocal moderate to severe compression of the spinal cord at T8-9, T10-11, T11-12. MRI LUMBAR SPINE FINDINGS Segmentation:  Standard. Alignment:  Normal. Vertebrae: Modic type 2 degenerative endplate marrow signal changes at L1-2, L2-3 and L4-5. No evidence of discitis-osteomyelitis in the lumbar spine. Conus medullaris: Extends to the L1-2 level. Clumping of the cauda equina nerve roots and conus with associated leptomeningeal enhancement, consistent with arachnoiditis. Paraspinal and other soft tissues: No acute findings. Disc levels: Mild spinal canal stenosis at L1-2 secondary to  facet arthropathy and central disc-osteophyte complex. IMPRESSION: 1. Longitudinally extensive epidural abscess extending from at least C7-T1 caudally through the L1-2. Based on the localizer images, suspect involvement of  the cervical spinal canal as well. 2. Multifocal moderate to severe compression of the spinal cord at T8-9, T10-11, T11-12. 3. Disc edema with adjacent endplate marrow edema and enhancement at T6-7, T8-9, T10-11 and T11-12. Findings are suspicious for multifocal discitis-osteomyelitis, though there is little correlate on the recent CTA chest. 4. Clumping of the cauda equina nerve roots and conus with associated leptomeningeal enhancement, consistent with superimposed arachnoiditis. Critical Value/emergent results were called by telephone at the time of interpretation on 06/27/2022 at 12:16 pm to provider [facility] , who verbally acknowledged these results. Electronically Signed   By: Emmit Alexanders M.D.   On: 07/11/2022 12:25   MR Lumbar Spine W Wo Contrast  Result Date: 07/10/2022 CLINICAL DATA:  Low back pain, infection suspected. Intermittent back pain with MRSA bacteremia. EXAM: MRI THORACIC AND LUMBAR SPINE WITHOUT AND WITH CONTRAST TECHNIQUE: Multiplanar and multiecho pulse sequences of the thoracic and lumbar spine were obtained without and with intravenous contrast. CONTRAST:  44m GADAVIST GADOBUTROL 1 MMOL/ML IV SOLN COMPARISON:  CTA chest 06/30/2022.  CT abdomen/pelvis 07/04/2022. FINDINGS: MRI THORACIC SPINE FINDINGS Alignment:  Normal. Vertebrae: Disc edema with adjacent endplate marrow edema and enhancement at T6-7, T8-9, T10-11 and T11-12. However, there is little correlate on the recent CTA chest. Cord: Longitudinally extensive epidural abscess extending from at least C7-T1 caudally through the upper lumbar spine. Associated areas of leptomeningeal enhancement, consistent with superimposed arachnoiditis. Based on the localizer images, suspect extension into the cervical spinal canal as well. Paraspinal and other soft tissues: Atelectasis or consolidation in dependent portions of the lungs in dependent portions of the lungs. Disc levels: Multifocal moderate to severe compression of the spinal  cord at T8-9, T10-11, T11-12. MRI LUMBAR SPINE FINDINGS Segmentation:  Standard. Alignment:  Normal. Vertebrae: Modic type 2 degenerative endplate marrow signal changes at L1-2, L2-3 and L4-5. No evidence of discitis-osteomyelitis in the lumbar spine. Conus medullaris: Extends to the L1-2 level. Clumping of the cauda equina nerve roots and conus with associated leptomeningeal enhancement, consistent with arachnoiditis. Paraspinal and other soft tissues: No acute findings. Disc levels: Mild spinal canal stenosis at L1-2 secondary to facet arthropathy and central disc-osteophyte complex. IMPRESSION: 1. Longitudinally extensive epidural abscess extending from at least C7-T1 caudally through the L1-2. Based on the localizer images, suspect involvement of the cervical spinal canal as well. 2. Multifocal moderate to severe compression of the spinal cord at T8-9, T10-11, T11-12. 3. Disc edema with adjacent endplate marrow edema and enhancement at T6-7, T8-9, T10-11 and T11-12. Findings are suspicious for multifocal discitis-osteomyelitis, though there is little correlate on the recent CTA chest. 4. Clumping of the cauda equina nerve roots and conus with associated leptomeningeal enhancement, consistent with superimposed arachnoiditis. Critical Value/emergent results were called by telephone at the time of interpretation on 07/03/2022 at 12:16 pm to provider [facility], who verbally acknowledged these results. Electronically Signed   By: WEmmit AlexandersM.D.   On: 07/04/2022 12:25     Medical Consultants:   None.   Subjective:    MYIYI EDDINGTONrelates her pain is controlled.  Objective:    Vitals:   07/06/22 0430 07/06/22 0500 07/06/22 0600 07/06/22 0720  BP:  123/70 (!) 123/100 (!) 149/73  Pulse: 95 93 (!) 108 (!) 112  Resp: '14 13 20 15  '$ Temp:  TempSrc:      SpO2: 99% 95% 93% 93%  Weight:  89.8 kg    Height:       SpO2: 93 % O2 Flow Rate (L/min): 4 L/min   Intake/Output Summary  (Last 24 hours) at 07/06/2022 0736 Last data filed at 07/06/2022 0700 Gross per 24 hour  Intake 2004.66 ml  Output 900 ml  Net 1104.66 ml   Filed Weights   07/10/2022 1419 07/21/2022 1853 07/06/22 0500  Weight: 83.4 kg 87.9 kg 89.8 kg    Exam: General exam: In no acute distress. Respiratory system: Good air movement and clear to auscultation. Cardiovascular system: S1 & S2 heard, RRR. No JVD, murmurs, rubs, gallops or clicks.  Gastrointestinal system: Abdomen is nondistended, soft and nontender.  Central nervous system: Alert and oriented. No focal neurological deficits. Extremities: No pedal edema. Skin: No rashes, lesions or ulcers Psychiatry: Judgement and insight appear normal. Mood & affect appropriate.    Data Reviewed:    Labs: Basic Metabolic Panel: Recent Labs  Lab 06/29/22 2342 07/20/2022 2228 07/04/22 0500 06/28/2022 0525 07/08/2022 1947 07/06/22 0438  NA 133* 124* 122* 124* 127* 130*  K 4.6 4.3 3.8 3.8 3.7 3.8  CL 96* 87* 85* 87* 89* 90*  CO2 '28 27 25 25 22 23  '$ GLUCOSE 376* 292* 239* 206* 125* 91  BUN 39* 28* 25* '22 20 19  '$ CREATININE 1.02* 1.07* 1.00 0.86 0.98 0.94  CALCIUM 8.7* 8.2* 8.1* 8.3* 8.3* 8.4*  MG 1.5* 1.3* 1.3* 1.8  --  1.7  PHOS  --   --  3.5  --   --  3.5   GFR Estimated Creatinine Clearance: 44 mL/min (by C-G formula based on SCr of 0.94 mg/dL). Liver Function Tests: Recent Labs  Lab 06/29/22 2342 07/09/2022 2228 07/04/22 0500 07/06/22 0438  AST 18 18 14* 24  ALT '12 23 20 24  '$ ALKPHOS 72 86 87 144*  BILITOT 0.5 1.5* 1.1 0.6  PROT 6.5 6.7 6.3* 5.6*  ALBUMIN 3.1* 2.9* 2.9* 2.0*   No results for input(s): "LIPASE", "AMYLASE" in the last 168 hours. No results for input(s): "AMMONIA" in the last 168 hours. Coagulation profile No results for input(s): "INR", "PROTIME" in the last 168 hours. COVID-19 Labs  No results for input(s): "DDIMER", "FERRITIN", "LDH", "CRP" in the last 72 hours.  Lab Results  Component Value Date   SARSCOV2NAA  NEGATIVE 07/04/2022   SARSCOV2NAA NEGATIVE 06/22/2022   SARSCOV2NAA POSITIVE (A) 05/03/2022   [place] NEGATIVE 12/06/2019    CBC: Recent Labs  Lab 06/29/22 2342 07/24/2022 2228 07/04/22 0500 07/04/2022 0525 07/21/2022 1947 07/06/22 0438  WBC 16.9* 27.1* 25.5* 23.6* 26.3* 21.6*  NEUTROABS 14.4* 25.3* 23.2*  --   --  18.9*  HGB 11.1* 11.6* 11.0* 10.7* 9.5* 9.7*  HCT 35.8* 35.5* 34.7* 34.0* 30.0* 29.9*  MCV 91.6 87.4 88.7 90.4 89.3 90.3  PLT PLATELET CLUMPS NOTED ON SMEAR, UNABLE TO ESTIMATE PLATELET CLUMPS NOTED ON SMEAR, UNABLE TO ESTIMATE PLATELET CLUMPS NOTED ON SMEAR, UNABLE TO ESTIMATE PLATELET CLUMPS NOTED ON SMEAR, UNABLE TO ESTIMATE PLATELET CLUMPS NOTED ON SMEAR, UNABLE TO ESTIMATE PLATELET CLUMPS NOTED ON SMEAR, UNABLE TO ESTIMATE   Cardiac Enzymes: No results for input(s): "CKTOTAL", "CKMB", "CKMBINDEX", "TROPONINI" in the last 168 hours. BNP (last 3 results) No results for input(s): "PROBNP" in the last 8760 hours. CBG: Recent Labs  Lab 06/27/2022 0816 07/18/2022 1206 06/27/2022 1415 07/22/2022 1730 07/11/2022 2107  GLUCAP 205* 268* 161* 81 139*   D-Dimer: No results  for input(s): "DDIMER" in the last 72 hours. Hgb A1c: No results for input(s): "HGBA1C" in the last 72 hours. Lipid Profile: No results for input(s): "CHOL", "HDL", "LDLCALC", "TRIG", "CHOLHDL", "LDLDIRECT" in the last 72 hours. Thyroid function studies: Recent Labs    07/04/22 0500  TSH 0.879   Anemia work up: No results for input(s): "VITAMINB12", "FOLATE", "FERRITIN", "TIBC", "IRON", "RETICCTPCT" in the last 72 hours. Sepsis Labs: Recent Labs  Lab 07/15/2022 2228 07/04/22 0500 06/28/2022 0525 07/03/2022 1947 07/06/22 0438  PROCALCITON 0.74  --   --   --   --   WBC 27.1* 25.5* 23.6* 26.3* 21.6*  LATICACIDVEN  --  1.2  --   --   --    Microbiology Recent Results (from the past 240 hour(s))  Resp panel by RT-PCR (RSV, Flu A&B, Covid)     Status: None   Collection Time: 07/04/22  1:23 AM  Result  Value Ref Range Status   SARS Coronavirus 2 by RT PCR NEGATIVE NEGATIVE Final    Comment: (NOTE) SARS-CoV-2 target nucleic acids are NOT DETECTED.  The SARS-CoV-2 RNA is generally detectable in upper respiratory specimens during the acute phase of infection. The lowest concentration of SARS-CoV-2 viral copies this assay can detect is 138 copies/mL. A negative result does not preclude SARS-Cov-2 infection and should not be used as the sole basis for treatment or other patient management decisions. A negative result may occur with  improper specimen collection/handling, submission of specimen other than nasopharyngeal swab, presence of viral mutation(s) within the areas targeted by this assay, and inadequate number of viral copies(<138 copies/mL). A negative result must be combined with clinical observations, patient history, and epidemiological information. The expected result is Negative.  Fact Sheet for Patients:  EntrepreneurPulse.com.au  Fact Sheet for Healthcare Providers:  IncredibleEmployment.be  This test is no t yet approved or cleared by the [place] FDA and  has been authorized for detection and/or diagnosis of SARS-CoV-2 by FDA under an Emergency Use Authorization (EUA). This EUA will remain  in effect (meaning this test can be used) for the duration of the COVID-19 declaration under Section 564(b)(1) of the Act, 21 U.S.C.section 360bbb-3(b)(1), unless the authorization is terminated  or revoked sooner.       Influenza A by PCR NEGATIVE NEGATIVE Final   Influenza B by PCR NEGATIVE NEGATIVE Final    Comment: (NOTE) The Xpert Xpress SARS-CoV-2/FLU/RSV plus assay is intended as an aid in the diagnosis of influenza from Nasopharyngeal swab specimens and should not be used as a sole basis for treatment. Nasal washings and aspirates are unacceptable for Xpert Xpress SARS-CoV-2/FLU/RSV testing.  Fact Sheet for  Patients: EntrepreneurPulse.com.au  Fact Sheet for Healthcare Providers: IncredibleEmployment.be  This test is not yet approved or cleared by the [place] FDA and has been authorized for detection and/or diagnosis of SARS-CoV-2 by FDA under an Emergency Use Authorization (EUA). This EUA will remain in effect (meaning this test can be used) for the duration of the COVID-19 declaration under Section 564(b)(1) of the Act, 21 U.S.C. section 360bbb-3(b)(1), unless the authorization is terminated or revoked.     Resp Syncytial Virus by PCR NEGATIVE NEGATIVE Final    Comment: (NOTE) Fact Sheet for Patients: EntrepreneurPulse.com.au  Fact Sheet for Healthcare Providers: IncredibleEmployment.be  This test is not yet approved or cleared by the [place] FDA and has been authorized for detection and/or diagnosis of SARS-CoV-2 by FDA under an Emergency Use Authorization (EUA). This EUA will remain in effect (  meaning this test can be used) for the duration of the COVID-19 declaration under Section 564(b)(1) of the Act, 21 U.S.C. section 360bbb-3(b)(1), unless the authorization is terminated or revoked.  Performed at [facility], [place] [address] [place], [place] [address]   Blood culture (routine x 2)     Status: None (Preliminary result)   Collection Time: 07/04/22  5:22 AM   Specimen: BLOOD  Result Value Ref Range Status   Specimen Description   Final    BLOOD LEFT ANTECUBITAL Performed at [place] [address], [place], [place] [address]    Special Requests   Final    BOTTLES DRAWN AEROBIC AND ANAEROBIC Blood Culture results may not be optimal due to an excessive volume of blood received in culture bottles Performed at [place] [address] [place], [place] [address]    Culture  Setup Time   Final    GRAM POSITIVE COCCI IN  CLUSTERS ANAEROBIC BOTTLE ONLY CRITICAL VALUE NOTED.  VALUE IS CONSISTENT WITH PREVIOUSLY REPORTED AND CALLED VALUE. Performed at [facility], [place] [address], [place], [place] [address]    Culture GRAM POSITIVE COCCI  Final   Report Status PENDING  Incomplete  Blood culture (routine x 2)     Status: None (Preliminary result)   Collection Time: 07/04/22  6:56 AM   Specimen: BLOOD RIGHT HAND  Result Value Ref Range Status   Specimen Description   Final    BLOOD RIGHT HAND Performed at [facility], [place] [address] [place], [place] [address]    Special Requests   Final    BOTTLES DRAWN AEROBIC AND ANAEROBIC Blood Culture results may not be optimal due to an excessive volume of blood received in culture bottles Performed at [place] [address], [place], [place] [address]    Culture  Setup Time   Final    GRAM POSITIVE COCCI IN CLUSTERS IN BOTH AEROBIC AND ANAEROBIC BOTTLES CRITICAL RESULT CALLED TO, READ BACK BY AND VERIFIED WITH: PHARMD J. LEGGE 07/23/2022 @ 0728 BY AB Performed at [facility], [place] [address], [place], [place] [address]    Culture GRAM POSITIVE COCCI  Final   Report Status PENDING  Incomplete  Blood Culture ID Panel (Reflexed)     Status: Abnormal   Collection Time: 07/04/22  6:56 AM  Result Value Ref Range Status   Enterococcus faecalis NOT DETECTED NOT DETECTED Final   Enterococcus Faecium NOT DETECTED NOT DETECTED Final   Listeria monocytogenes NOT DETECTED NOT DETECTED Final   Staphylococcus species DETECTED (A) NOT DETECTED Final    Comment: CRITICAL RESULT CALLED TO, READ BACK BY AND VERIFIED WITH: PHARMD J. LEGGE 07/26/2022 @ 0728 BY AB    Staphylococcus aureus (BCID) DETECTED (A) NOT DETECTED Final    Comment: Methicillin (oxacillin)-resistant Staphylococcus aureus (MRSA). MRSA is predictably resistant to beta-lactam antibiotics (except ceftaroline). Preferred therapy is vancomycin unless clinically  contraindicated. Patient requires contact precautions if  hospitalized. CRITICAL RESULT CALLED TO, READ BACK BY AND VERIFIED WITH: PHARMD J. LEGGE 06/27/2022 @ 0728 BY AB    Staphylococcus epidermidis NOT DETECTED NOT DETECTED Final   Staphylococcus lugdunensis NOT DETECTED NOT DETECTED Final   Streptococcus species NOT DETECTED NOT DETECTED Final   Streptococcus agalactiae NOT DETECTED NOT DETECTED Final   Streptococcus pneumoniae NOT DETECTED NOT DETECTED Final   Streptococcus pyogenes NOT DETECTED NOT DETECTED Final   A.calcoaceticus-baumannii NOT DETECTED NOT DETECTED  Final   Bacteroides fragilis NOT DETECTED NOT DETECTED Final   Enterobacterales NOT DETECTED NOT DETECTED Final   Enterobacter cloacae complex NOT DETECTED NOT DETECTED Final   Escherichia coli NOT DETECTED NOT DETECTED Final   Klebsiella aerogenes NOT DETECTED NOT DETECTED Final   Klebsiella oxytoca NOT DETECTED NOT DETECTED Final   Klebsiella pneumoniae NOT DETECTED NOT DETECTED Final   Proteus species NOT DETECTED NOT DETECTED Final   Salmonella species NOT DETECTED NOT DETECTED Final   Serratia marcescens NOT DETECTED NOT DETECTED Final   Haemophilus influenzae NOT DETECTED NOT DETECTED Final   Neisseria meningitidis NOT DETECTED NOT DETECTED Final   Pseudomonas aeruginosa NOT DETECTED NOT DETECTED Final   Stenotrophomonas maltophilia NOT DETECTED NOT DETECTED Final   Candida albicans NOT DETECTED NOT DETECTED Final   Candida auris NOT DETECTED NOT DETECTED Final   Candida glabrata NOT DETECTED NOT DETECTED Final   Candida krusei NOT DETECTED NOT DETECTED Final   Candida parapsilosis NOT DETECTED NOT DETECTED Final   Candida tropicalis NOT DETECTED NOT DETECTED Final   Cryptococcus neoformans/gattii NOT DETECTED NOT DETECTED Final   Meth resistant mecA/C and MREJ DETECTED (A) NOT DETECTED Final    Comment: CRITICAL RESULT CALLED TO, READ BACK BY AND VERIFIED WITH: PHARMD J. LEGGE 07/20/2022 @ 0728 BY AB Performed  at [facility], [address] [place], [place] [address]   Surgical pcr screen     Status: Abnormal   Collection Time: 07/13/2022  2:15 PM   Specimen: Nasal Mucosa; Nasal Swab  Result Value Ref Range Status   MRSA, PCR POSITIVE (A) NEGATIVE Final    Comment: RESULT CALLED TO, READ BACK BY AND VERIFIED WITH:  Toney Sang, RN 07/17/2022 1803 A. LAFRANCE    Staphylococcus aureus POSITIVE (A) NEGATIVE Final    Comment: (NOTE) The Xpert SA Assay (FDA approved for NASAL specimens in patients 29 years of age and older), is one component of a comprehensive surveillance program. It is not intended to diagnose infection nor to guide or monitor treatment. Performed at [facility], [place] [address], [place], [place] [address]   Aerobic Culture w Gram Stain (superficial specimen)     Status: None (Preliminary result)   Collection Time: 07/20/2022  5:12 PM   Specimen: Abscess  Result Value Ref Range Status   Specimen Description WOUND  Final   Special Requests  THORACIC EPIDURAL ABCESS  Final   Gram Stain   Final    RARE WBC PRESENT, PREDOMINANTLY PMN FEW GRAM POSITIVE COCCI Performed at [facility], [place] [address] [place], [place] [address]    Culture PENDING  Incomplete   Report Status PENDING  Incomplete     Medications:    amLODipine  10 mg Oral Daily   Chlorhexidine Gluconate Cloth  6 each Topical Daily   insulin aspart  0-15 Units Subcutaneous TID WC   insulin glargine-yfgn  8 Units Subcutaneous BID   isosorbide mononitrate  60 mg Oral Daily   mupirocin ointment  1 Application Nasal BID   pantoprazole  40 mg Oral Daily   polyethylene glycol  17 g Oral Daily   senna-docusate  1 tablet Oral BID   sodium chloride flush  3 mL Intravenous Q12H   sodium chloride  2 g Oral BID WC   umeclidinium-vilanterol  1 puff Inhalation Daily   Continuous Infusions:  sodium chloride     sodium chloride Stopped (07/06/22 0603)   cefTRIAXone (ROCEPHIN)  IV Stopped (  07/12/2022  2148)   vancomycin 200 mL/hr at 07/06/22 0700      LOS: 2 days   Charlynne Cousins  [facility]  07/06/2022, 7:36 AM

## 2022-07-06 NOTE — Progress Notes (Signed)
eLink Physician-Brief Progress Note Patient Name: [name] DOB: 1930-08-16 MRN: [id number]   Date of Service  07/06/2022  HPI/Events of Note  Hypertension - BP = 180/68 by A-line.   eICU Interventions  Plan: Hydralazine 10 mg IV Q 4 hours PRN SBP > 170 or DBP > 100.     Intervention Category Major Interventions: Hypertension - evaluation and management  Kmari Halter Cornelia Copa 07/06/2022, 6:43 AM

## 2022-07-06 NOTE — Progress Notes (Signed)
Patient awakened and began yelling help from room. RN entered to find patient agitated and yelling with HR in the 140's sustained and attempting to pull at central line and arterial line while yelling "I need a lawyer". RN unable to redirect or reorient patient. Reached out to Dr. Olevia Bowens to make him aware of situation. Dr. Olevia Bowens came to assess patient stating "It's called hospital delirium, restrain her." Placed restraints on patient and also obtained order for haldol for agitation. Patient calmed down, but HR still in 130's. Obtained order for metoprolol 5 mg. Patient now resting but still confused, stating "they're trying to kill me". Patient still restrained for safety, and arterial line removed due to bleeding at site noted by RN after restraints placed.

## 2022-07-06 NOTE — TOC Initial Note (Addendum)
Transition of Care [facility]) - Initial/Assessment Note    Patient Details  Name: [name] MRN: [id number] Date of Birth: Dec 07, 1930  Transition of Care [facility]) CM/SW Contact:    Benard Halsted, LCSW Phone Number: 07/06/2022, 4:12 PM  Clinical Narrative:                 CSW received SNF consult. Patient from home with daughter. Patient recently discharged to [facility] in January. Requested SNF days from [facility] to ensure patient is not in copay days, though she does have a secondary insurance.  CSW spoke with patient's daughter, [name]. She expressed understanding of PT recommendation and is agreeable to SNF placement at time of discharge as she works. She would like a different facility than [facility] where she reportedly used 26 days. CSW discussed insurance authorization process and will provide Medicare SNF ratings list. CSW will send out referrals for review and provide bed offers as available. Patient will need IV antibiotics.   Skilled Nursing Rehab Facilities-   [email or web address]   Ratings out of 5 stars (5 the highest)   Name Address  Phone # [place] Inspection Overall  [facility] [address], [place] '4 5 2 3  '$ [facility]  [address] [place], [place] [phone] '4 2 5 5  '$ [facility] [place], [place] '1 3 1 1  '$ [place], [place] '2 2 4 4  '$ [facility] [address] [place] '2 1 2 1  '$ [place] [address], [place] [phone] '3 3 4 4  '$ [facility] [address] [place] '4 1 3 2  '$ [facility] [address], [place] '4 1 3 2  '$ [address] ([place]) [place], [address] [phone] '3 1 2 1  '$ [facility] [address], [facility] [phone] '3 1 1 1  '$ [facility] [address], [facility]  [phone] '3 2 2 2  '$ [facility] ([place]) [place]. [address] [address], [place] [phone] '3 1 1 1  '$ [place] [address] [place] [address] '4 2 4 4          '$ [place] '4 1 3 2  '$ [facility]  [address] [place] '3 1 5 4  '$ [facility], [place], [address] [phone] '1 1 2 1  '$ [facility] [address] [facility] [phone] '2 2 4 4          '$ [address] (no [facility]) [place] [facility], [place] [phone] '5 5 5 5  '$ [facility] (No Humana) [address], [place] '4 1 4 3  '$ [facility] (No UHC) [place], [place] '5 5 5 5  '$ [facility] [address] [facility] [phone] '2 3 5 5  '$ [place] [address], [place] '1 1 2 1  '$ [facility] [address], [address] '3 1 1 1  '$ [place], [place] '5 2 5 5  '$ [facility]  [address] [place] '2 2 1 1  '$ [facility] [address] [place] '3 2 1 1  '$ [facility] [place], [place] '2 2 2 2          '$ [place] [phone]  [address] [place] [phone] '1 1 1 1  '$ [place] [address], [place]  [phone] '2 4 3 3  '$ [facility] [place] [address], [place] [phone] '3 2 3 3  '$ [place] [address], [place] '2 1 1 1  '$ [place] (No Humana) [address], [place] [phone] '2 2 3 3  '$ [facility]) [address], [place] [phone] '2 1 1 1          '$ [place] [facility] [place], [place] '5 4 5 5  '$ [facility])  [address], [place] '2 1 2 1  '$ [facility]) [place] [address] [place] [phone] '3 1 4 3  '$ [facility]  Rehab [address] [place] '3 3 4 4  '$ [address] [place], [place] '2 3 1 1  '$ [facility]) [address] [place] [phone] '2 1 4 3     '$ Expected Discharge Plan: [place] Barriers to Discharge: SNF Pending bed offer, Continued Medical Work up   Patient Goals and CMS Choice Patient states their goals for this hospitalization and ongoing recovery are:: Rehab          Expected Discharge Plan and Services In-house Referral: Clinical Social Work     Living arrangements for the past 2 months: Single Family Home                                      Prior Living Arrangements/Services Living arrangements for the past 2 months: Single Family Home Lives with:: Adult Children Patient language and need for interpreter reviewed:: Yes Do you feel safe going back to the place where you live?: Yes      Need for Family Participation in Patient Care: Yes (Comment) Care giver support system in place?: Yes (comment)   Criminal Activity/Legal Involvement Pertinent to Current Situation/Hospitalization: No - Comment as needed  Activities of Daily Living Home Assistive Devices/Equipment: Eyeglasses, Hearing aid, Oxygen ADL Screening (condition at time of admission) Patient's cognitive ability adequate to safely complete daily activities?: Yes Is the patient deaf or have difficulty hearing?: Yes Does the patient have difficulty seeing, even when wearing glasses/contacts?: No Does the patient have difficulty concentrating, remembering, or making decisions?: No Patient able to express need for assistance with ADLs?: Yes Does the patient have difficulty dressing or bathing?: Yes Independently performs ADLs?: Yes (appropriate for developmental age) Does the patient have difficulty walking or climbing stairs?: Yes Weakness of Legs: Both Weakness of Arms/Hands: None  Permission  Sought/Granted Permission sought to share information with : Facility Sport and exercise psychologist, Family Supports Permission granted to share information with : Yes, Verbal Permission Granted              Emotional Assessment Appearance:: Appears stated age Attitude/Demeanor/Rapport: Unable to Assess Affect (typically observed): Unable to Assess Orientation: : Oriented to Self, Oriented to Place Alcohol / Substance Use: Not Applicable Psych Involvement: No (comment)  Admission diagnosis:  CAP (community acquired pneumonia) [J18.9] Epidural abscess [G06.2] 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 [G06.1] Patient Active Problem List   Diagnosis Date Noted   MRSA bacteremia 07/06/2022   Vertebral osteomyelitis ([place]) 07/06/2022   Epidural abscess 07/20/2022   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 07/18/2022   Anemia of chronic disease 07/04/2022   COPD with  acute exacerbation ([place]) 06/22/2022   Rectal bleeding 05/17/2022   UTI (urinary tract infection) 05/16/2022   Pressure injury of skin 05/16/2022   Acute bronchitis 05/15/2022   Elevated lactic acid level 05/14/2022   Hyperkalemia 05/14/2022   Hyperosmolar hyperglycemic state (HHS) ([place]) 05/13/2022   History of DVT (deep vein thrombosis) 05/13/2022   COVID-19 virus infection 05/04/2022   COPD exacerbation ([place]) 05/04/2022   AKI (acute kidney injury) ([place]) 05/03/2022   Acute hyponatremia 05/03/2022   Elevated LFTs 05/03/2022   Chronic hypoxic respiratory failure ([place]) 05/03/2022   Anxiety 12/18/2020   Arthritis 12/18/2020   Aortic valve disorder 12/18/2020   COPD (chronic obstructive pulmonary disease) ([place]) 12/18/2020   Diabetic neuropathy ([place]) 12/18/2020   Diabetic renal disease ([place]) 12/18/2020   Heart failure ([place]) 12/18/2020   Hyperlipidemia 12/18/2020   Mitral regurgitation 12/18/2020   Morbid obesity ([place]) 12/18/2020   Obstructive sleep apnea syndrome 12/18/2020   Vitamin B12 deficiency (non anemic)  12/18/2020   Other long term (current) drug therapy 12/18/2020   CAP (community acquired pneumonia) 12/07/2019   Cellulitis 02/26/2019   Essential hypertension 11/03/2017   Sepsis ([place]) 11/03/2017   DM2 (diabetes mellitus, type 2) ([place]) 11/03/2017   Cellulitis of right leg 11/03/2017   Flank pain 11/03/2017   Chronic diastolic CHF (congestive heart failure) ([place]) 03/19/2017   Diabetes mellitus ([place]) 03/19/2017   Obesity, diabetes, and hypertension syndrome ([place]) 03/19/2017   CKD (chronic kidney disease) stage 3, GFR 30-59 ml/min (HCC) 03/19/2017   Syncope 10/24/2016   Pulmonary hypertension ([place]) 12/07/2015   Varicose veins of leg with complications [id number]   Varicose veins of left lower extremity with complications [id number]   Varicose veins of lower extremities with other complications [id number]   Lipoma of back 07/19/2012   PCP:  [name], DO Pharmacy:   [facility] [address], [place] - [place] [address] [place] [address] Phone: [phone] Fax: [place], [place] - [address] AT [facility] [address] [place] [address] Phone: [phone] Fax: [phone]     Social Determinants of Health (SDOH) Social History: SDOH Screenings   Food Insecurity: No Food Insecurity (07/04/2022)  Housing: Low Risk  (07/04/2022)  Transportation Needs: No Transportation Needs (07/04/2022)  Utilities: Not At Risk (07/04/2022)  Tobacco Use: Low Risk  (06/30/2022)   SDOH Interventions:     Readmission Risk Interventions    07/06/2022    4:11 PM 05/07/2022    3:17 PM  Readmission Risk Prevention Plan  Transportation Screening Complete Complete  PCP or Specialist Appt within 3-5 Days  Complete  HRI or [place]  Complete  Social Work Consult for [place] Planning/Counseling  Complete  Palliative Care Screening  Not Applicable  Medication Review  Press photographer) Complete Complete  PCP or Specialist appointment within 3-5 days of discharge Complete   HRI or [place] Complete   SW Recovery Care/Counseling Consult Complete   Palliative Care Screening Not Applicable   Skilled Nursing Facility Complete

## 2022-07-06 NOTE — Progress Notes (Signed)
NAME:  [name], MRN:  [id number], DOB:  01-10-1931, LOS: 2 ADMISSION DATE:  07/21/2022, CONSULTATION DATE:  07/10/2022 REFERRING MD:  Annette Stable - NSGY , CHIEF COMPLAINT:  POD 0 T9-T10-T11decompressive lami & epidural abscess evac    History of Present Illness:  89 yo F PMH chronic hypoxia on 4L, diastolic HF, DM2 HTN, DVT on xarelto admitted to [facility] 3/9 with R chest discomfort, found to have sepsis from CAP. Start3ed on azithro rocephin. Had c/o severe back pain as well prompting imaging on  3/10 after Bcx returned + for MRSA, which revealed epidural abscess, cord compression, disc edema (detailed in MRI report)  NSGY consulted. Andexxa given. Transferred to [facility] for T9-T10-T11 lami, evac of abscess   PCCM is consulted post op     Pertinent  Medical History  COPD Chronic hypoxia Diastolic HF  DM2 DVT   Significant Hospital Events: Including procedures, antibiotic start and stop dates in addition to other pertinent events   3/9 admit to [facility]. CAP coverage. 3/10 Bcx with MRSA. MRI spine for back pain -- > NSGY consult for epidural abscess. T to [facility] for emergent T9 10 11 lami, abscess evac, extubated post-op   Interim History / Subjective:  Only complaints of back pain when moves, otherwise no complaints SBP requiring frequent prn's to keep SBP < 170 Urinary retention overnight   Objective   Blood pressure (!) 149/73, pulse (!) 112, temperature 98.3 F (36.8 C), temperature source Oral, resp. rate 15, height 5' 4.02" (1.626 m), weight 89.8 kg, SpO2 93 %.        Intake/Output Summary (Last 24 hours) at 07/06/2022 0736 Last data filed at 07/06/2022 0700 Gross per 24 hour  Intake 2004.66 ml  Output 900 ml  Net 1104.66 ml   Filed Weights   06/28/2022 1419 07/18/2022 1853 07/06/22 0500  Weight: 83.4 kg 87.9 kg 89.8 kg    Examination: General:  Elderly female sitting in bed in NAD HEENT: MM pink/moist, pupils 3/reactive, anicteric, HOH, poor detention, R IJ CVL Neuro: alert, oriented to  person, place, and month, MAE- generalized weakness CV: rr ir, > SR with frequent PACs, no murmur, left radial aline PULM:  non labored, congested np cough, clear and diminished in bases GI: soft, bs +, NT Extremities: warm/dry, +1-2 LE edema MSK> posterior spine drain not examined, wound vac noted, 111m output overnight  UOP 750 ml/ 24hrs +1.1L/ 24hrs Net +2.3L  Labs reviewed> Na 130, K 3.8, sCr. 0.94, Mag 1.7, alk phos 87> 144, WBC 26.3> 21.6, H/H stable   EKG> ST with first degree AV block with frequent PAC's  Resolved Hospital Problem list     Assessment & Plan:  Sepsis due to MRSA bacteremia Extensive thoracic/lumbar spinal abscess/discitis/osteomyelitis s/p T9, T10, T11 lami and abscess evac 06/30/2022 Bilateral lower lobe pneumonia - NSGY following, goal SBP < 170.  Continue Aline for now - frequent neuro checks - follow cultures  - auto ID consult pending - cont IV vanc and ceftriaxone> defer to ID  [facility] - echo ordered - continue gentle IV hydration till taking PO's better.  Monitor volume status closely - EKG to rule out ?Afib but just 1 degree AV block with PAC's> no significant change - multimodal pain management with scheduled bowel regimen - trend WBC/ fever curve   Chronic hypoxic/hypercapnic respiratory failure on home oxygen COPD not in exacerbation - continue baseline home O2- 4L [place] - cont pulmonary hygiene - cont BD, prn  duonebs   Chronic HFpEF - some LE edema, net +2.3L, monitor closely - MIVF till taking PO's better - echo pending  Sinus arrhythmia - ? Afib overnight but is just 1 degree AV block with frequent PACs.  - metoprolol 2.'5mg'$  x 1 - high risk for Afib, keep Mag > 2, mag 2gm today, K > 4  Hypertension - cont amlodipine and Imdur, - SBP goal < 170 per NSGY - cont to hold HCTZ given hyponatremia.  Consider different agent  Hyponatremia -  cont salt tablets - trend on BMET  DVT Patient is on Xarelto at home,  received reversal agent prior to going for back surgery - cont to hold [facility] pending NSGY recommendations to restart vs needing IVC filiter   Poorly controlled diabetes with hyperglycemia Patient's hemoglobin A1c was >10 in 04/2022 - CBG this am 98.  If doesn't eat good breakfast on on am dose of long acting, cont SSI prn - CBG goal 140 - 180  Nothing further to add.  TRH will continue as primary team, as discussed with Dr. Olevia Bowens PCCM will sign off.  Please call us back if we can be of any further assistance.  Best Practice (right click and "Reselect all SmartList Selections" daily)   Diet/type: Regular consistency (see orders)  DVT prophylaxis: SCD GI prophylaxis: PPI Lines: Central line and Arterial Line Foley:  N/A Code Status:  full code Last date of multidisciplinary goals of care discussion [Per primary team]  Labs   CBC: Recent Labs  Lab 06/29/22 2342 07/10/2022 2228 07/04/22 0500 07/06/2022 0525 07/17/2022 1947 07/06/22 0438  WBC 16.9* 27.1* 25.5* 23.6* 26.3* 21.6*  NEUTROABS 14.4* 25.3* 23.2*  --   --  18.9*  HGB 11.1* 11.6* 11.0* 10.7* 9.5* 9.7*  HCT 35.8* 35.5* 34.7* 34.0* 30.0* 29.9*  MCV 91.6 87.4 88.7 90.4 89.3 90.3  PLT PLATELET CLUMPS NOTED ON SMEAR, UNABLE TO ESTIMATE PLATELET CLUMPS NOTED ON SMEAR, UNABLE TO ESTIMATE PLATELET CLUMPS NOTED ON SMEAR, UNABLE TO ESTIMATE PLATELET CLUMPS NOTED ON SMEAR, UNABLE TO ESTIMATE PLATELET CLUMPS NOTED ON SMEAR, UNABLE TO ESTIMATE PLATELET CLUMPS NOTED ON SMEAR, UNABLE TO ESTIMATE    Basic Metabolic Panel: Recent Labs  Lab 06/29/22 2342 06/30/2022 2228 07/04/22 0500 07/03/2022 0525 07/23/2022 1947 07/06/22 0438  NA 133* 124* 122* 124* 127* 130*  K 4.6 4.3 3.8 3.8 3.7 3.8  CL 96* 87* 85* 87* 89* 90*  CO2 '28 27 25 25 22 23  '$ GLUCOSE 376* 292* 239* 206* 125* 91  BUN 39* 28* 25* '22 20 19  '$ CREATININE 1.02* 1.07* 1.00 0.86 0.98 0.94  CALCIUM 8.7* 8.2* 8.1* 8.3* 8.3* 8.4*  MG 1.5* 1.3* 1.3* 1.8  --  1.7  PHOS  --   --  3.5  --    --  3.5   GFR: Estimated Creatinine Clearance: 44 mL/min (by C-G formula based on SCr of 0.94 mg/dL). Recent Labs  Lab 07/22/2022 2228 07/04/22 0500 06/26/2022 0525 07/14/2022 1947 07/06/22 0438  PROCALCITON 0.74  --   --   --   --   WBC 27.1* 25.5* 23.6* 26.3* 21.6*  LATICACIDVEN  --  1.2  --   --   --     Liver Function Tests: Recent Labs  Lab 06/29/22 2342 07/12/2022 2228 07/04/22 0500 07/06/22 0438  AST 18 18 14* 24  ALT '12 23 20 24  '$ ALKPHOS 72 86 87 144*  BILITOT 0.5 1.5* 1.1 0.6  PROT 6.5 6.7 6.3* 5.6*  ALBUMIN 3.1*  2.9* 2.9* 2.0*   No results for input(s): "LIPASE", "AMYLASE" in the last 168 hours. No results for input(s): "AMMONIA" in the last 168 hours.  ABG    Component Value Date/Time   HCO3 30.4 (H) 06/22/2022 1411   TCO2 30 12/09/2015 0938   O2SAT 68.2 06/22/2022 1411     Coagulation Profile: No results for input(s): "INR", "PROTIME" in the last 168 hours.  Cardiac Enzymes: No results for input(s): "CKTOTAL", "CKMB", "CKMBINDEX", "TROPONINI" in the last 168 hours.  HbA1C: Hgb A1c MFr Bld  Date/Time Value Ref Range Status  05/03/2022 10:44 AM 10.3 (H) 4.8 - 5.6 % Final    Comment:    (NOTE)         Prediabetes: 5.7 - 6.4         Diabetes: >6.4         Glycemic control for adults with diabetes: <7.0   12/07/2019 03:16 AM 6.7 (H) 4.8 - 5.6 % Final    Comment:    (NOTE) Pre diabetes:          5.7%-6.4%  Diabetes:              >6.4%  Glycemic control for   <7.0% adults with diabetes     CBG: Recent Labs  Lab 07/22/2022 0816 06/27/2022 1206 07/06/2022 1415 07/03/2022 1730 07/04/2022 2107  GLUCAP 205* 268* 161* 81 139*     Critical care time: N/A      Kennieth Rad, MSN, AG-ACNP-BC [facility] 07/06/2022, 7:36 AM  See Amion for pager If no response to pager, please call PCCM consult pager After 7:00 pm call Elink

## 2022-07-06 NOTE — Progress Notes (Signed)
Occupational Therapy Treatment Patient Details Name: [name] MRN: [id number] DOB: 1930/07/30 Today's Date: 07/06/2022   History of present illness 89 yo female presenting 3/8 with abdominal pain. Work up revealed CAP. Pt transferred to [facility] on 3/10 after MRI showed "extensive epidural abscess extending from at least C7 / T1 caudally through the L1-2 with multifocal mod / severe compression of the spinal cord at T8-9, T10-11, T11-12" And is now s/p T9-11 laminectomy with evacuation of epidural abscess on 3/10. PMH includes: cellulitis of Left leg, CHF, COPD, DM II, glaucoma, HLD, and HTN.   OT comments  Pt currently mod +2 for toilet transfer to and from the [facility] with some difficulty following consistent one step commands to not use the LUE to assist with scooting and transfers secondary to A-line in wrist.  Mod +2 for supine to sit and sit to supine as well with no recall of her back precautions.  Feel pt will continue to benefit from acute care OT at this time to help progress ADL independence.  Of note during transfer back to supine from sitting, therapist assisted with lifting LEs back in the bed, causing a large skin tear on the right lateral calf region secondary to glove friction.  Nursing made aware as well as pt's son who was in the room just after it was dressed by nursing.  Safety zone to follow.    Recommendations for follow up therapy are one component of a multi-disciplinary discharge planning process, led by the attending physician.  Recommendations may be updated based on patient status, additional functional criteria and insurance authorization.    Follow Up Recommendations  Skilled nursing-short term rehab (<3 hours/day)     Assistance Recommended at Discharge Frequent or constant Supervision/Assistance  Patient can return home with the following  A little help with walking and/or transfers;A lot of help with bathing/dressing/bathroom;Assistance with cooking/housework;Direct  supervision/assist for medications management;Direct supervision/assist for financial management;Help with stairs or ramp for entrance;Assist for transportation   Equipment Recommendations  Other (comment) (TBD next venue of care)       Precautions / Restrictions Precautions Precautions: Fall;Back Precaution Comments: a-line LUE, fall Restrictions Weight Bearing Restrictions: No       Mobility Bed Mobility Overal bed mobility: Needs Assistance   Rolling: Min assist Sidelying to sit: Mod assist, +2 for physical assistance, HOB elevated   Sit to supine: +2 for physical assistance, Mod assist   General bed mobility comments: Mod demonstrational cueing for bringing LEs off of the bed after rolling with mod assist for lifting trunk up to sitting.  Max assist for transfer back down to supine for controlling trunk and assisting with lifting LEs up into the bed.    Transfers Overall transfer level: Needs assistance Equipment used: 2 person hand held assist Transfers: Sit to/from Stand, Bed to chair/wheelchair/BSC Sit to Stand: Mod assist, +2 physical assistance, +2 safety/equipment     Step pivot transfers: Mod assist, +2 physical assistance     General transfer comment: Pt needed mod demonstrational cueing to initiate sit to stand.     Balance Overall balance assessment: Needs assistance Sitting-balance support: Feet supported, Bilateral upper extremity supported Sitting balance-Leahy Scale: Poor Sitting balance - Comments: Posterior LOB   Standing balance support: Bilateral upper extremity supported, During functional activity Standing balance-Leahy Scale: Poor Standing balance comment: Mod +2 for standing balance  ADL either performed or assessed with clinical judgement   ADL Overall ADL's : Needs assistance/impaired     Grooming: Sitting;Minimal assistance Grooming Details (indicate cue type and reason): simulated, pt with  posterior lean and LOB     Lower Body Bathing: Moderate assistance;+2 for safety/equipment;Sit to/from stand Lower Body Bathing Details (indicate cue type and reason): simulated         Toilet Transfer: Moderate assistance;+2 for safety/equipment;Cueing for sequencing;Stand-pivot;BSC/3in1   Toileting- Clothing Manipulation and Hygiene: Maximal assistance;Sit to/from stand       Functional mobility during ADLs: Moderate assistance;+2 for safety/equipment (transfers and side stepping up toward the EOB without use of an assistive device.) General ADL Comments: Pt severely hard of hearing, needed max instructional cueing for not using the LUE with A-line in place.  Pt continually trying to push up from the bed for scooting and sit to stand.  Completed attempted toileting on the [facility] next to bed with mod + 2 for transfer step pivot.  Returned back to the EOB with mod +2 for sit to supine.  When doing so, one therapist assisted with bringing supporting trunk down to bed and this therapist assisted with bringing LEs up into the bed.  Once in supine it was noted that pt had large skin tear on the lower right LE where therapist was helping to assist LEs into the bed from friction of gloves.  Nursing made aware and applied dressing as well as pt's son.    Extremity/Trunk Assessment Upper Extremity Assessment Upper Extremity Assessment: Generalized weakness (Not formally assessed secondary to back precautions, but pt able to move them both spontaneously with assist with transitional movements.)   Lower Extremity Assessment Lower Extremity Assessment: Defer to PT evaluation   Cervical / Trunk Assessment Cervical / Trunk Assessment: Kyphotic;Back Surgery Cervical / Trunk Exceptions: large body habitus    Vision Patient Visual Report: No change from baseline            Cognition Arousal/Alertness: Awake/alert Behavior During Therapy: Flat affect Overall Cognitive Status: Difficult to assess  (Pt severely HOH) Area of Impairment: Following commands                       Following Commands: Follows one step commands inconsistently, Follows multi-step commands inconsistently       General Comments: pt very HOH, able to answer some questions posed by therapists, son Edd Arbour present and assisting with background information. Pt needing max demonstrational cueing for safety to not try and push up from the bed with the LUE and Arterial line in place.  Per son pt is close to baseline.              General Comments fragile skin, skin tear occured on calf of RLE during session while assisting pt's legs back into bed. 6L O2 used in session with SpO2 >92%. HR max 142bpm    Pertinent Vitals/ Pain       Pain Assessment Pain Assessment: Faces Faces Pain Scale: Hurts a little bit Pain Location: back Pain Descriptors / Indicators: Discomfort Pain Intervention(s): Limited activity within patient's tolerance  Home Living Family/patient expects to be discharged to:: Private residence Living Arrangements: Spouse/significant other;Children (spouse is unable to physically assist. daughter is caretaker) Available Help at Discharge: Family;Available PRN/intermittently (daughter in morning and night, checks on them during lunch) Type of Home: House Home Access: Ramped entrance     Home Layout: One level     Bathroom Shower/Tub: Tub/shower  unit   Bathroom Toilet: Handicapped height     Home Equipment: [place] - single point;Rollator (4 wheels) (possible shower seat)              Frequency  Min 2X/week        Progress Toward Goals  OT Goals(current goals can now be found in the care plan section)  Progress towards OT goals: Progressing toward goals  Acute Rehab OT Goals Patient Stated Goal: Pt did not state Time For Goal Achievement:  Potential to Achieve Goals: [place] Discharge plan remains appropriate    Co-evaluation      Reason for  Co-Treatment: For patient/therapist safety;Complexity of the patient's impairments (multi-system involvement);To address functional/ADL transfers PT goals addressed during session: Balance;Mobility/safety with mobility;Strengthening/ROM        AM-PAC OT "6 Clicks" Daily Activity     Outcome Measure   Help from another person eating meals?: None Help from another person taking care of personal grooming?: A Little Help from another person toileting, which includes using toliet, bedpan, or urinal?: A Lot Help from another person bathing (including washing, rinsing, drying)?: A Lot Help from another person to put on and taking off regular upper body clothing?: A Little Help from another person to put on and taking off regular lower body clothing?: A Lot 6 Click Score: 16    End of Session Equipment Utilized During Treatment: Oxygen  OT Visit Diagnosis: Muscle weakness (generalized) (M62.81);Pain;Unsteadiness on feet (R26.81) Pain - part of body:  (back)   Activity Tolerance Patient tolerated treatment well   Patient Left with call bell/phone within reach;in bed;with nursing/sitter in room   Nurse Communication Mobility status;Other (comment) (skin tear on the RLE)        Time: [id number] OT Time Calculation (min): 43 min  Charges: OT General Charges $OT Visit: 1 Visit OT Treatments $Self Care/Home Management : 8-22 mins  Clyda Greener, OTR/L [place]  Office [phone] 07/06/2022

## 2022-07-07 ENCOUNTER — Inpatient Hospital Stay (HOSPITAL_COMMUNITY): Payer: Medicare Other

## 2022-07-07 DIAGNOSIS — M4620 Osteomyelitis of vertebra, site unspecified: Secondary | ICD-10-CM | POA: Diagnosis not present

## 2022-07-07 DIAGNOSIS — G061 Intraspinal abscess and granuloma: Secondary | ICD-10-CM | POA: Diagnosis not present

## 2022-07-07 DIAGNOSIS — J189 Pneumonia, unspecified organism: Secondary | ICD-10-CM | POA: Diagnosis not present

## 2022-07-07 DIAGNOSIS — E871 Hypo-osmolality and hyponatremia: Secondary | ICD-10-CM | POA: Diagnosis not present

## 2022-07-07 DIAGNOSIS — G062 Extradural and subdural abscess, unspecified: Secondary | ICD-10-CM | POA: Diagnosis not present

## 2022-07-07 DIAGNOSIS — R7881 Bacteremia: Secondary | ICD-10-CM | POA: Diagnosis not present

## 2022-07-07 LAB — BASIC METABOLIC PANEL
Anion gap: 8 (ref 5–15)
BUN: 19 mg/dL (ref 8–23)
CO2: 25 mmol/L (ref 22–32)
Calcium: 8.1 mg/dL — ABNORMAL LOW (ref 8.9–10.3)
Chloride: 96 mmol/L — ABNORMAL LOW (ref 98–111)
Creatinine, Ser: 0.94 mg/dL (ref 0.44–1.00)
GFR, Estimated: 58 mL/min — ABNORMAL LOW (ref >60)
Glucose, Bld: 58 mg/dL — ABNORMAL LOW (ref 70–99)
Potassium: 4.7 mmol/L (ref 3.5–5.1)
Sodium: 129 mmol/L — ABNORMAL LOW (ref 135–145)

## 2022-07-07 LAB — GLUCOSE, CAPILLARY
Glucose-Capillary: 116 mg/dL — ABNORMAL HIGH (ref 70–99)
Glucose-Capillary: 89 mg/dL (ref 70–99)
Glucose-Capillary: 74 mg/dL (ref 70–99)
Glucose-Capillary: 99 mg/dL (ref 70–99)
Glucose-Capillary: 148 mg/dL — ABNORMAL HIGH (ref 70–99)
Glucose-Capillary: 190 mg/dL — ABNORMAL HIGH (ref 70–99)

## 2022-07-07 LAB — MAGNESIUM: Magnesium: 2 mg/dL (ref 1.7–2.4)

## 2022-07-07 MED ORDER — DEXTROSE 50 % IV SOLN
INTRAVENOUS | Status: AC
  Administered 2022-07-07: 50 mL via INTRAVENOUS
  Filled 2022-07-07: qty 50

## 2022-07-07 MED ORDER — DEXTROSE 50 % IV SOLN: 12.5000 g | INTRAVENOUS | Status: AC

## 2022-07-07 MED ORDER — SODIUM CHLORIDE 0.9 % IV SOLN: INTRAVENOUS | Status: AC

## 2022-07-07 MED ORDER — INSULIN GLARGINE-YFGN 100 UNIT/ML ~~LOC~~ SOLN
6.0000 [IU] | Freq: Two times a day (BID) | SUBCUTANEOUS | Status: DC
  Administered 2022-07-07 – 2022-07-08 (×3): 6 [IU] via SUBCUTANEOUS
  Filled 2022-07-07 (×6): qty 0.06

## 2022-07-07 NOTE — Progress Notes (Signed)
Noted patient O2 saturations dropping frequently into low 80's despite being on 6L O2 and sats not recovering above 88%. Patient not in distress, able to speak in full sentences and sitting up straight in bed. Placed NRB on patient and notified Dr. Olevia Bowens with orders for stat chest xray.

## 2022-07-07 NOTE — Progress Notes (Signed)
@   0030 this patient was screaming out and saying "you're killing me" over and over. She was unable to be consoled. She also stated her whole body hurt, I had given her IV pain meds prior, then also gave her PO pain meds. I had to also give her a dose of haldol to get her to calm.

## 2022-07-07 NOTE — Progress Notes (Signed)
[facility] PROGRESS NOTE    Progress Note  [name]  [id number] DOB: July 21, 1930 DOA: 07/11/2022 PCP: [name], DO     Brief Narrative:   [name] is an 89 y.o. female past medical history of chronic hypoxic respiratory failure on 4 L of oxygen, chronic diastolic heart failure, diabetes mellitus type 2 DVT on Xarelto admitted to [facility] on 07/04/2022 for right-sided chest discomfort found to be septic due to pneumonia started on Rocephin and azithromycin, on 07/25/2022 blood cultures were positive for MRSA, imaging showed an epidural abscess with cord compression disc edema, neurosurgery consulted transferred to [facility] for I&D of T9-T10 and T11 surgical intervention with laminectomy.  Significant events: 3/9 admit to [facility]. CAP coverage. 3/10 Bcx with MRSA. MRI spine for back pain -- > NSGY consult for epidural abscess. T to [facility] for emergent T9 10 11 lami, abscess evac  Assessment/Plan:   Sepsis due to MRSA bacteremia and extensive abscess of the thoracic lumbar spine specifically T9, T10-T11 status post laminectomy and I&D/bilateral lower lobe pneumonia: Blood cultures grew MRSA 07/18/2022, surveillance blood cultures on 07/06/2022 remain negative till date. MRI of the spine concern abscess underwent I&D and laminectomy by neurosurgery on 07/03/2022. 2D echo EF of 70% no wall motion abnormality, mild aortic stenosis, will need a TEE.  Consult ID. Continue narcotics for pain control.  Started on a bowel regimen.  Acute confusional state: She was started on Haldol and melatonin. This morning she is more quiet and sleepy.  Chronic respiratory failure with hypoxia and hypercarbia/COPD not in exacerbation: Currently on 4 L of oxygen in no acute distress continue inhalers. Try to wean down if possible.  Chronic diastolic heart failure: Patient appears dry continue strict I's and O's currently on IV fluids will be gentle with hydration. Continue IV  fluids.  Essential hypertension: Hold hydrochlorothiazide continue amlodipine and Imdur. Blood pressure is stable continue to monitor closely.  Hypovolemic hyponatremia: Continues to be hyponatremic continue IV fluids for an additional 24 hours.  History of DVT: Patient is on Xarelto at home is currently being held due to recent surgical intervention. Neurosurgery to dictate when to start anticoagulation.  Poorly controlled diabetes mellitus type 2 with hyperglycemia: A1c greater than 10 in January of this year. Had an episode of 58 this morning decrease long-acting insulin continue sliding scale.  DVT prophylaxis: scd Family Communication:none Status is: Inpatient Remains inpatient appropriate because: Bacteremic    Code Status:     Code Status Orders  (From admission, onward)           Start     Ordered   07/04/22 0215  Full code  Continuous       Question:  By:  Answer:  Consent: discussion documented in EHR   07/04/22 0215           Code Status History     Date Active Date Inactive Code Status Order ID Comments User Context   06/22/2022 1713 06/26/2022 1547 Full Code [id number]  [name], MD ED   05/13/2022 2258 05/22/2022 1846 Full Code [id number]  [name], MD ED   05/03/2022 1320 05/07/2022 2011 Full Code [id number]  [name], DO ED   12/07/2019 0617 12/09/2019 1943 Full Code [id number]  [name], MD Inpatient   02/26/2019 1741 02/28/2019 1720 Full Code [id number]  [name], MD Inpatient   02/26/2019 1642 02/26/2019 1700 Full Code [id number]  [name], MD ED   11/03/2017 [phone] 11/04/2017 1535  Partial Code [id number]  Ivor Costa, MD ED   03/19/2017 1756 03/21/2017 1407 Full Code [id number]  [name], MD Inpatient   10/24/2016 1430 10/27/2016 1604 Full Code [id number]  [name], MD ED   07/20/2016 1027 07/21/2016 0325 Full Code [id number]  [name], MD HOV         IV Access:   Peripheral IV   Procedures and  diagnostic studies:   ECHOCARDIOGRAM COMPLETE  Result Date: 07/06/2022    ECHOCARDIOGRAM REPORT   Patient Name:   [name] Date of Exam: 07/06/2022 Medical Rec #:  [id number]       Height:       64.0 in Accession #:    [id number]      Weight:       198.0 lb Date of Birth:  09-May-1930       BSA:          1.948 m Patient Age:    52 years        BP:           115/34 mmHg Patient Gender: F               HR:           88 bpm. Exam Location:  Inpatient Procedure: 2D Echo, Cardiac Doppler, Color Doppler and Intracardiac            Opacification Agent Indications:    Bacteremia  History:        Patient has prior history of Echocardiogram examinations, most                 recent 10/19/2019. CHF, COPD; Risk Factors:Hypertension,                 Dyslipidemia and Diabetes.  Sonographer:    Eartha Inch Referring Phys: [id number] CHAND  Sonographer Comments: Technically difficult study due to poor echo windows. Image acquisition challenging due to patient body habitus and Image acquisition challenging due to respiratory motion. IMPRESSIONS  1. Left ventricular ejection fraction, by estimation, is 70 to 75%. The left ventricle has hyperdynamic function. The left ventricle has no regional wall motion abnormalities. There is mild asymmetric left ventricular hypertrophy of the basal-septal segment. Left ventricular diastolic parameters are indeterminate.  2. Right ventricular systolic function is normal. The right ventricular size is normal.  3. Left atrial size was mild to moderately dilated.  4. Right atrial size was mild to moderately dilated.  5. The mitral valve is normal in structure. No evidence of mitral valve regurgitation. No evidence of mitral stenosis.  6. The aortic valve is tricuspid. There is moderate calcification of the aortic valve. Aortic valve regurgitation is trivial. Mild aortic valve stenosis. Aortic valve area, by VTI measures 1.43 cm. Aortic valve mean gradient measures 17.0 mmHg. Aortic  valve Vmax measures 2.73 m/s.  7. The inferior vena cava is normal in size with greater than 50% respiratory variability, suggesting right atrial pressure of 3 mmHg.  8. No TTE evidence of endocarditis. If clinical suspicion is high would recommend TEE. FINDINGS  Left Ventricle: Left ventricular ejection fraction, by estimation, is 70 to 75%. The left ventricle has hyperdynamic function. The left ventricle has no regional wall motion abnormalities. Definity contrast agent was given IV to delineate the left ventricular endocardial borders. The left ventricular internal cavity size was normal in size. There is mild asymmetric left ventricular hypertrophy of the basal-septal segment. Left ventricular diastolic function could not be evaluated  due to atrial fibrillation. Left ventricular diastolic parameters are indeterminate. Right Ventricle: The right ventricular size is normal. No increase in right ventricular wall thickness. Right ventricular systolic function is normal. Left Atrium: Left atrial size was mild to moderately dilated. Right Atrium: Right atrial size was mild to moderately dilated. Pericardium: There is no evidence of pericardial effusion. Mitral Valve: The mitral valve is normal in structure. Mild mitral annular calcification. No evidence of mitral valve regurgitation. No evidence of mitral valve stenosis. Tricuspid Valve: The tricuspid valve is normal in structure. Tricuspid valve regurgitation is trivial. No evidence of tricuspid stenosis. Aortic Valve: The aortic valve is tricuspid. There is moderate calcification of the aortic valve. Aortic valve regurgitation is trivial. Mild aortic stenosis is present. Aortic valve mean gradient measures 17.0 mmHg. Aortic valve peak gradient measures 29.8 mmHg. Aortic valve area, by VTI measures 1.43 cm. Pulmonic Valve: The pulmonic valve was normal in structure. Pulmonic valve regurgitation is trivial. No evidence of pulmonic stenosis. Aorta: The aortic root is  normal in size and structure. Venous: The inferior vena cava is normal in size with greater than 50% respiratory variability, suggesting right atrial pressure of 3 mmHg. IAS/Shunts: No atrial level shunt detected by color flow Doppler.  LEFT VENTRICLE PLAX 2D LVIDd:         3.90 cm     Diastology LVIDs:         1.80 cm     LV e' medial:    10.00 cm/s LV PW:         1.20 cm     LV E/e' medial:  11.1 LV IVS:        1.20 cm     LV e' lateral:   7.07 cm/s LVOT diam:     1.80 cm     LV E/e' lateral: 15.7 LV SV:         68 LV SV Index:   35 LVOT Area:     2.54 cm  LV Volumes (MOD) LV vol d, MOD A2C: 69.4 ml LV vol d, MOD A4C: 63.9 ml LV vol s, MOD A2C: 11.1 ml LV vol s, MOD A4C: 6.7 ml LV SV MOD A2C:     58.3 ml LV SV MOD A4C:     63.9 ml LV SV MOD BP:      58.3 ml RIGHT VENTRICLE             IVC RV S prime:     13.20 cm/s  IVC diam: 1.30 cm TAPSE (M-mode): 1.8 cm LEFT ATRIUM             Index        RIGHT ATRIUM           Index LA diam:        2.90 cm 1.49 cm/m   RA Area:     18.00 cm LA Vol (A2C):   52.4 ml 26.90 ml/m  RA Volume:   50.00 ml  25.67 ml/m LA Vol (A4C):   59.5 ml 30.55 ml/m LA Biplane Vol: 56.9 ml 29.21 ml/m  AORTIC VALVE AV Area (Vmax):    1.15 cm AV Area (Vmean):   1.19 cm AV Area (VTI):     1.43 cm AV Vmax:           273.00 cm/s AV Vmean:          197.000 cm/s AV VTI:            0.474 m AV Peak  Grad:      29.8 mmHg AV Mean Grad:      17.0 mmHg LVOT Vmax:         123.00 cm/s LVOT Vmean:        92.500 cm/s LVOT VTI:          0.266 m LVOT/AV VTI ratio: 0.56  AORTA Ao Root diam: 2.80 cm Ao Asc diam:  3.00 cm MITRAL VALVE MV Area (PHT): 3.37 cm     SHUNTS MV Decel Time: 225 msec     Systemic VTI:  0.27 m MV E velocity: 111.00 cm/s  Systemic Diam: 1.80 cm MV A velocity: 99.70 cm/s MV E/A ratio:  1.11 Glori Bickers MD Electronically signed by Glori Bickers MD Signature Date/Time: 07/06/2022/3:44:20 PM    Final    DG Thoracic Spine 1 View  Result Date: 07/18/2022 CLINICAL DATA:  [id number] with  elective surgery performed for extensive epidural abscess from C7-T1 through L1-2 with spinal cord compression. EXAM: OPERATIVE THORACIC SPINE 1 VIEW COMPARISON:  Thoracic and lumbar spine MRI yesterday. FINDINGS: Single lateral view is obtained from the mid T10 level through the sacrum. A metallic surgical instrument noted dorsal to T11. Trace discogenic retrolisthesis is again seen at L1-2 with otherwise unremarkable lateral alignment. There is moderate thoracolumbar spondylosis. Ankylosis across the L5-S1 disc is again shown. The discs show degenerative collapse from T11 down, as before. No endplate destructive changes are seen. All imaged vertebrae are normal in heights. There have been no significant interval changes radiographically. IMPRESSION: 1. Metallic surgical instrument dorsal to T11. No significant interval changes radiographically. 2. Thoracolumbar spondylosis and multilevel DDD without evidence of compression fractures or visible endplate destructive change. 3. Ankylosis at L5-S1. Electronically Signed   By: Telford Nab M.D.   On: 07/18/2022 21:08   DG Chest Port 1 View  Result Date: 07/18/2022 CLINICAL DATA:  Shortness of breath EXAM: PORTABLE CHEST 1 VIEW COMPARISON:  Chest radiograph 07/04/2022 FINDINGS: Central venous catheter tip projects over the superior vena cava. Stable cardiac and mediastinal contours. Aortic atherosclerosis. New heterogeneous opacities right mid lung. Bilateral interstitial pulmonary opacities. No pleural effusion or pneumothorax. Thoracic spine degenerative changes. IMPRESSION: New heterogeneous opacities right mid lung may represent atelectasis or infection. Recommend short-term follow-up chest radiograph to ensure resolution and exclude underlying nodule. Interstitial opacities may represent vascular redistribution. Superimposed mild edema not excluded. Electronically Signed   By: Lovey Newcomer M.D.   On: 07/01/2022 16:08   MR THORACIC SPINE W WO  CONTRAST  Result Date: 06/27/2022 CLINICAL DATA:  Low back pain, infection suspected. Intermittent back pain with MRSA bacteremia. EXAM: MRI THORACIC AND LUMBAR SPINE WITHOUT AND WITH CONTRAST TECHNIQUE: Multiplanar and multiecho pulse sequences of the thoracic and lumbar spine were obtained without and with intravenous contrast. CONTRAST:  76m GADAVIST GADOBUTROL 1 MMOL/ML IV SOLN COMPARISON:  CTA chest 06/30/2022.  CT abdomen/pelvis 07/04/2022. FINDINGS: MRI THORACIC SPINE FINDINGS Alignment:  Normal. Vertebrae: Disc edema with adjacent endplate marrow edema and enhancement at T6-7, T8-9, T10-11 and T11-12. However, there is little correlate on the recent CTA chest. Cord: Longitudinally extensive epidural abscess extending from at least C7-T1 caudally through the upper lumbar spine. Associated areas of leptomeningeal enhancement, consistent with superimposed arachnoiditis. Based on the localizer images, suspect extension into the cervical spinal canal as well. Paraspinal and other soft tissues: Atelectasis or consolidation in dependent portions of the lungs in dependent portions of the lungs. Disc levels: Multifocal moderate to severe compression of the spinal cord at T8-9,  T10-11, T11-12. MRI LUMBAR SPINE FINDINGS Segmentation:  Standard. Alignment:  Normal. Vertebrae: Modic type 2 degenerative endplate marrow signal changes at L1-2, L2-3 and L4-5. No evidence of discitis-osteomyelitis in the lumbar spine. Conus medullaris: Extends to the L1-2 level. Clumping of the cauda equina nerve roots and conus with associated leptomeningeal enhancement, consistent with arachnoiditis. Paraspinal and other soft tissues: No acute findings. Disc levels: Mild spinal canal stenosis at L1-2 secondary to facet arthropathy and central disc-osteophyte complex. IMPRESSION: 1. Longitudinally extensive epidural abscess extending from at least C7-T1 caudally through the L1-2. Based on the localizer images, suspect involvement of the  cervical spinal canal as well. 2. Multifocal moderate to severe compression of the spinal cord at T8-9, T10-11, T11-12. 3. Disc edema with adjacent endplate marrow edema and enhancement at T6-7, T8-9, T10-11 and T11-12. Findings are suspicious for multifocal discitis-osteomyelitis, though there is little correlate on the recent CTA chest. 4. Clumping of the cauda equina nerve roots and conus with associated leptomeningeal enhancement, consistent with superimposed arachnoiditis. Critical Value/emergent results were called by telephone at the time of interpretation on 07/18/2022 at 12:16 pm to provider [facility] , who verbally acknowledged these results. Electronically Signed   By: Emmit Alexanders M.D.   On: 06/29/2022 12:25   MR Lumbar Spine W Wo Contrast  Result Date: 06/28/2022 CLINICAL DATA:  Low back pain, infection suspected. Intermittent back pain with MRSA bacteremia. EXAM: MRI THORACIC AND LUMBAR SPINE WITHOUT AND WITH CONTRAST TECHNIQUE: Multiplanar and multiecho pulse sequences of the thoracic and lumbar spine were obtained without and with intravenous contrast. CONTRAST:  73m GADAVIST GADOBUTROL 1 MMOL/ML IV SOLN COMPARISON:  CTA chest 06/30/2022.  CT abdomen/pelvis 07/04/2022. FINDINGS: MRI THORACIC SPINE FINDINGS Alignment:  Normal. Vertebrae: Disc edema with adjacent endplate marrow edema and enhancement at T6-7, T8-9, T10-11 and T11-12. However, there is little correlate on the recent CTA chest. Cord: Longitudinally extensive epidural abscess extending from at least C7-T1 caudally through the upper lumbar spine. Associated areas of leptomeningeal enhancement, consistent with superimposed arachnoiditis. Based on the localizer images, suspect extension into the cervical spinal canal as well. Paraspinal and other soft tissues: Atelectasis or consolidation in dependent portions of the lungs in dependent portions of the lungs. Disc levels: Multifocal moderate to severe compression of the spinal cord  at T8-9, T10-11, T11-12. MRI LUMBAR SPINE FINDINGS Segmentation:  Standard. Alignment:  Normal. Vertebrae: Modic type 2 degenerative endplate marrow signal changes at L1-2, L2-3 and L4-5. No evidence of discitis-osteomyelitis in the lumbar spine. Conus medullaris: Extends to the L1-2 level. Clumping of the cauda equina nerve roots and conus with associated leptomeningeal enhancement, consistent with arachnoiditis. Paraspinal and other soft tissues: No acute findings. Disc levels: Mild spinal canal stenosis at L1-2 secondary to facet arthropathy and central disc-osteophyte complex. IMPRESSION: 1. Longitudinally extensive epidural abscess extending from at least C7-T1 caudally through the L1-2. Based on the localizer images, suspect involvement of the cervical spinal canal as well. 2. Multifocal moderate to severe compression of the spinal cord at T8-9, T10-11, T11-12. 3. Disc edema with adjacent endplate marrow edema and enhancement at T6-7, T8-9, T10-11 and T11-12. Findings are suspicious for multifocal discitis-osteomyelitis, though there is little correlate on the recent CTA chest. 4. Clumping of the cauda equina nerve roots and conus with associated leptomeningeal enhancement, consistent with superimposed arachnoiditis. Critical Value/emergent results were called by telephone at the time of interpretation on 06/30/2022 at 12:16 pm to provider [facility], who verbally acknowledged these results. Electronically Signed  By: Emmit Alexanders M.D.   On: 07/25/2022 12:25     Medical Consultants:   None.   Subjective:    Everlene Farrier B Nyman sleepy the morning.  Objective:    Vitals:   07/07/22 0000 07/07/22 0100 07/07/22 0400 07/07/22 0406  BP: (!) 132/58 (!) 120/55 (!) 115/53   Pulse: (!) 110 (!) 101 69   Resp: '17 15 15   '$ Temp: 98.2 F (36.8 C)  98.3 F (36.8 C)   TempSrc: Axillary  Axillary   SpO2: 95% 100% 100%   Weight:    87.3 kg  Height:       SpO2: 100 % O2 Flow Rate (L/min): 4  L/min   Intake/Output Summary (Last 24 hours) at 07/07/2022 0737 Last data filed at 07/07/2022 0700 Gross per 24 hour  Intake 1670.3 ml  Output 315 ml  Net 1355.3 ml    Filed Weights   07/21/2022 1853 07/06/22 0500 07/07/22 0406  Weight: 87.9 kg 89.8 kg 87.3 kg    Exam: General exam: In no acute distress. Respiratory system: Good air movement and clear to auscultation. Cardiovascular system: S1 & S2 heard, RRR. No JVD. Gastrointestinal system: Abdomen is nondistended, soft and nontender.  Extremities: No pedal edema. Skin: No rashes, lesions or ulcers Psychiatry: No judgment or insight of medical condition.   Data Reviewed:    Labs: Basic Metabolic Panel: Recent Labs  Lab 07/12/2022 2228 07/04/22 0500 07/09/2022 0525 07/08/2022 1947 07/06/22 0438 07/07/22 0358  NA 124* 122* 124* 127* 130* 129*  K 4.3 3.8 3.8 3.7 3.8 4.7  CL 87* 85* 87* 89* 90* 96*  CO2 '27 25 25 22 23 25  '$ GLUCOSE 292* 239* 206* 125* 91 58*  BUN 28* 25* '22 20 19 19  '$ CREATININE 1.07* 1.00 0.86 0.98 0.94 0.94  CALCIUM 8.2* 8.1* 8.3* 8.3* 8.4* 8.1*  MG 1.3* 1.3* 1.8  --  1.7 2.0  PHOS  --  3.5  --   --  3.5  --     GFR Estimated Creatinine Clearance: 43.4 mL/min (by C-G formula based on SCr of 0.94 mg/dL). Liver Function Tests: Recent Labs  Lab 07/23/2022 2228 07/04/22 0500 07/06/22 0438  AST 18 14* 24  ALT '23 20 24  '$ ALKPHOS 86 87 144*  BILITOT 1.5* 1.1 0.6  PROT 6.7 6.3* 5.6*  ALBUMIN 2.9* 2.9* 2.0*    No results for input(s): "LIPASE", "AMYLASE" in the last 168 hours. No results for input(s): "AMMONIA" in the last 168 hours. Coagulation profile No results for input(s): "INR", "PROTIME" in the last 168 hours. COVID-19 Labs  No results for input(s): "DDIMER", "FERRITIN", "LDH", "CRP" in the last 72 hours.  Lab Results  Component Value Date   SARSCOV2NAA NEGATIVE 07/04/2022   SARSCOV2NAA NEGATIVE 06/22/2022   SARSCOV2NAA POSITIVE (A) 05/03/2022   [place] NEGATIVE 12/06/2019     CBC: Recent Labs  Lab 06/28/2022 2228 07/04/22 0500 07/22/2022 0525 07/09/2022 1947 07/06/22 0438  WBC 27.1* 25.5* 23.6* 26.3* 21.6*  NEUTROABS 25.3* 23.2*  --   --  18.9*  HGB 11.6* 11.0* 10.7* 9.5* 9.7*  HCT 35.5* 34.7* 34.0* 30.0* 29.9*  MCV 87.4 88.7 90.4 89.3 90.3  PLT PLATELET CLUMPS NOTED ON SMEAR, UNABLE TO ESTIMATE PLATELET CLUMPS NOTED ON SMEAR, UNABLE TO ESTIMATE PLATELET CLUMPS NOTED ON SMEAR, UNABLE TO ESTIMATE PLATELET CLUMPS NOTED ON SMEAR, UNABLE TO ESTIMATE PLATELET CLUMPS NOTED ON SMEAR, UNABLE TO ESTIMATE    Cardiac Enzymes: No results for input(s): "CKTOTAL", "CKMB", "CKMBINDEX", "TROPONINI" in  the last 168 hours. BNP (last 3 results) No results for input(s): "PROBNP" in the last 8760 hours. CBG: Recent Labs  Lab 07/06/22 0802 07/06/22 1210 07/06/22 1705 07/06/22 2144 07/07/22 0614  GLUCAP 119* 173* 130* 100* 116*    D-Dimer: No results for input(s): "DDIMER" in the last 72 hours. Hgb A1c: No results for input(s): "HGBA1C" in the last 72 hours. Lipid Profile: No results for input(s): "CHOL", "HDL", "LDLCALC", "TRIG", "CHOLHDL", "LDLDIRECT" in the last 72 hours. Thyroid function studies: No results for input(s): "TSH", "T4TOTAL", "T3FREE", "THYROIDAB" in the last 72 hours.  Invalid input(s): "FREET3"  Anemia work up: No results for input(s): "VITAMINB12", "FOLATE", "FERRITIN", "TIBC", "IRON", "RETICCTPCT" in the last 72 hours. Sepsis Labs: Recent Labs  Lab 07/10/2022 2228 07/04/22 0500 07/10/2022 0525 07/01/2022 1947 07/06/22 0438  PROCALCITON 0.74  --   --   --   --   WBC 27.1* 25.5* 23.6* 26.3* 21.6*  LATICACIDVEN  --  1.2  --   --   --     Microbiology Recent Results (from the past 240 hour(s))  Resp panel by RT-PCR (RSV, Flu A&B, Covid)     Status: None   Collection Time: 07/04/22  1:23 AM  Result Value Ref Range Status   SARS Coronavirus 2 by RT PCR NEGATIVE NEGATIVE Final    Comment: (NOTE) SARS-CoV-2 target nucleic acids are NOT  DETECTED.  The SARS-CoV-2 RNA is generally detectable in upper respiratory specimens during the acute phase of infection. The lowest concentration of SARS-CoV-2 viral copies this assay can detect is 138 copies/mL. A negative result does not preclude SARS-Cov-2 infection and should not be used as the sole basis for treatment or other patient management decisions. A negative result may occur with  improper specimen collection/handling, submission of specimen other than nasopharyngeal swab, presence of viral mutation(s) within the areas targeted by this assay, and inadequate number of viral copies(<138 copies/mL). A negative result must be combined with clinical observations, patient history, and epidemiological information. The expected result is Negative.  Fact Sheet for Patients:  EntrepreneurPulse.com.au  Fact Sheet for Healthcare Providers:  IncredibleEmployment.be  This test is no t yet approved or cleared by the [place] FDA and  has been authorized for detection and/or diagnosis of SARS-CoV-2 by FDA under an Emergency Use Authorization (EUA). This EUA will remain  in effect (meaning this test can be used) for the duration of the COVID-19 declaration under Section 564(b)(1) of the Act, 21 U.S.C.section 360bbb-3(b)(1), unless the authorization is terminated  or revoked sooner.       Influenza A by PCR NEGATIVE NEGATIVE Final   Influenza B by PCR NEGATIVE NEGATIVE Final    Comment: (NOTE) The Xpert Xpress SARS-CoV-2/FLU/RSV plus assay is intended as an aid in the diagnosis of influenza from Nasopharyngeal swab specimens and should not be used as a sole basis for treatment. Nasal washings and aspirates are unacceptable for Xpert Xpress SARS-CoV-2/FLU/RSV testing.  Fact Sheet for Patients: EntrepreneurPulse.com.au  Fact Sheet for Healthcare Providers: IncredibleEmployment.be  This test is not yet  approved or cleared by the [place] FDA and has been authorized for detection and/or diagnosis of SARS-CoV-2 by FDA under an Emergency Use Authorization (EUA). This EUA will remain in effect (meaning this test can be used) for the duration of the COVID-19 declaration under Section 564(b)(1) of the Act, 21 U.S.C. section 360bbb-3(b)(1), unless the authorization is terminated or revoked.     Resp Syncytial Virus by PCR NEGATIVE NEGATIVE Final  Comment: (NOTE) Fact Sheet for Patients: EntrepreneurPulse.com.au  Fact Sheet for Healthcare Providers: IncredibleEmployment.be  This test is not yet approved or cleared by the [place] FDA and has been authorized for detection and/or diagnosis of SARS-CoV-2 by FDA under an Emergency Use Authorization (EUA). This EUA will remain in effect (meaning this test can be used) for the duration of the COVID-19 declaration under Section 564(b)(1) of the Act, 21 U.S.C. section 360bbb-3(b)(1), unless the authorization is terminated or revoked.  Performed at [facility], [place] [address], [place], [place] [address]   Blood culture (routine x 2)     Status: Abnormal (Preliminary result)   Collection Time: 07/04/22  5:22 AM   Specimen: BLOOD  Result Value Ref Range Status   Specimen Description   Final    BLOOD LEFT ANTECUBITAL Performed at [place] [address], [place], [place] [address]    Special Requests   Final    BOTTLES DRAWN AEROBIC AND ANAEROBIC Blood Culture results may not be optimal due to an excessive volume of blood received in culture bottles Performed at [place] [address], [place], [place] [address]    Culture  Setup Time   Final    GRAM POSITIVE COCCI IN CLUSTERS ANAEROBIC BOTTLE ONLY CRITICAL VALUE NOTED.  VALUE IS CONSISTENT WITH PREVIOUSLY REPORTED AND CALLED VALUE. Performed at [facility], [place] [address], [place], [place] [address]    Culture STAPHYLOCOCCUS AUREUS (A)  Final   Report Status PENDING  Incomplete  Blood culture (routine x 2)     Status: Abnormal (Preliminary result)   Collection Time: 07/04/22  6:56 AM   Specimen: BLOOD RIGHT HAND  Result Value Ref Range Status   Specimen Description   Final    BLOOD RIGHT HAND Performed at [facility], [place] [address] [place], [place] [address]    Special Requests   Final    BOTTLES DRAWN AEROBIC AND ANAEROBIC Blood Culture results may not be optimal due to an excessive volume of blood received in culture bottles Performed at [place] [address] [place], [place] [address]    Culture  Setup Time   Final    GRAM POSITIVE COCCI IN CLUSTERS IN BOTH AEROBIC AND ANAEROBIC BOTTLES CRITICAL RESULT CALLED TO, READ BACK BY AND VERIFIED WITH: PHARMD J. LEGGE 07/01/2022 @ 0728 BY AB Performed at [facility], [place] [address], [place], [place] [address]    Culture STAPHYLOCOCCUS AUREUS (A)  Final   Report Status PENDING  Incomplete  Blood Culture ID Panel (Reflexed)     Status: Abnormal   Collection Time: 07/04/22  6:56 AM  Result Value Ref Range Status   Enterococcus faecalis NOT DETECTED NOT DETECTED Final   Enterococcus Faecium NOT DETECTED NOT DETECTED Final   Listeria monocytogenes NOT DETECTED NOT DETECTED Final   Staphylococcus species DETECTED (A) NOT DETECTED Final    Comment: CRITICAL RESULT CALLED TO, READ BACK BY AND VERIFIED WITH: PHARMD J. LEGGE 07/20/2022 @ 0728 BY AB    Staphylococcus aureus (BCID) DETECTED (A) NOT DETECTED Final    Comment: Methicillin (oxacillin)-resistant Staphylococcus aureus (MRSA). MRSA is predictably resistant to beta-lactam antibiotics (except ceftaroline). Preferred therapy is vancomycin unless clinically contraindicated. Patient requires contact precautions if  hospitalized. CRITICAL RESULT CALLED TO, READ BACK BY AND VERIFIED WITH: PHARMD J. LEGGE 06/28/2022 @  0728 BY AB    Staphylococcus epidermidis NOT DETECTED NOT DETECTED Final  Staphylococcus lugdunensis NOT DETECTED NOT DETECTED Final   Streptococcus species NOT DETECTED NOT DETECTED Final   Streptococcus agalactiae NOT DETECTED NOT DETECTED Final   Streptococcus pneumoniae NOT DETECTED NOT DETECTED Final   Streptococcus pyogenes NOT DETECTED NOT DETECTED Final   A.calcoaceticus-baumannii NOT DETECTED NOT DETECTED Final   Bacteroides fragilis NOT DETECTED NOT DETECTED Final   Enterobacterales NOT DETECTED NOT DETECTED Final   Enterobacter cloacae complex NOT DETECTED NOT DETECTED Final   Escherichia coli NOT DETECTED NOT DETECTED Final   Klebsiella aerogenes NOT DETECTED NOT DETECTED Final   Klebsiella oxytoca NOT DETECTED NOT DETECTED Final   Klebsiella pneumoniae NOT DETECTED NOT DETECTED Final   Proteus species NOT DETECTED NOT DETECTED Final   Salmonella species NOT DETECTED NOT DETECTED Final   Serratia marcescens NOT DETECTED NOT DETECTED Final   Haemophilus influenzae NOT DETECTED NOT DETECTED Final   Neisseria meningitidis NOT DETECTED NOT DETECTED Final   Pseudomonas aeruginosa NOT DETECTED NOT DETECTED Final   Stenotrophomonas maltophilia NOT DETECTED NOT DETECTED Final   Candida albicans NOT DETECTED NOT DETECTED Final   Candida auris NOT DETECTED NOT DETECTED Final   Candida glabrata NOT DETECTED NOT DETECTED Final   Candida krusei NOT DETECTED NOT DETECTED Final   Candida parapsilosis NOT DETECTED NOT DETECTED Final   Candida tropicalis NOT DETECTED NOT DETECTED Final   Cryptococcus neoformans/gattii NOT DETECTED NOT DETECTED Final   Meth resistant mecA/C and MREJ DETECTED (A) NOT DETECTED Final    Comment: CRITICAL RESULT CALLED TO, READ BACK BY AND VERIFIED WITH: PHARMD J. LEGGE 07/15/2022 @ 0728 BY AB Performed at [facility], [address] [place], [place] [address]   Surgical pcr screen     Status: Abnormal   Collection Time: 07/14/2022  2:15 PM   Specimen:  Nasal Mucosa; Nasal Swab  Result Value Ref Range Status   MRSA, PCR POSITIVE (A) NEGATIVE Final    Comment: RESULT CALLED TO, READ BACK BY AND VERIFIED WITH:  Toney Sang, RN 07/09/2022 1803 A. LAFRANCE    Staphylococcus aureus POSITIVE (A) NEGATIVE Final    Comment: (NOTE) The Xpert SA Assay (FDA approved for NASAL specimens in patients 19 years of age and older), is one component of a comprehensive surveillance program. It is not intended to diagnose infection nor to guide or monitor treatment. Performed at [facility], [place] [address], [place], [place] [address]   Aerobic Culture w Gram Stain (superficial specimen)     Status: None (Preliminary result)   Collection Time: 07/07/2022  5:12 PM   Specimen: Abscess  Result Value Ref Range Status   Specimen Description WOUND  Final   Special Requests  THORACIC EPIDURAL ABCESS  Final   Gram Stain   Final    RARE WBC PRESENT, PREDOMINANTLY PMN FEW GRAM POSITIVE COCCI    Culture   Final    ABUNDANT STAPHYLOCOCCUS AUREUS CULTURE REINCUBATED FOR BETTER GROWTH Performed at [facility], [place] [address], [place], [place] [address]    Report Status PENDING  Incomplete  Culture, blood (Routine X 2) w Reflex to ID Panel     Status: None (Preliminary result)   Collection Time: 07/06/22  4:38 AM   Specimen: BLOOD  Result Value Ref Range Status   Specimen Description BLOOD BLOOD RIGHT HAND  Final   Special Requests   Final    BOTTLES DRAWN AEROBIC AND ANAEROBIC Blood Culture adequate volume   Culture   Final  NO GROWTH < 12 HOURS Performed at [place] [address] [place], [place] [address]    Report Status PENDING  Incomplete  Culture, blood (Routine X 2) w Reflex to ID Panel     Status: None (Preliminary result)   Collection Time: 07/06/22  4:38 AM   Specimen: BLOOD  Result Value Ref Range Status   Specimen Description BLOOD BLOOD RIGHT HAND  Final   Special Requests   Final    BOTTLES DRAWN AEROBIC AND  ANAEROBIC Blood Culture adequate volume   Culture   Final    NO GROWTH < 12 HOURS Performed at [facility], [place] [address] [place],  [address]    Report Status PENDING  Incomplete     Medications:    amLODipine  10 mg Oral Daily   Chlorhexidine Gluconate Cloth  6 each Topical Daily   dextrose  12.5 g Intravenous STAT   insulin aspart  0-5 Units Subcutaneous QHS   insulin aspart  0-9 Units Subcutaneous TID WC   insulin glargine-yfgn  8 Units Subcutaneous BID   isosorbide mononitrate  60 mg Oral Daily   mupirocin ointment  1 Application Nasal BID   pantoprazole  40 mg Oral Daily   polyethylene glycol  17 g Oral Daily   polyethylene glycol  17 g Oral BID   senna-docusate  1 tablet Oral BID   sodium chloride flush  3 mL Intravenous Q12H   sodium chloride  2 g Oral BID WC   umeclidinium-vilanterol  1 puff Inhalation Daily   Continuous Infusions:  sodium chloride     sodium chloride Stopped (07/07/22 0614)   vancomycin 200 mL/hr at 07/07/22 0700      LOS: 3 days   Charlynne Cousins  [facility]  07/07/2022, 7:37 AM

## 2022-07-07 NOTE — Progress Notes (Signed)
Providing Compassionate, Quality Care - Together   Subjective: Patient's son is at the bedside. Some issues with confusion/delirium overnight.  Objective: Vital signs in last 24 hours: Temp:  [97.9 F (36.6 C)-98.7 F (37.1 C)] 98.3 F (36.8 C) (03/12 1200) Pulse Rate:  [69-129] 69 (03/12 0400) Resp:  [10-23] 15 (03/12 0400) BP: (115-148)/(34-71) 115/53 (03/12 0400) SpO2:  [91 %-100 %] 100 % (03/12 0400) Arterial Line BP: (162-174)/(55-61) 174/61 (03/11 1600) Weight:  [87.3 kg] 87.3 kg (03/12 0406)  Intake/Output from previous day: 03/11 0701 - 03/12 0700 In: 1670.3 [I.V.:1506.8; IV Piggyback:163.5] Out: 315 [Urine:305; Drains:10] Intake/Output this shift: Total I/O In: -  Out: 45 [Urine:45]  Somnolent. Will open eyes to loud voice. PERRLA No verbal response MAE, generalized weakness  Lab Results: Recent Labs    07/21/2022 1947 07/06/22 0438  WBC 26.3* 21.6*  HGB 9.5* 9.7*  HCT 30.0* 29.9*  PLT PLATELET CLUMPS NOTED ON SMEAR, UNABLE TO ESTIMATE PLATELET CLUMPS NOTED ON SMEAR, UNABLE TO ESTIMATE   BMET Recent Labs    07/06/22 0438 07/07/22 0358  NA 130* 129*  K 3.8 4.7  CL 90* 96*  CO2 23 25  GLUCOSE 91 58*  BUN 19 19  CREATININE 0.94 0.94  CALCIUM 8.4* 8.1*    Studies/Results: ECHOCARDIOGRAM COMPLETE  Result Date: 07/06/2022    ECHOCARDIOGRAM REPORT   Patient Name:   [name] Date of Exam: 07/06/2022 Medical Rec #:  [id number]       Height:       64.0 in Accession #:    [id number]      Weight:       198.0 lb Date of Birth:  07/29/1930       BSA:          1.948 m Patient Age:    89 years        BP:           115/34 mmHg Patient Gender: F               HR:           88 bpm. Exam Location:  Inpatient Procedure: 2D Echo, Cardiac Doppler, Color Doppler and Intracardiac            Opacification Agent Indications:    Bacteremia  History:        Patient has prior history of Echocardiogram examinations, most                 recent 10/19/2019. CHF, COPD; Risk  Factors:Hypertension,                 Dyslipidemia and Diabetes.  Sonographer:    Eartha Inch Referring Phys: [id number] CHAND  Sonographer Comments: Technically difficult study due to poor echo windows. Image acquisition challenging due to patient body habitus and Image acquisition challenging due to respiratory motion. IMPRESSIONS  1. Left ventricular ejection fraction, by estimation, is 70 to 75%. The left ventricle has hyperdynamic function. The left ventricle has no regional wall motion abnormalities. There is mild asymmetric left ventricular hypertrophy of the basal-septal segment. Left ventricular diastolic parameters are indeterminate.  2. Right ventricular systolic function is normal. The right ventricular size is normal.  3. Left atrial size was mild to moderately dilated.  4. Right atrial size was mild to moderately dilated.  5. The mitral valve is normal in structure. No evidence of mitral valve regurgitation. No evidence of mitral stenosis.  6. The aortic valve is tricuspid.  There is moderate calcification of the aortic valve. Aortic valve regurgitation is trivial. Mild aortic valve stenosis. Aortic valve area, by VTI measures 1.43 cm. Aortic valve mean gradient measures 17.0 mmHg. Aortic valve Vmax measures 2.73 m/s.  7. The inferior vena cava is normal in size with greater than 50% respiratory variability, suggesting right atrial pressure of 3 mmHg.  8. No TTE evidence of endocarditis. If clinical suspicion is high would recommend TEE. FINDINGS  Left Ventricle: Left ventricular ejection fraction, by estimation, is 70 to 75%. The left ventricle has hyperdynamic function. The left ventricle has no regional wall motion abnormalities. Definity contrast agent was given IV to delineate the left ventricular endocardial borders. The left ventricular internal cavity size was normal in size. There is mild asymmetric left ventricular hypertrophy of the basal-septal segment. Left ventricular diastolic  function could not be evaluated due to atrial fibrillation. Left ventricular diastolic parameters are indeterminate. Right Ventricle: The right ventricular size is normal. No increase in right ventricular wall thickness. Right ventricular systolic function is normal. Left Atrium: Left atrial size was mild to moderately dilated. Right Atrium: Right atrial size was mild to moderately dilated. Pericardium: There is no evidence of pericardial effusion. Mitral Valve: The mitral valve is normal in structure. Mild mitral annular calcification. No evidence of mitral valve regurgitation. No evidence of mitral valve stenosis. Tricuspid Valve: The tricuspid valve is normal in structure. Tricuspid valve regurgitation is trivial. No evidence of tricuspid stenosis. Aortic Valve: The aortic valve is tricuspid. There is moderate calcification of the aortic valve. Aortic valve regurgitation is trivial. Mild aortic stenosis is present. Aortic valve mean gradient measures 17.0 mmHg. Aortic valve peak gradient measures 29.8 mmHg. Aortic valve area, by VTI measures 1.43 cm. Pulmonic Valve: The pulmonic valve was normal in structure. Pulmonic valve regurgitation is trivial. No evidence of pulmonic stenosis. Aorta: The aortic root is normal in size and structure. Venous: The inferior vena cava is normal in size with greater than 50% respiratory variability, suggesting right atrial pressure of 3 mmHg. IAS/Shunts: No atrial level shunt detected by color flow Doppler.  LEFT VENTRICLE PLAX 2D LVIDd:         3.90 cm     Diastology LVIDs:         1.80 cm     LV e' medial:    10.00 cm/s LV PW:         1.20 cm     LV E/e' medial:  11.1 LV IVS:        1.20 cm     LV e' lateral:   7.07 cm/s LVOT diam:     1.80 cm     LV E/e' lateral: 15.7 LV SV:         68 LV SV Index:   35 LVOT Area:     2.54 cm  LV Volumes (MOD) LV vol d, MOD A2C: 69.4 ml LV vol d, MOD A4C: 63.9 ml LV vol s, MOD A2C: 11.1 ml LV vol s, MOD A4C: 6.7 ml LV SV MOD A2C:     58.3 ml  LV SV MOD A4C:     63.9 ml LV SV MOD BP:      58.3 ml RIGHT VENTRICLE             IVC RV S prime:     13.20 cm/s  IVC diam: 1.30 cm TAPSE (M-mode): 1.8 cm LEFT ATRIUM             Index  RIGHT ATRIUM           Index LA diam:        2.90 cm 1.49 cm/m   RA Area:     18.00 cm LA Vol (A2C):   52.4 ml 26.90 ml/m  RA Volume:   50.00 ml  25.67 ml/m LA Vol (A4C):   59.5 ml 30.55 ml/m LA Biplane Vol: 56.9 ml 29.21 ml/m  AORTIC VALVE AV Area (Vmax):    1.15 cm AV Area (Vmean):   1.19 cm AV Area (VTI):     1.43 cm AV Vmax:           273.00 cm/s AV Vmean:          197.000 cm/s AV VTI:            0.474 m AV Peak Grad:      29.8 mmHg AV Mean Grad:      17.0 mmHg LVOT Vmax:         123.00 cm/s LVOT Vmean:        92.500 cm/s LVOT VTI:          0.266 m LVOT/AV VTI ratio: 0.56  AORTA Ao Root diam: 2.80 cm Ao Asc diam:  3.00 cm MITRAL VALVE MV Area (PHT): 3.37 cm     SHUNTS MV Decel Time: 225 msec     Systemic VTI:  0.27 m MV E velocity: 111.00 cm/s  Systemic Diam: 1.80 cm MV A velocity: 99.70 cm/s MV E/A ratio:  1.11 Glori Bickers MD Electronically signed by Glori Bickers MD Signature Date/Time: 07/06/2022/3:44:20 PM    Final    DG Thoracic Spine 1 View  Result Date: 07/02/2022 CLINICAL DATA:  [id number] with elective surgery performed for extensive epidural abscess from C7-T1 through L1-2 with spinal cord compression. EXAM: OPERATIVE THORACIC SPINE 1 VIEW COMPARISON:  Thoracic and lumbar spine MRI yesterday. FINDINGS: Single lateral view is obtained from the mid T10 level through the sacrum. A metallic surgical instrument noted dorsal to T11. Trace discogenic retrolisthesis is again seen at L1-2 with otherwise unremarkable lateral alignment. There is moderate thoracolumbar spondylosis. Ankylosis across the L5-S1 disc is again shown. The discs show degenerative collapse from T11 down, as before. No endplate destructive changes are seen. All imaged vertebrae are normal in heights. There have been no significant  interval changes radiographically. IMPRESSION: 1. Metallic surgical instrument dorsal to T11. No significant interval changes radiographically. 2. Thoracolumbar spondylosis and multilevel DDD without evidence of compression fractures or visible endplate destructive change. 3. Ankylosis at L5-S1. Electronically Signed   By: Telford Nab M.D.   On: 07/04/2022 21:08   DG Chest Port 1 View  Result Date: 07/26/2022 CLINICAL DATA:  Shortness of breath EXAM: PORTABLE CHEST 1 VIEW COMPARISON:  Chest radiograph 07/04/2022 FINDINGS: Central venous catheter tip projects over the superior vena cava. Stable cardiac and mediastinal contours. Aortic atherosclerosis. New heterogeneous opacities right mid lung. Bilateral interstitial pulmonary opacities. No pleural effusion or pneumothorax. Thoracic spine degenerative changes. IMPRESSION: New heterogeneous opacities right mid lung may represent atelectasis or infection. Recommend short-term follow-up chest radiograph to ensure resolution and exclude underlying nodule. Interstitial opacities may represent vascular redistribution. Superimposed mild edema not excluded. Electronically Signed   By: Lovey Newcomer M.D.   On: 07/20/2022 16:08    Assessment/Plan: Patient underwent thoracic decompressive laminectomy for evacuation of a thoracic epidural abscess by Dr. Annette Stable on 07/09/2022. Continue IV antibiotics for now. Patient is ok to move out of the ICU when medically ready.  LOS: 3 days     Viona Gilmore, DNP, AGNP-C Nurse Practitioner  [facility] [place] [address], [address], [place], [place] [address] P: [phone]    F: [phone]  07/07/2022, 11:20 AM
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
## 2022-07-07 NOTE — Progress Notes (Signed)
[facility] for Infectious Disease  Date of Admission:  07/06/2022     Lines:  3/10-c Right ij central line 3/10-c left radial a-line     Abx: 3/10-c vanc   3/10-11 ceftriaxone                                                        Assessment: 89 yo female  copd chronic 4 liters o2 at night, hx bilateral LE dvt on xarelto, dm2, htn, HFpEF, recent admission 2 weeks prior to admission for copd exacerbation, admitted 3/9 with sepsis found to have mrsa bacteremia and extensive thoracolumbar epidural abscess/om     She has no hardware/ppm present     She is s/p I&D by NSG on 3/10 -- cx staph aureus 3/09 bcx mrsa 3/11 repeat bcx in progress   3/11 tte no obvious vegetation; moderate AV calcification   Unclear source. No rash/new thrombophlebitis finding   ------- 3/12 assessment Stable sick pod #2 epidural abscess I&D Repeat bcx ngtd Sepsis had resolved Wbc down trending   One change would recommend today is to remove central line given this was placed around bacteremic episode. Tte no obvious endocarditis. At risk for recurrent bacteremia/seeding in setting central line        Plan: Discussed with primary team / medicine regarding removing right IJ F/u repeat blood cx Could defer tee for now unless she is persistently bacteremic especially after central line removed   I spent more than 50 minute reviewing data/chart, and coordinating care and >50% direct face to face time providing counseling/discussing diagnostics/treatment plan with patient    Principal Problem:   CAP (community acquired pneumonia) Active Problems:   Chronic diastolic CHF (congestive heart failure) (HCC)   Essential hypertension   DM2 (diabetes mellitus, type 2) (HCC)   COPD (chronic obstructive pulmonary disease) ([place])   Acute hyponatremia   History of DVT (deep vein thrombosis)   Anemia of chronic disease   Epidural abscess   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    MRSA bacteremia   Vertebral osteomyelitis (HCC)   Allergies  Allergen Reactions   Buspirone Shortness Of Breath and Other (See Comments)    "couldn't breathe"   Cephalexin Anaphylaxis, Swelling and Other (See Comments)   Micardis [Telmisartan] Other (See Comments)    Hyperkalemia during hospitalization with pulmonary edema   Atorvastatin Other (See Comments)    Myalgia    Canagliflozin Other (See Comments)    Pt does not recall reaction   Doxycycline Hyclate Nausea Only   Lipitor [Atorvastatin Calcium] Other (See Comments)    Myalgia    Tramadol Nausea Only    Scheduled Meds:  amLODipine  10 mg Oral Daily   Chlorhexidine Gluconate Cloth  6 each Topical Daily   dextrose  12.5 g Intravenous STAT   insulin aspart  0-5 Units Subcutaneous QHS   insulin aspart  0-9 Units Subcutaneous TID WC   insulin glargine-yfgn  6 Units Subcutaneous BID   isosorbide mononitrate  60 mg Oral Daily   mupirocin ointment  1 Application Nasal BID   pantoprazole  40 mg Oral Daily   polyethylene glycol  17 g Oral Daily   polyethylene glycol  17 g Oral BID   senna-docusate  1 tablet Oral BID  sodium chloride flush  3 mL Intravenous Q12H   sodium chloride  2 g Oral BID WC   umeclidinium-vilanterol  1 puff Inhalation Daily   Continuous Infusions:  sodium chloride     sodium chloride     vancomycin 200 mL/hr at 07/07/22 0700   PRN Meds:.acetaminophen **OR** acetaminophen, benzonatate, haloperidol lactate, hydrALAZINE, HYDROcodone-acetaminophen, HYDROmorphone (DILAUDID) injection, HYDROmorphone (DILAUDID) injection, ipratropium-albuterol, melatonin, menthol-cetylpyridinium **OR** phenol, methocarbamol, metoprolol tartrate, ondansetron **OR** ondansetron (ZOFRAN) IV, oxyCODONE-acetaminophen, sodium chloride flush   SUBJECTIVE: Patient said back pain still there  Bed bound at this time No fever Wbc down  No other focal pain Tte done no obvious vegetation  Remains on vanc  Review of  Systems: ROS All other ROS was negative, except mentioned above     OBJECTIVE: Vitals:   07/07/22 0100 07/07/22 0400 07/07/22 0406 07/07/22 0800  BP: (!) 120/55 (!) 115/53    Pulse: (!) 101 69    Resp: 15 15    Temp:  98.3 F (36.8 C)  97.9 F (36.6 C)  TempSrc:  Axillary  Axillary  SpO2: 100% 100%    Weight:   87.3 kg   Height:       Body mass index is 33.02 kg/m.  Physical Exam General/constitutional: no distress, sleeping; easily awoken to voice; some verbal; hard at hearing HEENT: Normocephalic, PER, Conj Clear, EOM Neck supple CV: rrr no mrg Lungs: clear to auscultation, normal respiratory effort Abd: Soft, Nontender Ext: no edema Skin: No Rash Neuro: nonfocal - generalized weakness MSK: no peripheral joint swelling/tenderness/warmth  Central line presence: right ij site no erythema/purulence/tenderness   Lab Results Lab Results  Component Value Date   WBC 21.6 (H) 07/06/2022   HGB 9.7 (L) 07/06/2022   HCT 29.9 (L) 07/06/2022   MCV 90.3 07/06/2022   PLT PLATELET CLUMPS NOTED ON SMEAR, UNABLE TO ESTIMATE 07/06/2022    Lab Results  Component Value Date   CREATININE 0.94 07/07/2022   BUN 19 07/07/2022   NA 129 (L) 07/07/2022   K 4.7 07/07/2022   CL 96 (L) 07/07/2022   CO2 25 07/07/2022    Lab Results  Component Value Date   ALT 24 07/06/2022   AST 24 07/06/2022   ALKPHOS 144 (H) 07/06/2022   BILITOT 0.6 07/06/2022      Microbiology: Recent Results (from the past 240 hour(s))  Resp panel by RT-PCR (RSV, Flu A&B, Covid)     Status: None   Collection Time: 07/04/22  1:23 AM  Result Value Ref Range Status   SARS Coronavirus 2 by RT PCR NEGATIVE NEGATIVE Final    Comment: (NOTE) SARS-CoV-2 target nucleic acids are NOT DETECTED.  The SARS-CoV-2 RNA is generally detectable in upper respiratory specimens during the acute phase of infection. The lowest concentration of SARS-CoV-2 viral copies this assay can detect is 138 copies/mL. A negative  result does not preclude SARS-Cov-2 infection and should not be used as the sole basis for treatment or other patient management decisions. A negative result may occur with  improper specimen collection/handling, submission of specimen other than nasopharyngeal swab, presence of viral mutation(s) within the areas targeted by this assay, and inadequate number of viral copies(<138 copies/mL). A negative result must be combined with clinical observations, patient history, and epidemiological information. The expected result is Negative.  Fact Sheet for Patients:  EntrepreneurPulse.com.au  Fact Sheet for Healthcare Providers:  IncredibleEmployment.be  This test is no t yet approved or cleared by the [place] FDA and  has been  authorized for detection and/or diagnosis of SARS-CoV-2 by FDA under an Emergency Use Authorization (EUA). This EUA will remain  in effect (meaning this test can be used) for the duration of the COVID-19 declaration under Section 564(b)(1) of the Act, 21 U.S.C.section 360bbb-3(b)(1), unless the authorization is terminated  or revoked sooner.       Influenza A by PCR NEGATIVE NEGATIVE Final   Influenza B by PCR NEGATIVE NEGATIVE Final    Comment: (NOTE) The Xpert Xpress SARS-CoV-2/FLU/RSV plus assay is intended as an aid in the diagnosis of influenza from Nasopharyngeal swab specimens and should not be used as a sole basis for treatment. Nasal washings and aspirates are unacceptable for Xpert Xpress SARS-CoV-2/FLU/RSV testing.  Fact Sheet for Patients: EntrepreneurPulse.com.au  Fact Sheet for Healthcare Providers: IncredibleEmployment.be  This test is not yet approved or cleared by the [place] FDA and has been authorized for detection and/or diagnosis of SARS-CoV-2 by FDA under an Emergency Use Authorization (EUA). This EUA will remain in effect (meaning this test can be used)  for the duration of the COVID-19 declaration under Section 564(b)(1) of the Act, 21 U.S.C. section 360bbb-3(b)(1), unless the authorization is terminated or revoked.     Resp Syncytial Virus by PCR NEGATIVE NEGATIVE Final    Comment: (NOTE) Fact Sheet for Patients: EntrepreneurPulse.com.au  Fact Sheet for Healthcare Providers: IncredibleEmployment.be  This test is not yet approved or cleared by the [place] FDA and has been authorized for detection and/or diagnosis of SARS-CoV-2 by FDA under an Emergency Use Authorization (EUA). This EUA will remain in effect (meaning this test can be used) for the duration of the COVID-19 declaration under Section 564(b)(1) of the Act, 21 U.S.C. section 360bbb-3(b)(1), unless the authorization is terminated or revoked.  Performed at [facility], [place] [address], [place], [place] [address]   Blood culture (routine x 2)     Status: Abnormal   Collection Time: 07/04/22  5:22 AM   Specimen: BLOOD  Result Value Ref Range Status   Specimen Description   Final    BLOOD LEFT ANTECUBITAL Performed at [facility] [address] [place], [place] [address]    Special Requests   Final    BOTTLES DRAWN AEROBIC AND ANAEROBIC Blood Culture results may not be optimal due to an excessive volume of blood received in culture bottles Performed at [facility] [address], [place], [place] [address]    Culture  Setup Time   Final    GRAM POSITIVE COCCI IN CLUSTERS ANAEROBIC BOTTLE ONLY CRITICAL VALUE NOTED.  VALUE IS CONSISTENT WITH PREVIOUSLY REPORTED AND CALLED VALUE.    Culture (A)  Final    STAPHYLOCOCCUS AUREUS SUSCEPTIBILITIES PERFORMED ON PREVIOUS CULTURE WITHIN THE LAST 5 DAYS. Performed at [facility], [place] [address], [place], [place] [address]    Report Status 07/07/2022 FINAL  Final  Blood culture (routine x 2)     Status: Abnormal    Collection Time: 07/04/22  6:56 AM   Specimen: BLOOD RIGHT HAND  Result Value Ref Range Status   Specimen Description   Final    BLOOD RIGHT HAND Performed at [facility], [place] [address] [place], [place] [address]    Special Requests   Final    BOTTLES DRAWN AEROBIC AND ANAEROBIC Blood Culture results may not be optimal due to an excessive volume of blood received in culture bottles Performed at [facility] [address]  [place], [place] [address]    Culture  Setup Time   Final    GRAM POSITIVE COCCI IN CLUSTERS IN BOTH AEROBIC AND ANAEROBIC BOTTLES CRITICAL RESULT CALLED TO, READ BACK BY AND VERIFIED WITH: PHARMD J. LEGGE 07/23/2022 @ 0728 BY AB Performed at [facility], [place] [address], [place], [place] [address]    Culture METHICILLIN RESISTANT STAPHYLOCOCCUS AUREUS (A)  Final   Report Status 07/07/2022 FINAL  Final   Organism ID, Bacteria METHICILLIN RESISTANT STAPHYLOCOCCUS AUREUS  Final      Susceptibility   Methicillin resistant staphylococcus aureus - MIC*    CIPROFLOXACIN >=8 RESISTANT Resistant     ERYTHROMYCIN >=8 RESISTANT Resistant     GENTAMICIN <=0.5 SENSITIVE Sensitive     OXACILLIN >=4 RESISTANT Resistant     TETRACYCLINE <=1 SENSITIVE Sensitive     VANCOMYCIN 1 SENSITIVE Sensitive     TRIMETH/SULFA <=10 SENSITIVE Sensitive     CLINDAMYCIN >=8 RESISTANT Resistant     RIFAMPIN <=0.5 SENSITIVE Sensitive     Inducible Clindamycin NEGATIVE Sensitive     * METHICILLIN RESISTANT STAPHYLOCOCCUS AUREUS  Blood Culture ID Panel (Reflexed)     Status: Abnormal   Collection Time: 07/04/22  6:56 AM  Result Value Ref Range Status   Enterococcus faecalis NOT DETECTED NOT DETECTED Final   Enterococcus Faecium NOT DETECTED NOT DETECTED Final   Listeria monocytogenes NOT DETECTED NOT DETECTED Final   Staphylococcus species DETECTED (A) NOT DETECTED Final    Comment: CRITICAL RESULT CALLED TO, READ BACK BY AND VERIFIED WITH: PHARMD J. LEGGE  07/23/2022 @ 0728 BY AB    Staphylococcus aureus (BCID) DETECTED (A) NOT DETECTED Final    Comment: Methicillin (oxacillin)-resistant Staphylococcus aureus (MRSA). MRSA is predictably resistant to beta-lactam antibiotics (except ceftaroline). Preferred therapy is vancomycin unless clinically contraindicated. Patient requires contact precautions if  hospitalized. CRITICAL RESULT CALLED TO, READ BACK BY AND VERIFIED WITH: PHARMD J. LEGGE 07/20/2022 @ 0728 BY AB    Staphylococcus epidermidis NOT DETECTED NOT DETECTED Final   Staphylococcus lugdunensis NOT DETECTED NOT DETECTED Final   Streptococcus species NOT DETECTED NOT DETECTED Final   Streptococcus agalactiae NOT DETECTED NOT DETECTED Final   Streptococcus pneumoniae NOT DETECTED NOT DETECTED Final   Streptococcus pyogenes NOT DETECTED NOT DETECTED Final   A.calcoaceticus-baumannii NOT DETECTED NOT DETECTED Final   Bacteroides fragilis NOT DETECTED NOT DETECTED Final   Enterobacterales NOT DETECTED NOT DETECTED Final   Enterobacter cloacae complex NOT DETECTED NOT DETECTED Final   Escherichia coli NOT DETECTED NOT DETECTED Final   Klebsiella aerogenes NOT DETECTED NOT DETECTED Final   Klebsiella oxytoca NOT DETECTED NOT DETECTED Final   Klebsiella pneumoniae NOT DETECTED NOT DETECTED Final   Proteus species NOT DETECTED NOT DETECTED Final   Salmonella species NOT DETECTED NOT DETECTED Final   Serratia marcescens NOT DETECTED NOT DETECTED Final   Haemophilus influenzae NOT DETECTED NOT DETECTED Final   Neisseria meningitidis NOT DETECTED NOT DETECTED Final   Pseudomonas aeruginosa NOT DETECTED NOT DETECTED Final   Stenotrophomonas maltophilia NOT DETECTED NOT DETECTED Final   Candida albicans NOT DETECTED NOT DETECTED Final   Candida auris NOT DETECTED NOT DETECTED Final   Candida glabrata NOT DETECTED NOT DETECTED Final   Candida krusei NOT DETECTED NOT DETECTED Final   Candida parapsilosis NOT DETECTED NOT DETECTED Final   Candida  tropicalis NOT DETECTED NOT DETECTED Final   Cryptococcus neoformans/gattii NOT DETECTED NOT DETECTED Final   Meth resistant mecA/C and MREJ DETECTED (A) NOT DETECTED  Final    Comment: CRITICAL RESULT CALLED TO, READ BACK BY AND VERIFIED WITH: PHARMD J. LEGGE 07/06/2022 @ 0728 BY AB Performed at [facility], [place] [address], [place], [place] [address]   Surgical pcr screen     Status: Abnormal   Collection Time: 07/09/2022  2:15 PM   Specimen: Nasal Mucosa; Nasal Swab  Result Value Ref Range Status   MRSA, PCR POSITIVE (A) NEGATIVE Final    Comment: RESULT CALLED TO, READ BACK BY AND VERIFIED WITH:  Toney Sang, RN 07/09/2022 1803 A. LAFRANCE    Staphylococcus aureus POSITIVE (A) NEGATIVE Final    Comment: (NOTE) The Xpert SA Assay (FDA approved for NASAL specimens in patients 13 years of age and older), is one component of a comprehensive surveillance program. It is not intended to diagnose infection nor to guide or monitor treatment. Performed at [facility], [place] [address] [place], [place] [address]   Aerobic Culture w Gram Stain (superficial specimen)     Status: None (Preliminary result)   Collection Time: 07/23/2022  5:12 PM   Specimen: Abscess  Result Value Ref Range Status   Specimen Description WOUND  Final   Special Requests  THORACIC EPIDURAL ABCESS  Final   Gram Stain   Final    RARE WBC PRESENT, PREDOMINANTLY PMN FEW GRAM POSITIVE COCCI    Culture   Final    ABUNDANT STAPHYLOCOCCUS AUREUS SUSCEPTIBILITIES TO FOLLOW Performed at [facility], [place] [address], [place], [place] [address]    Report Status PENDING  Incomplete  Culture, blood (Routine X 2) w Reflex to ID Panel     Status: None (Preliminary result)   Collection Time: 07/06/22  4:38 AM   Specimen: BLOOD  Result Value Ref Range Status   Specimen Description BLOOD BLOOD RIGHT HAND  Final   Special Requests   Final    BOTTLES DRAWN AEROBIC AND ANAEROBIC Blood Culture adequate volume    Culture   Final    NO GROWTH 1 DAY Performed at [facility], [place] [address], [place], [place] [address]    Report Status PENDING  Incomplete  Culture, blood (Routine X 2) w Reflex to ID Panel     Status: None (Preliminary result)   Collection Time: 07/06/22  4:38 AM   Specimen: BLOOD  Result Value Ref Range Status   Specimen Description BLOOD BLOOD RIGHT HAND  Final   Special Requests   Final    BOTTLES DRAWN AEROBIC AND ANAEROBIC Blood Culture adequate volume   Culture   Final    NO GROWTH 1 DAY Performed at [facility], [place] [address] [place], [place] [address]    Report Status PENDING  Incomplete     Serology:   Imaging: If present, new imagings (plain films, ct scans, and mri) have been personally visualized and interpreted; radiology reports have been reviewed. Decision making incorporated into the Impression / Recommendations.  3/9 ct abd pelv with contrast 1. No acute intra-abdominal or pelvic pathology. 2. Sigmoid diverticulosis. No bowel obstruction. Normal appendix. 3. Bibasilar subpleural streaky and reticular atelectasis or infiltrate. Aspiration is not excluded clinical correlation is recommended. 4.  Aortic Atherosclerosis     3/9 cxr FINDINGS: Stable cardiomegaly. Aortic atherosclerotic calcification. Pulmonary vascular congestion. Unchanged mild left basilar airspace opacities. No definite pleural effusion. No pneumothorax. No acute osseous abnormality. Postoperative changes right humeral head.   IMPRESSION: 1. Cardiomegaly with pulmonary vascular congestion. 2. Unchanged mild  left basilar atelectasis/scarring.     3/10 mri lumbar thoracic spine wwo contrast 1. Longitudinally extensive epidural abscess extending from at least C7-T1 caudally through the L1-2. Based on the localizer images, suspect involvement of the cervical spinal canal as well. 2. Multifocal moderate to severe compression of the spinal cord at T8-9, T10-11, T11-12. 3.  Disc edema with adjacent endplate marrow edema and enhancement at T6-7, T8-9, T10-11 and T11-12. Findings are suspicious for multifocal discitis-osteomyelitis, though there is little correlate on the recent CTA chest. 4. Clumping of the cauda equina nerve roots and conus with associated leptomeningeal enhancement, consistent with superimposed arachnoiditis.   3/11 tte  1. Left ventricular ejection fraction, by estimation, is 70 to 75%. The  left ventricle has hyperdynamic function. The left ventricle has no  regional wall motion abnormalities. There is mild asymmetric left  ventricular hypertrophy of the basal-septal  segment. Left ventricular diastolic parameters are indeterminate.   2. Right ventricular systolic function is normal. The right ventricular  size is normal.   3. Left atrial size was mild to moderately dilated.   4. Right atrial size was mild to moderately dilated.   5. The mitral valve is normal in structure. No evidence of mitral valve  regurgitation. No evidence of mitral stenosis.   6. The aortic valve is tricuspid. There is moderate calcification of the  aortic valve. Aortic valve regurgitation is trivial. Mild aortic valve  stenosis. Aortic valve area, by VTI measures 1.43 cm. Aortic valve mean  gradient measures 17.0 mmHg. Aortic  valve Vmax measures 2.73 m/s.   7. The inferior vena cava is normal in size with greater than 50%  respiratory variability, suggesting right atrial pressure of 3 mmHg.   8. No TTE evidence of endocarditis. If clinical suspicion is high would  recommend TEE.       Jabier Mutton, [place] for Infectious [place] [phone] pager    07/07/2022, 10:48 AM

## 2022-07-08 ENCOUNTER — Ambulatory Visit: Payer: TRICARE For Life (TFL) | Admitting: Cardiology

## 2022-07-08 DIAGNOSIS — G061 Intraspinal abscess and granuloma: Secondary | ICD-10-CM | POA: Diagnosis not present

## 2022-07-08 DIAGNOSIS — J189 Pneumonia, unspecified organism: Secondary | ICD-10-CM | POA: Diagnosis not present

## 2022-07-08 DIAGNOSIS — E871 Hypo-osmolality and hyponatremia: Secondary | ICD-10-CM | POA: Diagnosis not present

## 2022-07-08 LAB — AEROBIC CULTURE W GRAM STAIN (SUPERFICIAL SPECIMEN)

## 2022-07-08 LAB — BASIC METABOLIC PANEL
Anion gap: 9 (ref 5–15)
BUN: 18 mg/dL (ref 8–23)
CO2: 24 mmol/L (ref 22–32)
Calcium: 8.7 mg/dL — ABNORMAL LOW (ref 8.9–10.3)
Chloride: 95 mmol/L — ABNORMAL LOW (ref 98–111)
Creatinine, Ser: 0.86 mg/dL (ref 0.44–1.00)
GFR, Estimated: >60 mL/min (ref >60)
Glucose, Bld: 196 mg/dL — ABNORMAL HIGH (ref 70–99)
Potassium: 4.9 mmol/L (ref 3.5–5.1)
Sodium: 128 mmol/L — ABNORMAL LOW (ref 135–145)

## 2022-07-08 LAB — HEMOGLOBIN A1C
Hgb A1c MFr Bld: 9.1 % — ABNORMAL HIGH (ref 4.8–5.6)
Mean Plasma Glucose: 214 mg/dL

## 2022-07-08 LAB — GLUCOSE, CAPILLARY
Glucose-Capillary: 160 mg/dL — ABNORMAL HIGH (ref 70–99)
Glucose-Capillary: 156 mg/dL — ABNORMAL HIGH (ref 70–99)
Glucose-Capillary: 181 mg/dL — ABNORMAL HIGH (ref 70–99)
Glucose-Capillary: 152 mg/dL — ABNORMAL HIGH (ref 70–99)
Glucose-Capillary: 80 mg/dL (ref 70–99)

## 2022-07-08 LAB — VANCOMYCIN, TROUGH: Vancomycin Tr: 15 ug/mL (ref 15–20)

## 2022-07-08 LAB — VANCOMYCIN, RANDOM: Vancomycin Rm: 30 ug/mL

## 2022-07-08 MED ORDER — SODIUM CHLORIDE 0.9 % IV SOLN
600.0000 mg | Freq: Every day | INTRAVENOUS | Status: DC
  Administered 2022-07-08 – 2022-07-18 (×11): 600 mg via INTRAVENOUS
  Filled 2022-07-08 (×13): qty 12

## 2022-07-08 MED ORDER — GERHARDT'S BUTT CREAM
TOPICAL_CREAM | Freq: Every day | CUTANEOUS | Status: DC
  Filled 2022-07-08: qty 1

## 2022-07-08 NOTE — Progress Notes (Signed)
Mental status little better today.  Still somewhat confused and mildly disoriented.  Patient states her pain is well-controlled.  Moving her legs well.  She is afebrile.  Her vital signs are stable.  Her urine output is good.  Her drain output is minimal.  Moving legs to command bilaterally.  Strength difficult to fully assess but appears to be at least 4+/5.  Wound dressing dry.  Abdomen soft.  Progressing about as well as could be hoped.  Oxygenation status still looks good.  Mobility poor.  Okay to transfer out of ICU from my standpoint.  Continue efforts at mobilization continue IV antibiotics.  We will remove the drain today.

## 2022-07-08 NOTE — Progress Notes (Signed)
Pharmacy Antibiotic Note  [name] is a 89 y.o. female admitted on 07/15/2022 with MRSA bacteremia and thoracolumbar abscess.  Pharmacy has been consulted for vancomycin dosing. SCr steady at < 1. Vancomycin levels today show a peak of 30 and a trough of 15 for a therapeutic AUC of 531.  Plan: Continue Vancomycin 1000 mg every 24 hours  Monitor cultures, renal function and levels    Height: 5' 4.02" (162.6 cm) Weight: 87.4 kg (192 lb 10.9 oz) IBW/kg (Calculated) : 54.74  Temp (24hrs), Avg:98.1 F (36.7 C), Min:97.8 F (36.6 C), Max:98.3 F (36.8 C)  Recent Labs  Lab 07/05/2022 2228 07/04/22 0500 07/09/2022 0525 07/24/2022 1947 07/06/22 0438 07/07/22 0358 07/08/22 0529 07/08/22 0627 07/08/22 1013  WBC 27.1* 25.5* 23.6* 26.3* 21.6*  --   --   --   --   CREATININE 1.07* 1.00 0.86 0.98 0.94 0.94 0.86  --   --   LATICACIDVEN  --  1.2  --   --   --   --   --   --   --   VANCOTROUGH  --   --   --   --   --   --   --  15  --   VANCORANDOM  --   --   --   --   --   --   --   --  30     Estimated Creatinine Clearance: 47.5 mL/min (by C-G formula based on SCr of 0.86 mg/dL).    Allergies  Allergen Reactions   Buspirone Shortness Of Breath and Other (See Comments)    "couldn't breathe"   Cephalexin Anaphylaxis, Swelling and Other (See Comments)   Micardis [Telmisartan] Other (See Comments)    Hyperkalemia during hospitalization with pulmonary edema   Atorvastatin Other (See Comments)    Myalgia    Canagliflozin Other (See Comments)    Pt does not recall reaction   Doxycycline Hyclate Nausea Only   Lipitor [Atorvastatin Calcium] Other (See Comments)    Myalgia    Tramadol Nausea Only      Thank you for allowing pharmacy to be a part of this patient's care.  Jimmy Footman, PharmD, BCPS, [facility] Clinical Pharmacist Phone: [phone] 07/08/2022 12:04 PM  Please check AMION for all [employer] phone numbers After 10:00 PM, call [facility]  [phone]

## 2022-07-08 NOTE — Progress Notes (Signed)
   Inpatient Rehab Admissions Coordinator :  Per family request,patient was screened for CIR candidacy by Danne Baxter RN MSN. Patient is not at a level to tolerate the intensity required to pursue a CIR admit. Recommend other rehab venue options. Please contact me with any questions.  Danne Baxter RN MSN Admissions Coordinator [phone]

## 2022-07-08 NOTE — TOC Progression Note (Signed)
Transition of Care [facility]) - Progression Note    Patient Details  Name: TRASHA RUBERT MRN: [id number] Date of Birth: 22-Sep-1930  Transition of Care [facility] CM/SW [place], LCSW Phone Number: 07/08/2022, 9:48 AM  Clinical Narrative:    Patient's daughter reports she is touring [place] and [place] today and asked if CIR could screen patient. CSW sent to CIR for review.    Expected Discharge Plan: Skilled Nursing Facility Barriers to Discharge: SNF Pending bed offer, Continued Medical Work up  Expected Discharge Plan and Services In-house Referral: Clinical Social Work     Living arrangements for the past 2 months: Single Family Home                                       Social Determinants of Health (SDOH) Interventions SDOH Screenings   Food Insecurity: No Food Insecurity (07/04/2022)  Housing: Low Risk  (07/04/2022)  Transportation Needs: No Transportation Needs (07/04/2022)  Utilities: Not At Risk (07/04/2022)  Tobacco Use: Low Risk  (07/05/2022)    Readmission Risk Interventions    07/06/2022    4:11 PM 05/07/2022    3:17 PM  Readmission Risk Prevention Plan  Transportation Screening Complete Complete  PCP or Specialist Appt within 3-5 Days  Complete  HRI or [place]  Complete  Social Work Consult for [place] Planning/Counseling  [place]  Not Applicable  Medication Review Press photographer) Complete Complete  PCP or Specialist appointment within 3-5 days of discharge Complete   HRI or [place] Complete   SW Recovery Care/Counseling Consult Complete   Palliative Care Screening Not Applicable   Skilled Nursing Facility Complete

## 2022-07-08 NOTE — Progress Notes (Signed)
Physical Therapy Treatment Patient Details Name: [name] MRN: [id number] DOB: [birth year] Today's Date: 07/08/2022   History of Present Illness 89 yo female presenting 3/8 with abdominal pain. Work up revealed CAP. Pt transferred to [facility] on 3/10 after MRI showed "extensive epidural abscess extending from at least C7 / T1 caudally through the L1-2 with multifocal mod / severe compression of the spinal cord at T8-9, T10-11, T11-12" And is now s/p T9-11 laminectomy with evacuation of epidural abscess on 3/10. PMH includes: cellulitis of Left leg, CHF, COPD, DM II, glaucoma, HLD, and HTN.    PT Comments    Pt is demonstrating deficits in arousal, command following, cognition, gross strength, and balance. She did become more alert and followed more commands once sitting up. Pt displayed moments where her muscles would release from a contraction, like her hand falling when holding a cup or her bil knees buckling when standing. Her weakness appeared to be fairly symmetrical in her legs though with attempts at MMT. She did tend to not look to her R often, even though she was able to, and she would lean to her L when sitting EOB. OT reported her weakness seemed to be more present in her L UE than her R, but her L UE was swollen and weeping. Notified RN of these findings. Pt required minA to roll, modAx2 to sit up from sidelying, and maxAx2 to transfer to stand and take pivotal steps to the chair with bil HHA. Will continue to follow acutely. Current recommendations remain appropriate.     Recommendations for follow up therapy are one component of a multi-disciplinary discharge planning process, led by the attending physician.  Recommendations may be updated based on patient status, additional functional criteria and insurance authorization.  Follow Up Recommendations  Skilled nursing-short term rehab (<3 hours/day) Can patient physically be transported by private vehicle: No   Assistance Recommended  at Discharge Frequent or constant Supervision/Assistance  Patient can return home with the following Two people to help with walking and/or transfers;Two people to help with bathing/dressing/bathroom;Direct supervision/assist for medications management;Direct supervision/assist for financial management;Assist for transportation;Help with stairs or ramp for entrance;Assistance with feeding;Assistance with cooking/housework   Equipment Recommendations  Other (comment) (defer to post acute)    Recommendations for Other Services       Precautions / Restrictions Precautions Precautions: Fall;Back Precaution Booklet Issued: No Precaution Comments: thoracic hemovac Restrictions Weight Bearing Restrictions: No     Mobility  Bed Mobility Overal bed mobility: Needs Assistance Bed Mobility: Rolling, Sidelying to Sit Rolling: Min assist Sidelying to sit: Mod assist, +2 for physical assistance       General bed mobility comments: VC provided for hand placement and use of bed railing. Assist provided to bring BLE completely off bed as patient only advanced RLE off. Assist to bring trunk off HOB    Transfers Overall transfer level: Needs assistance Equipment used: 2 person hand held assist (2 person face to face transfer) Transfers: Sit to/from Stand, Bed to chair/wheelchair/BSC Sit to Stand: Max assist, +2 physical assistance, From elevated surface   Step pivot transfers: +2 physical assistance, Max assist, +2 safety/equipment       General transfer comment: Pt required physical assist to move RLE in order to step towards recliner to her R. Manual facilitation provided under buttocks to bring hips forward and correct standing posture. Knees needing blocking bil due to spontaneous bil knee buckling.    Ambulation/Gait Ambulation/Gait assistance: +2 physical assistance, +2 safety/equipment, Max assist  Gait Distance (Feet): 2 Feet Assistive device: 2 person hand held assist Gait  Pattern/deviations: Step-to pattern, Decreased step length - right, Decreased weight shift to left, Decreased weight shift to right, Decreased stride length, Knees buckling Gait velocity: reduced Gait velocity interpretation: <1.31 ft/sec, indicative of household ambulator   General Gait Details: Pt needing maxAx2 to provide stability, weight shift pt bil, advance R leg to step to R from bed to recliner, and block bil knees during spontaneous bil knee buckling bouts. x2 pivotal steps   Stairs             Wheelchair Mobility    Modified Rankin (Stroke Patients Only)       Balance Overall balance assessment: Needs assistance Sitting-balance support: Feet supported, Bilateral upper extremity supported Sitting balance-Leahy Scale: Poor Sitting balance - Comments: seated EOB with L lateral lean. Providing VC to self correct. Pt attempted although required additional physical assist Postural control: Posterior lean, Left lateral lean Standing balance support: Bilateral upper extremity supported, During functional activity Standing balance-Leahy Scale: Zero Standing balance comment: MaxAx2 and bil UE support and bil knee block to stand                            Cognition Arousal/Alertness: Awake/alert, Lethargic (lethargic initially in bed, more alert when mobile) Behavior During Therapy: Flat affect Overall Cognitive Status: Impaired/Different from baseline Area of Impairment: Orientation, Attention, Memory, Following commands, Safety/judgement, Awareness                 Orientation Level: Disoriented to, Time, Situation Current Attention Level: Sustained Memory: Decreased short-term memory Following Commands: Follows one step commands consistently Safety/Judgement: Decreased awareness of safety, Decreased awareness of deficits Awareness: Intellectual   General Comments: Son Edd Arbour present and provided feedback on accuracy of pt's statements during session.  Pt initially lethargic and not holding conversation consistently. Needs multi-modal cues to initiate all mobility        Exercises General Exercises - Lower Extremity Long Arc Quad: AROM, Both, 5 reps, Seated    General Comments General comments (skin integrity, edema, etc.): VSS on 4L supplemental oxygen; strength appeared fairly symmetrical, grossly weak bil with attempts at MMT      Pertinent Vitals/Pain Pain Assessment Pain Assessment: Faces Faces Pain Scale: Hurts a little bit Pain Location: generalized with movement Pain Descriptors / Indicators: Discomfort, Grimacing, Moaning Pain Intervention(s): Limited activity within patient's tolerance, Monitored during session, Repositioned    Home Living                          Prior Function            PT Goals (current goals can now be found in the care plan section) Acute Rehab PT Goals Patient Stated Goal: did not state PT Goal Formulation: With patient/family Time For Goal Achievement: 07/20/22 Potential to Achieve Goals: Fair Progress towards PT goals: Progressing toward goals    Frequency    Min 3X/week      PT Plan Current plan remains appropriate    Co-evaluation PT/OT/SLP Co-Evaluation/Treatment: Yes Reason for Co-Treatment: For patient/therapist safety;Complexity of the patient's impairments (multi-system involvement);To address functional/ADL transfers PT goals addressed during session: Mobility/safety with mobility;Balance OT goals addressed during session: ADL's and self-care;Strengthening/ROM      AM-PAC PT "6 Clicks" Mobility   Outcome Measure  Help needed turning from your back to your side while in a flat bed  without using bedrails?: A Lot Help needed moving from lying on your back to sitting on the side of a flat bed without using bedrails?: Total Help needed moving to and from a bed to a chair (including a wheelchair)?: Total Help needed standing up from a chair using your arms  (e.g., wheelchair or bedside chair)?: Total Help needed to walk in hospital room?: Total Help needed climbing 3-5 steps with a railing? : Total 6 Click Score: 7    End of Session Equipment Utilized During Treatment: Gait belt;Oxygen Activity Tolerance: Patient tolerated treatment well Patient left: in chair;with call bell/phone within reach;with chair alarm set;with family/visitor present Nurse Communication: Mobility status;Other (comment) (moments of not looking to R, but leans to L, and bil knee buckling and full body weakness) PT Visit Diagnosis: Other abnormalities of gait and mobility (R26.89);Muscle weakness (generalized) (M62.81);Unsteadiness on feet (R26.81);Difficulty in walking, not elsewhere classified (R26.2)     Time: [id number] PT Time Calculation (min) (ACUTE ONLY): 36 min  Charges:  $Therapeutic Activity: 8-22 mins                     Moishe Spice, PT, DPT Acute Rehabilitation Services  Office: [phone]    Orvan Falconer 07/08/2022, 2:10 PM

## 2022-07-08 NOTE — Progress Notes (Signed)
Occupational Therapy Treatment Patient Details Name: SEMEKA ELLESTAD MRN: [id number] DOB: 10-17-1930 Today's Date: 07/08/2022   History of present illness 89 yo female presenting 3/8 with abdominal pain. Work up revealed CAP. Pt transferred to [facility] on 3/10 after MRI showed "extensive epidural abscess extending from at least C7 / T1 caudally through the L1-2 with multifocal mod / severe compression of the spinal cord at T8-9, T10-11, T11-12" And is now s/p T9-11 laminectomy with evacuation of epidural abscess on 3/10. PMH includes: cellulitis of Left leg, CHF, COPD, DM II, glaucoma, HLD, and HTN.   OT comments  Pt in bed upon therapy arrival with Son, Edd Arbour present at bedside. Session focused on direction following, bed mobility and functional transfers. Pt very HOH and had difficulty following commands during session. Ronnie assisted when able. Pt used primarily her RUE during functional activities. Demonstrated BUE weakness with LUE>RUE. OT will continue to follow patient acutely.    Recommendations for follow up therapy are one component of a multi-disciplinary discharge planning process, led by the attending physician.  Recommendations may be updated based on patient status, additional functional criteria and insurance authorization.    Follow Up Recommendations  Skilled nursing-short term rehab (<3 hours/day)     Assistance Recommended at Discharge Frequent or constant Supervision/Assistance  Patient can return home with the following  Assistance with cooking/housework;Direct supervision/assist for medications management;Direct supervision/assist for financial management;Help with stairs or ramp for entrance;Assist for transportation;Two people to help with walking and/or transfers;Two people to help with bathing/dressing/bathroom   Equipment Recommendations  Other (comment) (TBD)       Precautions / Restrictions Precautions Precautions: Fall;Back Precaution Comments: thoracic  drain Restrictions Weight Bearing Restrictions: No       Mobility Bed Mobility Overal bed mobility: Needs Assistance Bed Mobility: Rolling, Sidelying to Sit Rolling: Min assist Sidelying to sit: Mod assist, +2 for physical assistance       General bed mobility comments: VC provided for hand placement and use of bed railing. Assist provided to bring BLE completely off bed as patient only advanced RLE off. Assist to bring trunk off HOB Patient Response: Flat affect  Transfers Overall transfer level: Needs assistance Equipment used: 2 person hand held assist (2 person face to face transfer) Transfers: Sit to/from Stand, Bed to chair/wheelchair/BSC Sit to Stand: Max assist, +2 physical assistance, From elevated surface     Step pivot transfers: +2 physical assistance     General transfer comment: Pt required physical assist to move RLE in order to step towards recliner. Manual facilitation provided under buttocks to bring hips forward and correct standing posture.     Balance Overall balance assessment: Needs assistance Sitting-balance support: Feet supported, Bilateral upper extremity supported Sitting balance-Leahy Scale: Poor Sitting balance - Comments: seated EOB. Providing VC to self correct. Pt attempted although required additional physical assist Postural control: Posterior lean, Left lateral lean Standing balance support: Single extremity supported Standing balance-Leahy Scale: Zero Standing balance comment: Pt reliant on therapist to maintain balance.       ADL either performed or assessed with clinical judgement   ADL Overall ADL's : Needs assistance/impaired Eating/Feeding: Minimal assistance;Sitting Eating/Feeding Details (indicate cue type and reason): pt sat in recliner. Drank water and grape juice. Required assist to stabilize straw. Due to frequent muscle jerks therapist provided light min assist to stable covered cup when needed. Grooming: Agricultural engineer;Total assistance;Sitting Grooming Details (indicate cue type and reason): Placed washcloth in pt's hand and requested her to wash face.  Pt did not attempt.     Lower Body Bathing: Total assistance;+2 for physical assistance;Bed level       Toileting- Clothing Manipulation and Hygiene: Total assistance;+2 for physical assistance;Bed level                Cognition Arousal/Alertness:  (initially sleepy/drowsy. Did become more alert throughout session) Behavior During Therapy: Flat affect Overall Cognitive Status: Impaired/Different from baseline Area of Impairment: Orientation, Attention, Memory, Following commands, Safety/judgement, Awareness   Orientation Level: Disoriented to, Time, Situation   Memory: Decreased short-term memory Following Commands: Follows one step commands consistently Safety/Judgement: Decreased awareness of safety, Decreased awareness of deficits     General Comments: Son Edd Arbour present and provided feedback on accuracy of pt's statements during session              General Comments VSS on 4L supplemental oxygen. LUE weeping edema present.     Pertinent Vitals/ Pain       Pain Assessment Pain Assessment: Faces Faces Pain Scale: Hurts a little bit Pain Location: generalized with movement Pain Descriptors / Indicators: Discomfort, Grimacing, Moaning Pain Intervention(s): Limited activity within patient's tolerance, Monitored during session         Frequency  Min 2X/week        Progress Toward Goals  OT Goals(current goals can now be found in the care plan section)  Progress towards OT goals: Progressing toward goals     Plan Discharge plan remains appropriate;Frequency remains appropriate    Co-evaluation    PT/OT/SLP Co-Evaluation/Treatment: Yes Reason for Co-Treatment: For patient/therapist safety;Complexity of the patient's impairments (multi-system involvement);To address functional/ADL transfers   OT goals addressed  during session: ADL's and self-care;Strengthening/ROM      AM-PAC OT "6 Clicks" Daily Activity     Outcome Measure   Help from another person eating meals?: A Little Help from another person taking care of personal grooming?: Total Help from another person toileting, which includes using toliet, bedpan, or urinal?: Total Help from another person bathing (including washing, rinsing, drying)?: Total Help from another person to put on and taking off regular upper body clothing?: Total Help from another person to put on and taking off regular lower body clothing?: Total 6 Click Score: 8    End of Session Equipment Utilized During Treatment: Oxygen;Gait belt  OT Visit Diagnosis: Muscle weakness (generalized) (M62.81);Pain;Unsteadiness on feet (R26.81) Pain - part of body:  (back)   Activity Tolerance Patient tolerated treatment well;Patient limited by fatigue   Patient Left in bed;with call bell/phone within reach;with chair alarm set;with family/visitor present   Nurse Communication Mobility status        Time: [id number] OT Time Calculation (min): 36 min  Charges: OT General Charges $OT Visit: 1 Visit OT Treatments $Therapeutic Activity: 8-22 mins  Ailene Ravel, OTR/L,CBIS  Supplemental OT - [facility] and [facility] Secure Chat Preferred    Cresencia Asmus, Clarene Duke 07/08/2022, 1:16 PM

## 2022-07-08 NOTE — Progress Notes (Signed)
[facility]  PROGRESS NOTE  [name] [id number] DOB: Feb 12, 1931 DOA: 07/05/2022 PCP: [name], DO   Brief HPI:   89 year old female with past medical history of chronic hypoxemic respiratory failure on 4 L of oxygen, chronic diastolic heart failure, diabetes mellitus type 2, DVT on Xarelto admitted to [facility] long hospital on 07/04/2022 for right-sided chest discomfort, found to be septic due to pneumonia, started on Rocephin and Zithromax.  On 07/15/2022 blood culture positive for MRSA, imaging showed epidural abscess with cord compression, disc edema.  Neurosurgery was consulted, patient was transferred to [facility] for incision and drainage of T9, T10 and T11, surgical intervention with laminectomy.    Subjective   Patient seen and examined, denies any complaints.  Pain well-controlled   Assessment/Plan:    Sepsis due to MRSA bacteremia -Extensive abscess of thoracic lumbar spine, T9, T10, T11 -S/p laminectomy and I&D / bilateral lower lobe pneumonia -Blood cultures grew MRSA on 07/23/2022 -Repeat blood cultures on 07/06/2022 remain negative to date -MRI of the spine was concerning for abscess, underwent I&D and laminectomy per neurosurgery on 07/06/2022 -ID consulted, TTE showed no wall motion normality, mild aortic stenosis. -ID recommends to hold off on TEE for now as repeat blood cultures are negative -Central line right IJ removed on 07/07/2022 -Vancomycin switched to daptomycin per ID  Delirium -Likely medication induced, she was started on Haldol and melatonin -Resolved  Chronic respiratory failure with hypoxemia and hypercarbia -COPD, not in exacerbation -Patient is currently on 4 L of oxygen by nasal cannula -Wean off oxygen as tolerated  Chronic diastolic heart failure -Stable  Hypertension -Hold HCTZ, continue amlodipine, Imdur  Hypovolemic hyponatremia -Sodium improved today to 129 -IV fluids have been discontinued  History of DVT -Patient is on  Xarelto at home which is currently on hold due to recent surgical intervention -Will start anticoagulation when okay with neurosurgery   Diabetes mellitus type 2 -Hemoglobin A1c greater than 10.0 in January of this year -CBG well-controlled -Continue sliding scale insulin with NovoLog, Lantus 6 units subcu twice daily     Medications     amLODipine  10 mg Oral Daily   Chlorhexidine Gluconate Cloth  6 each Topical Daily   insulin aspart  0-5 Units Subcutaneous QHS   insulin aspart  0-9 Units Subcutaneous TID WC   insulin glargine-yfgn  6 Units Subcutaneous BID   isosorbide mononitrate  60 mg Oral Daily   mupirocin ointment  1 Application Nasal BID   pantoprazole  40 mg Oral Daily   polyethylene glycol  17 g Oral Daily   senna-docusate  1 tablet Oral BID   sodium chloride flush  3 mL Intravenous Q12H   sodium chloride  2 g Oral BID WC   umeclidinium-vilanterol  1 puff Inhalation Daily     Data Reviewed:   CBG:  Recent Labs  Lab 07/07/22 2129 07/08/22 0752 07/08/22 1227 07/08/22 1355 07/08/22 1719  GLUCAP 190* 160* 156* 181* 152*    SpO2: 98 % O2 Flow Rate (L/min): 4 L/min FiO2 (%): 36 %    Vitals:   07/08/22 1200 07/08/22 1358 07/08/22 1500 07/08/22 1600  BP:  (!) 123/91  117/81  Pulse:  87 88 93  Resp:  '13 14 18  '$ Temp: 98.1 F (36.7 C) 98.2 F (36.8 C)    TempSrc: Axillary Axillary    SpO2:  96% 93% 98%  Weight:      Height:          Data Reviewed:  Basic Metabolic Panel: Recent Labs  Lab 07/16/2022 2228 07/04/22 0500 07/04/2022 0525 07/15/2022 1947 07/06/22 0438 07/07/22 0358 07/08/22 0529  NA 124* 122* 124* 127* 130* 129* 128*  K 4.3 3.8 3.8 3.7 3.8 4.7 4.9  CL 87* 85* 87* 89* 90* 96* 95*  CO2 '27 25 25 22 23 25 24  '$ GLUCOSE 292* 239* 206* 125* 91 58* 196*  BUN 28* 25* '22 20 19 19 18  '$ CREATININE 1.07* 1.00 0.86 0.98 0.94 0.94 0.86  CALCIUM 8.2* 8.1* 8.3* 8.3* 8.4* 8.1* 8.7*  MG 1.3* 1.3* 1.8  --  1.7 2.0  --   PHOS  --  3.5  --   --  3.5   --   --     CBC: Recent Labs  Lab 07/02/2022 2228 07/04/22 0500 07/17/2022 0525 07/18/2022 1947 07/06/22 0438  WBC 27.1* 25.5* 23.6* 26.3* 21.6*  NEUTROABS 25.3* 23.2*  --   --  18.9*  HGB 11.6* 11.0* 10.7* 9.5* 9.7*  HCT 35.5* 34.7* 34.0* 30.0* 29.9*  MCV 87.4 88.7 90.4 89.3 90.3  PLT PLATELET CLUMPS NOTED ON SMEAR, UNABLE TO ESTIMATE PLATELET CLUMPS NOTED ON SMEAR, UNABLE TO ESTIMATE PLATELET CLUMPS NOTED ON SMEAR, UNABLE TO ESTIMATE PLATELET CLUMPS NOTED ON SMEAR, UNABLE TO ESTIMATE PLATELET CLUMPS NOTED ON SMEAR, UNABLE TO ESTIMATE    LFT Recent Labs  Lab 07/02/2022 2228 07/04/22 0500 07/06/22 0438  AST 18 14* 24  ALT '23 20 24  '$ ALKPHOS 86 87 144*  BILITOT 1.5* 1.1 0.6  PROT 6.7 6.3* 5.6*  ALBUMIN 2.9* 2.9* 2.0*     Antibiotics: Anti-infectives (From admission, onward)    Start     Dose/Rate Route Frequency Ordered Stop   07/08/22 2000  DAPTOmycin (CUBICIN) 600 mg in sodium chloride 0.9 % IVPB        600 mg 124 mL/hr over 30 Minutes Intravenous Daily 07/08/22 1612     07/06/22 2200  vancomycin (VANCOCIN) IVPB 1000 mg/200 mL premix  Status:  Discontinued        1,000 mg 200 mL/hr over 60 Minutes Intravenous Every 36 hours 07/04/2022 0738 07/13/2022 1221   07/06/22 1000  vancomycin (VANCOREADY) IVPB 750 mg/150 mL  Status:  Discontinued        750 mg 150 mL/hr over 60 Minutes Intravenous Daily 07/06/2022 1221 07/22/2022 2031   07/06/22 0700  vancomycin (VANCOCIN) IVPB 1000 mg/200 mL premix  Status:  Discontinued        1,000 mg 200 mL/hr over 60 Minutes Intravenous Daily 07/04/2022 2031 07/08/22 1612   07/20/2022 2200  cefTRIAXone (ROCEPHIN) 2 g in sodium chloride 0.9 % 100 mL IVPB  Status:  Discontinued        2 g 200 mL/hr over 30 Minutes Intravenous Every 12 hours 07/11/2022 1932 07/06/22 1128   07/10/2022 1746  vancomycin (VANCOCIN) powder  Status:  Discontinued          As needed 07/16/2022 1747 07/20/2022 1812   07/16/2022 0900  vancomycin (VANCOREADY) IVPB 1500 mg/300 mL        1,500  mg 150 mL/hr over 120 Minutes Intravenous  Once 06/27/2022 0738 07/04/2022 1102   07/08/2022 0300  cefTRIAXone (ROCEPHIN) 2 g in sodium chloride 0.9 % 100 mL IVPB  Status:  Discontinued        2 g 200 mL/hr over 30 Minutes Intravenous Every 24 hours 07/04/22 0821 07/18/2022 1607   07/04/22 1800  cefTRIAXone (ROCEPHIN) 1 g in sodium chloride 0.9 % 100 mL IVPB  Status:  Discontinued        1 g 200 mL/hr over 30 Minutes Intravenous Every 24 hours 07/04/22 0216 07/04/22 0821   07/04/22 1800  azithromycin (ZITHROMAX) 500 mg in sodium chloride 0.9 % 250 mL IVPB  Status:  Discontinued        500 mg 250 mL/hr over 60 Minutes Intravenous Every 24 hours 07/04/22 0216 07/02/2022 1607   07/04/22 0145  cefTRIAXone (ROCEPHIN) 2 g in sodium chloride 0.9 % 100 mL IVPB        2 g 200 mL/hr over 30 Minutes Intravenous  Once 07/04/22 0136 07/04/22 0247   07/04/22 0145  azithromycin (ZITHROMAX) 500 mg in sodium chloride 0.9 % 250 mL IVPB        500 mg 250 mL/hr over 60 Minutes Intravenous  Once 07/04/22 0136 07/04/22 0544        DVT prophylaxis: SCDs  Code Status: Full code  Family Communication: Discussed with patient's son at bedside   CONSULTS    Objective    Physical Examination:   General: Appears in no acute distress Cardiovascular: S1-S2, regular, no murmur auscultated Respiratory: Lungs clear to auscultation bilaterally Abdomen: Soft, nontender, no organomegaly Extremities: No edema in the lower extremities Neurologic: Cranial nerves II through XII grossly intact, no focal deficit noted   Status is: Inpatient:             Oswald Hillock   [facility] If 7PM-7AM, please contact night-coverage at [email or web address], Office  [phone]   07/08/2022, 6:01 PM  LOS: 4 days

## 2022-07-08 NOTE — Progress Notes (Signed)
[facility]  Date of Admission:  07/18/2022     Lines:  3/10-12 Right ij central line 3/10-12 left radial a-line     Abx: 3/10-c vanc   3/10-11 ceftriaxone                                                        Assessment: 89 yo female  copd chronic 4 liters o2 at night, hx bilateral LE dvt on xarelto, dm2, htn, HFpEF, recent admission 2 weeks prior to admission for copd exacerbation, admitted 3/9 with sepsis found to have mrsa bacteremia and extensive thoracolumbar epidural abscess/om     She has no hardware/ppm present     She is s/p I&D by NSG on 3/10 -- cx mrsa 3/09 bcx mrsa 3/11 repeat bcx ngtd   3/11 tte no obvious vegetation; moderate AV calcification. Plan to defer tee if bcx remains negative on repeat and no further sepsis   Unclear source. No rash/new thrombophlebitis finding   ------- 3/13 assessment Transferred out of icu Wound cx mrsa Tolerating vanc Central line right ij removed 3/12   3/09 bcx positive 3/11 repeat bcx ngtd      Plan: Continue vanc Will look into transitioning to daptomycin for better renal side effect Will finalize abx recs in the next few days if continued improvement Discussed with primary team  I spent more than 35 minute reviewing data/chart, and coordinating care and >50% direct face to face time providing counseling/discussing diagnostics/treatment plan with patient    Principal Problem:   CAP (community acquired pneumonia) Active Problems:   Chronic diastolic CHF (congestive heart failure) ([place])   Essential hypertension   DM2 (diabetes mellitus, type 2) ([place])   COPD (chronic obstructive pulmonary disease) ([place])   Acute hyponatremia   History of DVT (deep vein thrombosis)   Anemia of chronic disease   Epidural abscess   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   Bacteremia   Vertebral osteomyelitis (HCC)   Allergies  Allergen Reactions   Buspirone Shortness Of Breath  and Other (See Comments)    "couldn't breathe"   Cephalexin Anaphylaxis, Swelling and Other (See Comments)   Micardis [Telmisartan] Other (See Comments)    Hyperkalemia during hospitalization with pulmonary edema   Atorvastatin Other (See Comments)    Myalgia    Canagliflozin Other (See Comments)    Pt does not recall reaction   Doxycycline Hyclate Nausea Only   Lipitor [Atorvastatin Calcium] Other (See Comments)    Myalgia    Tramadol Nausea Only    Scheduled Meds:  amLODipine  10 mg Oral Daily   Chlorhexidine Gluconate Cloth  6 each Topical Daily   insulin aspart  0-5 Units Subcutaneous QHS   insulin aspart  0-9 Units Subcutaneous TID WC   insulin glargine-yfgn  6 Units Subcutaneous BID   isosorbide mononitrate  60 mg Oral Daily   mupirocin ointment  1 Application Nasal BID   pantoprazole  40 mg Oral Daily   polyethylene glycol  17 g Oral Daily   senna-docusate  1 tablet Oral BID   sodium chloride flush  3 mL Intravenous Q12H   sodium chloride  2 g Oral BID WC   umeclidinium-vilanterol  1 puff Inhalation Daily   Continuous Infusions:  sodium  chloride     vancomycin 1,000 mg (07/08/22 0810)   PRN Meds:.acetaminophen **OR** acetaminophen, benzonatate, haloperidol lactate, hydrALAZINE, HYDROcodone-acetaminophen, HYDROmorphone (DILAUDID) injection, HYDROmorphone (DILAUDID) injection, ipratropium-albuterol, melatonin, menthol-cetylpyridinium **OR** phenol, methocarbamol, metoprolol tartrate, ondansetron **OR** ondansetron (ZOFRAN) IV, oxyCODONE-acetaminophen, sodium chloride flush   SUBJECTIVE: Transferred out of icu No sepsis No n/v/diarrhea/rash Wound cx grew mrsa Repeat bcx negative  Review of Systems: ROS All other ROS was negative, except mentioned above     OBJECTIVE: Vitals:   07/08/22 0600 07/08/22 0700 07/08/22 0800 07/08/22 0809  BP: 135/70 123/69    Pulse: (!) 102 (!) 105    Resp: 14 14    Temp:   98.3 F (36.8 C)   TempSrc:   Axillary   SpO2: 97%  95%  95%  Weight:      Height:       Body mass index is 33.06 kg/m.  Physical Exam General/constitutional: no distress, pleasant, sleepy HEENT: Normocephalic, PER, Conj Clear Neck: right ij wound dressing clean/dry Lungs: normal respiratory effort Abd: Soft, Nontender Skin: No Rash   Lab Results Lab Results  Component Value Date   WBC 21.6 (H) 07/06/2022   HGB 9.7 (L) 07/06/2022   HCT 29.9 (L) 07/06/2022   MCV 90.3 07/06/2022   PLT PLATELET CLUMPS NOTED ON SMEAR, UNABLE TO ESTIMATE 07/06/2022    Lab Results  Component Value Date   CREATININE 0.86 07/08/2022   BUN 18 07/08/2022   NA 128 (L) 07/08/2022   K 4.9 07/08/2022   CL 95 (L) 07/08/2022   CO2 24 07/08/2022    Lab Results  Component Value Date   ALT 24 07/06/2022   AST 24 07/06/2022   ALKPHOS 144 (H) 07/06/2022   BILITOT 0.6 07/06/2022      Microbiology: Recent Results (from the past 240 hour(s))  Resp panel by RT-PCR (RSV, Flu A&B, Covid)     Status: None   Collection Time: 07/04/22  1:23 AM  Result Value Ref Range Status   SARS Coronavirus 2 by RT PCR NEGATIVE NEGATIVE Final    Comment: (NOTE) SARS-CoV-2 target nucleic acids are NOT DETECTED.  The SARS-CoV-2 RNA is generally detectable in upper respiratory specimens during the acute phase of infection. The lowest concentration of SARS-CoV-2 viral copies this assay can detect is 138 copies/mL. A negative result does not preclude SARS-Cov-2 infection and should not be used as the sole basis for treatment or other patient management decisions. A negative result may occur with  improper specimen collection/handling, submission of specimen other than nasopharyngeal swab, presence of viral mutation(s) within the areas targeted by this assay, and inadequate number of viral copies(<138 copies/mL). A negative result must be combined with clinical observations, patient history, and epidemiological information. The expected result is Negative.  Fact Sheet  for Patients:  EntrepreneurPulse.com.au  Fact Sheet for Healthcare Providers:  IncredibleEmployment.be  This test is no t yet approved or cleared by the [place] FDA and  has been authorized for detection and/or diagnosis of SARS-CoV-2 by FDA under an Emergency Use Authorization (EUA). This EUA will remain  in effect (meaning this test can be used) for the duration of the COVID-19 declaration under Section 564(b)(1) of the Act, 21 U.S.C.section 360bbb-3(b)(1), unless the authorization is terminated  or revoked sooner.       Influenza A by PCR NEGATIVE NEGATIVE Final   Influenza B by PCR NEGATIVE NEGATIVE Final    Comment: (NOTE) The Xpert Xpress SARS-CoV-2/FLU/RSV plus assay is intended as an aid in the  diagnosis of influenza from Nasopharyngeal swab specimens and should not be used as a sole basis for treatment. Nasal washings and aspirates are unacceptable for Xpert Xpress SARS-CoV-2/FLU/RSV testing.  Fact Sheet for Patients: EntrepreneurPulse.com.au  Fact Sheet for Healthcare Providers: IncredibleEmployment.be  This test is not yet approved or cleared by the [place] FDA and has been authorized for detection and/or diagnosis of SARS-CoV-2 by FDA under an Emergency Use Authorization (EUA). This EUA will remain in effect (meaning this test can be used) for the duration of the COVID-19 declaration under Section 564(b)(1) of the Act, 21 U.S.C. section 360bbb-3(b)(1), unless the authorization is terminated or revoked.     Resp Syncytial Virus by PCR NEGATIVE NEGATIVE Final    Comment: (NOTE) Fact Sheet for Patients: EntrepreneurPulse.com.au  Fact Sheet for Healthcare Providers: IncredibleEmployment.be  This test is not yet approved or cleared by the [place] FDA and has been authorized for detection and/or diagnosis of SARS-CoV-2 by FDA under an  Emergency Use Authorization (EUA). This EUA will remain in effect (meaning this test can be used) for the duration of the COVID-19 declaration under Section 564(b)(1) of the Act, 21 U.S.C. section 360bbb-3(b)(1), unless the authorization is terminated or revoked.  Performed at [facility], [place] [address] [place], [place] [address]   Blood culture (routine x 2)     Status: Abnormal   Collection Time: 07/04/22  5:22 AM   Specimen: BLOOD  Result Value Ref Range Status   Specimen Description   Final    BLOOD LEFT ANTECUBITAL Performed at [facility] [address], [place], [place] [address]    Special Requests   Final    BOTTLES DRAWN AEROBIC AND ANAEROBIC Blood Culture results may not be optimal due to an excessive volume of blood received in culture bottles Performed at [facility] [address], [place], [place] [address]    Culture  Setup Time   Final    GRAM POSITIVE COCCI IN CLUSTERS ANAEROBIC BOTTLE ONLY CRITICAL VALUE NOTED.  VALUE IS CONSISTENT WITH PREVIOUSLY REPORTED AND CALLED VALUE.    Culture (A)  Final    STAPHYLOCOCCUS AUREUS SUSCEPTIBILITIES PERFORMED ON PREVIOUS CULTURE WITHIN THE LAST 5 DAYS. Performed at [facility], [place] [address] [place], [place] [address]    Report Status 07/07/2022 FINAL  Final  Blood culture (routine x 2)     Status: Abnormal   Collection Time: 07/04/22  6:56 AM   Specimen: BLOOD RIGHT HAND  Result Value Ref Range Status   Specimen Description   Final    BLOOD RIGHT HAND Performed at [facility], [place] [address] [place], [place] [address]    Special Requests   Final    BOTTLES DRAWN AEROBIC AND ANAEROBIC Blood Culture results may not be optimal due to an excessive volume of blood received in culture bottles Performed at [facility] [address], [place], [place] [address]    Culture  Setup Time   Final    GRAM POSITIVE COCCI IN  CLUSTERS IN BOTH AEROBIC AND ANAEROBIC BOTTLES CRITICAL RESULT CALLED TO, READ BACK BY AND VERIFIED WITH: PHARMD J. LEGGE 07/24/2022 @ 0728 BY AB Performed at [facility], [place] [address] [place],  [address]    Culture METHICILLIN RESISTANT STAPHYLOCOCCUS AUREUS (A)  Final   Report Status 07/07/2022 FINAL  Final   Organism ID, Bacteria METHICILLIN RESISTANT STAPHYLOCOCCUS AUREUS  Final  Susceptibility   Methicillin resistant staphylococcus aureus - MIC*    CIPROFLOXACIN >=8 RESISTANT Resistant     ERYTHROMYCIN >=8 RESISTANT Resistant     GENTAMICIN <=0.5 SENSITIVE Sensitive     OXACILLIN >=4 RESISTANT Resistant     TETRACYCLINE <=1 SENSITIVE Sensitive     VANCOMYCIN 1 SENSITIVE Sensitive     TRIMETH/SULFA <=10 SENSITIVE Sensitive     CLINDAMYCIN >=8 RESISTANT Resistant     RIFAMPIN <=0.5 SENSITIVE Sensitive     Inducible Clindamycin NEGATIVE Sensitive     * METHICILLIN RESISTANT STAPHYLOCOCCUS AUREUS  Blood Culture ID Panel (Reflexed)     Status: Abnormal   Collection Time: 07/04/22  6:56 AM  Result Value Ref Range Status   Enterococcus faecalis NOT DETECTED NOT DETECTED Final   Enterococcus Faecium NOT DETECTED NOT DETECTED Final   Listeria monocytogenes NOT DETECTED NOT DETECTED Final   Staphylococcus species DETECTED (A) NOT DETECTED Final    Comment: CRITICAL RESULT CALLED TO, READ BACK BY AND VERIFIED WITH: PHARMD J. LEGGE 07/08/2022 @ 0728 BY AB    Staphylococcus aureus (BCID) DETECTED (A) NOT DETECTED Final    Comment: Methicillin (oxacillin)-resistant Staphylococcus aureus (MRSA). MRSA is predictably resistant to beta-lactam antibiotics (except ceftaroline). Preferred therapy is vancomycin unless clinically contraindicated. Patient requires contact precautions if  hospitalized. CRITICAL RESULT CALLED TO, READ BACK BY AND VERIFIED WITH: PHARMD J. LEGGE 07/12/2022 @ 0728 BY AB    Staphylococcus epidermidis NOT DETECTED NOT DETECTED Final   Staphylococcus  lugdunensis NOT DETECTED NOT DETECTED Final   Streptococcus species NOT DETECTED NOT DETECTED Final   Streptococcus agalactiae NOT DETECTED NOT DETECTED Final   Streptococcus pneumoniae NOT DETECTED NOT DETECTED Final   Streptococcus pyogenes NOT DETECTED NOT DETECTED Final   A.calcoaceticus-baumannii NOT DETECTED NOT DETECTED Final   Bacteroides fragilis NOT DETECTED NOT DETECTED Final   Enterobacterales NOT DETECTED NOT DETECTED Final   Enterobacter cloacae complex NOT DETECTED NOT DETECTED Final   Escherichia coli NOT DETECTED NOT DETECTED Final   Klebsiella aerogenes NOT DETECTED NOT DETECTED Final   Klebsiella oxytoca NOT DETECTED NOT DETECTED Final   Klebsiella pneumoniae NOT DETECTED NOT DETECTED Final   Proteus species NOT DETECTED NOT DETECTED Final   Salmonella species NOT DETECTED NOT DETECTED Final   Serratia marcescens NOT DETECTED NOT DETECTED Final   Haemophilus influenzae NOT DETECTED NOT DETECTED Final   Neisseria meningitidis NOT DETECTED NOT DETECTED Final   Pseudomonas aeruginosa NOT DETECTED NOT DETECTED Final   Stenotrophomonas maltophilia NOT DETECTED NOT DETECTED Final   Candida albicans NOT DETECTED NOT DETECTED Final   Candida auris NOT DETECTED NOT DETECTED Final   Candida glabrata NOT DETECTED NOT DETECTED Final   Candida krusei NOT DETECTED NOT DETECTED Final   Candida parapsilosis NOT DETECTED NOT DETECTED Final   Candida tropicalis NOT DETECTED NOT DETECTED Final   Cryptococcus neoformans/gattii NOT DETECTED NOT DETECTED Final   Meth resistant mecA/C and MREJ DETECTED (A) NOT DETECTED Final    Comment: CRITICAL RESULT CALLED TO, READ BACK BY AND VERIFIED WITH: PHARMD J. LEGGE 07/04/2022 @ 0728 BY AB Performed at [facility], [address], [place], [place] [address]   Surgical pcr screen     Status: Abnormal   Collection Time: 07/10/2022  2:15 PM   Specimen: Nasal Mucosa; Nasal Swab  Result Value Ref Range Status   MRSA, PCR POSITIVE (A) NEGATIVE  Final    Comment: RESULT CALLED TO, READ BACK BY AND VERIFIED WITH:  C/ J. AYDELETTE,  RN 07/26/2022 1803 A. LAFRANCE    Staphylococcus aureus POSITIVE (A) NEGATIVE Final    Comment: (NOTE) The Xpert SA Assay (FDA approved for NASAL specimens in patients 33 years of age and older), is one component of a comprehensive surveillance program. It is not intended to diagnose infection nor to guide or monitor treatment. Performed at [facility], [place] [address], [place], [place] [address]   Aerobic Culture w Gram Stain (superficial specimen)     Status: None   Collection Time: 07/25/2022  5:12 PM   Specimen: Abscess  Result Value Ref Range Status   Specimen Description WOUND  Final   Special Requests  THORACIC EPIDURAL ABCESS  Final   Gram Stain   Final    RARE WBC PRESENT, PREDOMINANTLY PMN FEW GRAM POSITIVE COCCI Performed at [facility], [place] [address] [place], [place] [address]    Culture   Final    ABUNDANT METHICILLIN RESISTANT STAPHYLOCOCCUS AUREUS   Report Status 07/08/2022 FINAL  Final   Organism ID, Bacteria METHICILLIN RESISTANT STAPHYLOCOCCUS AUREUS  Final      Susceptibility   Methicillin resistant staphylococcus aureus - MIC*    CIPROFLOXACIN >=8 RESISTANT Resistant     ERYTHROMYCIN >=8 RESISTANT Resistant     GENTAMICIN <=0.5 SENSITIVE Sensitive     OXACILLIN >=4 RESISTANT Resistant     TETRACYCLINE 2 SENSITIVE Sensitive     VANCOMYCIN 1 SENSITIVE Sensitive     TRIMETH/SULFA <=10 SENSITIVE Sensitive     CLINDAMYCIN >=8 RESISTANT Resistant     RIFAMPIN <=0.5 SENSITIVE Sensitive     Inducible Clindamycin NEGATIVE Sensitive     * ABUNDANT METHICILLIN RESISTANT STAPHYLOCOCCUS AUREUS  Culture, blood (Routine X 2) w Reflex to ID Panel     Status: None (Preliminary result)   Collection Time: 07/06/22  4:38 AM   Specimen: BLOOD  Result Value Ref Range Status   Specimen Description BLOOD BLOOD RIGHT HAND  Final   Special Requests   Final    BOTTLES DRAWN AEROBIC  AND ANAEROBIC Blood Culture adequate volume   Culture   Final    NO GROWTH 2 DAYS Performed at [facility], [address], [place], [place] [address]    Report Status PENDING  Incomplete  Culture, blood (Routine X 2) w Reflex to ID Panel     Status: None (Preliminary result)   Collection Time: 07/06/22  4:38 AM   Specimen: BLOOD  Result Value Ref Range Status   Specimen Description BLOOD BLOOD RIGHT HAND  Final   Special Requests   Final    BOTTLES DRAWN AEROBIC AND ANAEROBIC Blood Culture adequate volume   Culture   Final    NO GROWTH 2 DAYS Performed at [facility], [place] [address] [place], [place] [address]    Report Status PENDING  Incomplete     Serology:   Imaging: If present, new imagings (plain films, ct scans, and mri) have been personally visualized and interpreted; radiology reports have been reviewed. Decision making incorporated into the Impression / Recommendations.  3/9 ct abd pelv with contrast 1. No acute intra-abdominal or pelvic pathology. 2. Sigmoid diverticulosis. No bowel obstruction. Normal appendix. 3. Bibasilar subpleural streaky and reticular atelectasis or infiltrate. Aspiration is not excluded clinical correlation is recommended. 4.  Aortic Atherosclerosis     3/9 cxr FINDINGS: Stable cardiomegaly. Aortic atherosclerotic calcification. Pulmonary vascular congestion. Unchanged mild left basilar airspace opacities. No definite pleural effusion. No pneumothorax. No acute osseous  abnormality. Postoperative changes right humeral head.   IMPRESSION: 1. Cardiomegaly with pulmonary vascular congestion. 2. Unchanged mild left basilar atelectasis/scarring.     3/10 mri lumbar thoracic spine wwo contrast 1. Longitudinally extensive epidural abscess extending from at least C7-T1 caudally through the L1-2. Based on the localizer images, suspect involvement of the cervical spinal canal as well. 2. Multifocal moderate to severe compression  of the spinal cord at T8-9, T10-11, T11-12. 3. Disc edema with adjacent endplate marrow edema and enhancement at T6-7, T8-9, T10-11 and T11-12. Findings are suspicious for multifocal discitis-osteomyelitis, though there is little correlate on the recent CTA chest. 4. Clumping of the cauda equina nerve roots and conus with associated leptomeningeal enhancement, consistent with superimposed arachnoiditis.   3/11 tte  1. Left ventricular ejection fraction, by estimation, is 70 to 75%. The left ventricle has hyperdynamic function. The left ventricle has no regional wall motion abnormalities. There is mild asymmetric left ventricular hypertrophy of the basal-septal  segment. Left ventricular diastolic parameters are indeterminate.   2. Right ventricular systolic function is normal. The right ventricular size is normal.   3. Left atrial size was mild to moderately dilated.   4. Right atrial size was mild to moderately dilated.   5. The mitral valve is normal in structure. No evidence of mitral valve regurgitation. No evidence of mitral stenosis.   6. The aortic valve is tricuspid. There is moderate calcification of the aortic valve. Aortic valve regurgitation is trivial. Mild aortic valve stenosis. Aortic valve area, by VTI measures 1.43 cm. Aortic valve mean gradient measures 17.0 mmHg. Aortic  valve Vmax measures 2.73 m/s.   7. The inferior vena cava is normal in size with greater than 50% respiratory variability, suggesting right atrial pressure of 3 mmHg.   8. No TTE evidence of endocarditis. If clinical suspicion is high would recommend TEE.       Jabier Mutton, [place] for Infectious [place] [phone] pager    07/08/2022, 11:03 AM

## 2022-07-09 DIAGNOSIS — J189 Pneumonia, unspecified organism: Secondary | ICD-10-CM | POA: Diagnosis not present

## 2022-07-09 DIAGNOSIS — G061 Intraspinal abscess and granuloma: Secondary | ICD-10-CM | POA: Diagnosis not present

## 2022-07-09 DIAGNOSIS — E871 Hypo-osmolality and hyponatremia: Secondary | ICD-10-CM | POA: Diagnosis not present

## 2022-07-09 DIAGNOSIS — R7881 Bacteremia: Secondary | ICD-10-CM | POA: Diagnosis not present

## 2022-07-09 LAB — OSMOLALITY: Osmolality: 271 mOsm/kg — ABNORMAL LOW (ref 275–295)

## 2022-07-09 LAB — TYPE AND SCREEN
ABO/RH(D): O POS
Antibody Screen: NEGATIVE
Unit division: 0
Unit division: 0

## 2022-07-09 LAB — GLUCOSE, CAPILLARY
Glucose-Capillary: 32 mg/dL — CL (ref 70–99)
Glucose-Capillary: 182 mg/dL — ABNORMAL HIGH (ref 70–99)
Glucose-Capillary: 151 mg/dL — ABNORMAL HIGH (ref 70–99)
Glucose-Capillary: 176 mg/dL — ABNORMAL HIGH (ref 70–99)
Glucose-Capillary: 159 mg/dL — ABNORMAL HIGH (ref 70–99)

## 2022-07-09 LAB — BASIC METABOLIC PANEL
Anion gap: 7 (ref 5–15)
BUN: 15 mg/dL (ref 8–23)
CO2: 25 mmol/L (ref 22–32)
Calcium: 8.6 mg/dL — ABNORMAL LOW (ref 8.9–10.3)
Chloride: 94 mmol/L — ABNORMAL LOW (ref 98–111)
Creatinine, Ser: 0.69 mg/dL (ref 0.44–1.00)
GFR, Estimated: >60 mL/min (ref >60)
Glucose, Bld: 146 mg/dL — ABNORMAL HIGH (ref 70–99)
Potassium: 4.1 mmol/L (ref 3.5–5.1)
Sodium: 126 mmol/L — ABNORMAL LOW (ref 135–145)

## 2022-07-09 LAB — BPAM RBC
Blood Product Expiration Date: 202404072359
Blood Product Expiration Date: 202404072359
ISSUE DATE / TIME: 202403080216
ISSUE DATE / TIME: 202403080216
Unit Type and Rh: 5100
Unit Type and Rh: 5100

## 2022-07-09 LAB — CULTURE, BLOOD (ROUTINE X 2)

## 2022-07-09 LAB — CK: Total CK: 24 U/L — ABNORMAL LOW (ref 38–234)

## 2022-07-09 MED ORDER — BRIMONIDINE TARTRATE 0.2 % OP SOLN
1.0000 [drp] | Freq: Every day | OPHTHALMIC | Status: DC
  Administered 2022-07-10 – 2022-07-19 (×10): 1 [drp] via OPHTHALMIC
  Filled 2022-07-09: qty 5

## 2022-07-09 MED ORDER — DEXTROSE 50 % IV SOLN
INTRAVENOUS | Status: AC
  Administered 2022-07-09: 50 mL
  Filled 2022-07-09: qty 50

## 2022-07-09 MED ORDER — LATANOPROST 0.005 % OP SOLN
1.0000 [drp] | Freq: Every day | OPHTHALMIC | Status: DC
  Administered 2022-07-09 – 2022-07-18 (×10): 1 [drp] via OPHTHALMIC
  Filled 2022-07-09: qty 2.5

## 2022-07-09 MED ORDER — BRINZOLAMIDE 1 % OP SUSP
1.0000 [drp] | Freq: Every day | OPHTHALMIC | Status: DC
  Administered 2022-07-10 – 2022-07-19 (×10): 1 [drp] via OPHTHALMIC
  Filled 2022-07-09 (×2): qty 10

## 2022-07-09 MED ORDER — INSULIN ASPART 100 UNIT/ML IJ SOLN
0.0000 [IU] | Freq: Three times a day (TID) | INTRAMUSCULAR | Status: DC
  Administered 2022-07-09 – 2022-07-10 (×4): 1 [IU] via SUBCUTANEOUS
  Administered 2022-07-10: 3 [IU] via SUBCUTANEOUS
  Administered 2022-07-11 (×2): 2 [IU] via SUBCUTANEOUS
  Administered 2022-07-11 – 2022-07-12 (×2): 1 [IU] via SUBCUTANEOUS
  Administered 2022-07-12: 3 [IU] via SUBCUTANEOUS
  Administered 2022-07-12: 1 [IU] via SUBCUTANEOUS
  Administered 2022-07-13: 4 [IU] via SUBCUTANEOUS
  Administered 2022-07-13 (×2): 1 [IU] via SUBCUTANEOUS
  Administered 2022-07-14: 3 [IU] via SUBCUTANEOUS
  Administered 2022-07-14: 2 [IU] via SUBCUTANEOUS
  Administered 2022-07-15 – 2022-07-16 (×5): 1 [IU] via SUBCUTANEOUS
  Administered 2022-07-17: 2 [IU] via SUBCUTANEOUS
  Administered 2022-07-18: 1 [IU] via SUBCUTANEOUS
  Administered 2022-07-19 (×2): 2 [IU] via SUBCUTANEOUS

## 2022-07-09 MED ORDER — DEXTROSE 50 % IV SOLN: 25.0000 g | INTRAVENOUS | Status: AC

## 2022-07-09 MED ORDER — METOPROLOL TARTRATE 12.5 MG HALF TABLET
12.5000 mg | ORAL_TABLET | Freq: Two times a day (BID) | ORAL | Status: DC
  Administered 2022-07-09 – 2022-07-19 (×19): 12.5 mg via ORAL
  Filled 2022-07-09 (×19): qty 1

## 2022-07-09 NOTE — TOC Progression Note (Addendum)
Transition of Care [facility]) - Progression Note    Patient Details  Name: [name] MRN: [id number] Date of Birth: June 21, 1930  Transition of [facility]) CM/SW [place], [place] Phone Number: 07/09/2022, 10:01 AM  Clinical Narrative:   CSW spoke with daughter, Victorino Sparrow, to discuss SNF options. Scottie toured [facility] yesterday but was unable to see [facility], will be going at noon today. Scottie to update CSW on SNF choice after touring. CSW to follow.  UPDATE: CSW heard back from daughter, Victorino Sparrow, after tour and she would like to choose [facility]. CSW contacted [facility] to accept bed offer. CSW to follow.    Expected Discharge Plan: Skilled Nursing Facility Barriers to Discharge: SNF Pending bed offer, Continued Medical Work up  Expected Discharge Plan and Services In-house Referral: Clinical Social Work     Living arrangements for the past 2 months: Single Family Home                                       Social Determinants of Health (SDOH) Interventions SDOH Screenings   Food Insecurity: No Food Insecurity (07/04/2022)  Housing: Low Risk  (07/04/2022)  Transportation Needs: No Transportation Needs (07/04/2022)  Utilities: Not At Risk (07/04/2022)  Tobacco Use: Low Risk  (07/05/2022)    Readmission Risk Interventions    07/06/2022    4:11 PM 05/07/2022    3:17 PM  Readmission Risk Prevention Plan  Transportation Screening Complete Complete  PCP or Specialist Appt within 3-5 Days  Complete  HRI or [place]  Complete  Social Work Consult for [place] Planning/Counseling  [place]  Not Applicable  Medication Review [employer]) Complete Complete  PCP or Specialist appointment within 3-5 days of discharge Complete   HRI or [place] Complete   SW Recovery Care/Counseling Consult Complete   Palliative Care Screening Not Applicable   Skilled Nursing Facility Complete

## 2022-07-09 NOTE — Progress Notes (Signed)
Mobility Specialist: Progress Note   07/09/22 1700  Mobility  Activity Transferred from chair to bed  Level of Assistance Moderate assist, patient does 50-74%  Assistive Device Front wheel walker  Distance Ambulated (ft) 2 ft  Activity Response Tolerated well  Mobility Referral Yes  $Mobility charge 1 Mobility   Pt assisted back to bed per request. ModA +2 to stand from the chair. Contact guard during transfer. Pt bowel incontinent during transfer. Assisted back in bed with RN present to help with pericare.   Selma Wynter Grave Mobility Specialist Please contact via SecureChat or Rehab office at [phone]

## 2022-07-09 NOTE — Significant Event (Signed)
Hypoglycemic Event  CBG: 32  Treatment: D50 50 mL (25 gm)  Symptoms:  drowsy, arousable to tactile stimuli  Follow-up CBG: [id number] CBG Result:182  Possible Reasons for Event: Inadequate meal intake and Medication regimen: scheduled semglee insulin  Comments/MD notified:Carole Hall, DO new orders noted    Sallye Lat

## 2022-07-09 NOTE — Significant Event (Signed)
Assisted patient to chair; patient stood up and took a few steps with a walker, was eager to get out of bed this afternoon. Sat in the recliner for several hours today. Patient is very hard of hearing; can get very restless when hot or cold. Mentation have improved as the day progressed.

## 2022-07-09 NOTE — Progress Notes (Signed)
   07/08/22 2315  Assess: MEWS Score  Temp 98 F (36.7 C)  BP (!) 133/59  MAP (mmHg) 80  Pulse Rate (!) 128  Resp 18  Level of Consciousness Alert  SpO2 96 %  O2 Device Nasal Cannula  O2 Flow Rate (L/min) 4 L/min  Assess: MEWS Score  MEWS Temp 0  MEWS Systolic 0  MEWS Pulse 2  MEWS RR 0  MEWS LOC 0  MEWS Score 2  MEWS Score Color Yellow  Assess: if the MEWS score is Yellow or Red  Were vital signs taken at a resting state? Yes  Focused Assessment No change from prior assessment  Does the patient meet 2 or more of the SIRS criteria? No  Does the patient have a confirmed or suspected source of infection? Yes  Provider and Rapid Response Notified? No  MEWS guidelines implemented  Yes, yellow  Treat  MEWS Interventions Considered administering scheduled or prn medications/treatments as ordered  Take Vital Signs  Increase Vital Sign Frequency  Yellow: Q2hr x1, continue Q4hrs until patient remains green for 12hrs  Escalate  MEWS: Escalate Yellow: Discuss with charge nurse and consider notifying provider and/or RRT  Notify: Charge Nurse/RN  Name of Charge Nurse/RN Notified Phil,RN  Assess: SIRS CRITERIA  SIRS Temperature  0  SIRS Pulse 1  SIRS Respirations  0  SIRS WBC 0  SIRS Score Sum  1   Pt noted to be anxious, restless, and yelling out for her son at this time. HR fluctuating between 120s-140s. With recheck of HR at rest sustaining in 120s. Pt confused, requiring frequent reorientation and redirection by staff. PRN metoprolol 5 mg given IV and melatonin for sleep. Will monitor for effectiveness.

## 2022-07-09 NOTE — Progress Notes (Signed)
[facility]  PROGRESS NOTE  [name] [id number] DOB: 1930-07-04 DOA: 07/07/2022 PCP: [name], DO   Brief HPI:   89 year old female with past medical history of chronic hypoxemic respiratory failure on 4 L of oxygen, chronic diastolic heart failure, diabetes mellitus type 2, DVT on Xarelto admitted to [facility] long hospital on 07/04/2022 for right-sided chest discomfort, found to be septic due to pneumonia, started on Rocephin and Zithromax.  On 07/15/2022 blood culture positive for MRSA, imaging showed epidural abscess with cord compression, disc edema.  Neurosurgery was consulted, patient was transferred to [facility] for incision and drainage of T9, T10 and T11, surgical intervention with laminectomy.    Subjective   Patient seen and examined, has been intermittently confused this morning.   Assessment/Plan:    Sepsis due to MRSA bacteremia -Extensive abscess of thoracic lumbar spine, T9, T10, T11 -S/p laminectomy and I&D / bilateral lower lobe pneumonia -Blood cultures grew MRSA on 07/04/2022 -Repeat blood cultures on 07/06/2022 remain negative to date -MRI of the spine was concerning for abscess, underwent I&D and laminectomy per neurosurgery on 07/14/2022 -ID consulted, TTE showed no wall motion normality, mild aortic stenosis. -ID recommends to hold off on TEE for now as repeat blood cultures are negative -Central line right IJ removed on 07/07/2022 -Vancomycin switched to daptomycin per ID  Delirium -Likely medication induced, she was started on Haldol and melatonin -Continue delirium precautions  Chronic respiratory failure with hypoxemia and hypercarbia -COPD, not in exacerbation -Patient is currently on 5 L of oxygen by nasal cannula -Wean off oxygen as tolerated  Chronic diastolic heart failure -Stable  Hypertension -Hold HCTZ, continue amlodipine, Imdur  Hypovolemic hyponatremia -Sodium is again down to 126 -Check serum and urine osmolality -Start fluid  restriction 1.5 L/day  History of DVT -Patient is on Xarelto at home which is currently on hold due to recent surgical intervention -Will start anticoagulation when okay with neurosurgery   Diabetes mellitus type 2 -Hemoglobin A1c greater than 10.0 in January of this year -CBG well-controlled, has been on lower side -Continue very sensitive sliding scale with NovoLog      Medications     amLODipine  10 mg Oral Daily   Chlorhexidine Gluconate Cloth  6 each Topical Daily   Gerhardt's butt cream   Topical Daily   insulin aspart  0-5 Units Subcutaneous QHS   insulin aspart  0-9 Units Subcutaneous TID WC   isosorbide mononitrate  60 mg Oral Daily   mupirocin ointment  1 Application Nasal BID   pantoprazole  40 mg Oral Daily   polyethylene glycol  17 g Oral Daily   senna-docusate  1 tablet Oral BID   sodium chloride flush  3 mL Intravenous Q12H   sodium chloride  2 g Oral BID WC   umeclidinium-vilanterol  1 puff Inhalation Daily     Data Reviewed:   CBG:  Recent Labs  Lab 07/08/22 1355 07/08/22 1719 07/08/22 2122 07/09/22 0616 07/09/22 0632  GLUCAP 181* 152* 80 32* 182*    SpO2: 94 % O2 Flow Rate (L/min): 5 L/min FiO2 (%): 36 %    Vitals:   07/09/22 0459 07/09/22 0500 07/09/22 0715 07/09/22 0800  BP:  (!) 124/59  129/80  Pulse:  63 83 98  Resp:  '10 17 15  '$ Temp:    (!) 97 F (36.1 C)  TempSrc:    Oral  SpO2:  98% 96% 94%  Weight: 92.2 kg     Height:  Data Reviewed:  Basic Metabolic Panel: Recent Labs  Lab 07/10/2022 2228 07/04/22 0500 07/01/2022 0525 07/18/2022 1947 07/06/22 0438 07/07/22 0358 07/08/22 0529 07/09/22 0625  NA 124* 122* 124* 127* 130* 129* 128* 126*  K 4.3 3.8 3.8 3.7 3.8 4.7 4.9 4.1  CL 87* 85* 87* 89* 90* 96* 95* 94*  CO2 '27 25 25 22 23 25 24 25  '$ GLUCOSE 292* 239* 206* 125* 91 58* 196* 146*  BUN 28* 25* '22 20 19 19 18 15  '$ CREATININE 1.07* 1.00 0.86 0.98 0.94 0.94 0.86 0.69  CALCIUM 8.2* 8.1* 8.3* 8.3* 8.4* 8.1* 8.7*  8.6*  MG 1.3* 1.3* 1.8  --  1.7 2.0  --   --   PHOS  --  3.5  --   --  3.5  --   --   --     CBC: Recent Labs  Lab 06/28/2022 2228 07/04/22 0500 07/10/2022 0525 07/25/2022 1947 07/06/22 0438  WBC 27.1* 25.5* 23.6* 26.3* 21.6*  NEUTROABS 25.3* 23.2*  --   --  18.9*  HGB 11.6* 11.0* 10.7* 9.5* 9.7*  HCT 35.5* 34.7* 34.0* 30.0* 29.9*  MCV 87.4 88.7 90.4 89.3 90.3  PLT PLATELET CLUMPS NOTED ON SMEAR, UNABLE TO ESTIMATE PLATELET CLUMPS NOTED ON SMEAR, UNABLE TO ESTIMATE PLATELET CLUMPS NOTED ON SMEAR, UNABLE TO ESTIMATE PLATELET CLUMPS NOTED ON SMEAR, UNABLE TO ESTIMATE PLATELET CLUMPS NOTED ON SMEAR, UNABLE TO ESTIMATE    LFT Recent Labs  Lab 07/24/2022 2228 07/04/22 0500 07/06/22 0438  AST 18 14* 24  ALT '23 20 24  '$ ALKPHOS 86 87 144*  BILITOT 1.5* 1.1 0.6  PROT 6.7 6.3* 5.6*  ALBUMIN 2.9* 2.9* 2.0*     Antibiotics: Anti-infectives (From admission, onward)    Start     Dose/Rate Route Frequency Ordered Stop   07/08/22 2000  DAPTOmycin (CUBICIN) 600 mg in sodium chloride 0.9 % IVPB        600 mg 124 mL/hr over 30 Minutes Intravenous Daily 07/08/22 1612     07/06/22 2200  vancomycin (VANCOCIN) IVPB 1000 mg/200 mL premix  Status:  Discontinued        1,000 mg 200 mL/hr over 60 Minutes Intravenous Every 36 hours 07/25/2022 0738 07/16/2022 1221   07/06/22 1000  vancomycin (VANCOREADY) IVPB 750 mg/150 mL  Status:  Discontinued        750 mg 150 mL/hr over 60 Minutes Intravenous Daily 07/08/2022 1221 07/04/2022 2031   07/06/22 0700  vancomycin (VANCOCIN) IVPB 1000 mg/200 mL premix  Status:  Discontinued        1,000 mg 200 mL/hr over 60 Minutes Intravenous Daily 07/18/2022 2031 07/08/22 1612   07/08/2022 2200  cefTRIAXone (ROCEPHIN) 2 g in sodium chloride 0.9 % 100 mL IVPB  Status:  Discontinued        2 g 200 mL/hr over 30 Minutes Intravenous Every 12 hours 07/16/2022 1932 07/06/22 1128   07/07/2022 1746  vancomycin (VANCOCIN) powder  Status:  Discontinued          As needed 07/06/2022 1747  07/11/2022 1812   07/01/2022 0900  vancomycin (VANCOREADY) IVPB 1500 mg/300 mL        1,500 mg 150 mL/hr over 120 Minutes Intravenous  Once 06/26/2022 0738 07/18/2022 1102   07/18/2022 0300  cefTRIAXone (ROCEPHIN) 2 g in sodium chloride 0.9 % 100 mL IVPB  Status:  Discontinued        2 g 200 mL/hr over 30 Minutes Intravenous Every 24 hours 07/04/22 0821 07/17/2022 1607  07/04/22 1800  cefTRIAXone (ROCEPHIN) 1 g in sodium chloride 0.9 % 100 mL IVPB  Status:  Discontinued        1 g 200 mL/hr over 30 Minutes Intravenous Every 24 hours 07/04/22 0216 07/04/22 0821   07/04/22 1800  azithromycin (ZITHROMAX) 500 mg in sodium chloride 0.9 % 250 mL IVPB  Status:  Discontinued        500 mg 250 mL/hr over 60 Minutes Intravenous Every 24 hours 07/04/22 0216 07/20/2022 1607   07/04/22 0145  cefTRIAXone (ROCEPHIN) 2 g in sodium chloride 0.9 % 100 mL IVPB        2 g 200 mL/hr over 30 Minutes Intravenous  Once 07/04/22 0136 07/04/22 0247   07/04/22 0145  azithromycin (ZITHROMAX) 500 mg in sodium chloride 0.9 % 250 mL IVPB        500 mg 250 mL/hr over 60 Minutes Intravenous  Once 07/04/22 0136 07/04/22 0544        DVT prophylaxis: SCDs  Code Status: Full code  Family Communication: Discussed with patient's son at bedside   CONSULTS    Objective    Physical Examination:  Appears in no acute distress S1-S2, regular, no murmur auscultated Lungs clear to auscultation bilaterally Abdomen is soft, nontender, no organomegaly   Status is: Inpatient:             Oswald Hillock   [facility] If 7PM-7AM, please contact night-coverage at [email or web address], Office  [phone]   07/09/2022, 10:02 AM  LOS: 5 days

## 2022-07-09 NOTE — Progress Notes (Signed)
Overall progressing well.  Pain better controlled.  No new lower extremity sympto she is afebrile.  Her vital signs are stable.  Ms.  Patient was able to be out of bed yesterday.  Patient is afebrile.  Her vital signs are stable.  She is oxygenating well on room air.  She is awake and aware.  She is less confused and more appropriate.  Motor examination of her lower extremities reveals good voluntary strength in both lower extremities with good dorsiflexion plantarflexion bilaterally.  Wound clean and dry.  Status post thoracic laminectomy for evacuation of epidural abscess.  Overall progressing about as well as could be hoped.  Continue efforts at mobilization and continue IV antibiotics.

## 2022-07-10 DIAGNOSIS — R7881 Bacteremia: Secondary | ICD-10-CM | POA: Diagnosis not present

## 2022-07-10 DIAGNOSIS — G062 Extradural and subdural abscess, unspecified: Secondary | ICD-10-CM | POA: Diagnosis not present

## 2022-07-10 DIAGNOSIS — G061 Intraspinal abscess and granuloma: Secondary | ICD-10-CM | POA: Diagnosis not present

## 2022-07-10 DIAGNOSIS — M4620 Osteomyelitis of vertebra, site unspecified: Secondary | ICD-10-CM | POA: Diagnosis not present

## 2022-07-10 DIAGNOSIS — E871 Hypo-osmolality and hyponatremia: Secondary | ICD-10-CM | POA: Diagnosis not present

## 2022-07-10 DIAGNOSIS — J189 Pneumonia, unspecified organism: Secondary | ICD-10-CM | POA: Diagnosis not present

## 2022-07-10 DIAGNOSIS — B9562 Methicillin resistant Staphylococcus aureus infection as the cause of diseases classified elsewhere: Secondary | ICD-10-CM | POA: Diagnosis not present

## 2022-07-10 LAB — CBC
HCT: 25.8 % — ABNORMAL LOW (ref 36.0–46.0)
Hemoglobin: 8.3 g/dL — ABNORMAL LOW (ref 12.0–15.0)
MCH: 28.2 pg (ref 26.0–34.0)
MCHC: 32.2 g/dL (ref 30.0–36.0)
MCV: 87.8 fL (ref 80.0–100.0)
Platelets: 154 10*3/uL (ref 150–400)
RBC: 2.94 MIL/uL — ABNORMAL LOW (ref 3.87–5.11)
RDW: 15.1 % (ref 11.5–15.5)
WBC: 13.6 10*3/uL — ABNORMAL HIGH (ref 4.0–10.5)
nRBC: 0.2 % (ref 0.0–0.2)

## 2022-07-10 LAB — BASIC METABOLIC PANEL
Anion gap: 7 (ref 5–15)
BUN: 12 mg/dL (ref 8–23)
CO2: 26 mmol/L (ref 22–32)
Calcium: 8.3 mg/dL — ABNORMAL LOW (ref 8.9–10.3)
Chloride: 93 mmol/L — ABNORMAL LOW (ref 98–111)
Creatinine, Ser: 0.89 mg/dL (ref 0.44–1.00)
GFR, Estimated: >60 mL/min (ref >60)
Glucose, Bld: 188 mg/dL — ABNORMAL HIGH (ref 70–99)
Potassium: 4.6 mmol/L (ref 3.5–5.1)
Sodium: 126 mmol/L — ABNORMAL LOW (ref 135–145)

## 2022-07-10 LAB — GLUCOSE, CAPILLARY
Glucose-Capillary: 183 mg/dL — ABNORMAL HIGH (ref 70–99)
Glucose-Capillary: 200 mg/dL — ABNORMAL HIGH (ref 70–99)
Glucose-Capillary: 267 mg/dL — ABNORMAL HIGH (ref 70–99)
Glucose-Capillary: 186 mg/dL — ABNORMAL HIGH (ref 70–99)

## 2022-07-10 LAB — C-REACTIVE PROTEIN: CRP: 17.1 mg/dL — ABNORMAL HIGH (ref <1.0)

## 2022-07-10 NOTE — Progress Notes (Addendum)
[facility]  PROGRESS NOTE  [name] [id number] DOB: 03-Nov-1930 DOA: 07/25/2022 PCP: [name], DO   Brief HPI:   89 year old female with past medical history of chronic hypoxemic respiratory failure on 4 L of oxygen, chronic diastolic heart failure, diabetes mellitus type 2, DVT on Xarelto admitted to [facility] long hospital on 07/04/2022 for right-sided chest discomfort, found to be septic due to pneumonia, started on Rocephin and Zithromax.  On 07/15/2022 blood culture positive for MRSA, imaging showed epidural abscess with cord compression, disc edema.  Neurosurgery was consulted, patient was transferred to [facility] for incision and drainage of T9, T10 and T11, surgical intervention with laminectomy.    Subjective   Patient seen, denies pain.  Denies shortness of breath.   Assessment/Plan:    Sepsis due to MRSA bacteremia -Extensive abscess of thoracic lumbar spine, T9, T10, T11 -S/p laminectomy and I&D / bilateral lower lobe pneumonia -Blood cultures grew MRSA on 07/26/2022 -Repeat blood cultures on 07/06/2022 remain negative to date -MRI of the spine was concerning for abscess, underwent I&D and laminectomy per neurosurgery on 07/01/2022 -ID consulted, TTE showed no wall motion normality, mild aortic stenosis. -ID recommends to hold off on TEE for now as repeat blood cultures are negative -Central line right IJ removed on 07/07/2022 -Vancomycin switched to daptomycin per ID  Delirium -Likely medication induced,  -she was started on Haldol and melatonin -Continue delirium precautions  Chronic respiratory failure with hypoxemia and hypercarbia -COPD, not in exacerbation -Patient is currently on 2 L of oxygen by nasal cannula -Wean off oxygen as tolerated  Chronic diastolic heart failure -Stable  Hypertension -Hold HCTZ, continue amlodipine, Imdur  Hypovolemic hyponatremia -Sodium is again down to 126 -serum osmolality: 271 -continue fluid restriction 1.5  L/day -Follow urine osmolality  History of DVT -Patient is on Xarelto at home which is currently on hold due to recent surgical intervention -Will start anticoagulation when okay with neurosurgery   Diabetes mellitus type 2 -Hemoglobin A1c greater than 10.0 in January of this year -CBG well-controlled, has been on lower side -Continue very sensitive sliding scale with NovoLog      Medications     amLODipine  10 mg Oral Daily   brimonidine  1 drop Both Eyes Daily   brinzolamide  1 drop Both Eyes Daily   Chlorhexidine Gluconate Cloth  6 each Topical Daily   Gerhardt's butt cream   Topical Daily   insulin aspart  0-6 Units Subcutaneous TID WC   isosorbide mononitrate  60 mg Oral Daily   latanoprost  1 drop Both Eyes QHS   metoprolol tartrate  12.5 mg Oral BID   mupirocin ointment  1 Application Nasal BID   pantoprazole  40 mg Oral Daily   polyethylene glycol  17 g Oral Daily   senna-docusate  1 tablet Oral BID   sodium chloride flush  3 mL Intravenous Q12H   sodium chloride  2 g Oral BID WC   umeclidinium-vilanterol  1 puff Inhalation Daily     Data Reviewed:   CBG:  Recent Labs  Lab 07/09/22 0632 07/09/22 1143 07/09/22 1607 07/09/22 2121 07/10/22 0612  GLUCAP 182* 151* 176* 159* 183*    SpO2: 92 % O2 Flow Rate (L/min): 2 L/min FiO2 (%): 36 %    Vitals:   07/09/22 1928 07/09/22 2308 07/10/22 0347 07/10/22 0727  BP: 127/62 128/65 (!) 132/59 138/73  Pulse: 100 (!) 102 80 88  Resp: 19 19 19 17   Temp: 98.4 F (36.9  C) 98.4 F (36.9 C) 97.9 F (36.6 C) 98.7 F (37.1 C)  TempSrc: Axillary Oral Oral Oral  SpO2: 98% 98% 98% 92%  Weight:      Height:          Data Reviewed:  Basic Metabolic Panel: Recent Labs  Lab 07/06/2022 2228 07/04/22 0500 07/18/2022 0525 07/20/2022 1947 07/06/22 0438 07/07/22 0358 07/08/22 0529 07/09/22 0625 07/10/22 0428  NA 124* 122* 124*   < > 130* 129* 128* 126* 126*  K 4.3 3.8 3.8   < > 3.8 4.7 4.9 4.1 4.6  CL 87* 85*  87*   < > 90* 96* 95* 94* 93*  CO2 27 25 25    < > 23 25 24 25 26   GLUCOSE 292* 239* 206*   < > 91 58* 196* 146* 188*  BUN 28* 25* 22   < > 19 19 18 15 12   CREATININE 1.07* 1.00 0.86   < > 0.94 0.94 0.86 0.69 0.89  CALCIUM 8.2* 8.1* 8.3*   < > 8.4* 8.1* 8.7* 8.6* 8.3*  MG 1.3* 1.3* 1.8  --  1.7 2.0  --   --   --   PHOS  --  3.5  --   --  3.5  --   --   --   --    < > = values in this interval not displayed.    CBC: Recent Labs  Lab 07/06/2022 2228 07/04/22 0500 06/27/2022 0525 07/16/2022 1947 07/06/22 0438 07/10/22 0428  WBC 27.1* 25.5* 23.6* 26.3* 21.6* 13.6*  NEUTROABS 25.3* 23.2*  --   --  18.9*  --   HGB 11.6* 11.0* 10.7* 9.5* 9.7* 8.3*  HCT 35.5* 34.7* 34.0* 30.0* 29.9* 25.8*  MCV 87.4 88.7 90.4 89.3 90.3 87.8  PLT PLATELET CLUMPS NOTED ON SMEAR, UNABLE TO ESTIMATE PLATELET CLUMPS NOTED ON SMEAR, UNABLE TO ESTIMATE PLATELET CLUMPS NOTED ON SMEAR, UNABLE TO ESTIMATE PLATELET CLUMPS NOTED ON SMEAR, UNABLE TO ESTIMATE PLATELET CLUMPS NOTED ON SMEAR, UNABLE TO ESTIMATE 154    LFT Recent Labs  Lab 07/20/2022 2228 07/04/22 0500 07/06/22 0438  AST 18 14* 24  ALT 23 20 24   ALKPHOS 86 87 144*  BILITOT 1.5* 1.1 0.6  PROT 6.7 6.3* 5.6*  ALBUMIN 2.9* 2.9* 2.0*     Antibiotics: Anti-infectives (From admission, onward)    Start     Dose/Rate Route Frequency Ordered Stop   07/08/22 2000  DAPTOmycin (CUBICIN) 600 mg in sodium chloride 0.9 % IVPB        600 mg 124 mL/hr over 30 Minutes Intravenous Daily 07/08/22 1612     07/06/22 2200  vancomycin (VANCOCIN) IVPB 1000 mg/200 mL premix  Status:  Discontinued        1,000 mg 200 mL/hr over 60 Minutes Intravenous Every 36 hours 07/15/2022 0738 07/24/2022 1221   07/06/22 1000  vancomycin (VANCOREADY) IVPB 750 mg/150 mL  Status:  Discontinued        750 mg 150 mL/hr over 60 Minutes Intravenous Daily 06/29/2022 1221 06/29/2022 2031   07/06/22 0700  vancomycin (VANCOCIN) IVPB 1000 mg/200 mL premix  Status:  Discontinued        1,000 mg 200 mL/hr  over 60 Minutes Intravenous Daily 07/21/2022 2031 07/08/22 1612   07/25/2022 2200  cefTRIAXone (ROCEPHIN) 2 g in sodium chloride 0.9 % 100 mL IVPB  Status:  Discontinued        2 g 200 mL/hr over 30 Minutes Intravenous Every 12 hours 07/22/2022 1932 07/06/22  1128   07/20/2022 1746  vancomycin (VANCOCIN) powder  Status:  Discontinued          As needed 07/22/2022 1747 07/12/2022 1812   06/27/2022 0900  vancomycin (VANCOREADY) IVPB 1500 mg/300 mL        1,500 mg 150 mL/hr over 120 Minutes Intravenous  Once 07/09/2022 0738 07/22/2022 1102   07/09/2022 0300  cefTRIAXone (ROCEPHIN) 2 g in sodium chloride 0.9 % 100 mL IVPB  Status:  Discontinued        2 g 200 mL/hr over 30 Minutes Intravenous Every 24 hours 07/04/22 0821 06/29/2022 1607   07/04/22 1800  cefTRIAXone (ROCEPHIN) 1 g in sodium chloride 0.9 % 100 mL IVPB  Status:  Discontinued        1 g 200 mL/hr over 30 Minutes Intravenous Every 24 hours 07/04/22 0216 07/04/22 0821   07/04/22 1800  azithromycin (ZITHROMAX) 500 mg in sodium chloride 0.9 % 250 mL IVPB  Status:  Discontinued        500 mg 250 mL/hr over 60 Minutes Intravenous Every 24 hours 07/04/22 0216 07/11/2022 1607   07/04/22 0145  cefTRIAXone (ROCEPHIN) 2 g in sodium chloride 0.9 % 100 mL IVPB        2 g 200 mL/hr over 30 Minutes Intravenous  Once 07/04/22 0136 07/04/22 0247   07/04/22 0145  azithromycin (ZITHROMAX) 500 mg in sodium chloride 0.9 % 250 mL IVPB        500 mg 250 mL/hr over 60 Minutes Intravenous  Once 07/04/22 0136 07/04/22 0544        DVT prophylaxis: SCDs  Code Status: Full code  Family Communication: Discussed with patient's son at bedside   CONSULTS    Objective    Physical Examination:  Appears in no acute distress S1-S2, regular, no murmur auscultated Lungs clear to auscultation bilaterally Abdomen is soft, nontender, no organomegaly Neuro-alert, oriented x 3, no focal deficit noted   Status is: Inpatient:             Oswald Hillock   [facility] If 7PM-7AM, please contact night-coverage at [email or web address], Office  [phone]   07/10/2022, 7:54 AM  LOS: 6 days

## 2022-07-10 NOTE — Progress Notes (Signed)
Mobility Specialist: Progress Note   07/10/22 1208  Mobility  Activity Transferred to/from [facility]  Level of Assistance +2 (takes two people)  Assistive Device Stedy  Activity Response Tolerated poorly  Mobility Referral Yes  $Mobility charge 1 Mobility   Pt received in the bed and agreeable to mobility. ModA with bed mobility as well as to stand. Attempted to pivot to [facility]. Pt prematurely sitting and not following commands for standing and weight shifting. Unable to stand and adjust on [facility] resulting in controlled descent to the floor. RN present in the room. +2 maxA to assist pt to [facility]. +2 modA with Stedy to transfer to the chair once finished. No c/o pain. Pt is in the chair with call bell in her lap. Chair alarm is on.   Dunbar Khalise Billard Mobility Specialist Please contact via SecureChat or Rehab office at [phone]

## 2022-07-10 NOTE — Progress Notes (Signed)
Overall stable. Cont Abx. Mobilize ad lib.  Call for porblems

## 2022-07-10 NOTE — Progress Notes (Signed)
[facility]  Date of Admission:  07/17/2022     Lines:  3/10-12 Right ij central line 3/10-12 left radial a-line     Abx: 3/13-c dapto  3/10-13 vanc 3/10-11 ceftriaxone                                                        Assessment: 89 yo female  copd chronic 4 liters o2 at night, hx bilateral LE dvt on xarelto, dm2, htn, HFpEF, recent admission 2 weeks prior to admission for copd exacerbation, admitted 3/9 with sepsis found to have mrsa bacteremia and extensive thoracolumbar epidural abscess/om     She has no hardware/ppm present     She is s/p I&D (o hardware placed) by NSG on 3/10 -- cx mrsa 3/09 bcx mrsa (S tetra/bactrim) 3/11 repeat bcx ngtd   3/11 tte no obvious vegetation; moderate AV calcification. Plan to defer tee if bcx remains negative on repeat and no further sepsis   Unclear source. No rash/new thrombophlebitis finding   ------- 3/15 assessment Doing well, up in chair Eating well Pending dispo to snf Tolerating daptomycin  3/09 bcx positive 3/11 repeat bcx ngtd  Will plan at least 8 weeks of abx, the first 4 weeks would keep on IV abx. Will see in ID clinic and see if can transition to oral linezolid for the next 2 weeks then doxycycline after 6 weeks of iv dapto/linezolid sequence   Plan: Continue daptomycin Snf orders with daptomycin Weekly cbc, cmp, crp and cpk Fax weekly labs to [phone] 3.   Ok to discharge from id standpoint if ok with nsg and primary team 4.   Discussed with patient and her family 5.   Discussed with primary team   Clinic Follow Up Appt: 4/12 @ 930  @  [facility] [place], [place], [place] [address] Phone: [phone]  I spent more than 50 minute reviewing data/chart, and coordinating care and >50% direct face to face time providing counseling/discussing diagnostics/treatment plan with patient   Principal Problem:   CAP (community acquired  pneumonia) Active Problems:   Chronic diastolic CHF (congestive heart failure) ([place])   Essential hypertension   DM2 (diabetes mellitus, type 2) (HCC)   COPD (chronic obstructive pulmonary disease) ([place])   Acute hyponatremia   History of DVT (deep vein thrombosis)   Anemia of chronic disease   Epidural abscess   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   Bacteremia   Vertebral osteomyelitis (HCC)   Allergies  Allergen Reactions   Buspirone Shortness Of Breath and Other (See Comments)    "couldn't breathe"   Cephalexin Anaphylaxis, Swelling and Other (See Comments)   Micardis [Telmisartan] Other (See Comments)    Hyperkalemia during hospitalization with pulmonary edema   Atorvastatin Other (See Comments)    Myalgia    Canagliflozin Other (See Comments)    Pt does not recall reaction   Doxycycline Hyclate Nausea Only   Lipitor [Atorvastatin Calcium] Other (See Comments)    Myalgia    Tramadol Nausea Only    Scheduled Meds:  amLODipine  10 mg Oral Daily   brimonidine  1 drop Both Eyes Daily   brinzolamide  1 drop Both Eyes Daily   Chlorhexidine Gluconate Cloth  6  each Topical Daily   Gerhardt's butt cream   Topical Daily   insulin aspart  0-6 Units Subcutaneous TID WC   isosorbide mononitrate  60 mg Oral Daily   latanoprost  1 drop Both Eyes QHS   metoprolol tartrate  12.5 mg Oral BID   pantoprazole  40 mg Oral Daily   polyethylene glycol  17 g Oral Daily   senna-docusate  1 tablet Oral BID   sodium chloride flush  3 mL Intravenous Q12H   sodium chloride  2 g Oral BID WC   umeclidinium-vilanterol  1 puff Inhalation Daily   Continuous Infusions:  sodium chloride     DAPTOmycin (CUBICIN) 600 mg in sodium chloride 0.9 % IVPB Stopped (07/09/22 2217)   PRN Meds:.acetaminophen **OR** acetaminophen, benzonatate, haloperidol lactate, hydrALAZINE, HYDROcodone-acetaminophen, HYDROmorphone (DILAUDID) injection, HYDROmorphone (DILAUDID) injection, ipratropium-albuterol,  melatonin, menthol-cetylpyridinium **OR** phenol, methocarbamol, metoprolol tartrate, ondansetron **OR** ondansetron (ZOFRAN) IV, oxyCODONE-acetaminophen, sodium chloride flush   SUBJECTIVE: Up in chair Eating well Back pain 5/10 better since surgery No fever chill Some loose stools No n/v/rash  Pending dispo to snf within a week  Review of Systems: ROS All other ROS was negative, except mentioned above     OBJECTIVE: Vitals:   07/09/22 2308 07/10/22 0347 07/10/22 0727 07/10/22 1122  BP: 128/65 (!) 132/59 138/73 104/74  Pulse: (!) 102 80 88 91  Resp: 19 19 17 18   Temp: 98.4 F (36.9 C) 97.9 F (36.6 C) 98.7 F (37.1 C) 97.8 F (36.6 C)  TempSrc: Oral Oral Oral Oral  SpO2: 98% 98% 92% 94%  Weight:      Height:       Body mass index is 34.87 kg/m.  Physical Exam General/constitutional: no distress, pleasant; eating lunch HEENT: Normocephalic, PER, Conj Clear, EOMI CV: rrr no mrg Lungs: normal respiratory effort Ext: trace bilateral LE edema Skin: No Rash Neuro: nonfocal MSK: no peripheral joint swelling/tenderness/warmth   Lab Results Lab Results  Component Value Date   WBC 13.6 (H) 07/10/2022   HGB 8.3 (L) 07/10/2022   HCT 25.8 (L) 07/10/2022   MCV 87.8 07/10/2022   PLT 154 07/10/2022    Lab Results  Component Value Date   CREATININE 0.89 07/10/2022   BUN 12 07/10/2022   NA 126 (L) 07/10/2022   K 4.6 07/10/2022   CL 93 (L) 07/10/2022   CO2 26 07/10/2022    Lab Results  Component Value Date   ALT 24 07/06/2022   AST 24 07/06/2022   ALKPHOS 144 (H) 07/06/2022   BILITOT 0.6 07/06/2022      Microbiology: Recent Results (from the past 240 hour(s))  Resp panel by RT-PCR (RSV, Flu A&B, Covid)     Status: None   Collection Time: 07/04/22  1:23 AM  Result Value Ref Range Status   SARS Coronavirus 2 by RT PCR NEGATIVE NEGATIVE Final    Comment: (NOTE) SARS-CoV-2 target nucleic acids are NOT DETECTED.  The SARS-CoV-2 RNA is generally  detectable in upper respiratory specimens during the acute phase of infection. The lowest concentration of SARS-CoV-2 viral copies this assay can detect is 138 copies/mL. A negative result does not preclude SARS-Cov-2 infection and should not be used as the sole basis for treatment or other patient management decisions. A negative result may occur with  improper specimen collection/handling, submission of specimen other than nasopharyngeal swab, presence of viral mutation(s) within the areas targeted by this assay, and inadequate number of viral copies(<138 copies/mL). A negative result must be combined with  clinical observations, patient history, and epidemiological information. The expected result is Negative.  Fact Sheet for Patients:  EntrepreneurPulse.com.au  Fact Sheet for Healthcare Providers:  IncredibleEmployment.be  This test is no t yet approved or cleared by the [place] FDA and  has been authorized for detection and/or diagnosis of SARS-CoV-2 by FDA under an Emergency Use Authorization (EUA). This EUA will remain  in effect (meaning this test can be used) for the duration of the COVID-19 declaration under Section 564(b)(1) of the Act, 21 U.S.C.section 360bbb-3(b)(1), unless the authorization is terminated  or revoked sooner.       Influenza A by PCR NEGATIVE NEGATIVE Final   Influenza B by PCR NEGATIVE NEGATIVE Final    Comment: (NOTE) The Xpert Xpress SARS-CoV-2/FLU/RSV plus assay is intended as an aid in the diagnosis of influenza from Nasopharyngeal swab specimens and should not be used as a sole basis for treatment. Nasal washings and aspirates are unacceptable for Xpert Xpress SARS-CoV-2/FLU/RSV testing.  Fact Sheet for Patients: EntrepreneurPulse.com.au  Fact Sheet for Healthcare Providers: IncredibleEmployment.be  This test is not yet approved or cleared by the [place] FDA  and has been authorized for detection and/or diagnosis of SARS-CoV-2 by FDA under an Emergency Use Authorization (EUA). This EUA will remain in effect (meaning this test can be used) for the duration of the COVID-19 declaration under Section 564(b)(1) of the Act, 21 U.S.C. section 360bbb-3(b)(1), unless the authorization is terminated or revoked.     Resp Syncytial Virus by PCR NEGATIVE NEGATIVE Final    Comment: (NOTE) Fact Sheet for Patients: EntrepreneurPulse.com.au  Fact Sheet for Healthcare Providers: IncredibleEmployment.be  This test is not yet approved or cleared by the [place] FDA and has been authorized for detection and/or diagnosis of SARS-CoV-2 by FDA under an Emergency Use Authorization (EUA). This EUA will remain in effect (meaning this test can be used) for the duration of the COVID-19 declaration under Section 564(b)(1) of the Act, 21 U.S.C. section 360bbb-3(b)(1), unless the authorization is terminated or revoked.  Performed at [facility], [place] [address] [place], [place] [address]   Blood culture (routine x 2)     Status: Abnormal   Collection Time: 07/04/22  5:22 AM   Specimen: BLOOD  Result Value Ref Range Status   Specimen Description   Final    BLOOD LEFT ANTECUBITAL Performed at [facility] [address] [place], [place] [address]    Special Requests   Final    BOTTLES DRAWN AEROBIC AND ANAEROBIC Blood Culture results may not be optimal due to an excessive volume of blood received in culture bottles Performed at [facility] [address], [place], [place] [address]    Culture  Setup Time   Final    GRAM POSITIVE COCCI IN CLUSTERS ANAEROBIC BOTTLE ONLY CRITICAL VALUE NOTED.  VALUE IS CONSISTENT WITH PREVIOUSLY REPORTED AND CALLED VALUE.    Culture (A)  Final    STAPHYLOCOCCUS AUREUS SUSCEPTIBILITIES PERFORMED ON PREVIOUS CULTURE WITHIN THE  LAST 5 DAYS. Performed at [facility], [place] [address] [place], [place] [address]    Report Status 07/09/2022 FINAL  Final  Blood culture (routine x 2)     Status: Abnormal (Preliminary result)   Collection Time: 07/04/22  6:56 AM   Specimen: BLOOD RIGHT HAND  Result Value Ref Range Status   Specimen Description   Final    BLOOD RIGHT HAND Performed at [facility], [place]  [address], [place], [place] [address]    Special Requests   Final    BOTTLES DRAWN AEROBIC AND ANAEROBIC Blood Culture results may not be optimal due to an excessive volume of blood received in culture bottles Performed at [facility] [address] [place], [place] [address]    Culture  Setup Time   Final    GRAM POSITIVE COCCI IN CLUSTERS IN BOTH AEROBIC AND ANAEROBIC BOTTLES CRITICAL RESULT CALLED TO, READ BACK BY AND VERIFIED WITH: PHARMD J. LEGGE 07/13/2022 @ 0728 BY AB    Culture (A)  Final    METHICILLIN RESISTANT STAPHYLOCOCCUS AUREUS Sent to [facility] for further susceptibility testing. Performed at [facility], [place] [address] [place], [place] [address]    Report Status PENDING  Incomplete   Organism ID, Bacteria METHICILLIN RESISTANT STAPHYLOCOCCUS AUREUS  Final      Susceptibility   Methicillin resistant staphylococcus aureus - MIC*    CIPROFLOXACIN >=8 RESISTANT Resistant     ERYTHROMYCIN >=8 RESISTANT Resistant     GENTAMICIN <=0.5 SENSITIVE Sensitive     OXACILLIN >=4 RESISTANT Resistant     TETRACYCLINE <=1 SENSITIVE Sensitive     VANCOMYCIN 1 SENSITIVE Sensitive     TRIMETH/SULFA <=10 SENSITIVE Sensitive     CLINDAMYCIN >=8 RESISTANT Resistant     RIFAMPIN <=0.5 SENSITIVE Sensitive     Inducible Clindamycin NEGATIVE Sensitive     * METHICILLIN RESISTANT STAPHYLOCOCCUS AUREUS  Blood Culture ID Panel (Reflexed)     Status: Abnormal   Collection Time: 07/04/22  6:56 AM  Result Value Ref Range Status   Enterococcus faecalis NOT DETECTED NOT DETECTED Final    Enterococcus Faecium NOT DETECTED NOT DETECTED Final   Listeria monocytogenes NOT DETECTED NOT DETECTED Final   Staphylococcus species DETECTED (A) NOT DETECTED Final    Comment: CRITICAL RESULT CALLED TO, READ BACK BY AND VERIFIED WITH: PHARMD J. LEGGE 07/18/2022 @ 0728 BY AB    Staphylococcus aureus (BCID) DETECTED (A) NOT DETECTED Final    Comment: Methicillin (oxacillin)-resistant Staphylococcus aureus (MRSA). MRSA is predictably resistant to beta-lactam antibiotics (except ceftaroline). Preferred therapy is vancomycin unless clinically contraindicated. Patient requires contact precautions if  hospitalized. CRITICAL RESULT CALLED TO, READ BACK BY AND VERIFIED WITH: PHARMD J. LEGGE 07/08/2022 @ 0728 BY AB    Staphylococcus epidermidis NOT DETECTED NOT DETECTED Final   Staphylococcus lugdunensis NOT DETECTED NOT DETECTED Final   Streptococcus species NOT DETECTED NOT DETECTED Final   Streptococcus agalactiae NOT DETECTED NOT DETECTED Final   Streptococcus pneumoniae NOT DETECTED NOT DETECTED Final   Streptococcus pyogenes NOT DETECTED NOT DETECTED Final   A.calcoaceticus-baumannii NOT DETECTED NOT DETECTED Final   Bacteroides fragilis NOT DETECTED NOT DETECTED Final   Enterobacterales NOT DETECTED NOT DETECTED Final   Enterobacter cloacae complex NOT DETECTED NOT DETECTED Final   Escherichia coli NOT DETECTED NOT DETECTED Final   Klebsiella aerogenes NOT DETECTED NOT DETECTED Final   Klebsiella oxytoca NOT DETECTED NOT DETECTED Final   Klebsiella pneumoniae NOT DETECTED NOT DETECTED Final   Proteus species NOT DETECTED NOT DETECTED Final   Salmonella species NOT DETECTED NOT DETECTED Final   Serratia marcescens NOT DETECTED NOT DETECTED Final   Haemophilus influenzae NOT DETECTED NOT DETECTED Final   Neisseria meningitidis NOT DETECTED NOT DETECTED Final   Pseudomonas aeruginosa NOT DETECTED NOT DETECTED Final   Stenotrophomonas maltophilia NOT DETECTED NOT DETECTED Final   Candida  albicans NOT DETECTED NOT DETECTED Final   Candida auris NOT DETECTED NOT  DETECTED Final   Candida glabrata NOT DETECTED NOT DETECTED Final   Candida krusei NOT DETECTED NOT DETECTED Final   Candida parapsilosis NOT DETECTED NOT DETECTED Final   Candida tropicalis NOT DETECTED NOT DETECTED Final   Cryptococcus neoformans/gattii NOT DETECTED NOT DETECTED Final   Meth resistant mecA/C and MREJ DETECTED (A) NOT DETECTED Final    Comment: CRITICAL RESULT CALLED TO, READ BACK BY AND VERIFIED WITH: PHARMD J. LEGGE 07/25/2022 @ 0728 BY AB Performed at [facility], [place] [address] [place], [place] [address]   Surgical pcr screen     Status: Abnormal   Collection Time: 06/27/2022  2:15 PM   Specimen: Nasal Mucosa; Nasal Swab  Result Value Ref Range Status   MRSA, PCR POSITIVE (A) NEGATIVE Final    Comment: RESULT CALLED TO, READ BACK BY AND VERIFIED WITH:  Toney Sang, RN 07/04/2022 1803 A. LAFRANCE    Staphylococcus aureus POSITIVE (A) NEGATIVE Final    Comment: (NOTE) The Xpert SA Assay (FDA approved for NASAL specimens in patients 24 years of age and older), is one component of a comprehensive surveillance program. It is not intended to diagnose infection nor to guide or monitor treatment. Performed at [facility], [place] [address] [place], [place] [address]   Aerobic Culture w Gram Stain (superficial specimen)     Status: None   Collection Time: 06/27/2022  5:12 PM   Specimen: Abscess  Result Value Ref Range Status   Specimen Description WOUND  Final   Special Requests  THORACIC EPIDURAL ABCESS  Final   Gram Stain   Final    RARE WBC PRESENT, PREDOMINANTLY PMN FEW GRAM POSITIVE COCCI Performed at [facility], [place] [address], [place], [place] [address]    Culture   Final    ABUNDANT METHICILLIN RESISTANT STAPHYLOCOCCUS AUREUS   Report Status 07/08/2022 FINAL  Final   Organism ID, Bacteria METHICILLIN RESISTANT STAPHYLOCOCCUS AUREUS  Final      Susceptibility    Methicillin resistant staphylococcus aureus - MIC*    CIPROFLOXACIN >=8 RESISTANT Resistant     ERYTHROMYCIN >=8 RESISTANT Resistant     GENTAMICIN <=0.5 SENSITIVE Sensitive     OXACILLIN >=4 RESISTANT Resistant     TETRACYCLINE 2 SENSITIVE Sensitive     VANCOMYCIN 1 SENSITIVE Sensitive     TRIMETH/SULFA <=10 SENSITIVE Sensitive     CLINDAMYCIN >=8 RESISTANT Resistant     RIFAMPIN <=0.5 SENSITIVE Sensitive     Inducible Clindamycin NEGATIVE Sensitive     * ABUNDANT METHICILLIN RESISTANT STAPHYLOCOCCUS AUREUS  Culture, blood (Routine X 2) w Reflex to ID Panel     Status: None (Preliminary result)   Collection Time: 07/06/22  4:38 AM   Specimen: BLOOD  Result Value Ref Range Status   Specimen Description BLOOD BLOOD RIGHT HAND  Final   Special Requests   Final    BOTTLES DRAWN AEROBIC AND ANAEROBIC Blood Culture adequate volume   Culture   Final    NO GROWTH 4 DAYS Performed at [facility], [address] [place],  [address]    Report Status PENDING  Incomplete  Culture, blood (Routine X 2) w Reflex to ID Panel     Status: None (Preliminary result)   Collection Time: 07/06/22  4:38 AM   Specimen: BLOOD  Result Value Ref Range Status   Specimen Description BLOOD BLOOD RIGHT HAND  Final   Special Requests   Final    BOTTLES DRAWN  AEROBIC AND ANAEROBIC Blood Culture adequate volume   Culture   Final    NO GROWTH 4 DAYS Performed at [facility] [address], [place], [place] [address]    Report Status PENDING  Incomplete     Serology:   Imaging: If present, new imagings (plain films, ct scans, and mri) have been personally visualized and interpreted; radiology reports have been reviewed. Decision making incorporated into the Impression / Recommendations.  3/9 ct abd pelv with contrast 1. No acute intra-abdominal or pelvic pathology. 2. Sigmoid diverticulosis. No bowel obstruction. Normal appendix. 3. Bibasilar subpleural streaky and reticular  atelectasis or infiltrate. Aspiration is not excluded clinical correlation is recommended. 4.  Aortic Atherosclerosis     3/9 cxr FINDINGS: Stable cardiomegaly. Aortic atherosclerotic calcification. Pulmonary vascular congestion. Unchanged mild left basilar airspace opacities. No definite pleural effusion. No pneumothorax. No acute osseous abnormality. Postoperative changes right humeral head.   IMPRESSION: 1. Cardiomegaly with pulmonary vascular congestion. 2. Unchanged mild left basilar atelectasis/scarring.     3/10 mri lumbar thoracic spine wwo contrast 1. Longitudinally extensive epidural abscess extending from at least C7-T1 caudally through the L1-2. Based on the localizer images, suspect involvement of the cervical spinal canal as well. 2. Multifocal moderate to severe compression of the spinal cord at T8-9, T10-11, T11-12. 3. Disc edema with adjacent endplate marrow edema and enhancement at T6-7, T8-9, T10-11 and T11-12. Findings are suspicious for multifocal discitis-osteomyelitis, though there is little correlate on the recent CTA chest. 4. Clumping of the cauda equina nerve roots and conus with associated leptomeningeal enhancement, consistent with superimposed arachnoiditis.   3/11 tte  1. Left ventricular ejection fraction, by estimation, is 70 to 75%. The left ventricle has hyperdynamic function. The left ventricle has no regional wall motion abnormalities. There is mild asymmetric left ventricular hypertrophy of the basal-septal  segment. Left ventricular diastolic parameters are indeterminate.   2. Right ventricular systolic function is normal. The right ventricular size is normal.   3. Left atrial size was mild to moderately dilated.   4. Right atrial size was mild to moderately dilated.   5. The mitral valve is normal in structure. No evidence of mitral valve regurgitation. No evidence of mitral stenosis.   6. The aortic valve is tricuspid. There is moderate  calcification of the aortic valve. Aortic valve regurgitation is trivial. Mild aortic valve stenosis. Aortic valve area, by VTI measures 1.43 cm. Aortic valve mean gradient measures 17.0 mmHg. Aortic  valve Vmax measures 2.73 m/s.   7. The inferior vena cava is normal in size with greater than 50% respiratory variability, suggesting right atrial pressure of 3 mmHg.   8. No TTE evidence of endocarditis. If clinical suspicion is high would recommend TEE.       Jabier Mutton, [facility] [place] [phone] pager    07/10/2022, 11:35 AM

## 2022-07-10 NOTE — Progress Notes (Signed)
PHARMACY CONSULT NOTE FOR:  OUTPATIENT  PARENTERAL ANTIBIOTIC THERAPY (OPAT)  Indication: MRSA bacteremia/epidural abscess Regimen: Daptomycin 600 mg every 24 hours  End date: 08/07/22  IV antibiotic discharge orders are pended. To discharging provider:  please sign these orders via discharge navigator,  Select New Orders & click on the button choice - Manage This Unsigned Work.     Thank you for allowing pharmacy to be a part of this patient's care.  Jimmy Footman, PharmD, BCPS, BCIDP Infectious Diseases Clinical Pharmacist Phone: [phone] 07/10/2022, 1:14 PM

## 2022-07-10 NOTE — Progress Notes (Signed)
Physical Therapy Treatment Patient Details Name: [name] MRN: [id number] DOB: 06/18/1930 Today's Date: 07/10/2022   History of Present Illness 89 yo female presenting 3/8 with abdominal pain. Work up revealed CAP. Pt transferred [facility] on [facility]/10 after MRI showed "extensive epidural abscess extending from at least C7 / T1 caudally through the L1-2 with multifocal mod / severe compression of the spinal cord at T8-9, T10-11, T11-12" And is now s/p T9-11 laminectomy with evacuation of epidural abscess on 3/10. PMH includes: cellulitis of Left leg, CHF, COPD, DM II, glaucoma, HLD, and HTN.    PT Comments    Focused session on performing transfers and lower extremity exercises to promote muscular strength and functional mobility independence and safety. Pt demonstrating delayed/poor initiation of lower extremity muscles with transfers, eventually engaging once >50% to stand and only needing modA to complete transfer to stand but needing maxA to initiate transfer. Performed x8 reps sit <> stand from EOB and seated marching and LAQs. Pt reporting fatigue and requesting to return to bed after these interventions. Will continue to follow acutely. Current recommendations remain appropriate.    Recommendations for follow up therapy are one component of a multi-disciplinary discharge planning process, led by the attending physician.  Recommendations may be updated based on patient status, additional functional criteria and insurance authorization.  Follow Up Recommendations  Skilled nursing-short term rehab (<3 hours/day) Can patient physically be transported by private vehicle: No   Assistance Recommended at Discharge Frequent or constant Supervision/Assistance  Patient can return home with the following Two people to help with walking and/or transfers;Two people to help with bathing/dressing/bathroom;Direct supervision/assist for medications management;Direct supervision/assist for financial  management;Assist for transportation;Help with stairs or ramp for entrance;Assistance with feeding;Assistance with cooking/housework   Equipment Recommendations  Other (comment) (defer to post acute)    Recommendations for Other Services       Precautions / Restrictions Precautions Precautions: Fall;Back Precaution Booklet Issued: No Precaution Comments: needs cues to maintain precautions Required Braces or Orthoses:  (no brace needed orders) Restrictions Weight Bearing Restrictions: No     Mobility  Bed Mobility Overal bed mobility: Needs Assistance Bed Mobility: Rolling, Sidelying to Sit, Sit to Sidelying Rolling: Min assist Sidelying to sit: Mod assist, HOB elevated     Sit to sidelying: HOB elevated, Max assist General bed mobility comments: Verbal cues provided to flex R leg and reach R UE to L bed rail to log roll, minA to complete rotation of trunk. ModA to ascend trunk with pt bringing legs off EOB as cued. MaxA to lift legs and direct trunk safely with return to sidelying>supine    Transfers Overall transfer level: Needs assistance Equipment used: 1 person hand held assist (face to face transfer) Transfers: Sit to/from Stand Sit to Stand: Max assist           General transfer comment: Face-to-face approach provided with pt holding onto therapist for support with knees blocked as needed. x8 reps from EOB with maxA to initiate due to pt demonstrating delayed/poor initiation of lower extremity muscles with transfers, eventually engaging once >50% to stand and only needing modA to complete transfer to stand    Ambulation/Gait               General Gait Details: deferred for safety and to focus on transfers/exercises   Stairs             Wheelchair Mobility    Modified Rankin (Stroke Patients Only)  Balance Overall balance assessment: Needs assistance Sitting-balance support: Feet supported, Bilateral upper extremity supported Sitting  balance-Leahy Scale: Poor   Postural control: Posterior lean Standing balance support: Bilateral upper extremity supported, During functional activity Standing balance-Leahy Scale: Poor Standing balance comment: ModA with bil UE support and intermittent bil knee block to stand statically up to ~10 sec at a time                            Cognition Arousal/Alertness: Awake/alert Behavior During Therapy: Flat affect Overall Cognitive Status: Impaired/Different from baseline Area of Impairment: Attention, Memory, Following commands, Safety/judgement, Awareness, Problem solving                   Current Attention Level: Sustained Memory: Decreased short-term memory, Decreased recall of precautions Following Commands: Follows one step commands consistently, Follows one step commands with increased time Safety/Judgement: Decreased awareness of safety, Decreased awareness of deficits Awareness: Intellectual Problem Solving: Slow processing, Decreased initiation, Difficulty sequencing, Requires verbal cues General Comments: HOH, needing extra time to process and repetition of cues. Needs cues to maintain back precautions. Poor awareness of deficits. Reports fear of falling, demonstrating delayed/poor initiation of lower extremity muscles with transfers, eventually engaging once >50% to stand        Exercises General Exercises - Lower Extremity Long Arc Quad: AROM, Both, Seated, Strengthening, 10 reps Hip Flexion/Marching: AROM, Strengthening, Both, 10 reps, Seated Other Exercises Other Exercises: sit <> stand 8x    General Comments General comments (skin integrity, edema, etc.): VSS on 2L      Pertinent Vitals/Pain Pain Assessment Pain Assessment: Faces Faces Pain Scale: Hurts little more Pain Location: generalized with movement Pain Descriptors / Indicators: Discomfort, Grimacing, Moaning Pain Intervention(s): Monitored during session, Limited activity within  patient's tolerance, Repositioned    Home Living                          Prior Function            PT Goals (current goals can now be found in the care plan section) Acute Rehab PT Goals Patient Stated Goal: to not fall PT Goal Formulation: With patient Time For Goal Achievement: 07/20/22 Potential to Achieve Goals: Fair Progress towards PT goals: Progressing toward goals    Frequency    Min 3X/week      PT Plan Current plan remains appropriate    Co-evaluation              AM-PAC PT "6 Clicks" Mobility   Outcome Measure  Help needed turning from your back to your side while in a flat bed without using bedrails?: A Little Help needed moving from lying on your back to sitting on the side of a flat bed without using bedrails?: A Lot Help needed moving to and from a bed to a chair (including a wheelchair)?: Total Help needed standing up from a chair using your arms (e.g., wheelchair or bedside chair)?: A Lot Help needed to walk in hospital room?: Total Help needed climbing 3-5 steps with a railing? : Total 6 Click Score: 10    End of Session Equipment Utilized During Treatment: Oxygen Activity Tolerance: Patient tolerated treatment well Patient left: with call bell/phone within reach;in bed;with bed alarm set   PT Visit Diagnosis: Other abnormalities of gait and mobility (R26.89);Muscle weakness (generalized) (M62.81);Unsteadiness on feet (R26.81);Difficulty in walking, not elsewhere classified (R26.2)  Time: [id number] PT Time Calculation (min) (ACUTE ONLY): 13 min  Charges:  $Therapeutic Exercise: 8-22 mins                     Moishe Spice, PT, DPT Acute Rehabilitation Services  Office: [phone]    Orvan Falconer 07/10/2022, 5:34 PM

## 2022-07-10 NOTE — Progress Notes (Signed)
07/10/22 1208  What Happened  Was fall witnessed? Yes  Who witnessed fall? RN and Mobility Tech  Patients activity before fall other (comment) (BSC and then to chair)  Point of contact other (comment) (Knees)  Was patient injured? Yes  Provider Notification  Provider Name/Title [place]  Date Provider Notified 07/10/22  Time Provider Notified 1200  Method of Notification Page (secure chat)  Notification Reason Fall  Date of Provider Response 07/10/22  Follow Up  Family notified Yes - comment  Time family notified 1230  Additional tests No  Simple treatment Dressing  Progress note created (see row info) Yes  Adult Fall Risk Assessment  Risk Factor Category (scoring not indicated) Not Applicable  Age 89  Fall History: Fall within 6 months prior to admission 0  Elimination; Bowel and/or Urine Incontinence 2  Elimination; Bowel and/or Urine Urgency/Frequency 0  Medications: includes PCA/Opiates, Anti-convulsants, Anti-hypertensives, Diuretics, Hypnotics, Laxatives, Sedatives, and Psychotropics 5  Patient Care Equipment 2  Mobility-Assistance 2  Mobility-Gait 2  Mobility-Sensory Deficit 2  Altered awareness of immediate physical environment 0  Impulsiveness 0  Lack of understanding of one's physical/cognitive limitations 0  Total Score 18  Patient Fall Risk Level High fall risk  Adult Fall Risk Interventions  Required Bundle Interventions *See Row Information* High fall risk - low, moderate, and high requirements implemented  Additional Interventions Reorient/diversional activities with confused patients;Room near nurses station;Use of appropriate toileting equipment (bedpan, BSC, etc.)  Fall intervention(s) refused/Patient educated regarding refusal Bed alarm;Nonskid socks;Supervision while toileting/edge of bed sitting  Screening for Fall Injury Risk (To be completed on HIGH fall risk patients) - Assessing Need for Floor Mats  Risk For Fall Injury- Criteria for Floor Mats 85  years or older;Previous fall this admission;Confusion/dementia (+NuDESC, CIWA, TBI, etc.)  Will Implement Floor Mats Yes  Vitals  Temp 97.7 F (36.5 C)  Temp Source Oral  BP 121/82  MAP (mmHg) 94  BP Location Left Arm  BP Method Automatic  Patient Position (if appropriate) Lying  Pulse Rate 97  Pulse Rate Source Monitor  ECG Heart Rate 84  Cardiac Rhythm NSR  Resp 20  Oxygen Therapy  SpO2 94 %  O2 Device Nasal Cannula  O2 Flow Rate (L/min) 2 L/min  Pain Assessment  Pain Scale 0-10  Pain Score 0  PAINAD (Pain Assessment in Advanced Dementia)  Breathing 0  Negative Vocalization 0  Facial Expression 0  Body Language 0  Consolability 0  PAINAD Score 0  Neurological  Neuro (WDL) X  Level of Consciousness Alert  Orientation Level Oriented to person;Oriented to place;Disoriented to situation;Disoriented to time  Cognition Follows commands  Speech Clear  R Pupil Size (mm) 2  R Pupil Shape Round  R Pupil Reaction Brisk  L Pupil Size (mm) 2  L Pupil Shape Round  L Pupil Reaction Brisk  Motor Function/Sensation Assessment Motor response  R Foot Dorsiflexion Weak  L Foot Dorsiflexion Weak  R Foot Plantar Flexion Weak  L Foot Plantar Flexion Weak  RUE Motor Response Purposeful movement  RUE Motor Strength 4  LUE Motor Response Purposeful movement  LUE Motor Strength 4  RLE Motor Response Purposeful movement  RLE Motor Strength 4  LLE Motor Response Purposeful movement  LLE Motor Strength 4  Neuro Symptoms Forgetful  Neuro symptoms relieved by Rest  Glasgow Coma Scale  Eye Opening 4  Best Verbal Response (NON-intubated) 4  Best Motor Response 6  Glasgow Coma Scale Score 14  Musculoskeletal  Musculoskeletal (WDL) X  Assistive Device Stedy  Generalized Weakness Yes  Weight Bearing Restrictions No  Integumentary  Integumentary (WDL) X  Skin Color Appropriate for ethnicity  Skin Integrity Abrasion  Abrasion Location Knee  Abrasion Location Orientation Right   Abrasion Intervention Foam

## 2022-07-11 DIAGNOSIS — J189 Pneumonia, unspecified organism: Secondary | ICD-10-CM | POA: Diagnosis not present

## 2022-07-11 DIAGNOSIS — E871 Hypo-osmolality and hyponatremia: Secondary | ICD-10-CM | POA: Diagnosis not present

## 2022-07-11 DIAGNOSIS — G061 Intraspinal abscess and granuloma: Secondary | ICD-10-CM | POA: Diagnosis not present

## 2022-07-11 DIAGNOSIS — R7881 Bacteremia: Secondary | ICD-10-CM | POA: Diagnosis not present

## 2022-07-11 LAB — CULTURE, BLOOD (ROUTINE X 2)
Culture: NO GROWTH
Culture: NO GROWTH
Special Requests: ADEQUATE
Special Requests: ADEQUATE

## 2022-07-11 LAB — GLUCOSE, CAPILLARY
Glucose-Capillary: 190 mg/dL — ABNORMAL HIGH (ref 70–99)
Glucose-Capillary: 237 mg/dL — ABNORMAL HIGH (ref 70–99)
Glucose-Capillary: 230 mg/dL — ABNORMAL HIGH (ref 70–99)
Glucose-Capillary: 201 mg/dL — ABNORMAL HIGH (ref 70–99)

## 2022-07-11 LAB — BASIC METABOLIC PANEL
Anion gap: 7 (ref 5–15)
BUN: 11 mg/dL (ref 8–23)
CO2: 26 mmol/L (ref 22–32)
Calcium: 8.1 mg/dL — ABNORMAL LOW (ref 8.9–10.3)
Chloride: 91 mmol/L — ABNORMAL LOW (ref 98–111)
Creatinine, Ser: 0.84 mg/dL (ref 0.44–1.00)
GFR, Estimated: >60 mL/min (ref >60)
Glucose, Bld: 198 mg/dL — ABNORMAL HIGH (ref 70–99)
Potassium: 4.8 mmol/L (ref 3.5–5.1)
Sodium: 124 mmol/L — ABNORMAL LOW (ref 135–145)

## 2022-07-11 NOTE — Progress Notes (Signed)
[facility]  PROGRESS NOTE  [name] [id number] DOB: 1930/10/21 DOA: 07/23/2022 PCP: [name], DO   Brief HPI:   89 year old female with past medical history of chronic hypoxemic respiratory failure on 4 L of oxygen, chronic diastolic heart failure, diabetes mellitus type 2, DVT on Xarelto admitted to [facility] long hospital on 07/04/2022 for right-sided chest discomfort, found to be septic due to pneumonia, started on Rocephin and Zithromax.  On 07/15/2022 blood culture positive for MRSA, imaging showed epidural abscess with cord compression, disc edema.  Neurosurgery was consulted, patient was transferred to [facility] for incision and drainage of T9, T10 and T11, surgical intervention with laminectomy.    Subjective   Patient seen and examined, no new complaints.  Less confused this morning.   Assessment/Plan:    Sepsis due to MRSA bacteremia -Extensive abscess of thoracic lumbar spine, T9, T10, T11 -S/p laminectomy and I&D / bilateral lower lobe pneumonia -Blood cultures grew MRSA on 07/06/2022 -Repeat blood cultures on 07/06/2022 remain negative to date -MRI of the spine was concerning for abscess, underwent I&D and laminectomy per neurosurgery on 07/25/2022 -ID consulted, TTE showed no wall motion normality, mild aortic stenosis. -ID recommends to hold off on TEE for now as repeat blood cultures are negative -Central line right IJ removed on 07/07/2022 -Vancomycin switched to daptomycin per ID  Delirium -Likely medication induced,  -she was started on Haldol and melatonin -Continue delirium precautions  Chronic respiratory failure with hypoxemia and hypercarbia -COPD, not in exacerbation -Patient is currently on 2 L of oxygen by nasal cannula -Wean off oxygen as tolerated  Chronic diastolic heart failure -Stable  Hypertension -Hold HCTZ, continue amlodipine, Imdur  Hypovolemic hyponatremia -Sodium is again down to 124 -serum osmolality: 271 -Will change fluid  restriction to 1.2 L/day  -Follow urine osmolality  History of DVT -Patient is on Xarelto at home which is currently on hold due to recent surgical intervention -Will start anticoagulation when okay with neurosurgery   Diabetes mellitus type 2 -Hemoglobin A1c greater than 10.0 in January of this year -CBG well-controlled, has been on lower side -Continue very sensitive sliding scale with NovoLog      Medications     amLODipine  10 mg Oral Daily   brimonidine  1 drop Both Eyes Daily   brinzolamide  1 drop Both Eyes Daily   Chlorhexidine Gluconate Cloth  6 each Topical Daily   Gerhardt's butt cream   Topical Daily   insulin aspart  0-6 Units Subcutaneous TID WC   isosorbide mononitrate  60 mg Oral Daily   latanoprost  1 drop Both Eyes QHS   metoprolol tartrate  12.5 mg Oral BID   pantoprazole  40 mg Oral Daily   polyethylene glycol  17 g Oral Daily   senna-docusate  1 tablet Oral BID   sodium chloride flush  3 mL Intravenous Q12H   sodium chloride  2 g Oral BID WC   umeclidinium-vilanterol  1 puff Inhalation Daily     Data Reviewed:   CBG:  Recent Labs  Lab 07/10/22 0612 07/10/22 1120 07/10/22 1659 07/10/22 2158 07/11/22 0643  GLUCAP 183* 200* 267* 186* 190*    SpO2: 99 % O2 Flow Rate (L/min): 2 L/min FiO2 (%): 36 %    Vitals:   07/10/22 1702 07/10/22 1955 07/11/22 0420 07/11/22 0740  BP: 121/62 91/65 (!) 109/51 (!) 116/55  Pulse: (!) 103 86 75 71  Resp: 19 18 18 14   Temp: 98.2 F (36.8 C) 98.6  F (37 C) 97.9 F (36.6 C) 97.6 F (36.4 C)  TempSrc: Axillary Oral Oral Oral  SpO2: 96% 97% 98% 99%  Weight:      Height:          Data Reviewed:  Basic Metabolic Panel: Recent Labs  Lab 07/09/2022 0525 07/21/2022 1947 07/06/22 0438 07/07/22 0358 07/08/22 0529 07/09/22 0625 07/10/22 0428 07/11/22 0501  NA 124*   < > 130* 129* 128* 126* 126* 124*  K 3.8   < > 3.8 4.7 4.9 4.1 4.6 4.8  CL 87*   < > 90* 96* 95* 94* 93* 91*  CO2 25   < > 23 25 24  25 26 26   GLUCOSE 206*   < > 91 58* 196* 146* 188* 198*  BUN 22   < > 19 19 18 15 12 11   CREATININE 0.86   < > 0.94 0.94 0.86 0.69 0.89 0.84  CALCIUM 8.3*   < > 8.4* 8.1* 8.7* 8.6* 8.3* 8.1*  MG 1.8  --  1.7 2.0  --   --   --   --   PHOS  --   --  3.5  --   --   --   --   --    < > = values in this interval not displayed.    CBC: Recent Labs  Lab 07/09/2022 0525 07/11/2022 1947 07/06/22 0438 07/10/22 0428  WBC 23.6* 26.3* 21.6* 13.6*  NEUTROABS  --   --  18.9*  --   HGB 10.7* 9.5* 9.7* 8.3*  HCT 34.0* 30.0* 29.9* 25.8*  MCV 90.4 89.3 90.3 87.8  PLT PLATELET CLUMPS NOTED ON SMEAR, UNABLE TO ESTIMATE PLATELET CLUMPS NOTED ON SMEAR, UNABLE TO ESTIMATE PLATELET CLUMPS NOTED ON SMEAR, UNABLE TO ESTIMATE 154    LFT Recent Labs  Lab 07/06/22 0438  AST 24  ALT 24  ALKPHOS 144*  BILITOT 0.6  PROT 5.6*  ALBUMIN 2.0*     Antibiotics: Anti-infectives (From admission, onward)    Start     Dose/Rate Route Frequency Ordered Stop   07/08/22 2000  DAPTOmycin (CUBICIN) 600 mg in sodium chloride 0.9 % IVPB        600 mg 124 mL/hr over 30 Minutes Intravenous Daily 07/08/22 1612     07/06/22 2200  vancomycin (VANCOCIN) IVPB 1000 mg/200 mL premix  Status:  Discontinued        1,000 mg 200 mL/hr over 60 Minutes Intravenous Every 36 hours 07/09/2022 0738 07/12/2022 1221   07/06/22 1000  vancomycin (VANCOREADY) IVPB 750 mg/150 mL  Status:  Discontinued        750 mg 150 mL/hr over 60 Minutes Intravenous Daily 06/27/2022 1221 07/24/2022 2031   07/06/22 0700  vancomycin (VANCOCIN) IVPB 1000 mg/200 mL premix  Status:  Discontinued        1,000 mg 200 mL/hr over 60 Minutes Intravenous Daily 07/18/2022 2031 07/08/22 1612   07/14/2022 2200  cefTRIAXone (ROCEPHIN) 2 g in sodium chloride 0.9 % 100 mL IVPB  Status:  Discontinued        2 g 200 mL/hr over 30 Minutes Intravenous Every 12 hours 07/20/2022 1932 07/06/22 1128   06/29/2022 1746  vancomycin (VANCOCIN) powder  Status:  Discontinued          As needed  06/26/2022 1747 07/01/2022 1812   07/08/2022 0900  vancomycin (VANCOREADY) IVPB 1500 mg/300 mL        1,500 mg 150 mL/hr over 120 Minutes Intravenous  Once 07/21/2022 0738  07/14/2022 1102   07/15/2022 0300  cefTRIAXone (ROCEPHIN) 2 g in sodium chloride 0.9 % 100 mL IVPB  Status:  Discontinued        2 g 200 mL/hr over 30 Minutes Intravenous Every 24 hours 07/04/22 0821 07/20/2022 1607   07/04/22 1800  cefTRIAXone (ROCEPHIN) 1 g in sodium chloride 0.9 % 100 mL IVPB  Status:  Discontinued        1 g 200 mL/hr over 30 Minutes Intravenous Every 24 hours 07/04/22 0216 07/04/22 0821   07/04/22 1800  azithromycin (ZITHROMAX) 500 mg in sodium chloride 0.9 % 250 mL IVPB  Status:  Discontinued        500 mg 250 mL/hr over 60 Minutes Intravenous Every 24 hours 07/04/22 0216 07/07/2022 1607   07/04/22 0145  cefTRIAXone (ROCEPHIN) 2 g in sodium chloride 0.9 % 100 mL IVPB        2 g 200 mL/hr over 30 Minutes Intravenous  Once 07/04/22 0136 07/04/22 0247   07/04/22 0145  azithromycin (ZITHROMAX) 500 mg in sodium chloride 0.9 % 250 mL IVPB        500 mg 250 mL/hr over 60 Minutes Intravenous  Once 07/04/22 0136 07/04/22 0544        DVT prophylaxis: SCDs  Code Status: Full code  Family Communication: Discussed with patient's son at bedside   CONSULTS    Objective    Physical Examination:  Appears in no acute distress S1-S2, regular, no murmur auscultated Lungs clear to auscultation bilaterally Abdomen is soft, nontender, no organomegaly   Status is: Inpatient:             Oswald Hillock   [facility] If 7PM-7AM, please contact night-coverage at [email or web address], Office  [phone]   07/11/2022, 9:21 AM  LOS: 7 days

## 2022-07-11 NOTE — Progress Notes (Signed)
Mobility Specialist: Progress Note   07/11/22 1557  Mobility  Activity Transferred from bed to chair  Level of Assistance Moderate assist, patient does 50-74%  Assistive Device Stedy  Activity Response Tolerated well  Mobility Referral Yes  $Mobility charge 1 Mobility   Pre-Mobility: 92 HR, 98% SpO2 Post-Mobility: 81 HR, 100% SpO2  Pt received in the bed and agreeable to mobility. Session completed on 3 L/min [place]. ModA with bed mobility and minA to stand. No c/o throughout. Pt transferred to the chair with call bell in her lap. Chair alarm is on.   [place] Rondarius Kadrmas Mobility Specialist Please contact via SecureChat or Rehab office at [phone]

## 2022-07-12 DIAGNOSIS — J189 Pneumonia, unspecified organism: Secondary | ICD-10-CM | POA: Diagnosis not present

## 2022-07-12 DIAGNOSIS — G061 Intraspinal abscess and granuloma: Secondary | ICD-10-CM | POA: Diagnosis not present

## 2022-07-12 DIAGNOSIS — E871 Hypo-osmolality and hyponatremia: Secondary | ICD-10-CM | POA: Diagnosis not present

## 2022-07-12 DIAGNOSIS — R7881 Bacteremia: Secondary | ICD-10-CM | POA: Diagnosis not present

## 2022-07-12 LAB — GLUCOSE, CAPILLARY
Glucose-Capillary: 176 mg/dL — ABNORMAL HIGH (ref 70–99)
Glucose-Capillary: 184 mg/dL — ABNORMAL HIGH (ref 70–99)
Glucose-Capillary: 275 mg/dL — ABNORMAL HIGH (ref 70–99)
Glucose-Capillary: 356 mg/dL — ABNORMAL HIGH (ref 70–99)

## 2022-07-12 LAB — BASIC METABOLIC PANEL
Anion gap: 7 (ref 5–15)
BUN: 15 mg/dL (ref 8–23)
CO2: 27 mmol/L (ref 22–32)
Calcium: 8.2 mg/dL — ABNORMAL LOW (ref 8.9–10.3)
Chloride: 91 mmol/L — ABNORMAL LOW (ref 98–111)
Creatinine, Ser: 0.96 mg/dL (ref 0.44–1.00)
GFR, Estimated: 57 mL/min — ABNORMAL LOW (ref >60)
Glucose, Bld: 191 mg/dL — ABNORMAL HIGH (ref 70–99)
Potassium: 5.4 mmol/L — ABNORMAL HIGH (ref 3.5–5.1)
Sodium: 125 mmol/L — ABNORMAL LOW (ref 135–145)

## 2022-07-12 MED ORDER — SODIUM ZIRCONIUM CYCLOSILICATE 5 G PO PACK
5.0000 g | PACK | Freq: Once | ORAL | Status: AC
  Administered 2022-07-12: 5 g via ORAL
  Filled 2022-07-12: qty 1

## 2022-07-12 NOTE — Progress Notes (Signed)
[facility]  PROGRESS NOTE  [name] [id number] DOB: [birth year] DOA: 07/20/2022 PCP: [name], DO   Brief HPI:   89 year old female with past medical history of chronic hypoxemic respiratory failure on 4 L of oxygen, chronic diastolic heart failure, diabetes mellitus type 2, DVT on Xarelto admitted to [facility] hospital on 07/04/2022 for right-sided chest discomfort, found to be septic due to pneumonia, started on Rocephin and Zithromax.  On 07/15/2022 blood culture positive for MRSA, imaging showed epidural abscess with cord compression, disc edema.  Neurosurgery was consulted, patient was transferred to [facility] for incision and drainage of T9, T10 and T11, surgical intervention with laminectomy.    Subjective   No new complaints.   Assessment/Plan:    Sepsis due to MRSA bacteremia -Extensive abscess of thoracic lumbar spine, T9, T10, T11 -S/p laminectomy and I&D / bilateral lower lobe pneumonia -Blood cultures grew MRSA on 07/02/2022 -Repeat blood cultures on 07/06/2022 remain negative to date -MRI of the spine was concerning for abscess, underwent I&D and laminectomy per neurosurgery on 07/11/2022 -ID consulted, TTE showed no wall motion normality, mild aortic stenosis. -ID recommends to hold off on TEE for now as repeat blood cultures are negative -Central line right IJ removed on 07/07/2022 -Vancomycin switched to daptomycin per ID  Delirium -Likely medication induced,  -she was started on Haldol and melatonin -Continue delirium precautions  Chronic respiratory failure with hypoxemia and hypercarbia -COPD, not in exacerbation -Patient is currently on 2 L of oxygen by nasal cannula -Wean off oxygen as tolerated  Chronic diastolic heart failure -Stable  Hypertension -Hold HCTZ, continue amlodipine, Imdur  Hypovolemic hyponatremia -Sodium is 125 this morning -serum osmolality: 271 -Will change fluid restriction to 1.2 L/day  -Follow urine  osmolality  History of DVT -Patient is on Xarelto at home which is currently on hold due to recent surgical intervention -Will start anticoagulation when okay with neurosurgery   Diabetes mellitus type 2 -Hemoglobin A1c greater than 10.0 in January of this year -CBG well-controlled, has been on lower side -Continue very sensitive sliding scale with NovoLog  Hyperkalemia -Mild, potassium is 5.4 -Will give 1 dose of Lokelma 5 g p.o. -Follow BMP in am    Medications     amLODipine  10 mg Oral Daily   brimonidine  1 drop Both Eyes Daily   brinzolamide  1 drop Both Eyes Daily   Chlorhexidine Gluconate Cloth  6 each Topical Daily   Gerhardt's butt cream   Topical Daily   insulin aspart  0-6 Units Subcutaneous TID WC   isosorbide mononitrate  60 mg Oral Daily   latanoprost  1 drop Both Eyes QHS   metoprolol tartrate  12.5 mg Oral BID   pantoprazole  40 mg Oral Daily   polyethylene glycol  17 g Oral Daily   senna-docusate  1 tablet Oral BID   sodium chloride flush  3 mL Intravenous Q12H   sodium chloride  2 g Oral BID WC   sodium zirconium cyclosilicate  5 g Oral Once   umeclidinium-vilanterol  1 puff Inhalation Daily     Data Reviewed:   CBG:  Recent Labs  Lab 07/11/22 0643 07/11/22 1206 07/11/22 1640 07/11/22 2124 07/12/22 0628  GLUCAP 190* 237* 230* 201* 176*    SpO2: 95 % O2 Flow Rate (L/min): 2 L/min FiO2 (%): 36 %    Vitals:   07/11/22 1559 07/11/22 1951 07/11/22 2307 07/12/22 0351  BP: (!) 103/53 (!) 113/59 (!) 121/59 115/63  Pulse:  81 91 67 68  Resp: 15 20 16 12   Temp: 98 F (36.7 C) 98.2 F (36.8 C)  98.4 F (36.9 C)  TempSrc: Oral Oral  Oral  SpO2: 98% 96% 100% 95%  Weight:      Height:          Data Reviewed:  Basic Metabolic Panel: Recent Labs  Lab 07/06/22 0438 07/07/22 0358 07/08/22 0529 07/09/22 0625 07/10/22 0428 07/11/22 0501 07/12/22 0427  NA 130* 129* 128* 126* 126* 124* 125*  K 3.8 4.7 4.9 4.1 4.6 4.8 5.4*  CL 90*  96* 95* 94* 93* 91* 91*  CO2 23 25 24 25 26 26 27   GLUCOSE 91 58* 196* 146* 188* 198* 191*  BUN 19 19 18 15 12 11 15   CREATININE 0.94 0.94 0.86 0.69 0.89 0.84 0.96  CALCIUM 8.4* 8.1* 8.7* 8.6* 8.3* 8.1* 8.2*  MG 1.7 2.0  --   --   --   --   --   PHOS 3.5  --   --   --   --   --   --     CBC: Recent Labs  Lab 07/01/2022 1947 07/06/22 0438 07/10/22 0428  WBC 26.3* 21.6* 13.6*  NEUTROABS  --  18.9*  --   HGB 9.5* 9.7* 8.3*  HCT 30.0* 29.9* 25.8*  MCV 89.3 90.3 87.8  PLT PLATELET CLUMPS NOTED ON SMEAR, UNABLE TO ESTIMATE PLATELET CLUMPS NOTED ON SMEAR, UNABLE TO ESTIMATE 154    LFT Recent Labs  Lab 07/06/22 0438  AST 24  ALT 24  ALKPHOS 144*  BILITOT 0.6  PROT 5.6*  ALBUMIN 2.0*     Antibiotics: Anti-infectives (From admission, onward)    Start     Dose/Rate Route Frequency Ordered Stop   07/08/22 2000  DAPTOmycin (CUBICIN) 600 mg in sodium chloride 0.9 % IVPB        600 mg 124 mL/hr over 30 Minutes Intravenous Daily 07/08/22 1612     07/06/22 2200  vancomycin (VANCOCIN) IVPB 1000 mg/200 mL premix  Status:  Discontinued        1,000 mg 200 mL/hr over 60 Minutes Intravenous Every 36 hours 06/27/2022 0738 06/26/2022 1221   07/06/22 1000  vancomycin (VANCOREADY) IVPB 750 mg/150 mL  Status:  Discontinued        750 mg 150 mL/hr over 60 Minutes Intravenous Daily 07/26/2022 1221 07/17/2022 2031   07/06/22 0700  vancomycin (VANCOCIN) IVPB 1000 mg/200 mL premix  Status:  Discontinued        1,000 mg 200 mL/hr over 60 Minutes Intravenous Daily 07/04/2022 2031 07/08/22 1612   07/16/2022 2200  cefTRIAXone (ROCEPHIN) 2 g in sodium chloride 0.9 % 100 mL IVPB  Status:  Discontinued        2 g 200 mL/hr over 30 Minutes Intravenous Every 12 hours 07/08/2022 1932 07/06/22 1128   07/23/2022 1746  vancomycin (VANCOCIN) powder  Status:  Discontinued          As needed 07/07/2022 1747 06/28/2022 1812   07/01/2022 0900  vancomycin (VANCOREADY) IVPB 1500 mg/300 mL        1,500 mg 150 mL/hr over 120 Minutes  Intravenous  Once 07/04/2022 0738 07/24/2022 1102   07/22/2022 0300  cefTRIAXone (ROCEPHIN) 2 g in sodium chloride 0.9 % 100 mL IVPB  Status:  Discontinued        2 g 200 mL/hr over 30 Minutes Intravenous Every 24 hours 07/04/22 0821 07/13/2022 1607   07/04/22 1800  cefTRIAXone (ROCEPHIN)  1 g in sodium chloride 0.9 % 100 mL IVPB  Status:  Discontinued        1 g 200 mL/hr over 30 Minutes Intravenous Every 24 hours 07/04/22 0216 07/04/22 0821   07/04/22 1800  azithromycin (ZITHROMAX) 500 mg in sodium chloride 0.9 % 250 mL IVPB  Status:  Discontinued        500 mg 250 mL/hr over 60 Minutes Intravenous Every 24 hours 07/04/22 0216 07/16/2022 1607   07/04/22 0145  cefTRIAXone (ROCEPHIN) 2 g in sodium chloride 0.9 % 100 mL IVPB        2 g 200 mL/hr over 30 Minutes Intravenous  Once 07/04/22 0136 07/04/22 0247   07/04/22 0145  azithromycin (ZITHROMAX) 500 mg in sodium chloride 0.9 % 250 mL IVPB        500 mg 250 mL/hr over 60 Minutes Intravenous  Once 07/04/22 0136 07/04/22 0544        DVT prophylaxis: SCDs  Code Status: Full code  Family Communication: Discussed with patient's son at bedside   CONSULTS    Objective    Physical Examination:  General-appears in no acute distress Heart-S1-S2, regular, no murmur auscultated Lungs-clear to auscultation bilaterally, no wheezing or crackles auscultated Abdomen-soft, nontender, no organomegaly Extremities-no edema in the lower extremities Neuro-alert, oriented x3, no focal deficit noted   Status is: Inpatient:             Oswald Hillock   [facility] If 7PM-7AM, please contact night-coverage at [email or web address], Office  [phone]   07/12/2022, 9:01 AM  LOS: 8 days

## 2022-07-13 DIAGNOSIS — E871 Hypo-osmolality and hyponatremia: Secondary | ICD-10-CM | POA: Diagnosis not present

## 2022-07-13 DIAGNOSIS — J189 Pneumonia, unspecified organism: Secondary | ICD-10-CM | POA: Diagnosis not present

## 2022-07-13 DIAGNOSIS — G061 Intraspinal abscess and granuloma: Secondary | ICD-10-CM | POA: Diagnosis not present

## 2022-07-13 DIAGNOSIS — R7881 Bacteremia: Secondary | ICD-10-CM | POA: Diagnosis not present

## 2022-07-13 LAB — GLUCOSE, CAPILLARY
Glucose-Capillary: 200 mg/dL — ABNORMAL HIGH (ref 70–99)
Glucose-Capillary: 174 mg/dL — ABNORMAL HIGH (ref 70–99)
Glucose-Capillary: 311 mg/dL — ABNORMAL HIGH (ref 70–99)
Glucose-Capillary: 311 mg/dL — ABNORMAL HIGH (ref 70–99)

## 2022-07-13 LAB — BASIC METABOLIC PANEL
Anion gap: 5 (ref 5–15)
BUN: 20 mg/dL (ref 8–23)
CO2: 29 mmol/L (ref 22–32)
Calcium: 7.9 mg/dL — ABNORMAL LOW (ref 8.9–10.3)
Chloride: 90 mmol/L — ABNORMAL LOW (ref 98–111)
Creatinine, Ser: 1.01 mg/dL — ABNORMAL HIGH (ref 0.44–1.00)
GFR, Estimated: 53 mL/min — ABNORMAL LOW (ref >60)
Glucose, Bld: 204 mg/dL — ABNORMAL HIGH (ref 70–99)
Potassium: 5.2 mmol/L — ABNORMAL HIGH (ref 3.5–5.1)
Sodium: 124 mmol/L — ABNORMAL LOW (ref 135–145)

## 2022-07-13 LAB — C-REACTIVE PROTEIN: CRP: 9.3 mg/dL — ABNORMAL HIGH (ref <1.0)

## 2022-07-13 MED ORDER — INSULIN ASPART 100 UNIT/ML IJ SOLN
15.0000 [IU] | Freq: Once | INTRAMUSCULAR | Status: AC
  Administered 2022-07-13: 15 [IU] via SUBCUTANEOUS

## 2022-07-13 MED ORDER — INSULIN GLARGINE-YFGN 100 UNIT/ML ~~LOC~~ SOLN
5.0000 [IU] | Freq: Every day | SUBCUTANEOUS | Status: DC
  Administered 2022-07-13 – 2022-07-19 (×7): 5 [IU] via SUBCUTANEOUS
  Filled 2022-07-13 (×7): qty 0.05

## 2022-07-13 MED ORDER — RIVAROXABAN 20 MG PO TABS
20.0000 mg | ORAL_TABLET | Freq: Every day | ORAL | Status: DC
  Administered 2022-07-13 – 2022-07-18 (×6): 20 mg via ORAL
  Filled 2022-07-13 (×6): qty 1

## 2022-07-13 NOTE — Progress Notes (Signed)
Physical Therapy Treatment Patient Details Name: [name] MRN: [id number] DOB: [birth year] Today's Date: 07/13/2022   History of Present Illness 89 yo female presenting 3/8 with abdominal pain. Work up revealed CAP. Pt transferred to [facility] on 3/10 after MRI showed "extensive epidural abscess extending from at least C7 / T1 caudally through the L1-2 with multifocal mod / severe compression of the spinal cord at T8-9, T10-11, T11-12" And is now s/p T9-11 laminectomy with evacuation of epidural abscess on 3/10. PMH includes: cellulitis of Left leg, CHF, COPD, DM II, glaucoma, HLD, and HTN.    PT Comments    Focused session on progressing all functional mobility independence while maintaining spinal precautions. Pt demonstrated improved initiation with all tasks, but needs cues for sequencing. Pt was able to roll with minA and transition sidelying to sit EOB, transfers, and ambulation up to ~5 ft with modA today. She needs multi-modal cues for weight shifting and advancement of her feet when ambulating. She fatigues quickly. Will continue to follow acutely. Current recommendations remain appropriate.     Recommendations for follow up therapy are one component of a multi-disciplinary discharge planning process, led by the attending physician.  Recommendations may be updated based on patient status, additional functional criteria and insurance authorization.  Follow Up Recommendations  Skilled nursing-short term rehab (<3 hours/day) Can patient physically be transported by private vehicle: No   Assistance Recommended at Discharge Frequent or constant Supervision/Assistance  Patient can return home with the following Two people to help with walking and/or transfers;Two people to help with bathing/dressing/bathroom;Direct supervision/assist for medications management;Direct supervision/assist for financial management;Assist for transportation;Help with stairs or ramp for entrance;Assistance with  feeding;Assistance with cooking/housework   Equipment Recommendations  Other (comment) (defer to post acute)    Recommendations for Other Services       Precautions / Restrictions Precautions Precautions: Fall;Back Precaution Booklet Issued: No Precaution Comments: needs cues to maintain precautions Required Braces or Orthoses:  (no brace needed orders) Restrictions Weight Bearing Restrictions: No     Mobility  Bed Mobility Overal bed mobility: Needs Assistance Bed Mobility: Rolling, Sidelying to Sit Rolling: Min assist Sidelying to sit: Mod assist, HOB elevated       General bed mobility comments: Verbal cues provided to flex R leg and reach R UE to L bed rail to log roll, minA to complete rotation of trunk. ModA to ascend trunk with pt bringing legs off EOB as cued.    Transfers Overall transfer level: Needs assistance Equipment used: Rolling walker (2 wheels) Transfers: Sit to/from Stand, Bed to chair/wheelchair/BSC Sit to Stand: Mod assist   Step pivot transfers: Mod assist       General transfer comment: Pt demonstrated improved initiation of her lower extremity muscles to transfer to stand. Cues provided for hand placement, x1 rep from EOB, x2 reps from recliner. ModA to power up to stand and gain balance due to noted posterior lean initially and modA for stability and RW management to transfer to L from EOB to recliner via step pivot transfer.    Ambulation/Gait Ambulation/Gait assistance: Mod assist, +2 safety/equipment Gait Distance (Feet): 5 Feet (x3 bouts of ~2 ft > ~5 ft > ~5 ft) Assistive device: Rolling walker (2 wheels) Gait Pattern/deviations: Step-to pattern, Decreased step length - right, Decreased step length - left, Decreased stride length, Decreased dorsiflexion - right, Decreased dorsiflexion - left, Decreased weight shift to right, Decreased weight shift to left, Leaning posteriorly, Trunk flexed Gait velocity: reduced Gait velocity  interpretation: <  1.31 ft/sec, indicative of household ambulator   General Gait Details: Pt needing verbal and tactile cues to advance either foot to step, tactile cues for weight shifting to improve foot clearance. Cues provided to reduce initial posterior lean as well. ModA for balance and RW management, chair follow for safety   Stairs             Wheelchair Mobility    Modified Rankin (Stroke Patients Only)       Balance Overall balance assessment: Needs assistance Sitting-balance support: Feet supported, Bilateral upper extremity supported Sitting balance-Leahy Scale: Poor Sitting balance - Comments: Min guard assist to sit EOB with UE support Postural control: Posterior lean Standing balance support: Bilateral upper extremity supported, During functional activity Standing balance-Leahy Scale: Poor Standing balance comment: ModA with bil UE support                            Cognition Arousal/Alertness: Awake/alert Behavior During Therapy: WFL for tasks assessed/performed Overall Cognitive Status: Impaired/Different from baseline Area of Impairment: Attention, Memory, Following commands, Safety/judgement, Awareness, Problem solving                   Current Attention Level: Sustained Memory: Decreased short-term memory, Decreased recall of precautions Following Commands: Follows one step commands consistently, Follows one step commands with increased time Safety/Judgement: Decreased awareness of safety, Decreased awareness of deficits Awareness: Intellectual Problem Solving: Slow processing, Decreased initiation, Difficulty sequencing, Requires verbal cues General Comments: HOH, needing extra time to process and repetition of cues. Needs cues to maintain back precautions. Poor awareness of deficits. Reports fear of falling. Improved initiation of leg muscles with transfers today. Needs cues for sequencing steps        Exercises      General  Comments General comments (skin integrity, edema, etc.): SpO2 down to high 80s% on 3L, bumped up to 4L for recovery, SpO2 in 90s% end of session on 3L      Pertinent Vitals/Pain Pain Assessment Pain Assessment: Faces Faces Pain Scale: Hurts little more Pain Location: generalized with movement Pain Descriptors / Indicators: Discomfort, Grimacing, Moaning Pain Intervention(s): Monitored during session, Limited activity within patient's tolerance, Repositioned    Home Living                          Prior Function            PT Goals (current goals can now be found in the care plan section) Acute Rehab PT Goals Patient Stated Goal: to not fall PT Goal Formulation: With patient/family Time For Goal Achievement: 07/20/22 Potential to Achieve Goals: Fair Progress towards PT goals: Progressing toward goals    Frequency    Min 3X/week      PT Plan Current plan remains appropriate    Co-evaluation              AM-PAC PT "6 Clicks" Mobility   Outcome Measure  Help needed turning from your back to your side while in a flat bed without using bedrails?: A Little Help needed moving from lying on your back to sitting on the side of a flat bed without using bedrails?: A Lot Help needed moving to and from a bed to a chair (including a wheelchair)?: A Lot Help needed standing up from a chair using your arms (e.g., wheelchair or bedside chair)?: A Lot Help needed to walk in hospital room?: Total Help  needed climbing 3-5 steps with a railing? : Total 6 Click Score: 11    End of Session Equipment Utilized During Treatment: Oxygen;Gait belt Activity Tolerance: Patient tolerated treatment well Patient left: with call bell/phone within reach;in chair;with chair alarm set;with family/visitor present   PT Visit Diagnosis: Other abnormalities of gait and mobility (R26.89);Muscle weakness (generalized) (M62.81);Unsteadiness on feet (R26.81);Difficulty in walking, not  elsewhere classified (R26.2)     Time: [id number] PT Time Calculation (min) (ACUTE ONLY): 27 min  Charges:  $Gait Training: 8-22 mins $Therapeutic Activity: 8-22 mins                     Moishe Spice, PT, DPT Acute Rehabilitation Services  Office: [place] 07/13/2022, 3:26 PM

## 2022-07-13 NOTE — Progress Notes (Signed)
Mobility Specialist: Progress Note   07/13/22 1426  Mobility  Activity Stood at bedside (x2)  Level of Assistance Moderate assist, patient does 50-74%  Assistive Device Stedy  Activity Response Tolerated well  Mobility Referral Yes  $Mobility charge 1 Mobility   Pt received in the chair and agreeable to mobility. Heavy modA to stand from the chair with physical assist for hand placement. Able to tolerate standing for 1 minute and 1.5 minutes. C/o 7/10 back pain, otherwise asymptomatic. Pt back to the chair after session with call bell and phone in reach. Chair alarm is on.   [place] Geisha Abernathy Mobility Specialist Please contact via SecureChat or Rehab office at [phone]

## 2022-07-13 NOTE — Inpatient Diabetes Management (Signed)
Inpatient Diabetes Program Recommendations  AACE/ADA: New Consensus Statement on Inpatient Glycemic Control (2015)  Target Ranges:  Prepandial:   less than 140 mg/dL      Peak postprandial:   less than 180 mg/dL (1-2 hours)      Critically ill patients:  140 - 180 mg/dL   Lab Results  Component Value Date   GLUCAP 200 (H) 07/13/2022   HGBA1C 9.1 (H) 07/06/2022    Review of Glycemic Control  Latest Reference Range & Units 07/12/22 06:28 07/12/22 11:51 07/12/22 15:51 07/12/22 21:25 07/13/22 06:38  Glucose-Capillary 70 - 99 mg/dL 176 (H)  Novolog 1 unit 184 (H)  Novolog 1 unit 275 (H)  Novolog 3 units 356 (H) 200 (H)   Diabetes history: DM 2 Outpatient Diabetes medications: 70/30 10 units bid, Metformin 500 mg Daily Current orders for Inpatient glycemic control:  Novolog 0-6 units tid  A1c 9.1% on 3/11  Inpatient Diabetes Program Recommendations:    Note: Pt on 70/30 insulin at home. Glucose trends increase after meal intake.  -  Consider Novolog 2 units tid meal coverage with parameters (if eating >50% of meals and glucose is at least 80) -  add Novolog hs scale  Thanks, Tama Headings RN, MSN, BC-ADM Inpatient Diabetes Coordinator Team Pager [phone] (8a-5p)

## 2022-07-13 NOTE — Progress Notes (Signed)
[facility]  PROGRESS NOTE  [name] [id number] DOB: 08/19/1930 DOA: 07/08/2022 PCP: [name], DO   Brief HPI:   89 year old female with past medical history of chronic hypoxemic respiratory failure on 4 L of oxygen, chronic diastolic heart failure, diabetes mellitus type 2, DVT on Xarelto admitted to [facility] long hospital on 07/04/2022 for right-sided chest discomfort, found to be septic due to pneumonia, started on Rocephin and Zithromax.  On 07/15/2022 blood culture positive for MRSA, imaging showed epidural abscess with cord compression, disc edema.  Neurosurgery was consulted, patient was transferred to [facility] for incision and drainage of T9, T10 and T11, surgical intervention with laminectomy.    Subjective   Patient seen and examined, denies any complaints.  No chest pain or shortness of breath.   Assessment/Plan:    Sepsis due to MRSA bacteremia -Extensive abscess of thoracic lumbar spine, T9, T10, T11 -S/p laminectomy and I&D / bilateral lower lobe pneumonia -Blood cultures grew MRSA on 07/06/2022 -Repeat blood cultures on 07/06/2022 remain negative to date -MRI of the spine was concerning for abscess, underwent I&D and laminectomy per neurosurgery on 07/02/2022 -ID consulted, TTE showed no wall motion normality, mild aortic stenosis. -ID recommends to hold off on TEE for now as repeat blood cultures are negative -Central line right IJ removed on 07/07/2022 -Vancomycin switched to daptomycin per ID  Delirium -Likely medication induced,  -she was started on Haldol and melatonin -Continue delirium precautions  Chronic respiratory failure with hypoxemia and hypercarbia -COPD, not in exacerbation -Patient is currently on 2 L of oxygen by nasal cannula -Wean off oxygen as tolerated  Chronic diastolic heart failure -Stable  Hypertension -Hold HCTZ, continue amlodipine, Imdur  Hypovolemic hyponatremia -Sodium is 125 this morning -serum osmolality: 271 -Will  change fluid restriction to 1.2 L/day  -Follow urine osmolality  History of DVT -Patient is on Xarelto at home which is currently on hold due to recent surgical intervention -Discussed with neurosurgery, will restart Xarelto.  - Will restart Xarelto   Diabetes mellitus type 2 -Hemoglobin A1c greater than 10.0 in January of this year -CBG well-controlled, has been on lower side -Continue very sensitive sliding scale with NovoLog  Hyperkalemia -Mild, potassium is 5.2 -Follow BMP in am    Medications     amLODipine  10 mg Oral Daily   brimonidine  1 drop Both Eyes Daily   brinzolamide  1 drop Both Eyes Daily   Chlorhexidine Gluconate Cloth  6 each Topical Daily   Gerhardt's butt cream   Topical Daily   insulin aspart  0-6 Units Subcutaneous TID WC   insulin glargine-yfgn  5 Units Subcutaneous Daily   isosorbide mononitrate  60 mg Oral Daily   latanoprost  1 drop Both Eyes QHS   metoprolol tartrate  12.5 mg Oral BID   pantoprazole  40 mg Oral Daily   polyethylene glycol  17 g Oral Daily   senna-docusate  1 tablet Oral BID   sodium chloride flush  3 mL Intravenous Q12H   sodium chloride  2 g Oral BID WC   umeclidinium-vilanterol  1 puff Inhalation Daily     Data Reviewed:   CBG:  Recent Labs  Lab 07/12/22 0628 07/12/22 1151 07/12/22 1551 07/12/22 2125 07/13/22 0638  GLUCAP 176* 184* 275* 356* 200*    SpO2: 90 % O2 Flow Rate (L/min): 3 L/min FiO2 (%): 36 %    Vitals:   07/12/22 2305 07/13/22 0208 07/13/22 0217 07/13/22 0803  BP: (!) 142/63 127/74  139/64  Pulse: 74 76 73 70  Resp: 17 14 19 18   Temp: 98.8 F (37.1 C)   98.6 F (37 C)  TempSrc: Oral   Axillary  SpO2: 96% 98% 94% 90%  Weight:      Height:          Data Reviewed:  Basic Metabolic Panel: Recent Labs  Lab 07/07/22 0358 07/08/22 0529 07/09/22 0625 07/10/22 0428 07/11/22 0501 07/12/22 0427 07/13/22 0633  NA 129*   < > 126* 126* 124* 125* 124*  K 4.7   < > 4.1 4.6 4.8 5.4*  5.2*  CL 96*   < > 94* 93* 91* 91* 90*  CO2 25   < > 25 26 26 27 29   GLUCOSE 58*   < > 146* 188* 198* 191* 204*  BUN 19   < > 15 12 11 15 20   CREATININE 0.94   < > 0.69 0.89 0.84 0.96 1.01*  CALCIUM 8.1*   < > 8.6* 8.3* 8.1* 8.2* 7.9*  MG 2.0  --   --   --   --   --   --    < > = values in this interval not displayed.    CBC: Recent Labs  Lab 07/10/22 0428  WBC 13.6*  HGB 8.3*  HCT 25.8*  MCV 87.8  PLT 154    LFT No results for input(s): "AST", "ALT", "ALKPHOS", "BILITOT", "PROT", "ALBUMIN" in the last 168 hours.    Antibiotics: Anti-infectives (From admission, onward)    Start     Dose/Rate Route Frequency Ordered Stop   07/08/22 2000  DAPTOmycin (CUBICIN) 600 mg in sodium chloride 0.9 % IVPB        600 mg 124 mL/hr over 30 Minutes Intravenous Daily 07/08/22 1612     07/06/22 2200  vancomycin (VANCOCIN) IVPB 1000 mg/200 mL premix  Status:  Discontinued        1,000 mg 200 mL/hr over 60 Minutes Intravenous Every 36 hours 07/12/2022 0738 07/08/2022 1221   07/06/22 1000  vancomycin (VANCOREADY) IVPB 750 mg/150 mL  Status:  Discontinued        750 mg 150 mL/hr over 60 Minutes Intravenous Daily 07/18/2022 1221 07/20/2022 2031   07/06/22 0700  vancomycin (VANCOCIN) IVPB 1000 mg/200 mL premix  Status:  Discontinued        1,000 mg 200 mL/hr over 60 Minutes Intravenous Daily 07/02/2022 2031 07/08/22 1612   07/17/2022 2200  cefTRIAXone (ROCEPHIN) 2 g in sodium chloride 0.9 % 100 mL IVPB  Status:  Discontinued        2 g 200 mL/hr over 30 Minutes Intravenous Every 12 hours 07/02/2022 1932 07/06/22 1128   06/26/2022 1746  vancomycin (VANCOCIN) powder  Status:  Discontinued          As needed 07/01/2022 1747 07/02/2022 1812   07/02/2022 0900  vancomycin (VANCOREADY) IVPB 1500 mg/300 mL        1,500 mg 150 mL/hr over 120 Minutes Intravenous  Once 07/12/2022 0738 06/30/2022 1102   07/25/2022 0300  cefTRIAXone (ROCEPHIN) 2 g in sodium chloride 0.9 % 100 mL IVPB  Status:  Discontinued        2 g 200 mL/hr  over 30 Minutes Intravenous Every 24 hours 07/04/22 0821 07/04/2022 1607   07/04/22 1800  cefTRIAXone (ROCEPHIN) 1 g in sodium chloride 0.9 % 100 mL IVPB  Status:  Discontinued        1 g 200 mL/hr over 30 Minutes Intravenous Every 24  hours 07/04/22 0216 07/04/22 0821   07/04/22 1800  azithromycin (ZITHROMAX) 500 mg in sodium chloride 0.9 % 250 mL IVPB  Status:  Discontinued        500 mg 250 mL/hr over 60 Minutes Intravenous Every 24 hours 07/04/22 0216 07/15/2022 1607   07/04/22 0145  cefTRIAXone (ROCEPHIN) 2 g in sodium chloride 0.9 % 100 mL IVPB        2 g 200 mL/hr over 30 Minutes Intravenous  Once 07/04/22 0136 07/04/22 0247   07/04/22 0145  azithromycin (ZITHROMAX) 500 mg in sodium chloride 0.9 % 250 mL IVPB        500 mg 250 mL/hr over 60 Minutes Intravenous  Once 07/04/22 0136 07/04/22 0544        DVT prophylaxis: SCDs  Code Status: Full code  Family Communication: Discussed with patient's son at bedside   CONSULTS    Objective    Physical Examination:  General-appears in no acute distress Heart-S1-S2, regular, no murmur auscultated Lungs-clear to auscultation bilaterally Abdomen-soft, nontender, no organomegaly Extremities-no edema in the lower extremities Neuro-alert, oriented x3, no focal deficit noted  Status is: Inpatient:             Oswald Hillock   [facility] If 7PM-7AM, please contact night-coverage at [email or web address], Office  [phone]   07/13/2022, 9:03 AM  LOS: 9 days

## 2022-07-14 DIAGNOSIS — R7881 Bacteremia: Secondary | ICD-10-CM | POA: Diagnosis not present

## 2022-07-14 DIAGNOSIS — E871 Hypo-osmolality and hyponatremia: Secondary | ICD-10-CM | POA: Diagnosis not present

## 2022-07-14 DIAGNOSIS — G062 Extradural and subdural abscess, unspecified: Secondary | ICD-10-CM | POA: Diagnosis not present

## 2022-07-14 DIAGNOSIS — M4620 Osteomyelitis of vertebra, site unspecified: Secondary | ICD-10-CM | POA: Diagnosis not present

## 2022-07-14 LAB — MISC LABCORP TEST (SEND OUT): Labcorp test code: 96388

## 2022-07-14 LAB — BASIC METABOLIC PANEL
Anion gap: 7 (ref 5–15)
Anion gap: 10 (ref 5–15)
BUN: 25 mg/dL — ABNORMAL HIGH (ref 8–23)
BUN: 29 mg/dL — ABNORMAL HIGH (ref 8–23)
CO2: 27 mmol/L (ref 22–32)
CO2: 25 mmol/L (ref 22–32)
Calcium: 7.9 mg/dL — ABNORMAL LOW (ref 8.9–10.3)
Calcium: 8 mg/dL — ABNORMAL LOW (ref 8.9–10.3)
Chloride: 93 mmol/L — ABNORMAL LOW (ref 98–111)
Chloride: 91 mmol/L — ABNORMAL LOW (ref 98–111)
Creatinine, Ser: 1.03 mg/dL — ABNORMAL HIGH (ref 0.44–1.00)
Creatinine, Ser: 1.11 mg/dL — ABNORMAL HIGH (ref 0.44–1.00)
GFR, Estimated: 52 mL/min — ABNORMAL LOW (ref >60)
GFR, Estimated: 48 mL/min — ABNORMAL LOW (ref >60)
Glucose, Bld: 77 mg/dL (ref 70–99)
Glucose, Bld: 262 mg/dL — ABNORMAL HIGH (ref 70–99)
Potassium: 5 mmol/L (ref 3.5–5.1)
Potassium: 5.2 mmol/L — ABNORMAL HIGH (ref 3.5–5.1)
Sodium: 127 mmol/L — ABNORMAL LOW (ref 135–145)
Sodium: 126 mmol/L — ABNORMAL LOW (ref 135–145)

## 2022-07-14 LAB — GLUCOSE, CAPILLARY
Glucose-Capillary: 80 mg/dL (ref 70–99)
Glucose-Capillary: 261 mg/dL — ABNORMAL HIGH (ref 70–99)
Glucose-Capillary: 242 mg/dL — ABNORMAL HIGH (ref 70–99)
Glucose-Capillary: 220 mg/dL — ABNORMAL HIGH (ref 70–99)
Glucose-Capillary: 218 mg/dL — ABNORMAL HIGH (ref 70–99)

## 2022-07-14 MED ORDER — FUROSEMIDE 10 MG/ML IJ SOLN
20.0000 mg | Freq: Once | INTRAMUSCULAR | Status: AC
  Administered 2022-07-14: 20 mg via INTRAVENOUS
  Filled 2022-07-14: qty 4

## 2022-07-14 NOTE — Progress Notes (Addendum)
[facility]  PROGRESS NOTE  [name] [id number] DOB: 10/09/1930 DOA: 07/23/2022 PCP: [name], DO   Brief HPI:   89 year old female with past medical history of chronic hypoxemic respiratory failure on 4 L of oxygen, chronic diastolic heart failure, diabetes mellitus type 2, DVT on Xarelto admitted to [facility] long hospital on 07/04/2022 for right-sided chest discomfort, found to be septic due to pneumonia, started on Rocephin and Zithromax.  On 07/15/2022 blood culture positive for MRSA, imaging showed epidural abscess with cord compression, disc edema.  Neurosurgery was consulted, patient was transferred to [facility] for incision and drainage of T9, T10 and T11, surgical intervention with laminectomy.    Subjective   Patient seen and examined, denies any complaints.   Assessment/Plan:    Sepsis due to MRSA bacteremia -Extensive abscess of thoracic lumbar spine, T9, T10, T11 -S/p laminectomy and I&D / bilateral lower lobe pneumonia -Blood cultures grew MRSA on 07/20/2022 -Repeat blood cultures on 07/06/2022 remain negative to date -MRI of the spine was concerning for abscess, underwent I&D and laminectomy per neurosurgery on 07/26/2022 -ID consulted, TTE showed no wall motion normality, mild aortic stenosis. -ID recommends to hold off on TEE for now as repeat blood cultures are negative -Central line right IJ removed on 07/07/2022 -Vancomycin switched to daptomycin per ID  -As per ID recommendations Continue daptomycin Snf orders with daptomycin Weekly cbc, cmp, crp and cpk Fax weekly labs to [phone]  Delirium -Likely medication induced,  -she was started on Haldol and melatonin -Continue delirium precautions  Chronic respiratory failure with hypoxemia and hypercarbia -COPD, not in exacerbation -Patient is currently on 2 L of oxygen by nasal cannula -Wean off oxygen as tolerated  Acute on chronic diastolic heart failure -Patient is requiring 2 to 3 L of  oxygen via nasal cannula -She has bibasilar crackles on auscultation -Will give 1 dose of Lasix 20 mg IV -Follow strict intake and output  Hypertension -Hold HCTZ, continue amlodipine, Imdur  Hypervolemic hyponatremia -Sodium has improved to 127 this morning -serum osmolality: 271 -Continue fluid restriction to 1.2 L/day  -Follow urine osmolality -Follow serum sodium in a.m.  History of DVT -Patient is on Xarelto at home which is currently on hold due to recent surgical intervention -Discussed with neurosurgery, will restart Xarelto.  -Continue Xarelto   Diabetes mellitus type 2 -Hemoglobin A1c greater than 10.0 in January of this year -CBG well-controlled -Continue very sensitive sliding scale with NovoLog -Started on Semglee 5 units subcu daily  Hyperkalemia -Resolved    Medications     amLODipine  10 mg Oral Daily   brimonidine  1 drop Both Eyes Daily   brinzolamide  1 drop Both Eyes Daily   Chlorhexidine Gluconate Cloth  6 each Topical Daily   furosemide  20 mg Intravenous Once   Gerhardt's butt cream   Topical Daily   insulin aspart  0-6 Units Subcutaneous TID WC   insulin glargine-yfgn  5 Units Subcutaneous Daily   isosorbide mononitrate  60 mg Oral Daily   latanoprost  1 drop Both Eyes QHS   metoprolol tartrate  12.5 mg Oral BID   pantoprazole  40 mg Oral Daily   polyethylene glycol  17 g Oral Daily   rivaroxaban  20 mg Oral Q supper   senna-docusate  1 tablet Oral BID   sodium chloride flush  3 mL Intravenous Q12H   sodium chloride  2 g Oral BID WC   umeclidinium-vilanterol  1 puff Inhalation Daily  Data Reviewed:   CBG:  Recent Labs  Lab 07/13/22 0638 07/13/22 1147 07/13/22 1636 07/13/22 2148 07/14/22 0609  GLUCAP 200* 174* 311* 311* 80    SpO2: 94 % O2 Flow Rate (L/min): 2 L/min FiO2 (%): 36 %    Vitals:   07/13/22 2138 07/14/22 0029 07/14/22 0321 07/14/22 0500  BP: 127/65 120/78 (!) 120/51   Pulse: 94  76   Resp:  19 18    Temp:  97.8 F (36.6 C) 97.7 F (36.5 C)   TempSrc:  Oral Oral   SpO2:  100% 94%   Weight:    98 kg  Height:          Data Reviewed:  Basic Metabolic Panel: Recent Labs  Lab 07/10/22 0428 07/11/22 0501 07/12/22 0427 07/13/22 0633 07/14/22 0247  NA 126* 124* 125* 124* 127*  K 4.6 4.8 5.4* 5.2* 5.0  CL 93* 91* 91* 90* 93*  CO2 26 26 27 29 27   GLUCOSE 188* 198* 191* 204* 77  BUN 12 11 15 20  25*  CREATININE 0.89 0.84 0.96 1.01* 1.03*  CALCIUM 8.3* 8.1* 8.2* 7.9* 7.9*    CBC: Recent Labs  Lab 07/10/22 0428  WBC 13.6*  HGB 8.3*  HCT 25.8*  MCV 87.8  PLT 154    LFT No results for input(s): "AST", "ALT", "ALKPHOS", "BILITOT", "PROT", "ALBUMIN" in the last 168 hours.    Antibiotics: Anti-infectives (From admission, onward)    Start     Dose/Rate Route Frequency Ordered Stop   07/08/22 2000  DAPTOmycin (CUBICIN) 600 mg in sodium chloride 0.9 % IVPB        600 mg 124 mL/hr over 30 Minutes Intravenous Daily 07/08/22 1612     07/06/22 2200  vancomycin (VANCOCIN) IVPB 1000 mg/200 mL premix  Status:  Discontinued        1,000 mg 200 mL/hr over 60 Minutes Intravenous Every 36 hours 07/11/2022 0738 07/04/2022 1221   07/06/22 1000  vancomycin (VANCOREADY) IVPB 750 mg/150 mL  Status:  Discontinued        750 mg 150 mL/hr over 60 Minutes Intravenous Daily 07/24/2022 1221 07/06/2022 2031   07/06/22 0700  vancomycin (VANCOCIN) IVPB 1000 mg/200 mL premix  Status:  Discontinued        1,000 mg 200 mL/hr over 60 Minutes Intravenous Daily 06/28/2022 2031 07/08/22 1612   07/25/2022 2200  cefTRIAXone (ROCEPHIN) 2 g in sodium chloride 0.9 % 100 mL IVPB  Status:  Discontinued        2 g 200 mL/hr over 30 Minutes Intravenous Every 12 hours 07/08/2022 1932 07/06/22 1128   06/28/2022 1746  vancomycin (VANCOCIN) powder  Status:  Discontinued          As needed 07/14/2022 1747 07/09/2022 1812   07/04/2022 0900  vancomycin (VANCOREADY) IVPB 1500 mg/300 mL        1,500 mg 150 mL/hr over 120 Minutes  Intravenous  Once 06/27/2022 0738 07/14/2022 1102   07/23/2022 0300  cefTRIAXone (ROCEPHIN) 2 g in sodium chloride 0.9 % 100 mL IVPB  Status:  Discontinued        2 g 200 mL/hr over 30 Minutes Intravenous Every 24 hours 07/04/22 0821 07/15/2022 1607   07/04/22 1800  cefTRIAXone (ROCEPHIN) 1 g in sodium chloride 0.9 % 100 mL IVPB  Status:  Discontinued        1 g 200 mL/hr over 30 Minutes Intravenous Every 24 hours 07/04/22 0216 07/04/22 0821   07/04/22 1800  azithromycin (ZITHROMAX)  500 mg in sodium chloride 0.9 % 250 mL IVPB  Status:  Discontinued        500 mg 250 mL/hr over 60 Minutes Intravenous Every 24 hours 07/04/22 0216 07/24/2022 1607   07/04/22 0145  cefTRIAXone (ROCEPHIN) 2 g in sodium chloride 0.9 % 100 mL IVPB        2 g 200 mL/hr over 30 Minutes Intravenous  Once 07/04/22 0136 07/04/22 0247   07/04/22 0145  azithromycin (ZITHROMAX) 500 mg in sodium chloride 0.9 % 250 mL IVPB        500 mg 250 mL/hr over 60 Minutes Intravenous  Once 07/04/22 0136 07/04/22 0544        DVT prophylaxis: SCDs  Code Status: Full code  Family Communication: Discussed with patient's son at bedside   CONSULTS    Objective    Physical Examination:  General-appears in no acute distress Heart-S1-S2, regular, no murmur auscultated Lungs-bibasilar crackles auscultated Abdomen-soft, nontender, no organomegaly Extremities-no edema in the lower extremities Neuro-alert, oriented x3, no focal deficit noted  Status is: Inpatient:             Oswald Hillock   [facility] If 7PM-7AM, please contact night-coverage at [email or web address], Office  [phone]   07/14/2022, 8:31 AM  LOS: 10 days

## 2022-07-14 NOTE — Inpatient Diabetes Management (Signed)
Inpatient Diabetes Program Recommendations  AACE/ADA: New Consensus Statement on Inpatient Glycemic Control (2015)  Target Ranges:  Prepandial:   less than 140 mg/dL      Peak postprandial:   less than 180 mg/dL (1-2 hours)      Critically ill patients:  140 - 180 mg/dL   Lab Results  Component Value Date   GLUCAP 80 07/14/2022   HGBA1C 9.1 (H) 07/06/2022    Review of Glycemic Control  Latest Reference Range & Units 07/13/22 06:38 07/13/22 11:47 07/13/22 16:36 07/13/22 21:48 07/14/22 06:09  Glucose-Capillary 70 - 99 mg/dL 200 (H) 174 (H) 311 (H) 311 (H) 80   Diabetes history: DM 2 Outpatient Diabetes medications: 70/30 10 units bid, Metformin 500 mg Daily Current orders for Inpatient glycemic control:  Novolog 0-6 units tid Semglee 5 units Daily  A1c 9.1% on 3/11 Regular diet  Inpatient Diabetes Program Recommendations:    Note: Fasting glucose lower this am after Semglee was added yesterday.  Pt on 70/30 insulin at home. Glucose trends increase after meal intake.  -  Consider Novolog 2 units tid meal coverage with parameters (if eating >50% of meals and glucose is at least 80) -  add Novolog hs scale  Thanks, Tama Headings RN, MSN, BC-ADM Inpatient Diabetes Coordinator Team Pager [phone] (8a-5p)

## 2022-07-14 NOTE — Progress Notes (Signed)
Occupational Therapy Treatment Patient Details Name: [name] MRN: [id number] DOB: 10/04/1930 Today's Date: 07/14/2022   History of present illness 89 yo female presenting 3/8 with abdominal pain. Work up revealed CAP. Pt transferred to [facility] on 3/10 after MRI showed "extensive epidural abscess extending from at least C7 / T1 caudally through the L1-2 with multifocal mod / severe compression of the spinal cord at T8-9, T10-11, T11-12" And is now s/p T9-11 laminectomy with evacuation of epidural abscess on 3/10. PMH includes: cellulitis of Left leg, CHF, COPD, DM II, glaucoma, HLD, and HTN.   OT comments  Patient continues to make good gains with OT treatment. Patient requiring mod assist to get to EOB with education on log rolling technique and back precautions. Patient required extra time before transfer to recliner for education on pursed lip breathing. Patient progressing with transfers to recliner with mod assist using RW and cues for hand placement and safety. Patient has good potential for further progress with continued OT treatment to address functional transfers and self care.    Recommendations for follow up therapy are one component of a multi-disciplinary discharge planning process, led by the attending physician.  Recommendations may be updated based on patient status, additional functional criteria and insurance authorization.    Follow Up Recommendations  Skilled nursing-short term rehab (<3 hours/day)     Assistance Recommended at Discharge Frequent or constant Supervision/Assistance  Patient can return home with the following  Assistance with cooking/housework;Direct supervision/assist for medications management;Direct supervision/assist for financial management;Help with stairs or ramp for entrance;Assist for transportation;Two people to help with walking and/or transfers;Two people to help with bathing/dressing/bathroom   Equipment Recommendations  Other (comment) (TBD)     Recommendations for Other Services      Precautions / Restrictions Precautions Precautions: Fall;Back Precaution Booklet Issued: No Precaution Comments: needs cues to maintain precautions Restrictions Weight Bearing Restrictions: No       Mobility Bed Mobility Overal bed mobility: Needs Assistance Bed Mobility: Rolling, Sidelying to Sit Rolling: Min assist Sidelying to sit: Mod assist, HOB elevated       General bed mobility comments: verbal cues for log rolling technique and assistance to raise trunk    Transfers Overall transfer level: Needs assistance Equipment used: Rolling walker (2 wheels) Transfers: Sit to/from Stand, Bed to chair/wheelchair/BSC Sit to Stand: Mod assist     Step pivot transfers: Mod assist     General transfer comment: cues for safety and hand placement     Balance Overall balance assessment: Needs assistance Sitting-balance support: Feet supported, Bilateral upper extremity supported Sitting balance-Leahy Scale: Poor Sitting balance - Comments: min guard for safety with sitting on EOB and no LOB   Standing balance support: Bilateral upper extremity supported, During functional activity Standing balance-Leahy Scale: Poor Standing balance comment: reliant on RW for support                           ADL either performed or assessed with clinical judgement   ADL Overall ADL's : Needs assistance/impaired     Grooming: Wash/dry hands;Wash/dry face;Oral care;Supervision/safety;Sitting Grooming Details (indicate cue type and reason): seated in recliner             Lower Body Dressing: Total assistance;Bed level Lower Body Dressing Details (indicate cue type and reason): to donn socks Toilet Transfer: Moderate assistance;Rolling walker (2 wheels) Toilet Transfer Details (indicate cue type and reason): simulated to recliner  General ADL Comments: HOH and requiring cue fo maintain back precautions     Extremity/Trunk Assessment              Vision       Perception     Praxis      Cognition Arousal/Alertness: Awake/alert Behavior During Therapy: WFL for tasks assessed/performed Overall Cognitive Status: Impaired/Different from baseline Area of Impairment: Attention, Memory, Following commands, Safety/judgement, Awareness, Problem solving                 Orientation Level: Disoriented to, Time, Situation Current Attention Level: Sustained Memory: Decreased short-term memory, Decreased recall of precautions Following Commands: Follows one step commands consistently, Follows one step commands with increased time Safety/Judgement: Decreased awareness of safety, Decreased awareness of deficits Awareness: Intellectual Problem Solving: Slow processing, Decreased initiation, Difficulty sequencing, Requires verbal cues General Comments: HOH requires extra time follow directions with cues for back precautions        Exercises      Shoulder Instructions       General Comments cues for pursed lip breathing with 2 liters of O2 and SpO2 in 90's    Pertinent Vitals/ Pain       Pain Assessment Pain Assessment: Faces Faces Pain Scale: Hurts little more Pain Location: generalized with movement Pain Descriptors / Indicators: Discomfort, Grimacing, Moaning Pain Intervention(s): Limited activity within patient's tolerance, Monitored during session, Repositioned  Home Living                                          Prior Functioning/Environment              Frequency  Min 2X/week        Progress Toward Goals  OT Goals(current goals can now be found in the care plan section)  Progress towards OT goals: Progressing toward goals  Acute Rehab OT Goals Patient Stated Goal: get better Time For Goal Achievement: 07/10/2022 Potential to Achieve Goals: Fair ADL Goals Pt Will Perform Lower Body Dressing: with supervision;sit to/from stand Pt  Will Transfer to Toilet: with supervision;ambulating;regular height toilet;grab bars Pt Will Perform Toileting - Clothing Manipulation and hygiene: with supervision;sit to/from stand Additional ADL Goal #1: Patient will perform 10 min functional activity or exercise activity as evidence of improving activity tolerance  Plan Discharge plan remains appropriate;Frequency remains appropriate    Co-evaluation                 AM-PAC OT "6 Clicks" Daily Activity     Outcome Measure   Help from another person eating meals?: A Little Help from another person taking care of personal grooming?: A Little Help from another person toileting, which includes using toliet, bedpan, or urinal?: A Lot Help from another person bathing (including washing, rinsing, drying)?: A Lot Help from another person to put on and taking off regular upper body clothing?: A Lot Help from another person to put on and taking off regular lower body clothing?: Total 6 Click Score: 13    End of Session Equipment Utilized During Treatment: Gait belt;Rolling walker (2 wheels);Oxygen  OT Visit Diagnosis: Muscle weakness (generalized) (M62.81);Pain;Unsteadiness on feet (R26.81)   Activity Tolerance Patient tolerated treatment well;Patient limited by fatigue   Patient Left in chair;with call bell/phone within reach;with chair alarm set;with family/visitor present   Nurse Communication Mobility status        Time: [id number] OT  Time Calculation (min): 23 min  Charges: OT General Charges $OT Visit: 1 Visit OT Treatments $Self Care/Home Management : 23-37 mins  Lodema Hong, [place]  Office [place] 07/14/2022, 11:52 AM

## 2022-07-14 NOTE — Plan of Care (Signed)
  Problem: Education: Goal: Ability to describe self-care measures that may prevent or decrease complications (Diabetes Survival Skills Education) will improve Outcome: Progressing Goal: Individualized Educational Video(s) Outcome: Progressing   Problem: Coping: Goal: Ability to adjust to condition or change in health will improve Outcome: Progressing   Problem: Fluid Volume: Goal: Ability to maintain a balanced intake and output will improve Outcome: Progressing   Problem: Health Behavior/Discharge Planning: Goal: Ability to identify and utilize available resources and services will improve Outcome: Progressing Goal: Ability to manage health-related needs will improve Outcome: Progressing   Problem: Metabolic: Goal: Ability to maintain appropriate glucose levels will improve Outcome: Progressing   Problem: Nutritional: Goal: Maintenance of adequate nutrition will improve Outcome: Progressing Goal: Progress toward achieving an optimal weight will improve Outcome: Progressing   Problem: Skin Integrity: Goal: Risk for impaired skin integrity will decrease Outcome: Progressing   Problem: Tissue Perfusion: Goal: Adequacy of tissue perfusion will improve Outcome: Progressing   Problem: Education: Goal: Knowledge of General Education information will improve Description: Including pain rating scale, medication(s)/side effects and non-pharmacologic comfort measures Outcome: Progressing   Problem: Health Behavior/Discharge Planning: Goal: Ability to manage health-related needs will improve Outcome: Progressing   Problem: Clinical Measurements: Goal: Ability to maintain clinical measurements within normal limits will improve Outcome: Progressing Goal: Will remain free from infection Outcome: Progressing Goal: Diagnostic test results will improve Outcome: Progressing Goal: Respiratory complications will improve Outcome: Progressing Goal: Cardiovascular complication will  be avoided Outcome: Progressing   Problem: Activity: Goal: Risk for activity intolerance will decrease Outcome: Progressing   Problem: Nutrition: Goal: Adequate nutrition will be maintained Outcome: Progressing   Problem: Coping: Goal: Level of anxiety will decrease Outcome: Progressing   Problem: Elimination: Goal: Will not experience complications related to bowel motility Outcome: Progressing Goal: Will not experience complications related to urinary retention Outcome: Progressing   Problem: Pain Managment: Goal: General experience of comfort will improve Outcome: Progressing   Problem: Safety: Goal: Ability to remain free from injury will improve Outcome: Progressing   Problem: Skin Integrity: Goal: Risk for impaired skin integrity will decrease Outcome: Progressing   Problem: Education: Goal: Ability to verbalize activity precautions or restrictions will improve Outcome: Progressing Goal: Knowledge of the prescribed therapeutic regimen will improve Outcome: Progressing Goal: Understanding of discharge needs will improve Outcome: Progressing   Problem: Activity: Goal: Ability to avoid complications of mobility impairment will improve Outcome: Progressing Goal: Ability to tolerate increased activity will improve Outcome: Progressing Goal: Will remain free from falls Outcome: Progressing   Problem: Bowel/Gastric: Goal: Gastrointestinal status for postoperative course will improve Outcome: Progressing   Problem: Clinical Measurements: Goal: Ability to maintain clinical measurements within normal limits will improve Outcome: Progressing Goal: Postoperative complications will be avoided or minimized Outcome: Progressing Goal: Diagnostic test results will improve Outcome: Progressing   Problem: Pain Management: Goal: Pain level will decrease Outcome: Progressing   Problem: Skin Integrity: Goal: Will show signs of wound healing Outcome: Progressing    Problem: Health Behavior/Discharge Planning: Goal: Identification of resources available to assist in meeting health care needs will improve Outcome: Progressing   Problem: Bladder/Genitourinary: Goal: Urinary functional status for postoperative course will improve Outcome: Progressing   

## 2022-07-15 DIAGNOSIS — M4620 Osteomyelitis of vertebra, site unspecified: Secondary | ICD-10-CM | POA: Diagnosis not present

## 2022-07-15 DIAGNOSIS — G061 Intraspinal abscess and granuloma: Secondary | ICD-10-CM | POA: Diagnosis not present

## 2022-07-15 DIAGNOSIS — R7881 Bacteremia: Secondary | ICD-10-CM | POA: Diagnosis not present

## 2022-07-15 DIAGNOSIS — B9562 Methicillin resistant Staphylococcus aureus infection as the cause of diseases classified elsewhere: Secondary | ICD-10-CM | POA: Diagnosis not present

## 2022-07-15 LAB — GLUCOSE, CAPILLARY
Glucose-Capillary: 173 mg/dL — ABNORMAL HIGH (ref 70–99)
Glucose-Capillary: 172 mg/dL — ABNORMAL HIGH (ref 70–99)
Glucose-Capillary: 162 mg/dL — ABNORMAL HIGH (ref 70–99)
Glucose-Capillary: 184 mg/dL — ABNORMAL HIGH (ref 70–99)
Glucose-Capillary: 211 mg/dL — ABNORMAL HIGH (ref 70–99)

## 2022-07-15 LAB — BASIC METABOLIC PANEL
Anion gap: 6 (ref 5–15)
BUN: 29 mg/dL — ABNORMAL HIGH (ref 8–23)
CO2: 27 mmol/L (ref 22–32)
Calcium: 8 mg/dL — ABNORMAL LOW (ref 8.9–10.3)
Chloride: 94 mmol/L — ABNORMAL LOW (ref 98–111)
Creatinine, Ser: 1.07 mg/dL — ABNORMAL HIGH (ref 0.44–1.00)
GFR, Estimated: 50 mL/min — ABNORMAL LOW (ref >60)
Glucose, Bld: 221 mg/dL — ABNORMAL HIGH (ref 70–99)
Potassium: 5.6 mmol/L — ABNORMAL HIGH (ref 3.5–5.1)
Sodium: 127 mmol/L — ABNORMAL LOW (ref 135–145)

## 2022-07-15 MED ORDER — HYDROMORPHONE HCL 1 MG/ML IJ SOLN
0.5000 mg | INTRAMUSCULAR | Status: DC | PRN
  Administered 2022-07-16: 0.5 mg via INTRAVENOUS
  Filled 2022-07-15: qty 0.5

## 2022-07-15 MED ORDER — NALOXONE HCL 0.4 MG/ML IJ SOLN: 0.4000 mg | INTRAMUSCULAR | Status: DC | PRN

## 2022-07-15 MED ORDER — HYDROMORPHONE HCL 1 MG/ML IJ SOLN
0.5000 mg | Freq: Once | INTRAMUSCULAR | Status: AC
  Administered 2022-07-15: 0.5 mg via INTRAVENOUS
  Filled 2022-07-15: qty 0.5

## 2022-07-15 MED ORDER — HYDROXYZINE HCL 10 MG PO TABS
10.0000 mg | ORAL_TABLET | Freq: Once | ORAL | Status: AC | PRN
  Administered 2022-07-15: 10 mg via ORAL
  Filled 2022-07-15: qty 1

## 2022-07-15 MED ORDER — SODIUM ZIRCONIUM CYCLOSILICATE 10 G PO PACK
10.0000 g | PACK | Freq: Once | ORAL | Status: AC
  Administered 2022-07-15: 10 g via ORAL
  Filled 2022-07-15: qty 1

## 2022-07-15 MED ORDER — OXYCODONE-ACETAMINOPHEN 5-325 MG PO TABS
2.0000 | ORAL_TABLET | ORAL | Status: DC | PRN
  Administered 2022-07-15 – 2022-07-19 (×11): 2 via ORAL
  Filled 2022-07-15 (×11): qty 2

## 2022-07-15 NOTE — Progress Notes (Addendum)
[facility] night cross cover note:   I was notified by RN of the patient's request for medication for anxiety.  Patient also requests a sleep aid, noting existing order for prn melatonin has been ineffective with the latter.   In this 89 year old female who appears to experience some delirium during this hospitalization, will refrain from aggressive central acting medications to address the above, including refraining from benzodiazepines.  Placed order for a single prn dose of hydroxyzine 10 mg p.o for anxiety.     Update: Patient also requesting authorization for pain control regimen.  I subsequently placed order for Dilaudid 0.5 mg IV x 1 and adjusted her existing prn IV Dilaudid from 0.5 mg IV every 4 hours to 0.5 mg IV every 3 hours prn.    Babs Bertin, DO Hospitalist

## 2022-07-15 NOTE — Progress Notes (Signed)
PROGRESS NOTE    [name]  [id number] DOB: Apr 09, 1931 DOA: 07/25/2022 PCP: [name], DO    Brief Narrative:  89 year old female with past medical history of chronic hypoxemic respiratory failure on 4 L of oxygen, chronic diastolic heart failure, diabetes mellitus type 2, DVT on Xarelto admitted to [facility] on 07/04/2022 for right-sided chest discomfort, found to be sepsis due to pneumonia, started on Rocephin and Zithromax. On 07/15/2022 blood culture positive for MRSA, imaging showed epidural abscess with cord compression, disc edema. Neurosurgery was consulted, patient was transferred to [facility] for incision and drainage of T9, T10 and T11, surgical intervention with laminectomy.  Remains in the hospital.  Remains on IV antibiotics.  Pain control is major issue.   Assessment & Plan:  Sepsis due to MRSA bacteremia -Extensive abscess of thoracic lumbar spine, T9, T10, T11 -S/p laminectomy and I&D  -Blood cultures grew MRSA on 07/14/2022 -Repeat blood cultures on 07/06/2022 remain negative to date -ID consulted, TTE showed no evidence of vegetation.  No Wall motion normality, mild aortic stenosis. -ID recommends to hold off on TEE for now as repeat blood cultures are negative -Central line right IJ removed on 07/07/2022 -Vancomycin switched to daptomycin per ID   -As per ID recommendations Continue daptomycin, ID clinic follow-up before changing any plans.  Plans for total 8 weeks, however they will see in the ID clinic and transition to oral linezolid after first 4 weeks. Snf orders with daptomycin Weekly cbc, cmp, crp and cpk Fax weekly labs to [phone]   Delirium -Likely medication induced,  -she was started on Haldol and melatonin -Continue delirium precautions -Pain control.   Chronic respiratory failure with hypoxemia and hypercarbia -COPD, not in exacerbation -Patient is currently on 2 L of oxygen by nasal cannula -Wean off oxygen as tolerated    Acute on chronic diastolic heart failure -Patient is requiring 2 to 3 L of oxygen via nasal cannula -She had bibasilar crackles on auscultation -Given a dose of Lasix.  Responded today. -Follow strict intake and output   Hypertension -Hold HCTZ, continue amlodipine, Imdur.  Stable.   Hypervolemic hyponatremia -Sodium has improved to 127 this morning -serum osmolality: 271 -Continue fluid restriction to 1.2 L/day  -Follow urine osmolality   History of DVT -Patient is on Xarelto at home that was restarted.      Diabetes mellitus type 2 -Hemoglobin A1c greater than 10.0 in January of this year -CBG well-controlled -Continue very sensitive sliding scale with NovoLog -Started on Semglee 5 units subcu daily, avoid hypoglycemia.   Hyperkalemia -Persistent.  Low potassium diet.  1 dose of Lokelma today.   DVT prophylaxis: SCD's Start: 07/18/2022 1933 SCDs Start: 07/04/22 0215 rivaroxaban (XARELTO) tablet 20 mg   Code Status: Full code Family Communication: Daughter on the phone Disposition Plan: Status is: Inpatient Remains inpatient appropriate because: IV antibiotics, difficult to control pain     Consultants:  ID Neurosurgery  Procedures:  I&D as above  Antimicrobials:  Daptomycin 3/15---   Subjective: Patient seen in the morning rounds.  She has been complaining of being thirsty.  She complains of pain and then goes back to sleep after taking medications. Discussed in detail with patient's daughter.  We will use oral pain medications as much possible and also keep some IV Dilaudid for severe pain.  Objective: Vitals:   07/14/22 2317 07/15/22 0413 07/15/22 0754 07/15/22 1205  BP: (!) 106/45 119/67 (!) 110/59 (!) 113/52  Pulse: 76 70  Resp: 18 18 20    Temp: 98.6 F (37 C) 98.6 F (37 C) 98.3 F (36.8 C) 98 F (36.7 C)  TempSrc:   Oral Oral  SpO2: 100%     Weight:      Height:        Intake/Output Summary (Last 24 hours) at 07/15/2022 1351 Last data  filed at 07/15/2022 1030 Gross per 24 hour  Intake 1340 ml  Output 550 ml  Net 790 ml   Filed Weights   07/08/22 0500 07/09/22 0459 07/14/22 0500  Weight: 87.4 kg 92.2 kg 98 kg    Examination:  General exam: Appears anxious.  Frail and debilitated.  Chronically sick looking. Respiratory system: Clear to auscultation.  Poor respiratory effort.  On 3 L oxygen. Cardiovascular system: S1 & S2 heard, RRR.  Bilateral chronic lymphedema. Gastrointestinal system: Distended.  Nontender.  Bowel sounds present. Central nervous system: Alert and awake.  Flat affect.  Oriented to self. Extremities: Symmetric 5 x 5 power.  Profound generalized weakness.     Data Reviewed: I have personally reviewed following labs and imaging studies  CBC: Recent Labs  Lab 07/10/22 0428  WBC 13.6*  HGB 8.3*  HCT 25.8*  MCV 87.8  PLT [id number]   Basic Metabolic Panel: Recent Labs  Lab 07/12/22 0427 07/13/22 0633 07/14/22 0247 07/14/22 1613 07/15/22 0303  NA 125* 124* 127* 126* 127*  K 5.4* 5.2* 5.0 5.2* 5.6*  CL 91* 90* 93* 91* 94*  CO2 27 29 27 25 27   GLUCOSE 191* 204* 77 262* 221*  BUN 15 20 25* 29* 29*  CREATININE 0.96 1.01* 1.03* 1.11* 1.07*  CALCIUM 8.2* 7.9* 7.9* 8.0* 8.0*   GFR: Estimated Creatinine Clearance: 40.5 mL/min (A) (by C-G formula based on SCr of 1.07 mg/dL (H)). Liver Function Tests: No results for input(s): "AST", "ALT", "ALKPHOS", "BILITOT", "PROT", "ALBUMIN" in the last 168 hours. No results for input(s): "LIPASE", "AMYLASE" in the last 168 hours. No results for input(s): "AMMONIA" in the last 168 hours. Coagulation Profile: No results for input(s): "INR", "PROTIME" in the last 168 hours. Cardiac Enzymes: Recent Labs  Lab 07/09/22 0625  CKTOTAL 24*   BNP (last 3 results) No results for input(s): "PROBNP" in the last 8760 hours. HbA1C: No results for input(s): "HGBA1C" in the last 72 hours. CBG: Recent Labs  Lab 07/14/22 2109 07/14/22 2316 07/15/22 0611  07/15/22 0846 07/15/22 1201  GLUCAP 220* 218* 173* 172* 162*   Lipid Profile: No results for input(s): "CHOL", "HDL", "LDLCALC", "TRIG", "CHOLHDL", "LDLDIRECT" in the last 72 hours. Thyroid Function Tests: No results for input(s): "TSH", "T4TOTAL", "FREET4", "T3FREE", "THYROIDAB" in the last 72 hours. Anemia Panel: No results for input(s): "VITAMINB12", "FOLATE", "FERRITIN", "TIBC", "IRON", "RETICCTPCT" in the last 72 hours. Sepsis Labs: No results for input(s): "PROCALCITON", "LATICACIDVEN" in the last 168 hours.  Recent Results (from the past 240 hour(s))  Surgical pcr screen     Status: Abnormal   Collection Time: 07/04/2022  2:15 PM   Specimen: Nasal Mucosa; Nasal Swab  Result Value Ref Range Status   MRSA, PCR POSITIVE (A) NEGATIVE Final    Comment: RESULT CALLED TO, READ BACK BY AND VERIFIED WITH:  Toney Sang, RN 07/01/2022 1803 A. LAFRANCE    Staphylococcus aureus POSITIVE (A) NEGATIVE Final    Comment: (NOTE) The Xpert SA Assay (FDA approved for NASAL specimens in patients 17 years of age and older), is one component of a comprehensive surveillance program. It is not intended to diagnose  infection nor to guide or monitor treatment. Performed at [facility], [employer] [address], [place], [place] [address]   Aerobic Culture w Gram Stain (superficial specimen)     Status: None   Collection Time: 07/02/2022  5:12 PM   Specimen: Abscess  Result Value Ref Range Status   Specimen Description WOUND  Final   Special Requests  THORACIC EPIDURAL ABCESS  Final   Gram Stain   Final    RARE WBC PRESENT, PREDOMINANTLY PMN FEW GRAM POSITIVE COCCI Performed at [facility], [place] [address] [place], [place] [address]    Culture   Final    ABUNDANT METHICILLIN RESISTANT STAPHYLOCOCCUS AUREUS   Report Status 07/08/2022 FINAL  Final   Organism ID, Bacteria METHICILLIN RESISTANT STAPHYLOCOCCUS AUREUS  Final      Susceptibility   Methicillin resistant staphylococcus aureus -  MIC*    CIPROFLOXACIN >=8 RESISTANT Resistant     ERYTHROMYCIN >=8 RESISTANT Resistant     GENTAMICIN <=0.5 SENSITIVE Sensitive     OXACILLIN >=4 RESISTANT Resistant     TETRACYCLINE 2 SENSITIVE Sensitive     VANCOMYCIN 1 SENSITIVE Sensitive     TRIMETH/SULFA <=10 SENSITIVE Sensitive     CLINDAMYCIN >=8 RESISTANT Resistant     RIFAMPIN <=0.5 SENSITIVE Sensitive     Inducible Clindamycin NEGATIVE Sensitive     * ABUNDANT METHICILLIN RESISTANT STAPHYLOCOCCUS AUREUS  Culture, blood (Routine X 2) w Reflex to ID Panel     Status: None   Collection Time: 07/06/22  4:38 AM   Specimen: BLOOD  Result Value Ref Range Status   Specimen Description BLOOD BLOOD RIGHT HAND  Final   Special Requests   Final    BOTTLES DRAWN AEROBIC AND ANAEROBIC Blood Culture adequate volume   Culture   Final    NO GROWTH 5 DAYS Performed at [facility], [address], [place], [place] [address]    Report Status 07/11/2022 FINAL  Final  Culture, blood (Routine X 2) w Reflex to ID Panel     Status: None   Collection Time: 07/06/22  4:38 AM   Specimen: BLOOD  Result Value Ref Range Status   Specimen Description BLOOD BLOOD RIGHT HAND  Final   Special Requests   Final    BOTTLES DRAWN AEROBIC AND ANAEROBIC Blood Culture adequate volume   Culture   Final    NO GROWTH 5 DAYS Performed at [facility], [place] [address], [place], [place] [address]    Report Status 07/11/2022 FINAL  Final         Radiology Studies: No results found.      Scheduled Meds:  amLODipine  10 mg Oral Daily   brimonidine  1 drop Both Eyes Daily   brinzolamide  1 drop Both Eyes Daily   Chlorhexidine Gluconate Cloth  6 each Topical Daily   Gerhardt's butt cream   Topical Daily   insulin aspart  0-6 Units Subcutaneous TID WC   insulin glargine-yfgn  5 Units Subcutaneous Daily   isosorbide mononitrate  60 mg Oral Daily   latanoprost  1 drop Both Eyes QHS   metoprolol tartrate  12.5 mg Oral BID   pantoprazole  40  mg Oral Daily   polyethylene glycol  17 g Oral Daily   rivaroxaban  20 mg Oral Q supper   senna-docusate  1 tablet Oral BID   sodium chloride flush  3 mL Intravenous Q12H   sodium chloride  2 g  Oral BID WC   umeclidinium-vilanterol  1 puff Inhalation Daily   Continuous Infusions:  sodium chloride     DAPTOmycin (CUBICIN) 600 mg in sodium chloride 0.9 % IVPB 124 mL/hr at 07/15/22 0521     LOS: 11 days    Time spent: 35 minutes     Barb Merino, MD [facility] Pager [phone]

## 2022-07-15 NOTE — Progress Notes (Signed)
Despite all the pain medications and anxiety medications given, patient still showing the same behavior. She still requesting pain medications constantly, still awake and anxious. Pain management education was given to the patient who seems to not understand. Provider was notified again. Will continue to monitor.

## 2022-07-15 NOTE — Progress Notes (Signed)
Physical Therapy Treatment Patient Details Name: [name] MRN: [id number] DOB: 04/07/1931 Today's Date: 07/15/2022   History of Present Illness 89 yo female presenting 3/8 with abdominal pain. Work up revealed CAP. Pt transferred to [facility] on 3/10 after MRI showed "extensive epidural abscess extending from at least C7 / T1 caudally through the L1-2 with multifocal mod / severe compression of the spinal cord at T8-9, T10-11, T11-12" And is now s/p T9-11 laminectomy with evacuation of epidural abscess on 3/10. PMH includes: cellulitis of Left leg, CHF, COPD, DM II, glaucoma, HLD, and HTN.    PT Comments    Pt demonstrated good initiation with bed mobility and her first transfer to stand from EOB and taking pivot steps to the recliner, needing only up to [place]. However, once she was sitting in the recliner she began to have difficulty initiating transfers, needing maxA and x2 attempts to stand for pericare secondary to bowel incontinence. In addition, her SpO2 levels would decrease to <90% on 4-6L O2 with transfers and when she took doffed her [place] to blow her nose in sitting her SpO2 level dropped to as low as 67%, needing 10L O2 to rebound to the 90s%. Her SpO2 levels remained in the 90s% on 4L reclined and resting end of session though. Secondary to her SpO2 dropping more frequently today, decided to defer further standing mobility attempts and focus remainder of session on lower extremity exercises to promote strength. Will continue to follow acutely. Current recommendations remain appropriate.    Recommendations for follow up therapy are one component of a multi-disciplinary discharge planning process, led by the attending physician.  Recommendations may be updated based on patient status, additional functional criteria and insurance authorization.  Follow Up Recommendations  Skilled nursing-short term rehab (<3 hours/day) Can patient physically be transported by private vehicle: No    Assistance Recommended at Discharge Frequent or constant Supervision/Assistance  Patient can return home with the following Two people to help with walking and/or transfers;Two people to help with bathing/dressing/bathroom;Direct supervision/assist for medications management;Direct supervision/assist for financial management;Assist for transportation;Help with stairs or ramp for entrance;Assistance with feeding;Assistance with cooking/housework   Equipment Recommendations  Other (comment) (defer to post acute)    Recommendations for Other Services       Precautions / Restrictions Precautions Precautions: Fall;Back Precaution Booklet Issued: Yes (comment) Precaution Comments: needs cues to maintain precautions Required Braces or Orthoses:  (no brace needed orders) Restrictions Weight Bearing Restrictions: No     Mobility  Bed Mobility Overal bed mobility: Needs Assistance Bed Mobility: Rolling, Sidelying to Sit Rolling: Min assist Sidelying to sit: Mod assist, HOB elevated       General bed mobility comments: Verbal cues provided to flex R leg and reach R UE to L bed rail to log roll, minA to complete rotation of trunk. ModA to ascend trunk with pt bringing legs off EOB as cued.    Transfers Overall transfer level: Needs assistance Equipment used: Rolling walker (2 wheels) Transfers: Sit to/from Stand, Bed to chair/wheelchair/BSC Sit to Stand: Mod assist, Max assist   Step pivot transfers: Mod assist       General transfer comment: Pt demonstrated improved initiation of her lower extremity muscles to transfer to stand the first rep from EOB, modA. However, when attempting to stand from recliner she began to lean posteriorly with poor lower extremity initiation to stand, needing x2 attempts before being successul with maxA. Cues provided for hand placement. [place] for stability and RW management to  transfer to L from EOB to recliner via step pivot transfer.     Ambulation/Gait Ambulation/Gait assistance: Mod assist Gait Distance (Feet): 2 Feet Assistive device: Rolling walker (2 wheels) Gait Pattern/deviations: Step-to pattern, Decreased step length - right, Decreased step length - left, Decreased stride length, Decreased dorsiflexion - right, Decreased dorsiflexion - left, Decreased weight shift to right, Decreased weight shift to left, Leaning posteriorly, Trunk flexed Gait velocity: reduced Gait velocity interpretation: <1.31 ft/sec, indicative of household ambulator   General Gait Details: Pt only taking pivot steps to L from bed to recliner today, needing modA to shift weight anteriorly, balance, and manage RW.   Stairs             Wheelchair Mobility    Modified Rankin (Stroke Patients Only)       Balance Overall balance assessment: Needs assistance Sitting-balance support: Feet supported, Bilateral upper extremity supported Sitting balance-Leahy Scale: Poor Sitting balance - Comments: Min guard assist to sit EOB with UE support Postural control: Posterior lean Standing balance support: Bilateral upper extremity supported, During functional activity Standing balance-Leahy Scale: Poor Standing balance comment: ModA with bil UE support                            Cognition Arousal/Alertness: Awake/alert Behavior During Therapy: WFL for tasks assessed/performed Overall Cognitive Status: Impaired/Different from baseline Area of Impairment: Attention, Memory, Following commands, Safety/judgement, Awareness, Problem solving                   Current Attention Level: Sustained Memory: Decreased short-term memory, Decreased recall of precautions Following Commands: Follows one step commands consistently, Follows one step commands with increased time Safety/Judgement: Decreased awareness of safety, Decreased awareness of deficits Awareness: Intellectual Problem Solving: Slow processing, Decreased  initiation, Difficulty sequencing, Requires verbal cues General Comments: HOH, needing extra time to process and repetition of cues. Needs cues to maintain back precautions. Poor awareness of deficits.        Exercises General Exercises - Lower Extremity Long Arc Quad: AROM, Strengthening, Both, Other reps (comment), Seated (>30x each) Hip ABduction/ADduction: AROM, Strengthening, Both, 10 reps, Seated Hip Flexion/Marching: AROM, Strengthening, Both, 10 reps, Seated    General Comments General comments (skin integrity, edema, etc.): While sitting in chair, ptt taking [place] off to blow nose and SpO2 declined to as low as 67%, donned [place] and bumped up to 10L with gradual rebound to 90s%; SpO2 in 90s% on 4L end of session, resting in recliner      Pertinent Vitals/Pain Pain Assessment Pain Assessment: Faces Faces Pain Scale: Hurts even more Pain Location: generalized with movement Pain Descriptors / Indicators: Discomfort, Grimacing, Moaning Pain Intervention(s): Monitored during session, Limited activity within patient's tolerance, Repositioned, Patient requesting pain meds-RN notified    Home Living                          Prior Function            PT Goals (current goals can now be found in the care plan section) Acute Rehab PT Goals Patient Stated Goal: to improve PT Goal Formulation: With patient Time For Goal Achievement: 07/20/22 Potential to Achieve Goals: Fair Progress towards PT goals: Progressing toward goals    Frequency    Min 3X/week      PT Plan Current plan remains appropriate    Co-evaluation  AM-PAC PT "6 Clicks" Mobility   Outcome Measure  Help needed turning from your back to your side while in a flat bed without using bedrails?: A Little Help needed moving from lying on your back to sitting on the side of a flat bed without using bedrails?: A Lot Help needed moving to and from a bed to a chair (including a wheelchair)?:  A Lot Help needed standing up from a chair using your arms (e.g., wheelchair or bedside chair)?: A Lot Help needed to walk in hospital room?: Total Help needed climbing 3-5 steps with a railing? : Total 6 Click Score: 11    End of Session Equipment Utilized During Treatment: Oxygen;Gait belt Activity Tolerance: Patient limited by pain Patient left: with call bell/phone within reach;in chair;with chair alarm set Nurse Communication: Mobility status;Patient requests pain meds;Other (comment) (SpO2) PT Visit Diagnosis: Other abnormalities of gait and mobility (R26.89);Muscle weakness (generalized) (M62.81);Unsteadiness on feet (R26.81);Difficulty in walking, not elsewhere classified (R26.2)     Time: [id number] PT Time Calculation (min) (ACUTE ONLY): 31 min  Charges:  $Therapeutic Exercise: 8-22 mins $Therapeutic Activity: 8-22 mins                     Moishe Spice, PT, DPT Acute Rehabilitation Services  Office: [phone]    Orvan Falconer 07/15/2022, 4:24 PM

## 2022-07-15 NOTE — Plan of Care (Signed)
  Problem: Coping: Goal: Ability to adjust to condition or change in health will improve Outcome: Progressing   Problem: Fluid Volume: Goal: Ability to maintain a balanced intake and output will improve Outcome: Progressing   Problem: Metabolic: Goal: Ability to maintain appropriate glucose levels will improve Outcome: Progressing   Problem: Education: Goal: Knowledge of General Education information will improve Description: Including pain rating scale, medication(s)/side effects and non-pharmacologic comfort measures 07/15/2022 1955 by Charlestine Night, RN Outcome: Progressing 07/15/2022 1554 by Charlestine Night, RN Outcome: Progressing   Problem: Elimination: Goal: Will not experience complications related to bowel motility Outcome: Progressing

## 2022-07-15 NOTE — Progress Notes (Signed)
Patient anxious, requesting to get pain medications constantly every 30 minutes to 1 hour. Dr. Velia Meyer needed to be contacted to let him know about the situation. PRN Atarax, single dose of Dilaudid 0.5 mg was ordered and administered to the patient along with scheduled meds and PRN Robaxin. Will continue to monitor.

## 2022-07-15 NOTE — Plan of Care (Signed)
  Problem: Education: Goal: Ability to describe self-care measures that may prevent or decrease complications (Diabetes Survival Skills Education) will improve Outcome: Not Progressing Goal: Individualized Educational Video(s) Outcome: Not Progressing   Problem: Coping: Goal: Ability to adjust to condition or change in health will improve Outcome: Not Progressing   Problem: Fluid Volume: Goal: Ability to maintain a balanced intake and output will improve Outcome: Not Progressing   Problem: Health Behavior/Discharge Planning: Goal: Ability to identify and utilize available resources and services will improve Outcome: Not Progressing Goal: Ability to manage health-related needs will improve Outcome: Not Progressing   Problem: Metabolic: Goal: Ability to maintain appropriate glucose levels will improve Outcome: Not Progressing   Problem: Nutritional: Goal: Maintenance of adequate nutrition will improve Outcome: Not Progressing Goal: Progress toward achieving an optimal weight will improve Outcome: Not Progressing   Problem: Skin Integrity: Goal: Risk for impaired skin integrity will decrease Outcome: Not Progressing   Problem: Tissue Perfusion: Goal: Adequacy of tissue perfusion will improve Outcome: Not Progressing   Problem: Education: Goal: Knowledge of General Education information will improve Description: Including pain rating scale, medication(s)/side effects and non-pharmacologic comfort measures Outcome: Not Progressing   Problem: Health Behavior/Discharge Planning: Goal: Ability to manage health-related needs will improve Outcome: Not Progressing   Problem: Clinical Measurements: Goal: Ability to maintain clinical measurements within normal limits will improve Outcome: Not Progressing Goal: Will remain free from infection Outcome: Not Progressing Goal: Diagnostic test results will improve Outcome: Not Progressing Goal: Respiratory complications will  improve Outcome: Not Progressing Goal: Cardiovascular complication will be avoided Outcome: Not Progressing   Problem: Activity: Goal: Risk for activity intolerance will decrease Outcome: Not Progressing   Problem: Nutrition: Goal: Adequate nutrition will be maintained Outcome: Not Progressing   Problem: Coping: Goal: Level of anxiety will decrease Outcome: Not Progressing   Problem: Elimination: Goal: Will not experience complications related to bowel motility Outcome: Not Progressing Goal: Will not experience complications related to urinary retention Outcome: Not Progressing   Problem: Pain Managment: Goal: General experience of comfort will improve Outcome: Not Progressing   Problem: Safety: Goal: Ability to remain free from injury will improve Outcome: Not Progressing   Problem: Skin Integrity: Goal: Risk for impaired skin integrity will decrease Outcome: Not Progressing   Problem: Education: Goal: Ability to verbalize activity precautions or restrictions will improve Outcome: Not Progressing Goal: Knowledge of the prescribed therapeutic regimen will improve Outcome: Not Progressing Goal: Understanding of discharge needs will improve Outcome: Not Progressing   Problem: Activity: Goal: Ability to avoid complications of mobility impairment will improve Outcome: Not Progressing Goal: Ability to tolerate increased activity will improve Outcome: Not Progressing Goal: Will remain free from falls Outcome: Not Progressing   Problem: Bowel/Gastric: Goal: Gastrointestinal status for postoperative course will improve Outcome: Not Progressing   Problem: Clinical Measurements: Goal: Ability to maintain clinical measurements within normal limits will improve Outcome: Not Progressing Goal: Postoperative complications will be avoided or minimized Outcome: Not Progressing Goal: Diagnostic test results will improve Outcome: Not Progressing   Problem: Pain  Management: Goal: Pain level will decrease Outcome: Not Progressing   Problem: Skin Integrity: Goal: Will show signs of wound healing Outcome: Not Progressing   Problem: Health Behavior/Discharge Planning: Goal: Identification of resources available to assist in meeting health care needs will improve Outcome: Not Progressing   Problem: Bladder/Genitourinary: Goal: Urinary functional status for postoperative course will improve Outcome: Not Progressing

## 2022-07-15 NOTE — Progress Notes (Signed)
Patient with multiple and frequent requests through the night. Asking for pain medication every hour. Patient was educated about the frequency that she can obtain PRN pain meds, verbalized understanding. Despite this, she demonstrates to be forgetful and still asking for pain medications and other request frequently. Will continue to monitor.

## 2022-07-16 ENCOUNTER — Other Ambulatory Visit: Payer: Self-pay

## 2022-07-16 DIAGNOSIS — R7881 Bacteremia: Secondary | ICD-10-CM | POA: Diagnosis not present

## 2022-07-16 DIAGNOSIS — G062 Extradural and subdural abscess, unspecified: Secondary | ICD-10-CM | POA: Diagnosis not present

## 2022-07-16 DIAGNOSIS — Z515 Encounter for palliative care: Secondary | ICD-10-CM

## 2022-07-16 DIAGNOSIS — I5032 Chronic diastolic (congestive) heart failure: Secondary | ICD-10-CM | POA: Diagnosis not present

## 2022-07-16 DIAGNOSIS — J189 Pneumonia, unspecified organism: Secondary | ICD-10-CM | POA: Diagnosis not present

## 2022-07-16 DIAGNOSIS — Z7189 Other specified counseling: Secondary | ICD-10-CM

## 2022-07-16 DIAGNOSIS — B9562 Methicillin resistant Staphylococcus aureus infection as the cause of diseases classified elsewhere: Secondary | ICD-10-CM | POA: Diagnosis not present

## 2022-07-16 DIAGNOSIS — M4620 Osteomyelitis of vertebra, site unspecified: Secondary | ICD-10-CM | POA: Diagnosis not present

## 2022-07-16 DIAGNOSIS — G061 Intraspinal abscess and granuloma: Secondary | ICD-10-CM | POA: Diagnosis not present

## 2022-07-16 LAB — CULTURE, BLOOD (ROUTINE X 2)

## 2022-07-16 LAB — GLUCOSE, CAPILLARY
Glucose-Capillary: 179 mg/dL — ABNORMAL HIGH (ref 70–99)
Glucose-Capillary: 170 mg/dL — ABNORMAL HIGH (ref 70–99)
Glucose-Capillary: 81 mg/dL (ref 70–99)
Glucose-Capillary: 138 mg/dL — ABNORMAL HIGH (ref 70–99)

## 2022-07-16 LAB — C-REACTIVE PROTEIN: CRP: 8.3 mg/dL — ABNORMAL HIGH (ref <1.0)

## 2022-07-16 LAB — BASIC METABOLIC PANEL
Anion gap: 7 (ref 5–15)
BUN: 33 mg/dL — ABNORMAL HIGH (ref 8–23)
CO2: 27 mmol/L (ref 22–32)
Calcium: 8.3 mg/dL — ABNORMAL LOW (ref 8.9–10.3)
Chloride: 92 mmol/L — ABNORMAL LOW (ref 98–111)
Creatinine, Ser: 1.08 mg/dL — ABNORMAL HIGH (ref 0.44–1.00)
GFR, Estimated: 49 mL/min — ABNORMAL LOW (ref >60)
Glucose, Bld: 196 mg/dL — ABNORMAL HIGH (ref 70–99)
Potassium: 5.5 mmol/L — ABNORMAL HIGH (ref 3.5–5.1)
Sodium: 126 mmol/L — ABNORMAL LOW (ref 135–145)

## 2022-07-16 LAB — CK: Total CK: 445 U/L — ABNORMAL HIGH (ref 38–234)

## 2022-07-16 MED ORDER — FUROSEMIDE 40 MG PO TABS
40.0000 mg | ORAL_TABLET | Freq: Every day | ORAL | Status: DC
  Administered 2022-07-16 – 2022-07-18 (×3): 40 mg via ORAL
  Filled 2022-07-16 (×3): qty 1

## 2022-07-16 MED ORDER — SODIUM POLYSTYRENE SULFONATE 15 GM/60ML PO SUSP
30.0000 g | Freq: Once | ORAL | Status: AC
  Administered 2022-07-16: 30 g via RECTAL
  Filled 2022-07-16: qty 120

## 2022-07-16 NOTE — TOC Progression Note (Signed)
Transition of Care [facility]) - Progression Note    Patient Details  Name: [name] MRN: [id number] Date of Birth: 11/21/1930  Transition of Care [facility]) CM/SW [place], [place] Phone Number: 07/16/2022, 10:27 AM  Clinical Narrative:   Patient pending picc line placement, hopeful for placement today and then SNF tomorrow. CSW checked [facility] with [facility] still able to accept patient when medically stable. CSW spoke with daughter, Victorino Sparrow, to update. CSW to follow.    Expected Discharge Plan: [place] Barriers to Discharge: Continued Medical Work up  Expected Discharge Plan and Services In-house Referral: Clinical Social Work     Living arrangements for the past 2 months: Single Family Home                                       Social Determinants of Health (SDOH) Interventions SDOH Screenings   Food Insecurity: No Food Insecurity (07/04/2022)  Housing: Low Risk  (07/04/2022)  Transportation Needs: No Transportation Needs (07/04/2022)  Utilities: Not At Risk (07/04/2022)  Tobacco Use: Low Risk  (06/27/2022)    Readmission Risk Interventions    07/06/2022    4:11 PM 05/07/2022    3:17 PM  Readmission Risk Prevention Plan  Transportation Screening Complete Complete  PCP or Specialist Appt within 3-5 Days  Complete  HRI or [place]  Complete  Social Work Consult for [place] Planning/Counseling  Complete  Palliative Care Screening  Not Applicable  Medication Review Press photographer) Complete Complete  PCP or Specialist appointment within 3-5 days of discharge Complete   HRI or [place] Complete   SW Recovery Care/Counseling Consult Complete   Palliative Care Screening Not Applicable   Skilled Nursing Facility Complete

## 2022-07-16 NOTE — Progress Notes (Addendum)
Mobility Specialist: Progress Note   07/16/22 1159  Mobility  Activity Transferred from bed to chair  Level of Assistance Maximum assist, patient does 25-49%  Assistive Device Front wheel walker  Distance Ambulated (ft) 2 ft  Activity Response Tolerated fair  Mobility Referral Yes  $Mobility charge 1 Mobility   Pre-Mobility: 63 HR, 1112/60 (72) BP, 97% SpO2 Post-Mobility: 79 HR, 99/84 (90) BP, 98% SpO2  Pt received in the bed and agreeable to mobility. Session completed on 5 L/min [place]. MaxA to sit EOB from supine and modA to stand and pivot to the chair. Verbal cues for hand placement as well as physical assist for weight shifting and RW management. C/o back pain during session, no rating given. Pt is in the chair with call bell at her side. Chair alarm is on.   [place] [name] Mobility Specialist Please contact via SecureChat or Rehab office at [phone]

## 2022-07-16 NOTE — Consult Note (Signed)
Palliative Care Consult Note                                  Date: 07/16/2022   Patient Name: [name]  DOB: Jul 02, 1930  MRN: [id number]  Age / Sex: 89 y.o., female  PCP: [name], DO Referring Physician: Barb Merino, MD  Reason for Consultation: Establishing goals of care  HPI/Patient Profile: 89 y.o. female  with past medical history of COPD and chronic hypoxemic respiratory failure on 4L of oxygen, chronic diastolic heart failure, diabetes mellitus type 2, and DVT on Xarelto who presented to [facility] ED on 06/26/2022 with right-sided chest discomfort.  She was admitted with sepsis secondary to pneumonia.  On 07/25/2022 blood cultures positive for MRSA and imaging showed epidural abscess with cord compression.  Patient was transferred to [facility] for incision and drainage of T9, T10, and T11 with laminectomy.  Past Medical History:  Diagnosis Date   Cellulitis and abscess of left leg    CHF (congestive heart failure) (HCC)    COPD (chronic obstructive pulmonary disease) (HCC)    Diabetes mellitus    Glaucoma    Hyperlipidemia    Hypertension     Subjective:   I have reviewed medical records including progress notes, labs and imaging, and assessed the patient at bedside.  She is out of bed to the recliner.  She reports back pain that is "pounding" in nature.  I met with daughter/Scottie in the 3W conference room to discuss diagnosis, prognosis, GOC, EOL wishes, disposition, and options.  I introduced Palliative Medicine as specialized medical care for people living with serious illness. It focuses on providing relief from the symptoms and stress of a serious illness.   We discussed patient's current medical situation and what it means in the larger context of her ongoing co-morbidities. Current clinical status was reviewed.   Created space and opportunity for patient and family to explore thoughts and feelings regarding current  medical situation. Values and goals of care were attempted to be elicited.  A discussion was had today regarding advanced directives. Concepts specific to code status, artifical feeding and hydration, continued IV antibiotics and rehospitalization was had.  The MOST form was introduced and discussed.   Outpatient palliative care and hospice services were explained and offered. Questions and concerns were addressed. "Hard choices" book provided.     Life Review: Yisell has 5 children from her first husband. She has been married to her second husband Richardson Landry for 27 years. Scottie lives with Everlene Farrier and Richardson Landry at their home. She reports that Richardson Landry is not very helpful and that he sleeps all day and stays up all night.   Functional Status: Prior to January, Rumi was ambulatory with a walker. She was able to get out in her yard and enjoyed gardening. Scottie describes her mother as a "pleasant, humorous, and pleasant" person.   Patient/Family Understanding of Illness: Scottie verbalizes understanding of the seriousness of her mother's current medical situation. Discussed patient's acute medical issues - MRSA bacteremia, extensive abscess of thoracic spine, and plan for 8 weeks of IV antibiotics. Also discussed patient's chronic medical issues - congestive heart failure, COPD, and diabetes. Natural disease trajectory of chronic illness was discussed, specifically that patients do not usually return to their previous baseline after a major illness.   Goals: Rehab to improve functional status with the ultimate goal of returning home.   Advanced Directives: Daughter  reports that patient does have HCPOA and living will documents. Requested a copy as they are not currently on file.   Additional Discussion: We discussed different paths of care - full scope versus limited interventions versus comfort care.  Encouraged consideration of DNR/DNI status understanding evidenced based poor outcomes in similar  hospitalized patients, as the cause of the arrest is likely associated with chronic/terminal disease rather than a reversible acute cardio-pulmonary event. Scottie agrees that DNR is appropriate and would be consistent with her mother's wishes.    Review of Systems  Musculoskeletal:  Positive for back pain.    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  Constitutional:      General: She is not in acute distress.    Appearance: She is obese. She is ill-appearing.  HENT:     Head:     Comments: Hard of hearing Pulmonary:     Effort: Pulmonary effort is normal.  Neurological:     Mental Status: She is alert.     Motor: Weakness present.    Vital Signs:  BP 101/84 (BP Location: Right Arm)   Pulse 67   Temp 97.8 F (36.6 C) (Axillary)   Resp 15   Ht 5' 4.02" (1.626 m)   Wt 100.1 kg   SpO2 94%   BMI 37.86 kg/m   Palliative Assessment/Data: PPS 40%     Assessment & Plan:   SUMMARY OF RECOMMENDATIONS   Code status changed to DNR/DNI Continue current interventions Goal of care - rehab to improve functional status with ultimate goal to return home Outpatient palliative referral  Primary Decision Maker: Patient's 5 children are in close communication and would work together to make major decisions Scottie is the designated point of contact   Symptom Management:  Oxycodone-acetaminophen 5-325 2 tablets every 4 hours as needed  Prognosis:  Unable to determine  Discharge Planning:  SNF/rehab   Thank you for allowing [place] to participate in the care of [place]   Signed by: Elie Confer, NP Palliative Medicine  Team  Team Phone # [phone]  For individual providers, please see AMION

## 2022-07-16 NOTE — Progress Notes (Signed)
Pharmacy Antibiotic Note  [name] is a 89 y.o. female admitted on 07/12/2022 with MRSA bacteremia and thoracolumbar abscess.  Pharmacy has been consulted for vancomycin dosing. SCr steady at < 1. Vancomycin transitioned to daptomycin. OPAT added through 08/07/22. CK up from 24 > 445. Per discussion with RN, no signs/symptoms of myopathy reported. Continue daptomycin and continue to monitor. Next CK 3/28 or prior to discharge.   Plan: Daptomycin 600mg  IV Q24h Trend WBC, fever, renal function, CK F/u cultures, clinical progress  Height: 5' 4.02" (162.6 cm) Weight: 100.1 kg (220 lb 10.9 oz) IBW/kg (Calculated) : 54.74  Temp (24hrs), Avg:97.6 F (36.4 C), Min:97 F (36.1 C), Max:98 F (36.7 C)  Recent Labs  Lab 07/10/22 0428 07/11/22 0501 07/13/22 0633 07/14/22 0247 07/14/22 1613 07/15/22 0303 07/16/22 0446  WBC 13.6*  --   --   --   --   --   --   CREATININE 0.89   < > 1.01* 1.03* 1.11* 1.07* 1.08*   < > = values in this interval not displayed.    Estimated Creatinine Clearance: 40.6 mL/min (A) (by C-G formula based on SCr of 1.08 mg/dL (H)).    Allergies  Allergen Reactions   Buspirone Shortness Of Breath and Other (See Comments)    "couldn't breathe"   Cephalexin Anaphylaxis, Swelling and Other (See Comments)   Micardis [Telmisartan] Other (See Comments)    Hyperkalemia during hospitalization with pulmonary edema   Atorvastatin Other (See Comments)    Myalgia    Canagliflozin Other (See Comments)    Pt does not recall reaction   Doxycycline Hyclate Nausea Only   Lipitor [Atorvastatin Calcium] Other (See Comments)    Myalgia    Tramadol Nausea Only    Thank you for allowing pharmacy to be a part of this patient's care.  Ardyth Harps, PharmD Clinical Pharmacist

## 2022-07-16 NOTE — Progress Notes (Signed)
PHARMACY CONSULT NOTE FOR:  OUTPATIENT  PARENTERAL ANTIBIOTIC THERAPY (OPAT)  Indication: MRSA bacteremia/epidural abscess Regimen: Daptomycin 600 mg every 24 hours  End date: 08/16/22  IV antibiotic discharge orders are pended. To discharging provider:  please sign these orders via discharge navigator,  Select New Orders & click on the button choice - Manage This Unsigned Work.     Thank you for allowing pharmacy to be a part of this patient's care.  Jimmy Footman, PharmD, BCPS, BCIDP Infectious Diseases Clinical Pharmacist Phone: [phone] 07/16/2022, 12:54 PM

## 2022-07-16 NOTE — Progress Notes (Signed)
[facility]  Date of Admission:  06/30/2022     Lines:  Peripheral iv's     Abx: 3/13-c dapto  3/10-13 vanc 3/10-11 ceftriaxone                                                        Assessment: 89 yo female  copd chronic 4 liters o2 at night, hx bilateral LE dvt on xarelto, dm2, htn, HFpEF, recent admission 2 weeks prior to admission for copd exacerbation, admitted 3/9 with sepsis found to have mrsa bacteremia and extensive thoracolumbar epidural abscess/om      She has no hardware/ppm present     She is s/p I&D (No hardware placed) by NSG on 3/10 -- cx mrsa 3/09 bcx mrsa (S tetra/bactrim) 3/11 repeat bcx ngtd   3/11 tte no obvious vegetation; moderate AV calcification. Plan to defer tee if bcx remains negative on repeat and no further sepsis   --------- 3/21 assessment Plan is at least 6 weeks with good serum drug concentration abx for mrsa, then at least 2 more weeks after to cover mrsa vertebral OM. Mri imaging repeat will be needed near end to evaluate if abx needs to be extended in setting extensive epidural abscess  Ok to place picc. Patient near discharge to rehab   Plan: Finish 6 weeks daptomycin starting 3/10 in snf, until 4/21 Will place picc order On 4/21 can transition to doxycycline 100 mg po bid until at least 5/06 Id clinic f/u with me on 4/12 @ 930 Mri cervical and thoracic spine around last week of 07/2022 will be needed Snf orders with daptomycin Weekly cbc, cmp, crp and cpk Fax weekly labs to [phone].   Discussed with primary team   Clinic Follow Up Appt: 4/12 @ 930  @  [facility] [place], [place], [place] [address] Phone: [phone]   I spent more than 50 minute reviewing data/chart, and coordinating care and >50% direct face to face time providing counseling/discussing diagnostics/treatment plan with patient   Principal Problem:   CAP (community acquired pneumonia) Active  Problems:   Chronic diastolic CHF (congestive heart failure) ([place])   Essential hypertension   DM2 (diabetes mellitus, type 2) (H[facility])   COPD (chronic obstructive pulmonary disease) ([place])   Acute hyponatremia   History of DVT (deep vein thrombosis)   Anemia of chronic disease   Epidural abscess   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   MRSA bacteremia   Vertebral osteomyelitis (HCC)   Allergies  Allergen Reactions   Buspirone Shortness Of Breath and Other (See Comments)    "couldn't breathe"   Cephalexin Anaphylaxis, Swelling and Other (See Comments)   Micardis [Telmisartan] Other (See Comments)    Hyperkalemia during hospitalization with pulmonary edema   Atorvastatin Other (See Comments)    Myalgia    Canagliflozin Other (See Comments)    Pt does not recall reaction   Doxycycline Hyclate Nausea Only   Lipitor [Atorvastatin Calcium] Other (See Comments)    Myalgia    Tramadol Nausea Only    Scheduled Meds:  amLODipine  10 mg Oral Daily   brimonidine  1 drop Both Eyes Daily   brinzolamide  1 drop Both Eyes Daily   Chlorhexidine Gluconate Cloth  6 each Topical Daily   furosemide  40 mg Oral Daily   Gerhardt's butt cream   Topical Daily   insulin aspart  0-6 Units Subcutaneous TID WC   insulin glargine-yfgn  5 Units Subcutaneous Daily   isosorbide mononitrate  60 mg Oral Daily   latanoprost  1 drop Both Eyes QHS   metoprolol tartrate  12.5 mg Oral BID   pantoprazole  40 mg Oral Daily   polyethylene glycol  17 g Oral Daily   rivaroxaban  20 mg Oral Q supper   senna-docusate  1 tablet Oral BID   sodium chloride flush  3 mL Intravenous Q12H   sodium chloride  2 g Oral BID WC   umeclidinium-vilanterol  1 puff Inhalation Daily   Continuous Infusions:  sodium chloride     DAPTOmycin (CUBICIN) 600 mg in sodium chloride 0.9 % IVPB 124 mL/hr at 07/16/22 0524   PRN Meds:.acetaminophen **OR** acetaminophen, benzonatate, hydrALAZINE, ipratropium-albuterol, melatonin,  menthol-cetylpyridinium **OR** phenol, methocarbamol, metoprolol tartrate, naLOXone (NARCAN)  injection, ondansetron **OR** ondansetron (ZOFRAN) IV, oxyCODONE-acetaminophen, sodium chloride flush   SUBJECTIVE: Nearing dispo to snf Tolerating daptomycin No f/c Back pain better   Review of Systems: ROS All other ROS was negative, except mentioned above     OBJECTIVE: Vitals:   07/16/22 0100 07/16/22 0455 07/16/22 0500 07/16/22 0803  BP: 110/66 101/84    Pulse: 71 67    Resp: 20 15    Temp: (!) 97.5 F (36.4 C) 97.8 F (36.6 C)    TempSrc: Axillary Axillary    SpO2: 96% 96%  94%  Weight:   100.1 kg   Height:       Body mass index is 37.86 kg/m.  Physical Exam General/constitutional: no distress, pleasant; sitting in chair HEENT: Normocephalic, PER, Conj Clear, EOMI, Oropharynx clear Neck supple CV: rrr no mrg Lungs: clear to auscultation, normal respiratory effort Abd: Soft, Nontender Ext trace bilateral upper ext edema, 1+ bilateral LE edema to knees Skin/msk; thoracic spine dressing clean/dry Neuro: nonfocal    Lab Results Lab Results  Component Value Date   WBC 13.6 (H) 07/10/2022   HGB 8.3 (L) 07/10/2022   HCT 25.8 (L) 07/10/2022   MCV 87.8 07/10/2022   PLT 154 07/10/2022    Lab Results  Component Value Date   CREATININE 1.08 (H) 07/16/2022   BUN 33 (H) 07/16/2022   NA 126 (L) 07/16/2022   K 5.5 (H) 07/16/2022   CL 92 (L) 07/16/2022   CO2 27 07/16/2022    Lab Results  Component Value Date   ALT 24 07/06/2022   AST 24 07/06/2022   ALKPHOS 144 (H) 07/06/2022   BILITOT 0.6 07/06/2022      Microbiology: No results found for this or any previous visit (from the past 240 hour(s)).    Serology:   Imaging: If present, new imagings (plain films, ct scans, and mri) have been personally visualized and interpreted; radiology reports have been reviewed. Decision making incorporated into the Impression / Recommendations.  3/9 ct abd pelv with  contrast 1. No acute intra-abdominal or pelvic pathology. 2. Sigmoid diverticulosis. No bowel obstruction. Normal appendix. 3. Bibasilar subpleural streaky and reticular atelectasis or infiltrate. Aspiration is not excluded clinical correlation is recommended. 4.  Aortic Atherosclerosis     3/9 cxr FINDINGS: Stable cardiomegaly. Aortic atherosclerotic calcification. Pulmonary vascular congestion. Unchanged mild left basilar airspace opacities. No definite pleural effusion. No pneumothorax. No acute osseous abnormality. Postoperative changes right humeral head.   IMPRESSION: 1. Cardiomegaly  with pulmonary vascular congestion. 2. Unchanged mild left basilar atelectasis/scarring.     3/10 mri lumbar thoracic spine wwo contrast 1. Longitudinally extensive epidural abscess extending from at least C7-T1 caudally through the L1-2. Based on the localizer images, suspect involvement of the cervical spinal canal as well. 2. Multifocal moderate to severe compression of the spinal cord at T8-9, T10-11, T11-12. 3. Disc edema with adjacent endplate marrow edema and enhancement at T6-7, T8-9, T10-11 and T11-12. Findings are suspicious for multifocal discitis-osteomyelitis, though there is little correlate on the recent CTA chest. 4. Clumping of the cauda equina nerve roots and conus with associated leptomeningeal enhancement, consistent with superimposed arachnoiditis.   3/11 tte  1. Left ventricular ejection fraction, by estimation, is 70 to 75%. The left ventricle has hyperdynamic function. The left ventricle has no regional wall motion abnormalities. There is mild asymmetric left ventricular hypertrophy of the basal-septal  segment. Left ventricular diastolic parameters are indeterminate.   2. Right ventricular systolic function is normal. The right ventricular size is normal.   3. Left atrial size was mild to moderately dilated.   4. Right atrial size was mild to moderately dilated.   5. The  mitral valve is normal in structure. No evidence of mitral valve regurgitation. No evidence of mitral stenosis.   6. The aortic valve is tricuspid. There is moderate calcification of the aortic valve. Aortic valve regurgitation is trivial. Mild aortic valve stenosis. Aortic valve area, by VTI measures 1.43 cm. Aortic valve mean gradient measures 17.0 mmHg. Aortic  valve Vmax measures 2.73 m/s.   7. The inferior vena cava is normal in size with greater than 50% respiratory variability, suggesting right atrial pressure of 3 mmHg.   8. No TTE evidence of endocarditis. If clinical suspicion is high would recommend TEE.       Jabier Mutton, [facility] [place] [phone] pager    07/16/2022, 12:31 PM

## 2022-07-16 NOTE — Progress Notes (Signed)
PROGRESS NOTE    [name]  [id number] DOB: May 03, 1930 DOA: 06/26/2022 PCP: [name], DO    Brief Narrative:  89 year old female with past medical history of chronic hypoxemic respiratory failure on 4 L of oxygen, chronic diastolic heart failure, diabetes mellitus type 2, DVT on Xarelto admitted to [facility] long hospital on 07/04/2022 for right-sided chest discomfort, found to be sepsis due to pneumonia, started on Rocephin and Zithromax. On 07/15/2022 blood culture positive for MRSA, imaging showed epidural abscess with cord compression, disc edema. Neurosurgery was consulted, patient was transferred to [facility] for incision and drainage of T9, T10 and T11, surgical intervention with laminectomy.  Remains in the hospital.  Remains on IV antibiotics.  Pain control is major issue.  Very frail and debilitated.   Assessment & Plan:  Sepsis due to MRSA bacteremia -Extensive abscess of thoracic lumbar spine, T9, T10, T11 -S/p laminectomy and I&D  -Blood cultures grew MRSA on 07/18/2022 -Repeat blood cultures on 07/06/2022 remain negative to date -ID consulted, TTE showed no evidence of vegetation.  No Wall motion normality, mild aortic stenosis. -ID recommends to hold off on TEE for now as repeat blood cultures are negative -Central line right IJ removed on 07/07/2022 -Vancomycin switched to daptomycin per ID   -As per ID recommendations Continue daptomycin, ID clinic follow-up before changing any plans.  Plans for total 8 weeks, however they will see in the ID clinic and transition to oral linezolid after first 4 weeks. Snf orders with daptomycin Weekly cbc, cmp, crp and cpk Fax weekly labs to [phone] Patient will need a PICC line, will communicate with ID team before placing orders.   Delirium -Likely medication induced,  -she was started on Haldol and melatonin -Continue delirium precautions -Pain control. -Gradually improving now.   Chronic respiratory failure with  hypoxemia and hypercarbia -COPD, not in exacerbation -Patient is currently on 2-3 L of oxygen by nasal cannula -Wean off oxygen as tolerated   Hypertension -Hold HCTZ, continue amlodipine, Imdur.  Stable.  Will start patient on low-dose Lasix to improve her potassium retention.   Hypervolemic hyponatremia -Sodium has been remaining 127 -126.   -serum osmolality: 271 -Continue fluid restriction to 1.2 L/day  -Follow urine osmolality   History of DVT -Patient is on Xarelto at home that was restarted.      Diabetes mellitus type 2 -Hemoglobin A1c greater than 10.0 in January of this year -CBG well-controlled -Continue very sensitive sliding scale with NovoLog -Started on Semglee 5 units subcu daily, avoid hypoglycemia. -Since patient does not have good appetite and intake, will keep on low-dose of insulin.   Hyperkalemia -Persistent.  Low potassium diet.  Redose Lokelma.  Start Lasix.  Recheck tomorrow morning.  Goal of care discussions: Patient remains in very poor clinical status, very frail and debilitated, pain control and anxiety is major issue.  She has been in the hospital at least since last 3 months, in and out of the hospital to a SNF.  Currently struggles with pain control, symptom control including sleep and agitation.  Remains full code. Will consult palliative care team to help goal of care, symptom control medications.  Family agreeable to meet with palliative care. Discontinue all injectable pain medications, increase dose of oxycodone to facilitate discharge and outpatient management.  If persistent pain issue, will start patient on long-acting narcotics like OxyContin.  Discussed with ID, will place PICC line.   DVT prophylaxis: SCD's Start: 07/25/2022 1933 SCDs Start: 07/04/22 0215 rivaroxaban (XARELTO) tablet  20 mg   Code Status: Full code Family Communication: Daughter on the phone Disposition Plan: Status is: Inpatient Remains inpatient appropriate because:  IV antibiotics, difficult to control pain     Consultants:  ID Neurosurgery Palliative   Procedures:  I&D as above  Antimicrobials:  Daptomycin 3/15---   Subjective:  Patient seen and examined.  Today she was very comfortable, able to keep up some conversation.  Patient tells me she feels fine.  Overnight she complained of severe pain all over the body and was given additional doses of Dilaudid.  She was attempting to eat some breakfast. Called and discussed with patient's daughter who thought she was little more comfortable today.  Remains afebrile. She does have anasarca with diffuse edema of the legs and hands.  Objective: Vitals:   07/16/22 0100 07/16/22 0455 07/16/22 0500 07/16/22 0803  BP: 110/66 101/84    Pulse: 71 67    Resp: 20 15    Temp: (!) 97.5 F (36.4 C) 97.8 F (36.6 C)    TempSrc: Axillary Axillary    SpO2: 96% 96%  94%  Weight:   100.1 kg   Height:        Intake/Output Summary (Last 24 hours) at 07/16/2022 1135 Last data filed at 07/16/2022 0815 Gross per 24 hour  Intake 1062 ml  Output 450 ml  Net 612 ml   Filed Weights   07/09/22 0459 07/14/22 0500 07/16/22 0500  Weight: 92.2 kg 98 kg 100.1 kg    Examination:  General exam: Appears anxious.  Frail and debilitated.  Chronically sick looking. Respiratory system: Clear to auscultation.  Poor respiratory effort.  On 3-4 L oxygen. Cardiovascular system: S1 & S2 heard, RRR.  Bilateral chronic lymphedema. Moderate edema around ankle and dorsum of the feet. Gastrointestinal system: Distended.  Nontender.  Bowel sounds present. Central nervous system: Alert and awake.  Flat affect.  Oriented to self.  Pleasant to conversation today. Extremities: Symmetric 5 x 5 power.  Profound generalized weakness.     Data Reviewed: I have personally reviewed following labs and imaging studies  CBC: Recent Labs  Lab 07/10/22 0428  WBC 13.6*  HGB 8.3*  HCT 25.8*  MCV 87.8  PLT 123456   Basic Metabolic  Panel: Recent Labs  Lab 07/13/22 0633 07/14/22 0247 07/14/22 1613 07/15/22 0303 07/16/22 0446  NA 124* 127* 126* 127* 126*  K 5.2* 5.0 5.2* 5.6* 5.5*  CL 90* 93* 91* 94* 92*  CO2 29 27 25 27 27   GLUCOSE 204* 77 262* 221* 196*  BUN 20 25* 29* 29* 33*  CREATININE 1.01* 1.03* 1.11* 1.07* 1.08*  CALCIUM 7.9* 7.9* 8.0* 8.0* 8.3*   GFR: Estimated Creatinine Clearance: 40.6 mL/min (A) (by C-G formula based on SCr of 1.08 mg/dL (H)). Liver Function Tests: No results for input(s): "AST", "ALT", "ALKPHOS", "BILITOT", "PROT", "ALBUMIN" in the last 168 hours. No results for input(s): "LIPASE", "AMYLASE" in the last 168 hours. No results for input(s): "AMMONIA" in the last 168 hours. Coagulation Profile: No results for input(s): "INR", "PROTIME" in the last 168 hours. Cardiac Enzymes: Recent Labs  Lab 07/16/22 0446  CKTOTAL 445*   BNP (last 3 results) No results for input(s): "PROBNP" in the last 8760 hours. HbA1C: No results for input(s): "HGBA1C" in the last 72 hours. CBG: Recent Labs  Lab 07/15/22 0846 07/15/22 1201 07/15/22 1719 07/15/22 2152 07/16/22 0622  GLUCAP 172* 162* 184* 211* 179*   Lipid Profile: No results for input(s): "CHOL", "HDL", "LDLCALC", "TRIG", "  CHOLHDL", "LDLDIRECT" in the last 72 hours. Thyroid Function Tests: No results for input(s): "TSH", "T4TOTAL", "FREET4", "T3FREE", "THYROIDAB" in the last 72 hours. Anemia Panel: No results for input(s): "VITAMINB12", "FOLATE", "FERRITIN", "TIBC", "IRON", "RETICCTPCT" in the last 72 hours. Sepsis Labs: No results for input(s): "PROCALCITON", "LATICACIDVEN" in the last 168 hours.  No results found for this or any previous visit (from the past 240 hour(s)).        Radiology Studies: [place] EKG SITE RITE  Result Date: 07/16/2022 If [facility] image not attached, placement could not be confirmed due to current cardiac rhythm.       Scheduled Meds:  amLODipine  10 mg Oral Daily   brimonidine  1 drop Both  Eyes Daily   brinzolamide  1 drop Both Eyes Daily   Chlorhexidine Gluconate Cloth  6 each Topical Daily   furosemide  40 mg Oral Daily   Gerhardt's butt cream   Topical Daily   insulin aspart  0-6 Units Subcutaneous TID WC   insulin glargine-yfgn  5 Units Subcutaneous Daily   isosorbide mononitrate  60 mg Oral Daily   latanoprost  1 drop Both Eyes QHS   metoprolol tartrate  12.5 mg Oral BID   pantoprazole  40 mg Oral Daily   polyethylene glycol  17 g Oral Daily   rivaroxaban  20 mg Oral Q supper   senna-docusate  1 tablet Oral BID   sodium chloride flush  3 mL Intravenous Q12H   sodium chloride  2 g Oral BID WC   sodium polystyrene  30 g Rectal Once   umeclidinium-vilanterol  1 puff Inhalation Daily   Continuous Infusions:  sodium chloride     DAPTOmycin (CUBICIN) 600 mg in sodium chloride 0.9 % IVPB 124 mL/hr at 07/16/22 0524     LOS: 12 days    Time spent: 35 minutes     Barb Merino, MD [facility] Pager [phone]

## 2022-07-17 ENCOUNTER — Inpatient Hospital Stay (HOSPITAL_COMMUNITY): Payer: Medicare Other

## 2022-07-17 DIAGNOSIS — I5032 Chronic diastolic (congestive) heart failure: Secondary | ICD-10-CM | POA: Diagnosis not present

## 2022-07-17 DIAGNOSIS — R7881 Bacteremia: Secondary | ICD-10-CM | POA: Diagnosis not present

## 2022-07-17 DIAGNOSIS — G061 Intraspinal abscess and granuloma: Secondary | ICD-10-CM | POA: Diagnosis not present

## 2022-07-17 DIAGNOSIS — M4620 Osteomyelitis of vertebra, site unspecified: Secondary | ICD-10-CM | POA: Diagnosis not present

## 2022-07-17 DIAGNOSIS — J189 Pneumonia, unspecified organism: Secondary | ICD-10-CM | POA: Diagnosis not present

## 2022-07-17 DIAGNOSIS — B9562 Methicillin resistant Staphylococcus aureus infection as the cause of diseases classified elsewhere: Secondary | ICD-10-CM | POA: Diagnosis not present

## 2022-07-17 LAB — CBC
HCT: 24.9 % — ABNORMAL LOW (ref 36.0–46.0)
Hemoglobin: 7.8 g/dL — ABNORMAL LOW (ref 12.0–15.0)
MCH: 28.3 pg (ref 26.0–34.0)
MCHC: 31.3 g/dL (ref 30.0–36.0)
MCV: 90.2 fL (ref 80.0–100.0)
Platelets: 272 10*3/uL (ref 150–400)
RBC: 2.76 MIL/uL — ABNORMAL LOW (ref 3.87–5.11)
RDW: 15.6 % — ABNORMAL HIGH (ref 11.5–15.5)
WBC: 9.9 10*3/uL (ref 4.0–10.5)
nRBC: 0.2 % (ref 0.0–0.2)

## 2022-07-17 LAB — GLUCOSE, CAPILLARY
Glucose-Capillary: 142 mg/dL — ABNORMAL HIGH (ref 70–99)
Glucose-Capillary: 201 mg/dL — ABNORMAL HIGH (ref 70–99)
Glucose-Capillary: 147 mg/dL — ABNORMAL HIGH (ref 70–99)
Glucose-Capillary: 138 mg/dL — ABNORMAL HIGH (ref 70–99)

## 2022-07-17 LAB — BASIC METABOLIC PANEL
Anion gap: 9 (ref 5–15)
BUN: 39 mg/dL — ABNORMAL HIGH (ref 8–23)
CO2: 25 mmol/L (ref 22–32)
Calcium: 8.5 mg/dL — ABNORMAL LOW (ref 8.9–10.3)
Chloride: 92 mmol/L — ABNORMAL LOW (ref 98–111)
Creatinine, Ser: 1.17 mg/dL — ABNORMAL HIGH (ref 0.44–1.00)
GFR, Estimated: 45 mL/min — ABNORMAL LOW (ref >60)
Glucose, Bld: 136 mg/dL — ABNORMAL HIGH (ref 70–99)
Potassium: 5.8 mmol/L — ABNORMAL HIGH (ref 3.5–5.1)
Sodium: 126 mmol/L — ABNORMAL LOW (ref 135–145)

## 2022-07-17 MED ORDER — SODIUM CHLORIDE 0.9% FLUSH
10.0000 mL | Freq: Two times a day (BID) | INTRAVENOUS | Status: DC
  Administered 2022-07-17 – 2022-07-19 (×3): 10 mL

## 2022-07-17 MED ORDER — SODIUM ZIRCONIUM CYCLOSILICATE 10 G PO PACK
10.0000 g | PACK | Freq: Every day | ORAL | Status: DC
  Administered 2022-07-17 – 2022-07-19 (×3): 10 g via ORAL
  Filled 2022-07-17 (×3): qty 1

## 2022-07-17 MED ORDER — NALOXONE HCL 0.4 MG/ML IJ SOLN: 0.4000 mg | INTRAMUSCULAR | Status: DC | PRN

## 2022-07-17 MED ORDER — HALOPERIDOL LACTATE 5 MG/ML IJ SOLN
5.0000 mg | Freq: Once | INTRAMUSCULAR | Status: AC | PRN
  Administered 2022-07-17: 5 mg via INTRAVENOUS
  Filled 2022-07-17: qty 1

## 2022-07-17 MED ORDER — SODIUM CHLORIDE 0.9% FLUSH: 10.0000 mL | INTRAVENOUS | Status: DC | PRN

## 2022-07-17 MED ORDER — HYDROMORPHONE HCL 1 MG/ML IJ SOLN
0.5000 mg | Freq: Once | INTRAMUSCULAR | Status: AC | PRN
  Administered 2022-07-17: 0.5 mg via INTRAVENOUS
  Filled 2022-07-17: qty 0.5

## 2022-07-17 NOTE — Plan of Care (Addendum)
Pt received percocet x 1. Pt alert and oriented x 3. Yellow mews at this time due to temp 94.4 rectal. See previous notes.  Problem: Coping: Goal: Ability to adjust to condition or change in health will improve Outcome: Progressing   Problem: Health Behavior/Discharge Planning: Goal: Ability to identify and utilize available resources and services will improve Outcome: Progressing Goal: Ability to manage health-related needs will improve Outcome: Progressing   Problem: Metabolic: Goal: Ability to maintain appropriate glucose levels will improve Outcome: Progressing   Problem: Nutritional: Goal: Maintenance of adequate nutrition will improve Outcome: Progressing Goal: Progress toward achieving an optimal weight will improve Outcome: Progressing   Problem: Skin Integrity: Goal: Risk for impaired skin integrity will decrease Outcome: Progressing   Problem: Tissue Perfusion: Goal: Adequacy of tissue perfusion will improve Outcome: Progressing   Problem: Fluid Volume: Goal: Ability to maintain a balanced intake and output will improve Outcome: Not Progressing  Arms still weeping pads placed arms elevated.

## 2022-07-17 NOTE — Progress Notes (Addendum)
Pt had been complaining during overnight that she was hot. Blankets and gown repeatedly pulled off. Fan on high. This am Tech unable to obtain temp. Placed blankets. Rectal temp taken. Hot packs and warm blankets placed. Dr. Glo Herring ordered bear hugger. Secretary this am calling in order.    07/17/22 [id number]  Assess: MEWS Score  Temp (!) 94 F (34.4 C)  BP (!) 128/57  MAP (mmHg) 78  Pulse Rate 62  ECG Heart Rate 65  Resp 18  SpO2 90 %  O2 Device Nasal Cannula  O2 Flow Rate (L/min) 5 L/min  Assess: MEWS Score  MEWS Temp 2  MEWS Systolic 0  MEWS Pulse 0  MEWS RR 0  MEWS LOC 0  MEWS Score 2  MEWS Score Color Yellow  Assess: if the MEWS score is Yellow or Red  Were vital signs taken at a resting state? Yes  Focused Assessment No change from prior assessment  Does the patient meet 2 or more of the SIRS criteria? No  Does the patient have a confirmed or suspected source of infection? Yes  Provider and Rapid Response Notified? No (Provider)  MEWS guidelines implemented  Yes, yellow  Treat  MEWS Interventions Considered administering scheduled or prn medications/treatments as ordered  Take Vital Signs  Increase Vital Sign Frequency  Yellow: Q2hr x1, continue Q4hrs until patient remains green for 12hrs  Escalate  MEWS: Escalate Yellow: Discuss with charge nurse and consider notifying provider and/or RRT  Notify: Charge Nurse/RN  Name of Charge Nurse/RN Notified [facility]  Provider Notification  Provider Name/Title Dr. Glo Herring  Date Provider Notified 07/17/22  Time Provider Notified [phone]  Method of Notification  (amion)  Notification Reason Other (Comment) (yellow mews temp 94.)  Provider response No new orders  Date of Provider Response 07/17/22  Time of Provider Response 0645  Assess: SIRS CRITERIA  SIRS Temperature  1  SIRS Pulse 0  SIRS Respirations  0  SIRS WBC 0  SIRS Score Sum  1

## 2022-07-17 NOTE — Progress Notes (Signed)
Occupational Therapy Treatment Patient Details Name: [name] MRN: [id number] DOB: 11-05-1930 Today's Date: 07/17/2022   History of present illness 89 yo female presenting 3/8 with abdominal pain. Work up revealed CAP. Pt transferred to [facility] on 3/10 after MRI showed "extensive epidural abscess extending from at least C7 / T1 caudally through the L1-2 with multifocal mod / severe compression of the spinal cord at T8-9, T10-11, T11-12" And is now s/p T9-11 laminectomy with evacuation of epidural abscess on 3/10. PMH includes: cellulitis of Left leg, CHF, COPD, DM II, glaucoma, HLD, and HTN.   OT comments  Patient received in supine asking for something to drink and explained to patient she is on fluid restrictions. Patient participated well with OT treatment with patient continues to require cues for back precautions and mod assist for bed mobility and transfers with RW. Patient instructed on pursed lip breathing to increase SpO2 which ranged from 29-86, daughter in room re enforcing education. Patient would benefit from continued OT in SNF setting to increase activity tolerance and independence and safety with self care and functional transfers. Acute OT to continue to follow.    Recommendations for follow up therapy are one component of a multi-disciplinary discharge planning process, led by the attending physician.  Recommendations may be updated based on patient status, additional functional criteria and insurance authorization.    Follow Up Recommendations  Skilled nursing-short term rehab (<3 hours/day)     Assistance Recommended at Discharge Frequent or constant Supervision/Assistance  Patient can return home with the following  Assistance with cooking/housework;Direct supervision/assist for medications management;Direct supervision/assist for financial management;Help with stairs or ramp for entrance;Assist for transportation;Two people to help with walking and/or transfers;Two people  to help with bathing/dressing/bathroom   Equipment Recommendations  Other (comment) (TBD)    Recommendations for Other Services      Precautions / Restrictions Precautions Precautions: Fall;Back Precaution Booklet Issued: Yes (comment) Precaution Comments: needs cues to maintain precautions Restrictions Weight Bearing Restrictions: No       Mobility Bed Mobility Overal bed mobility: Needs Assistance Bed Mobility: Rolling, Sidelying to Sit Rolling: Min assist Sidelying to sit: Mod assist, HOB elevated       General bed mobility comments: cues for technique and back precautions    Transfers Overall transfer level: Needs assistance Equipment used: Rolling walker (2 wheels) Transfers: Sit to/from Stand, Bed to chair/wheelchair/BSC Sit to Stand: Mod assist     Step pivot transfers: Mod assist     General transfer comment: cues for hand placement and mod assist to power up. Mod assist to pivot with assistance managing RW     Balance Overall balance assessment: Needs assistance Sitting-balance support: Feet supported, Bilateral upper extremity supported Sitting balance-Leahy Scale: Poor Sitting balance - Comments: min guard seated on EOB Postural control: Posterior lean Standing balance support: Bilateral upper extremity supported, During functional activity Standing balance-Leahy Scale: Poor Standing balance comment: reliant on BUE support when standing with mod assist for balance                           ADL either performed or assessed with clinical judgement   ADL Overall ADL's : Needs assistance/impaired     Grooming: Wash/dry hands;Wash/dry face;Oral care;Supervision/safety;Sitting Grooming Details (indicate cue type and reason): seated in recliner                 Toilet Transfer: Moderate assistance;Rolling walker (2 wheels) Toilet Transfer Details (indicate cue type  and reason): simulated to recliner           General ADL  Comments: increased time to perform tasks    Extremity/Trunk Assessment              Vision       Perception     Praxis      Cognition Arousal/Alertness: Awake/alert Behavior During Therapy: WFL for tasks assessed/performed Overall Cognitive Status: Impaired/Different from baseline Area of Impairment: Attention, Memory, Following commands, Safety/judgement, Awareness, Problem solving                   Current Attention Level: Sustained Memory: Decreased short-term memory, Decreased recall of precautions Following Commands: Follows one step commands consistently, Follows one step commands with increased time Safety/Judgement: Decreased awareness of safety, Decreased awareness of deficits Awareness: Intellectual Problem Solving: Slow processing, Decreased initiation, Difficulty sequencing, Requires verbal cues General Comments: requires increased time to follow commands, focused on wanting something to drink, on fluid restrictions        Exercises      Shoulder Instructions       General Comments 6L O2, SpO2 78-86 with cues for pursed lip breathing, BP 126/80 HR 72    Pertinent Vitals/ Pain       Pain Assessment Pain Assessment: Faces Faces Pain Scale: Hurts little more Pain Location: back and generalized pain Pain Descriptors / Indicators: Discomfort, Grimacing, Moaning Pain Intervention(s): Limited activity within patient's tolerance, Monitored during session, Repositioned  Home Living                                          Prior Functioning/Environment              Frequency  Min 2X/week        Progress Toward Goals  OT Goals(current goals can now be found in the care plan section)  Progress towards OT goals: Progressing toward goals  Acute Rehab OT Goals Patient Stated Goal: feel better Time For Goal Achievement: 07/13/2022 Potential to Achieve Goals: Fair ADL Goals Pt Will Perform Lower Body Dressing: with  supervision;sit to/from stand Pt Will Transfer to Toilet: with supervision;ambulating;regular height toilet;grab bars Pt Will Perform Toileting - Clothing Manipulation and hygiene: with supervision;sit to/from stand Additional ADL Goal #1: Patient will perform 10 min functional activity or exercise activity as evidence of improving activity tolerance  Plan Discharge plan remains appropriate;Frequency remains appropriate    Co-evaluation                 AM-PAC OT "6 Clicks" Daily Activity     Outcome Measure   Help from another person eating meals?: A Little Help from another person taking care of personal grooming?: A Little Help from another person toileting, which includes using toliet, bedpan, or urinal?: A Lot Help from another person bathing (including washing, rinsing, drying)?: A Lot Help from another person to put on and taking off regular upper body clothing?: A Lot Help from another person to put on and taking off regular lower body clothing?: Total 6 Click Score: 13    End of Session Equipment Utilized During Treatment: Gait belt;Rolling walker (2 wheels);Oxygen (6liters)  OT Visit Diagnosis: Muscle weakness (generalized) (M62.81);Pain;Unsteadiness on feet (R26.81) Pain - part of body:  (back)   Activity Tolerance Patient tolerated treatment well;Patient limited by fatigue   Patient Left in chair;with call bell/phone within reach;with  chair alarm set;with family/visitor present   Nurse Communication Mobility status        Time: [id number] OT Time Calculation (min): 24 min  Charges: OT General Charges $OT Visit: 1 Visit OT Treatments $Self Care/Home Management : 8-22 mins $Therapeutic Activity: 8-22 mins  Lodema Hong, OTA Acute Rehabilitation Services  Office [phone]   Trixie Dredge 07/17/2022, 9:55 AM

## 2022-07-17 NOTE — Progress Notes (Signed)
Physical Therapy Treatment Patient Details Name: [name] MRN: [id number] DOB: 09-Feb-1931 Today's Date: 07/17/2022   History of Present Illness 89 yo female presenting 3/8 with abdominal pain. Work up revealed CAP. Pt transferred to [facility] on 3/10 after MRI showed "extensive epidural abscess extending from at least C7 / T1 caudally through the L1-2 with multifocal mod / severe compression of the spinal cord at T8-9, T10-11, T11-12" And is now s/p T9-11 laminectomy with evacuation of epidural abscess on 3/10. PMH includes: cellulitis of Left leg, CHF, COPD, DM II, glaucoma, HLD, and HTN.    PT Comments    Pt is continuing to display further decline in her respiratory status, being transitioned to 15L O2 with the non-rebreather at start of session. However, her sats remained in the 90s% with this throughout the session. Pt also limited in mobility progression this date by having a BM each time she stood. She was able to progress from needing modA to transfer to stand to minA by the end of the session, needing repeated cues for hand placement with transfers. Performed seated LAQs and standing marching to promote increased lower extremity strength. Pt had difficulty shifting her weight adequately and lifting the contralateral leg to march in place though. Will continue to follow acutely. Current recommendations remain appropriate.     Recommendations for follow up therapy are one component of a multi-disciplinary discharge planning process, led by the attending physician.  Recommendations may be updated based on patient status, additional functional criteria and insurance authorization.  Follow Up Recommendations  Skilled nursing-short term rehab (<3 hours/day) Can patient physically be transported by private vehicle: No   Assistance Recommended at Discharge Frequent or constant Supervision/Assistance  Patient can return home with the following Two people to help with walking and/or transfers;Two  people to help with bathing/dressing/bathroom;Direct supervision/assist for medications management;Direct supervision/assist for financial management;Assist for transportation;Help with stairs or ramp for entrance;Assistance with feeding;Assistance with cooking/housework   Equipment Recommendations  Other (comment) (defer to post acute)    Recommendations for Other Services       Precautions / Restrictions Precautions Precautions: Fall;Back Precaution Booklet Issued: Yes (comment) Precaution Comments: needs cues to maintain precautions Required Braces or Orthoses:  (no brace needed orders) Restrictions Weight Bearing Restrictions: No     Mobility  Bed Mobility Overal bed mobility: Needs Assistance Bed Mobility: Rolling, Sidelying to Sit, Sit to Sidelying Rolling: Min assist Sidelying to sit: Mod assist, HOB elevated     Sit to sidelying: Mod assist, HOB elevated General bed mobility comments: Verbal cues provided to flex R leg and reach R UE to L bed rail to log roll, minA to complete rotation of trunk. ModA to ascend trunk with pt bringing legs off EOB as cued and with assistance. ModA to direct pt to lean to her L elbow to descend her trunk and modA to lift her legs onto the bed with return to sidelying > supine.    Transfers Overall transfer level: Needs assistance Equipment used: Rolling walker (2 wheels) Transfers: Sit to/from Stand Sit to Stand: Mod assist, Min assist           General transfer comment: Pt needing repeated reminders to place hands on bed rather than pull up on RW to stand. x3 reps from EOB with modA the first x2 reps and minA the final 3rd rep    Ambulation/Gait             Pre-gait activities: marching in place with RW and  minA for balance General Gait Details: deferred, pt now on non-rebreather due to worsening hypoxia this date and having BM each time she stood   Marine [employer] [place]  (Stroke Patients Only)       Balance Overall balance assessment: Needs assistance Sitting-balance support: Feet supported, Bilateral upper extremity supported Sitting balance-Leahy Scale: Poor Sitting balance - Comments: Min guard assist to sit EOB with UE support. Needs cues to not lean anteriorly with arms propped up on RW Postural control: Other (comment) (anterior lean when propping UEs on RW) Standing balance support: Bilateral upper extremity supported, During functional activity Standing balance-Leahy Scale: Poor Standing balance comment: Min-modA with bil UE support                            Cognition Arousal/Alertness: Awake/alert Behavior During Therapy: WFL for tasks assessed/performed Overall Cognitive Status: Impaired/Different from baseline Area of Impairment: Attention, Memory, Following commands, Safety/judgement, Awareness, Problem solving                   Current Attention Level: Sustained Memory: Decreased short-term memory, Decreased recall of precautions Following Commands: Follows one step commands consistently, Follows one step commands with increased time Safety/Judgement: Decreased awareness of safety, Decreased awareness of deficits Awareness: Intellectual Problem Solving: Slow processing, Decreased initiation, Difficulty sequencing, Requires verbal cues General Comments: HOH, needing extra time to process and repetition of cues. Needs cues to maintain back precautions. Poor awareness of deficits. Pt asking about getting water to drink even though repeatedly educated she is on fluid restrictions order.        Exercises General Exercises - Lower Extremity Long Arc Quad: AROM, Strengthening, Both, Seated, 10 reps Hip Flexion/Marching: AROM, Strengthening, Both, 10 reps, Standing (with RW)    General Comments General comments (skin integrity, edema, etc.): Pt with SpO2 in 70s% upon arrival with RN switching pt to non-rebreather, 15L;  SPO2 in 90s% throughout on non-rebreather      Pertinent Vitals/Pain Pain Assessment Pain Assessment: Faces Faces Pain Scale: Hurts little more Pain Location: generalized with movement, moanining at rest intermittently also Pain Descriptors / Indicators: Discomfort, Grimacing, Moaning Pain Intervention(s): Limited activity within patient's tolerance, Monitored during session, Repositioned    Home Living                          Prior Function            PT Goals (current goals can now be found in the care plan section) Acute Rehab PT Goals Patient Stated Goal: to improve PT Goal Formulation: With patient Time For Goal Achievement: 07/20/22 Potential to Achieve Goals: Fair Progress towards PT goals: Progressing toward goals    Frequency    Min 3X/week      PT Plan Current plan remains appropriate    Co-evaluation              AM-PAC PT "6 Clicks" Mobility   Outcome Measure  Help needed turning from your back to your side while in a flat bed without using bedrails?: A Little Help needed moving from lying on your back to sitting on the side of a flat bed without using bedrails?: A Lot Help needed moving to and from a bed to a chair (including a wheelchair)?: A Lot Help needed standing up from a chair using  your arms (e.g., wheelchair or bedside chair)?: A Lot Help needed to walk in hospital room?: Total Help needed climbing 3-5 steps with a railing? : Total 6 Click Score: 11    End of Session Equipment Utilized During Treatment: Oxygen;Gait belt Activity Tolerance: Patient limited by pain;Other (comment) (limited by BMs; limited by respiratory status today) Patient left: with call bell/phone within reach;in bed;with bed alarm set Nurse Communication: Mobility status;Other (comment) (SpO2) PT Visit Diagnosis: Other abnormalities of gait and mobility (R26.89);Muscle weakness (generalized) (M62.81);Unsteadiness on feet (R26.81);Difficulty in walking,  not elsewhere classified (R26.2)     Time: [id number] PT Time Calculation (min) (ACUTE ONLY): 32 min  Charges:  $Therapeutic Exercise: 8-22 mins $Therapeutic Activity: 8-22 mins                     Moishe Spice, PT, DPT Acute Rehabilitation Services  Office: [phone]    Orvan Falconer 07/17/2022, 3:02 PM

## 2022-07-17 NOTE — Progress Notes (Addendum)
Peripherally Inserted Central Catheter Placement  The IV Nurse has discussed with the patient and/or persons authorized to consent for the patient, the purpose of this procedure and the potential benefits and risks involved with this procedure.  The benefits include less needle sticks, lab draws from the catheter, and the patient may be discharged home with the catheter. Risks include, but not limited to, infection, bleeding, blood clot (thrombus formation), and puncture of an artery; nerve damage and irregular heartbeat and possibility to perform a PICC exchange if needed/ordered by physician.  Alternatives to this procedure were also discussed.  Bard Power PICC patient education guide, fact sheet on infection prevention and patient information card has been provided to patient /or left at bedside.    PICC Placement Documentation  PICC Single Lumen 07/17/22 Left Brachial 43 cm 0 cm (Active)  Indication for Insertion or Continuance of Line Home intravenous therapies (PICC only) 07/17/22 1112  Exposed Catheter (cm) 0 cm 07/17/22 1112  Site Assessment Clean, Dry, Intact;Bruised 07/17/22 1112  Line Status Flushed;Saline locked;Blood return noted 07/17/22 1112  Dressing Type Transparent 07/17/22 1112  Dressing Status  Gauze/pressure dressing 07/17/22 1112  Safety Lock Not Applicable [id number]  Line Care Connections checked and tightened 07/17/22 1112  Line Adjustment (NICU/IV Team Only) No 07/17/22 1112  Dressing Intervention New dressing 07/17/22 1112  Dressing Change Due 07/24/22 07/17/22 [place] 07/17/2022, 11:16 AM

## 2022-07-17 NOTE — Progress Notes (Signed)
Palliative Medicine Progress Note   Patient Name: [name]       Date: 07/17/2022 DOB: 22-Feb-1931  Age: 89 y.o. MRN#: [id number] Attending Physician: Barb Merino, MD Primary Care Physician: [name], DO Admit Date: 07/04/2022   HPI/Patient Profile: 89 y.o. female  with past medical history of COPD and chronic hypoxemic respiratory failure on 4L of oxygen, chronic diastolic heart failure, diabetes mellitus type 2, and DVT on Xarelto who presented to [facility] ED on 07/13/2022 with right-sided chest discomfort.  She was admitted with sepsis secondary to pneumonia.  On 07/25/2022 blood cultures were positive for MRSA and imaging showed epidural abscess with cord compression.  Patient was transferred to [facility] for incision and drainage of T9, T10, and T11 with laminectomy.   Palliative Medicine was consulted for goals of care.    Subjective: Chart reviewed and patient assessed at bedside. She underwent PICC placement this morning. She is currently sleeping and I did not attempt to wake her.   I spoke with daughter/Scottie by phone. We reviewed advanced directives - concepts specific to code status, artifical feeding and hydration, and rehospitalization were considered and discussed.   An electronic MOST form was completed today. The family has outlined their wishes for the following treatment decisions:  Cardiopulmonary Resuscitation: Do Not Attempt Resuscitation (DNR/No CPR)  Medical Interventions: Limited Additional Interventions: Use medical treatment, IV fluids and cardiac monitoring as indicated, DO NOT USE intubation or mechanical ventilation. May consider use of less invasive airway support such as BiPAP or CPAP. Also provide comfort measures. Transfer to the hospital if indicated. Avoid  intensive care.   Antibiotics: Antibiotics if indicated  IV Fluids: IV fluids for a defined trial period  Feeding Tube: No feeding tube     Objective:  Physical Exam Vitals reviewed.  Constitutional:      General: She is not in acute distress.    Appearance: She is obese. She is ill-appearing.  Neurological:     Mental Status: She is lethargic.             Vital Signs: BP (!) 128/57   Pulse 62   Temp (!) 94.4 F (34.7 C) (Rectal)   Resp 18   Ht 5' 4.02" (1.626 m)   Wt 101.5 kg   SpO2 90%   BMI 38.39 kg/m  SpO2:  SpO2: 90 % O2 Device: O2 Device: Nasal Cannula O2 Flow Rate: O2 Flow Rate (L/min): 6 L/min    Palliative Medicine Assessment & Plan   Assessment: Principal Problem:   CAP (community acquired pneumonia) Active Problems:   Chronic diastolic CHF (congestive heart failure) (HCC)   Essential hypertension   DM2 (diabetes mellitus, type 2) (HCC)   COPD (chronic obstructive pulmonary disease) (HCC)   Acute hyponatremia   History of DVT (deep vein thrombosis)   Anemia of chronic disease   Epidural abscess   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   MRSA bacteremia   Vertebral osteomyelitis (HCC)    Recommendations/Plan: Continue current  supportive interventions MOST form completed (electronically) Goal of care - rehab to improve functional status with ultimate goal to return home Outpatient palliative referral  Code Status: DNR  Primary Decision Maker: Patient's 5 children are in close communication and would work together to make major decisions Scottie is the designated point of contact    Symptom Management:  Oxycodone-acetaminophen 5-325 2 tablets every 4 hours as needed  Prognosis:  Unable to determine  Discharge Planning: SNF/rehab   Thank you for allowing the Palliative Medicine Team to assist in the care of this patient.   MDM - [place]   Lavena Bullion, NP   Please contact Palliative Medicine Team phone at [phone]  for questions and concerns.  For individual providers, please see AMION.

## 2022-07-17 NOTE — Progress Notes (Signed)
PROGRESS NOTE    [name]  [id number] DOB: 01/20/1931 DOA: 07/11/2022 PCP: [name], DO    Brief Narrative:  89 year old female with past medical history of chronic hypoxemic respiratory failure on 4 L of oxygen, chronic diastolic heart failure, diabetes mellitus type 2, DVT on Xarelto admitted to [facility] long hospital on 07/04/2022 for right-sided chest discomfort, found to be sepsis due to pneumonia, started on Rocephin and Zithromax. On 07/15/2022 blood culture positive for MRSA, imaging showed epidural abscess with cord compression, disc edema. Neurosurgery was consulted, patient was transferred to [facility] for incision and drainage of T9, T10 and T11, surgical intervention with laminectomy.  Remains in the hospital.  Remains on IV antibiotics.  Pain control is major issue.  Very frail and debilitated. Remains in the hospital, episodic hypoxemia.  Hyponatremia and hyperkalemia.   Assessment & Plan:  Sepsis due to MRSA bacteremia -Extensive abscess of thoracic lumbar spine, T9, T10, T11 -S/p laminectomy and I&D  -Blood cultures grew MRSA on 07/23/2022 -Repeat blood cultures on 07/06/2022 remain negative to date -ID consulted, TTE showed no evidence of vegetation.  No Wall motion normality, mild aortic stenosis. -ID recommends to hold off on TEE for now as repeat blood cultures are negative -Central line right IJ removed on 07/07/2022 -Vancomycin switched to daptomycin per ID   -As per ID recommendations PICC line placed 3/22.  Discharge antibiotic plans in place from ID team.   Delirium -Likely medication induced,  -she was started on Haldol and melatonin -Continue delirium precautions -Pain control. -Gradually improving now.   Chronic respiratory failure with hypoxemia and hypercarbia -COPD, not in exacerbation -Patient is currently on 2-3 L of oxygen by nasal cannula -Patient is on 4 L oxygen at home, she will not to be able to wean off the oxygen.  She may rather need  more oxygen.   Hypertension -Hold HCTZ due to hyponatremia, continue amlodipine, Imdur.  Stable.  Will start patient on low-dose Lasix to improve her potassium retention.   Hypervolemic hyponatremia -Sodium has been remaining 127 -126.   -serum osmolality: 271 -Continue fluid restriction to 1.2 L/day  -Lasix may help.   History of DVT -Patient is on Xarelto at home that was restarted.      Diabetes mellitus type 2 -Hemoglobin A1c greater than 10.0 in January of this year -CBG well-controlled -Continue very sensitive sliding scale with NovoLog -Started on Semglee 5 units subcu daily, avoid hypoglycemia. -Since patient does not have good appetite and intake, will keep on low-dose of insulin.   Hyperkalemia -Persistent.  Low potassium diet.  Lokelma every day.  Start Lasix.  Recheck tomorrow morning.  Goal of care discussions: Patient remains in very poor clinical status, very frail and debilitated, pain control and anxiety is major issue.  She has been in the hospital at least since last 3 months, in and out of the hospital to a SNF.  Currently struggles with pain control, symptom control including sleep and agitation.   Palliative care consulted, goal of care discussion done 3/21. Trying to manage symptoms with oral pain medications, discontinue injectables. Currently family desires to transfer to a SNF, PICC line today.  Hopefully if her sodium and potassium improves next few days she will be able to go to nursing home.   DVT prophylaxis: SCD's Start: 07/26/2022 1933 SCDs Start: 07/04/22 0215 rivaroxaban (XARELTO) tablet 20 mg   Code Status: Full code Family Communication: Daughter is currently at the bedside Disposition Plan: Status is: Inpatient Remains inpatient  appropriate because: IV antibiotics, pain control, electrolyte abnormalities.   Consultants:  ID Neurosurgery Palliative care   Procedures:  I&D as above PICC line   Antimicrobials:  Daptomycin  3/15---   Subjective:  Patient seen and examined.  Daughter was at the bedside.  Patient herself tells me intermittently that she cannot breathe.  She is trying to eat some breakfast.  Not used injectable pain medication for the last 24 hours.  Pain is managed with oxycodone.  Detailed discussion with daughter as above.  Objective: Vitals:   07/17/22 0600 07/17/22 0639 07/17/22 0649 07/17/22 0833  BP:  (!) 128/57    Pulse: (!) 55 62    Resp:  18    Temp:  (!) 94 F (34.4 C) (!) 94.4 F (34.7 C)   TempSrc:  Rectal Rectal   SpO2:  90% 90% 90%  Weight:      Height:        Intake/Output Summary (Last 24 hours) at 07/17/2022 1335 Last data filed at 07/17/2022 0650 Gross per 24 hour  Intake 100 ml  Output 400 ml  Net -300 ml    Filed Weights   07/14/22 0500 07/16/22 0500 07/17/22 0500  Weight: 98 kg 100.1 kg 101.5 kg    Examination:  General exam: Appears anxious, sitting in chair.  Unable to eat well with tremulousness and frailty.  Frail and debilitated.  Chronically sick looking. Respiratory system: No added sounds.  Poor respiratory effort.  On 3-4 L oxygen. Cardiovascular system: S1 & S2 heard, RRR.  Bilateral chronic lymphedema. Moderate edema around ankle and dorsum of the feet. Gastrointestinal system: Distended.  Nontender.  Bowel sounds present. Central nervous system: Alert and awake.  Flat affect.  Oriented to self.  Pleasant to conversation today. Extremities: Symmetric 5 x 5 power.  Profound generalized weakness.     Data Reviewed: I have personally reviewed following labs and imaging studies  CBC: Recent Labs  Lab 07/17/22 0514  WBC 9.9  HGB 7.8*  HCT 24.9*  MCV 90.2  PLT [id number]    Basic Metabolic Panel: Recent Labs  Lab 07/14/22 0247 07/14/22 1613 07/15/22 0303 07/16/22 0446 07/17/22 0514  NA 127* 126* 127* 126* 126*  K 5.0 5.2* 5.6* 5.5* 5.8*  CL 93* 91* 94* 92* 92*  CO2 27 25 27 27 25   GLUCOSE 77 262* 221* 196* 136*  BUN 25* 29* 29* 33*  39*  CREATININE 1.03* 1.11* 1.07* 1.08* 1.17*  CALCIUM 7.9* 8.0* 8.0* 8.3* 8.5*    GFR: Estimated Creatinine Clearance: 37.8 mL/min (A) (by C-G formula based on SCr of 1.17 mg/dL (H)). Liver Function Tests: No results for input(s): "AST", "ALT", "ALKPHOS", "BILITOT", "PROT", "ALBUMIN" in the last 168 hours. No results for input(s): "LIPASE", "AMYLASE" in the last 168 hours. No results for input(s): "AMMONIA" in the last 168 hours. Coagulation Profile: No results for input(s): "INR", "PROTIME" in the last 168 hours. Cardiac Enzymes: Recent Labs  Lab 07/16/22 0446  CKTOTAL 445*    BNP (last 3 results) No results for input(s): "PROBNP" in the last 8760 hours. HbA1C: No results for input(s): "HGBA1C" in the last 72 hours. CBG: Recent Labs  Lab 07/16/22 1213 07/16/22 1641 07/16/22 2149 07/17/22 0606 07/17/22 1139  GLUCAP 170* 81 138* 142* 201*    Lipid Profile: No results for input(s): "CHOL", "HDL", "LDLCALC", "TRIG", "CHOLHDL", "LDLDIRECT" in the last 72 hours. Thyroid Function Tests: No results for input(s): "TSH", "T4TOTAL", "FREET4", "T3FREE", "THYROIDAB" in the last 72 hours. Anemia Panel:  No results for input(s): "VITAMINB12", "FOLATE", "FERRITIN", "TIBC", "IRON", "RETICCTPCT" in the last 72 hours. Sepsis Labs: No results for input(s): "PROCALCITON", "LATICACIDVEN" in the last 168 hours.  No results found for this or any previous visit (from the past 240 hour(s)).        Radiology Studies: DG CHEST PORT 1 VIEW  Result Date: 07/17/2022 CLINICAL DATA:  PICC line placement EXAM: PORTABLE CHEST 1 VIEW COMPARISON:  07/07/2022 FINDINGS: The right internal jugular central venous catheter is been removed. A left-sided PICC line is present with tip projecting over the lower SVC. No pneumothorax or complicating feature. Increased airspace opacity in both mid lungs and lung bases. Increased density in the right perihilar and suprahilar region potentially from localized  pleural fluid or consolidation. Indistinct costophrenic angles bilaterally. Atherosclerotic calcification of the aortic arch. Left cardiac border is obscured. IMPRESSION: 1. Left-sided PICC line tip: Lower SVC. No pneumothorax or complicating feature. 2. Increased airspace opacity in both mid lungs and lung bases. 3. Increased density in the right perihilar and suprahilar region potentially from localized pleural fluid or consolidation. 4. Indistinct costophrenic angles bilaterally. Electronically Signed   By: Van Clines M.D.   On: 07/17/2022 12:48   [place] EKG SITE RITE  Result Date: 07/16/2022 If Site Rite image not attached, placement could not be confirmed due to current cardiac rhythm.       Scheduled Meds:  amLODipine  10 mg Oral Daily   brimonidine  1 drop Both Eyes Daily   brinzolamide  1 drop Both Eyes Daily   Chlorhexidine Gluconate Cloth  6 each Topical Daily   furosemide  40 mg Oral Daily   Gerhardt's butt cream   Topical Daily   insulin aspart  0-6 Units Subcutaneous TID WC   insulin glargine-yfgn  5 Units Subcutaneous Daily   isosorbide mononitrate  60 mg Oral Daily   latanoprost  1 drop Both Eyes QHS   metoprolol tartrate  12.5 mg Oral BID   pantoprazole  40 mg Oral Daily   polyethylene glycol  17 g Oral Daily   rivaroxaban  20 mg Oral Q supper   senna-docusate  1 tablet Oral BID   sodium chloride flush  3 mL Intravenous Q12H   sodium chloride  2 g Oral BID WC   sodium zirconium cyclosilicate  10 g Oral Daily   umeclidinium-vilanterol  1 puff Inhalation Daily   Continuous Infusions:  sodium chloride     DAPTOmycin (CUBICIN) 600 mg in sodium chloride 0.9 % IVPB 600 mg (07/16/22 2142)     LOS: 13 days    Time spent: 35 minutes     Barb Merino, MD [facility] Pager [phone]

## 2022-07-17 NOTE — Progress Notes (Addendum)
TRH night cross cover note:   I was notified by RN Of the patient's temperature of 94 point94.2, unclear about which route this was taking.  Similar temperature to that documented earlier this morning.  Has order for [facility], with the patient now amenable to initiation of Bair hugger.  Additionally, the patient requested optimization of her pain control.  I subsequently placed a one-time order for as needed IV Dilaudid.  Update: she also appears agitated, recurrently yelling out. I subsequently placed an order for Haldol 5 mg IV x 1 dose prn for agitation.    Babs Bertin, DO Hospitalist

## 2022-07-17 NOTE — Progress Notes (Signed)
TRH night cross cover note:   I was notified by RN of patient's temp of 94.4. other VS appear stable, including HR's in the 60's and normotensive blood pressures.  I subsequently placed order for initiation of Bair hugger.     Babs Bertin, DO Hospitalist

## 2022-07-18 ENCOUNTER — Inpatient Hospital Stay (HOSPITAL_COMMUNITY): Payer: Medicare Other

## 2022-07-18 DIAGNOSIS — J9611 Chronic respiratory failure with hypoxia: Secondary | ICD-10-CM | POA: Diagnosis not present

## 2022-07-18 DIAGNOSIS — J439 Emphysema, unspecified: Secondary | ICD-10-CM | POA: Diagnosis not present

## 2022-07-18 LAB — BASIC METABOLIC PANEL
Anion gap: 4 — ABNORMAL LOW (ref 5–15)
BUN: 45 mg/dL — ABNORMAL HIGH (ref 8–23)
CO2: 25 mmol/L (ref 22–32)
Calcium: 8.1 mg/dL — ABNORMAL LOW (ref 8.9–10.3)
Chloride: 95 mmol/L — ABNORMAL LOW (ref 98–111)
Creatinine, Ser: 1.38 mg/dL — ABNORMAL HIGH (ref 0.44–1.00)
GFR, Estimated: 37 mL/min — ABNORMAL LOW (ref >60)
Glucose, Bld: 139 mg/dL — ABNORMAL HIGH (ref 70–99)
Potassium: 5.6 mmol/L — ABNORMAL HIGH (ref 3.5–5.1)
Sodium: 124 mmol/L — ABNORMAL LOW (ref 135–145)

## 2022-07-18 LAB — GLUCOSE, CAPILLARY
Glucose-Capillary: 121 mg/dL — ABNORMAL HIGH (ref 70–99)
Glucose-Capillary: 144 mg/dL — ABNORMAL HIGH (ref 70–99)
Glucose-Capillary: 189 mg/dL — ABNORMAL HIGH (ref 70–99)
Glucose-Capillary: 180 mg/dL — ABNORMAL HIGH (ref 70–99)

## 2022-07-18 MED ORDER — ALBUMIN HUMAN 25 % IV SOLN
25.0000 g | Freq: Once | INTRAVENOUS | Status: AC
  Administered 2022-07-18: 25 g via INTRAVENOUS
  Filled 2022-07-18: qty 100

## 2022-07-18 MED ORDER — FUROSEMIDE 10 MG/ML IJ SOLN
40.0000 mg | Freq: Every day | INTRAMUSCULAR | Status: DC
  Administered 2022-07-18 – 2022-07-19 (×2): 40 mg via INTRAVENOUS
  Filled 2022-07-18 (×2): qty 4

## 2022-07-18 MED ORDER — FUROSEMIDE 10 MG/ML IJ SOLN
20.0000 mg | Freq: Once | INTRAMUSCULAR | Status: AC
  Administered 2022-07-18: 20 mg via INTRAVENOUS
  Filled 2022-07-18: qty 4

## 2022-07-18 MED ORDER — HYDROXYZINE HCL 10 MG PO TABS
10.0000 mg | ORAL_TABLET | Freq: Three times a day (TID) | ORAL | Status: DC | PRN
  Administered 2022-07-18: 10 mg via ORAL
  Filled 2022-07-18: qty 1

## 2022-07-18 NOTE — Hospital Course (Signed)
89 year old female with past medical history of chronic hypoxemic respiratory failure on 4 L of oxygen, chronic diastolic heart failure, diabetes mellitus type 2, DVT on Xarelto admitted to [facility] long hospital on 07/04/2022 for right-sided chest discomfort, found to be sepsis due to pneumonia, started on Rocephin and Zithromax. On 07/15/2022 blood culture positive for MRSA, imaging showed epidural abscess with cord compression, disc edema. Neurosurgery was consulted, patient was transferred to [facility] for incision and drainage of T9, T10 and T11, surgical intervention with laminectomy.  Remains in the hospital.  Remains on IV antibiotics.  Pain control is major issue.  Very frail and debilitated. Remains in the hospital, episodic hypoxemia.  Hyponatremia and hyperkalemia.

## 2022-07-18 NOTE — Plan of Care (Signed)
  Problem: Education: Goal: Ability to describe self-care measures that may prevent or decrease complications (Diabetes Survival Skills Education) will improve Outcome: Not Progressing Goal: Individualized Educational Video(s) Outcome: Not Progressing   Problem: Coping: Goal: Ability to adjust to condition or change in health will improve Outcome: Not Progressing   Problem: Fluid Volume: Goal: Ability to maintain a balanced intake and output will improve Outcome: Not Progressing   Problem: Health Behavior/Discharge Planning: Goal: Ability to identify and utilize available resources and services will improve Outcome: Not Progressing Goal: Ability to manage health-related needs will improve Outcome: Not Progressing   Problem: Metabolic: Goal: Ability to maintain appropriate glucose levels will improve Outcome: Not Progressing   Problem: Nutritional: Goal: Maintenance of adequate nutrition will improve Outcome: Not Progressing Goal: Progress toward achieving an optimal weight will improve Outcome: Not Progressing   Problem: Skin Integrity: Goal: Risk for impaired skin integrity will decrease Outcome: Not Progressing   Problem: Tissue Perfusion: Goal: Adequacy of tissue perfusion will improve Outcome: Not Progressing   Problem: Education: Goal: Knowledge of General Education information will improve Description: Including pain rating scale, medication(s)/side effects and non-pharmacologic comfort measures Outcome: Not Progressing   Problem: Health Behavior/Discharge Planning: Goal: Ability to manage health-related needs will improve Outcome: Not Progressing   Problem: Clinical Measurements: Goal: Ability to maintain clinical measurements within normal limits will improve Outcome: Not Progressing Goal: Will remain free from infection Outcome: Not Progressing Goal: Diagnostic test results will improve Outcome: Not Progressing Goal: Respiratory complications will  improve Outcome: Not Progressing Goal: Cardiovascular complication will be avoided Outcome: Not Progressing   Problem: Activity: Goal: Risk for activity intolerance will decrease Outcome: Not Progressing   Problem: Nutrition: Goal: Adequate nutrition will be maintained Outcome: Not Progressing   Problem: Coping: Goal: Level of anxiety will decrease Outcome: Not Progressing   Problem: Elimination: Goal: Will not experience complications related to bowel motility Outcome: Not Progressing Goal: Will not experience complications related to urinary retention Outcome: Not Progressing   Problem: Pain Managment: Goal: General experience of comfort will improve Outcome: Not Progressing   Problem: Safety: Goal: Ability to remain free from injury will improve Outcome: Not Progressing   Problem: Skin Integrity: Goal: Risk for impaired skin integrity will decrease Outcome: Not Progressing   Problem: Education: Goal: Ability to verbalize activity precautions or restrictions will improve Outcome: Not Progressing Goal: Knowledge of the prescribed therapeutic regimen will improve Outcome: Not Progressing Goal: Understanding of discharge needs will improve Outcome: Not Progressing   Problem: Activity: Goal: Ability to avoid complications of mobility impairment will improve Outcome: Not Progressing Goal: Ability to tolerate increased activity will improve Outcome: Not Progressing Goal: Will remain free from falls Outcome: Not Progressing   Problem: Bowel/Gastric: Goal: Gastrointestinal status for postoperative course will improve Outcome: Not Progressing   Problem: Clinical Measurements: Goal: Ability to maintain clinical measurements within normal limits will improve Outcome: Not Progressing Goal: Postoperative complications will be avoided or minimized Outcome: Not Progressing Goal: Diagnostic test results will improve Outcome: Not Progressing   Problem: Pain  Management: Goal: Pain level will decrease Outcome: Not Progressing   Problem: Skin Integrity: Goal: Will show signs of wound healing Outcome: Not Progressing   Problem: Health Behavior/Discharge Planning: Goal: Identification of resources available to assist in meeting health care needs will improve Outcome: Not Progressing   Problem: Bladder/Genitourinary: Goal: Urinary functional status for postoperative course will improve Outcome: Not Progressing

## 2022-07-18 NOTE — Progress Notes (Signed)
TRH night cross cover note:   I was notified by RN that the patient's oxygen requirements have increased slightly over the course this evening shift, going from 8 L nonrebreather to 10 L nonrebreather, which the patient is currently maintaining oxygen saturations in the mid 90s.  RN conveys that patient's lung sounds are more wet now relative to that at the beginning of the shift.  In light of these changes, I have ordered a chest x-ray as well as a single dose of Lasix 20 mg IV to complement her existing order for Lasix 40 mg p.o. daily.     Babs Bertin, DO Hospitalist

## 2022-07-18 NOTE — Progress Notes (Signed)
Progress Note   Patient: [name] [id number] DOB: 22-Dec-1930 DOA: 07/17/2022     14 DOS: the patient was seen and examined on 07/18/2022   Brief hospital course: 89 year old female with past medical history of chronic hypoxemic respiratory failure on 4 L of oxygen, chronic diastolic heart failure, diabetes mellitus type 2, DVT on Xarelto admitted to [facility] long hospital on 07/04/2022 for right-sided chest discomfort, found to be sepsis due to pneumonia, started on Rocephin and Zithromax. On 07/15/2022 blood culture positive for MRSA, imaging showed epidural abscess with cord compression, disc edema. Neurosurgery was consulted, patient was transferred to [facility] for incision and drainage of T9, T10 and T11, surgical intervention with laminectomy.  Remains in the hospital.  Remains on IV antibiotics.  Pain control is major issue.  Very frail and debilitated. Remains in the hospital, episodic hypoxemia.  Hyponatremia and hyperkalemia.  Assessment and Plan: Sepsis due to MRSA bacteremia -Extensive abscess of thoracic lumbar spine, T9, T10, T11 -S/p laminectomy and I&D  -Blood cultures grew MRSA on 07/08/2022 -Repeat blood cultures on 07/06/2022 remain negative to date -ID consulted, TTE showed no evidence of vegetation.  No Wall motion normality, mild aortic stenosis. -ID recommends to hold off on TEE for now as repeat blood cultures are negative -Central line right IJ removed on 07/07/2022 -Vancomycin switched to daptomycin per ID   -As per ID recommendations PICC line placed 3/22.  Discharge antibiotic plans in place from ID team.   Delirium -Likely medication induced,  -she was started on Haldol and melatonin -Continue delirium precautions -Noted to have been improving   Chronic respiratory failure with hypoxemia and hypercarbia -COPD, not in exacerbation -Patient is currently on 2-3 L of oxygen by nasal cannula -Patient is on 4 L oxygen at home -overnight noted to have increased sob,  requiring more O2 -Was given 20mg  IV lasix -This AM, still sob, will continue IV lasix   Hypertension -Hold HCTZ due to hyponatremia, continue amlodipine, Imdur.  Stable.  Will start patient on low-dose Lasix to improve her potassium retention.   Hypervolemic hyponatremia -Sodium has been remaining 127 -126.   -serum osmolality: 271 -Continue fluid restriction to 1.2 L/day  -Grossly volume overloaded. Will start IV lasix   History of DVT -Patient is on Xarelto at home that was restarted.      Diabetes mellitus type 2 -Hemoglobin A1c greater than 10.0 in January of this year -CBG well-controlled -Continue very sensitive sliding scale with NovoLog -Started on Semglee 5 units subcu daily, avoid hypoglycemia. -Since patient does not have good appetite and intake, will keep on low-dose of insulin.   Hyperkalemia -Persistent.  Low potassium diet.  Lokelma every day.  Start Lasix.  Recheck tomorrow morning.      Subjective: Difficult to assess given menatation  Physical Exam: Vitals:   07/18/22 0857 07/18/22 1221 07/18/22 1230 07/18/22 1551  BP: 119/78 (!) 128/46  127/60  Pulse: 74 75  77  Resp: 20 20  (!) 22  Temp:  (!) 93.1 F (33.9 C) (!) 96.1 F (35.6 C) (!) 94.5 F (34.7 C)  TempSrc:  Axillary  Axillary  SpO2: 92% 94%  90%  Weight:      Height:       General exam: Awake, laying in bed, in nad Respiratory system: Normal respiratory effort, no wheezing Cardiovascular system: regular rate, s1, s2 Gastrointestinal system: Soft, nondistended, positive BS Central nervous system: CN2-12 grossly intact, strength intact Extremities: Perfused, no clubbing, BLE edema Skin: Normal skin  turgor, no notable skin lesions seen Psychiatry: Difficult to assess given mentation  Data Reviewed:  Labs reviewed: Na 124, K 5.6, Cr 1.38  Family Communication: Pt in room, family not at bedside  Disposition: Status is: Inpatient Remains inpatient appropriate because: Severity of  illness  Planned Discharge Destination: Home   Author: Marylu Lund, MD 07/18/2022 4:55 PM  For on call review [email or web address].

## 2022-07-18 NOTE — Progress Notes (Signed)
TRH night cross cover note:   I was notified by RN of patient's request for something for anxiety.  I subsequently placed order for as needed hydroxyzine for anxiety.     Babs Bertin, DO Hospitalist

## 2022-07-18 NOTE — Progress Notes (Signed)
Patient anxious during the night. Requested anxiety medication. Provider notified via page.

## 2022-07-18 NOTE — Progress Notes (Signed)
Pt was placed on 100% NRB mask on the previous shift.  Patient was transitioned to heated high-flow nasal cannua with fio2 .50 at 35 liters per minute.  02 saturation 95%--95%.  Will continue to monitor and adjust fio2 to maintain o2 saturations greater than 92%.

## 2022-07-18 NOTE — Progress Notes (Signed)
MD aware of patient temp, and patient refusal to keep bair hugger/ or blankets on despite several attempts at education with family present throughout day. Patient temp improves with rechecks and use of Bair hugger/ blankets but patient states "I am hot" and pulls them off.

## 2022-07-19 DIAGNOSIS — Z515 Encounter for palliative care: Secondary | ICD-10-CM | POA: Diagnosis not present

## 2022-07-19 DIAGNOSIS — J9611 Chronic respiratory failure with hypoxia: Secondary | ICD-10-CM | POA: Diagnosis not present

## 2022-07-19 DIAGNOSIS — J439 Emphysema, unspecified: Secondary | ICD-10-CM | POA: Diagnosis not present

## 2022-07-19 LAB — COMPREHENSIVE METABOLIC PANEL
ALT: 26 U/L (ref 0–44)
AST: 37 U/L (ref 15–41)
Albumin: 2.2 g/dL — ABNORMAL LOW (ref 3.5–5.0)
Alkaline Phosphatase: 75 U/L (ref 38–126)
Anion gap: 10 (ref 5–15)
BUN: 56 mg/dL — ABNORMAL HIGH (ref 8–23)
CO2: 26 mmol/L (ref 22–32)
Calcium: 8.4 mg/dL — ABNORMAL LOW (ref 8.9–10.3)
Chloride: 87 mmol/L — ABNORMAL LOW (ref 98–111)
Creatinine, Ser: 1.76 mg/dL — ABNORMAL HIGH (ref 0.44–1.00)
GFR, Estimated: 27 mL/min — ABNORMAL LOW (ref >60)
Glucose, Bld: 183 mg/dL — ABNORMAL HIGH (ref 70–99)
Potassium: 5.8 mmol/L — ABNORMAL HIGH (ref 3.5–5.1)
Sodium: 123 mmol/L — ABNORMAL LOW (ref 135–145)
Total Bilirubin: 0.6 mg/dL (ref 0.3–1.2)
Total Protein: 5.9 g/dL — ABNORMAL LOW (ref 6.5–8.1)

## 2022-07-19 LAB — GLUCOSE, CAPILLARY
Glucose-Capillary: 180 mg/dL — ABNORMAL HIGH (ref 70–99)
Glucose-Capillary: 202 mg/dL — ABNORMAL HIGH (ref 70–99)
Glucose-Capillary: 217 mg/dL — ABNORMAL HIGH (ref 70–99)
Glucose-Capillary: 156 mg/dL — ABNORMAL HIGH (ref 70–99)

## 2022-07-19 LAB — MAGNESIUM: Magnesium: 1.9 mg/dL (ref 1.7–2.4)

## 2022-07-19 LAB — CBC
HCT: 20.3 % — ABNORMAL LOW (ref 36.0–46.0)
Hemoglobin: 6.1 g/dL — CL (ref 12.0–15.0)
MCH: 28 pg (ref 26.0–34.0)
MCHC: 30 g/dL (ref 30.0–36.0)
MCV: 93.1 fL (ref 80.0–100.0)
Platelets: 136 10*3/uL — ABNORMAL LOW (ref 150–400)
RBC: 2.18 MIL/uL — ABNORMAL LOW (ref 3.87–5.11)
RDW: 15.9 % — ABNORMAL HIGH (ref 11.5–15.5)
WBC: 14.7 10*3/uL — ABNORMAL HIGH (ref 4.0–10.5)
nRBC: 0.2 % (ref 0.0–0.2)

## 2022-07-19 LAB — C-REACTIVE PROTEIN: CRP: 17.6 mg/dL — ABNORMAL HIGH (ref <1.0)

## 2022-07-19 LAB — PREPARE RBC (CROSSMATCH)

## 2022-07-19 MED ORDER — HYDROMORPHONE HCL 1 MG/ML IJ SOLN: 1.0000 mg | Freq: Once | INTRAMUSCULAR | Status: DC

## 2022-07-19 MED ORDER — HYDROMORPHONE BOLUS VIA INFUSION
0.5000 mg | INTRAVENOUS | Status: DC | PRN
  Administered 2022-07-19 (×3): 0.5 mg via INTRAVENOUS

## 2022-07-19 MED ORDER — HYDROMORPHONE HCL-NACL 50-0.9 MG/50ML-% IV SOLN
0.5000 mg/h | INTRAVENOUS | Status: DC
  Administered 2022-07-19: 0.5 mg/h via INTRAVENOUS
  Filled 2022-07-19: qty 50

## 2022-07-19 MED ORDER — LORAZEPAM 2 MG/ML IJ SOLN: 0.5000 mg | INTRAMUSCULAR | Status: DC | PRN

## 2022-07-19 MED ORDER — POLYVINYL ALCOHOL 1.4 % OP SOLN: 1.0000 [drp] | Freq: Four times a day (QID) | OPHTHALMIC | Status: DC | PRN

## 2022-07-19 MED ORDER — FUROSEMIDE 10 MG/ML IJ SOLN: 80.0000 mg | Freq: Two times a day (BID) | INTRAMUSCULAR | Status: DC

## 2022-07-19 MED ORDER — ALBUMIN HUMAN 25 % IV SOLN
25.0000 g | Freq: Once | INTRAVENOUS | Status: AC
  Administered 2022-07-19: 25 g via INTRAVENOUS
  Filled 2022-07-19: qty 100

## 2022-07-19 MED ORDER — SODIUM CHLORIDE 0.9% IV SOLUTION: Freq: Once | INTRAVENOUS | Status: DC

## 2022-07-19 MED ORDER — LORAZEPAM 2 MG/ML IJ SOLN
1.0000 mg | Freq: Four times a day (QID) | INTRAMUSCULAR | Status: DC
  Administered 2022-07-19: 1 mg via INTRAVENOUS
  Filled 2022-07-19: qty 1

## 2022-07-19 MED ORDER — FUROSEMIDE 10 MG/ML IJ SOLN
40.0000 mg | Freq: Once | INTRAMUSCULAR | Status: AC
  Administered 2022-07-19: 40 mg via INTRAVENOUS
  Filled 2022-07-19: qty 4

## 2022-07-19 MED ORDER — SODIUM CHLORIDE 0.9 % IV SOLN: 600.0000 mg | INTRAVENOUS | Status: DC

## 2022-07-19 MED ORDER — GLYCOPYRROLATE 0.2 MG/ML IJ SOLN: 0.4000 mg | INTRAMUSCULAR | Status: DC | PRN

## 2022-07-19 MED ORDER — FUROSEMIDE 10 MG/ML IJ SOLN: 60.0000 mg | Freq: Two times a day (BID) | INTRAMUSCULAR | Status: DC

## 2022-07-19 MED ORDER — BIOTENE DRY MOUTH MT LIQD: 15.0000 mL | OROMUCOSAL | Status: DC | PRN

## 2022-07-20 LAB — TYPE AND SCREEN
ABO/RH(D): O POS
Antibody Screen: NEGATIVE
Unit division: 0

## 2022-07-20 LAB — BPAM RBC
Blood Product Expiration Date: 202403272359
ISSUE DATE / TIME: 202403241616
Unit Type and Rh: 5100

## 2022-07-27 NOTE — Progress Notes (Addendum)
Palliative Medicine Inpatient Follow Up Note HPI: 89 year old female with past medical history of chronic hypoxemic respiratory failure on 4 L of oxygen, chronic diastolic heart failure, diabetes mellitus type 2, DVT on Xarelto admitted to [facility] long hospital on 07/04/2022 for right-sided chest discomfort, found to be sepsis due to pneumonia, started on Rocephin and Zithromax. On 07/15/2022 blood culture positive for MRSA, imaging showed epidural abscess with cord compression, disc edema. Neurosurgery was consulted, patient was transferred to [facility] for incision and drainage of T9, T10 and T11, surgical intervention with laminectomy.   Palliative care is involved for ongoing [place] conversations.   Today's Discussion 07/01/2022  *Please note that this is a verbal dictation therefore any spelling or grammatical errors are due to the "[place] One" system interpretation.  Chart reviewed inclusive of vital signs, progress notes, laboratory results, and diagnostic images.   Asked by patients RN to see Lior due to an acute medical decline and worsening hypoxia causing the need for maximal HFNC as well as a NRBFM.  On assessment, Chloris is breathing rapidly and disoriented.   I met at bedside with patients sons and DIL to discuss patients present clinical deterioration. We met in the conference room.  Discussions were held regarding patients notable volume overload causing her to have a more difficult time breathing. It was also reviewed that she is anemic. In addition her kidneys are functioning poorly. I was open and honest with them that there appear to be multiple organ systems that are in disarray at this time.   Created space and opportunity for patients sons to explore thoughts feelings and fears regarding Arnell's current medical situation.   WE discussed that Makay may do alright with getting additional blood and diuresis but it may also make her breathing and kidney function worse  given how profoundly overloaded she already is.   We talked about meeting as a family to discuss the options of continuing present aggressive measures or alternatively changing the focus of care to comfort measures.   Total Time: 60 ____________________________________ Addendum:  I was able to meet with four out of five of patients children and patients spouse, Richardson Landry and granddaughter, Marijo File on speaker-phone. We reviewed patients past medical history inclusive of COPD, CHF. DMII, recent epidural abscess, and PNA.   We discussed what a strong, special, resilient woman Shandi has been throughout the years. We reviewed that she has taken a sharp decline from January 2nd until now. We reviewed that patients family is so thankful they got the last two weeks with her as they were not sure this would even happen after her surgeries.   I posed the question of how aggressive we want to be with Everlene Farrier at this time given the constellation of events she has endured as of recently. I shared the risks associated with blood transfusion.   We talked about the possibility of transition to comfort measures in house and what that would entail inclusive of medications to control pain, dyspnea, agitation, nausea, itching, and hiccups.  I shared in the case of Lizanne she would require a dilaudid gtt for her dyspnea.  Patients children were tearful and at this time half seem to want to move forward with giving blood products while the other half are at a place of comfort with allowing Mirenda to be made comfortable. I shared that at this moment we have some time as patients husband still wants to come to bedside and her oldest daughter, Butch Penny has not been in  touch with family as of yet.  We determined to hold of on giving the blood for the time being until the additional two family members are able to weigh in.   _________________________________________ Addendum #2  I met at bedside with Dr. Wyline Copas as patients  spouse had arrived. He provided a clinical update and reviewed that patient has deteriorated in the last 24 hours though everything that can be done on the floor is being done to help her at this time. Reviewed the pros and cons of blood transfusions. At this time patients oldest son wants to "try" and the family is in agreement with this.  _________________________________________ Addendum #3  Per RN, Claiborne Billings patient is noted to be hypothermic prior to blood transfusions. Patients family informed. I was able to go back to bedside and review the concern for sepsis. Patients family share the desire to keep her comfortable and allow her to pass peacefully not gasping for air.   Comfort orders entered after collaboration with patients RN.  Dr. Wyline Copas informed via secure chat.  Anticipate death w/in hours.  Additional Time: 40  Questions and concerns addressed/Palliative Support Provided.   Objective Assessment: Vital Signs Vitals:   07/20/2022 1100 07/11/2022 1226  BP: (!) 108/49 (!) 108/49  Pulse: 63 64  Resp: 18 (!) 23  Temp:    SpO2: 90% 94%    Intake/Output Summary (Last 24 hours) at 07/02/2022 1441 Last data filed at 07/01/2022 1240 Gross per 24 hour  Intake 436 ml  Output --  Net 436 ml   Last Weight  Most recent update: 07/18/2022  7:07 AM    Weight  103 kg (227 lb 1.2 oz)            Gen: Elderly acutely ill caucasian F in severe distress HEENT: Dry mucous membranes CV: Regular rate and rhythm  PULM:  On 45LPM/100% FiO2 HFNC and NRBFM ABD: soft/nontender  EXT: Generalized edema  Neuro: Aware of self  SUMMARY OF RECOMMENDATIONS   DNAR/DNI  End of life measures  Dilaudid gtt ordered  Ativan 1g IVP Q6H ATC  Comfort medications per MAR  Wean O2 to comfort  Unrestricted visitation  Anticipate death w/in hours once O2 is removed  Plan for ongoing palliative support  Time Spent: 120 Billing based on MDM: High   ______________________________________________________________________________________ [place] Team Team Cell Phone: [phone] Please utilize secure chat with additional questions, if there is no response within 30 minutes please call the above phone number  Palliative Medicine Team providers are available by phone from 7am to 7pm daily and can be reached through the team cell phone.  Should this patient require assistance outside of these hours, please call the patient's attending physician.

## 2022-07-27 NOTE — Progress Notes (Addendum)
Progress Note   Patient: [name] [id number] DOB: [birth year] DOA: 07/26/2022     15 DOS: the patient was seen and examined on 07/25/2022   Brief hospital course: 89 year old female with past medical history of chronic hypoxemic respiratory failure on 4 L of oxygen, chronic diastolic heart failure, diabetes mellitus type 2, DVT on Xarelto admitted to [facility] on 07/04/2022 for right-sided chest discomfort, found to be sepsis due to pneumonia, started on Rocephin and Zithromax. On 07/15/2022 blood culture positive for MRSA, imaging showed epidural abscess with cord compression, disc edema. Neurosurgery was consulted, patient was transferred to [facility] for incision and drainage of T9, T10 and T11, surgical intervention with laminectomy.  Remains in the hospital.  Remains on IV antibiotics.  Pain control is major issue.  Very frail and debilitated. Remains in the hospital, episodic hypoxemia.  Hyponatremia and hyperkalemia.  Assessment and Plan: Sepsis due to MRSA bacteremia -Extensive abscess of thoracic lumbar spine, T9, T10, T11 -S/p laminectomy and I&D  -Blood cultures grew MRSA on 07/02/2022 -Repeat blood cultures on 07/06/2022 remain negative to date -ID consulted, TTE showed no evidence of vegetation.  No Wall motion normality, mild aortic stenosis. -ID recommended to hold off on TEE for now as repeat blood cultures are negative -Central line right IJ removed on 07/07/2022 -Vancomycin switched to daptomycin per ID   -As per ID recommendations PICC line placed 3/22.  Discharge antibiotic plans in place from ID team.   Delirium -Likely medication induced,  -she was started on Haldol and melatonin   Chronic respiratory failure with hypoxemia and hypercarbia -Patient is on 4 L oxygen at home -now noted to have increased sob, requiring high flow O2 -Clinically volume overloaded, increased lasix to 60mg  BID lasix   Hypertension -Hold HCTZ due to hyponatremia, continue  amlodipine, Imdur.  -continued on scheduled IV lasix   Hypervolemic hyponatremia -serum osmolality: 271 -Grossly volume overloaded with pitting edema -Increased lasix to 60mg  IV BID with IV albumin   History of DVT -Patient is on Xarelto at home that was restarted.  -No obvious evidence of bleeding     Diabetes mellitus type 2 -Hemoglobin A1c greater than 10.0 in January of this year -CBG well-controlled -Continue very sensitive sliding scale with NovoLog -Started on Semglee 5 units subcu daily, avoid hypoglycemia. -Since patient does not have good appetite and intake, will keep on low-dose of insulin.   Hyperkalemia -Persistent.  Low potassium diet.  Lokelma every day.   -Continued on IV laisx  Normocytic Anemia -Hgb down gto 6.1 -Ordered 1 unit PRBC transfusion, discussed with pt's son at bedside who agrees -Palliative Care documentation noted, decision was made to hold off transfusion for the time being   ADDENDUM Later in day, met with rest of family and with Palliative Care at bedside. Pt is rapidly declining. Initially decision was made to attempt blood transfusion, however pt was noted to be hypothermic. Overall prognosis is grim. Pt was subsequently transitioned to full comfort only    Subjective: Unable to assess given acute illness  Physical Exam: Vitals:   07/13/2022 0700 07/17/2022 0837 07/16/2022 1100 07/01/2022 1226  BP: 130/65 130/68 (!) 108/49 (!) 108/49  Pulse: 70 68 63 64  Resp: 18 (!) 22 18 (!) 23  Temp:      TempSrc: Axillary     SpO2: 99% 94% 90% 94%  Weight:      Height:       General exam: Conversant, in no acute distress Respiratory system:  normal chest rise, clear, no audible wheezing Cardiovascular system: regular rhythm, s1-s2 Gastrointestinal system: Nondistended, nontender, pos BS Central nervous system: No seizures, no tremors Extremities: No cyanosis, no joint deformities, BLEpitting edema Skin: No rashes, no pallor Psychiatry: Affect normal  // no auditory hallucinations   Data Reviewed:  Labs reviewed: Na 123, K 5.8, Cr 1.76  Family Communication: Pt in room, family not at bedside  Disposition: Status is: Inpatient Remains inpatient appropriate because: Severity of illness  Planned Discharge Destination: Home   Author: Marylu Lund, MD 07/18/2022 3:04 PM  For on call review [email or web address].

## 2022-07-27 NOTE — Progress Notes (Signed)
CHG Bath given to this patient.

## 2022-07-27 NOTE — Progress Notes (Signed)
Patient had a Dilaudid drip d/c, 45 ml were wasted with this RN and charge Artist.

## 2022-07-27 NOTE — Progress Notes (Signed)
Patient started struggling with breathing, using accessory muscles and stating she felt SOB. Called respiratory, patient is a mouth breather and was placed on non rebreather. Notified MD.

## 2022-07-27 NOTE — Progress Notes (Signed)
Patient passed away at [place] per family at bedside. Dr. Velia Meyer was paged to be notified.

## 2022-07-27 NOTE — Progress Notes (Signed)
IV was paged. Patient have a Picc line that needs to be removed, patient passed away.

## 2022-07-27 NOTE — Discharge Summary (Signed)
Death Summary  [name] [id number] DOB: [birth year] DOA: 07-24-22  PCP: [name], DO  Admit date: 07/24/2022 Date of Death: 2022/08/10 Time of Death: 08/15/57 Notification: [name], DO notified of death of 08/10/22   History of present illness:  89 year old female with past medical history of chronic hypoxemic respiratory failure on 4 L of oxygen, chronic diastolic heart failure, diabetes mellitus type 2, DVT on Xarelto admitted to [facility] on 07/04/2022 for right-sided chest discomfort, found to be sepsis due to pneumonia, started on Rocephin and Zithromax. On 07/15/2022 blood culture positive for MRSA, imaging showed epidural abscess with cord compression, disc edema. Neurosurgery was consulted, patient was transferred to [facility] for incision and drainage of T9, T10 and T11, surgical intervention with laminectomy.  Remains in the hospital.  Remains on IV antibiotics.  Pain control is major issue.  Very frail and debilitated. Remains in the hospital, episodic hypoxemia.  Hyponatremia and hyperkalemia.   Final Diagnoses:  Sepsis due to MRSA bacteremia present on admit -Extensive abscess of thoracic lumbar spine, T9, T10, T11 -S/p laminectomy and I&D  -Blood cultures grew MRSA on 07/24/22 -Repeat blood cultures on 07/06/2022 remain negative to date -ID consulted, TTE showed no evidence of vegetation.  No Wall motion normality, mild aortic stenosis. -ID recommended to hold off on TEE for now as repeat blood cultures are negative -Central line right IJ removed on 07/07/2022 -Vancomycin switched to daptomycin per ID -on 3/23, patient's condition worsened, see below -Appreciate assistance by Palliative Care, pt's wishes were noted to be DNR with focus on comfort ultmately   Delirium -initially suspected secondary to medications -she was started on Haldol and melatonin   Chronic respiratory failure with hypoxemia and hypercarbia -Pt was noted to be on 4 L oxygen at  home -towards the end of patient's hospital stay, patient's O2 requirements and respiratory effort worsened, requiring up to high flow O2 -Pt was noted to be clinically volume overloaded, increased lasix to 60mg  BID lasix   Hypertension -HCTZ was stopped due to hyponatremia -amlodipine, Imdur was continued -patient was later started on scheduled IV lasix per below   Hypervolemic hyponatremia -serum osmolality: 271 -Grossly volume overloaded with pitting edema -Increased lasix to 60mg  IV BID with IV albumin with no significant improvement   History of DVT -Patient was on Xarelto at home that was restarted.  -There was no obvious evidence of bleeding noted   Diabetes mellitus type 2 -Hemoglobin A1c greater than 10.0 in January of this year -patient was continued on insulin with sliding scale regimen   Hyperkalemia -patient was continued on daily lokelma -Continued on IV laisx   Normocytic Anemia -Hgb did go down to 6.1 -Ordered 1 unit PRBC transfusion, however given patient's rapid decline and grim prognosis, decision was made to transition patient to comfort care and blood was not infused   DNR  End of Life -Patient's wishes were ultimately noted to be DNR with comfort measures only. Patient was pronounced at [facility] on 08-09-2022  The results of significant diagnostics from this hospitalization (including imaging, microbiology, ancillary and laboratory) are listed below for reference.    Significant Diagnostic Studies: DG Chest Port 1 View  Result Date: 07/18/2022 CLINICAL DATA:  Hypoxia EXAM: PORTABLE CHEST - 1 VIEW COMPARISON:  the previous day's study FINDINGS: Patchy airspace opacities left greater than right, marginally improved in the right lower lung but increased in the right upper lung. Heart size and mediastinal contours are within normal limits. Aortic Atherosclerosis (ICD10-170.0).  Probable left pleural effusion. Visualized bones unremarkable. IMPRESSION: Bilateral airspace  disease, left greater than right as before. Stable small left effusion. Electronically Signed   By: Lucrezia Europe M.D.   On: 07/18/2022 07:54   DG CHEST PORT 1 VIEW  Result Date: 07/17/2022 CLINICAL DATA:  PICC line placement EXAM: PORTABLE CHEST 1 VIEW COMPARISON:  07/07/2022 FINDINGS: The right internal jugular central venous catheter is been removed. A left-sided PICC line is present with tip projecting over the lower SVC. No pneumothorax or complicating feature. Increased airspace opacity in both mid lungs and lung bases. Increased density in the right perihilar and suprahilar region potentially from localized pleural fluid or consolidation. Indistinct costophrenic angles bilaterally. Atherosclerotic calcification of the aortic arch. Left cardiac border is obscured. IMPRESSION: 1. Left-sided PICC line tip: Lower SVC. No pneumothorax or complicating feature. 2. Increased airspace opacity in both mid lungs and lung bases. 3. Increased density in the right perihilar and suprahilar region potentially from localized pleural fluid or consolidation. 4. Indistinct costophrenic angles bilaterally. Electronically Signed   By: Van Clines M.D.   On: 07/17/2022 12:48   [place] EKG SITE RITE  Result Date: 07/16/2022 If Site Rite image not attached, placement could not be confirmed due to current cardiac rhythm.  DG Chest 1 View  Result Date: 07/07/2022 CLINICAL DATA:  Shortness of breath EXAM: CHEST  1 VIEW COMPARISON:  07/04/2022 FINDINGS: Central line tip in the SVC above the right atrium. Increased retrocardiac density on the left consistent with left lower lobe atelectasis or infiltrate. Drainage catheter overlying the midportion of the thoracic spine, not well evaluated. IMPRESSION: Increased retrocardiac density on the left consistent with left lower lobe atelectasis or infiltrate. Electronically Signed   By: Nelson Chimes M.D.   On: 07/07/2022 14:43   ECHOCARDIOGRAM COMPLETE  Result Date: 07/06/2022     ECHOCARDIOGRAM REPORT   Patient Name:   [name] Date of Exam: 07/06/2022 Medical Rec #:  [id number]       Height:       64.0 in Accession #:    [id number]      Weight:       198.0 lb Date of Birth:  01-Mar-1931       BSA:          1.948 m Patient Age:    61 years        BP:           115/34 mmHg Patient Gender: F               HR:           88 bpm. Exam Location:  Inpatient Procedure: 2D Echo, Cardiac Doppler, Color Doppler and Intracardiac            Opacification Agent Indications:    Bacteremia  History:        Patient has prior history of Echocardiogram examinations, most                 recent 10/19/2019. CHF, COPD; Risk Factors:Hypertension,                 Dyslipidemia and Diabetes.  Sonographer:    Eartha Inch Referring Phys: [id number] CHAND  Sonographer Comments: Technically difficult study due to poor echo windows. Image acquisition challenging due to patient body habitus and Image acquisition challenging due to respiratory motion. IMPRESSIONS  1. Left ventricular ejection fraction, by estimation, is 70 to 75%. The left ventricle has hyperdynamic function.  The left ventricle has no regional wall motion abnormalities. There is mild asymmetric left ventricular hypertrophy of the basal-septal segment. Left ventricular diastolic parameters are indeterminate.  2. Right ventricular systolic function is normal. The right ventricular size is normal.  3. Left atrial size was mild to moderately dilated.  4. Right atrial size was mild to moderately dilated.  5. The mitral valve is normal in structure. No evidence of mitral valve regurgitation. No evidence of mitral stenosis.  6. The aortic valve is tricuspid. There is moderate calcification of the aortic valve. Aortic valve regurgitation is trivial. Mild aortic valve stenosis. Aortic valve area, by VTI measures 1.43 cm. Aortic valve mean gradient measures 17.0 mmHg. Aortic valve Vmax measures 2.73 m/s.  7. The inferior vena cava is normal in size with  greater than 50% respiratory variability, suggesting right atrial pressure of 3 mmHg.  8. No TTE evidence of endocarditis. If clinical suspicion is high would recommend TEE. FINDINGS  Left Ventricle: Left ventricular ejection fraction, by estimation, is 70 to 75%. The left ventricle has hyperdynamic function. The left ventricle has no regional wall motion abnormalities. Definity contrast agent was given IV to delineate the left ventricular endocardial borders. The left ventricular internal cavity size was normal in size. There is mild asymmetric left ventricular hypertrophy of the basal-septal segment. Left ventricular diastolic function could not be evaluated due to atrial fibrillation. Left ventricular diastolic parameters are indeterminate. Right Ventricle: The right ventricular size is normal. No increase in right ventricular wall thickness. Right ventricular systolic function is normal. Left Atrium: Left atrial size was mild to moderately dilated. Right Atrium: Right atrial size was mild to moderately dilated. Pericardium: There is no evidence of pericardial effusion. Mitral Valve: The mitral valve is normal in structure. Mild mitral annular calcification. No evidence of mitral valve regurgitation. No evidence of mitral valve stenosis. Tricuspid Valve: The tricuspid valve is normal in structure. Tricuspid valve regurgitation is trivial. No evidence of tricuspid stenosis. Aortic Valve: The aortic valve is tricuspid. There is moderate calcification of the aortic valve. Aortic valve regurgitation is trivial. Mild aortic stenosis is present. Aortic valve mean gradient measures 17.0 mmHg. Aortic valve peak gradient measures 29.8 mmHg. Aortic valve area, by VTI measures 1.43 cm. Pulmonic Valve: The pulmonic valve was normal in structure. Pulmonic valve regurgitation is trivial. No evidence of pulmonic stenosis. Aorta: The aortic root is normal in size and structure. Venous: The inferior vena cava is normal in size  with greater than 50% respiratory variability, suggesting right atrial pressure of 3 mmHg. IAS/Shunts: No atrial level shunt detected by color flow Doppler.  LEFT VENTRICLE PLAX 2D LVIDd:         3.90 cm     Diastology LVIDs:         1.80 cm     LV e' medial:    10.00 cm/s LV PW:         1.20 cm     LV E/e' medial:  11.1 LV IVS:        1.20 cm     LV e' lateral:   7.07 cm/s LVOT diam:     1.80 cm     LV E/e' lateral: 15.7 LV SV:         68 LV SV Index:   35 LVOT Area:     2.54 cm  LV Volumes (MOD) LV vol d, MOD A2C: 69.4 ml LV vol d, MOD A4C: 63.9 ml LV vol s, MOD A2C: 11.1 ml  LV vol s, MOD A4C: 6.7 ml LV SV MOD A2C:     58.3 ml LV SV MOD A4C:     63.9 ml LV SV MOD BP:      58.3 ml RIGHT VENTRICLE             IVC RV S prime:     13.20 cm/s  IVC diam: 1.30 cm TAPSE (M-mode): 1.8 cm LEFT ATRIUM             Index        RIGHT ATRIUM           Index LA diam:        2.90 cm 1.49 cm/m   RA Area:     18.00 cm LA Vol (A2C):   52.4 ml 26.90 ml/m  RA Volume:   50.00 ml  25.67 ml/m LA Vol (A4C):   59.5 ml 30.55 ml/m LA Biplane Vol: 56.9 ml 29.21 ml/m  AORTIC VALVE AV Area (Vmax):    1.15 cm AV Area (Vmean):   1.19 cm AV Area (VTI):     1.43 cm AV Vmax:           273.00 cm/s AV Vmean:          197.000 cm/s AV VTI:            0.474 m AV Peak Grad:      29.8 mmHg AV Mean Grad:      17.0 mmHg LVOT Vmax:         123.00 cm/s LVOT Vmean:        92.500 cm/s LVOT VTI:          0.266 m LVOT/AV VTI ratio: 0.56  AORTA Ao Root diam: 2.80 cm Ao Asc diam:  3.00 cm MITRAL VALVE MV Area (PHT): 3.37 cm     SHUNTS MV Decel Time: 225 msec     Systemic VTI:  0.27 m MV E velocity: 111.00 cm/s  Systemic Diam: 1.80 cm MV A velocity: 99.70 cm/s MV E/A ratio:  1.11 Glori Bickers MD Electronically signed by Glori Bickers MD Signature Date/Time: 07/06/2022/3:44:20 PM    Final    DG Thoracic Spine 1 View  Result Date: 07/04/2022 CLINICAL DATA:  [id number] with elective surgery performed for extensive epidural abscess from C7-T1 through  L1-2 with spinal cord compression. EXAM: OPERATIVE THORACIC SPINE 1 VIEW COMPARISON:  Thoracic and lumbar spine MRI yesterday. FINDINGS: Single lateral view is obtained from the mid T10 level through the sacrum. A metallic surgical instrument noted dorsal to T11. Trace discogenic retrolisthesis is again seen at L1-2 with otherwise unremarkable lateral alignment. There is moderate thoracolumbar spondylosis. Ankylosis across the L5-S1 disc is again shown. The discs show degenerative collapse from T11 down, as before. No endplate destructive changes are seen. All imaged vertebrae are normal in heights. There have been no significant interval changes radiographically. IMPRESSION: 1. Metallic surgical instrument dorsal to T11. No significant interval changes radiographically. 2. Thoracolumbar spondylosis and multilevel DDD without evidence of compression fractures or visible endplate destructive change. 3. Ankylosis at L5-S1. Electronically Signed   By: Telford Nab M.D.   On: 07/06/2022 21:08   DG Chest Port 1 View  Result Date: 06/26/2022 CLINICAL DATA:  Shortness of breath EXAM: PORTABLE CHEST 1 VIEW COMPARISON:  Chest radiograph 07/04/2022 FINDINGS: Central venous catheter tip projects over the superior vena cava. Stable cardiac and mediastinal contours. Aortic atherosclerosis. New heterogeneous opacities right mid lung. Bilateral interstitial pulmonary opacities. No pleural effusion  or pneumothorax. Thoracic spine degenerative changes. IMPRESSION: New heterogeneous opacities right mid lung may represent atelectasis or infection. Recommend short-term follow-up chest radiograph to ensure resolution and exclude underlying nodule. Interstitial opacities may represent vascular redistribution. Superimposed mild edema not excluded. Electronically Signed   By: Lovey Newcomer M.D.   On: 07/08/2022 16:08   MR THORACIC SPINE W WO CONTRAST  Result Date: 07/20/2022 CLINICAL DATA:  Low back pain, infection suspected.  Intermittent back pain with MRSA bacteremia. EXAM: MRI THORACIC AND LUMBAR SPINE WITHOUT AND WITH CONTRAST TECHNIQUE: Multiplanar and multiecho pulse sequences of the thoracic and lumbar spine were obtained without and with intravenous contrast. CONTRAST:  35mL GADAVIST GADOBUTROL 1 MMOL/ML IV SOLN COMPARISON:  CTA chest 06/30/2022.  CT abdomen/pelvis 07/04/2022. FINDINGS: MRI THORACIC SPINE FINDINGS Alignment:  Normal. Vertebrae: Disc edema with adjacent endplate marrow edema and enhancement at T6-7, T8-9, T10-11 and T11-12. However, there is little correlate on the recent CTA chest. Cord: Longitudinally extensive epidural abscess extending from at least C7-T1 caudally through the upper lumbar spine. Associated areas of leptomeningeal enhancement, consistent with superimposed arachnoiditis. Based on the localizer images, suspect extension into the cervical spinal canal as well. Paraspinal and other soft tissues: Atelectasis or consolidation in dependent portions of the lungs in dependent portions of the lungs. Disc levels: Multifocal moderate to severe compression of the spinal cord at T8-9, T10-11, T11-12. MRI LUMBAR SPINE FINDINGS Segmentation:  Standard. Alignment:  Normal. Vertebrae: Modic type 2 degenerative endplate marrow signal changes at L1-2, L2-3 and L4-5. No evidence of discitis-osteomyelitis in the lumbar spine. Conus medullaris: Extends to the L1-2 level. Clumping of the cauda equina nerve roots and conus with associated leptomeningeal enhancement, consistent with arachnoiditis. Paraspinal and other soft tissues: No acute findings. Disc levels: Mild spinal canal stenosis at L1-2 secondary to facet arthropathy and central disc-osteophyte complex. IMPRESSION: 1. Longitudinally extensive epidural abscess extending from at least C7-T1 caudally through the L1-2. Based on the localizer images, suspect involvement of the cervical spinal canal as well. 2. Multifocal moderate to severe compression of the spinal  cord at T8-9, T10-11, T11-12. 3. Disc edema with adjacent endplate marrow edema and enhancement at T6-7, T8-9, T10-11 and T11-12. Findings are suspicious for multifocal discitis-osteomyelitis, though there is little correlate on the recent CTA chest. 4. Clumping of the cauda equina nerve roots and conus with associated leptomeningeal enhancement, consistent with superimposed arachnoiditis. Critical Value/emergent results were called by telephone at the time of interpretation on 07/04/2022 at 12:16 pm to provider [facility] , who verbally acknowledged these results. Electronically Signed   By: Emmit Alexanders M.D.   On: 07/24/2022 12:25   MR Lumbar Spine W Wo Contrast  Result Date: 07/25/2022 CLINICAL DATA:  Low back pain, infection suspected. Intermittent back pain with MRSA bacteremia. EXAM: MRI THORACIC AND LUMBAR SPINE WITHOUT AND WITH CONTRAST TECHNIQUE: Multiplanar and multiecho pulse sequences of the thoracic and lumbar spine were obtained without and with intravenous contrast. CONTRAST:  9mL GADAVIST GADOBUTROL 1 MMOL/ML IV SOLN COMPARISON:  CTA chest 06/30/2022.  CT abdomen/pelvis 07/04/2022. FINDINGS: MRI THORACIC SPINE FINDINGS Alignment:  Normal. Vertebrae: Disc edema with adjacent endplate marrow edema and enhancement at T6-7, T8-9, T10-11 and T11-12. However, there is little correlate on the recent CTA chest. Cord: Longitudinally extensive epidural abscess extending from at least C7-T1 caudally through the upper lumbar spine. Associated areas of leptomeningeal enhancement, consistent with superimposed arachnoiditis. Based on the localizer images, suspect extension into the cervical spinal canal as well. Paraspinal and other  soft tissues: Atelectasis or consolidation in dependent portions of the lungs in dependent portions of the lungs. Disc levels: Multifocal moderate to severe compression of the spinal cord at T8-9, T10-11, T11-12. MRI LUMBAR SPINE FINDINGS Segmentation:  Standard. Alignment:   Normal. Vertebrae: Modic type 2 degenerative endplate marrow signal changes at L1-2, L2-3 and L4-5. No evidence of discitis-osteomyelitis in the lumbar spine. Conus medullaris: Extends to the L1-2 level. Clumping of the cauda equina nerve roots and conus with associated leptomeningeal enhancement, consistent with arachnoiditis. Paraspinal and other soft tissues: No acute findings. Disc levels: Mild spinal canal stenosis at L1-2 secondary to facet arthropathy and central disc-osteophyte complex. IMPRESSION: 1. Longitudinally extensive epidural abscess extending from at least C7-T1 caudally through the L1-2. Based on the localizer images, suspect involvement of the cervical spinal canal as well. 2. Multifocal moderate to severe compression of the spinal cord at T8-9, T10-11, T11-12. 3. Disc edema with adjacent endplate marrow edema and enhancement at T6-7, T8-9, T10-11 and T11-12. Findings are suspicious for multifocal discitis-osteomyelitis, though there is little correlate on the recent CTA chest. 4. Clumping of the cauda equina nerve roots and conus with associated leptomeningeal enhancement, consistent with superimposed arachnoiditis. Critical Value/emergent results were called by telephone at the time of interpretation on 07/07/2022 at 12:16 pm to provider [facility] , who verbally acknowledged these results. Electronically Signed   By: Emmit Alexanders M.D.   On: 07/23/2022 12:25   DG Chest Port 1 View  Result Date: 07/04/2022 CLINICAL DATA:  Constipation and cough EXAM: PORTABLE CHEST 1 VIEW COMPARISON:  CTA chest 06/30/2022 FINDINGS: Stable cardiomegaly. Aortic atherosclerotic calcification. Pulmonary vascular congestion. Unchanged mild left basilar airspace opacities. No definite pleural effusion. No pneumothorax. No acute osseous abnormality. Postoperative changes right humeral head. IMPRESSION: 1. Cardiomegaly with pulmonary vascular congestion. 2. Unchanged mild left basilar atelectasis/scarring.  Electronically Signed   By: Placido Sou M.D.   On: 07/04/2022 03:28   CT ABDOMEN PELVIS W CONTRAST  Result Date: 07/04/2022 CLINICAL DATA:  Abdominal pain. EXAM: CT ABDOMEN AND PELVIS WITH CONTRAST TECHNIQUE: Multidetector CT imaging of the abdomen and pelvis was performed using the standard protocol following bolus administration of intravenous contrast. RADIATION DOSE REDUCTION: This exam was performed according to the departmental dose-optimization program which includes automated exposure control, adjustment of the mA and/or kV according to patient size and/or use of iterative reconstruction technique. CONTRAST:  12mL OMNIPAQUE IOHEXOL 300 MG/ML  SOLN COMPARISON:  CT abdomen pelvis dated 11/14/2010. FINDINGS: Lower chest: Bibasilar subpleural streaky and reticular atelectasis or infiltrate. Aspiration is not excluded clinical correlation is recommended. There is coronary vascular calcification. No intra-abdominal free air or free fluid. Hepatobiliary: The liver is unremarkable. There is mild biliary dilatation, post cholecystectomy. No retained calcified stone noted in the central CBD. Pancreas: Unremarkable. No pancreatic ductal dilatation or surrounding inflammatory changes. Spleen: There is a 3.5 cm cyst in the spleen. The spleen is otherwise unremarkable. Adrenals/Urinary Tract: There adrenal glands are unremarkable. There is a 3 cm left renal inferior pole cyst. There is no hydronephrosis on either side. There is symmetric enhancement and excretion of contrast by both kidneys. The visualized ureters and urinary bladder appear unremarkable. Stomach/Bowel: There is sigmoid diverticulosis without active inflammatory changes. There is moderate stool throughout the colon. There is no bowel obstruction or active inflammation. The appendix is normal. Vascular/Lymphatic: Mild aortoiliac atherosclerotic disease. The IVC is unremarkable. No portal venous gas. No adenopathy. Reproductive: The uterus is grossly  unremarkable. No adnexal masses. Other: Ventral  hernia repair mesh. Musculoskeletal: Osteopenia with degenerative changes of the spine. No acute osseous pathology. IMPRESSION: 1. No acute intra-abdominal or pelvic pathology. 2. Sigmoid diverticulosis. No bowel obstruction. Normal appendix. 3. Bibasilar subpleural streaky and reticular atelectasis or infiltrate. Aspiration is not excluded clinical correlation is recommended. 4.  Aortic Atherosclerosis (ICD10-I70.0). Electronically Signed   By: Anner Crete M.D.   On: 07/04/2022 01:17   DG Abdomen 1 View  Result Date: 07/04/2022 CLINICAL DATA:  constipation EXAM: ABDOMEN - 1 VIEW COMPARISON:  CT angio chest 06/30/2022, CT abdomen pelvis 11/14/2010 FINDINGS: Limited evaluation due to overlapping osseous structures and overlying soft tissues. Stool throughout the ascending colon. The bowel gas pattern is normal. Calcified splenic artery within left upper quadrant again noted. No radio-opaque calculi or other significant radiographic abnormality are seen. Surgical tacks related to a ventral wall hernia repair noted overlying the abdomen and pelvis. IMPRESSION: Nonobstructive bowel gas pattern. Electronically Signed   By: Iven Finn M.D.   On: 07/16/2022 23:09   CT Angio Chest PE W and/or Wo Contrast  Result Date: 06/30/2022 CLINICAL DATA:  89 year old female with left side chest pain. Status post COVID-19 about 1 month ago. EXAM: CT ANGIOGRAPHY CHEST WITH CONTRAST TECHNIQUE: Multidetector CT imaging of the chest was performed using the standard protocol during bolus administration of intravenous contrast. Multiplanar CT image reconstructions and MIPs were obtained to evaluate the vascular anatomy. RADIATION DOSE REDUCTION: This exam was performed according to the departmental dose-optimization program which includes automated exposure control, adjustment of the mA and/or kV according to patient size and/or use of iterative reconstruction technique.  CONTRAST:  72mL OMNIPAQUE IOHEXOL 350 MG/ML SOLN COMPARISON:  Portable chest 06/22/2022.  CTA chest 12/06/2019. FINDINGS: Cardiovascular: Adequate contrast bolus timing in the pulmonary arterial tree. Mild respiratory motion. No pulmonary artery filling defect identified. Calcified aortic atherosclerosis. Heart size stable and within normal limits. No pericardial effusion. Only faint contrast in the aorta. Mediastinum/Nodes: Stable since 2021 and negative. No mediastinal lymphadenopathy. Lungs/Pleura: Similar lung volumes to 2021. Major airways remain patent. Dependent atelectasis and basilar predominant mild subpleural scarring, not significantly changed since 2021. Left lower lobe superior segment nodularity has cleared since the 2021 CTA. No pleural effusion or convincing pulmonary inflammation now. Upper Abdomen: Chronically absent gallbladder. Negative visible liver, adrenal glands. Simple fluid density and rounded hypodense area in the anterior spleen redemonstrated on series 4, image 88. Once again, this has a benign appearance, such as splenic cyst or hemangioma (no follow-up imaging recommended). Negative visible stomach aside from small chronic hiatal hernia or distal esophagus phrenic ampulla. No free air free fluid in the visible upper abdomen. Musculoskeletal: Osteopenia. 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 Chronic postoperative changes to the right humeral head. No acute osseous abnormality identified. Review of the MIP images confirms the above findings. IMPRESSION: 1. Negative for acute pulmonary embolus. 2. No acute or inflammatory process identified in the chest. Chronic pulmonary atelectasis and scarring. 3.  Aortic Atherosclerosis (ICD10-I70.0). Electronically Signed   By: Genevie Ann M.D.   On: 06/30/2022 04:34   DG Chest Port 1 View  Result Date: 06/22/2022 CLINICAL DATA:  COVID positive 1 month ago, cough EXAM: PORTABLE CHEST 1 VIEW COMPARISON:  05/18/2022  FINDINGS: Single frontal view of the chest demonstrates a stable cardiac silhouette. There has been near complete resolution of the retrocardiac airspace disease seen on prior study. Chronic scarring throughout the lungs again noted. No effusion or pneumothorax. No acute bony abnormalities. IMPRESSION: 1. Near complete  resolution of the retrocardiac airspace disease seen on prior exam. 2. Chronic background scarring.  No new process. Electronically Signed   By: Randa Ngo M.D.   On: 06/22/2022 15:40    Microbiology: No results found for this or any previous visit (from the past 240 hour(s)).   Labs: Basic Metabolic Panel: Recent Labs  Lab 07/15/22 0303 07/16/22 0446 07/17/22 0514 07/18/22 0210 07/18/2022 0659  NA 127* 126* 126* 124* 123*  K 5.6* 5.5* 5.8* 5.6* 5.8*  CL 94* 92* 92* 95* 87*  CO2 27 27 25 25 26   GLUCOSE 221* 196* 136* 139* 183*  BUN 29* 33* 39* 45* 56*  CREATININE 1.07* 1.08* 1.17* 1.38* 1.76*  CALCIUM 8.0* 8.3* 8.5* 8.1* 8.4*  MG  --   --   --   --  1.9   Liver Function Tests: Recent Labs  Lab 07/12/2022 0659  AST 37  ALT 26  ALKPHOS 75  BILITOT 0.6  PROT 5.9*  ALBUMIN 2.2*   No results for input(s): "LIPASE", "AMYLASE" in the last 168 hours. No results for input(s): "AMMONIA" in the last 168 hours. CBC: Recent Labs  Lab 07/17/22 0514 07/20/2022 0659  WBC 9.9 14.7*  HGB 7.8* 6.1*  HCT 24.9* 20.3*  MCV 90.2 93.1  PLT 272 136*   Cardiac Enzymes: Recent Labs  Lab 07/16/22 0446  CKTOTAL 445*   D-Dimer No results for input(s): "DDIMER" in the last 72 hours. BNP: Invalid input(s): "POCBNP" CBG: Recent Labs  Lab 07/18/22 2042 07/13/2022 0323 07/04/2022 0731 07/25/2022 1145 07/14/2022 1649  GLUCAP 180* 180* 202* 217* 156*   Anemia work up No results for input(s): "VITAMINB12", "FOLATE", "FERRITIN", "TIBC", "IRON", "RETICCTPCT" in the last 72 hours. Urinalysis    Component Value Date/Time   COLORURINE YELLOW 07/04/2022 0541   APPEARANCEUR CLEAR  07/04/2022 0541   LABSPEC >1.046 (H) 07/04/2022 0541   PHURINE 6.0 07/04/2022 0541   GLUCOSEU NEGATIVE 07/04/2022 0541   HGBUR SMALL (A) 07/04/2022 0541   BILIRUBINUR NEGATIVE 07/04/2022 0541   KETONESUR 5 (A) 07/04/2022 0541   PROTEINUR 30 (A) 07/04/2022 0541   UROBILINOGEN 0.2 12/05/2010 1740   NITRITE NEGATIVE 07/04/2022 0541   LEUKOCYTESUR TRACE (A) 07/04/2022 0541   Sepsis Labs Recent Labs  Lab 07/17/22 0514 07/06/2022 0659  WBC 9.9 14.7*    SIGNED:  Marylu Lund, MD  [facility] 07/20/2022, 5:53 PM  If 7PM-7AM, please contact night-coverage [email or web address] Password [email or web address]

## 2022-07-27 NOTE — Progress Notes (Signed)
   07/07/2022 1410  Spiritual Encounters  Type of Visit Initial  Care provided to: Pt and family  Referral source Family  Reason for visit Urgent spiritual support  OnCall Visit Yes  Interventions  Spiritual Care Interventions Made Established relationship of care and support;Compassionate presence;Reflective listening   Chaplain responded to a request from the family through the  Palliative team to call the patient's pastor. [place] on [address] in [place]. Chaplain called and left a message on the church office phone. Family requested chaplain to visit with patient. Family excused chaplain when they came in after a meeting with medical team.   Note prepared by Abbott Pao, Chaplain Resident [id number]

## 2022-07-27 NOTE — Progress Notes (Signed)
Patient's hemoglobin is 6.1, notified MD.

## 2022-07-27 NOTE — Progress Notes (Signed)
Called to assess patient desaturation. Upon  arrival patient on 100% FIO2 and 45L. Placed partial NRB due to patient mouth breathing. Currently 94%.

## 2022-07-27 NOTE — Progress Notes (Signed)
Patient's body tempeture is 93.9 rectal. Notified MD and palliative.

## 2022-07-27 DEATH — deceased

## 2022-08-07 ENCOUNTER — Inpatient Hospital Stay: Payer: Self-pay | Admitting: Internal Medicine
